# Patient Record
Sex: Male | Born: 1935
Health system: Southern US, Community
[De-identification: ages and names within clinical notes are randomized; demographics above are authoritative.]

## PROBLEM LIST (undated history)

## (undated) DIAGNOSIS — K5900 Constipation, unspecified: Secondary | ICD-10-CM

## (undated) DIAGNOSIS — I2510 Atherosclerotic heart disease of native coronary artery without angina pectoris: Secondary | ICD-10-CM

## (undated) DIAGNOSIS — I252 Old myocardial infarction: Secondary | ICD-10-CM

## (undated) DIAGNOSIS — E1121 Type 2 diabetes mellitus with diabetic nephropathy: Secondary | ICD-10-CM

## (undated) DIAGNOSIS — Z860100 Personal history of colon polyps, unspecified: Secondary | ICD-10-CM

## (undated) DIAGNOSIS — R609 Edema, unspecified: Secondary | ICD-10-CM

## (undated) DIAGNOSIS — H409 Unspecified glaucoma: Secondary | ICD-10-CM

## (undated) DIAGNOSIS — I639 Cerebral infarction, unspecified: Secondary | ICD-10-CM

## (undated) DIAGNOSIS — R55 Syncope and collapse: Secondary | ICD-10-CM

## (undated) DIAGNOSIS — R350 Frequency of micturition: Secondary | ICD-10-CM

## (undated) DIAGNOSIS — H3530 Unspecified macular degeneration: Secondary | ICD-10-CM

## (undated) DIAGNOSIS — R351 Nocturia: Secondary | ICD-10-CM

## (undated) DIAGNOSIS — N183 Chronic kidney disease, stage 3 unspecified: Secondary | ICD-10-CM

## (undated) DIAGNOSIS — J189 Pneumonia, unspecified organism: Secondary | ICD-10-CM

## (undated) DIAGNOSIS — E785 Hyperlipidemia, unspecified: Secondary | ICD-10-CM

## (undated) DIAGNOSIS — M2540 Effusion, unspecified joint: Secondary | ICD-10-CM

## (undated) DIAGNOSIS — M545 Low back pain, unspecified: Secondary | ICD-10-CM

## (undated) DIAGNOSIS — I10 Essential (primary) hypertension: Secondary | ICD-10-CM

## (undated) DIAGNOSIS — G4700 Insomnia, unspecified: Secondary | ICD-10-CM

## (undated) DIAGNOSIS — E119 Type 2 diabetes mellitus without complications: Secondary | ICD-10-CM

## (undated) DIAGNOSIS — Z9289 Personal history of other medical treatment: Secondary | ICD-10-CM

## (undated) DIAGNOSIS — G4730 Sleep apnea, unspecified: Secondary | ICD-10-CM

## (undated) DIAGNOSIS — R600 Localized edema: Secondary | ICD-10-CM

## (undated) DIAGNOSIS — G8929 Other chronic pain: Secondary | ICD-10-CM

## (undated) DIAGNOSIS — R091 Pleurisy: Secondary | ICD-10-CM

## (undated) DIAGNOSIS — I5042 Chronic combined systolic (congestive) and diastolic (congestive) heart failure: Secondary | ICD-10-CM

## (undated) DIAGNOSIS — M1990 Unspecified osteoarthritis, unspecified site: Secondary | ICD-10-CM

## (undated) DIAGNOSIS — H548 Legal blindness, as defined in USA: Secondary | ICD-10-CM

## (undated) DIAGNOSIS — M2550 Pain in unspecified joint: Secondary | ICD-10-CM

## (undated) HISTORY — PX: BACK SURGERY: SHX140

## (undated) HISTORY — PX: KNEE ARTHROSCOPY: SUR90

## (undated) HISTORY — PX: CATARACT EXTRACTION W/ INTRAOCULAR LENS  IMPLANT, BILATERAL: SHX1307

## (undated) HISTORY — PX: EYE SURGERY: SHX253

## (undated) HISTORY — PX: INSERTION / PLACEMENT / REVISION NEUROSTIMULATOR: SUR720

## (undated) HISTORY — PX: COLONOSCOPY: SHX174

## (undated) HISTORY — PX: GLAUCOMA SURGERY: SHX656

## (undated) HISTORY — PX: POSTERIOR FUSION LUMBAR SPINE: SUR632

---

## 2006-03-29 ENCOUNTER — Ambulatory Visit (HOSPITAL_COMMUNITY): Admission: RE | Admit: 2006-03-29 | Discharge: 2006-03-29 | Payer: Self-pay | Admitting: Ophthalmology

## 2006-10-29 ENCOUNTER — Encounter: Admission: RE | Admit: 2006-10-29 | Discharge: 2006-10-29 | Payer: Self-pay | Admitting: General Surgery

## 2006-12-04 ENCOUNTER — Inpatient Hospital Stay (HOSPITAL_COMMUNITY): Admission: RE | Admit: 2006-12-04 | Discharge: 2006-12-08 | Payer: Self-pay | Admitting: Orthopedic Surgery

## 2006-12-18 ENCOUNTER — Ambulatory Visit (HOSPITAL_BASED_OUTPATIENT_CLINIC_OR_DEPARTMENT_OTHER): Admission: RE | Admit: 2006-12-18 | Discharge: 2006-12-18 | Payer: Self-pay | Admitting: General Surgery

## 2006-12-18 ENCOUNTER — Encounter (INDEPENDENT_AMBULATORY_CARE_PROVIDER_SITE_OTHER): Payer: Self-pay | Admitting: General Surgery

## 2006-12-28 ENCOUNTER — Inpatient Hospital Stay (HOSPITAL_COMMUNITY): Admission: EM | Admit: 2006-12-28 | Discharge: 2006-12-31 | Payer: Self-pay | Admitting: Emergency Medicine

## 2007-05-15 ENCOUNTER — Ambulatory Visit (HOSPITAL_COMMUNITY): Admission: RE | Admit: 2007-05-15 | Discharge: 2007-05-15 | Payer: Self-pay | Admitting: Ophthalmology

## 2008-05-24 ENCOUNTER — Ambulatory Visit (HOSPITAL_COMMUNITY): Admission: RE | Admit: 2008-05-24 | Discharge: 2008-05-24 | Payer: Self-pay | Admitting: Ophthalmology

## 2008-08-20 ENCOUNTER — Encounter (HOSPITAL_BASED_OUTPATIENT_CLINIC_OR_DEPARTMENT_OTHER): Admission: RE | Admit: 2008-08-20 | Discharge: 2008-11-18 | Payer: Self-pay | Admitting: Internal Medicine

## 2008-09-07 ENCOUNTER — Ambulatory Visit (HOSPITAL_COMMUNITY): Admission: RE | Admit: 2008-09-07 | Discharge: 2008-09-07 | Payer: Self-pay | Admitting: Ophthalmology

## 2009-01-19 ENCOUNTER — Encounter (HOSPITAL_BASED_OUTPATIENT_CLINIC_OR_DEPARTMENT_OTHER): Admission: RE | Admit: 2009-01-19 | Discharge: 2009-02-09 | Payer: Self-pay | Admitting: Internal Medicine

## 2009-05-19 ENCOUNTER — Ambulatory Visit (HOSPITAL_COMMUNITY): Admission: RE | Admit: 2009-05-19 | Discharge: 2009-05-19 | Payer: Self-pay | Admitting: Ophthalmology

## 2009-08-25 ENCOUNTER — Encounter
Admission: RE | Admit: 2009-08-25 | Discharge: 2009-08-25 | Payer: Self-pay | Admitting: Physical Medicine and Rehabilitation

## 2009-09-23 ENCOUNTER — Inpatient Hospital Stay (HOSPITAL_COMMUNITY): Admission: EM | Admit: 2009-09-23 | Discharge: 2009-09-25 | Payer: Self-pay | Admitting: Emergency Medicine

## 2009-09-28 ENCOUNTER — Encounter: Admission: RE | Admit: 2009-09-28 | Discharge: 2009-09-28 | Payer: Self-pay | Admitting: Family Medicine

## 2010-07-11 ENCOUNTER — Other Ambulatory Visit: Payer: Self-pay | Admitting: Family Medicine

## 2010-08-22 LAB — COMPREHENSIVE METABOLIC PANEL
ALT: 19 U/L (ref 0–53)
ALT: 21 U/L (ref 0–53)
AST: 22 U/L (ref 0–37)
AST: 47 U/L — ABNORMAL HIGH (ref 0–37)
Albumin: 3 g/dL — ABNORMAL LOW (ref 3.5–5.2)
Albumin: 3.4 g/dL — ABNORMAL LOW (ref 3.5–5.2)
Alkaline Phosphatase: 45 U/L (ref 39–117)
Alkaline Phosphatase: 51 U/L (ref 39–117)
BUN: 32 mg/dL — ABNORMAL HIGH (ref 6–23)
BUN: 33 mg/dL — ABNORMAL HIGH (ref 6–23)
CO2: 24 mEq/L (ref 19–32)
CO2: 29 mEq/L (ref 19–32)
Calcium: 8.6 mg/dL (ref 8.4–10.5)
Calcium: 8.9 mg/dL (ref 8.4–10.5)
Chloride: 103 mEq/L (ref 96–112)
Chloride: 103 mEq/L (ref 96–112)
Creatinine, Ser: 1.73 mg/dL — ABNORMAL HIGH (ref 0.4–1.5)
Creatinine, Ser: 1.89 mg/dL — ABNORMAL HIGH (ref 0.4–1.5)
GFR calc Af Amer: 42 mL/min — ABNORMAL LOW (ref >60)
GFR calc Af Amer: 47 mL/min — ABNORMAL LOW (ref >60)
GFR calc non Af Amer: 35 mL/min — ABNORMAL LOW (ref >60)
GFR calc non Af Amer: 39 mL/min — ABNORMAL LOW (ref >60)
Glucose, Bld: 117 mg/dL — ABNORMAL HIGH (ref 70–99)
Glucose, Bld: 148 mg/dL — ABNORMAL HIGH (ref 70–99)
Potassium: 3.5 mEq/L (ref 3.5–5.1)
Potassium: 3.8 mEq/L (ref 3.5–5.1)
Sodium: 137 mEq/L (ref 135–145)
Sodium: 139 mEq/L (ref 135–145)
Total Bilirubin: 0.5 mg/dL (ref 0.3–1.2)
Total Bilirubin: 1 mg/dL (ref 0.3–1.2)
Total Protein: 6.4 g/dL (ref 6.0–8.3)
Total Protein: 6.8 g/dL (ref 6.0–8.3)

## 2010-08-22 LAB — TESTOSTERONE: Testosterone: 186.87 ng/dL — ABNORMAL LOW (ref 350–890)

## 2010-08-22 LAB — URINALYSIS, ROUTINE W REFLEX MICROSCOPIC
Bilirubin Urine: NEGATIVE
Glucose, UA: NEGATIVE mg/dL
Nitrite: POSITIVE — AB
Protein, ur: 30 mg/dL — AB
Specific Gravity, Urine: 1.02 (ref 1.005–1.030)
Urobilinogen, UA: 0.2 mg/dL (ref 0.0–1.0)
pH: 5 (ref 5.0–8.0)

## 2010-08-22 LAB — HEMOGLOBIN A1C
Hgb A1c MFr Bld: 7.4 % — ABNORMAL HIGH (ref <5.7)
Mean Plasma Glucose: 166 mg/dL — ABNORMAL HIGH (ref <117)

## 2010-08-22 LAB — CULTURE, BLOOD (ROUTINE X 2)
Culture: NO GROWTH
Culture: NO GROWTH

## 2010-08-22 LAB — GLUCOSE, CAPILLARY
Glucose-Capillary: 124 mg/dL — ABNORMAL HIGH (ref 70–99)
Glucose-Capillary: 124 mg/dL — ABNORMAL HIGH (ref 70–99)
Glucose-Capillary: 134 mg/dL — ABNORMAL HIGH (ref 70–99)
Glucose-Capillary: 156 mg/dL — ABNORMAL HIGH (ref 70–99)

## 2010-08-22 LAB — DIFFERENTIAL
Basophils Absolute: 0 10*3/uL (ref 0.0–0.1)
Basophils Relative: 0 % (ref 0–1)
Eosinophils Absolute: 0 10*3/uL (ref 0.0–0.7)
Eosinophils Relative: 0 % (ref 0–5)
Lymphocytes Relative: 3 % — ABNORMAL LOW (ref 12–46)
Lymphs Abs: 0.2 10*3/uL — ABNORMAL LOW (ref 0.7–4.0)
Monocytes Absolute: 0.3 10*3/uL (ref 0.1–1.0)
Monocytes Relative: 7 % (ref 3–12)
Neutro Abs: 4.6 10*3/uL (ref 1.7–7.7)
Neutrophils Relative %: 90 % — ABNORMAL HIGH (ref 43–77)

## 2010-08-22 LAB — CBC
HCT: 35.9 % — ABNORMAL LOW (ref 39.0–52.0)
HCT: 38.8 % — ABNORMAL LOW (ref 39.0–52.0)
Hemoglobin: 11.9 g/dL — ABNORMAL LOW (ref 13.0–17.0)
Hemoglobin: 12.9 g/dL — ABNORMAL LOW (ref 13.0–17.0)
MCHC: 33.1 g/dL (ref 30.0–36.0)
MCHC: 33.4 g/dL (ref 30.0–36.0)
MCV: 89.9 fL (ref 78.0–100.0)
MCV: 90.9 fL (ref 78.0–100.0)
Platelets: 116 10*3/uL — ABNORMAL LOW (ref 150–400)
Platelets: 141 10*3/uL — ABNORMAL LOW (ref 150–400)
RBC: 3.95 MIL/uL — ABNORMAL LOW (ref 4.22–5.81)
RBC: 4.31 MIL/uL (ref 4.22–5.81)
RDW: 13.7 % (ref 11.5–15.5)
RDW: 13.8 % (ref 11.5–15.5)
WBC: 5.2 10*3/uL (ref 4.0–10.5)
WBC: 8.4 10*3/uL (ref 4.0–10.5)

## 2010-08-22 LAB — HOMOCYSTEINE: Homocysteine: 12.1 umol/L (ref 4.0–15.4)

## 2010-08-22 LAB — URINE CULTURE: Colony Count: >=100000

## 2010-08-22 LAB — BASIC METABOLIC PANEL
BUN: 22 mg/dL (ref 6–23)
CO2: 28 mEq/L (ref 19–32)
Calcium: 8.6 mg/dL (ref 8.4–10.5)
Chloride: 104 mEq/L (ref 96–112)
Creatinine, Ser: 1.28 mg/dL (ref 0.4–1.5)
GFR calc Af Amer: >60 mL/min (ref >60)
GFR calc non Af Amer: 55 mL/min — ABNORMAL LOW (ref >60)
Glucose, Bld: 130 mg/dL — ABNORMAL HIGH (ref 70–99)
Potassium: 3.6 mEq/L (ref 3.5–5.1)
Sodium: 140 mEq/L (ref 135–145)

## 2010-08-22 LAB — RETICULOCYTES
RBC.: 4.22 MIL/uL (ref 4.22–5.81)
Retic Count, Absolute: 29.5 10*3/uL (ref 19.0–186.0)
Retic Ct Pct: 0.7 % (ref 0.4–3.1)

## 2010-08-22 LAB — URINE MICROSCOPIC-ADD ON

## 2010-08-22 LAB — INTRINSIC FACTOR ANTIBODIES: Intrinsic Factor: NEGATIVE

## 2010-08-22 LAB — VITAMIN B12: Vitamin B-12: 174 pg/mL — ABNORMAL LOW (ref 211–911)

## 2010-08-22 LAB — METHYLMALONIC ACID, SERUM: Methylmalonic Acid, Quantitative: 308 nmol/L (ref 87–318)

## 2010-08-22 LAB — LACTIC ACID, PLASMA: Lactic Acid, Venous: 1 mmol/L (ref 0.5–2.2)

## 2010-08-22 LAB — IRON AND TIBC
Iron: 23 ug/dL — ABNORMAL LOW (ref 42–135)
Saturation Ratios: 10 % — ABNORMAL LOW (ref 20–55)
TIBC: 231 ug/dL (ref 215–435)
UIBC: 208 ug/dL

## 2010-08-22 LAB — FOLATE: Folate: 15.1 ng/mL

## 2010-08-22 LAB — FERRITIN: Ferritin: 1099 ng/mL — ABNORMAL HIGH (ref 22–322)

## 2010-08-22 LAB — HEMOCCULT GUIAC POC 1CARD (OFFICE): Fecal Occult Bld: NEGATIVE

## 2010-08-22 LAB — CREATININE, URINE, RANDOM: Creatinine, Urine: 139.7 mg/dL

## 2010-08-22 LAB — ANTI-PARIETAL ANTIBODY: Parietal Cell Antibody-IgG: <=20 Unit (ref <=20.0)

## 2010-08-22 LAB — SODIUM, URINE, RANDOM: Sodium, Ur: 62 mEq/L

## 2010-09-04 LAB — BASIC METABOLIC PANEL
BUN: 19 mg/dL (ref 6–23)
CO2: 30 mEq/L (ref 19–32)
Calcium: 9.4 mg/dL (ref 8.4–10.5)
Chloride: 98 mEq/L (ref 96–112)
Creatinine, Ser: 1.12 mg/dL (ref 0.4–1.5)
GFR calc Af Amer: >60 mL/min (ref >60)
GFR calc non Af Amer: >60 mL/min (ref >60)
Glucose, Bld: 156 mg/dL — ABNORMAL HIGH (ref 70–99)
Potassium: 4.1 mEq/L (ref 3.5–5.1)
Sodium: 136 mEq/L (ref 135–145)

## 2010-09-04 LAB — CBC
HCT: 43.8 % (ref 39.0–52.0)
Hemoglobin: 14.8 g/dL (ref 13.0–17.0)
MCHC: 33.8 g/dL (ref 30.0–36.0)
MCV: 90.9 fL (ref 78.0–100.0)
Platelets: 181 10*3/uL (ref 150–400)
RBC: 4.81 MIL/uL (ref 4.22–5.81)
RDW: 13.3 % (ref 11.5–15.5)
WBC: 4.4 10*3/uL (ref 4.0–10.5)

## 2010-10-17 NOTE — Assessment & Plan Note (Signed)
[facility]   NAME:  ALLAN, MINOTTI             ACCOUNT NO.:  [id number]   MEDICAL RECORD NO.:  [id number]      DATE OF BIRTH:  [birth year]   PHYSICIAN:  Maxwell Caul, M.D. VISIT DATE:  08/23/2008                                   OFFICE VISIT   Mr. Richens is a pleasant 77 year old man, who [facility] for followup of a wound on his right lateral  heel/foot.  The patient tells me that his problem began 2 months ago.  He developed a small area on his right lateral foot.  This was not  particularly painful.  However, it progressed over the course of the  month to a fairly large open wound.  There was swelling and drainage at  the time.  He is not aware of what could have precipitated this.  He  specifically denies any trauma to the foot.  No particular trauma with  new foot wear, etc.  He was not persistently recumbent and there is no  reason to believe this was a decubitus ulcer.  He is not a diabetic.  He  applied various topical agents to this most recently, Bactroban and he  gradually got this to close over.  Roughly 2 weeks ago, he noted a very  pruritic rash predominately on his bilateral thighs and more recently on  his arms.  He has not been on any different medications.  Again, this is  not painful.  He has no joint problems and it does not particularly have  any new arthritis present.  He has no kidney problems that he is aware  of.   PAST MEDICAL HISTORY:  1. Chronic low back pain.  2. Hypertension.  3. Hyperlipidemia.  As mentioned, he is not a diabetic.  4. He has had a prior history of lumbar back surgery.   SOCIAL HISTORY:  The patient is not a smoker or alcohol use.   ALLERGIES:  He has no known medical allergies.   CURRENT MEDICATIONS:  1. Benicar/hydrochlorothiazide.  2. Voltaren.  3. Lovaza.  4. Neurontin (for chronic back pain).  5. Pravastatin 40 mg per day.  None of these are new additions.   PHYSICAL EXAMINATION:  VITAL SIGNS:  Temperature is 98.3, pulse 84,  respirations 16, and blood pressure 141/93.  RESPIRATORY:  Clear air entry bilaterally.  CARDIAC:  Heart sounds are normal.  There are no murmurs.  MUSCULOSKELETAL:  No evidence of active arthritis.  SKIN:  He indeed has a fine raised rash over the anterior parts of both  his thighs anterior and medially.  This is also present in his arms to a  lesser extent.  I did not appreciate this on his abdomen, chest, or  back.   Also, of interest, he has what looks to be a healed wound on the right  lateral heel.  This is fully epithelialized.  There is some darkish  discoloration in the middle aspect of this wound.  I am really not  certain whether this is hematoma at one point or it could represent  something more ominous than this, although for now, I am going to call  this healed ulcer of uncertain etiology.   Circulation, I could not feel his  dorsalis pedis, but his posterior  tibial pulses were brisk, and he has good capillary refill time.   IMPRESSION:  1. Right heel ulcer of completely uncertain etiology.  Reviewing this      with him historically, I just could not determine what this could      be or could have been.  The area is completely epithelialized,      although I am hesitant to call this totally resolved because of the      discoloration under the epithelial layer.  2. Extremely pruritic rash in his legs and to a lesser extent his      arms.  This does not look like a vasculitic rash and historically      only occurred 2 weeks ago.  This looks more like some form of      allergic reaction to me and/or some form of eczema rather than      anything that could have been connected in terms of pathophysiology      with the heel wound.  Nevertheless, I thought it prudent to have      this reviewed by a dermatologist, then he has been referred to see      Dr. Doristine Section of Dermatology.   I have made this  gentleman on appointment in 2 weeks' time to follow up  with the area on his right lateral heel.  Hopefully by that time, we  will have Dr. Joseph Art' input about the nature of his pruritic rash on his  legs and arms to make sure that he does not feel that there is any  connection between that and what was a fairly large wound on his right  lateral foot.  I did not recommend any specific dressing other than to  protect this area on his foot with a dry dressing at this point.           ______________________________  Maxwell Caul, M.D.     MGR/MEDQ  D:  08/23/2008  T:  08/23/2008  Job:  [id number]   cc:   Knox Royalty, MD

## 2010-10-17 NOTE — Op Note (Signed)
[name], [name]             ACCOUNT NO.:  [id number]   MEDICAL RECORD NO.:  [id number]          PATIENT TYPE:  INP   LOCATION:  5034                         FACILITY:  [facility]   PHYSICIAN:  Nelda Severe, MD      DATE OF BIRTH:  1930/09/02   DATE OF PROCEDURE:  12/04/2006  DATE OF DISCHARGE:                               OPERATIVE REPORT   SURGEON:  Dr. Nelda Severe.   ASSISTANT:  Lianne Cure, PA-C   PREOPERATIVE DIAGNOSIS:  L3-4 disk herniation right side, L4-5 spinal  stenosis.   POSTOPERATIVE DIAGNOSIS:  L3-4 lateral stenosis, foraminal stenosis; L4-  5 spinal stenosis.   OPERATIVE PROCEDURE:  Decompression right L3 and L4 nerve roots at L3-4  and L4-5 neural foramina.  Incidental right L5-S1 laminar foraminotomy  (see below).   The patient was placed under general endotracheal anesthesia.  A Foley  catheter was placed in the bladder.  Intravenous antibiotics were  administered prophylactically.  Sequential compression devices were  placed on both lower extremities.   The patient was positioned prone on Wagon Wheel table.  Care was taken to  position the upper extremities so as to avoid hyperflexion and abduction  of the shoulders and so as to avoid hyperflexion of the elbows.  The  upper extremities were padded with foam from axilla to hands.  Thighs,  knees, shins and ankles were supported on pillows.  The lumbar area was  prepped with DuraPrep and draped in square fashion.  The drapes were  secured with Ioban.   The lower lumbar incision was made a little right to the midline.  The  subcutaneous tissue paraspinal muscles were injected with a mixture of  quarter percent Marcaine with 1% lidocaine with epinephrine.  Dissection  was carried down through the thoracolumbar fascia and a very large  supraspinatus muscle bulged through the fascia.  I had intended to try  to approach the L3-4 level extraforaminally, the bulk of this muscle was  so much that I could not  really retract it medially.  Therefore we  separated the muscle medially from the spinous processes of the lower  lumbar spine.  Cross-table lateral radiograph was taken with a Kocher on  the spinous process of L4 for localization.   Subsequently a lamina foraminotomy and all of the facette joint  resection was done at what I perceived to be L4-5, but in fact was L3-4.  I could not identify the disk herniation at that level.  The L3 nerve  was completely decompressed.  I then moved one level proximally, still  in the belief that I had performed a laminar foraminotomy at L4-5, when  in fact it was L3-4.  I approached the level proximally extra  foraminally and cleared away soft tissue from between the transverse  processes of the adjacent vertebra and identified a nerve root at a disk  which did not appear to be herniated.  A needle was placed in the disk  and a cross-table lateral radiograph taken and this turned out to be L2-  3 disk, and at this point, I realized that my laminar  foraminotomy,  described above, was in fact at L3-4.  To verify this a needle was  placed in what turned out to be the L3-4 disk and a x-ray taken.   I then moved to do a laminar foraminotomy at L4-5 distally.  This was  accomplished without difficulty using a high-speed bur, Kerrison  rongeurs etc. but as the lateral foraminotomy was enlarged it became  apparent to me that we appeared to be at the last mobile segment.  X-ray  was taken which confirmed this.   In fact the situation was that the facette joint at L4-5 was overgrown  that I could not actually perceive the joint.  Ultimately the  laminectomy was extended from the L3-4 level through the 4 lamina and  down into L5.  A laminar foraminotomy was then performed L4-5 and  another radiograph taken to confirm the level.   Therefore in summary, at L2-3 an extraforaminal exposure of the disk was  performed, but nothing was really done.  L3-4 a right-sided  laminectomy  and facetectomy were performed and no disk pathology, other than a  bulging disk identified.  L4-5 laminar foraminotomy was performed and at  L5-S1 lateral foraminotomy was also performed.   There was a fair amount of oozing throughout the procedure, no real  epidural bleeding.  All identifiable bleeders were coagulated with  bipolar coagulation.  We placed a #15 gauge Blake drain in the sub  fascial layer.  The thoracolumbar fascia was reapposed using interrupted  figure-of-eight #1 Vicryl sutures and a one-eighth inch Hemovac drain  placed in the subcutaneous layer.  The subcutaneous layer was closed  using 2-0 interrupted Vicryl sutures.  Skin was closed using  subcuticular 3-0 undyed Vicryl in running fashion.  Both drains were  secured with 2-0 nylon in basket weave fashion.  The skin edges were  reinforced with Steri-Strips.  A nonadherent antibiotic ointment  dressing was applied and secured with OpSite.   Blood loss estimated at 200 to 300 mL.   Sponge and needle counts were correct.   Complications outlined was essentially inadvertent - incidental laminar  foraminotomy right L5-S1 without technical complication.      Nelda Severe, MD  Electronically Signed     MT/MEDQ  D:  12/04/2006  T:  12/05/2006  Job:  [id number]

## 2010-10-17 NOTE — Op Note (Signed)
NAMEJAHEL, WAVRA             ACCOUNT NO.:  [id number]   MEDICAL RECORD NO.:  [id number]          PATIENT TYPE:  AMB   LOCATION:  DSC                          FACILITY:  [facility]   PHYSICIAN:  Sharlet Salina T. Hoxworth, M.D.DATE OF BIRTH:  [birth year]   DATE OF PROCEDURE:  12/18/2006  DATE OF DISCHARGE:                               OPERATIVE REPORT   POSTOPERATIVE DIAGNOSIS:  Soft tissue mass, probable lipoma, posterior  neck.   SURGICAL PROCEDURE:  Excision soft tissue mass, posterior neck.   SURGEON:  Lorne Skeens. Hoxworth, M.D.   ANESTHESIA:  Local.   BRIEF HISTORY:  Mr. Lemmerman is a 77 year old male who presents with a  gradually enlarging and now uncomfortable subcutaneous lump just behind  the left ear overlying the mastoid head of the sternocleidomastoid  muscle.  Exam shows a 2.5 cm for fleshy discrete mass consistent with  lipoma.  After discussion we have elected proceed with excision under  local anesthesia.  Risks of bleeding, infection were discussed  understood.   DESCRIPTION OF OPERATION:  Patient brought to minor suite and positioned  comfortably right side down, the left neck sterilely prepped and draped.  Local anesthesia was used infiltrate the skin and underlying soft  tissue.  Made an incision in a skin crease directly overlying the mass  and dissection was carried down to the deep subcutaneous tissue.  There  was a lipomatous mass in this area but it was not particularly discrete,  multilobulated.  It was sharply dissected away from subcutaneous tissue  down to the level of the muscular fascia.  It was fairly adherent to the  fascia and was sharply dissected off of this and completely excised.  Complete hemostasis was assured.  The subcu was closed with interrupted  Monocryl and the skin closed with running subcuticular 4-0 Monocryl and  Dermabond.  The patient tolerated procedure well.      Lorne Skeens. Hoxworth, M.D.  Electronically Signed     BTH/MEDQ  D:  12/18/2006  T:  12/19/2006  Job:  [id number]

## 2010-10-17 NOTE — H&P (Signed)
[name], [name]             ACCOUNT NO.:  [id number]   MEDICAL RECORD NO.:  [id number]          PATIENT TYPE:  INP   LOCATION:  1528                         [facility]ACILITY:  [facility]   PHYSICIAN:  Hillery Aldo, M.D.   DATE OF BIRTH:  Nov 18, 1930   DATE OF ADMISSION:  12/28/2006  DATE OF DISCHARGE:                              HISTORY & PHYSICAL   PRIMARY CARE PHYSICIAN:  The patient is unassigned.   CHIEF COMPLAINT:  Weakness, dysuria, fever.   HISTORY OF PRESENT ILLNESS:  The patient is a 77 year old male who was  recently in the hospital on 12/18/2006 for excision of the soft tissue  mass that turned out to be a lipoma.  He was recuperating well  postoperatively but over the past several days, has experienced  increasing weakness and lethargy with diminished appetite.  He also has  developed some dysuria and had two episodes of urinary incontinence this  evening.  The patient's family became concerned when he developed  violent shaking chills and brought him to the ER for further evaluation.  Upon initial evaluation, he is found to have a fever of 104 degrees and  significant pyuria on urinalysis.  He is being admitted for  pyelonephritis with early sepsis.   PAST MEDICAL HISTORY:  1. Hypertension.  2. Spinal stenosis status post decompression surgery to L3/L4.  3. Glaucoma.  4. Cataracts status post lens implantation on the right  5. Remote right knee surgery.   FAMILY HISTORY:  The patient's mother died in her 8s from colon cancer.  The patient does not have any information regarding his father's medical  history but he is deceased.  He has two healthy sisters.  He has eight  children, two adopted, all healthy.   SOCIAL HISTORY:  The patient is married, lives with his spouse.  He has  a history of remote tobacco use, quit smoking over 20 years ago.  He was  a heavy user up until that time, smoking two to three packs daily.  He  denies any current alcohol use.  He  currently works full time with a IT sales professional.   ALLERGIES:  NO KNOWN DRUG ALLERGIES.   CURRENT MEDICATIONS:  1. Benicar 40/25 1 tablet daily.  2. Vicodin, 1-2 tablets q.4 h p.r.n.  3. Lyrica 75 mg daily  4. Cosopt 1 drop OU b.i.d.  5. Methocarbamol 500 mg 1-2 tablets q.6 h p.r.n.  6. Nabumetone  750 mg b.i.d.  7. Lumigan 1 drop in the right eye b.i.d.   REVIEW OF SYSTEMS:  The patient endorses fever and violently shaking  chills.  Diminished appetite.  No nausea or vomiting.  No chest pain,  shortness of breath or cough.  He has had some mild constipation.  No  melena or hematochezia.  He has had some dysuria and decreased energy  levels.   PHYSICAL EXAM:  T-max 104, T-current 100.9.  Pulse 118.  Respirations  20.  Blood pressure 104/62.  O2 saturation 94% on room air.  GENERAL:  A well-developed, well-nourished obese male in no acute  distress.  HEENT: Normocephalic, atraumatic.  PERRL.  EOMI.  Oropharynx is clear.  NECK:  Supple, no thyromegaly, no lymphadenopathy, no jugular venous  distension.  Surgical site well healed with wound edges well  approximated.  CHEST:  Lungs clear to auscultation bilaterally with good  air movement.  HEART:  Tachycardiac rate, regular rhythm.  No murmurs, rubs or gallops.  ABDOMEN:  Soft, nontender, nondistended.  Normoactive bowel sounds.  No  appreciable flank tenderness.  GENITOURINARY:  Unremarkable.  EXTREMITIES:  No clubbing, edema, cyanosis.  SKIN:  Diaphoretic and hot to touch.  NEUROLOGIC:  The patient is mildly disoriented.  Exam otherwise  nonfocal.   DATA REVIEW:  Chest x-ray shows borderline cardiac enlargement.  There  is mild chronic bronchitic changes and chronic accentuation of basilar  markings.  Stable.   LABORATORY DATA:  White blood cell count of 6.0, hemoglobin of 11.9,  hematocrit 34.7, platelets 218 with absolute neutrophil count of 5.3 and  MCV of 85.5.  Sodium is 135, potassium 3.7, chloride 102, bicarb 26,  BUN  36, creatinine 1.55, glucose 133.  Urinalysis is positive for nitrites  with a large amount of leukocyte esterase.  There are too numerous to  count white blood cells, three to six red blood cells and many bacteria.   ASSESSMENT/PLAN:  1. Pyelonephritis with early sepsis:  We will admit the patient for IV      antibiotics.  He has been started on Rocephin in the emergency      department which we will continue pending culture and sensitivity      data.  Given his relative hypotension, will hold his      antihypertensive treatment.  Will administer Tylenol for fevers      p.r.n..  Will follow up with his cultures and follow his clinical      course closely.  2. Hypertension:  Again, the patient is currently relatively      hypotensive.  Will start him on IV fluid rehydration and hold his      antihypertensive medications.  3. Mild normocytic anemia:  The patient has a mild normocytic anemia.      Will defer workup for this to his regular physician.  4. Glaucoma:  Continue the patient's eyedrops.  5. Prophylaxis:  Initiate GI prophylaxis with Protonix and DVT      prophylaxis with Lovenox.      Hillery Aldo, M.D.  Electronically Signed     CR/MEDQ  D:  12/28/2006  T:  12/29/2006  Job:  [id number]

## 2010-10-17 NOTE — Discharge Summary (Signed)
NAMEJAZON, [name]             ACCOUNT NO.:  [id number]   MEDICAL RECORD NO.:  [id number]          PATIENT TYPE:  INP   LOCATION:  1528                         [facility]AC[facility]:  [facility]   PHYSICIAN:  Beckey Rutter, MD  DATE OF BIRTH:  [birth year]   DATE OF ADMISSION:  12/28/2006  DATE OF DISCHARGE:  12/31/2006                               DISCHARGE SUMMARY   CHIEF COMPLAINT:  Weakness, dysuria, and fevers.   HISTORY OF PRESENT ILLNESS:  Briefly this is a 77 year old admitted for  sepsis and UTI.   HOSPITAL COURSE:  1,  SEPSIS, RESOLVED.  The patient was continued on  antibiotic ciprofloxacin.  His urine culture, the one first sent on the  day of admission, was not obtainable, and the repeat culture was showing  negative results after three days of antibiotic.  The patient is still  for discharge today to continue on antibiotic.  Will switch his  antibiotic to Augmentin for five more days.  1. HYPERTENSION, STABLE.  Mild normocytic anemia is stable during this      hospitalization.  2. GLAUCOMA.  The patient will be discharged to continue on his eye      drops as before presentation.   DISCHARGE DIAGNOSES:  1. Sepsis, likely secondary to pyelonephritis.  2. Hypertension.  3. Mild normocytic anemia.  4. Glaucoma.  5. Overweight.   DISCHARGE MEDICATIONS:  1. Augmentin 875 mg p.o. b.i.d. for five days.  2. Benicar 40/25 mg p.o. daily.  3. Vicodin one capsule p.r.n.  4. Lyrica 75 mg daily.  5. Cosopt eye drops b.i.d.  6. Methocarbamol 500 mg every six hours p.r.n.  7. Lumigan one drop each eye.  8. Nabumetone 750 mg twice a day.   PLAN:  The patient was discharged today to follow up with his primary as  discussed.      Beckey Rutter, MD  Electronically Signed     EME/MEDQ  D:  12/31/2006  T:  01/01/2007  Job:  [id number]

## 2010-10-17 NOTE — Assessment & Plan Note (Signed)
[facility]   NAME:  MATTIAS, WALMSLEY             ACCOUNT NO.:  [id number]   MEDICAL RECORD NO.:  [id number]      DATE OF BIRTH:  Feb 09, 1931   PHYSICIAN:  Maxwell Caul, M.D. VISIT DATE:  01/20/2009                                   OFFICE VISIT   Mr. Wainer is a gentleman we saw last week.  He presented with a wound  on his right lateral foot.  He also had an intensely pruritic rash over  the anterior parts of both his thighs and to a lesser extend his arms.  The wound when I saw him last week on the right lateral foot had healed.  It was fully epithelialized.   I sent the patient to see Dermatology.  He was kindly seen by Dr. Joseph Art.  The patient was felt to have nummular eczema as well as lichen simplex  chronicus.  He was prescribed a tapering course of prednisone from 20  mg, also triamcinolone.   On examination, his rash on his legs is considerably better.  We did  give him some Ultra Mide to use as well which might be helpful.  The  wound on his right lateral foot has continued to epithelialize and I  would call this resolved at this point.   IMPRESSION:  1. Wound right lateral foot.  This was probably a primary skin problem      with perhaps trauma from scratching.  However, the area has      completely resolved at this point.  2. Primary skin diagnosis as noted above.  This seems much improved.           ______________________________  Maxwell Caul, M.D.     MGR/MEDQ  D:  01/20/2009  T:  01/21/2009  Job:  [id number]

## 2010-10-20 NOTE — Discharge Summary (Signed)
[name], [name]             ACCOUNT NO.:  [id number]   MEDICAL RECORD NO.:  [id number]          PATIENT TYPE:  INP   LOCATION:  1528                         [facility]ACILITY:  [facility]   PHYSICIAN:  Beckey Rutter, MD  DATE OF BIRTH:  30-May-1931   DATE OF ADMISSION:  12/28/2006  DATE OF DISCHARGE:  12/31/2006                               DISCHARGE SUMMARY   [name] was admitted to [facility] for surgery by Dr.  Alveda Reasons on December 04, 2006.   ADMITTING DIAGNOSIS:  L3-4 disc herniation.  L4-5 stenosis.   SURGICAL PROCEDURE:  Right L4-5 and L3-4 laminectomy with foraminotomy  and right foraminotomy at L5-S1.   BRIEF HISTORY OF STAY:  Postoperatively the patient was stable and  afebrile with active range of motion of bilateral lower extremities in  the postoperative care unit.  Hemoglobin stable at 12.4, white count  6.1, basic metabolic panel within normal limits.  The patient stated  that the right leg is feeling tingly but not painful.  Potassium 3.4.  Postoperative day 2 the patient was doing well with no new complaints.  Hemoglobin stable at 12.6, white count 6.9, potassium 3.4, afebrile.  Active range of motion and strength intact grossly.  Bilateral lower  extremities equal.  Incision site clean and dry.  Drain was  discontinued.  Calves were soft and nontender.  PCA was discontinued.  IV fluids were hep-locked.  We discontinued the Foley.  Postoperative  day 3 the patient has tingling in his legs, nonspecific to nerve pattern  and it is an on and off sensation.  He is afebrile.  Hemoglobin stable  at 12.2.  White count 6.3.  Potassium 3.3, basic metabolic within normal  limits.  Incision is clean, dry and intact.  No active drainage.  No  abnormal erythema.  Distally, bilateral lower extremities grossly  neurovascularly motor intact.  We did have a consult with physical  therapy.  The patient is independent and safe with ambulation and  mobility.  On December 08, 2006 the  patient was stable, afebrile, independent  with bed mobility and ambulation.  Incision is clean, dry and intact.  Distally neurovascularly motor intact bilateral lower extremities.  [facility] was set up for Durable Medical Equipment to be  delivered to the patient's home.  The patient was discharged home in a  stable condition.   DISCHARGE DIAGNOSES:  Status post lumbar laminectomy at L3-4, L4-5 and  L5-S1 on the right side.   PREOPERATIVE LABS:  Include a white cell count of 4.4 on November 05, 2006,  hemoglobin 14.9, hematocrit 44, platelet count 205.  Sodium on December 02, 2006 was 141, potassium 3.9, chloride 103, CO2 32, glucose 104, BUN 16  and creatinine 0.9   DISCHARGE PLAN:  He is being sent home with pain medication Norco 10/325  mg 1 to 2 every 4 hours as needed for pain control.  He is going to  followup with Dr. Nelda Severe in the office in approximately 4 weeks.  We also  gave him prescription for Robaxin 500 mg 1 to 2 every  6 hours p.r.n. for  muscle spasms.  He is going to continue with his preoperative home  medications that he was on prior to his surgical procedure.  He is going  to walk for exercise.  No bending, stooping or squatting.  Diet is  regular.  No dressings needed.  He may shower.      Lianne Cure, P.A.      Beckey Rutter, MD  Electronically Signed    MC/MEDQ  D:  01/31/2007  T:  01/31/2007  Job:  [id number]

## 2010-10-20 NOTE — Op Note (Signed)
[name], [name]             ACCOUNT NO.:  [id number]   MEDICAL RECORD NO.:  [id number]          PATIENT TYPE:  AMB   LOCATION:  SDS                          FACILITY:  [facility]   PHYSICIAN:  Salley Scarlet., M.D.DATE OF BIRTH:  [birth year]   DATE OF PROCEDURE:  05/17/2007  DATE OF DISCHARGE:  05/15/2007                               OPERATIVE REPORT   PREOPERATIVE DIAGNOSIS:  Immature cataract, left eye.   POSTOPERATIVE DIAGNOSIS:  Immature cataract, left eye.   OPERATION:  Kelman phacoemulsification of cataract, left eye.   ANESTHESIA:  Local using Xylocaine 2% with Marcaine 0.75% and Wydase.   JUSTIFICATION FOR PROCEDURE:  This is a 77 year old gentleman who  underwent a cataract extraction of the right eye approximately a year or  2 ago with good visual result.  However, he is still complaining of  difficulty seeing to read and drive at night.  He was evaluated and  found to have a visual acuity best corrected to 20/50 on the left with a  posterior subcapsular cataract present.  Cataract extraction with  intraocular lens implantation was recommended.  He is admitted at this  time for that purpose.   PROCEDURE:  Under the influence of IV sedation, the Van Lint akinesia  and retrobulbar anesthesia was given.  The patient was prepped and  draped in the usual manner.  The lid speculum was inserted under the  upper and lower lid of the left eye and a 4-0 silk traction suture was  passed through the belly of the superior rectus muscle for traction.  A  fornix-based conjunctival flap was turned and hemostasis was achieved by  using cautery.  An incision was made in the sclera at the limbus.  This  incision was dissected down to clear cornea using a crescent blade.  A  sideport incision was made at the 1:30 o'clock position.  OcuCoat was  injected into the eye through the sideport incision.  The anterior  chamber was then entered through the corneoscleral tunnel incision at  the 11:30 o'clock position.  An anterior capsulotomy was done using a  bent 25-gauge needle.  The nucleus was hydrodissected using Xylocaine.  The KEP handpiece was passed into the eye and the nucleus was emulsified  without difficulty.  The residual cortical material was aspirated.  The  posterior capsule was polished using the olive tip polisher.  The wound  was widened slightly to accommodate a foldable silicone lens.  The lens  was seated into the eye behind the iris without difficulty.  The  anterior chamber was reformed and the pupil was constricted using  Miochol.  The lips of the wound were hydrated and tested to make sure  there was no leak.  After ascertaining that there was no leak, the  conjunctiva was closed over the wound using thermal cautery.  One  milliliters of Celestone and 0.5 mL of gentamicin were injected  subconjunctivally.  Maxitrol ophthalmic ointment and Pilopine were  applied along with a patch and Fox shield.  The patient tolerated the  procedure well and was discharged to the postanesthesia recovery  room  in satisfactory condition.  He was instructed to rest today, to  take Vicodin every 4 days as needed for pain and to see me in office  tomorrow for further evaluation.   DISCHARGE DIAGNOSIS:  Immature cataract, left eye.      Salley Scarlet., M.D.  Electronically Signed     TB/MEDQ  D:  05/17/2007  T:  05/19/2007  Job:  [id number]

## 2010-10-20 NOTE — Op Note (Signed)
NAMEKRISTOPHER, [name]             ACCOUNT NO.:  [id number]   MEDICAL RECORD NO.:  [id number]          PATIENT TYPE:  AMB   LOCATION:  SDS                          FACILITY:  [facility]   PHYSICIAN:  Salley Scarlet., M.D.DATE OF BIRTH:  1930/11/13   DATE OF PROCEDURE:  DATE OF DISCHARGE:  03/29/2006                               OPERATIVE REPORT   PREOPERATIVE DIAGNOSIS:  Immature cataract right eye.   POSTOPERATIVE DIAGNOSIS:  Immature cataract right eye.   OPERATION:  Kelman phacoemulsification cataract right eye.   ANESTHESIA:  Local using Xylocaine 2% with Marcaine 0.75% and Wydase.   JUSTIFICATION FOR PROCEDURE:  This is a 77 year old gentleman with a  history of glaucoma who complains of blurring vision with difficulty  seeing to read and drive.  He particularly has trouble seeing to drive  at night and in the bright sunlight.  He was evaluated and found to have  visual acuity best corrected to 20/80 on the right and 20/60 on the  left.  There were bilateral posterior subcapsular cataracts, slightly  worse on the right than the left.  Cataract extraction with intraocular  lens implantation was recommended.  He is admitted at this time for that  purpose.   PROCEDURE:  Under the influence of IV sedation a Val Lint akinesia and  retrobulbar anesthesia were given.  The patient was prepped and draped  in the usual manner.  The lid speculum was inserted under the upper and  lower lid of the right eye, and a 4-0 silk traction suture was passed  through the belly of the superior rectus muscle for traction.  A fornix-  based conjunctival flap was turned, and hemostasis was achieved by using  cautery.  A groove incision made in the cornea at the limbus.  This  incision was dissected down into the cornea using a crescent blade.  A  sideport incision was made at the 1:30 o'clock position.  Ocucoat was  injected into the eye through the sideport incision.  The anterior  chamber was  then entered through the corneoscleral tunnel incision at  the 11:30 o'clock position.  Anterior capsulotomy was done using a bent  25-gauge needle.  The nucleus was hydrodissected using Xylocaine.  The  KPE handpiece was passed into the eye, and the nucleus was emulsified  without difficulty.  The residual cortical material was aspirated.  The  posterior capsule was polished using an olive-tipped polisher.  The  wound was widened slightly to accommodate a foldable silicone lens.  This lens was seated into the eye behind the iris without difficulty.  The anterior chamber was re-formed and the pupils constricted using  Miochol.  The residual ocular was aspirated from the eye.  The lips of  the wound were hydrated and tested to make sure that there was no leak.  After ascertaining that there was no leak, the conjunctiva was closed  using thermal cautery.  One cc of Celestone and 0.5 cc of gentamicin  were injected subconjunctivally.  Maxitrol ophthalmic ointment and  povidone ointment were applied along with a patch and Fox shield.  The  patient tolerated the procedure well and was  discharged to the postanesthesia recovery room in satisfactory  condition.  He is instructed to rest today, to take Vicodin q.4 h. as  needed for pain, and to see me in the office tomorrow for further  evaluation.   DISCHARGE DIAGNOSIS:  Immature cataract right eye.      Salley Scarlet., M.D.  Electronically Signed     TB/MEDQ  D:  04/01/2006  T:  04/01/2006  Job:  [id number]

## 2010-12-13 ENCOUNTER — Ambulatory Visit (HOSPITAL_COMMUNITY)
Admission: RE | Admit: 2010-12-13 | Discharge: 2010-12-13 | Disposition: A | Discharge status: Home / Self Care | Payer: Medicare Other | Source: Ambulatory Visit | Attending: Ophthalmology | Admitting: Ophthalmology

## 2010-12-13 ENCOUNTER — Ambulatory Visit (HOSPITAL_COMMUNITY): Payer: Medicare Other

## 2010-12-13 DIAGNOSIS — I10 Essential (primary) hypertension: Secondary | ICD-10-CM | POA: Insufficient documentation

## 2010-12-13 DIAGNOSIS — Z01818 Encounter for other preprocedural examination: Secondary | ICD-10-CM | POA: Insufficient documentation

## 2010-12-13 DIAGNOSIS — Z961 Presence of intraocular lens: Secondary | ICD-10-CM | POA: Insufficient documentation

## 2010-12-13 DIAGNOSIS — H4011X0 Primary open-angle glaucoma, stage unspecified: Secondary | ICD-10-CM | POA: Insufficient documentation

## 2010-12-13 DIAGNOSIS — E669 Obesity, unspecified: Secondary | ICD-10-CM | POA: Insufficient documentation

## 2010-12-13 DIAGNOSIS — H43819 Vitreous degeneration, unspecified eye: Secondary | ICD-10-CM | POA: Insufficient documentation

## 2010-12-13 DIAGNOSIS — E119 Type 2 diabetes mellitus without complications: Secondary | ICD-10-CM | POA: Insufficient documentation

## 2010-12-13 DIAGNOSIS — Z01812 Encounter for preprocedural laboratory examination: Secondary | ICD-10-CM | POA: Insufficient documentation

## 2010-12-13 DIAGNOSIS — H47329 Drusen of optic disc, unspecified eye: Secondary | ICD-10-CM | POA: Insufficient documentation

## 2010-12-13 DIAGNOSIS — Z01811 Encounter for preprocedural respiratory examination: Secondary | ICD-10-CM

## 2010-12-13 DIAGNOSIS — H04129 Dry eye syndrome of unspecified lacrimal gland: Secondary | ICD-10-CM | POA: Insufficient documentation

## 2010-12-13 DIAGNOSIS — Z01810 Encounter for preprocedural cardiovascular examination: Secondary | ICD-10-CM | POA: Insufficient documentation

## 2010-12-13 LAB — SURGICAL PCR SCREEN
MRSA, PCR: NEGATIVE
Staphylococcus aureus: NEGATIVE

## 2010-12-13 LAB — GLUCOSE, CAPILLARY
Glucose-Capillary: 97 mg/dL (ref 70–99)
Glucose-Capillary: 88 mg/dL (ref 70–99)
Glucose-Capillary: 105 mg/dL — ABNORMAL HIGH (ref 70–99)

## 2010-12-13 LAB — CBC
HCT: 37.9 % — ABNORMAL LOW (ref 39.0–52.0)
Hemoglobin: 12.6 g/dL — ABNORMAL LOW (ref 13.0–17.0)
MCH: 28.6 pg (ref 26.0–34.0)
MCHC: 33.2 g/dL (ref 30.0–36.0)
MCV: 85.9 fL (ref 78.0–100.0)
Platelets: 172 10*3/uL (ref 150–400)
RBC: 4.41 MIL/uL (ref 4.22–5.81)
RDW: 13.6 % (ref 11.5–15.5)
WBC: 6 10*3/uL (ref 4.0–10.5)

## 2010-12-13 LAB — BASIC METABOLIC PANEL
BUN: 13 mg/dL (ref 6–23)
CO2: 28 mEq/L (ref 19–32)
Calcium: 9.2 mg/dL (ref 8.4–10.5)
Chloride: 103 mEq/L (ref 96–112)
Creatinine, Ser: 0.98 mg/dL (ref 0.50–1.35)
GFR calc Af Amer: >60 mL/min (ref >60)
GFR calc non Af Amer: >60 mL/min (ref >60)
Glucose, Bld: 94 mg/dL (ref 70–99)
Potassium: 3.9 mEq/L (ref 3.5–5.1)
Sodium: 139 mEq/L (ref 135–145)

## 2010-12-14 NOTE — Op Note (Signed)
[name], HOEFFNER             ACCOUNT NO.:  [id number]  MEDICAL RECORD NO.:  [id number]  LOCATION:  SDSC                         FACILITY:  [facility]  PHYSICIAN:  Chalmers Guest, M.D.     DATE OF BIRTH:  February 01, 1931  DATE OF PROCEDURE:  12/13/2010 DATE OF DISCHARGE:                              OPERATIVE REPORT   PREOPERATIVE DIAGNOSIS:  Uncontrolled primary open-angle glaucoma, right eye despite maximum tolerated medical therapy and laser treatment.  SURGICAL PROCEDURES:  Trabeculectomy with mitomycin C.  POSTOPERATIVE DIAGNOSIS:  Uncontrolled primary open-angle glaucoma, right eye despite maximum tolerated medical therapy and laser treatment.  COMPLICATIONS:  None.  ANESTHESIA:  2% Xylocaine with epinephrine in a 50/50 mixture of 0.75% Marcaine with ampule of Wydase.  PROCEDURE:  The patient was taken to the operating room under monitored anesthesia.  He was given a peribulbar block with the aforementioned local anesthetic agent.  Following this, the patient's face was prepped and draped in the usual sterile fashion with the surgeon sitting superiorly.  The operating microscope was put into position and while focusing it was noted that the operating microscope would not change magnification on the foot pedal.  I was able to focus the scope and move the X, Y, Z.  I asked the nursing staff to check the scope.  They checked the scope, we changed the pedal, but it would not change magnification, therefore I proceeded to operate using a 6-0 nylon suture passed through clear cornea to protect the eye.  The patient by the way had preexisting scarring from previous cataract surgery.  The scar was noted at 12 o'clock position.  A Hoskin forceps was used to grasp conjunctiva at the limbus.  Westcott scissors were used to make an incision.  Blunt Westcott scissor used to dissect posteriorly spreading under the conjunctiva.  Bleeding was controlled by recessing Tenon fibers and with  cautery.  At this point, they informed me that there was no mitomycin C had not been ordered the day before as normal protocol at hospital; however, they have requested that the pharmacy send the mitomycin C.  Therefore we proceeded with the procedure.  Using a 45- degree blade, a partial-thickness scleral flap was created using a forceps.  Using a 5700 blade, dissection was made up to identify the corneoscleral limbus.  At this point, we stopped again waiting for the mitomycin C.  The mitomycin C had still not arrived from the pharmacy. Therefore we retested the microscope, we changed the microscope pedal, and the microscope would still not to change magnification, therefore the entire case had to be done at high magnification.  We called the pharmacy and we were informed that the pharmacist was on the way to the operating room with the mitomycin C.  After waiting approximately 8-10 minutes, the mitomycin C arrived.  Mitomycin C 0.4 mg/mL was placed on a Gelfoam sponge.  The Gelfoam sponge was then placed under the conjunctiva and allowed to stay on the eye for 2 minutes and then removed and irrigated with 60 mL of balanced salt solution.  A Wheeler blade was then used through clear cornea at the 7:30 position to enter the eye.  A cannula was  placed to ensure patency.  Following this, scleral flap was elevated.  45-degree blade was used to enter the anterior chamber through the scleral flap and 1 x 3 block of tissue was removed with the test maze sponge.  Four interrupted 10-0 nylon sutures were placed to secure the scleral flap.  The conjunctiva was then sutured with a 9-0 nylon on a BV 100 needle.  A suture was placed on a each vein and a suture was placed in the middle.  BSS was injected in the anterior chamber.  The chamber deepened.  The bleb elevated. The incision was Seidel, staining with fluorescein, therefore subconjunctival injection of Kenalog 4 mg was given in the  inferior conjunctiva.  Topical atropine and then TobraDex ointment were placed on the eye.  A patch and Fox shield were placed and the patient returned to recovery area in stable condition.  Complications were none.     Chalmers Guest, M.D.     RW/MEDQ  D:  12/13/2010  T:  12/14/2010  Job:  [id number]  Electronically Signed by Chalmers Guest M.D. on 12/14/2010 06:32:35 PM

## 2011-02-20 ENCOUNTER — Encounter (HOSPITAL_COMMUNITY)
Admission: RE | Admit: 2011-02-20 | Discharge: 2011-02-20 | Disposition: A | Discharge status: Home / Self Care | Payer: Medicare Other | Source: Ambulatory Visit | Attending: Ophthalmology | Admitting: Ophthalmology

## 2011-02-20 LAB — CBC
HCT: 40.6 % (ref 39.0–52.0)
Hemoglobin: 13.5 g/dL (ref 13.0–17.0)
MCH: 28.3 pg (ref 26.0–34.0)
MCHC: 33.3 g/dL (ref 30.0–36.0)
MCV: 85.1 fL (ref 78.0–100.0)
Platelets: 182 10*3/uL (ref 150–400)
RBC: 4.77 MIL/uL (ref 4.22–5.81)
RDW: 13.7 % (ref 11.5–15.5)
WBC: 5.3 10*3/uL (ref 4.0–10.5)

## 2011-02-20 LAB — BASIC METABOLIC PANEL
BUN: 22 mg/dL (ref 6–23)
CO2: 29 mEq/L (ref 19–32)
Calcium: 9.8 mg/dL (ref 8.4–10.5)
Chloride: 101 mEq/L (ref 96–112)
Creatinine, Ser: 1.3 mg/dL (ref 0.50–1.35)
GFR calc Af Amer: >60 mL/min (ref >60)
GFR calc non Af Amer: 54 mL/min — ABNORMAL LOW (ref >60)
Glucose, Bld: 130 mg/dL — ABNORMAL HIGH (ref 70–99)
Potassium: 4.6 mEq/L (ref 3.5–5.1)
Sodium: 138 mEq/L (ref 135–145)

## 2011-02-20 LAB — SURGICAL PCR SCREEN
MRSA, PCR: NEGATIVE
Staphylococcus aureus: NEGATIVE

## 2011-02-28 ENCOUNTER — Ambulatory Visit (HOSPITAL_COMMUNITY)
Admission: RE | Admit: 2011-02-28 | Discharge: 2011-02-28 | Disposition: A | Discharge status: Home / Self Care | Payer: Medicare Other | Source: Ambulatory Visit | Attending: Ophthalmology | Admitting: Ophthalmology

## 2011-02-28 DIAGNOSIS — Y834 Other reconstructive surgery as the cause of abnormal reaction of the patient, or of later complication, without mention of misadventure at the time of the procedure: Secondary | ICD-10-CM | POA: Insufficient documentation

## 2011-02-28 DIAGNOSIS — H409 Unspecified glaucoma: Secondary | ICD-10-CM | POA: Insufficient documentation

## 2011-02-28 DIAGNOSIS — E119 Type 2 diabetes mellitus without complications: Secondary | ICD-10-CM | POA: Insufficient documentation

## 2011-02-28 DIAGNOSIS — E669 Obesity, unspecified: Secondary | ICD-10-CM | POA: Insufficient documentation

## 2011-02-28 DIAGNOSIS — Z01812 Encounter for preprocedural laboratory examination: Secondary | ICD-10-CM | POA: Insufficient documentation

## 2011-02-28 DIAGNOSIS — IMO0002 Reserved for concepts with insufficient information to code with codable children: Secondary | ICD-10-CM | POA: Insufficient documentation

## 2011-02-28 DIAGNOSIS — I10 Essential (primary) hypertension: Secondary | ICD-10-CM | POA: Insufficient documentation

## 2011-02-28 DIAGNOSIS — Z9889 Other specified postprocedural states: Secondary | ICD-10-CM | POA: Insufficient documentation

## 2011-03-01 LAB — GLUCOSE, CAPILLARY: Glucose-Capillary: 96 mg/dL (ref 70–99)

## 2011-03-12 LAB — CBC
HCT: 43.7
Hemoglobin: 14.9
MCHC: 34.1
MCV: 85.4
Platelets: 207
RBC: 5.11
RDW: 13.8
WBC: 5.1

## 2011-03-12 LAB — URINALYSIS, ROUTINE W REFLEX MICROSCOPIC
Bilirubin Urine: NEGATIVE
Glucose, UA: NEGATIVE
Hgb urine dipstick: NEGATIVE
Ketones, ur: NEGATIVE
Nitrite: NEGATIVE
Protein, ur: NEGATIVE
Specific Gravity, Urine: 1.025
Urobilinogen, UA: 1
pH: 6

## 2011-03-12 LAB — BASIC METABOLIC PANEL
BUN: 15
CO2: 30
Calcium: 9.6
Chloride: 100
Creatinine, Ser: 1.13
GFR calc Af Amer: >60
GFR calc non Af Amer: >60
Glucose, Bld: 100 — ABNORMAL HIGH
Potassium: 3.9
Sodium: 137

## 2011-03-19 LAB — HEPATIC FUNCTION PANEL
ALT: 26
AST: 25
Albumin: 2.6 — ABNORMAL LOW
Alkaline Phosphatase: 44
Bilirubin, Direct: 0.1
Indirect Bilirubin: 0.5
Total Bilirubin: 0.6
Total Protein: 5.9 — ABNORMAL LOW

## 2011-03-19 LAB — CULTURE, BLOOD (ROUTINE X 2)
Culture: NO GROWTH
Culture: NO GROWTH

## 2011-03-19 LAB — CBC
HCT: 31.1 — ABNORMAL LOW
HCT: 34.7 — ABNORMAL LOW
Hemoglobin: 10.8 — ABNORMAL LOW
Hemoglobin: 11.9 — ABNORMAL LOW
MCHC: 34.2
MCHC: 34.8
MCV: 85.3
MCV: 85.5
Platelets: 192
Platelets: 218
RBC: 3.64 — ABNORMAL LOW
RBC: 4.06 — ABNORMAL LOW
RDW: 13.5
RDW: 14.1 — ABNORMAL HIGH
WBC: 4.7
WBC: 6

## 2011-03-19 LAB — URINALYSIS, ROUTINE W REFLEX MICROSCOPIC
Bilirubin Urine: NEGATIVE
Glucose, UA: NEGATIVE
Nitrite: POSITIVE — AB
Protein, ur: 100 — AB
Specific Gravity, Urine: 1.02
Urobilinogen, UA: 1
pH: 5.5

## 2011-03-19 LAB — COMPREHENSIVE METABOLIC PANEL
ALT: 30
AST: 46 — ABNORMAL HIGH
Albumin: 2.9 — ABNORMAL LOW
Alkaline Phosphatase: 54
BUN: 32 — ABNORMAL HIGH
CO2: 25
Calcium: 8.6
Chloride: 101
Creatinine, Ser: 1.51 — ABNORMAL HIGH
GFR calc Af Amer: 55 — ABNORMAL LOW
GFR calc non Af Amer: 46 — ABNORMAL LOW
Glucose, Bld: 166 — ABNORMAL HIGH
Potassium: 3.7
Sodium: 136
Total Bilirubin: 0.9
Total Protein: 6.6

## 2011-03-19 LAB — DIFFERENTIAL
Basophils Absolute: 0
Basophils Relative: 0
Eosinophils Absolute: 0.1
Eosinophils Relative: 1
Lymphocytes Relative: 6 — ABNORMAL LOW
Lymphs Abs: 0.4 — ABNORMAL LOW
Monocytes Absolute: 0.3
Monocytes Relative: 5
Neutro Abs: 5.3
Neutrophils Relative %: 88 — ABNORMAL HIGH

## 2011-03-19 LAB — BASIC METABOLIC PANEL
BUN: 15
BUN: 36 — ABNORMAL HIGH
CO2: 26
CO2: 28
Calcium: 8.6
Calcium: 8.8
Chloride: 102
Chloride: 105
Creatinine, Ser: 1
Creatinine, Ser: 1.55 — ABNORMAL HIGH
GFR calc Af Amer: 53 — ABNORMAL LOW
GFR calc Af Amer: >60
GFR calc non Af Amer: 44 — ABNORMAL LOW
GFR calc non Af Amer: >60
Glucose, Bld: 106 — ABNORMAL HIGH
Glucose, Bld: 133 — ABNORMAL HIGH
Potassium: 3.7
Potassium: 3.8
Sodium: 135
Sodium: 139

## 2011-03-19 LAB — URINE CULTURE
Colony Count: 80000
Special Requests: POSITIVE

## 2011-03-19 LAB — URINE MICROSCOPIC-ADD ON

## 2011-03-20 LAB — CBC
HCT: 34.9 — ABNORMAL LOW
HCT: 36.2 — ABNORMAL LOW
HCT: 36.6 — ABNORMAL LOW
HCT: 37.3 — ABNORMAL LOW
Hemoglobin: 11.7 — ABNORMAL LOW
Hemoglobin: 12.2 — ABNORMAL LOW
Hemoglobin: 12.4 — ABNORMAL LOW
Hemoglobin: 12.6 — ABNORMAL LOW
MCHC: 33.6
MCHC: 33.6
MCHC: 33.9
MCHC: 33.9
MCV: 86.9
MCV: 88.2
MCV: 88.2
MCV: 88.3
Platelets: 156
Platelets: 167
Platelets: 174
Platelets: 201
RBC: 3.95 — ABNORMAL LOW
RBC: 4.11 — ABNORMAL LOW
RBC: 4.21 — ABNORMAL LOW
RBC: 4.23
RDW: 13.2
RDW: 13.7
RDW: 13.7
RDW: 13.7
WBC: 6.1
WBC: 6.3
WBC: 6.4
WBC: 6.9

## 2011-03-20 LAB — BASIC METABOLIC PANEL
BUN: 11
BUN: 11
BUN: 14
BUN: 9
CO2: 29
CO2: 30
CO2: 30
CO2: 31
Calcium: 8.4
Calcium: 8.5
Calcium: 8.5
Calcium: 8.7
Chloride: 102
Chloride: 103
Chloride: 103
Chloride: 99
Creatinine, Ser: 0.9
Creatinine, Ser: 0.96
Creatinine, Ser: 1.06
Creatinine, Ser: 1.22
GFR calc Af Amer: >60
GFR calc Af Amer: >60
GFR calc Af Amer: >60
GFR calc Af Amer: >60
GFR calc non Af Amer: 58 — ABNORMAL LOW
GFR calc non Af Amer: >60
GFR calc non Af Amer: >60
GFR calc non Af Amer: >60
Glucose, Bld: 117 — ABNORMAL HIGH
Glucose, Bld: 123 — ABNORMAL HIGH
Glucose, Bld: 133 — ABNORMAL HIGH
Glucose, Bld: 138 — ABNORMAL HIGH
Potassium: 3.3 — ABNORMAL LOW
Potassium: 3.4 — ABNORMAL LOW
Potassium: 3.6
Potassium: 4.1
Sodium: 135
Sodium: 138
Sodium: 138
Sodium: 139

## 2011-03-21 LAB — COMPREHENSIVE METABOLIC PANEL
ALT: 20
AST: 20
Albumin: 4
Alkaline Phosphatase: 64
BUN: 16
CO2: 32
Calcium: 9.5
Chloride: 103
Creatinine, Ser: 0.98
GFR calc Af Amer: >60
GFR calc non Af Amer: >60
Glucose, Bld: 104 — ABNORMAL HIGH
Potassium: 3.9
Sodium: 141
Total Bilirubin: 0.9
Total Protein: 7.4

## 2011-03-21 LAB — URINALYSIS, ROUTINE W REFLEX MICROSCOPIC
Bilirubin Urine: NEGATIVE
Glucose, UA: NEGATIVE
Hgb urine dipstick: NEGATIVE
Ketones, ur: NEGATIVE
Nitrite: NEGATIVE
Protein, ur: NEGATIVE
Specific Gravity, Urine: 1.025
Urobilinogen, UA: 0.2
pH: 6

## 2011-03-21 LAB — DIFFERENTIAL
Basophils Absolute: 0
Basophils Relative: 1
Eosinophils Absolute: 0.2
Eosinophils Relative: 6 — ABNORMAL HIGH
Lymphocytes Relative: 37
Lymphs Abs: 1.6
Monocytes Absolute: 0.6
Monocytes Relative: 14 — ABNORMAL HIGH
Neutro Abs: 1.9
Neutrophils Relative %: 43

## 2011-03-21 LAB — URINE CULTURE: Colony Count: 4000

## 2011-03-21 LAB — ABO/RH: ABO/RH(D): O POS

## 2011-03-21 LAB — PROTIME-INR
INR: 1
Prothrombin Time: 13

## 2011-03-21 LAB — CBC
HCT: 44
Hemoglobin: 14.9
MCHC: 33.8
MCV: 87.3
Platelets: 205
RBC: 5.04
RDW: 13.4
WBC: 4.4

## 2011-03-21 LAB — TYPE AND SCREEN
ABO/RH(D): O POS
Antibody Screen: NEGATIVE

## 2011-03-21 LAB — APTT: aPTT: 30

## 2011-03-22 NOTE — Op Note (Signed)
  NAMENIKITA, HUMBLE             ACCOUNT NO.:  [id number]  MEDICAL RECORD NO.:  [id number]  LOCATION:                                 FACILITY:  PHYSICIAN:  Chalmers Guest, M.D.          DATE OF BIRTH:  DATE OF PROCEDURE: DATE OF DISCHARGE:                              OPERATIVE REPORT   PREOPERATIVE DIAGNOSIS:  Uncontrolled glaucoma despite previous glaucoma surgery, right eye.  POSTOPERATIVE DIAGNOSIS:  Uncontrolled glaucoma despite previous glaucoma surgery, right eye.  PROCEDURE:  Revision of operative wound using mitomycin C.  ANESTHESIA:  Topical 2% Xylocaine With Epinephrine 50/50 mixture, 0.75% Marcaine, and topical tetracaine.  PROCEDURE:  The patient was taken to the operative suite where the topical medications were applied to the eye.  The patient's face was prepped and draped in usual sterile fashion.  Additional topical medication were applied to the eye.  Following this with the patient looking in the intraductal position, a 30-gauge needle was passed beneath the superior conjunctiva into the area of the previous bleb. BSS was injected.  Xylocaine was injected.  Following this, a 25-gauge needle was then placed on BSS and passed under the same area and slowly advanced under the scleral flap.  Eventually the needle tip was noted to be present in the anterior chamber.  The needle was slowly withdrawn from the eye.  The bleb was noted to elevate more diffusely filtering left and right.  Pressure was applied to the eye and the bleb was seen to be more elevated.  Intraocular pressure was estimated with the ring to be less than 15 mmHg.  A fluorescein strip was applied and it was a small Seidel positive noted at the incision site.  The chamber was deep and there was no excessive leakage.  No suture was used.  Topical TobraDex ointment was applied to the eye.  All instrumentation removed. A shield was placed and the patient returned to recovery area in  stable condition.     Chalmers Guest, M.D.     RW/MEDQ  D:  02/28/2011  T:  02/28/2011  Job:  [id number]  Electronically Signed by Chalmers Guest M.D. on 03/22/2011 05:50:02 PM

## 2011-09-10 ENCOUNTER — Other Ambulatory Visit: Payer: Self-pay | Admitting: Ophthalmology

## 2011-09-10 NOTE — H&P (Signed)
SUBJECTIVE:  CHIEF COMPLAINT:  BLUR OD - "Worst it's ever been - bad week" (-) pain  foggy OD - concerned with driving  Stopped Lumigan   Continued with Neptazane Started PredForte twice a day Continued COSOPT twice a day    HISTORY OF PRESENT ILLNESS:  Onset: years  Duration:  years  Quality:   worse  Location:  right  Intensity / Severity: severe  Context / When:   been treated for before  Mental Status: Oriented to time and place  REVIEW OF SYSTEMS  GEN: Denies weight changes, appetite changes, unusual weakness, bleeding, fever, chills, recent trauma or infections  HEENT: Denies vision changes, hearing changes, epistaxis, unusual sneezing, sore throat, swallowing difficulties, ear pain, or facial pain.  NECK: Denies neck pain swelling or stiffness.  LUNGS: Denies cough, dyspnea, orthopnea, or hemoptosis.  HEART: Denies palpitations or chest pain. Confirms HIGH BLOOD PRESSURE.  ABD: Denies abdomen pain, eructation, nausea, vomiting, hematemesis, diarrhea, constipation, hematochezia, melena, acholic stools, or flatulence.  GENT: Denies recent dysruia, urine frequency, urine hesitancy, urine urgency, urine flow-slow, urine retention, nocturia, polyuria, dark urine, or incontinence.  BJE: Denies arthralgia, joint stiffness, back pain, muscle cramps, or myalgia.  SKIN: Denies rashes, lesions, anhidrosis, bruising, or pruritus.  NEURO: Denies memory loss, disorientation, syncope, diplopia, dizziness, vertigo, clumsiness, paresthesias or cephalgia.      carotid Normal pulse no bruits     OBJECTIVE:  Vision:    OD: 20/30  (last visit 20/50 PH: ni (last visit 20/50)  (and before visit 20/40-....comes and goes - has to find the right spot OS: 20/25   VAs last visits:   Acuity:   OD: CC: 20/50 with difficulty (last visit 20/40 (and before 20/70-  OS: CC: 20/30 (last visit  20/25-2  Habitual Prescription: OD: OS: Add:  AutoRefraction: OD:   OS:  Refraction: OD: OS:                               SLIT-LAMP EXAMINATION Cornea:  +1 Staining OU  Conjunctiva OD: Lens: Elevated diffuse  Bleb (-) Seidel +2 injection OS: Lens:  elevated bleb quiet - siedel   Intra-Ocular Pressures in mm of Mercury:DP at noon  Target: Today:sitting forward    /  hand held                  OD 28 Sitting forward /26 Sitting back/              after DP/24-25Handheld after DP               OS:14                    TIME03/27/2013 10:22 08/29/2011 10:25    Central Corneal Thickness:   Gonio: OD: Gonio  Open 360 degrees Scleral Spur. No Internal  block Sclerostomy open OS: Gonio      Dilation :   Condensing Lens(es) Used:   Vitreous: OD: Vitreous:  Clear OS: Vitreous:  Clear  Optic Discs: OD: Optic Discs  thin rim temporal pallar  OS: Optic Discs  thin pale optic nerve no plaque in vessels    Macula: OD: Macula: Clear OS: Macula: Clear  Retina And Vessels: OD: Retina and Vessels Clear OS: Retina and Vessels Clear  Periphery: OD: Periphery   Clear OS: Periphery  Clear          HEALTH AND PHYSICAL  Blood Pressure:  138/82    Pulse: 60  Respiration:  GENERAL: No acute distress  HEENT: MM's WNL, TM's normal, PERLA, Conjunctiva WNL, Fundi  As Before  note above  NECK: Full ROM, no adenopathy or masses, Thyroid WNL  LUNGS: Clear to auscultation, equal BS  HEART: RR&R, no murmur, gallop, or rub, not enlarged  ABD: Normal  GENT: deferred  BJE: normal  NEURO: CN II through XII intact  SKIN: normal  STUDIES:  A-Scan     ASSESSMENT:  Primary open angle glaucoma   ICD#365.11    uncontrollled  Bleb elevated  OD IOP IOp remains too high despite bleb apperance   PLAN:  Surgery OD Suggest Ahmed Valve to ext reservoir OD with [facility]  Possible general anesthesia     

## 2011-09-19 ENCOUNTER — Inpatient Hospital Stay (HOSPITAL_COMMUNITY): Admission: RE | Admit: 2011-09-19 | Payer: Medicare Other | Source: Ambulatory Visit

## 2011-09-21 ENCOUNTER — Encounter (HOSPITAL_COMMUNITY): Payer: Self-pay | Admitting: Pharmacy Technician

## 2011-09-24 ENCOUNTER — Encounter (HOSPITAL_COMMUNITY): Payer: Self-pay | Admitting: *Deleted

## 2011-09-24 NOTE — Progress Notes (Signed)
I reached pat and he to me that the sleep lab is off on [address], close to [employer] .  .  I asked Delice Bison to call [facility]  09/25/11 during business hours for office notes.

## 2011-09-24 NOTE — Progress Notes (Signed)
Pt said he had a sleep study "across the street from [facility]", and they said he has sleep apnea.  Pt states that he did not follow up and did not get a CPAP.   Pt unsure of the name of the practice where he had procedure.  I called   [facility] , [facility], [facility] and none of these had seen pt.  I called and [facility] and did not get an answer.  I called pt's home to see if anyone could think of the name of the practice and did not get an answer.  I will call back to patients home this pm.

## 2011-09-25 ENCOUNTER — Other Ambulatory Visit: Payer: Self-pay | Admitting: Ophthalmology

## 2011-09-25 MED ORDER — TETRACAINE HCL 0.5 % OP SOLN
1.0000 [drp] | OPHTHALMIC | Status: AC
  Administered 2011-09-26 (×3): 1 [drp] via OPHTHALMIC
  Filled 2011-09-25: qty 2

## 2011-09-25 MED ORDER — GATIFLOXACIN 0.5 % OP SOLN
1.0000 [drp] | OPHTHALMIC | Status: AC
  Administered 2011-09-26 (×3): 1 [drp] via OPHTHALMIC
  Filled 2011-09-25: qty 2.5

## 2011-09-26 ENCOUNTER — Encounter (HOSPITAL_COMMUNITY)
Admission: RE | Disposition: A | Discharge status: Home / Self Care | Payer: Self-pay | Source: Ambulatory Visit | Attending: Ophthalmology

## 2011-09-26 ENCOUNTER — Encounter (HOSPITAL_COMMUNITY): Payer: Self-pay | Admitting: Anesthesiology

## 2011-09-26 ENCOUNTER — Ambulatory Visit (HOSPITAL_COMMUNITY)
Admission: RE | Admit: 2011-09-26 | Discharge: 2011-09-26 | Disposition: A | Discharge status: Home / Self Care | Payer: Medicare Other | Source: Ambulatory Visit | Attending: Ophthalmology | Admitting: Ophthalmology

## 2011-09-26 ENCOUNTER — Encounter (HOSPITAL_COMMUNITY): Payer: Self-pay | Admitting: *Deleted

## 2011-09-26 ENCOUNTER — Ambulatory Visit (HOSPITAL_COMMUNITY): Payer: Medicare Other | Admitting: Anesthesiology

## 2011-09-26 DIAGNOSIS — G4730 Sleep apnea, unspecified: Secondary | ICD-10-CM | POA: Insufficient documentation

## 2011-09-26 DIAGNOSIS — I10 Essential (primary) hypertension: Secondary | ICD-10-CM | POA: Insufficient documentation

## 2011-09-26 DIAGNOSIS — H4011X0 Primary open-angle glaucoma, stage unspecified: Secondary | ICD-10-CM | POA: Insufficient documentation

## 2011-09-26 DIAGNOSIS — Z8711 Personal history of peptic ulcer disease: Secondary | ICD-10-CM | POA: Insufficient documentation

## 2011-09-26 DIAGNOSIS — E119 Type 2 diabetes mellitus without complications: Secondary | ICD-10-CM | POA: Insufficient documentation

## 2011-09-26 DIAGNOSIS — H409 Unspecified glaucoma: Secondary | ICD-10-CM | POA: Insufficient documentation

## 2011-09-26 DIAGNOSIS — R0602 Shortness of breath: Secondary | ICD-10-CM | POA: Insufficient documentation

## 2011-09-26 DIAGNOSIS — K08409 Partial loss of teeth, unspecified cause, unspecified class: Secondary | ICD-10-CM | POA: Insufficient documentation

## 2011-09-26 HISTORY — DX: Unspecified osteoarthritis, unspecified site: M19.90

## 2011-09-26 HISTORY — DX: Insomnia, unspecified: G47.00

## 2011-09-26 HISTORY — DX: Hyperlipidemia, unspecified: E78.5

## 2011-09-26 HISTORY — PX: MINI SHUNT INSERTION: SHX5337

## 2011-09-26 HISTORY — DX: Sleep apnea, unspecified: G47.30

## 2011-09-26 HISTORY — DX: Essential (primary) hypertension: I10

## 2011-09-26 LAB — BASIC METABOLIC PANEL
BUN: 24 mg/dL — ABNORMAL HIGH (ref 6–23)
CO2: 29 mEq/L (ref 19–32)
Calcium: 9.7 mg/dL (ref 8.4–10.5)
Chloride: 103 mEq/L (ref 96–112)
Creatinine, Ser: 1.15 mg/dL (ref 0.50–1.35)
GFR calc Af Amer: 68 mL/min — ABNORMAL LOW (ref >90)
GFR calc non Af Amer: 59 mL/min — ABNORMAL LOW (ref >90)
Glucose, Bld: 113 mg/dL — ABNORMAL HIGH (ref 70–99)
Potassium: 3.7 mEq/L (ref 3.5–5.1)
Sodium: 141 mEq/L (ref 135–145)

## 2011-09-26 LAB — CBC
HCT: 40.2 % (ref 39.0–52.0)
Hemoglobin: 13.4 g/dL (ref 13.0–17.0)
MCH: 28.9 pg (ref 26.0–34.0)
MCHC: 33.3 g/dL (ref 30.0–36.0)
MCV: 86.8 fL (ref 78.0–100.0)
Platelets: 211 10*3/uL (ref 150–400)
RBC: 4.63 MIL/uL (ref 4.22–5.81)
RDW: 13 % (ref 11.5–15.5)
WBC: 4.5 10*3/uL (ref 4.0–10.5)

## 2011-09-26 LAB — GLUCOSE, CAPILLARY
Glucose-Capillary: 104 mg/dL — ABNORMAL HIGH (ref 70–99)
Glucose-Capillary: 105 mg/dL — ABNORMAL HIGH (ref 70–99)
Glucose-Capillary: 100 mg/dL — ABNORMAL HIGH (ref 70–99)

## 2011-09-26 LAB — SURGICAL PCR SCREEN
MRSA, PCR: NEGATIVE
Staphylococcus aureus: NEGATIVE

## 2011-09-26 SURGERY — INSERTION OF MINI SHUNT
Anesthesia: General | Site: Eye | Laterality: Right | Wound class: Clean

## 2011-09-26 SURGERY — INSERTION OF MINI SHUNT
Anesthesia: Monitor Anesthesia Care | Laterality: Right

## 2011-09-26 SURGERY — INSERTION OF MINI SHUNT
Anesthesia: General | Laterality: Right

## 2011-09-26 MED ORDER — PROPOFOL 10 MG/ML IV EMUL
INTRAVENOUS | Status: DC | PRN
  Administered 2011-09-26 (×2): 40 mg via INTRAVENOUS

## 2011-09-26 MED ORDER — 0.9 % SODIUM CHLORIDE (POUR BTL) OPTIME
TOPICAL | Status: DC | PRN
  Administered 2011-09-26: 1000 mL

## 2011-09-26 MED ORDER — PROVISC 10 MG/ML IO SOLN
INTRAOCULAR | Status: DC | PRN
  Administered 2011-09-26: .85 mL via INTRAOCULAR

## 2011-09-26 MED ORDER — MITOMYCIN-C INJECTION USE IN OR ONLY (0.4 MG/ML)
0.5000 mL | Freq: Once | INTRAVENOUS | Status: DC
  Filled 2011-09-26: qty 0.5

## 2011-09-26 MED ORDER — MITOMYCIN-C INJECTION USE IN OR ONLY (0.4 MG/ML)
INTRAVENOUS | Status: DC | PRN
  Administered 2011-09-26: 0.5 mL via OPHTHALMIC

## 2011-09-26 MED ORDER — SODIUM CHLORIDE 0.9 % IV SOLN
INTRAVENOUS | Status: DC
  Administered 2011-09-26: 16:00:00 via INTRAVENOUS

## 2011-09-26 MED ORDER — HYALURONIDASE HUMAN 150 UNIT/ML IJ SOLN
INTRAMUSCULAR | Status: DC | PRN
  Administered 2011-09-26: 150 [IU] via SUBCUTANEOUS

## 2011-09-26 MED ORDER — FENTANYL CITRATE 0.05 MG/ML IJ SOLN: 50.0000 ug | INTRAMUSCULAR | Status: DC | PRN

## 2011-09-26 MED ORDER — TOBRAMYCIN 0.3 % OP OINT
TOPICAL_OINTMENT | OPHTHALMIC | Status: DC | PRN
  Administered 2011-09-26: 1 via OPHTHALMIC

## 2011-09-26 MED ORDER — MIDAZOLAM HCL 2 MG/2ML IJ SOLN: 1.0000 mg | INTRAMUSCULAR | Status: DC | PRN

## 2011-09-26 MED ORDER — LIDOCAINE-EPINEPHRINE 2 %-1:100000 IJ SOLN
INTRAMUSCULAR | Status: DC | PRN
  Administered 2011-09-26: 20 mL

## 2011-09-26 MED ORDER — BUPIVACAINE HCL 0.75 % IJ SOLN
INTRAMUSCULAR | Status: DC | PRN
  Administered 2011-09-26: 10 mL

## 2011-09-26 MED ORDER — SODIUM CHLORIDE 0.9 % IV SOLN
INTRAVENOUS | Status: DC | PRN
  Administered 2011-09-26: 16:00:00 via INTRAVENOUS

## 2011-09-26 MED ORDER — TRIAMCINOLONE ACETONIDE 40 MG/ML IJ SUSP
INTRAMUSCULAR | Status: DC | PRN
  Administered 2011-09-26: .1 mL via INTRAMUSCULAR

## 2011-09-26 MED ORDER — FENTANYL CITRATE 0.05 MG/ML IJ SOLN
INTRAMUSCULAR | Status: DC | PRN
  Administered 2011-09-26 (×2): 50 ug via INTRAVENOUS

## 2011-09-26 MED ORDER — HEMOSTATIC AGENTS (NO CHARGE) OPTIME
TOPICAL | Status: DC | PRN
  Administered 2011-09-26: 1

## 2011-09-26 MED ORDER — MUPIROCIN 2 % EX OINT
TOPICAL_OINTMENT | CUTANEOUS | Status: AC
  Administered 2011-09-26: 1 via NASAL
  Filled 2011-09-26: qty 22

## 2011-09-26 MED ORDER — MUPIROCIN 2 % EX OINT
TOPICAL_OINTMENT | Freq: Two times a day (BID) | CUTANEOUS | Status: DC
  Administered 2011-09-26: 1 via NASAL
  Filled 2011-09-26: qty 22

## 2011-09-26 MED ORDER — LIDOCAINE HCL (CARDIAC) 20 MG/ML IV SOLN
INTRAVENOUS | Status: DC | PRN
  Administered 2011-09-26: 40 mg via INTRAVENOUS

## 2011-09-26 MED ORDER — BSS IO SOLN
INTRAOCULAR | Status: DC | PRN
  Administered 2011-09-26: 15 mL via INTRAOCULAR
  Administered 2011-09-26: 500 mL via INTRAOCULAR

## 2011-09-26 SURGICAL SUPPLY — 59 items
ALLOGRAFT TUTOPLAST 1.5X1.5 (Ophthalmic Related) IMPLANT
APL SRG 3 HI ABS STRL LF PLS (MISCELLANEOUS) ×1
APPLICATOR COTTON TIP 6IN STRL (MISCELLANEOUS) ×2 IMPLANT
APPLICATOR DR MATTHEWS STRL (MISCELLANEOUS) ×2 IMPLANT
BLADE EYE CATARACT 19 1.4 BEAV (BLADE) ×1 IMPLANT
BLADE STAB KNIFE 45DEG (BLADE) IMPLANT
BLADE SURG 15 STRL LF DISP TIS (BLADE) IMPLANT
BLADE SURG 15 STRL SS (BLADE)
CANISTER SUCTION 2500CC (MISCELLANEOUS) ×1 IMPLANT
CLOTH BEACON ORANGE TIMEOUT ST (SAFETY) ×2 IMPLANT
CORDS BIPOLAR (ELECTRODE) ×2 IMPLANT
DRAPE OPHTHALMIC 40X48 W POUCH (DRAPES) ×2 IMPLANT
DRAPE RETRACTOR (MISCELLANEOUS) ×2 IMPLANT
ERASER HMR WETFIELD 23G BP (MISCELLANEOUS) IMPLANT
FLUOR I STRIP AT (MISCELLANEOUS) ×1 IMPLANT
GLOVE BIO SURGEON STRL SZ7.5 (GLOVE) ×1 IMPLANT
GLOVE BIO SURGEON STRL SZ8 (GLOVE) ×2 IMPLANT
GLOVE BIO SURGEON STRL SZ8.5 (GLOVE) ×1 IMPLANT
GLOVE ECLIPSE 7.0 STRL STRAW (GLOVE) ×1 IMPLANT
GLOVE SURG SS PI 6.5 STRL IVOR (GLOVE) ×1 IMPLANT
GOWN PREVENTION PLUS XLARGE (GOWN DISPOSABLE) ×2 IMPLANT
GOWN STRL NON-REIN LRG LVL3 (GOWN DISPOSABLE) ×3 IMPLANT
KIT BASIN OR (CUSTOM PROCEDURE TRAY) ×1 IMPLANT
KIT ROOM TURNOVER OR (KITS) ×2 IMPLANT
KNIFE GRIESHABER SHARP 2.5MM (MISCELLANEOUS) ×2 IMPLANT
MARKER SKIN DUAL TIP RULER LAB (MISCELLANEOUS) ×1 IMPLANT
MASK EYE SHIELD (GAUZE/BANDAGES/DRESSINGS) ×1 IMPLANT
NDL 25GX 5/8IN NON SAFETY (NEEDLE) IMPLANT
NDL HYPO 23GX1 LL BLUE HUB (NEEDLE) IMPLANT
NDL HYPO 30X.5 LL (NEEDLE) ×1 IMPLANT
NEEDLE 22X1 1/2 (OR ONLY) (NEEDLE) ×2 IMPLANT
NEEDLE 25GX 5/8IN NON SAFETY (NEEDLE) ×2 IMPLANT
NEEDLE HYPO 23GX1 LL BLUE HUB (NEEDLE) IMPLANT
NEEDLE HYPO 30X.5 LL (NEEDLE) ×2 IMPLANT
NS IRRIG 1000ML POUR BTL (IV SOLUTION) ×2 IMPLANT
PACK CATARACT CUSTOM (CUSTOM PROCEDURE TRAY) ×2 IMPLANT
PAD ARMBOARD 7.5X6 YLW CONV (MISCELLANEOUS) ×4 IMPLANT
PAD EYE OVAL STERILE LF (GAUZE/BANDAGES/DRESSINGS) ×1 IMPLANT
SPEAR EYE SURG WECK-CEL (MISCELLANEOUS) ×1 IMPLANT
SPECIMEN JAR SMALL (MISCELLANEOUS) IMPLANT
SPONGE SURGIFOAM ABS GEL 12-7 (HEMOSTASIS) ×1 IMPLANT
SUT ETHILON 10 0 CS140 6 (SUTURE) ×1 IMPLANT
SUT ETHILON 9 0 BV100 4 (SUTURE) ×2 IMPLANT
SUT ETHILON 9 0 TG140 8 (SUTURE) ×1 IMPLANT
SUT MERSILENE 6 0 S14 DA (SUTURE) ×1 IMPLANT
SUT SILK 6 0 G 6 (SUTURE) ×2 IMPLANT
SUT VICRYL 8 0 TG140 8 (SUTURE) ×1 IMPLANT
SUT VICRYL 9-0 (SUTURE) IMPLANT
SYR 20CC LL (SYRINGE) ×2 IMPLANT
SYR 50ML SLIP (SYRINGE) ×1 IMPLANT
SYR TB 1ML LUER SLIP (SYRINGE) IMPLANT
TAPE SURG TRANSPORE 1 IN (GAUZE/BANDAGES/DRESSINGS) IMPLANT
TAPE SURGICAL TRANSPORE 1 IN (GAUZE/BANDAGES/DRESSINGS) ×1
TOWEL OR 17X24 6PK STRL BLUE (TOWEL DISPOSABLE) ×4 IMPLANT
TUBE CONNECTING 12X1/4 (SUCTIONS) ×1 IMPLANT
TUTOPLAST 1.5X1.5 (Ophthalmic Related) ×2 IMPLANT
VALVE GLAUCOMA AHMED (Prosthesis & Implant Heart) ×1 IMPLANT
WATER STERILE IRR 1000ML POUR (IV SOLUTION) ×2 IMPLANT
WIPE INSTRUMENT VISIWIPE 73X73 (MISCELLANEOUS) ×2 IMPLANT

## 2011-09-26 NOTE — Transfer of Care (Signed)
Immediate Anesthesia Transfer of Care Note  Patient: [name]  Procedure(s) Performed: Procedure(s) (LRB): INSERTION OF MINI SHUNT (Right)  Patient Location: Short Stay  Anesthesia Type: MAC  Level of Consciousness: awake, alert , oriented and patient cooperative  Airway & Oxygen Therapy: Patient Spontanous Breathing  Post-op Assessment: Report given to PACU RN, Post -op Vital signs reviewed and stable and Patient moving all extremities  Post vital signs: Reviewed and stable  Complications: No apparent anesthesia complications

## 2011-09-26 NOTE — Discharge Instructions (Addendum)
Pt to go to Dr. Linton Flemings office between 3 and 330. The patient should keep the eye patched on his eye until seen by the doctor tomorrow. He should sleep on his back or left side no pressure on the eye. Take his usual pain medications as needed if this does not relieve the pain he should call my office telephone #[phone].

## 2011-09-26 NOTE — Op Note (Signed)
Preop diagnosis and uncontrolled open angled glaucoma is male with pre-existing scarring from previous glaucoma surgery and previous cataract surgery the glaucoma is uncontrollable despite maximum tolerated medical therapy and previous procedures Postoperative diagnosis: Same Procedure: Ahmed glaucoma valve right eye with mitomycin-C and Tutoplast graft Anesthesia: Due to Xylocaine with epinephrine a 50-50 mixture 0.75% Marcaine with Wydase Procedure: The patient was given a period bulbar block with the aforementioned local anesthetic agent and a modified Van length block with the aforementioned local anesthetic agent in the operating room under monitored anesthesia. The patient's face was then prepped and draped in usual sterile fashion. With the surgeon sitting superiorly and the operating microscope and positioned a 6 hole nylon suture was passed through clear cornea at that BI the suture was placed and the 2 superior positions and clear cornea it was noted that there was a superior scar length elevated but the encapsulated superior nasal therefore the superior temporal quadrant was chosen an incision was made with a Wescott scissors at the limbus bleeding was controlled with cautery. Blunt dissection was carried posteriorly until the scleral was clear 8 mm from the limbus following this mitomycin-C 0.4 mg per cc was placed on a Gelfoam sponge and allowed to stay on half of 4 minutes and then irrigated with 60 cc of balanced salt solution. Following this the Ahmed valve was taken from the package it was time using balanced salt solution and fluid could be seen egressing from complete it was a flexible plate model P7 SN number [id number] the Tutoplast graft that was used for reference as [id number]. It was placed with the eyelids 8 mm from the limbus and sutured to the sclera with 80 [place] on a spatula needle following this a paracentesis was created at the 7:00 position and Provisc was injected and the eye a  22-gauge needle progress was then used at the superior limbus into the anterior chamber the Ahmed tube was trimmed and then placed into the anterior chamber without touching the cornea or the iris the tube was in good position was sutured to the sclera with 9-0 nylon suture following this the Tutoplast was sutured over the tube with 4 interrupted 9-0 nylon sutures in her at this point the conjunctiva was then sutured to the limbus using a 9-0 Vicryl needle on a VAS 100 needle. Watertight closure was achieved this was proven by injecting balance salt solution into the anterior chamber burping out the viscoelastic the incision was stay with worsening and there was no leakage the bladder was noted to be elevated. Therefore subconjunctival injection of Kenalog 4 mg was given in the inferior temporal quadrant. The anterior chamber remained formed throughout the case. Topical TobraDex was applied to the eye all sutures and instrumentation were removed from the eye a patch and Fox her were placed and the patient returned to recovery area in stable condition. Complications: None

## 2011-09-26 NOTE — H&P (View-Only) (Signed)
SUBJECTIVE:  CHIEF COMPLAINT:  BLUR OD - "Worst it's ever been - bad week" (-) pain  foggy OD - concerned with driving  Stopped Lumigan   Continued with Neptazane Started PredForte twice a day Continued COSOPT twice a day    HISTORY OF PRESENT ILLNESS:  Onset: years  Duration:  years  Quality:   worse  Location:  right  Intensity / Severity: severe  Context / When:   been treated for before  Mental Status: Oriented to time and place  REVIEW OF SYSTEMS  GEN: Denies weight changes, appetite changes, unusual weakness, bleeding, fever, chills, recent trauma or infections  HEENT: Denies vision changes, hearing changes, epistaxis, unusual sneezing, sore throat, swallowing difficulties, ear pain, or facial pain.  NECK: Denies neck pain swelling or stiffness.  LUNGS: Denies cough, dyspnea, orthopnea, or hemoptosis.  HEART: Denies palpitations or chest pain. Confirms HIGH BLOOD PRESSURE.  ABD: Denies abdomen pain, eructation, nausea, vomiting, hematemesis, diarrhea, constipation, hematochezia, melena, acholic stools, or flatulence.  GENT: Denies recent dysruia, urine frequency, urine hesitancy, urine urgency, urine flow-slow, urine retention, nocturia, polyuria, dark urine, or incontinence.  BJE: Denies arthralgia, joint stiffness, back pain, muscle cramps, or myalgia.  SKIN: Denies rashes, lesions, anhidrosis, bruising, or pruritus.  NEURO: Denies memory loss, disorientation, syncope, diplopia, dizziness, vertigo, clumsiness, paresthesias or cephalgia.      carotid Normal pulse no bruits     OBJECTIVE:  Vision:    OD: 20/30  (last visit 20/50 PH: ni (last visit 20/50)  (and before visit 20/40-....comes and goes - has to find the right spot OS: 20/25   VAs last visits:   Acuity:   OD: CC: 20/50 with difficulty (last visit 20/40 (and before 20/70-  OS: CC: 20/30 (last visit  20/25-2  Habitual Prescription: OD: OS: Add:  AutoRefraction: OD:   OS:  Refraction: OD: OS:                               SLIT-LAMP EXAMINATION Cornea:  +1 Staining OU  Conjunctiva OD: Lens: Elevated diffuse  Bleb (-) Seidel +2 injection OS: Lens:  elevated bleb quiet - siedel   Intra-Ocular Pressures in mm of Mercury:DP at noon  Target: Today:sitting forward    /  hand held                  OD 28 Sitting forward /26 Sitting back/              after DP/24-25Handheld after DP               OS:14                    TIME03/27/2013 10:22 08/29/2011 10:25    Central Corneal Thickness:   Gonio: OD: Gonio  Open 360 degrees Scleral Spur. No Internal  block Sclerostomy open OS: Gonio      Dilation :   Condensing Lens(es) Used:   Vitreous: OD: Vitreous:  Clear OS: Vitreous:  Clear  Optic Discs: OD: Optic Discs  thin rim temporal pallar  OS: Optic Discs  thin pale optic nerve no plaque in vessels    Macula: OD: Macula: Clear OS: Macula: Clear  Retina And Vessels: OD: Retina and Vessels Clear OS: Retina and Vessels Clear  Periphery: OD: Periphery   Clear OS: Periphery  Clear          HEALTH AND PHYSICAL  Blood Pressure:  138/82  Pulse: 60  Respiration:  GENERAL: No acute distress  HEENT: MM's WNL, TM's normal, PERLA, Conjunctiva WNL, Fundi  As Before  note above  NECK: Full ROM, no adenopathy or masses, Thyroid WNL  LUNGS: Clear to auscultation, equal BS  HEART: RR&R, no murmur, gallop, or rub, not enlarged  ABD: Normal  GENT: deferred  BJE: normal  NEURO: CN II through XII intact  SKIN: normal  STUDIES:  A-Scan     ASSESSMENT:  Primary open angle glaucoma   ICD#365.11    uncontrollled  Bleb elevated  OD IOP IOp remains too high despite bleb apperance   PLAN:  Surgery OD Suggest Ahmed Valve to ext reservoir OD with [facility]  Possible general anesthesia

## 2011-09-26 NOTE — Preoperative (Signed)
Beta Blockers   Reason not to administer Beta Blockers:Not Applicable. No home beta blockers 

## 2011-09-26 NOTE — Interval H&P Note (Signed)
History and Physical Interval Note:  09/26/2011 3:58 PM  [name]  has presented today for surgery, with the diagnosis of Uncontrolled Glaucoma  The various methods of treatment have been discussed with the patient and family. After consideration of risks, benefits and other options for treatment, the patient has consented to  Procedure(s) (LRB): [facility] with scleral reinforcement with tutoplast (Right) as a surgical intervention .  The patients' history has been reviewed, patient examined, no change in status, stable for surgery.  I have reviewed the patients' chart and labs.  Questions were answered to the patient's satisfaction.     Lejend Dalby

## 2011-09-26 NOTE — Anesthesia Preprocedure Evaluation (Addendum)
Anesthesia Evaluation  Patient identified by MRN, date of birth, ID band Patient awake    Reviewed: Allergy & Precautions, H&P , NPO status , Patient's Chart, lab work & pertinent test results, reviewed documented beta blocker date and time   Airway Mallampati: I TM Distance: >3 FB Neck ROM: Full    Dental  (+) Edentulous Upper and Dental Advisory Given   Pulmonary shortness of breath, sleep apnea ,    Pulmonary exam normal       Cardiovascular hypertension, Pt. on medications     Neuro/Psych    GI/Hepatic PUD,   Endo/Other  Diabetes mellitus-, Well Controlled, Oral Hypoglycemic Agents  Renal/GU      Musculoskeletal   Abdominal (+) + obese,   Peds  Hematology   Anesthesia Other Findings   Reproductive/Obstetrics                          Anesthesia Physical Anesthesia Plan  ASA: III  Anesthesia Plan: General   Post-op Pain Management:    Induction: Intravenous  Airway Management Planned: Oral ETT  Additional Equipment:   Intra-op Plan:   Post-operative Plan: Extubation in OR  Informed Consent: I have reviewed the patients History and Physical, chart, labs and discussed the procedure including the risks, benefits and alternatives for the proposed anesthesia with the patient or authorized representative who has indicated his/her understanding and acceptance.   Dental advisory given  Plan Discussed with: CRNA and Surgeon  Anesthesia Plan Comments:        Anesthesia Quick Evaluation

## 2011-09-26 NOTE — Anesthesia Postprocedure Evaluation (Signed)
  Anesthesia Post-op Note  Patient: [name]  Procedure(s) Performed: Procedure(s) (LRB): INSERTION OF MINI SHUNT (Right)  Patient Location: Short Stay  Anesthesia Type: MAC  Level of Consciousness: awake, alert  and oriented  Airway and Oxygen Therapy: Patient Spontanous Breathing  Post-op Pain: none  Post-op Assessment: Post-op Vital signs reviewed, Patient's Cardiovascular Status Stable, Respiratory Function Stable, Patent Airway, No signs of Nausea or vomiting and Pain level controlled  Post-op Vital Signs: Reviewed and stable  Complications: No apparent anesthesia complications

## 2011-09-28 ENCOUNTER — Encounter (HOSPITAL_COMMUNITY): Payer: Self-pay | Admitting: Ophthalmology

## 2012-03-27 ENCOUNTER — Emergency Department (HOSPITAL_COMMUNITY)
Admission: EM | Admit: 2012-03-27 | Discharge: 2012-03-27 | Disposition: A | Discharge status: Home / Self Care | Payer: Medicare Other | Attending: Emergency Medicine | Admitting: Emergency Medicine

## 2012-03-27 ENCOUNTER — Encounter (HOSPITAL_COMMUNITY): Payer: Self-pay

## 2012-03-27 ENCOUNTER — Emergency Department (HOSPITAL_COMMUNITY): Payer: Medicare Other

## 2012-03-27 DIAGNOSIS — G8929 Other chronic pain: Secondary | ICD-10-CM

## 2012-03-27 DIAGNOSIS — Z87891 Personal history of nicotine dependence: Secondary | ICD-10-CM | POA: Insufficient documentation

## 2012-03-27 DIAGNOSIS — H409 Unspecified glaucoma: Secondary | ICD-10-CM | POA: Insufficient documentation

## 2012-03-27 DIAGNOSIS — M542 Cervicalgia: Secondary | ICD-10-CM | POA: Insufficient documentation

## 2012-03-27 DIAGNOSIS — M129 Arthropathy, unspecified: Secondary | ICD-10-CM | POA: Insufficient documentation

## 2012-03-27 DIAGNOSIS — E785 Hyperlipidemia, unspecified: Secondary | ICD-10-CM | POA: Insufficient documentation

## 2012-03-27 DIAGNOSIS — G4700 Insomnia, unspecified: Secondary | ICD-10-CM | POA: Insufficient documentation

## 2012-03-27 DIAGNOSIS — M109 Gout, unspecified: Secondary | ICD-10-CM | POA: Insufficient documentation

## 2012-03-27 DIAGNOSIS — Z8669 Personal history of other diseases of the nervous system and sense organs: Secondary | ICD-10-CM | POA: Insufficient documentation

## 2012-03-27 DIAGNOSIS — I10 Essential (primary) hypertension: Secondary | ICD-10-CM | POA: Insufficient documentation

## 2012-03-27 DIAGNOSIS — M1990 Unspecified osteoarthritis, unspecified site: Secondary | ICD-10-CM

## 2012-03-27 DIAGNOSIS — Z79899 Other long term (current) drug therapy: Secondary | ICD-10-CM | POA: Insufficient documentation

## 2012-03-27 DIAGNOSIS — E119 Type 2 diabetes mellitus without complications: Secondary | ICD-10-CM | POA: Insufficient documentation

## 2012-03-27 DIAGNOSIS — Z8719 Personal history of other diseases of the digestive system: Secondary | ICD-10-CM | POA: Insufficient documentation

## 2012-03-27 MED ORDER — HYDROMORPHONE HCL PF 1 MG/ML IJ SOLN
1.0000 mg | Freq: Once | INTRAMUSCULAR | Status: AC
  Administered 2012-03-27: 1 mg via INTRAMUSCULAR
  Filled 2012-03-27: qty 1

## 2012-03-27 MED ORDER — DICLOFENAC SODIUM 75 MG PO TBEC: 75.0000 mg | DELAYED_RELEASE_TABLET | Freq: Two times a day (BID) | ORAL | Status: DC

## 2012-03-27 MED ORDER — ONDANSETRON 4 MG PO TBDP
4.0000 mg | ORAL_TABLET | Freq: Once | ORAL | Status: AC
  Administered 2012-03-27: 4 mg via ORAL
  Filled 2012-03-27: qty 1

## 2012-03-27 NOTE — ED Notes (Signed)
Patient transported to X-ray 

## 2012-03-27 NOTE — ED Provider Notes (Signed)
History     CSN: [id number]  Arrival date & time 03/27/12  1704   First MD Initiated Contact with Patient 03/27/12 1733      Chief Complaint  Patient presents with  . Back Pain  . Neck Pain    (Consider location/radiation/quality/duration/timing/severity/associated sxs/prior treatment) HPI  Pt to the ER with complaints of bilateral shoulder and neck pain. He says that it has been going on since 03/21/2012 and that he saw a doctor for this already and was given Bryson Dames, Lidoderm and salonpas medications which has not helped. He is having bilateral weakness in both arms in his mind. HE says that he takes oxycodone normally for his pains. He says that he always has it for his pain problems. When asked what hurts, he says that "something is always hurting". The pain gets worse with movements and position. It relieves with rest and certain positions. He denies numbness or tingling.  nad vss  Past Medical History  Diagnosis Date  . Gout     Right foot and leg  . Shortness of breath     with exersertion  . Sleep apnea   . Diabetes mellitus   . Hypertension   . Hyperlipidemia   . Gastric ulcer     50 Years ago  . Arthritis     All Joints  . Insomnia   . Glaucoma(365)     Past Surgical History  Procedure Date  . Cataract extraction w/ intraocular lens  implant, bilateral   . Knee arthroscopy     1990'  . Back surgery   . Glaucoma surgery     bil  . Mini shunt insertion 09/26/2011    Procedure: INSERTION OF MINI SHUNT;  Surgeon: Chalmers Guest, MD;  Location: [facility] OR;  Service: Ophthalmology;  Laterality: Right;  Insertion of Ahmed shunt    Family History  Problem Relation Age of Onset  . Anesthesia problems Neg Hx     History  Substance Use Topics  . Smoking status: Former Games developer  . Smokeless tobacco: Not on file  . Alcohol Use: No      Review of Systems   Review of Systems  Gen: no weight loss, fevers, chills, night sweats  Eyes: no discharge or drainage, no  occular pain or visual changes  Nose: no epistaxis or rhinorrhea  Mouth: no dental pain, no sore throat  Neck: + neck pain  Lungs:No wheezing, coughing or hemoptysis CV: no chest pain, palpitations, dependent edema or orthopnea  Abd: no abdominal pain, nausea, vomiting  GU: no dysuria or gross hematuria  MSK:  Bilateral shoulder pain Neuro: no headache, no focal neurologic deficits  Skin: no abnormalities Psyche: negative.    Allergies  Review of patient's allergies indicates no known allergies.  Home Medications   Current Outpatient Rx  Name Route Sig Dispense Refill  . ALLOPURINOL 300 MG PO TABS Oral Take 300 mg by mouth daily.    Marland Kitchen AMLODIPINE BESYLATE 5 MG PO TABS Oral Take 5 mg by mouth daily.    Marland Kitchen BRIMONIDINE TARTRATE 0.15 % OP SOLN Right Eye Place 1 drop into the right eye 3 (three) times daily.    . COLCHICINE 0.6 MG PO TABS Oral Take 0.6 mg by mouth 2 (two) times daily.    . DORZOLAMIDE HCL 2 % OP SOLN Right Eye Place 1 drop into the right eye 2 (two) times daily.    . DORZOLAMIDE HCL-TIMOLOL MAL 22.3-6.8 MG/ML OP SOLN Right Eye Place 1 drop into  the right eye 2 (two) times daily.    Marland Kitchen GABAPENTIN 800 MG PO TABS Oral Take 800 mg by mouth 3 (three) times daily.    Marland Kitchen GLUCOSAMINE-CHONDROITIN 500-400 MG PO TABS Oral Take 1 tablet by mouth 3 (three) times daily.    Marland Kitchen HYDROCODONE-ACETAMINOPHEN 5-500 MG PO CAPS Oral Take 1 capsule by mouth every 6 (six) hours as needed. pain    . INDOMETHACIN 50 MG PO CAPS Oral Take 50 mg by mouth 2 (two) times daily with a meal.     . LOSARTAN POTASSIUM-HCTZ 100-25 MG PO TABS Oral Take 1 tablet by mouth daily.    Marland Kitchen METFORMIN HCL ER (OSM) 1000 MG PO TB24 Oral Take 1,000 mg by mouth 2 (two) times daily with a meal.    . OVER THE COUNTER MEDICATION Oral Take 1 tablet by mouth 3 (three) times daily with meals. HGC OTC Supplement    . PRAVASTATIN SODIUM 40 MG PO TABS Oral Take 40 mg by mouth daily.    Marland Kitchen SITAGLIPTIN PHOSPHATE 100 MG PO TABS Oral Take  100 mg by mouth daily.    Marland Kitchen TAMSULOSIN HCL 0.4 MG PO CAPS Oral Take 0.4 mg by mouth daily.    . TRAVOPROST (BAK FREE) 0.004 % OP SOLN Right Eye Place 1 drop into the right eye at bedtime.    Marland Kitchen DICLOFENAC SODIUM 75 MG PO TBEC Oral Take 1 tablet (75 mg total) by mouth 2 (two) times daily. 10 tablet 0    BP 125/76  Pulse 101  Temp 98 F (36.7 C) (Oral)  Resp 20  SpO2 93%  Physical Exam  Nursing note and vitals reviewed. Constitutional: He appears well-developed and well-nourished. No distress.  HENT:  Head: Normocephalic and atraumatic.  Eyes: Pupils are equal, round, and reactive to light.  Neck: Normal range of motion. Neck supple.  Cardiovascular: Normal rate and regular rhythm.   Pulmonary/Chest: Effort normal.  Abdominal: Soft.  Musculoskeletal:       Cervical back: He exhibits tenderness, pain and spasm. He exhibits normal range of motion, no bony tenderness, no swelling, no edema, no deformity, no laceration and normal pulse.       Back:  Neurological: He is alert.  Skin: Skin is warm and dry.    ED Course  Procedures (including critical care time)  Labs Reviewed - No data to display Dg Cervical Spine Complete  03/27/2012  *RADIOLOGY REPORT*  Clinical Data: Bilateral shoulder pain and neck pain.  No known injury.  CERVICAL SPINE - COMPLETE 4+ VIEW  Comparison: 09/18/2011  Findings: AP, lateral, obliques and odontoid view of the cervical spine were obtained.  There is severe disc space narrowing at C3-C4 which is unchanged.  Marked disc space narrowing at C6-C7.  The prevertebral soft tissues are within normal limits.  Again noted are prominent osteophytes along the anterior aspect of C3-C4. Alignment at the cervicothoracic junction is within normal limits. No evidence for acute fracture.  IMPRESSION: Multilevel cervical spondylosis.  No acute bony abnormality.   Original Report Authenticated By: Richarda Overlie, M.D.    Dg Shoulder Right  03/27/2012  *RADIOLOGY REPORT*   Clinical Data: Bilateral shoulder pain.  RIGHT SHOULDER - 2+ VIEW  Comparison: Shoulder MRI 10/11/2011  Findings: Three views of the right shoulder were obtained.  There are mild-moderate degenerative changes in the glenohumeral joint. Negative for acute fracture or dislocation.  AC joint appears intact.  There is a spinal neurostimulator device.  IMPRESSION:  Degenerative changes without acute  bony abnormality.   Original Report Authenticated By: Richarda Overlie, M.D.    Dg Shoulder Left  03/27/2012  *RADIOLOGY REPORT*  Clinical Data: Bilateral shoulder pain.  Neck pain.  LEFT SHOULDER - 2+ VIEW  Comparison: Right shoulder 03/27/2012  Findings: Three views of the shoulder are negative for a fracture or dislocation.  There is subchondral sclerosis, osteophytes and joint space narrowing in the left shoulder.  The left AC joint is intact.  There is a spinal neurostimulator device.  IMPRESSION: Severe osteoarthritis in the left shoulder.  No acute bony abnormality.   Original Report Authenticated By: Richarda Overlie, M.D.      1. Chronic pain   2. Arthritis       MDM  Pt given 1mg  IM Dilaudid in ED and his pain significantly improved. He and his wife say that he is ready to go home. He requests more pain medication but he has a pain management contract and  I told him that it would violate his contract. Therefore I will add a short course of Voltaren to his regimin. His wife says she will call the pain management doctor in the morning and have the medications adjusted.  Discussed case with Dr. Rubin Payor.  Pt has been advised of the symptoms that warrant their return to the ED. Patient has voiced understanding and has agreed to follow-up with the PCP or specialist.        Dorthula Matas, PA 03/27/12 [phone]

## 2012-03-27 NOTE — ED Notes (Signed)
Patient reports that he began having posterior neck and shoulder pain on 03/21/12 and went to see PCP 03/23/12. Patient was prescribed Valtrex, Lidoderm patches, and Salonpas. Patient has not had any blisters appear. Patient states the pain is worse and now includes the entire back and both arms. MAE but has increased pain when he does.

## 2012-03-31 NOTE — ED Provider Notes (Signed)
Medical screening examination/treatment/procedure(s) were performed by non-physician practitioner and as supervising physician I was immediately available for consultation/collaboration.  Juliet Rude. Rubin Payor, MD 03/31/12 [phone]

## 2012-04-04 ENCOUNTER — Encounter (INDEPENDENT_AMBULATORY_CARE_PROVIDER_SITE_OTHER): Payer: Medicare Other | Admitting: Ophthalmology

## 2012-04-04 DIAGNOSIS — E1139 Type 2 diabetes mellitus with other diabetic ophthalmic complication: Secondary | ICD-10-CM

## 2012-04-04 DIAGNOSIS — H348192 Central retinal vein occlusion, unspecified eye, stable: Secondary | ICD-10-CM

## 2012-04-04 DIAGNOSIS — I10 Essential (primary) hypertension: Secondary | ICD-10-CM

## 2012-04-04 DIAGNOSIS — H3530 Unspecified macular degeneration: Secondary | ICD-10-CM

## 2012-04-04 DIAGNOSIS — E11319 Type 2 diabetes mellitus with unspecified diabetic retinopathy without macular edema: Secondary | ICD-10-CM

## 2012-04-04 DIAGNOSIS — H35039 Hypertensive retinopathy, unspecified eye: Secondary | ICD-10-CM

## 2012-04-07 ENCOUNTER — Ambulatory Visit (INDEPENDENT_AMBULATORY_CARE_PROVIDER_SITE_OTHER): Payer: Medicare Other | Admitting: Ophthalmology

## 2012-04-07 DIAGNOSIS — I10 Essential (primary) hypertension: Secondary | ICD-10-CM

## 2012-04-07 DIAGNOSIS — E1165 Type 2 diabetes mellitus with hyperglycemia: Secondary | ICD-10-CM

## 2012-04-07 DIAGNOSIS — E11319 Type 2 diabetes mellitus with unspecified diabetic retinopathy without macular edema: Secondary | ICD-10-CM

## 2012-04-07 DIAGNOSIS — H3530 Unspecified macular degeneration: Secondary | ICD-10-CM

## 2012-04-07 DIAGNOSIS — E1139 Type 2 diabetes mellitus with other diabetic ophthalmic complication: Secondary | ICD-10-CM

## 2012-04-07 DIAGNOSIS — H35039 Hypertensive retinopathy, unspecified eye: Secondary | ICD-10-CM

## 2012-04-07 DIAGNOSIS — H348192 Central retinal vein occlusion, unspecified eye, stable: Secondary | ICD-10-CM

## 2012-04-14 ENCOUNTER — Other Ambulatory Visit: Payer: Self-pay | Admitting: Family Medicine

## 2012-04-14 DIAGNOSIS — R0789 Other chest pain: Secondary | ICD-10-CM

## 2012-04-15 ENCOUNTER — Ambulatory Visit
Admission: RE | Admit: 2012-04-15 | Discharge: 2012-04-15 | Disposition: A | Discharge status: Home / Self Care | Payer: Medicare Other | Source: Ambulatory Visit | Attending: Family Medicine | Admitting: Family Medicine

## 2012-04-15 DIAGNOSIS — R0789 Other chest pain: Secondary | ICD-10-CM

## 2012-05-07 ENCOUNTER — Encounter (INDEPENDENT_AMBULATORY_CARE_PROVIDER_SITE_OTHER): Payer: Medicare Other | Admitting: Ophthalmology

## 2012-05-07 DIAGNOSIS — H35039 Hypertensive retinopathy, unspecified eye: Secondary | ICD-10-CM

## 2012-05-07 DIAGNOSIS — H348192 Central retinal vein occlusion, unspecified eye, stable: Secondary | ICD-10-CM

## 2012-05-07 DIAGNOSIS — I10 Essential (primary) hypertension: Secondary | ICD-10-CM

## 2012-05-07 DIAGNOSIS — E11319 Type 2 diabetes mellitus with unspecified diabetic retinopathy without macular edema: Secondary | ICD-10-CM

## 2012-05-07 DIAGNOSIS — E1165 Type 2 diabetes mellitus with hyperglycemia: Secondary | ICD-10-CM

## 2012-05-07 DIAGNOSIS — E1139 Type 2 diabetes mellitus with other diabetic ophthalmic complication: Secondary | ICD-10-CM

## 2012-05-07 DIAGNOSIS — H43819 Vitreous degeneration, unspecified eye: Secondary | ICD-10-CM

## 2012-05-07 DIAGNOSIS — H3530 Unspecified macular degeneration: Secondary | ICD-10-CM

## 2012-06-06 ENCOUNTER — Encounter (INDEPENDENT_AMBULATORY_CARE_PROVIDER_SITE_OTHER): Payer: Medicare Other | Admitting: Ophthalmology

## 2012-06-06 DIAGNOSIS — H43819 Vitreous degeneration, unspecified eye: Secondary | ICD-10-CM

## 2012-06-06 DIAGNOSIS — I10 Essential (primary) hypertension: Secondary | ICD-10-CM

## 2012-06-06 DIAGNOSIS — E11319 Type 2 diabetes mellitus with unspecified diabetic retinopathy without macular edema: Secondary | ICD-10-CM

## 2012-06-06 DIAGNOSIS — H35039 Hypertensive retinopathy, unspecified eye: Secondary | ICD-10-CM

## 2012-06-06 DIAGNOSIS — E1139 Type 2 diabetes mellitus with other diabetic ophthalmic complication: Secondary | ICD-10-CM

## 2012-06-06 DIAGNOSIS — H348192 Central retinal vein occlusion, unspecified eye, stable: Secondary | ICD-10-CM

## 2012-07-04 ENCOUNTER — Encounter (INDEPENDENT_AMBULATORY_CARE_PROVIDER_SITE_OTHER): Payer: Medicare Other | Admitting: Ophthalmology

## 2012-07-04 DIAGNOSIS — H348192 Central retinal vein occlusion, unspecified eye, stable: Secondary | ICD-10-CM

## 2012-07-04 DIAGNOSIS — H43819 Vitreous degeneration, unspecified eye: Secondary | ICD-10-CM

## 2012-07-04 DIAGNOSIS — E11319 Type 2 diabetes mellitus with unspecified diabetic retinopathy without macular edema: Secondary | ICD-10-CM

## 2012-07-04 DIAGNOSIS — H35039 Hypertensive retinopathy, unspecified eye: Secondary | ICD-10-CM

## 2012-07-04 DIAGNOSIS — I10 Essential (primary) hypertension: Secondary | ICD-10-CM

## 2012-07-31 ENCOUNTER — Encounter (INDEPENDENT_AMBULATORY_CARE_PROVIDER_SITE_OTHER): Payer: Medicare Other | Admitting: Ophthalmology

## 2012-07-31 DIAGNOSIS — H3530 Unspecified macular degeneration: Secondary | ICD-10-CM

## 2012-07-31 DIAGNOSIS — H35039 Hypertensive retinopathy, unspecified eye: Secondary | ICD-10-CM

## 2012-07-31 DIAGNOSIS — E1165 Type 2 diabetes mellitus with hyperglycemia: Secondary | ICD-10-CM

## 2012-07-31 DIAGNOSIS — E11319 Type 2 diabetes mellitus with unspecified diabetic retinopathy without macular edema: Secondary | ICD-10-CM

## 2012-07-31 DIAGNOSIS — I10 Essential (primary) hypertension: Secondary | ICD-10-CM

## 2012-07-31 DIAGNOSIS — H43819 Vitreous degeneration, unspecified eye: Secondary | ICD-10-CM

## 2012-07-31 DIAGNOSIS — H348192 Central retinal vein occlusion, unspecified eye, stable: Secondary | ICD-10-CM

## 2012-07-31 DIAGNOSIS — E1139 Type 2 diabetes mellitus with other diabetic ophthalmic complication: Secondary | ICD-10-CM

## 2012-08-01 ENCOUNTER — Other Ambulatory Visit (HOSPITAL_COMMUNITY): Payer: Self-pay | Admitting: Pain Medicine

## 2012-08-01 DIAGNOSIS — M545 Low back pain, unspecified: Secondary | ICD-10-CM

## 2012-08-05 ENCOUNTER — Ambulatory Visit (HOSPITAL_COMMUNITY)
Admission: RE | Admit: 2012-08-05 | Discharge: 2012-08-05 | Disposition: A | Discharge status: Home / Self Care | Payer: Medicare Other | Source: Ambulatory Visit | Attending: Pain Medicine | Admitting: Pain Medicine

## 2012-08-05 DIAGNOSIS — M545 Low back pain, unspecified: Secondary | ICD-10-CM | POA: Insufficient documentation

## 2012-08-05 DIAGNOSIS — R29898 Other symptoms and signs involving the musculoskeletal system: Secondary | ICD-10-CM | POA: Insufficient documentation

## 2012-08-05 DIAGNOSIS — M48061 Spinal stenosis, lumbar region without neurogenic claudication: Secondary | ICD-10-CM | POA: Insufficient documentation

## 2012-08-05 DIAGNOSIS — M5137 Other intervertebral disc degeneration, lumbosacral region: Secondary | ICD-10-CM | POA: Insufficient documentation

## 2012-08-05 DIAGNOSIS — M79609 Pain in unspecified limb: Secondary | ICD-10-CM | POA: Insufficient documentation

## 2012-08-05 DIAGNOSIS — M51379 Other intervertebral disc degeneration, lumbosacral region without mention of lumbar back pain or lower extremity pain: Secondary | ICD-10-CM | POA: Insufficient documentation

## 2012-08-05 LAB — CREATININE, SERUM
Creatinine, Ser: 1.17 mg/dL (ref 0.50–1.35)
GFR calc Af Amer: 67 mL/min — ABNORMAL LOW (ref >90)
GFR calc non Af Amer: 57 mL/min — ABNORMAL LOW (ref >90)

## 2012-08-05 LAB — BUN: BUN: 19 mg/dL (ref 6–23)

## 2012-08-05 MED ORDER — GADOBENATE DIMEGLUMINE 529 MG/ML IV SOLN
20.0000 mL | Freq: Once | INTRAVENOUS | Status: AC
  Administered 2012-08-05: 20 mL via INTRAVENOUS

## 2012-08-28 ENCOUNTER — Encounter (INDEPENDENT_AMBULATORY_CARE_PROVIDER_SITE_OTHER): Payer: Medicare Other | Admitting: Ophthalmology

## 2012-08-28 DIAGNOSIS — H35039 Hypertensive retinopathy, unspecified eye: Secondary | ICD-10-CM

## 2012-08-28 DIAGNOSIS — H348192 Central retinal vein occlusion, unspecified eye, stable: Secondary | ICD-10-CM

## 2012-08-28 DIAGNOSIS — H2700 Aphakia, unspecified eye: Secondary | ICD-10-CM

## 2012-08-28 DIAGNOSIS — E1139 Type 2 diabetes mellitus with other diabetic ophthalmic complication: Secondary | ICD-10-CM

## 2012-08-28 DIAGNOSIS — E1165 Type 2 diabetes mellitus with hyperglycemia: Secondary | ICD-10-CM

## 2012-08-28 DIAGNOSIS — I10 Essential (primary) hypertension: Secondary | ICD-10-CM

## 2012-08-28 DIAGNOSIS — E11319 Type 2 diabetes mellitus with unspecified diabetic retinopathy without macular edema: Secondary | ICD-10-CM

## 2012-08-28 DIAGNOSIS — H43819 Vitreous degeneration, unspecified eye: Secondary | ICD-10-CM

## 2012-11-21 ENCOUNTER — Encounter (INDEPENDENT_AMBULATORY_CARE_PROVIDER_SITE_OTHER): Payer: Medicare Other | Admitting: Ophthalmology

## 2012-11-21 DIAGNOSIS — H35039 Hypertensive retinopathy, unspecified eye: Secondary | ICD-10-CM

## 2012-11-21 DIAGNOSIS — H3530 Unspecified macular degeneration: Secondary | ICD-10-CM

## 2012-11-21 DIAGNOSIS — H348192 Central retinal vein occlusion, unspecified eye, stable: Secondary | ICD-10-CM

## 2012-11-21 DIAGNOSIS — H4089 Other specified glaucoma: Secondary | ICD-10-CM

## 2012-11-21 DIAGNOSIS — I10 Essential (primary) hypertension: Secondary | ICD-10-CM

## 2012-11-28 ENCOUNTER — Encounter (INDEPENDENT_AMBULATORY_CARE_PROVIDER_SITE_OTHER): Payer: Medicare Other | Admitting: Ophthalmology

## 2012-11-28 DIAGNOSIS — H348192 Central retinal vein occlusion, unspecified eye, stable: Secondary | ICD-10-CM

## 2012-11-28 DIAGNOSIS — E1165 Type 2 diabetes mellitus with hyperglycemia: Secondary | ICD-10-CM

## 2012-11-28 DIAGNOSIS — H35039 Hypertensive retinopathy, unspecified eye: Secondary | ICD-10-CM

## 2012-11-28 DIAGNOSIS — E11359 Type 2 diabetes mellitus with proliferative diabetic retinopathy without macular edema: Secondary | ICD-10-CM

## 2012-11-28 DIAGNOSIS — I10 Essential (primary) hypertension: Secondary | ICD-10-CM

## 2012-11-28 DIAGNOSIS — E1139 Type 2 diabetes mellitus with other diabetic ophthalmic complication: Secondary | ICD-10-CM

## 2012-11-28 DIAGNOSIS — H43819 Vitreous degeneration, unspecified eye: Secondary | ICD-10-CM

## 2012-11-28 DIAGNOSIS — E11319 Type 2 diabetes mellitus with unspecified diabetic retinopathy without macular edema: Secondary | ICD-10-CM

## 2012-12-20 ENCOUNTER — Inpatient Hospital Stay (HOSPITAL_COMMUNITY)
Admission: EM | Admit: 2012-12-20 | Discharge: 2012-12-30 | DRG: 872 | Disposition: A | Discharge status: Skilled Nursing Facility | Payer: Medicare Other | Attending: Internal Medicine | Admitting: Internal Medicine

## 2012-12-20 ENCOUNTER — Emergency Department (HOSPITAL_COMMUNITY): Payer: Medicare Other

## 2012-12-20 ENCOUNTER — Encounter (HOSPITAL_COMMUNITY): Payer: Self-pay | Admitting: Emergency Medicine

## 2012-12-20 DIAGNOSIS — E876 Hypokalemia: Secondary | ICD-10-CM | POA: Diagnosis present

## 2012-12-20 DIAGNOSIS — I10 Essential (primary) hypertension: Secondary | ICD-10-CM | POA: Diagnosis present

## 2012-12-20 DIAGNOSIS — R55 Syncope and collapse: Secondary | ICD-10-CM

## 2012-12-20 DIAGNOSIS — H5450 Low vision, one eye, unspecified eye: Secondary | ICD-10-CM | POA: Diagnosis present

## 2012-12-20 DIAGNOSIS — N179 Acute kidney failure, unspecified: Secondary | ICD-10-CM | POA: Diagnosis present

## 2012-12-20 DIAGNOSIS — E785 Hyperlipidemia, unspecified: Secondary | ICD-10-CM | POA: Diagnosis present

## 2012-12-20 DIAGNOSIS — IMO0002 Reserved for concepts with insufficient information to code with codable children: Secondary | ICD-10-CM | POA: Diagnosis present

## 2012-12-20 DIAGNOSIS — H543 Unqualified visual loss, both eyes: Secondary | ICD-10-CM | POA: Diagnosis present

## 2012-12-20 DIAGNOSIS — E1142 Type 2 diabetes mellitus with diabetic polyneuropathy: Secondary | ICD-10-CM | POA: Diagnosis present

## 2012-12-20 DIAGNOSIS — H409 Unspecified glaucoma: Secondary | ICD-10-CM | POA: Diagnosis present

## 2012-12-20 DIAGNOSIS — N189 Chronic kidney disease, unspecified: Secondary | ICD-10-CM | POA: Diagnosis present

## 2012-12-20 DIAGNOSIS — A419 Sepsis, unspecified organism: Principal | ICD-10-CM | POA: Diagnosis present

## 2012-12-20 DIAGNOSIS — E860 Dehydration: Secondary | ICD-10-CM | POA: Diagnosis present

## 2012-12-20 DIAGNOSIS — I959 Hypotension, unspecified: Secondary | ICD-10-CM | POA: Diagnosis present

## 2012-12-20 DIAGNOSIS — I824Z9 Acute embolism and thrombosis of unspecified deep veins of unspecified distal lower extremity: Secondary | ICD-10-CM | POA: Diagnosis not present

## 2012-12-20 DIAGNOSIS — Z6832 Body mass index (BMI) 32.0-32.9, adult: Secondary | ICD-10-CM

## 2012-12-20 DIAGNOSIS — L02619 Cutaneous abscess of unspecified foot: Secondary | ICD-10-CM | POA: Diagnosis present

## 2012-12-20 DIAGNOSIS — E1169 Type 2 diabetes mellitus with other specified complication: Secondary | ICD-10-CM | POA: Diagnosis present

## 2012-12-20 DIAGNOSIS — L089 Local infection of the skin and subcutaneous tissue, unspecified: Secondary | ICD-10-CM | POA: Diagnosis present

## 2012-12-20 DIAGNOSIS — E1149 Type 2 diabetes mellitus with other diabetic neurological complication: Secondary | ICD-10-CM | POA: Diagnosis present

## 2012-12-20 DIAGNOSIS — I129 Hypertensive chronic kidney disease with stage 1 through stage 4 chronic kidney disease, or unspecified chronic kidney disease: Secondary | ICD-10-CM | POA: Diagnosis present

## 2012-12-20 DIAGNOSIS — L0390 Cellulitis, unspecified: Secondary | ICD-10-CM | POA: Diagnosis present

## 2012-12-20 DIAGNOSIS — E118 Type 2 diabetes mellitus with unspecified complications: Secondary | ICD-10-CM | POA: Diagnosis present

## 2012-12-20 DIAGNOSIS — Z87891 Personal history of nicotine dependence: Secondary | ICD-10-CM

## 2012-12-20 DIAGNOSIS — E119 Type 2 diabetes mellitus without complications: Secondary | ICD-10-CM

## 2012-12-20 DIAGNOSIS — M109 Gout, unspecified: Secondary | ICD-10-CM | POA: Diagnosis present

## 2012-12-20 DIAGNOSIS — E669 Obesity, unspecified: Secondary | ICD-10-CM | POA: Diagnosis present

## 2012-12-20 HISTORY — DX: Morbid (severe) obesity due to excess calories: E66.01

## 2012-12-20 MED ORDER — SODIUM CHLORIDE 0.9 % IV BOLUS (SEPSIS)
1000.0000 mL | INTRAVENOUS | Status: AC | PRN
  Administered 2012-12-20 – 2012-12-24 (×2): 1000 mL via INTRAVENOUS

## 2012-12-20 MED ORDER — VANCOMYCIN HCL IN DEXTROSE 1-5 GM/200ML-% IV SOLN
1000.0000 mg | Freq: Once | INTRAVENOUS | Status: AC
  Administered 2012-12-20: 1000 mg via INTRAVENOUS
  Filled 2012-12-20: qty 200

## 2012-12-20 MED ORDER — PIPERACILLIN-TAZOBACTAM 3.375 G IVPB 30 MIN
3.3750 g | Freq: Once | INTRAVENOUS | Status: AC
  Administered 2012-12-20: 3.375 g via INTRAVENOUS
  Filled 2012-12-20: qty 50

## 2012-12-20 NOTE — ED Notes (Addendum)
Pt to ED via EMS from home for hypotension and syncopal episode. Per family, pt was doing well all day and eat dinner then suddenly passed out and unresponsive. Per EMS, pt was not responsive when arrived at scene with BP-50/30. EMS lower pt head, then pt able to respond. Pt alert and oriented x4 at this time. BP-97/60 now. CBG-123

## 2012-12-20 NOTE — ED Provider Notes (Signed)
History    CSN: [id number] Arrival date & time 12/20/12  2203  First MD Initiated Contact with Patient 12/20/12 2235     Chief Complaint  Patient presents with  . Hypotension  . Loss of Consciousness   (Consider location/radiation/quality/duration/timing/severity/associated sxs/prior Treatment) HPI Comments: [name] is a 79 y.o. Male who is here for evaluation of syncope. He was walking in his home when he suddenly fell and passed out. He did not injure himself. His wife called EMS and had him transferred here. Recently, he has had problems with draining sores  on both feet. He has not had fever, chills, nausea, vomiting, weakness, or dizziness preceding the episode. He has chronic blindness, bilaterally. He has not been able to visualize his feeds very well. His appetite has been good. There are no other known modifying factors.  Patient is a 79 y.o. male presenting with syncope. The history is provided by the patient.  Loss of Consciousness  Past Medical History  Diagnosis Date  . Gout     Right foot and leg  . Shortness of breath     with exersertion  . Sleep apnea   . Diabetes mellitus   . Hypertension   . Hyperlipidemia   . Gastric ulcer     50 Years ago  . Arthritis     All Joints  . Insomnia   . Glaucoma   . Morbid obesity    Past Surgical History  Procedure Laterality Date  . Cataract extraction w/ intraocular lens  implant, bilateral    . Knee arthroscopy      1990'  . Back surgery    . Glaucoma surgery      bil  . Mini shunt insertion  09/26/2011    Procedure: INSERTION OF MINI SHUNT;  Surgeon: Chalmers Guest, MD;  Location: [facility] OR;  Service: Ophthalmology;  Laterality: Right;  Insertion of Ahmed shunt   Family History  Problem Relation Age of Onset  . Anesthesia problems Neg Hx    History  Substance Use Topics  . Smoking status: Former Smoker -- 2.00 packs/day    Types: Cigarettes    Quit date: 12/22/1982  . Smokeless tobacco: Never Used  .  Alcohol Use: No    Review of Systems  Cardiovascular: Positive for syncope.  All other systems reviewed and are negative.    Allergies  Review of patient's allergies indicates no known allergies.  Home Medications   No current outpatient prescriptions on file. BP 119/73  Pulse 87  Temp(Src) 97.7 F (36.5 C) (Oral)  Resp 18  Ht 5\' 10"  (1.778 m)  Wt 212 lb 9.6 oz (96.435 kg)  BMI 30.51 kg/m2  SpO2 99% Physical Exam  Nursing note and vitals reviewed. Constitutional: He is oriented to person, place, and time. He appears well-developed.  Overweight, elderly  HENT:  Head: Normocephalic and atraumatic.  Right Ear: External ear normal.  Left Ear: External ear normal.  Eyes: Conjunctivae and EOM are normal.  Irregular pupils, bilaterally  Neck: Normal range of motion and phonation normal. Neck supple.  Cardiovascular: Normal rate, regular rhythm, normal heart sounds and intact distal pulses.   Pulmonary/Chest: Effort normal and breath sounds normal. He exhibits no bony tenderness.  Abdominal: Soft. Normal appearance. There is no tenderness.  Musculoskeletal: He exhibits no edema.  Feet tender, bilaterally. Clear drainage in the web spaces of both feet. Ecchymosis plantar aspect, foreeet, bilaterally, right greater than left. Moderate swelling, right foot, greater than left. No proximal  streaking. No tenderness to palpation of ankles or knees.  Neurological: He is alert and oriented to person, place, and time. He has normal strength. No sensory deficit. He exhibits normal muscle tone. Coordination normal.  Skin: Skin is warm, dry and intact.  Psychiatric: He has a normal mood and affect. His behavior is normal. Judgment and thought content normal.    ED Course  Procedures (including critical care time)  Medications  sodium chloride 0.9 % bolus 1,000 mL (1,000 mLs Intravenous New Bag/Given 12/20/12 2200)  acetaminophen (TYLENOL) tablet 650 mg (650 mg Oral Given 12/21/12 1121)     Or  acetaminophen (TYLENOL) suppository 650 mg ( Rectal See Alternative 12/21/12 1121)  oxyCODONE (Oxy IR/ROXICODONE) immediate release tablet 5 mg (not administered)  HYDROmorphone (DILAUDID) injection 0.5-1 mg (not administered)  zolpidem (AMBIEN) tablet 5 mg (not administered)  ondansetron (ZOFRAN) tablet 4 mg (not administered)    Or  ondansetron (ZOFRAN) injection 4 mg (not administered)  alum & mag hydroxide-simeth (MAALOX/MYLANTA) 200-200-20 MG/5ML suspension 30 mL (not administered)  enoxaparin (LOVENOX) injection 40 mg (40 mg Subcutaneous Given 12/21/12 1118)  ceFEPIme (MAXIPIME) 1 g in dextrose 5 % 50 mL IVPB (1 g Intravenous New Bag/Given 12/21/12 1117)  fluconazole (DIFLUCAN) tablet 150 mg (150 mg Oral Given 12/21/12 1118)  insulin aspart (novoLOG) injection 0-9 Units (1 Units Subcutaneous Given 12/21/12 1656)  insulin aspart (novoLOG) injection 0-5 Units (0 Units Subcutaneous Not Given 12/21/12 0100)  vancomycin (VANCOCIN) 1,500 mg in sodium chloride 0.9 % 500 mL IVPB (1,500 mg Intravenous Given 12/21/12 1238)  cyclopentolate (CYCLODRYL,CYCLOGYL) 1 % ophthalmic solution 1 drop (not administered)  dorzolamide (TRUSOPT) 2 % ophthalmic solution 1 drop (not administered)  polyvinyl alcohol (LIQUIFILM TEARS) 1.4 % ophthalmic solution 1 drop (not administered)  brinzolamide (AZOPT) 1 % ophthalmic suspension 1 drop (not administered)    And  brimonidine (ALPHAGAN) 0.2 % ophthalmic solution 1 drop (not administered)  hydrOXYzine (ATARAX/VISTARIL) tablet 10 mg (not administered)  piperacillin-tazobactam (ZOSYN) IVPB 3.375 g (0 g Intravenous Stopped 12/21/12 0039)  vancomycin (VANCOCIN) IVPB 1000 mg/200 mL premix (0 mg Intravenous Stopped 12/21/12 0039)  0.9 %  sodium chloride infusion ( Intravenous Rate/Dose Verify 12/21/12 0900)    Patient Vitals for the past 24 hrs:  BP Temp Temp src Pulse Resp SpO2 Height Weight  12/21/12 1300 119/73 mmHg 97.7 F (36.5 C) Oral 87 18 99 % - -  12/21/12  0558 104/60 mmHg - - - - - - -  12/21/12 0424 89/54 mmHg 97.2 F (36.2 C) Axillary 80 16 99 % - -  12/21/12 0300 101/58 mmHg 97.7 F (36.5 C) Oral 88 16 100 % - -  12/21/12 0259 - - - - - - 5\' 10"  (1.778 m) 212 lb 9.6 oz (96.435 kg)  12/21/12 0100 106/64 mmHg - - 87 13 97 % - -  12/21/12 0045 108/69 mmHg - - 89 14 98 % - -  12/21/12 0030 108/61 mmHg - - 95 10 98 % - -  12/21/12 0015 103/62 mmHg - - 94 15 99 % - -  12/21/12 0000 - - - 91 17 98 % - -  12/20/12 2345 117/71 mmHg - - 89 14 98 % - -  12/20/12 2330 119/74 mmHg - - 89 13 98 % - -  12/20/12 2300 108/67 mmHg - - 90 17 98 % - -  12/20/12 2245 105/64 mmHg - - 95 16 99 % - -  12/20/12 2230 103/62 mmHg - - 93 16  100 % - -  12/20/12 2220 97/60 mmHg 98.7 F (37.1 C) Oral - 20 98 % - -     11:42 PM Reevaluation with update and discussion. After initial assessment and treatment, an updated evaluation reveals no further complaints. Family members are apprised of the findings and plan for admission. Parminder Cupples L     Labs Reviewed  CBC - Abnormal; Notable for the following:    Hemoglobin 12.2 (*)    HCT 37.7 (*)    All other components within normal limits  COMPREHENSIVE METABOLIC PANEL - Abnormal; Notable for the following:    Glucose, Bld 145 (*)    Creatinine, Ser 1.69 (*)    Calcium 8.3 (*)    Albumin 3.1 (*)    GFR calc non Af Amer 37 (*)    GFR calc Af Amer 43 (*)    All other components within normal limits  LACTIC ACID, PLASMA - Abnormal; Notable for the following:    Lactic Acid, Venous 2.3 (*)    All other components within normal limits  URINALYSIS, ROUTINE W REFLEX MICROSCOPIC - Abnormal; Notable for the following:    Color, Urine AMBER (*)    APPearance CLOUDY (*)    Bilirubin Urine SMALL (*)    Ketones, ur 15 (*)    Leukocytes, UA TRACE (*)    All other components within normal limits  FIBRINOGEN - Abnormal; Notable for the following:    Fibrinogen 592 (*)    All other components within normal limits   BASIC METABOLIC PANEL - Abnormal; Notable for the following:    Potassium 3.0 (*)    Glucose, Bld 105 (*)    Creatinine, Ser 1.66 (*)    GFR calc non Af Amer 38 (*)    GFR calc Af Amer 44 (*)    All other components within normal limits  CBC - Abnormal; Notable for the following:    RBC 3.89 (*)    Hemoglobin 11.3 (*)    HCT 34.2 (*)    All other components within normal limits  HEMOGLOBIN A1C - Abnormal; Notable for the following:    Hemoglobin A1C 6.2 (*)    Mean Plasma Glucose 131 (*)    All other components within normal limits  URINE MICROSCOPIC-ADD ON - Abnormal; Notable for the following:    Squamous Epithelial / LPF FEW (*)    Bacteria, UA FEW (*)    Casts HYALINE CASTS (*)    Crystals CA OXALATE CRYSTALS (*)    All other components within normal limits  GLUCOSE, CAPILLARY - Abnormal; Notable for the following:    Glucose-Capillary 124 (*)    All other components within normal limits  GLUCOSE, CAPILLARY - Abnormal; Notable for the following:    Glucose-Capillary 130 (*)    All other components within normal limits  GLUCOSE, CAPILLARY - Abnormal; Notable for the following:    Glucose-Capillary 124 (*)    All other components within normal limits  CULTURE, BLOOD (ROUTINE X 2)  CULTURE, BLOOD (ROUTINE X 2)  URINE CULTURE  CORTISOL  TROPONIN I  PROTIME-INR  APTT  CORTISOL  GLUCOSE, CAPILLARY  TYPE AND SCREEN   Dg Chest Portable 1 View  12/20/2012   *RADIOLOGY REPORT*  Clinical Data: Shortness of breath with exertion; history of diabetes.  Sepsis.  PORTABLE CHEST - 1 VIEW  Comparison: Chest radiograph performed 03/24/2012, and CT of the chest performed 04/15/2012  Findings: The lungs are mildly hypoexpanded.  Mild vascular congestion is seen.  Mild bibasilar airspace opacities raise concern for minimal interstitial edema.  No pleural effusion or pneumothorax is seen.  The cardiomediastinal silhouette is within normal limits.  No acute osseous abnormalities are seen.   Thoracic spinal stimulation leads are partially imaged.  Degenerative change is noted at both glenohumeral joints.  IMPRESSION: Lungs mildly hypoexpanded.  Mild vascular congestion seen.  Mild bibasilar airspace opacities raise concern for minimal interstitial edema.   Original Report Authenticated By: Tonia Ghent, M.D.   1. Cellulitis   2. Hypotension   3. Syncope     MDM  Diabetic foot infection with secondary hypotension, and syncope. He is admitted to the hospital for stabilization and further treatment.   Nursing Notes Reviewed/ Care Coordinated, and agree without changes. Applicable Imaging Reviewed.  Interpretation of Laboratory Data incorporated into ED treatment   Plan: Admit  Flint Melter, MD 12/21/12 1949

## 2012-12-21 ENCOUNTER — Encounter (HOSPITAL_COMMUNITY): Payer: Self-pay | Admitting: Nurse Practitioner

## 2012-12-21 DIAGNOSIS — I959 Hypotension, unspecified: Secondary | ICD-10-CM | POA: Insufficient documentation

## 2012-12-21 DIAGNOSIS — E118 Type 2 diabetes mellitus with unspecified complications: Secondary | ICD-10-CM | POA: Diagnosis present

## 2012-12-21 DIAGNOSIS — E669 Obesity, unspecified: Secondary | ICD-10-CM | POA: Diagnosis present

## 2012-12-21 DIAGNOSIS — E785 Hyperlipidemia, unspecified: Secondary | ICD-10-CM | POA: Diagnosis present

## 2012-12-21 DIAGNOSIS — E119 Type 2 diabetes mellitus without complications: Secondary | ICD-10-CM

## 2012-12-21 DIAGNOSIS — L0390 Cellulitis, unspecified: Secondary | ICD-10-CM | POA: Diagnosis present

## 2012-12-21 DIAGNOSIS — A419 Sepsis, unspecified organism: Secondary | ICD-10-CM | POA: Diagnosis present

## 2012-12-21 DIAGNOSIS — R55 Syncope and collapse: Secondary | ICD-10-CM

## 2012-12-21 DIAGNOSIS — E1169 Type 2 diabetes mellitus with other specified complication: Secondary | ICD-10-CM | POA: Diagnosis present

## 2012-12-21 DIAGNOSIS — L0291 Cutaneous abscess, unspecified: Secondary | ICD-10-CM

## 2012-12-21 LAB — COMPREHENSIVE METABOLIC PANEL
ALT: <5 U/L (ref 0–53)
AST: 10 U/L (ref 0–37)
Albumin: 3.1 g/dL — ABNORMAL LOW (ref 3.5–5.2)
Alkaline Phosphatase: 69 U/L (ref 39–117)
BUN: 21 mg/dL (ref 6–23)
CO2: 25 mEq/L (ref 19–32)
Calcium: 8.3 mg/dL — ABNORMAL LOW (ref 8.4–10.5)
Chloride: 101 mEq/L (ref 96–112)
Creatinine, Ser: 1.69 mg/dL — ABNORMAL HIGH (ref 0.50–1.35)
GFR calc Af Amer: 43 mL/min — ABNORMAL LOW (ref >90)
GFR calc non Af Amer: 37 mL/min — ABNORMAL LOW (ref >90)
Glucose, Bld: 145 mg/dL — ABNORMAL HIGH (ref 70–99)
Potassium: 3.9 mEq/L (ref 3.5–5.1)
Sodium: 137 mEq/L (ref 135–145)
Total Bilirubin: 0.5 mg/dL (ref 0.3–1.2)
Total Protein: 6.3 g/dL (ref 6.0–8.3)

## 2012-12-21 LAB — HEMOGLOBIN A1C
Hgb A1c MFr Bld: 6.2 % — ABNORMAL HIGH (ref <5.7)
Mean Plasma Glucose: 131 mg/dL — ABNORMAL HIGH (ref <117)

## 2012-12-21 LAB — CBC
HCT: 37.7 % — ABNORMAL LOW (ref 39.0–52.0)
HCT: 34.2 % — ABNORMAL LOW (ref 39.0–52.0)
Hemoglobin: 12.2 g/dL — ABNORMAL LOW (ref 13.0–17.0)
Hemoglobin: 11.3 g/dL — ABNORMAL LOW (ref 13.0–17.0)
MCH: 28.7 pg (ref 26.0–34.0)
MCH: 29 pg (ref 26.0–34.0)
MCHC: 32.4 g/dL (ref 30.0–36.0)
MCHC: 33 g/dL (ref 30.0–36.0)
MCV: 88.7 fL (ref 78.0–100.0)
MCV: 87.9 fL (ref 78.0–100.0)
Platelets: 153 10*3/uL (ref 150–400)
Platelets: 173 10*3/uL (ref 150–400)
RBC: 4.25 MIL/uL (ref 4.22–5.81)
RBC: 3.89 MIL/uL — ABNORMAL LOW (ref 4.22–5.81)
RDW: 13.9 % (ref 11.5–15.5)
RDW: 14 % (ref 11.5–15.5)
WBC: 7.8 10*3/uL (ref 4.0–10.5)
WBC: 6.9 10*3/uL (ref 4.0–10.5)

## 2012-12-21 LAB — BASIC METABOLIC PANEL
BUN: 23 mg/dL (ref 6–23)
CO2: 26 mEq/L (ref 19–32)
Calcium: 8.4 mg/dL (ref 8.4–10.5)
Chloride: 103 mEq/L (ref 96–112)
Creatinine, Ser: 1.66 mg/dL — ABNORMAL HIGH (ref 0.50–1.35)
GFR calc Af Amer: 44 mL/min — ABNORMAL LOW (ref >90)
GFR calc non Af Amer: 38 mL/min — ABNORMAL LOW (ref >90)
Glucose, Bld: 105 mg/dL — ABNORMAL HIGH (ref 70–99)
Potassium: 3 mEq/L — ABNORMAL LOW (ref 3.5–5.1)
Sodium: 139 mEq/L (ref 135–145)

## 2012-12-21 LAB — GLUCOSE, CAPILLARY
Glucose-Capillary: 124 mg/dL — ABNORMAL HIGH (ref 70–99)
Glucose-Capillary: 130 mg/dL — ABNORMAL HIGH (ref 70–99)
Glucose-Capillary: 91 mg/dL (ref 70–99)
Glucose-Capillary: 124 mg/dL — ABNORMAL HIGH (ref 70–99)
Glucose-Capillary: 108 mg/dL — ABNORMAL HIGH (ref 70–99)

## 2012-12-21 LAB — URINALYSIS, ROUTINE W REFLEX MICROSCOPIC
Glucose, UA: NEGATIVE mg/dL
Hgb urine dipstick: NEGATIVE
Ketones, ur: 15 mg/dL — AB
Nitrite: NEGATIVE
Protein, ur: NEGATIVE mg/dL
Specific Gravity, Urine: 1.021 (ref 1.005–1.030)
Urobilinogen, UA: 1 mg/dL (ref 0.0–1.0)
pH: 5 (ref 5.0–8.0)

## 2012-12-21 LAB — TYPE AND SCREEN
ABO/RH(D): O POS
Antibody Screen: NEGATIVE

## 2012-12-21 LAB — FIBRINOGEN: Fibrinogen: 592 mg/dL — ABNORMAL HIGH (ref 204–475)

## 2012-12-21 LAB — TROPONIN I: Troponin I: <0.3 ng/mL (ref <0.30)

## 2012-12-21 LAB — APTT: aPTT: 24 seconds (ref 24–37)

## 2012-12-21 LAB — URINE MICROSCOPIC-ADD ON

## 2012-12-21 LAB — LACTIC ACID, PLASMA: Lactic Acid, Venous: 2.3 mmol/L — ABNORMAL HIGH (ref 0.5–2.2)

## 2012-12-21 LAB — CORTISOL
Cortisol, Plasma: 24.5 ug/dL
Cortisol, Plasma: 7.1 ug/dL

## 2012-12-21 LAB — PROTIME-INR
INR: 1.03 (ref 0.00–1.49)
Prothrombin Time: 13.3 seconds (ref 11.6–15.2)

## 2012-12-21 MED ORDER — BRIMONIDINE TARTRATE 0.2 % OP SOLN
1.0000 [drp] | Freq: Three times a day (TID) | OPHTHALMIC | Status: DC
  Administered 2012-12-21 – 2012-12-23 (×5): 1 [drp] via OPHTHALMIC
  Filled 2012-12-21: qty 5

## 2012-12-21 MED ORDER — VANCOMYCIN HCL 10 G IV SOLR
1500.0000 mg | INTRAVENOUS | Status: DC
  Administered 2012-12-21 – 2012-12-29 (×9): 1500 mg via INTRAVENOUS
  Filled 2012-12-21 (×12): qty 1500

## 2012-12-21 MED ORDER — SODIUM CHLORIDE 0.9 % IV SOLN
INTRAVENOUS | Status: DC
  Administered 2012-12-21: 02:00:00 via INTRAVENOUS

## 2012-12-21 MED ORDER — HYDROXYZINE HCL 10 MG PO TABS
10.0000 mg | ORAL_TABLET | Freq: Four times a day (QID) | ORAL | Status: DC | PRN
  Administered 2012-12-21: 10 mg via ORAL
  Filled 2012-12-21: qty 1

## 2012-12-21 MED ORDER — SODIUM CHLORIDE 0.9 % IV SOLN
INTRAVENOUS | Status: AC
  Administered 2012-12-21: 03:00:00 via INTRAVENOUS

## 2012-12-21 MED ORDER — DEXTROSE 5 % IV SOLN
1.0000 g | Freq: Two times a day (BID) | INTRAVENOUS | Status: DC
  Administered 2012-12-21 – 2012-12-28 (×16): 1 g via INTRAVENOUS
  Filled 2012-12-21 (×18): qty 1

## 2012-12-21 MED ORDER — FLUCONAZOLE 150 MG PO TABS
150.0000 mg | ORAL_TABLET | Freq: Every day | ORAL | Status: AC
  Administered 2012-12-21 – 2012-12-25 (×5): 150 mg via ORAL
  Filled 2012-12-21 (×5): qty 1

## 2012-12-21 MED ORDER — CYCLOPENTOLATE HCL 1 % OP SOLN
1.0000 [drp] | Freq: Three times a day (TID) | OPHTHALMIC | Status: DC
  Administered 2012-12-21 – 2012-12-30 (×26): 1 [drp] via OPHTHALMIC
  Filled 2012-12-21: qty 2

## 2012-12-21 MED ORDER — OXYCODONE HCL 5 MG PO TABS
5.0000 mg | ORAL_TABLET | ORAL | Status: DC | PRN
  Administered 2012-12-22 – 2012-12-29 (×12): 5 mg via ORAL
  Filled 2012-12-21 (×12): qty 1

## 2012-12-21 MED ORDER — ZOLPIDEM TARTRATE 5 MG PO TABS
5.0000 mg | ORAL_TABLET | Freq: Every evening | ORAL | Status: DC | PRN
  Administered 2012-12-22 (×2): 5 mg via ORAL
  Filled 2012-12-21 (×2): qty 1

## 2012-12-21 MED ORDER — ONDANSETRON HCL 4 MG/2ML IJ SOLN: 4.0000 mg | Freq: Four times a day (QID) | INTRAMUSCULAR | Status: DC | PRN

## 2012-12-21 MED ORDER — ACETAMINOPHEN 650 MG RE SUPP: 650.0000 mg | Freq: Four times a day (QID) | RECTAL | Status: DC | PRN

## 2012-12-21 MED ORDER — POLYVINYL ALCOHOL 1.4 % OP SOLN
1.0000 [drp] | Freq: Three times a day (TID) | OPHTHALMIC | Status: DC
  Administered 2012-12-21 – 2012-12-30 (×26): 1 [drp] via OPHTHALMIC
  Filled 2012-12-21: qty 15

## 2012-12-21 MED ORDER — BRINZOLAMIDE-BRIMONIDINE 1-0.2 % OP SUSP: 1.0000 [drp] | Freq: Three times a day (TID) | OPHTHALMIC | Status: DC

## 2012-12-21 MED ORDER — ONDANSETRON HCL 4 MG PO TABS: 4.0000 mg | ORAL_TABLET | Freq: Four times a day (QID) | ORAL | Status: DC | PRN

## 2012-12-21 MED ORDER — ENOXAPARIN SODIUM 40 MG/0.4ML ~~LOC~~ SOLN
40.0000 mg | SUBCUTANEOUS | Status: DC
  Administered 2012-12-21 – 2012-12-30 (×10): 40 mg via SUBCUTANEOUS
  Filled 2012-12-21 (×12): qty 0.4

## 2012-12-21 MED ORDER — DORZOLAMIDE HCL 2 % OP SOLN
1.0000 [drp] | Freq: Three times a day (TID) | OPHTHALMIC | Status: DC
  Administered 2012-12-21 – 2012-12-30 (×26): 1 [drp] via OPHTHALMIC
  Filled 2012-12-21: qty 10

## 2012-12-21 MED ORDER — ACETAMINOPHEN 325 MG PO TABS
650.0000 mg | ORAL_TABLET | Freq: Four times a day (QID) | ORAL | Status: DC | PRN
  Administered 2012-12-21 – 2012-12-24 (×2): 650 mg via ORAL
  Filled 2012-12-21 (×2): qty 2

## 2012-12-21 MED ORDER — BRINZOLAMIDE 1 % OP SUSP
1.0000 [drp] | Freq: Three times a day (TID) | OPHTHALMIC | Status: DC
  Administered 2012-12-21 – 2012-12-23 (×5): 1 [drp] via OPHTHALMIC
  Filled 2012-12-21: qty 10

## 2012-12-21 MED ORDER — INSULIN ASPART 100 UNIT/ML ~~LOC~~ SOLN
0.0000 [IU] | Freq: Three times a day (TID) | SUBCUTANEOUS | Status: DC
  Administered 2012-12-21 – 2012-12-26 (×10): 1 [IU] via SUBCUTANEOUS
  Administered 2012-12-27 – 2012-12-29 (×5): 2 [IU] via SUBCUTANEOUS
  Administered 2012-12-29: 3 [IU] via SUBCUTANEOUS
  Administered 2012-12-30 (×2): 2 [IU] via SUBCUTANEOUS

## 2012-12-21 MED ORDER — POLYETHYL GLYCOL-PROPYL GLYCOL 0.4-0.3 % OP SOLN: Freq: Three times a day (TID) | OPHTHALMIC | Status: DC

## 2012-12-21 MED ORDER — INSULIN ASPART 100 UNIT/ML ~~LOC~~ SOLN
0.0000 [IU] | Freq: Every day | SUBCUTANEOUS | Status: DC
  Administered 2012-12-29: 2 [IU] via SUBCUTANEOUS

## 2012-12-21 MED ORDER — HYDROMORPHONE HCL PF 1 MG/ML IJ SOLN
0.5000 mg | INTRAMUSCULAR | Status: DC | PRN
  Administered 2012-12-22 – 2012-12-26 (×5): 1 mg via INTRAVENOUS
  Filled 2012-12-21 (×5): qty 1

## 2012-12-21 MED ORDER — ALUM & MAG HYDROXIDE-SIMETH 200-200-20 MG/5ML PO SUSP: 30.0000 mL | Freq: Four times a day (QID) | ORAL | Status: DC | PRN

## 2012-12-21 NOTE — ED Notes (Signed)
[facility] paged regarding admission orders. Cardiac monitoring order needed for admission.

## 2012-12-21 NOTE — Progress Notes (Signed)
Initial dressings to bilateral feet/toes completed. Minimal serosanguinous drainage noted to bilateral toes during cleansing, right > left. Dressings currently clean, dry and intact. Patient tolerated well. No complaints of pain, currently resting comfortably.  Will continue to monitor. Troy Sine

## 2012-12-21 NOTE — Progress Notes (Signed)
Patient arrived to unit from ED via stretcher. Patient was not ambulatory d/t pain to feet when attempting to ambulate from blisters to toes and the soles of bilateral feet.  Patient alert and oriented, fall and safety plan reviewed with patient.  Admission weight and vitals completed.  Bed alarm in use and call light within reach.  Patient currently resting comfortably, will continue to monitor. Blood pressure 101/58, pulse 88, temperature 97.7 F (36.5 C), temperature source Oral, resp. rate 16, height 5\' 10"  (1.778 m), weight 96.435 kg (212 lb 9.6 oz), SpO2 100.00%. Troy Sine

## 2012-12-21 NOTE — ED Notes (Signed)
Pt resting with eyes closed.  Pt denies any pain or discomfort at this time.

## 2012-12-21 NOTE — Progress Notes (Signed)
ANTIBIOTIC CONSULT NOTE - INITIAL  Pharmacy Consult for Vancocin Indication: rule out sepsis and cellulitis  No Known Allergies  Patient Measurements: Weight: ~110kg  Vital Signs: Temp: 98.7 F (37.1 C) (07/19 2220) Temp src: Oral (07/19 2220) BP: 119/74 mmHg (07/19 2330) Pulse Rate: 89 (07/19 2330)  Labs:  Recent Labs  12/20/12 2324  WBC 7.8  HGB 12.2*  PLT 153  CREATININE 1.69*    Microbiology: No results found for this or any previous visit (from the past 720 hour(s)).  Medical History: Past Medical History  Diagnosis Date  . Gout     Right foot and leg  . Shortness of breath     with exersertion  . Sleep apnea   . Diabetes mellitus   . Hypertension   . Hyperlipidemia   . Gastric ulcer     50 Years ago  . Arthritis     All Joints  . Insomnia   . Glaucoma     Assessment: 79yo male was in normal state of health when family witnessed him "pass out" and become unresponsive, EMS found BP down to 50s, now A&Ox4 w/ BP to 97, to begin IV ABX for possible sepsis and cellulitis.  Goal of Therapy:  Vancomycin trough level 15-20 mcg/ml  Plan:  Rec'd vanc 1g in ED; will continue with vancomycin 1500mg  IV Q24H and monitor CBC, Cx, levels prn.  Vernard Gambles, PharmD, BCPS  12/21/2012,12:56 AM

## 2012-12-21 NOTE — Accreditation Note (Signed)
Patient having c/o itching to toes. Dr Joseph Art notified, received order for atarax. Will administer as ordered and continue to monitor. Troy Sine

## 2012-12-21 NOTE — H&P (Signed)
[facility] History and Physical  [name] AVW:[id number] DOB: [birth year] DOA: 12/20/2012  Referring physician:  EDP PCP: Berenda Morale, MD  Specialists:   Chief Complaint: Passed Out  HPI: [name] is a 79 y.o. male with  Multiple Medical problems including Diabetes, HTN, Diabetic Neuropathy, and Gout who was sent to the ED after suffering a syncopal episode in his home at around 8:30 pm that was witnessed by his wife.   He "states that a wave came over him" and he blacked out.  He denies having chest pain or headache or dizziness prior to the episode.  He denies having any fevers or chills or nausea vomiting or diarrhea.   He was evaluated in the ED and was found to have  hypotension and on examination he was found to have erythema and wet skin breakdown of both feet along with hemorrhagic blisters on the sole of his Left foot.  Laboratory studies revealed an elevated lactate level and he was placed on antibiotic coverage for Cellulitis and Sepsis.  Wit IV Vancomycin and Cefepime and was referred for medical admission.     Review of Systems: The patient denies anorexia, fever, chills, headaches, weight loss, vision loss, diplopia, dizziness, decreased hearing, rhinitis, hoarseness, chest pain, syncope, dyspnea on exertion, peripheral edema, balance deficits, cough, hemoptysis, abdominal pain, nausea, vomiting, diarrhea, constipation, hematemesis, melena, hematochezia, severe indigestion/heartburn, dysuria, hematuria, incontinence, muscle weakness, suspicious skin lesions, transient blindness, difficulty walking, depression, unusual weight change, abnormal bleeding, enlarged lymph nodes, angioedema, and breast masses.    Past Medical History  Diagnosis Date  . Gout     Right foot and leg  . Shortness of breath     with exersertion  . Sleep apnea   . Diabetes mellitus   . Hypertension   . Hyperlipidemia   . Gastric ulcer     50 Years ago  . Arthritis     All Joints   . Insomnia   . Glaucoma   . Morbid obesity     Past Surgical History  Procedure Laterality Date  . Cataract extraction w/ intraocular lens  implant, bilateral    . Knee arthroscopy      1990'  . Back surgery    . Glaucoma surgery      bil  . Mini shunt insertion  09/26/2011    Procedure: INSERTION OF MINI SHUNT;  Surgeon: Chalmers Guest, MD;  Location: [facility] OR;  Service: Ophthalmology;  Laterality: Right;  Insertion of Ahmed shunt    Prior to Admission medications   Medication Sig Start Date End Date Taking? Authorizing Provider  allopurinol (ZYLOPRIM) 300 MG tablet Take 300 mg by mouth daily.   Yes Historical Provider, MD  amLODipine (NORVASC) 5 MG tablet Take 5 mg by mouth daily.   Yes Historical Provider, MD  Brinzolamide-Brimonidine [facility]) 1-0.2 % SUSP Apply 1 drop to eye 3 (three) times daily.   Yes Historical Provider, MD  cyclopentolate (CYCLODRYL,CYCLOGYL) 1 % ophthalmic solution Place 1 drop into both eyes 3 (three) times daily.   Yes Historical Provider, MD  dorzolamide (TRUSOPT) 2 % ophthalmic solution Place 1 drop into the right eye 3 (three) times daily.    Yes Historical Provider, MD  gabapentin (NEURONTIN) 800 MG tablet Take 800 mg by mouth daily.    Yes Historical Provider, MD  ketoconazole (NIZORAL) 2 % cream Apply 1 application topically 2 (two) times daily.    Yes Historical Provider, MD  losartan-hydrochlorothiazide (HYZAAR) 100-25 MG per tablet Take 1  tablet by mouth daily.   Yes Historical Provider, MD  metFORMIN (GLUCOPHAGE) 1000 MG tablet Take 1,000 mg by mouth 2 (two) times daily with a meal.   Yes Historical Provider, MD  oxyCODONE-acetaminophen (PERCOCET) 10-325 MG per tablet Take 1 tablet by mouth every 8 (eight) hours as needed for pain.   Yes Historical Provider, MD  Polyethyl Glycol-Propyl Glycol (SYSTANE OP) Place 1 drop into the left eye 3 (three) times daily.   Yes Historical Provider, MD  pravastatin (PRAVACHOL) 40 MG tablet Take 40 mg by mouth  daily.   Yes Historical Provider, MD  Tamsulosin HCl (FLOMAX) 0.4 MG CAPS Take 0.4 mg by mouth daily.   Yes Historical Provider, MD    No Known Allergies  Social History:  reports that he quit smoking about 30 years ago. His smoking use included Cigarettes. He smoked 2.00 packs per day. He has never used smokeless tobacco. He reports that he does not drink alcohol or use illicit drugs.     Family History  Problem Relation Age of Onset  . Anesthesia problems Neg Hx     (be sure to complete)   Physical Exam:  GEN:  Pleasant  Elderly Morbidly Obese 79 y.o.  [place] male  examined  and in no acute distress; cooperative with exam Filed Vitals:   12/21/12 0045 12/21/12 0100 12/21/12 0259 12/21/12 0300  BP: 108/69 106/64  101/58  Pulse: 89 87  88  Temp:    97.7 F (36.5 C)  TempSrc:    Oral  Resp: 14 13  16   Height:   5\' 10"  (1.778 m)   Weight:   96.435 kg (212 lb 9.6 oz)   SpO2: 98% 97%  100%   Blood pressure 101/58, pulse 88, temperature 97.7 F (36.5 C), temperature source Oral, resp. rate 16, height 5\' 10"  (1.778 m), weight 96.435 kg (212 lb 9.6 oz), SpO2 100.00%. PSYCH: He is alert and oriented x4; does not appear anxious does not appear depressed; affect is normal HEENT: Normocephalic and Atraumatic, Mucous membranes pink; PERRLA; EOM intact; Fundi:  Unable to visualize;  No scleral icterus, Nares: Patent, Oropharynx: Clear, Fair Dentition, Neck:  FROM, no cervical lymphadenopathy nor thyromegaly or carotid bruit; no JVD; Breasts:: Not examined CHEST WALL: No tenderness CHEST: Normal respiration, clear to auscultation bilaterally HEART: Regular rate and rhythm; no murmurs rubs or gallops BACK: No kyphosis or scoliosis; no CVA tenderness ABDOMEN: Positive Bowel Sounds, , Obese, soft non-tender; no masses, no organomegaly, no pannus; no intertriginous candida. Rectal Exam: Not done EXTREMITIES: No cyanosis, clubbing, +EDEMA and Infammation of Bilateral Feet with + wet  boggy fluctuant skin breakdown between toes and + hemorrhagic large blisters on plantar surface of  The Left Foot Genitalia: not examined PULSES: 2+ and symmetric SKIN: Normal hydration no rash or ulceration CNS: Cranial nerves 2-12 grossly intact except for Blindness in the Right Eye and Decreased Vison in the LEft Eye,otherwise, no focal neurologic deficit    Labs on Admission:  Basic Metabolic Panel:  Recent Labs Lab 12/20/12 2324  NA 137  K 3.9  CL 101  CO2 25  GLUCOSE 145*  BUN 21  CREATININE 1.69*  CALCIUM 8.3*   Liver Function Tests:  Recent Labs Lab 12/20/12 2324  AST 10  ALT <5  ALKPHOS 69  BILITOT 0.5  PROT 6.3  ALBUMIN 3.1*   No results found for this basename: LIPASE, AMYLASE,  in the last 168 hours No results found for this basename: AMMONIA,  in the last 168 hours CBC:  Recent Labs Lab 12/20/12 2324  WBC 7.8  HGB 12.2*  HCT 37.7*  MCV 88.7  PLT 153   Cardiac Enzymes:  Recent Labs Lab 12/20/12 2323  TROPONINI <0.30    BNP (last 3 results) No results found for this basename: PROBNP,  in the last 8760 hours CBG:  Recent Labs Lab 12/21/12 0312  GLUCAP 124*    Radiological Exams on Admission: Dg Chest Portable 1 View  12/20/2012   *RADIOLOGY REPORT*  Clinical Data: Shortness of breath with exertion; history of diabetes.  Sepsis.  PORTABLE CHEST - 1 VIEW  Comparison: Chest radiograph performed 03/24/2012, and CT of the chest performed 04/15/2012  Findings: The lungs are mildly hypoexpanded.  Mild vascular congestion is seen.  Mild bibasilar airspace opacities raise concern for minimal interstitial edema.  No pleural effusion or pneumothorax is seen.  The cardiomediastinal silhouette is within normal limits.  No acute osseous abnormalities are seen.  Thoracic spinal stimulation leads are partially imaged.  Degenerative change is noted at both glenohumeral joints.  IMPRESSION: Lungs mildly hypoexpanded.  Mild vascular congestion seen.  Mild  bibasilar airspace opacities raise concern for minimal interstitial edema.   Original Report Authenticated By: Tonia Ghent, M.D.      Assessment/Plan Principal Problem:   Sepsis Active Problems:   Cellulitis   Hypotension   Syncope   Diabetes mellitus   Hyperlipidemia   Morbid obesity    1.  Sepsis/Cellulitis-  Source from the Cellulitis of both feet,  Blood culures were sent in the ED and placed on IV Antibiotic therapy of Vancomycin and Cefepime, and Oral Fluconazole ordered to cover fungal elements.  Wound Care Consult needed for Feet  2.  Hypotension-  Due to #1, IVFs for Fluid Resuscitation.    3.  Syncope due to #2.    4.  DM2-  Check HbA1C, and Add SSI coverage PRN, Hold metformin Rx in event of Contrast imaging studies.    5.  Hyperlipidemia-  Continue   6.  Morbid Obesity-  Chronic, needs to lose weight.          Code Status:   FULL CODE Family Communication:    No Family at Bedside Disposition Plan:    Return to Home on Discharge  Time spent:  36 Minutes  Ron Parker [facility] Pager [phone]  If 7PM-7AM, please contact night-coverage [email or web address] Password [facility] 12/21/2012, 3:38 AM

## 2012-12-21 NOTE — Consult Note (Signed)
WOC consult Note Reason for Consult: Open areas between toes on bilateral fee, hemorragic blisters on both feet, plantar aspect, R>L Wound type: Fungal (infectious) vs vascular insufficiency/infarct Pressure Ulcer POA: No Measurement: Right foot with full thickness tissue loss between the great toe and the second toes, partial thickness skin loss between all other digits secondary to moisture (intertriginous dermatitis: ITD). Hemmorhagic blisters present on the plantar aspect of the right foot measuring 12cm x 8cm (extending to the midfoot). Left foot with a less acute presentation of the same condition as the right: partial thickness tissue loss between all digits secondary to moisture and a smaller area of hemorragic blisters on the plantar aspect measuring 4cm x 6cm. Wound ZOX:WRUEA, pink, painful, with thin yellow exudate from open areas between toes (bilaterally) Drainage (amount, consistency, odor) See above. Periwound: intact, macerated. Dressing procedure/placement/frequency: We will provide orders for therapy with a silver hydrofiber to provide topical antimicrobial coverage as well as absorbency to the intertriginous areas of toes.  The dressing will be secured with 4x4s and with a Kerlix wrap and changed daily.  It is possible that there is an exacerbation of chronic vascular changes occuring with this patient's feet and it is suggested that a vascular consult be considered for further workup regarding the circulatory status of this gentleman's lower extremities.  Further intervention is outside the scope of the WOC Nurse. The WOC Nursing team will not follow, but will remain available as needed to this patient, as well as his medical and nursing treams.  Please re-consult if needed. Thanks, Ladona Mow, MSN, RN, [facility], CWOCN [phone])

## 2012-12-21 NOTE — Progress Notes (Signed)
Attempted to call ED RN to get report, RN currently busy attending to another patient and stated she would call back to give report. Troy Sine

## 2012-12-22 DIAGNOSIS — E119 Type 2 diabetes mellitus without complications: Secondary | ICD-10-CM

## 2012-12-22 DIAGNOSIS — A419 Sepsis, unspecified organism: Secondary | ICD-10-CM

## 2012-12-22 DIAGNOSIS — I959 Hypotension, unspecified: Secondary | ICD-10-CM

## 2012-12-22 LAB — GLUCOSE, CAPILLARY
Glucose-Capillary: 77 mg/dL (ref 70–99)
Glucose-Capillary: 128 mg/dL — ABNORMAL HIGH (ref 70–99)
Glucose-Capillary: 112 mg/dL — ABNORMAL HIGH (ref 70–99)
Glucose-Capillary: 117 mg/dL — ABNORMAL HIGH (ref 70–99)

## 2012-12-22 LAB — URINE CULTURE
Colony Count: NO GROWTH
Culture: NO GROWTH

## 2012-12-22 MED ORDER — POTASSIUM CHLORIDE CRYS ER 10 MEQ PO TBCR
10.0000 meq | EXTENDED_RELEASE_TABLET | Freq: Every day | ORAL | Status: DC
  Administered 2012-12-22 – 2012-12-30 (×9): 10 meq via ORAL
  Filled 2012-12-22 (×10): qty 1

## 2012-12-22 NOTE — Progress Notes (Signed)
[facility] PROGRESS NOTE  [name] ZOX:[id number] DOB: 1930-06-28 DOA: 12/20/2012 PCP: Berenda Morale, MD  Assessment/Plan: 1. Cellulitis; bilateral feet continue antibiotics 2. DM; continue to control with SSI, obtain hemoglobin A1c 3. HLD obtain lipid panel 4. Sepsis see #1 5. DVT rule out; obtain bilateral lower extremity Dopplers  Code Status: Full  Consultants:  Procedures:  Bilat Lower Extremity pain; Ordered Bilat LE Venous Doppler   Antibiotics:  Maxipime, fluconazole, vancomycin day2  HPI/Subjective: [name] is a 79 y.o. BM PMHx sepsis, hypotension, legally blind secondary to glaucoma, DM, DM neuropathy, gout HLD, bilateral feet cellulitis. Patient  sent to the ED after suffering a syncopal episode in his home at around 8:30 pm that was witnessed by his wife. He "states that a wave came over him" and he blacked out. He denies having chest pain or headache or dizziness prior to the episode. He denies having any fevers or chills or nausea vomiting or diarrhea. He was evaluated in the ED and was found to have hypotension and on examination he was found to have erythema and wet skin breakdown of both feet along with hemorrhagic blisters on the sole of his Left foot. Laboratory studies revealed an elevated lactate level and he was placed on antibiotic coverage for Cellulitis and Sepsis. With IV Vancomycin and Cefepime and was referred for medical admission. NOTE after seeing patient in a.m. nurse paged me to patient's bedside patient was complaining of bilateral calf pain Lt> Rt and on palpation pain consistent with possible DVT   Objective: Filed Vitals:   12/21/12 1300 12/21/12 2044 12/22/12 0620 12/22/12 0627  BP: 119/73 117/76  130/80  Pulse: 87 94  88  Temp: 97.7 F (36.5 C) 99.4 F (37.4 C)  98.9 F (37.2 C)  TempSrc: Oral Oral  Oral  Resp: 18 19  18   Height:      Weight:   95.6 kg (210 lb 12.2 oz)   SpO2: 99% 99%  98%    Intake/Output  Summary (Last 24 hours) at 12/22/12 0647 Last data filed at 12/22/12 8119  Gross per 24 hour  Intake    870 ml  Output   2626 ml  Net  -1756 ml   Filed Weights   12/21/12 0259 12/22/12 0620  Weight: 96.435 kg (212 lb 9.6 oz) 95.6 kg (210 lb 12.2 oz)    Exam:   General:  Alert,NAD  Cardiovascular: Regular rhythm and rate, negative murmurs rubs gallops, DP/PT pulse one plus bilateral  Respiratory: Good auscultation bilaterally  Abdomen: Obese, soft, nontender, plus bowel sounds  Musculoskeletal: Bilateral foot cellulitis Rt > Lt. Erythema, skin breakdown between all toes, on the plantar surface of right foot multiple areas of purplish colored skin NOTE later in day patient complained of bilateral lower extremity pain in the calf sensitive to palpation  Data Reviewed: Basic Metabolic Panel:  Recent Labs Lab 12/20/12 2324 12/21/12 0410  NA 137 139  K 3.9 3.0*  CL 101 103  CO2 25 26  GLUCOSE 145* 105*  BUN 21 23  CREATININE 1.69* 1.66*  CALCIUM 8.3* 8.4   Liver Function Tests:  Recent Labs Lab 12/20/12 2324  AST 10  ALT <5  ALKPHOS 69  BILITOT 0.5  PROT 6.3  ALBUMIN 3.1*   No results found for this basename: LIPASE, AMYLASE,  in the last 168 hours No results found for this basename: AMMONIA,  in the last 168 hours CBC:  Recent Labs Lab 12/20/12 2324 12/21/12 0410  WBC  7.8 6.9  HGB 12.2* 11.3*  HCT 37.7* 34.2*  MCV 88.7 87.9  PLT 153 173   Cardiac Enzymes:  Recent Labs Lab 12/20/12 2323  TROPONINI <0.30   BNP (last 3 results) No results found for this basename: PROBNP,  in the last 8760 hours CBG:  Recent Labs Lab 12/21/12 0312 12/21/12 0612 12/21/12 1101 12/21/12 1554 12/21/12 2044  GLUCAP 124* 130* 91 124* 108*    No results found for this or any previous visit (from the past 240 hour(s)).   Studies: Dg Chest Portable 1 View  12/20/2012   *RADIOLOGY REPORT*  Clinical Data: Shortness of breath with exertion; history of diabetes.   Sepsis.  PORTABLE CHEST - 1 VIEW  Comparison: Chest radiograph performed 03/24/2012, and CT of the chest performed 04/15/2012  Findings: The lungs are mildly hypoexpanded.  Mild vascular congestion is seen.  Mild bibasilar airspace opacities raise concern for minimal interstitial edema.  No pleural effusion or pneumothorax is seen.  The cardiomediastinal silhouette is within normal limits.  No acute osseous abnormalities are seen.  Thoracic spinal stimulation leads are partially imaged.  Degenerative change is noted at both glenohumeral joints.  IMPRESSION: Lungs mildly hypoexpanded.  Mild vascular congestion seen.  Mild bibasilar airspace opacities raise concern for minimal interstitial edema.   Original Report Authenticated By: Tonia Ghent, M.D.    Scheduled Meds: . brinzolamide  1 drop Left Eye TID   And  . brimonidine  1 drop Left Eye TID  . ceFEPime (MAXIPIME) IV  1 g Intravenous Q12H  . cyclopentolate  1 drop Both Eyes TID  . dorzolamide  1 drop Right Eye TID  . enoxaparin (LOVENOX) injection  40 mg Subcutaneous Q24H  . fluconazole  150 mg Oral Daily  . insulin aspart  0-5 Units Subcutaneous QHS  . insulin aspart  0-9 Units Subcutaneous TID WC  . polyvinyl alcohol  1 drop Left Eye TID  . vancomycin  1,500 mg Intravenous Q24H   Continuous Infusions:   Principal Problem:   Sepsis Active Problems:   Cellulitis   Hypotension   Syncope   Diabetes mellitus   Hyperlipidemia   Morbid obesity    Time spent: 30 minutes    Jentri Aye, J  [facility] Pager [phone]. If 7PM-7AM, please contact night-coverage at [email or web address], password [email or web address] 12/22/2012, 6:47 AM  LOS: 2 days

## 2012-12-22 NOTE — Progress Notes (Signed)
Pt given ambien and dilaudid this PM, at time pt oriented to place and situation. Pt now states that he needs to get in bed and states he is at home. Pt oriented to situation and hospital. Pt placed on a more sensitive bed alarm setting. Will continue to monitor pt. Baron Hamper, RN

## 2012-12-22 NOTE — Progress Notes (Signed)
Notified Dr. Joseph Art that patient is complaining of pain in left lower extremity.

## 2012-12-22 NOTE — Progress Notes (Signed)
Utilization Review Completed [place] Ahart J. Alline Pio, RN, BSN, NCM [phone]  

## 2012-12-22 NOTE — Progress Notes (Signed)
Reinforced bandages to bilateral feet

## 2012-12-23 DIAGNOSIS — M79609 Pain in unspecified limb: Secondary | ICD-10-CM

## 2012-12-23 DIAGNOSIS — E876 Hypokalemia: Secondary | ICD-10-CM | POA: Diagnosis present

## 2012-12-23 DIAGNOSIS — M109 Gout, unspecified: Secondary | ICD-10-CM | POA: Diagnosis present

## 2012-12-23 LAB — GLUCOSE, CAPILLARY
Glucose-Capillary: 103 mg/dL — ABNORMAL HIGH (ref 70–99)
Glucose-Capillary: 149 mg/dL — ABNORMAL HIGH (ref 70–99)
Glucose-Capillary: 148 mg/dL — ABNORMAL HIGH (ref 70–99)
Glucose-Capillary: 125 mg/dL — ABNORMAL HIGH (ref 70–99)

## 2012-12-23 LAB — LIPID PANEL
Cholesterol: 135 mg/dL (ref 0–200)
HDL: 48 mg/dL (ref >39)
LDL Cholesterol: 77 mg/dL (ref 0–99)
Total CHOL/HDL Ratio: 2.8 RATIO
Triglycerides: 51 mg/dL (ref <150)
VLDL: 10 mg/dL (ref 0–40)

## 2012-12-23 LAB — URIC ACID: Uric Acid, Serum: 4.2 mg/dL (ref 4.0–7.8)

## 2012-12-23 LAB — POTASSIUM: Potassium: 3.4 mEq/L — ABNORMAL LOW (ref 3.5–5.1)

## 2012-12-23 MED ORDER — BRINZOLAMIDE 1 % OP SUSP
1.0000 [drp] | Freq: Three times a day (TID) | OPHTHALMIC | Status: DC
  Administered 2012-12-23 – 2012-12-30 (×21): 1 [drp] via OPHTHALMIC
  Filled 2012-12-23: qty 10

## 2012-12-23 MED ORDER — BRIMONIDINE TARTRATE 0.2 % OP SOLN
1.0000 [drp] | Freq: Three times a day (TID) | OPHTHALMIC | Status: DC
  Administered 2012-12-23 – 2012-12-30 (×21): 1 [drp] via OPHTHALMIC
  Filled 2012-12-23: qty 5

## 2012-12-23 MED ORDER — POTASSIUM CHLORIDE 10 MEQ/100ML IV SOLN
10.0000 meq | INTRAVENOUS | Status: AC
  Administered 2012-12-23 (×2): 10 meq via INTRAVENOUS
  Filled 2012-12-23 (×2): qty 100

## 2012-12-23 MED ORDER — SODIUM CHLORIDE 0.9 % IJ SOLN: 10.0000 mL | INTRAMUSCULAR | Status: DC | PRN

## 2012-12-23 NOTE — Evaluation (Signed)
Physical Therapy Evaluation Patient Details Name: [name] MRN: [id number] DOB: 1930/08/17 Today's Date: 12/23/2012 Time: 4098-1191 PT Time Calculation (min): 14 min  PT Assessment / Plan / Recommendation History of Present Illness  79 y.o. BM PMHx sepsis, hypotension, legally blind secondary to glaucoma, DM, DM neuropathy, gout HLD, bilateral feet cellulitis. Patient  sent to the ED after suffering a syncopal episode in his home at around 8:30 pm that was witnessed by his wife  Clinical Impression  Pt admitted with above. Pt currently with functional limitations due to the deficits listed below (see PT Problem List). Eval limited by pain and fatigue as pt received pain meds one hour prior to session and reports being very tired. Pt will benefit from skilled PT to increase their independence and safety with mobility to allow discharge to the venue listed below.      PT Assessment       Follow Up Recommendations  Supervision/Assistance - 24 hour;SNF    Does the patient have the potential to tolerate intense rehabilitation      Barriers to Discharge        Equipment Recommendations  None recommended by PT    Recommendations for Other Services     Frequency      Precautions / Restrictions Precautions Precautions: Fall   Pertinent Vitals/Pain Max c/o L posterior knee pain and bilateral feet pain, premedicated, RN in room for dressing change upon leaving room      Mobility  Bed Mobility Bed Mobility: Rolling Left;Rolling Right;Scooting to HOB Rolling Right: 1: +2 Total assist Rolling Right: Patient Percentage: 0% Rolling Left: 1: +2 Total assist Rolling Left: Patient Percentage: 0% Scooting to HOB: 1: +2 Total assist Scooting to [facility]: Patient Percentage: 0% Details for Bed Mobility Assistance: pt given verbal and tactile cues however reports "just wants to sleep" and states bed mobility painful in LEs so required +2 assist Transfers Transfers: Not assessed (pt  adamantly declined)    Exercises     PT Diagnosis:    PT Problem List:   PT Treatment Interventions:       PT Goals(Current goals can be found in the care plan section) Acute Rehab PT Goals PT Goal Formulation: With patient Time For Goal Achievement: 01/06/13 Potential to Achieve Goals: Fair  Visit Information  Last PT Received On: 12/23/12 Assistance Needed: +2 PT/OT Co-Evaluation/Treatment: Yes History of Present Illness: 79 y.o. BM PMHx sepsis, hypotension, legally blind secondary to glaucoma, DM, DM neuropathy, gout HLD, bilateral feet cellulitis. Patient  sent to the ED after suffering a syncopal episode in his home at around 8:30 pm that was witnessed by his wife       Prior Functioning  Home Living Family/patient expects to be discharged to:: Private residence Living Arrangements: Spouse/significant other Type of Home: House Home Access: Stairs to enter [employer] of Steps: 7-8 Entrance Stairs-Rails: Right Home Layout: One level Home Equipment: Walker - 4 wheels;Wheelchair - manual (rollator) Prior Function Level of Independence: Independent with assistive device(s) Communication Communication: No difficulties    Cognition  Cognition Arousal/Alertness: Lethargic;Suspect due to medications Overall Cognitive Status: No family/caregiver present to determine baseline cognitive functioning Area of Impairment: Orientation Orientation Level: Place;Time;Situation;Disoriented to    Extremity/Trunk Assessment Lower Extremity Assessment Lower Extremity Assessment: Generalized weakness;LLE deficits/detail;RLE deficits/detail RLE Deficits / Details:  assist with LEs for bed mobility RLE: Unable to fully assess due to pain LLE Deficits / Details: pain behind knee with extension, assist with LEs for bed mobility LLE:  Unable to fully assess due to pain   Balance    End of Session PT - End of Session Activity Tolerance: Patient limited by fatigue;Patient limited  by pain Patient left: in bed;with call bell/phone within reach;with nursing/sitter in room  GP     Anajah Sterbenz,KATHrine E 12/23/2012, 3:50 PM Zenovia Jarred, PT, DPT 12/23/2012 Pager: [phone]

## 2012-12-23 NOTE — Evaluation (Signed)
Occupational Therapy Evaluation Patient Details Name: [name] MRN: [id number] DOB: 1930/08/11 Today's Date: 12/23/2012 Time: 4098-1191 OT Time Calculation (min): 17 min  OT Assessment / Plan / Recommendation History of present illness 79 y.o. BM PMHx sepsis, hypotension, legally blind secondary to glaucoma, DM, DM neuropathy, gout HLD, bilateral feet cellulitis. Patient  sent to the ED after suffering a syncopal episode in his home at around 8:30 pm that was witnessed by his wife   Clinical Impression   Pt admitted with above. Pt currently with functional limitations due to the deficits listed below (see OT Problem List). Pt also with Bil feet wounds. Pt will benefit from skilled OT to increase their safety and independence with ADL and functional mobility for ADL to facilitate discharge to venue listed below.       OT Assessment  Patient needs continued OT Services    Follow Up Recommendations  SNF       Equipment Recommendations   (TBD at next venue)       Frequency  Min 2X/week    Precautions / Restrictions Precautions Precautions: Fall Precaution Comments: legally blind--can see outlines, but not details Restrictions Weight Bearing Restrictions: No   Pertinent Vitals/Pain Bil behind knees with any movement--dopplers were -    ADL  ADL Comments: At this time pt is total A for all BADLs due to not really wanting to particpate    OT Diagnosis: Generalized weakness;Acute pain  OT Problem List: Decreased strength;Decreased activity tolerance;Pain OT Treatment Interventions: Self-care/ADL training;Balance training;Patient/family education;DME and/or AE instruction;Therapeutic activities;Therapeutic exercise   OT Goals(Current goals can be found in the care plan section) Acute Rehab OT Goals OT Goal Formulation: With patient Time For Goal Achievement: 01/06/13 Potential to Achieve Goals: Good ADL Goals Pt/caregiver will Perform Home Exercise Program: For  increased strengthening;With Supervision, verbal cues required/provided  Visit Information  Last OT Received On: 12/23/12 Assistance Needed: +2 PT/OT Co-Evaluation/Treatment: Yes History of Present Illness: 79 y.o. BM PMHx sepsis, hypotension, legally blind secondary to glaucoma, DM, DM neuropathy, gout HLD, bilateral feet cellulitis. Patient  sent to the ED after suffering a syncopal episode in his home at around 8:30 pm that was witnessed by his wife       Prior Functioning     Home Living Family/patient expects to be discharged to:: Private residence Living Arrangements: Spouse/significant other Type of Home: House Home Access: Stairs to enter [employer] of Steps: 7-8 Entrance Stairs-Rails: Right Home Layout: One level Home Equipment: Walker - 4 wheels;Wheelchair - manual (rollator) Prior Function Level of Independence: Independent with assistive device(s) Communication Communication: No difficulties Dominant Hand: Right         Vision/Perception Vision - History Baseline Vision: No visual deficits Patient Visual Report: No change from baseline   Cognition  Cognition Arousal/Alertness: Awake/alert Overall Cognitive Status: No family/caregiver present to determine baseline cognitive functioning Area of Impairment: Orientation Orientation Level: Place;Time;Situation;Disoriented to    Extremity/Trunk Assessment Upper Extremity Assessment Upper Extremity Assessment: generalized weakness Lower Extremity Assessment Lower Extremity Assessment: Generalized weakness;LLE deficits/detail;RLE deficits/detail RLE Deficits / Details:  assist with LEs for bed mobility RLE: Unable to fully assess due to pain LLE Deficits / Details: pain behind knee with extension, assist with LEs for bed mobility LLE: Unable to fully assess due to pain     Mobility Bed Mobility Bed Mobility: Rolling Left;Rolling Right;Scooting to HOB Rolling Right: 1: +2 Total assist Rolling  Right: Patient Percentage: 0% Rolling Left: 1: +2 Total assist Rolling Left: Patient  Percentage: 0% Scooting to HOB: 1: +2 Total assist Scooting to [facility]: Patient Percentage: 0% Details for Bed Mobility Assistance: pt given verbal and tactile cues however reports "just wants to sleep" and states bed mobility painful in LEs so required +2 assist           End of Session OT - End of Session Equipment Utilized During Treatment: Gait belt;Rolling walker Activity Tolerance: Patient limited by lethargy;Patient limited by fatigue Patient left: in bed;with nursing/sitter in room       [name] [phone] 12/23/2012, 4:02 PM

## 2012-12-23 NOTE — Progress Notes (Signed)
VASCULAR LAB PRELIMINARY  PRELIMINARY  PRELIMINARY  PRELIMINARY  Bilateral lower extremity venous duplex completed.    Preliminary report:  Bilateral:  No evidence of DVT, superficial thrombosis, or Baker's Cyst.   [name], RVS 12/23/2012, 1:17 PM

## 2012-12-23 NOTE — Progress Notes (Signed)
Pt potassium 3.4 this PM. Dr Joseph Art notified and ordered to still give 2 runs of potassium. Baron Hamper, RN 12/23/2012

## 2012-12-23 NOTE — Progress Notes (Signed)
[facility] PROGRESS NOTE  [name]:[id number] DOB: 1931/05/17 DOA: 12/20/2012 PCP: Berenda Morale, MD  Assessment/Plan: 1. Cellulitis; bilateral feet continue antibiotics 2. DM; continue to control with SSI,; hemoglobin A1c= 6.2 3. HLD obtain lipid panel 4. Sepsis see #1 5. DVT rule out; obtain bilateral lower extremity Dopplers 6.   Hypokalemia; will replete potassium today and obtain a.m. Labs 7   Gout; unsure if lag pain Gout but will obtain Gout labs if negative may want to drain Lt knee and send for CPPD testing  Code Status: Full  Consultants:  Procedures:  Bilateral lower extremity ultrasound 12/23/2012 Bilateral lower extremity venous duplex completed.  Preliminary report: Bilateral: No evidence of DVT, superficial thrombosis, or Baker's Cyst.    Antibiotics:  Maxipime, fluconazole, vancomycin day4  HPI/Subjective: [name] is a 79 y.o. BM PMHx sepsis, hypotension, legally blind secondary to glaucoma, DM, DM neuropathy, gout HLD, bilateral feet cellulitis. Patient  sent to the ED after suffering a syncopal episode in his home at around 8:30 pm that was witnessed by his wife. He "states that a wave came over him" and he blacked out. He denies having chest pain or headache or dizziness prior to the episode. He denies having any fevers or chills or nausea vomiting or diarrhea. He was evaluated in the ED and was found to have hypotension and on examination he was found to have erythema and wet skin breakdown of both feet along with hemorrhagic blisters on the sole of his Left foot. Laboratory studies revealed an elevated lactate level and he was placed on antibiotic coverage for Cellulitis and Sepsis. With IV Vancomycin and Cefepime and was referred for medical admission. NOTE after seeing patient in a.m. nurse paged me to patient's bedside patient was complaining of bilateral calf pain Lt> Rt and on palpation pain consistent with possible  DVT   Objective: Filed Vitals:   12/22/12 1506 12/22/12 2100 12/23/12 0543 12/23/12 1429  BP: 124/69 135/77 132/83 128/76  Pulse: 99 96 98 101  Temp: 99.1 F (37.3 C) 98.6 F (37 C) 99.1 F (37.3 C) 98.9 F (37.2 C)  TempSrc: Oral Oral Oral Oral  Resp: 20 18 19 18   Height:      Weight:   95.3 kg (210 lb 1.6 oz)   SpO2: 99% 99% 99% 96%    Intake/Output Summary (Last 24 hours) at 12/23/12 1543 Last data filed at 12/23/12 1300  Gross per 24 hour  Intake   1240 ml  Output    700 ml  Net    540 ml   Filed Weights   12/21/12 0259 12/22/12 0620 12/23/12 0543  Weight: 96.435 kg (212 lb 9.6 oz) 95.6 kg (210 lb 12.2 oz) 95.3 kg (210 lb 1.6 oz)    Exam:   General:  Alert,NAD  Cardiovascular: Regular rhythm and rate, negative murmurs rubs gallops, DP/PT pulse one plus bilateral  Respiratory: Good auscultation bilaterally  Abdomen: Obese, soft, nontender, plus bowel sounds  Musculoskeletal: Bilateral foot cellulitis Rt > Lt. Erythema, skin breakdown between all toes, on the plantar surface of right foot multiple areas of purplish colored skin NOTE later in day patient complained of bilateral lower extremity pain in the calf sensitive to palpation    Data Reviewed: Basic Metabolic Panel:  Recent Labs Lab 12/20/12 2324 12/21/12 0410  NA 137 139  K 3.9 3.0*  CL 101 103  CO2 25 26  GLUCOSE 145* 105*  BUN 21 23  CREATININE 1.69* 1.66*  CALCIUM 8.3*  8.4   Liver Function Tests:  Recent Labs Lab 12/20/12 2324  AST 10  ALT <5  ALKPHOS 69  BILITOT 0.5  PROT 6.3  ALBUMIN 3.1*   No results found for this basename: LIPASE, AMYLASE,  in the last 168 hours No results found for this basename: AMMONIA,  in the last 168 hours CBC:  Recent Labs Lab 12/20/12 2324 12/21/12 0410  WBC 7.8 6.9  HGB 12.2* 11.3*  HCT 37.7* 34.2*  MCV 88.7 87.9  PLT 153 173   Cardiac Enzymes:  Recent Labs Lab 12/20/12 2323  TROPONINI <0.30   BNP (last 3 results) No results  found for this basename: PROBNP,  in the last 8760 hours CBG:  Recent Labs Lab 12/22/12 1048 12/22/12 1558 12/22/12 2119 12/23/12 0557 12/23/12 1131  GLUCAP 128* 112* 117* 103* 149*    Recent Results (from the past 240 hour(s))  CULTURE, BLOOD (ROUTINE X 2)     Status: None   Collection Time    12/20/12 11:10 PM      Result Value Range Status   Specimen Description BLOOD RIGHT ARM   Final   Special Requests BOTTLES DRAWN AEROBIC AND ANAEROBIC 10CC EA   Final   Culture  Setup Time 12/21/2012 15:06   Final   Culture     Final   Value:        BLOOD CULTURE RECEIVED NO GROWTH TO DATE CULTURE WILL BE HELD FOR 5 DAYS BEFORE ISSUING A FINAL NEGATIVE REPORT   Report Status PENDING   Incomplete  CULTURE, BLOOD (ROUTINE X 2)     Status: None   Collection Time    12/20/12 11:28 PM      Result Value Range Status   Specimen Description BLOOD RIGHT HAND   Final   Special Requests BOTTLES DRAWN AEROBIC ONLY 10CC   Final   Culture  Setup Time 12/21/2012 15:07   Final   Culture     Final   Value:        BLOOD CULTURE RECEIVED NO GROWTH TO DATE CULTURE WILL BE HELD FOR 5 DAYS BEFORE ISSUING A FINAL NEGATIVE REPORT   Report Status PENDING   Incomplete  URINE CULTURE     Status: None   Collection Time    12/21/12  1:20 AM      Result Value Range Status   Specimen Description URINE, RANDOM   Final   Special Requests NONE   Final   Culture  Setup Time 12/21/2012 01:49   Final   Colony Count NO GROWTH   Final   Culture NO GROWTH   Final   Report Status 12/22/2012 FINAL   Final     Studies: No results found.  Scheduled Meds: . brinzolamide  1 drop Right Eye TID   And  . brimonidine  1 drop Right Eye TID  . ceFEPime (MAXIPIME) IV  1 g Intravenous Q12H  . cyclopentolate  1 drop Both Eyes TID  . dorzolamide  1 drop Right Eye TID  . enoxaparin (LOVENOX) injection  40 mg Subcutaneous Q24H  . fluconazole  150 mg Oral Daily  . insulin aspart  0-5 Units Subcutaneous QHS  . insulin aspart   0-9 Units Subcutaneous TID WC  . polyvinyl alcohol  1 drop Left Eye TID  . potassium chloride  10 mEq Oral Daily  . vancomycin  1,500 mg Intravenous Q24H   Continuous Infusions:   Principal Problem:   Sepsis Active Problems:   Cellulitis  Hypotension   Syncope   Diabetes mellitus   Hyperlipidemia   Morbid obesity    Time spent: 30 minutes    Dutch Ing, J  [facility] Pager [phone]. If 7PM-7AM, please contact night-coverage at [email or web address], password [facility] 12/23/2012, 3:43 PM  LOS: 3 days

## 2012-12-23 NOTE — Progress Notes (Signed)
Bilateral foot dressings completed.  Both feet, including b/t toes, cleansed with normal saline, then patted dry.  aquacel 1/2 strips woven b/t toes, 4x4 gauze to top and bottom of feet to secure, then both feet wrapped with kerlix.  Description of wounds:  Right foot with red discolored areas to bottom of foot - full width of foot just beneath toes and asymmetrical pattern down to middle of foot.  Wiped skin with wet cloth and confirmed that discoloration is beneath skin.  Dark area on bottom of foot = 1x1cm.  Left foot with slight darkened discoloration on upper pad of bottom of foot. No drainage in any area.  Skin between toes with crusty areas.  Cleansed, dried,  aquacel applied and dry gauze.

## 2012-12-23 NOTE — Progress Notes (Signed)
Pt's venous scan was negative for DVT.  Dr. Joseph Art ordered Uric Acid level for possibility of gout in left leg.  Dressings changed to Bilateral feet per orders

## 2012-12-24 ENCOUNTER — Inpatient Hospital Stay (HOSPITAL_COMMUNITY): Payer: Medicare Other

## 2012-12-24 DIAGNOSIS — R509 Fever, unspecified: Secondary | ICD-10-CM

## 2012-12-24 DIAGNOSIS — L0291 Cutaneous abscess, unspecified: Secondary | ICD-10-CM

## 2012-12-24 DIAGNOSIS — R0989 Other specified symptoms and signs involving the circulatory and respiratory systems: Secondary | ICD-10-CM

## 2012-12-24 DIAGNOSIS — L0390 Cellulitis, unspecified: Secondary | ICD-10-CM

## 2012-12-24 DIAGNOSIS — R55 Syncope and collapse: Secondary | ICD-10-CM

## 2012-12-24 LAB — COMPREHENSIVE METABOLIC PANEL
ALT: <5 U/L (ref 0–53)
AST: 10 U/L (ref 0–37)
Albumin: 2.8 g/dL — ABNORMAL LOW (ref 3.5–5.2)
Alkaline Phosphatase: 63 U/L (ref 39–117)
BUN: 12 mg/dL (ref 6–23)
CO2: 28 mEq/L (ref 19–32)
Calcium: 9.2 mg/dL (ref 8.4–10.5)
Chloride: 101 mEq/L (ref 96–112)
Creatinine, Ser: 0.92 mg/dL (ref 0.50–1.35)
GFR calc Af Amer: >90 mL/min (ref >90)
GFR calc non Af Amer: 78 mL/min — ABNORMAL LOW (ref >90)
Glucose, Bld: 142 mg/dL — ABNORMAL HIGH (ref 70–99)
Potassium: 3.1 mEq/L — ABNORMAL LOW (ref 3.5–5.1)
Sodium: 140 mEq/L (ref 135–145)
Total Bilirubin: 0.6 mg/dL (ref 0.3–1.2)
Total Protein: 6.9 g/dL (ref 6.0–8.3)

## 2012-12-24 LAB — VANCOMYCIN, TROUGH: Vancomycin Tr: 10 ug/mL (ref 10.0–20.0)

## 2012-12-24 LAB — GLUCOSE, CAPILLARY
Glucose-Capillary: 129 mg/dL — ABNORMAL HIGH (ref 70–99)
Glucose-Capillary: 164 mg/dL — ABNORMAL HIGH (ref 70–99)
Glucose-Capillary: 131 mg/dL — ABNORMAL HIGH (ref 70–99)
Glucose-Capillary: 181 mg/dL — ABNORMAL HIGH (ref 70–99)

## 2012-12-24 LAB — MAGNESIUM: Magnesium: 1.7 mg/dL (ref 1.5–2.5)

## 2012-12-24 MED ORDER — POTASSIUM CHLORIDE CRYS ER 20 MEQ PO TBCR
20.0000 meq | EXTENDED_RELEASE_TABLET | Freq: Once | ORAL | Status: AC
  Administered 2012-12-24: 20 meq via ORAL

## 2012-12-24 MED ORDER — POTASSIUM CHLORIDE CRYS ER 20 MEQ PO TBCR
40.0000 meq | EXTENDED_RELEASE_TABLET | Freq: Once | ORAL | Status: AC
  Administered 2012-12-24: 40 meq via ORAL
  Filled 2012-12-24: qty 2

## 2012-12-24 MED ORDER — POTASSIUM CHLORIDE 20 MEQ/15ML (10%) PO LIQD
40.0000 meq | Freq: Once | ORAL | Status: AC
  Administered 2012-12-24: 40 meq via ORAL
  Filled 2012-12-24 (×2): qty 30

## 2012-12-24 MED ORDER — POTASSIUM CHLORIDE IN NACL 20-0.9 MEQ/L-% IV SOLN
INTRAVENOUS | Status: DC
  Administered 2012-12-24 (×2): via INTRAVENOUS
  Administered 2012-12-25: 1000 mL via INTRAVENOUS
  Administered 2012-12-25: 20:00:00 via INTRAVENOUS
  Administered 2012-12-26: 1 mL via INTRAVENOUS
  Administered 2012-12-27: 05:00:00 via INTRAVENOUS
  Filled 2012-12-24 (×8): qty 1000

## 2012-12-24 NOTE — Progress Notes (Signed)
Potassium 3.1 this AM and temp 101.3 tylenol given and K. Schorr paged. Ordered PO potassium replacement and will continue to monitor temperature. Baron Hamper, RN 12/24/2012

## 2012-12-24 NOTE — Clinical Documentation Improvement (Signed)
THIS DOCUMENT IS NOT A PERMANENT PART OF THE MEDICAL RECORD  Please update your documentation with the medical record to reflect your response to this query. If you need help knowing how to do this please call [phone].  12/24/12  Dr. Susie Cassette,  In a better effort to capture your patient's severity of illness, reflect appropriate length of stay and utilization of resources, a review of the patient medical record has revealed the following indicators:  BUN/Cr/GFR    (black male)  this admission 21/1.69/43 23/1.66/44 12/0.92/>90   BUN/Cr/GFR     (black male)    09/26/11 24/1.15/68  Known history of Hypertension and Diabetes  Hypotensive on admission requiring IV fluids with systolic BP's in the 90's to 100's.  Syncope prompting presentation to ED   Based on your clinical judgment, please document in the progress notes and discharge summary if a condition below provides greater specificity regarding the patient's renal function:   - Acute Kidney Injury, resolved 2/2 ____________________   - Other Condition   - Unable to Clinically Determine   In responding to this query please exercise your independent judgment.    The fact that a query is asked, does not imply that any particular answer is desired or expected.   Reviewed: 12/30/12 - Dr. Susie Cassette documented "AKI" in dc summary.  Mathis Dad RN  Thank You,  Jerral Ralph  RN BSN CCDS Certified Clinical Documentation Specialist [phone] Health Information Management [place]

## 2012-12-24 NOTE — Progress Notes (Signed)
ANTIBIOTIC CONSULT NOTE - Follow Up  Pharmacy Consult for Vancocin Indication: rule out sepsis and cellulitis  No Known Allergies  Patient Measurements: Weight: 94.1 kg  Vital Signs: Temp: 98.1 F (36.7 C) (07/23 0653) Temp src: Oral (07/23 0527) BP: 134/83 mmHg (07/23 0527) Pulse Rate: 105 (07/23 0527)  Labs:  Recent Labs  12/24/12 0440  CREATININE 0.92    Microbiology: Recent Results (from the past 720 hour(s))  CULTURE, BLOOD (ROUTINE X 2)     Status: None   Collection Time    12/20/12 11:10 PM      Result Value Range Status   Specimen Description BLOOD RIGHT ARM   Final   Special Requests BOTTLES DRAWN AEROBIC AND ANAEROBIC 10CC EA   Final   Culture  Setup Time 12/21/2012 15:06   Final   Culture     Final   Value:        BLOOD CULTURE RECEIVED NO GROWTH TO DATE CULTURE WILL BE HELD FOR 5 DAYS BEFORE ISSUING A FINAL NEGATIVE REPORT   Report Status PENDING   Incomplete  CULTURE, BLOOD (ROUTINE X 2)     Status: None   Collection Time    12/20/12 11:28 PM      Result Value Range Status   Specimen Description BLOOD RIGHT HAND   Final   Special Requests BOTTLES DRAWN AEROBIC ONLY 10CC   Final   Culture  Setup Time 12/21/2012 15:07   Final   Culture     Final   Value:        BLOOD CULTURE RECEIVED NO GROWTH TO DATE CULTURE WILL BE HELD FOR 5 DAYS BEFORE ISSUING A FINAL NEGATIVE REPORT   Report Status PENDING   Incomplete  URINE CULTURE     Status: None   Collection Time    12/21/12  1:20 AM      Result Value Range Status   Specimen Description URINE, RANDOM   Final   Special Requests NONE   Final   Culture  Setup Time 12/21/2012 01:49   Final   Colony Count NO GROWTH   Final   Culture NO GROWTH   Final   Report Status 12/22/2012 FINAL   Final    Medical History: Past Medical History  Diagnosis Date  . Gout     Right foot and leg  . Shortness of breath     with exersertion  . Sleep apnea   . Diabetes mellitus   . Hypertension   . Hyperlipidemia   .  Gastric ulcer     50 Years ago  . Arthritis     All Joints  . Insomnia   . Glaucoma   . Morbid obesity     Assessment: 79yo male was in normal state of health when family witnessed him "pass out" and become unresponsive, EMS found BP down to 50s, now A&Ox4 w/ BP to 97, to begin IV ABX for possible sepsis and cellulitis. Vanc trough 10.0 this am  Goal of Therapy:  Vanc trough 10-15 mg/L  Plan:  Continue vancomycin 1500mg  IV Q24H and monitor renal function and clinical course.  Talbert Cage, PharmD 12/24/2012,12:48 PM

## 2012-12-24 NOTE — Progress Notes (Signed)
VASCULAR LAB PRELIMINARY  ARTERIAL  ABI completed:    RIGHT    LEFT    PRESSURE WAVEFORM  PRESSURE WAVEFORM  BRACHIAL 153 Triphasic BRACHIAL 151 Triphasic  AT 111 Biphasic AT 133 Biphasic  PT 126 Triphasic PT 142 Biphasic    RIGHT LEFT  ABI 0.82 0.93   ABI on the right indicate a mild reduction in arterial flow. The left ABI indicates normal arterial flow  [name], RVS 12/24/2012, 3:43 PM

## 2012-12-24 NOTE — Progress Notes (Signed)
  Echocardiogram 2D Echocardiogram has been performed.  Arvil Chaco 12/24/2012, 4:22 PM

## 2012-12-24 NOTE — Progress Notes (Signed)
[facility] PROGRESS NOTE  [name] ZOX:[id number] DOB: [birth year] DOA: 12/20/2012 PCP: Berenda Morale, MD  Assessment/Plan: Principal Problem:   Sepsis Active Problems:   Cellulitis   Hypotension   Syncope   Diabetes mellitus   Hyperlipidemia   Morbid obesity   Hypokalemia   Gout    Assessment/Plan:  1. Cellulitis; bilateral feet continue antibiotics, obtain ABI to rule out peripheral vascular disease, febrile last night, will repeat blood culture, 2-D echo will be done to rule out endocarditis 2. DM; continue to control with SSI,; hemoglobin A1c= 6.2 3. HLD obtain lipid panel 4. Sepsis see #1, blood culture negative to date 5. DVT rule out; Doppler negative 6. Hypokalemia; will replete potassium today and obtain a.m. Labs  7 Gout; Uric acid within normal limits, does not rule out gout, 8. Disposition may need SNF, social work consultation placed   Code Status: Full  Consultants:  Procedures:  Bilateral lower extremity ultrasound 12/23/2012  Bilateral lower extremity venous duplex completed.  Preliminary report: Bilateral: No evidence of DVT, superficial thrombosis, or Baker's Cyst.  Antibiotics:  Maxipime, fluconazole, vancomycin day4 HPI/Subjective:  [name] is a 79 y.o. BM PMHx sepsis, hypotension, legally blind secondary to glaucoma, DM, DM neuropathy, gout HLD, bilateral feet cellulitis. Patient sent to the ED after suffering a syncopal episode in his home at around 8:30 pm that was witnessed by his wife. He "states that a wave came over him" and he blacked out. He denies having chest pain or headache or dizziness prior to the episode. He denies having any fevers or chills or nausea vomiting or diarrhea. He was evaluated in the ED and was found to have hypotension and on examination he was found to have erythema and wet skin breakdown of both feet along with hemorrhagic blisters on the sole of his Left foot. Laboratory studies revealed an elevated  lactate level and he was placed on antibiotic coverage for Cellulitis and Sepsis. With IV Vancomycin and Cefepime and was referred for medical admission. NOTE after seeing patient in a.m. nurse paged me to patient's bedside patient was complaining of bilateral calf pain Lt> Rt and on palpation pain consistent with possible DVT    febrile last night    Objective: Filed Vitals:   12/23/12 2142 12/24/12 0524 12/24/12 0527 12/24/12 0653  BP: 156/94  134/83   Pulse: 112  105   Temp: 98.2 F (36.8 C)  101.3 F (38.5 C) 98.1 F (36.7 C)  TempSrc: Oral  Oral   Resp: 18  18   Height:      Weight:  94.1 kg (207 lb 7.3 oz)    SpO2: 95%  96%     Intake/Output Summary (Last 24 hours) at 12/24/12 0851 Last data filed at 12/24/12 8119  Gross per 24 hour  Intake   1210 ml  Output    550 ml  Net    660 ml    Exam:  General: Alert,NAD Cardiovascular: Regular rhythm and rate, negative murmurs rubs gallops, DP/PT pulse one plus bilateral Respiratory: Good auscultation bilaterally Abdomen: Obese, soft, nontender, plus bowel sounds Musculoskeletal: Bilateral foot cellulitis Rt > Lt. Erythema, skin breakdown between all toes, on the plantar surface of right foot multiple areas of purplish colored skin NOTE later in day patient complained of bilateral lower extremity pain in the calf sensitive to palpation    Data Reviewed: Basic Metabolic Panel:  Recent Labs Lab 12/20/12 2324 12/21/12 0410 12/23/12 1932 12/24/12 0440  NA 137 139  --  140  K 3.9 3.0* 3.4* 3.1*  CL 101 103  --  101  CO2 25 26  --  28  GLUCOSE 145* 105*  --  142*  BUN 21 23  --  12  CREATININE 1.69* 1.66*  --  0.92  CALCIUM 8.3* 8.4  --  9.2  MG  --   --   --  1.7    Liver Function Tests:  Recent Labs Lab 12/20/12 2324 12/24/12 0440  AST 10 10  ALT <5 <5  ALKPHOS 69 63  BILITOT 0.5 0.6  PROT 6.3 6.9  ALBUMIN 3.1* 2.8*   No results found for this basename: LIPASE, AMYLASE,  in the last 168 hours No results  found for this basename: AMMONIA,  in the last 168 hours  CBC:  Recent Labs Lab 12/20/12 2324 12/21/12 0410  WBC 7.8 6.9  HGB 12.2* 11.3*  HCT 37.7* 34.2*  MCV 88.7 87.9  PLT 153 173    Cardiac Enzymes:  Recent Labs Lab 12/20/12 2323  TROPONINI <0.30   BNP (last 3 results) No results found for this basename: PROBNP,  in the last 8760 hours   CBG:  Recent Labs Lab 12/23/12 0557 12/23/12 1131 12/23/12 1635 12/23/12 2138 12/24/12 0553  GLUCAP 103* 149* 148* 125* 129*    Recent Results (from the past 240 hour(s))  CULTURE, BLOOD (ROUTINE X 2)     Status: None   Collection Time    12/20/12 11:10 PM      Result Value Range Status   Specimen Description BLOOD RIGHT ARM   Final   Special Requests BOTTLES DRAWN AEROBIC AND ANAEROBIC 10CC EA   Final   Culture  Setup Time 12/21/2012 15:06   Final   Culture     Final   Value:        BLOOD CULTURE RECEIVED NO GROWTH TO DATE CULTURE WILL BE HELD FOR 5 DAYS BEFORE ISSUING A FINAL NEGATIVE REPORT   Report Status PENDING   Incomplete  CULTURE, BLOOD (ROUTINE X 2)     Status: None   Collection Time    12/20/12 11:28 PM      Result Value Range Status   Specimen Description BLOOD RIGHT HAND   Final   Special Requests BOTTLES DRAWN AEROBIC ONLY 10CC   Final   Culture  Setup Time 12/21/2012 15:07   Final   Culture     Final   Value:        BLOOD CULTURE RECEIVED NO GROWTH TO DATE CULTURE WILL BE HELD FOR 5 DAYS BEFORE ISSUING A FINAL NEGATIVE REPORT   Report Status PENDING   Incomplete  URINE CULTURE     Status: None   Collection Time    12/21/12  1:20 AM      Result Value Range Status   Specimen Description URINE, RANDOM   Final   Special Requests NONE   Final   Culture  Setup Time 12/21/2012 01:49   Final   Colony Count NO GROWTH   Final   Culture NO GROWTH   Final   Report Status 12/22/2012 FINAL   Final     Studies: Dg Chest Portable 1 View  12/20/2012   *RADIOLOGY REPORT*  Clinical Data: Shortness of breath  with exertion; history of diabetes.  Sepsis.  PORTABLE CHEST - 1 VIEW  Comparison: Chest radiograph performed 03/24/2012, and CT of the chest performed 04/15/2012  Findings: The lungs are mildly hypoexpanded.  Mild vascular congestion is seen.  Mild bibasilar airspace opacities raise concern for minimal interstitial edema.  No pleural effusion or pneumothorax is seen.  The cardiomediastinal silhouette is within normal limits.  No acute osseous abnormalities are seen.  Thoracic spinal stimulation leads are partially imaged.  Degenerative change is noted at both glenohumeral joints.  IMPRESSION: Lungs mildly hypoexpanded.  Mild vascular congestion seen.  Mild bibasilar airspace opacities raise concern for minimal interstitial edema.   Original Report Authenticated By: Tonia Ghent, M.D.    Scheduled Meds: . brinzolamide  1 drop Right Eye TID   And  . brimonidine  1 drop Right Eye TID  . ceFEPime (MAXIPIME) IV  1 g Intravenous Q12H  . cyclopentolate  1 drop Both Eyes TID  . dorzolamide  1 drop Right Eye TID  . enoxaparin (LOVENOX) injection  40 mg Subcutaneous Q24H  . fluconazole  150 mg Oral Daily  . insulin aspart  0-5 Units Subcutaneous QHS  . insulin aspart  0-9 Units Subcutaneous TID WC  . polyvinyl alcohol  1 drop Left Eye TID  . potassium chloride  10 mEq Oral Daily  . potassium chloride  20 mEq Oral Once  . vancomycin  1,500 mg Intravenous Q24H   Continuous Infusions:   Principal Problem:   Sepsis Active Problems:   Cellulitis   Hypotension   Syncope   Diabetes mellitus   Hyperlipidemia   Morbid obesity   Hypokalemia   Gout    Time spent: 40 minutes   [facility]  [facility] Pager [phone]. If 8PM-8AM, please contact night-coverage at [email or web address], password [facility] 12/24/2012, 8:51 AM  LOS: 4 days

## 2012-12-25 ENCOUNTER — Inpatient Hospital Stay (HOSPITAL_COMMUNITY): Payer: Medicare Other

## 2012-12-25 LAB — CBC
HCT: 33.9 % — ABNORMAL LOW (ref 39.0–52.0)
Hemoglobin: 11.7 g/dL — ABNORMAL LOW (ref 13.0–17.0)
MCH: 29.7 pg (ref 26.0–34.0)
MCHC: 34.5 g/dL (ref 30.0–36.0)
MCV: 86 fL (ref 78.0–100.0)
Platelets: 201 10*3/uL (ref 150–400)
RBC: 3.94 MIL/uL — ABNORMAL LOW (ref 4.22–5.81)
RDW: 14.1 % (ref 11.5–15.5)
WBC: 8.3 10*3/uL (ref 4.0–10.5)

## 2012-12-25 LAB — GLUCOSE, CAPILLARY
Glucose-Capillary: 145 mg/dL — ABNORMAL HIGH (ref 70–99)
Glucose-Capillary: 149 mg/dL — ABNORMAL HIGH (ref 70–99)
Glucose-Capillary: 117 mg/dL — ABNORMAL HIGH (ref 70–99)
Glucose-Capillary: 129 mg/dL — ABNORMAL HIGH (ref 70–99)

## 2012-12-25 LAB — COMPREHENSIVE METABOLIC PANEL
ALT: 5 U/L (ref 0–53)
AST: 10 U/L (ref 0–37)
Albumin: 2.5 g/dL — ABNORMAL LOW (ref 3.5–5.2)
Alkaline Phosphatase: 58 U/L (ref 39–117)
BUN: 17 mg/dL (ref 6–23)
CO2: 24 mEq/L (ref 19–32)
Calcium: 9 mg/dL (ref 8.4–10.5)
Chloride: 104 mEq/L (ref 96–112)
Creatinine, Ser: 0.95 mg/dL (ref 0.50–1.35)
GFR calc Af Amer: 89 mL/min — ABNORMAL LOW (ref >90)
GFR calc non Af Amer: 77 mL/min — ABNORMAL LOW (ref >90)
Glucose, Bld: 134 mg/dL — ABNORMAL HIGH (ref 70–99)
Potassium: 3.9 mEq/L (ref 3.5–5.1)
Sodium: 139 mEq/L (ref 135–145)
Total Bilirubin: 0.4 mg/dL (ref 0.3–1.2)
Total Protein: 6.3 g/dL (ref 6.0–8.3)

## 2012-12-25 LAB — MAGNESIUM: Magnesium: 2 mg/dL (ref 1.5–2.5)

## 2012-12-25 NOTE — Progress Notes (Signed)
[facility] PROGRESS NOTE  PRAVIN PEREZPEREZ ZOX:[id number] DOB: 04-Apr-1931 DOA: 12/20/2012 PCP: Berenda Morale, MD  Assessment/Plan: Principal Problem:   Sepsis Active Problems:   Cellulitis   Hypotension   Syncope   Diabetes mellitus   Hyperlipidemia   Morbid obesity   Hypokalemia   Gout    Assessment/Plan:  1. Cellulitis; bilateral feet , obtain MRI of both feet to rule out abscess, continue antibiotics, ABI on the right indicating a mild reduction in arterial flow, blood culture no growths of R., 2-D echo shows no vegetation 2. DM; continue to control with SSI,; hemoglobin A1c= 6.2 3. HLD obtain lipid panel 4. Sepsis see #1, blood culture negative to date 5. DVT rule out; Doppler negative 6. Hypokalemia; will replete potassium today and obtain a.m. Labs  7 Gout; Uric acid within normal limits, does not rule out gout,  8. Disposition may need SNF, social work consultation placed    Code Status: Full  Consultants:  Procedures:  Bilateral lower extremity ultrasound 12/23/2012  Bilateral lower extremity venous duplex completed.  Preliminary report: Bilateral: No evidence of DVT, superficial thrombosis, or Baker's Cyst.  Antibiotics:  Maxipime, fluconazole, vancomycin day4 HPI/Subjective:  [name] is a 79 y.o. BM PMHx sepsis, hypotension, legally blind secondary to glaucoma, DM, DM neuropathy, gout HLD, bilateral feet cellulitis. Patient sent to the ED after suffering a syncopal episode in his home at around 8:30 pm that was witnessed by his wife. He "states that a wave came over him" and he blacked out. He denies having chest pain or headache or dizziness prior to the episode. He denies having any fevers or chills or nausea vomiting or diarrhea. He was evaluated in the ED and was found to have hypotension and on examination he was found to have erythema and wet skin breakdown of both feet along with hemorrhagic blisters on the sole of his Left foot. Laboratory  studies revealed an elevated lactate level and he was placed on antibiotic coverage for Cellulitis and Sepsis. With IV Vancomycin and Cefepime and was referred for medical admission. NOTE after seeing patient in a.m. nurse paged me to patient's bedside patient was complaining of bilateral calf pain Lt> Rt and on palpation pain consistent with possible DVT    afebrile last night     Objective: Filed Vitals:   12/24/12 0653 12/24/12 1800 12/24/12 2112 12/25/12 0502  BP:  136/84 127/82 140/87  Pulse:  103 109 105  Temp: 98.1 F (36.7 C) 98.9 F (37.2 C) 99 F (37.2 C) 99.8 F (37.7 C)  TempSrc:  Oral Oral Oral  Resp:  18 18 18   Height:      Weight:    98.4 kg (216 lb 14.9 oz)  SpO2:  97% 96% 96%    Intake/Output Summary (Last 24 hours) at 12/25/12 1125 Last data filed at 12/25/12 1031  Gross per 24 hour  Intake    240 ml  Output    650 ml  Net   -410 ml    Exam:  HENT:  Head: Atraumatic.  Nose: Nose normal.  Mouth/Throat: Oropharynx is clear and moist.  Eyes: Conjunctivae are normal. Pupils are equal, round, and reactive to light. No scleral icterus.  Neck: Neck supple. No tracheal deviation present.  Cardiovascular: Normal rate, regular rhythm, normal heart sounds and intact distal pulses.  Pulmonary/Chest: Effort normal and breath sounds normal. No respiratory distress.  Abdominal: Soft. Normal appearance and bowel sounds are normal. She exhibits no distension. There is no tenderness.  Musculoskeletal: She exhibits no edema and no tenderness.  Neurological: She is alert. No cranial nerve deficit.    Data Reviewed: Basic Metabolic Panel:  Recent Labs Lab 12/20/12 2324 12/21/12 0410 12/23/12 1932 12/24/12 0440 12/25/12 0435  NA 137 139  --  140 139  K 3.9 3.0* 3.4* 3.1* 3.9  CL 101 103  --  101 104  CO2 25 26  --  28 24  GLUCOSE 145* 105*  --  142* 134*  BUN 21 23  --  12 17  CREATININE 1.69* 1.66*  --  0.92 0.95  CALCIUM 8.3* 8.4  --  9.2 9.0  MG  --    --   --  1.7 2.0    Liver Function Tests:  Recent Labs Lab 12/20/12 2324 12/24/12 0440 12/25/12 0435  AST 10 10 10   ALT <5 <5 5  ALKPHOS 69 63 58  BILITOT 0.5 0.6 0.4  PROT 6.3 6.9 6.3  ALBUMIN 3.1* 2.8* 2.5*   No results found for this basename: LIPASE, AMYLASE,  in the last 168 hours No results found for this basename: AMMONIA,  in the last 168 hours  CBC:  Recent Labs Lab 12/20/12 2324 12/21/12 0410 12/25/12 0435  WBC 7.8 6.9 8.3  HGB 12.2* 11.3* 11.7*  HCT 37.7* 34.2* 33.9*  MCV 88.7 87.9 86.0  PLT 153 173 201    Cardiac Enzymes:  Recent Labs Lab 12/20/12 2323  TROPONINI <0.30   BNP (last 3 results) No results found for this basename: PROBNP,  in the last 8760 hours   CBG:  Recent Labs Lab 12/24/12 0553 12/24/12 1114 12/24/12 1716 12/24/12 2110 12/25/12 0642  GLUCAP 129* 164* 131* 181* 145*    Recent Results (from the past 240 hour(s))  CULTURE, BLOOD (ROUTINE X 2)     Status: None   Collection Time    12/20/12 11:10 PM      Result Value Range Status   Specimen Description BLOOD RIGHT ARM   Final   Special Requests BOTTLES DRAWN AEROBIC AND ANAEROBIC 10CC EA   Final   Culture  Setup Time 12/21/2012 15:06   Final   Culture     Final   Value:        BLOOD CULTURE RECEIVED NO GROWTH TO DATE CULTURE WILL BE HELD FOR 5 DAYS BEFORE ISSUING A FINAL NEGATIVE REPORT   Report Status PENDING   Incomplete  CULTURE, BLOOD (ROUTINE X 2)     Status: None   Collection Time    12/20/12 11:28 PM      Result Value Range Status   Specimen Description BLOOD RIGHT HAND   Final   Special Requests BOTTLES DRAWN AEROBIC ONLY 10CC   Final   Culture  Setup Time 12/21/2012 15:07   Final   Culture     Final   Value:        BLOOD CULTURE RECEIVED NO GROWTH TO DATE CULTURE WILL BE HELD FOR 5 DAYS BEFORE ISSUING A FINAL NEGATIVE REPORT   Report Status PENDING   Incomplete  URINE CULTURE     Status: None   Collection Time    12/21/12  1:20 AM      Result Value  Range Status   Specimen Description URINE, RANDOM   Final   Special Requests NONE   Final   Culture  Setup Time 12/21/2012 01:49   Final   Colony Count NO GROWTH   Final   Culture NO GROWTH   Final  Report Status 12/22/2012 FINAL   Final  CULTURE, BLOOD (ROUTINE X 2)     Status: None   Collection Time    12/24/12  9:35 AM      Result Value Range Status   Specimen Description BLOOD LEFT HAND   Final   Special Requests     Final   Value: BOTTLES DRAWN AEROBIC AND ANAEROBIC AERO 10CC ANA 8CC   Culture  Setup Time 12/24/2012 17:23   Final   Culture     Final   Value:        BLOOD CULTURE RECEIVED NO GROWTH TO DATE CULTURE WILL BE HELD FOR 5 DAYS BEFORE ISSUING A FINAL NEGATIVE REPORT   Report Status PENDING   Incomplete  CULTURE, BLOOD (ROUTINE X 2)     Status: None   Collection Time    12/24/12  9:45 AM      Result Value Range Status   Specimen Description BLOOD RIGHT HAND   Final   Special Requests BOTTLES DRAWN AEROBIC ONLY 10CC   Final   Culture  Setup Time 12/24/2012 17:22   Final   Culture     Final   Value:        BLOOD CULTURE RECEIVED NO GROWTH TO DATE CULTURE WILL BE HELD FOR 5 DAYS BEFORE ISSUING A FINAL NEGATIVE REPORT   Report Status PENDING   Incomplete     Studies: Dg Chest 2 View  12/24/2012   *RADIOLOGY REPORT*  Clinical Data: Fever.  CHEST - 2 VIEW  Comparison: 12/20/2012.  Findings: Elevated right hemidiaphragm.  On the lateral view, increased markings right lung base possibly representing infiltrate versus crowding of lung markings.  Pulmonary vascular congestion most notable centrally.  Tortuous aorta.  Heart size within normal limits.  Spinal stimulating device in place T7-T8 level.  Shoulder joint degenerative changes.  IMPRESSION: Elevated right hemidiaphragm.  On the lateral view, increased markings right lung base possibly representing infiltrate versus crowding of lung markings.  Pulmonary vascular congestion most notable centrally.  Tortuous aorta.   Original  Report Authenticated By: Lacy Duverney, M.D.   Dg Chest Portable 1 View  12/20/2012   *RADIOLOGY REPORT*  Clinical Data: Shortness of breath with exertion; history of diabetes.  Sepsis.  PORTABLE CHEST - 1 VIEW  Comparison: Chest radiograph performed 03/24/2012, and CT of the chest performed 04/15/2012  Findings: The lungs are mildly hypoexpanded.  Mild vascular congestion is seen.  Mild bibasilar airspace opacities raise concern for minimal interstitial edema.  No pleural effusion or pneumothorax is seen.  The cardiomediastinal silhouette is within normal limits.  No acute osseous abnormalities are seen.  Thoracic spinal stimulation leads are partially imaged.  Degenerative change is noted at both glenohumeral joints.  IMPRESSION: Lungs mildly hypoexpanded.  Mild vascular congestion seen.  Mild bibasilar airspace opacities raise concern for minimal interstitial edema.   Original Report Authenticated By: Tonia Ghent, M.D.    Scheduled Meds: . brinzolamide  1 drop Right Eye TID   And  . brimonidine  1 drop Right Eye TID  . ceFEPime (MAXIPIME) IV  1 g Intravenous Q12H  . cyclopentolate  1 drop Both Eyes TID  . dorzolamide  1 drop Right Eye TID  . enoxaparin (LOVENOX) injection  40 mg Subcutaneous Q24H  . insulin aspart  0-5 Units Subcutaneous QHS  . insulin aspart  0-9 Units Subcutaneous TID WC  . polyvinyl alcohol  1 drop Left Eye TID  . potassium chloride  10 mEq Oral Daily  .  vancomycin  1,500 mg Intravenous Q24H   Continuous Infusions: . 0.9 % NaCl with KCl 20 mEq / L 1,000 mL (12/25/12 1059)    Principal Problem:   Sepsis Active Problems:   Cellulitis   Hypotension   Syncope   Diabetes mellitus   Hyperlipidemia   Morbid obesity   Hypokalemia   Gout    Time spent: 40 minutes   [facility]  [facility] Pager [phone]. If 8PM-8AM, please contact night-coverage at [email or web address], password [facility] 12/25/2012, 11:25 AM  LOS: 5 days

## 2012-12-25 NOTE — Clinical Social Work Placement (Signed)
Clinical Social Work Department CLINICAL SOCIAL WORK PLACEMENT NOTE 12/25/2012  Patient:  [name], [name]  Account Number:  [id number] Admit date:  12/20/2012  Clinical Social Worker:  Irving Burton [place], LCSWA  Date/time:  12/25/2012 02:32 PM  Clinical Social Work is seeking post-discharge placement for this patient at the following level of care:   SKILLED NURSING   (*CSW will update this form in Epic as items are completed)   12/25/2012  Patient/family provided with [facility] Department of Clinical Social Works list of facilities offering this level of care within the geographic area requested by the patient (or if unable, by the patients family).  12/25/2012  Patient/family informed of their freedom to choose among providers that offer the needed level of care, that participate in Medicare, Medicaid or managed care program needed by the patient, have an available bed and are willing to accept the patient.  12/25/2012  Patient/family informed of [facility] ownership interest in [facility], as well as of the fact that they are under no obligation to receive care at this facility.  PASARR submitted to EDS on 12/25/2012 PASARR number received from EDS on 12/25/2012  FL2 transmitted to all facilities in geographic area requested by pt/family on  12/25/2012 FL2 transmitted to all facilities within larger geographic area on   Patient informed that his/her managed care company has contracts with or will negotiate with  certain facilities, including the following:     Patient/family informed of bed offers received:   Patient chooses bed at  Physician recommends and patient chooses bed at    Patient to be transferred to  on   Patient to be transferred to facility by   The following physician request were entered in Epic:   Additional Comments:  Darlyn Chamber, MSW, LCSWA Clinical Social Work [phone]

## 2012-12-25 NOTE — Clinical Social Work Psychosocial (Signed)
Clinical Social Work Department BRIEF PSYCHOSOCIAL ASSESSMENT 12/25/2012  Patient:  Ricky, Duffy     Account Number:  [id number]     Admit date:  12/20/2012  Clinical Social Worker:  Sherre Lain  Date/Time:  12/25/2012 02:23 PM  Referred by:  Physician  Date Referred:  12/25/2012 Referred for  SNF Placement   Other Referral:   none.   Interview type:  Patient Other interview type:   none.    PSYCHOSOCIAL DATA Living Status:  WIFE Admitted from facility:   Level of care:   Primary support name:  Rhea Thrun Primary support relationship to patient:  SPOUSE Degree of support available:   Pt noted that his wife, Steward Drone, is supportive.    CURRENT CONCERNS Current Concerns  None Noted   Other Concerns:   none.    SOCIAL WORK ASSESSMENT / PLAN CSW met with pt at bedside. CSW explained CSW role in placement process and provided support to pt. Pt stated that prior to admission pt was living at home with his wife, [name] [phone]). Pt noted that he has never utilized a SNF before and he did not have a preference. CSW explained placement process to pt. Pt gave verbal consent and understanding for CSW to fax out his information to SNF in [facility]. Pt expressed that he would like for CSW to contact his wife and let her know about the placement process. CSW attempted to contact pt's wife via phone. CSW will attempt to contact wife at a later time. CSW to follow up with pt and his wife regarding placement and assist with other social work needs as they arise.   Assessment/plan status:  No Further Intervention Required Other assessment/ plan:   none.   Information/referral to community resources:   none.    PATIENTS/FAMILYS RESPONSE TO PLAN OF CARE: Pt was agreeable to SNF placement. CSW unable to contact pt's wife, but wil attempt at a later time.     Darlyn Chamber, MSW, LCSWA Clinical Social Work [phone]

## 2012-12-25 NOTE — Progress Notes (Signed)
Utilization Review Completed.   Eneida Evers, RN, BSN Nurse Case Manager  [phone]  

## 2012-12-25 NOTE — Progress Notes (Signed)
PT Cancellation Note  Patient Details Name: [name] MRN: [id number] DOB: 09/09/1930   Cancelled Treatment:    Reason Eval/Treat Not Completed: Patient declined, no reason specified.  Pt reported "Not now I just want to sleep.  Come back tomorrow.  I'm hurting really bad."  Pt reported 10/10 left sided pain.     Tyreese Thain 12/25/2012, 12:08 PM  Jake Shark, PT DPT [phone]

## 2012-12-26 DIAGNOSIS — M79609 Pain in unspecified limb: Secondary | ICD-10-CM

## 2012-12-26 DIAGNOSIS — M7989 Other specified soft tissue disorders: Secondary | ICD-10-CM

## 2012-12-26 LAB — COMPREHENSIVE METABOLIC PANEL
ALT: 13 U/L (ref 0–53)
AST: 19 U/L (ref 0–37)
Albumin: 2.2 g/dL — ABNORMAL LOW (ref 3.5–5.2)
Alkaline Phosphatase: 58 U/L (ref 39–117)
BUN: 16 mg/dL (ref 6–23)
CO2: 24 mEq/L (ref 19–32)
Calcium: 9 mg/dL (ref 8.4–10.5)
Chloride: 103 mEq/L (ref 96–112)
Creatinine, Ser: 0.93 mg/dL (ref 0.50–1.35)
GFR calc Af Amer: >90 mL/min (ref >90)
GFR calc non Af Amer: 78 mL/min — ABNORMAL LOW (ref >90)
Glucose, Bld: 119 mg/dL — ABNORMAL HIGH (ref 70–99)
Potassium: 3.9 mEq/L (ref 3.5–5.1)
Sodium: 139 mEq/L (ref 135–145)
Total Bilirubin: 0.4 mg/dL (ref 0.3–1.2)
Total Protein: 6.4 g/dL (ref 6.0–8.3)

## 2012-12-26 LAB — GLUCOSE, CAPILLARY
Glucose-Capillary: 123 mg/dL — ABNORMAL HIGH (ref 70–99)
Glucose-Capillary: 135 mg/dL — ABNORMAL HIGH (ref 70–99)
Glucose-Capillary: 112 mg/dL — ABNORMAL HIGH (ref 70–99)
Glucose-Capillary: 115 mg/dL — ABNORMAL HIGH (ref 70–99)

## 2012-12-26 LAB — MAGNESIUM: Magnesium: 2.2 mg/dL (ref 1.5–2.5)

## 2012-12-26 NOTE — Progress Notes (Signed)
PT Cancellation Note  _X_Treatment cancelled today due to medical issues with patient which prohibited therapy............ UE Dopplers to r/o DVT  ___ Treatment cancelled today due to patient receiving procedure or test   ___ Treatment cancelled today due to patient's refusal to participate   ___ Treatment cancelled today due to  Felecia Shelling  PTA [facility]  Acute  Rehab Pager      [phone]

## 2012-12-26 NOTE — Progress Notes (Addendum)
Pt c/o increased pain in right arm.  Right arm edematous and arm band tight (had patient 3 days ago and this is new finding).  Medicated with prn pain med and Dr. Susie Cassette informed.  Doppler of upper extremity ordered and arm band replaced. Correction to above documentation - edema is in left arm and not right.

## 2012-12-26 NOTE — Progress Notes (Signed)
*  PRELIMINARY RESULTS* Vascular Ultrasound Left upper extremity venous duplex has been completed.  Preliminary findings: negative for deep and superficial thrombosis in the left upper extremity.    Farrel Demark, RDMS, RVT  12/26/2012, 3:50 PM
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
## 2012-12-26 NOTE — Progress Notes (Signed)
CSW contacted patient's wife to give bed offers. Patient's wife would like some time to research the options before making a decision. CSW informed patient's wife that DC will likely be Monday. Roddie Mc, [place], [place], [phone]

## 2012-12-27 ENCOUNTER — Encounter (HOSPITAL_COMMUNITY): Payer: Self-pay | Admitting: *Deleted

## 2012-12-27 LAB — GLUCOSE, CAPILLARY
Glucose-Capillary: 116 mg/dL — ABNORMAL HIGH (ref 70–99)
Glucose-Capillary: 113 mg/dL — ABNORMAL HIGH (ref 70–99)
Glucose-Capillary: 163 mg/dL — ABNORMAL HIGH (ref 70–99)

## 2012-12-27 LAB — CULTURE, BLOOD (ROUTINE X 2)
Culture: NO GROWTH
Culture: NO GROWTH

## 2012-12-27 LAB — COMPREHENSIVE METABOLIC PANEL
ALT: 20 U/L (ref 0–53)
AST: 25 U/L (ref 0–37)
Albumin: 2.1 g/dL — ABNORMAL LOW (ref 3.5–5.2)
Alkaline Phosphatase: 57 U/L (ref 39–117)
BUN: 16 mg/dL (ref 6–23)
CO2: 24 mEq/L (ref 19–32)
Calcium: 9 mg/dL (ref 8.4–10.5)
Chloride: 103 mEq/L (ref 96–112)
Creatinine, Ser: 0.9 mg/dL (ref 0.50–1.35)
GFR calc Af Amer: >90 mL/min (ref >90)
GFR calc non Af Amer: 79 mL/min — ABNORMAL LOW (ref >90)
Glucose, Bld: 133 mg/dL — ABNORMAL HIGH (ref 70–99)
Potassium: 3.9 mEq/L (ref 3.5–5.1)
Sodium: 137 mEq/L (ref 135–145)
Total Bilirubin: 0.4 mg/dL (ref 0.3–1.2)
Total Protein: 6.4 g/dL (ref 6.0–8.3)

## 2012-12-27 LAB — MAGNESIUM: Magnesium: 2.7 mg/dL — ABNORMAL HIGH (ref 1.5–2.5)

## 2012-12-27 MED ORDER — METHYLPREDNISOLONE SODIUM SUCC 40 MG IJ SOLR
40.0000 mg | Freq: Once | INTRAMUSCULAR | Status: AC
  Administered 2012-12-27: 40 mg via INTRAVENOUS
  Filled 2012-12-27: qty 1

## 2012-12-27 MED ORDER — COLCHICINE 0.6 MG PO TABS
0.6000 mg | ORAL_TABLET | Freq: Two times a day (BID) | ORAL | Status: DC
  Administered 2012-12-27 – 2012-12-30 (×7): 0.6 mg via ORAL
  Filled 2012-12-27 (×8): qty 1

## 2012-12-27 MED ORDER — ALLOPURINOL 300 MG PO TABS
300.0000 mg | ORAL_TABLET | Freq: Every day | ORAL | Status: DC
  Administered 2012-12-27 – 2012-12-30 (×4): 300 mg via ORAL
  Filled 2012-12-27 (×4): qty 1

## 2012-12-27 NOTE — Progress Notes (Addendum)
[facility] PROGRESS NOTE  [name]:[id number] DOB: Jan 04, 1931 DOA: 12/20/2012 PCP: Berenda Morale, MD  Assessment/Plan: Principal Problem:   Sepsis Active Problems:   Cellulitis   Hypotension   Syncope   Diabetes mellitus   Hyperlipidemia   Morbid obesity   Hypokalemia   Gout     Assessment/Plan:  1. Cellulitis; bilateral feet , MRI does not show any osteomyelitis, the subcutaneous edema related to cellulitis, continue antibiotics, ABI on the right indicating a mild reduction in arterial flow, blood culture no growths of R., 2-D echo shows no vegetation.continue iv abx, switch to orals on Monday prior to DC  2. DM; continue to control with SSI,; hemoglobin A1c= 6.2 3. HLD obtain lipid panel 4. Sepsis see #1, blood culture negative to date 5. DVT rule out; Doppler negative 6. Hypokalemia; will replete potassium today and obtain a.m. Labs   7 Gout; Uric acid within normal limits, GIVEN HX Of GOUT START SOLUMEDROL,ALLOPURINOL, COLCHICINE  8. Disposition may need SNF, social work consultation placed   9.left upper extremity edema. The patient's arm is extremely painful to touch, ultrasound ruled  out DVT ,start rx for gout  Code Status: Full   Consultants:  Procedures:  Bilateral lower extremity ultrasound 12/23/2012  Bilateral lower extremity venous duplex completed.  Preliminary report: Bilateral: No evidence of DVT, superficial thrombosis, or Baker's Cyst.  Antibiotics:  Maxipime, fluconazole, vancomycin day 6 HPI/Subjective:  [name] is a 79 y.o. BM PMHx sepsis, hypotension, legally blind secondary to glaucoma, DM, DM neuropathy, gout HLD, bilateral feet cellulitis. Patient sent to the ED after suffering a syncopal episode in his home at around 8:30 pm that was witnessed by his wife. He "states that a wave came over him" and he blacked out. He denies having chest pain or headache or dizziness prior to the episode. He denies having any fevers or  chills or nausea vomiting or diarrhea. He was evaluated in the ED and was found to have hypotension and on examination he was found to have erythema and wet skin breakdown of both feet along with hemorrhagic blisters on the sole of his Left foot. Laboratory studies revealed an elevated lactate level and he was placed on antibiotic coverage for Cellulitis and Sepsis. With IV Vancomycin and Cefepime and was referred for medical admission. NOTE after seeing patient in a.m. nurse paged me to patient's bedside patient was complaining of bilateral calf pain Lt> Rt and on palpation pain consistent with possible DVT    Left arm swelling improved but still painful      Objective: Filed Vitals:   12/26/12 0956 12/26/12 1408 12/26/12 2155 12/27/12 0502  BP: 141/81 137/84 140/91 145/89  Pulse: 100 97 91 92  Temp: 98.9 F (37.2 C) 98.8 F (37.1 C) 99.1 F (37.3 C) 99.6 F (37.6 C)  TempSrc: Oral Oral Oral Oral  Resp: 20 20 20 18   Height:      Weight:    101 kg (222 lb 10.6 oz)  SpO2: 95% 97% 98% 94%    Intake/Output Summary (Last 24 hours) at 12/27/12 1032 Last data filed at 12/27/12 0700  Gross per 24 hour  Intake 2673.75 ml  Output    302 ml  Net 2371.75 ml    Exam:  HENT:  Head: Atraumatic.  Nose: Nose normal.  Mouth/Throat: Oropharynx is clear and moist.  Eyes: Conjunctivae are normal. Pupils are equal, round, and reactive to light. No scleral icterus.  Neck: Neck supple. No tracheal deviation present.  Cardiovascular: Normal rate, regular rhythm, normal heart sounds and intact distal pulses.  Pulmonary/Chest: Effort normal and breath sounds normal. No respiratory distress.  Abdominal: Soft. Normal appearance and bowel sounds are normal. She exhibits no distension. There is no tenderness.  Musculoskeletal: She exhibits no edema and no tenderness.  Neurological: She is alert. No cranial nerve deficit.    Data Reviewed: Basic Metabolic Panel:  Recent Labs Lab 12/21/12 0410  12/23/12 1932 12/24/12 0440 12/25/12 0435 12/26/12 0540 12/27/12 0430  NA 139  --  140 139 139 137  K 3.0* 3.4* 3.1* 3.9 3.9 3.9  CL 103  --  101 104 103 103  CO2 26  --  28 24 24 24   GLUCOSE 105*  --  142* 134* 119* 133*  BUN 23  --  12 17 16 16   CREATININE 1.66*  --  0.92 0.95 0.93 0.90  CALCIUM 8.4  --  9.2 9.0 9.0 9.0  MG  --   --  1.7 2.0 2.2 2.7*    Liver Function Tests:  Recent Labs Lab 12/20/12 2324 12/24/12 0440 12/25/12 0435 12/26/12 0540 12/27/12 0430  AST 10 10 10 19 25   ALT <5 5 5 13 20   ALKPHOS 69 63 58 58 57  BILITOT 0.5 0.6 0.4 0.4 0.4  PROT 6.3 6.9 6.3 6.4 6.4  ALBUMIN 3.1* 2.8* 2.5* 2.2* 2.1*   No results found for this basename: LIPASE, AMYLASE,  in the last 168 hours No results found for this basename: AMMONIA,  in the last 168 hours  CBC:  Recent Labs Lab 12/20/12 2324 12/21/12 0410 12/25/12 0435  WBC 7.8 6.9 8.3  HGB 12.2* 11.3* 11.7*  HCT 37.7* 34.2* 33.9*  MCV 88.7 87.9 86.0  PLT 153 173 201    Cardiac Enzymes:  Recent Labs Lab 12/20/12 2323  TROPONINI <0.30   BNP (last 3 results) No results found for this basename: PROBNP,  in the last 8760 hours   CBG:  Recent Labs Lab 12/26/12 0622 12/26/12 1113 12/26/12 1626 12/26/12 2113 12/27/12 0605  GLUCAP 123* 135* 112* 115* 116*    Recent Results (from the past 240 hour(s))  CULTURE, BLOOD (ROUTINE X 2)     Status: None   Collection Time    12/20/12 11:10 PM      Result Value Range Status   Specimen Description BLOOD RIGHT ARM   Final   Special Requests BOTTLES DRAWN AEROBIC AND ANAEROBIC 10CC EA   Final   Culture  Setup Time 12/21/2012 15:06   Final   Culture     Final   Value:        BLOOD CULTURE RECEIVED NO GROWTH TO DATE CULTURE WILL BE HELD FOR 5 DAYS BEFORE ISSUING A FINAL NEGATIVE REPORT   Report Status PENDING   Incomplete  CULTURE, BLOOD (ROUTINE X 2)     Status: None   Collection Time    12/20/12 11:28 PM      Result Value Range Status   Specimen  Description BLOOD RIGHT HAND   Final   Special Requests BOTTLES DRAWN AEROBIC ONLY 10CC   Final   Culture  Setup Time 12/21/2012 15:07   Final   Culture     Final   Value:        BLOOD CULTURE RECEIVED NO GROWTH TO DATE CULTURE WILL BE HELD FOR 5 DAYS BEFORE ISSUING A FINAL NEGATIVE REPORT   Report Status PENDING   Incomplete  URINE CULTURE     Status:  None   Collection Time    12/21/12  1:20 AM      Result Value Range Status   Specimen Description URINE, RANDOM   Final   Special Requests NONE   Final   Culture  Setup Time 12/21/2012 01:49   Final   Colony Count NO GROWTH   Final   Culture NO GROWTH   Final   Report Status 12/22/2012 FINAL   Final  CULTURE, BLOOD (ROUTINE X 2)     Status: None   Collection Time    12/24/12  9:35 AM      Result Value Range Status   Specimen Description BLOOD LEFT HAND   Final   Special Requests     Final   Value: BOTTLES DRAWN AEROBIC AND ANAEROBIC AERO 10CC ANA 8CC   Culture  Setup Time 12/24/2012 17:23   Final   Culture     Final   Value:        BLOOD CULTURE RECEIVED NO GROWTH TO DATE CULTURE WILL BE HELD FOR 5 DAYS BEFORE ISSUING A FINAL NEGATIVE REPORT   Report Status PENDING   Incomplete  CULTURE, BLOOD (ROUTINE X 2)     Status: None   Collection Time    12/24/12  9:45 AM      Result Value Range Status   Specimen Description BLOOD RIGHT HAND   Final   Special Requests BOTTLES DRAWN AEROBIC ONLY 10CC   Final   Culture  Setup Time 12/24/2012 17:22   Final   Culture     Final   Value:        BLOOD CULTURE RECEIVED NO GROWTH TO DATE CULTURE WILL BE HELD FOR 5 DAYS BEFORE ISSUING A FINAL NEGATIVE REPORT   Report Status PENDING   Incomplete     Studies: Dg Chest 2 View  12/24/2012   *RADIOLOGY REPORT*  Clinical Data: Fever.  CHEST - 2 VIEW  Comparison: 12/20/2012.  Findings: Elevated right hemidiaphragm.  On the lateral view, increased markings right lung base possibly representing infiltrate versus crowding of lung markings.  Pulmonary  vascular congestion most notable centrally.  Tortuous aorta.  Heart size within normal limits.  Spinal stimulating device in place T7-T8 level.  Shoulder joint degenerative changes.  IMPRESSION: Elevated right hemidiaphragm.  On the lateral view, increased markings right lung base possibly representing infiltrate versus crowding of lung markings.  Pulmonary vascular congestion most notable centrally.  Tortuous aorta.   Original Report Authenticated By: Lacy Duverney, M.D.   Mr Foot Right Wo Contrast  12/26/2012   *RADIOLOGY REPORT*  Clinical Data: Abscess.  Foot pain.  MRI OF THE RIGHT FOREFOOT WITHOUT CONTRAST  Technique:  Multiplanar, multisequence MR imaging was performed. No intravenous contrast was administered.  Comparison: None.  Findings: There is no soft tissue abscess identified.  Diffuse dorsal forefoot edema is present which may represent dependent edema or cellulitis in the appropriate clinical setting.  Mild edema in the plantar foot musculature.  The flexor tendons appear within normal limits.  Visualized plantar fascia appears normal. No erosions or evidence of osteomyelitis.  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  IMPRESSION: Negative for abscess or osteomyelitis.  Subcutaneous edema more prominent over the dorsum of the foot.  This is commonly seen with dependent edema or cellulitis in the appropriate clinical setting.   Original Report Authenticated By: Andreas Newport, M.D.   Mr Foot Left Wo Contrast  12/26/2012   *RADIOLOGY REPORT*  Clinical Data: Bilateral foot cellulitis.  Diabetic patient.  MRI OF THE LEFT FOREFOOT WITHOUT CONTRAST  Technique:  Multiplanar, multisequence MR imaging was performed. No intravenous contrast was administered.  Comparison: None.  Findings: There is some subcutaneous edema about the foot.  No abscess is identified.  No bone marrow signal abnormality to suggest osteomyelitis is present.  Intrinsic musculature of the foot demonstrates mild fatty  atrophy.  Degenerative disease is seen in the hind foot, particularly about the middle facet of the subtalar joint.  All visualized tendons appear intact.  IMPRESSION:  1.  Subcutaneous edema without abscess or evidence of osteomyelitis. 2.  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   Original Report Authenticated By: Holley Dexter, M.D.   Dg Chest Portable 1 View  12/20/2012   *RADIOLOGY REPORT*  Clinical Data: Shortness of breath with exertion; history of diabetes.  Sepsis.  PORTABLE CHEST - 1 VIEW  Comparison: Chest radiograph performed 03/24/2012, and CT of the chest performed 04/15/2012  Findings: The lungs are mildly hypoexpanded.  Mild vascular congestion is seen.  Mild bibasilar airspace opacities raise concern for minimal interstitial edema.  No pleural effusion or pneumothorax is seen.  The cardiomediastinal silhouette is within normal limits.  No acute osseous abnormalities are seen.  Thoracic spinal stimulation leads are partially imaged.  Degenerative change is noted at both glenohumeral joints.  IMPRESSION: Lungs mildly hypoexpanded.  Mild vascular congestion seen.  Mild bibasilar airspace opacities raise concern for minimal interstitial edema.   Original Report Authenticated By: Tonia Ghent, M.D.    Scheduled Meds: . allopurinol  300 mg Oral Daily  . brinzolamide  1 drop Right Eye TID   And  . brimonidine  1 drop Right Eye TID  . ceFEPime (MAXIPIME) IV  1 g Intravenous Q12H  . colchicine  0.6 mg Oral BID  . cyclopentolate  1 drop Both Eyes TID  . dorzolamide  1 drop Right Eye TID  . enoxaparin (LOVENOX) injection  40 mg Subcutaneous Q24H  . insulin aspart  0-5 Units Subcutaneous QHS  . insulin aspart  0-9 Units Subcutaneous TID WC  . methylPREDNISolone (SOLU-MEDROL) injection  40 mg Intravenous Once  . polyvinyl alcohol  1 drop Left Eye TID  . potassium chloride  10 mEq Oral Daily  . vancomycin  1,500 mg Intravenous Q24H    Continuous Infusions: . 0.9 % NaCl with KCl 20 mEq / L 75 mL/hr at 12/27/12 0457    Principal Problem:   Sepsis Active Problems:   Cellulitis   Hypotension   Syncope   Diabetes mellitus   Hyperlipidemia   Morbid obesity   Hypokalemia   Gout    Time spent: 40 minutes   [facility]  [facility] Pager [phone]. If 8PM-8AM, please contact night-coverage at [email or web address], password [facility] 12/27/2012, 10:32 AM  LOS: 7 days

## 2012-12-27 NOTE — Progress Notes (Signed)
ANTIBIOTIC CONSULT NOTE - Follow Up  Pharmacy Consult for Vancocin Indication: rule out sepsis and cellulitis  No Known Allergies  Patient Measurements: Weight: 94.1 kg  Vital Signs: Temp: 99.6 F (37.6 C) (07/26 0502) Temp src: Oral (07/26 0502) BP: 145/89 mmHg (07/26 0502) Pulse Rate: 92 (07/26 0502)  Labs:  Recent Labs  12/25/12 0435 12/26/12 0540 12/27/12 0430  WBC 8.3  --   --   HGB 11.7*  --   --   PLT 201  --   --   CREATININE 0.95 0.93 0.90    Microbiology: Recent Results (from the past 720 hour(s))  CULTURE, BLOOD (ROUTINE X 2)     Status: None   Collection Time    12/20/12 11:10 PM      Result Value Range Status   Specimen Description BLOOD RIGHT ARM   Final   Special Requests BOTTLES DRAWN AEROBIC AND ANAEROBIC 10CC EA   Final   Culture  Setup Time 12/21/2012 15:06   Final   Culture     Final   Value:        BLOOD CULTURE RECEIVED NO GROWTH TO DATE CULTURE WILL BE HELD FOR 5 DAYS BEFORE ISSUING A FINAL NEGATIVE REPORT   Report Status PENDING   Incomplete  CULTURE, BLOOD (ROUTINE X 2)     Status: None   Collection Time    12/20/12 11:28 PM      Result Value Range Status   Specimen Description BLOOD RIGHT HAND   Final   Special Requests BOTTLES DRAWN AEROBIC ONLY 10CC   Final   Culture  Setup Time 12/21/2012 15:07   Final   Culture     Final   Value:        BLOOD CULTURE RECEIVED NO GROWTH TO DATE CULTURE WILL BE HELD FOR 5 DAYS BEFORE ISSUING A FINAL NEGATIVE REPORT   Report Status PENDING   Incomplete  URINE CULTURE     Status: None   Collection Time    12/21/12  1:20 AM      Result Value Range Status   Specimen Description URINE, RANDOM   Final   Special Requests NONE   Final   Culture  Setup Time 12/21/2012 01:49   Final   Colony Count NO GROWTH   Final   Culture NO GROWTH   Final   Report Status 12/22/2012 FINAL   Final  CULTURE, BLOOD (ROUTINE X 2)     Status: None   Collection Time    12/24/12  9:35 AM      Result Value Range Status   Specimen Description BLOOD LEFT HAND   Final   Special Requests     Final   Value: BOTTLES DRAWN AEROBIC AND ANAEROBIC AERO 10CC ANA 8CC   Culture  Setup Time 12/24/2012 17:23   Final   Culture     Final   Value:        BLOOD CULTURE RECEIVED NO GROWTH TO DATE CULTURE WILL BE HELD FOR 5 DAYS BEFORE ISSUING A FINAL NEGATIVE REPORT   Report Status PENDING   Incomplete  CULTURE, BLOOD (ROUTINE X 2)     Status: None   Collection Time    12/24/12  9:45 AM      Result Value Range Status   Specimen Description BLOOD RIGHT HAND   Final   Special Requests BOTTLES DRAWN AEROBIC ONLY 10CC   Final   Culture  Setup Time 12/24/2012 17:22   Final   Culture  Final   Value:        BLOOD CULTURE RECEIVED NO GROWTH TO DATE CULTURE WILL BE HELD FOR 5 DAYS BEFORE ISSUING A FINAL NEGATIVE REPORT   Report Status PENDING   Incomplete    Medical History: Past Medical History  Diagnosis Date  . Gout     Right foot and leg  . Shortness of breath     with exerertion  . Sleep apnea   . Diabetes mellitus   . Hypertension   . Hyperlipidemia   . Gastric ulcer     50 Years ago  . Arthritis     All Joints  . Insomnia   . Glaucoma   . Morbid obesity     Assessment: 79yo male was in normal state of health when family witnessed him "pass out" and become unresponsive, EMS found BP down to 50s, now A&Ox4 w/ BP to 97, to begin IV ABX for possible sepsis and cellulitis. Vanc trough 10.0 7/23  Goal of Therapy:  Vanc trough 10-15 mg/L  Plan:  Continue vancomycin 1500mg  IV Q24H and monitor renal function and clinical course.  Talbert Cage, PharmD 12/27/2012,10:42 AM

## 2012-12-28 LAB — GLUCOSE, CAPILLARY
Glucose-Capillary: 167 mg/dL — ABNORMAL HIGH (ref 70–99)
Glucose-Capillary: 127 mg/dL — ABNORMAL HIGH (ref 70–99)
Glucose-Capillary: 171 mg/dL — ABNORMAL HIGH (ref 70–99)
Glucose-Capillary: 170 mg/dL — ABNORMAL HIGH (ref 70–99)
Glucose-Capillary: 178 mg/dL — ABNORMAL HIGH (ref 70–99)

## 2012-12-28 LAB — MAGNESIUM: Magnesium: 2.4 mg/dL (ref 1.5–2.5)

## 2012-12-28 MED ORDER — CIPROFLOXACIN HCL 500 MG PO TABS
500.0000 mg | ORAL_TABLET | Freq: Two times a day (BID) | ORAL | Status: DC
  Administered 2012-12-28 – 2012-12-30 (×5): 500 mg via ORAL
  Filled 2012-12-28 (×7): qty 1

## 2012-12-28 MED ORDER — GABAPENTIN 300 MG PO CAPS
300.0000 mg | ORAL_CAPSULE | Freq: Three times a day (TID) | ORAL | Status: DC
  Administered 2012-12-28 – 2012-12-30 (×7): 300 mg via ORAL
  Filled 2012-12-28 (×9): qty 1

## 2012-12-28 MED ORDER — METHYLPREDNISOLONE SODIUM SUCC 40 MG IJ SOLR
40.0000 mg | Freq: Two times a day (BID) | INTRAMUSCULAR | Status: DC
  Administered 2012-12-28 – 2012-12-29 (×4): 40 mg via INTRAVENOUS
  Filled 2012-12-28 (×6): qty 1
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
## 2012-12-29 ENCOUNTER — Inpatient Hospital Stay (HOSPITAL_COMMUNITY): Payer: Medicare Other

## 2012-12-29 LAB — BASIC METABOLIC PANEL
BUN: 27 mg/dL — ABNORMAL HIGH (ref 6–23)
CO2: 25 mEq/L (ref 19–32)
Calcium: 8.8 mg/dL (ref 8.4–10.5)
Chloride: 103 mEq/L (ref 96–112)
Creatinine, Ser: 0.92 mg/dL (ref 0.50–1.35)
GFR calc Af Amer: >90 mL/min (ref >90)
GFR calc non Af Amer: 78 mL/min — ABNORMAL LOW (ref >90)
Glucose, Bld: 169 mg/dL — ABNORMAL HIGH (ref 70–99)
Potassium: 4.3 mEq/L (ref 3.5–5.1)
Sodium: 137 mEq/L (ref 135–145)

## 2012-12-29 LAB — GLUCOSE, CAPILLARY
Glucose-Capillary: 158 mg/dL — ABNORMAL HIGH (ref 70–99)
Glucose-Capillary: 201 mg/dL — ABNORMAL HIGH (ref 70–99)
Glucose-Capillary: 200 mg/dL — ABNORMAL HIGH (ref 70–99)
Glucose-Capillary: 221 mg/dL — ABNORMAL HIGH (ref 70–99)

## 2012-12-29 LAB — MAGNESIUM: Magnesium: 2.3 mg/dL (ref 1.5–2.5)

## 2012-12-29 NOTE — Progress Notes (Signed)
Bed offers in place and patient/wife request bed at [facility]. Bed is available per Nathanial Rancher- Admissions Director of [facility].  Per Dr. Susie Cassette- d/c today will be delayed due to swelling in his hand and arm. Plan d/c tomorrow if medically stable.  Ok per facility, wife and patient. CSW will monitor.  Lorri Frederick. [place]  [phone]

## 2012-12-29 NOTE — Progress Notes (Signed)
Continues with left arm pain esp hand and wrist area, hand and wrist are warm to touch, also swollen, elevated on pillow to decrease swelling, oxycodone 5mg  given for pain, Berle Mull RN
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
## 2012-12-29 NOTE — Progress Notes (Signed)
Pt in  Bed resting quietly.  Family members at the bedside.  Will continue to monitor.  Amanda Pea, Charity fundraiser.

## 2012-12-30 LAB — GLUCOSE, CAPILLARY
Glucose-Capillary: 170 mg/dL — ABNORMAL HIGH (ref 70–99)
Glucose-Capillary: 194 mg/dL — ABNORMAL HIGH (ref 70–99)

## 2012-12-30 LAB — CULTURE, BLOOD (ROUTINE X 2)
Culture: NO GROWTH
Culture: NO GROWTH

## 2012-12-30 LAB — MAGNESIUM: Magnesium: 2.3 mg/dL (ref 1.5–2.5)

## 2012-12-30 MED ORDER — CIPROFLOXACIN HCL 500 MG PO TABS: 500.0000 mg | ORAL_TABLET | Freq: Two times a day (BID) | ORAL | Status: AC

## 2012-12-30 MED ORDER — AMLODIPINE BESYLATE 5 MG PO TABS: 5.0000 mg | ORAL_TABLET | Freq: Every day | ORAL | Status: DC

## 2012-12-30 MED ORDER — GABAPENTIN 300 MG PO CAPS: 300.0000 mg | ORAL_CAPSULE | Freq: Three times a day (TID) | ORAL | Status: DC

## 2012-12-30 MED ORDER — PREDNISONE 20 MG PO TABS: 40.0000 mg | ORAL_TABLET | Freq: Every day | ORAL | Status: AC

## 2012-12-30 MED ORDER — LOSARTAN POTASSIUM 25 MG PO TABS: 25.0000 mg | ORAL_TABLET | Freq: Every day | ORAL | Status: DC

## 2012-12-30 MED ORDER — COLCHICINE 0.6 MG PO TABS: 0.6000 mg | ORAL_TABLET | Freq: Two times a day (BID) | ORAL | Status: AC

## 2012-12-30 MED ORDER — OXYCODONE HCL 5 MG PO TABS: 5.0000 mg | ORAL_TABLET | ORAL | Status: DC | PRN

## 2012-12-30 MED ORDER — DOXYCYCLINE HYCLATE 50 MG PO CAPS: 50.0000 mg | ORAL_CAPSULE | Freq: Two times a day (BID) | ORAL | Status: AC

## 2012-12-30 NOTE — Progress Notes (Signed)
PT Cancellation Note  Patient Details Name: [name] MRN: [id number] DOB: 1930/10/06   Cancelled Treatment:     RN states pt is d/cing today.      Verdell Face, [place] [phone] 12/30/2012
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
## 2012-12-30 NOTE — Clinical Social Work Placement (Signed)
     Clinical Social Work Department CLINICAL SOCIAL WORK PLACEMENT NOTE 12/30/2012  Patient:  [name], [name]  Account Number:  [id number] Admit date:  12/20/2012  Clinical Social Worker:  Irving Burton [place], LCSWA  Date/time:  12/25/2012 02:32 PM  Clinical Social Work is seeking post-discharge placement for this patient at the following level of care:   SKILLED NURSING   (*CSW will update this form in Epic as items are completed)   12/25/2012  Patient/family provided with [facility] Department of Clinical Social Works list of facilities offering this level of care within the geographic area requested by the patient (or if unable, by the patients family).  12/25/2012  Patient/family informed of their freedom to choose among providers that offer the needed level of care, that participate in Medicare, Medicaid or managed care program needed by the patient, have an available bed and are willing to accept the patient.  12/25/2012  Patient/family informed of [facility] ownership interest in [facility], as well as of the fact that they are under no obligation to receive care at this facility.  PASARR submitted to EDS on 12/25/2012 PASARR number received from EDS on 12/25/2012  FL2 transmitted to all facilities in geographic area requested by pt/family on  12/25/2012 FL2 transmitted to all facilities within larger geographic area on   Patient informed that his/her managed care company has contracts with or will negotiate with  certain facilities, including the following:     Patient/family informed of bed offers received:  12/26/2012 Patient chooses bed at [facility] Physician recommends and patient chooses bed at    Patient to be transferred to [facility] on  12/30/2012 Patient to be transferred to facility by [facility]  The following physician request were entered in Epic:   Additional Comments: 12/30/12  OK per MD for  d/c today to SNF. Patient and wife are very pleased with d/c choice of [facility].  Notified SNF and pt's nurse of d/c plan; nurse will call report to SNF. No further CSW needs identified. CSW signing off. Lorri Frederick. Lysa Livengood, LCSWA  [phone]

## 2013-04-08 ENCOUNTER — Ambulatory Visit (INDEPENDENT_AMBULATORY_CARE_PROVIDER_SITE_OTHER): Payer: Medicare Other | Admitting: Ophthalmology

## 2013-04-08 DIAGNOSIS — H4050X0 Glaucoma secondary to other eye disorders, unspecified eye, stage unspecified: Secondary | ICD-10-CM

## 2013-04-08 DIAGNOSIS — I10 Essential (primary) hypertension: Secondary | ICD-10-CM

## 2013-04-08 DIAGNOSIS — H43819 Vitreous degeneration, unspecified eye: Secondary | ICD-10-CM

## 2013-04-08 DIAGNOSIS — H35039 Hypertensive retinopathy, unspecified eye: Secondary | ICD-10-CM

## 2013-04-08 DIAGNOSIS — H348192 Central retinal vein occlusion, unspecified eye, stable: Secondary | ICD-10-CM

## 2013-04-21 ENCOUNTER — Other Ambulatory Visit (HOSPITAL_COMMUNITY): Payer: Self-pay | Admitting: Pain Medicine

## 2013-04-21 DIAGNOSIS — M25511 Pain in right shoulder: Secondary | ICD-10-CM

## 2013-04-21 DIAGNOSIS — M25512 Pain in left shoulder: Secondary | ICD-10-CM

## 2013-04-27 ENCOUNTER — Ambulatory Visit (HOSPITAL_COMMUNITY)
Admission: RE | Admit: 2013-04-27 | Discharge: 2013-04-27 | Disposition: A | Discharge status: Home / Self Care | Payer: Medicare Other | Source: Ambulatory Visit | Attending: Pain Medicine | Admitting: Pain Medicine

## 2013-04-27 DIAGNOSIS — X58XXXA Exposure to other specified factors, initial encounter: Secondary | ICD-10-CM | POA: Insufficient documentation

## 2013-04-27 DIAGNOSIS — M25511 Pain in right shoulder: Secondary | ICD-10-CM

## 2013-04-27 DIAGNOSIS — M659 Unspecified synovitis and tenosynovitis, unspecified site: Secondary | ICD-10-CM | POA: Insufficient documentation

## 2013-04-27 DIAGNOSIS — M6250 Muscle wasting and atrophy, not elsewhere classified, unspecified site: Secondary | ICD-10-CM | POA: Insufficient documentation

## 2013-04-27 DIAGNOSIS — M19019 Primary osteoarthritis, unspecified shoulder: Secondary | ICD-10-CM | POA: Insufficient documentation

## 2013-04-27 DIAGNOSIS — M25419 Effusion, unspecified shoulder: Secondary | ICD-10-CM | POA: Insufficient documentation

## 2013-04-27 DIAGNOSIS — S46819A Strain of other muscles, fascia and tendons at shoulder and upper arm level, unspecified arm, initial encounter: Secondary | ICD-10-CM | POA: Insufficient documentation

## 2013-04-27 DIAGNOSIS — M67919 Unspecified disorder of synovium and tendon, unspecified shoulder: Secondary | ICD-10-CM | POA: Insufficient documentation

## 2013-04-27 DIAGNOSIS — M25512 Pain in left shoulder: Secondary | ICD-10-CM

## 2013-04-27 DIAGNOSIS — M25519 Pain in unspecified shoulder: Secondary | ICD-10-CM | POA: Insufficient documentation

## 2013-04-27 DIAGNOSIS — M719 Bursopathy, unspecified: Secondary | ICD-10-CM | POA: Insufficient documentation

## 2013-09-16 NOTE — H&P (Signed)
[name] is an 80 y.o. male.    Chief Complaint: right shoulder pain   HPI: Pt is a 80 y.o. male complaining of right shoulder pain for multiple years. Pain had continually increased since the beginning. X-rays in the clinic show end-stage arthritic changes of the right shoulder. Pt has tried various conservative treatments which have failed to alleviate their symptoms, including injections and therapy. Various options are discussed with the patient. Risks, benefits and expectations were discussed with the patient. Patient understand the risks, benefits and expectations and wishes to proceed with surgery.   PCP:  BABAOFF, Lavada MesiMARC E, MD  D/C Plans:  Home with HHPT  PMH: Past Medical History  Diagnosis Date  . Gout     Right foot and leg  . Shortness of breath     with exerertion  . Sleep apnea   . Diabetes mellitus   . Hypertension   . Hyperlipidemia   . Gastric ulcer     50 Years ago  . Arthritis     All Joints  . Insomnia   . Glaucoma   . Morbid obesity     PSH: Past Surgical History  Procedure Laterality Date  . Cataract extraction w/ intraocular lens  implant, bilateral    . Knee arthroscopy      1990'  . Back surgery    . Glaucoma surgery      bil  . Mini shunt insertion  09/26/2011    Procedure: INSERTION OF MINI SHUNT;  Surgeon: Chalmers Guestoy Whitaker, MD;  [facility];  Service: Ophthalmology;  Laterality: Right;  Insertion of Ahmed shunt    Social History:  reports that he quit smoking about 30 years ago. His smoking use included Cigarettes. He smoked 2.00 packs per day. He has never used smokeless tobacco. He reports that he does not drink alcohol or use illicit drugs.  Allergies:  No Known Allergies  Medications: No current facility-administered medications for this encounter.   Current Outpatient Prescriptions  Medication Sig Dispense Refill  . allopurinol (ZYLOPRIM) 300 MG tablet Take 300 mg by mouth daily.      Marland Kitchen. amLODipine (NORVASC) 5 MG tablet  Take 1 tablet (5 mg total) by mouth daily.  30 tablet  0  . Brinzolamide-Brimonidine (SIMBRINZA) 1-0.2 % SUSP Apply 1 drop to eye 3 (three) times daily.      . cyclopentolate (CYCLODRYL,CYCLOGYL) 1 % ophthalmic solution Place 1 drop into both eyes 3 (three) times daily.      . dorzolamide (TRUSOPT) 2 % ophthalmic solution Place 1 drop into the right eye 3 (three) times daily.       Marland Kitchen. gabapentin (NEURONTIN) 300 MG capsule Take 1 capsule (300 mg total) by mouth 3 (three) times daily.  90 capsule  2  . ketoconazole (NIZORAL) 2 % cream Apply 1 application topically 2 (two) times daily.       Marland Kitchen. losartan (COZAAR) 25 MG tablet Take 1 tablet (25 mg total) by mouth daily.  30 tablet  2  . metFORMIN (GLUCOPHAGE) 1000 MG tablet Take 1,000 mg by mouth 2 (two) times daily with a meal.      . oxyCODONE (OXY IR/ROXICODONE) 5 MG immediate release tablet Take 1 tablet (5 mg total) by mouth every 4 (four) hours as needed.  40 tablet  0  . Polyethyl Glycol-Propyl Glycol (SYSTANE OP) Place 1 drop into the left eye 3 (three) times daily.      . pravastatin (PRAVACHOL) 40 MG tablet Take 40  mg by mouth daily.      . Tamsulosin HCl (FLOMAX) 0.4 MG CAPS Take 0.4 mg by mouth daily.        No results found for this or any previous visit (from the past 48 hour(s)). No results found.  ROS: Pain with rom of the right upper extremity and weakness  Physical Exam:  Alert and oriented 80 y.o. male in no acute distress Cranial nerves 2-12 intact Cervical spine: full rom with no tenderness, nv intact distally Chest: active breath sounds bilaterally, no wheeze rhonchi or rales Heart: regular rate and rhythm, no murmur Abd: non tender non distended with active bowel sounds Hip is stable with rom  Right shoulder with moderate weakness and crepitus with rom nv intact distally No rashes or edema  Assessment/Plan Assessment: right shoulder cuff insufficiency  Plan: Patient will undergo a right reverse total shoulder by  Dr. Ranell Patrick at [facility]. Risks benefits and expectations were discussed with the patient. Patient understand risks, benefits and expectations and wishes to proceed.

## 2013-09-17 ENCOUNTER — Ambulatory Visit (HOSPITAL_COMMUNITY)
Admission: RE | Admit: 2013-09-17 | Discharge: 2013-09-17 | Disposition: A | Discharge status: Home / Self Care | Payer: Medicare HMO | Source: Ambulatory Visit | Attending: Anesthesiology | Admitting: Anesthesiology

## 2013-09-17 ENCOUNTER — Encounter (HOSPITAL_COMMUNITY): Payer: Self-pay

## 2013-09-17 ENCOUNTER — Encounter (HOSPITAL_COMMUNITY)
Admission: RE | Admit: 2013-09-17 | Discharge: 2013-09-17 | Disposition: A | Discharge status: Home / Self Care | Payer: Medicare HMO | Source: Ambulatory Visit | Attending: Orthopedic Surgery | Admitting: Orthopedic Surgery

## 2013-09-17 DIAGNOSIS — Z01818 Encounter for other preprocedural examination: Secondary | ICD-10-CM | POA: Insufficient documentation

## 2013-09-17 DIAGNOSIS — Z01810 Encounter for preprocedural cardiovascular examination: Secondary | ICD-10-CM | POA: Insufficient documentation

## 2013-09-17 DIAGNOSIS — Z01812 Encounter for preprocedural laboratory examination: Secondary | ICD-10-CM | POA: Insufficient documentation

## 2013-09-17 HISTORY — DX: Legal blindness, as defined in USA: H54.8

## 2013-09-17 HISTORY — DX: Pain in unspecified joint: M25.50

## 2013-09-17 HISTORY — DX: Personal history of colonic polyps: Z86.010

## 2013-09-17 HISTORY — DX: Constipation, unspecified: K59.00

## 2013-09-17 HISTORY — DX: Pleurisy: R09.1

## 2013-09-17 HISTORY — DX: Personal history of colon polyps, unspecified: Z86.0100

## 2013-09-17 HISTORY — DX: Effusion, unspecified joint: M25.40

## 2013-09-17 HISTORY — DX: Localized edema: R60.0

## 2013-09-17 HISTORY — DX: Nocturia: R35.1

## 2013-09-17 HISTORY — DX: Unspecified macular degeneration: H35.30

## 2013-09-17 HISTORY — DX: Frequency of micturition: R35.0

## 2013-09-17 HISTORY — DX: Edema, unspecified: R60.9

## 2013-09-17 LAB — CBC
HCT: 43.3 % (ref 39.0–52.0)
Hemoglobin: 14.4 g/dL (ref 13.0–17.0)
MCH: 29.8 pg (ref 26.0–34.0)
MCHC: 33.3 g/dL (ref 30.0–36.0)
MCV: 89.5 fL (ref 78.0–100.0)
Platelets: 190 10*3/uL (ref 150–400)
RBC: 4.84 MIL/uL (ref 4.22–5.81)
RDW: 13.5 % (ref 11.5–15.5)
WBC: 4.6 10*3/uL (ref 4.0–10.5)

## 2013-09-17 LAB — PROTIME-INR
INR: 0.96 (ref 0.00–1.49)
Prothrombin Time: 12.6 seconds (ref 11.6–15.2)

## 2013-09-17 LAB — BASIC METABOLIC PANEL
BUN: 14 mg/dL (ref 6–23)
CO2: 26 mEq/L (ref 19–32)
Calcium: 9.4 mg/dL (ref 8.4–10.5)
Chloride: 101 mEq/L (ref 96–112)
Creatinine, Ser: 1.06 mg/dL (ref 0.50–1.35)
GFR calc Af Amer: 74 mL/min — ABNORMAL LOW (ref >90)
GFR calc non Af Amer: 64 mL/min — ABNORMAL LOW (ref >90)
Glucose, Bld: 117 mg/dL — ABNORMAL HIGH (ref 70–99)
Potassium: 3.6 mEq/L — ABNORMAL LOW (ref 3.7–5.3)
Sodium: 141 mEq/L (ref 137–147)

## 2013-09-17 LAB — TYPE AND SCREEN
ABO/RH(D): O POS
Antibody Screen: NEGATIVE

## 2013-09-17 LAB — APTT: aPTT: 28 seconds (ref 24–37)

## 2013-09-17 MED ORDER — CHLORHEXIDINE GLUCONATE 4 % EX LIQD: 60.0000 mL | Freq: Once | CUTANEOUS | Status: DC

## 2013-09-17 NOTE — Progress Notes (Addendum)
Pt doesn't have a cardiologist   Echo report in epic from 2014  Denies ever having a stress test/heart cath  Medical Md is Dr.Victoria Rankins with [facility]  Denies EKG in past yr and CXR in epic from 12/2012 but pulmonary congestion noted

## 2013-09-17 NOTE — Progress Notes (Signed)
09/17/13 1422  OBSTRUCTIVE SLEEP APNEA  Have you ever been diagnosed with sleep apnea through a sleep study? No  Do you snore loudly (loud enough to be heard through closed doors)?  1  Do you often feel tired, fatigued, or sleepy during the daytime? 0  Has anyone observed you stop breathing during your sleep? 0  Do you have, or are you being treated for high blood pressure? 1  BMI more than 35 kg/m2? 0  Age over 78 years old? 1  Neck circumference greater than 40 cm/18 inches? 0  Gender: 1  Obstructive Sleep Apnea Score 4  Score 4 or greater  Results sent to PCP

## 2013-09-17 NOTE — Pre-Procedure Instructions (Signed)
Judene CompanionSidney M Mccrystal  09/17/2013   Your procedure is scheduled on:  Mon, April 20 @ 12:30 PM  [facility]30  AM.  Call this number if you have problems the morning of surgery: [phone]   Remember:   Do not eat food or drink liquids after midnight.   Take these medicines the morning of surgery with A SIP OF WATER: Allopurinol(Zyloprim),Amlodipine(Norvasc),Eye Drops,Gabapentin(Neurontin),Pain Pill(if needed),and Flomax(Tamsulosin)               No Goody's,BC's,Aleve,Aspirin,Ibuprofen,Fish Oil,or any Herbal Medications   Do not wear jewelry  Do not wear lotions, powders, or colognes.   Men may shave face and neck.  Do not bring valuables to the hospital.  [facility]                  for any belongings or valuables.               Contacts, dentures or bridgework may not be worn into surgery.  Leave suitcase in the car. After surgery it may be brought to your room.  For patients admitted to the hospital, discharge time is determined by your                treatment team.                 Special Instructions:  [place] - Preparing for Surgery  Before surgery, you can play an important role.  Because skin is not sterile, your skin needs to be as free of germs as possible.  You can reduce the number of germs on you skin by washing with CHG (chlorahexidine gluconate) soap before surgery.  CHG is an antiseptic cleaner which kills germs and bonds with the skin to continue killing germs even after washing.  Please DO NOT use if you have an allergy to CHG or antibacterial soaps.  If your skin becomes reddened/irritated stop using the CHG and inform your nurse when you arrive at Short Stay.  Do not shave (including legs and underarms) for at least 48 hours prior to the first CHG shower.  You may shave your face.  Please follow these instructions carefully:   1.  Shower with CHG Soap the night before surgery and the                                 morning of Surgery.  2.  If you choose to wash your hair, wash your hair first as usual with your       normal shampoo.  3.  After you shampoo, rinse your hair and body thoroughly to remove the                      Shampoo.  4.  Use CHG as you would any other liquid soap.  You can apply chg directly       to the skin and wash gently with scrungie or a clean washcloth.  5.  Apply the CHG Soap to your body ONLY FROM THE NECK DOWN.        Do not use on open wounds or open sores.  Avoid contact with your eyes,       ears, mouth and genitals (private parts).  Wash genitals (private parts)       with your normal soap.  6.  Wash thoroughly, paying  special attention to the area where your surgery        will be performed.  7.  Thoroughly rinse your body with warm water from the neck down.  8.  DO NOT shower/wash with your normal soap after using and rinsing off       the CHG Soap.  9.  Pat yourself dry with a clean towel.            10.  Wear clean pajamas.            11.  Place clean sheets on your bed the night of your first shower and do not        sleep with pets.  Day of Surgery  Do not apply any lotions/deoderants the morning of surgery.  Please wear clean clothes to the hospital/surgery center.     Please read over the following fact sheets that you were given: Pain Booklet, Coughing and Deep Breathing, Blood Transfusion Information and Surgical Site Infection Prevention

## 2013-09-18 NOTE — Progress Notes (Signed)
Anesthesia chart review: Patient is a 80 year old male scheduled for right reverse total shoulder arthroplasty on 09/21/13 by Dr. Devonne Doughty. History includes former smoker, hypertension, obstructive sleep apnea, diabetes mellitus type 2, arthritis, gout, hyperlipidemia, gastric ulcer, glaucoma, peripheral edema, macular degeneration, legally blind, back and eye surgeries. BMI is 32.42 consistent with obesity. OSA screening score is 4.  PCP was reported as Dr. [name].  Her partner Dr. Duane Lope medically cleared patient for this procedure.  EKG on 09/17/13 showed NSR, right BBB, LAFB, bifascicular block, septal infarct (age undetermined). She has multiple EKGs in [place] dating back to 2007.  Overall, I think these EKGs are stable (particularly in comparison with EKGs from 2008 and 2010).  Echo on 12/24/12 showed: - Left ventricle: Technically limited study. The cavity size was normal. Wall thickness was normal. The estimated ejection fraction was 60%. Images were inadequate for LV wall motion assessment. - Right ventricle: Not able to assess RV function due to poor visualization. Not able to assess RV size due to poor visualization. - Impressions: No obvious vegetations are seen.  CXR on 09/17/13 showed no active cardiopulmonary disease.  Preoperative labs noted.   She has had a bifascicular block for at least 7 years.  Echo last year showed normal EF, but was inadequate to assess wall motion.  She was medically cleared for this procedure.  Further evaluation by her assigned anesthesiologist on the day of surgery.  If no acute changes or new CV symptoms then I would anticipate that she could proceed as planned.  Velna Ochs [facility] Short Stay Center/Anesthesiology Phone [phone] 09/18/2013 11:27 AM

## 2013-09-20 MED ORDER — CEFAZOLIN SODIUM-DEXTROSE 2-3 GM-% IV SOLR: 2.0000 g | INTRAVENOUS | Status: DC

## 2013-09-21 ENCOUNTER — Encounter (HOSPITAL_COMMUNITY): Payer: Medicare HMO | Admitting: Vascular Surgery

## 2013-09-21 ENCOUNTER — Encounter (HOSPITAL_COMMUNITY): Payer: Self-pay | Admitting: *Deleted

## 2013-09-21 ENCOUNTER — Inpatient Hospital Stay (HOSPITAL_COMMUNITY): Payer: Medicare HMO

## 2013-09-21 ENCOUNTER — Inpatient Hospital Stay (HOSPITAL_COMMUNITY): Payer: Medicare HMO | Admitting: Anesthesiology

## 2013-09-21 ENCOUNTER — Encounter (HOSPITAL_COMMUNITY)
Admission: RE | Disposition: A | Discharge status: Home Health | Payer: Self-pay | Source: Ambulatory Visit | Attending: Orthopedic Surgery

## 2013-09-21 ENCOUNTER — Inpatient Hospital Stay (HOSPITAL_COMMUNITY)
Admission: RE | Admit: 2013-09-21 | Discharge: 2013-09-23 | DRG: 483 | Disposition: A | Discharge status: Home Health | Payer: Medicare HMO | Source: Ambulatory Visit | Attending: Orthopedic Surgery | Admitting: Orthopedic Surgery

## 2013-09-21 DIAGNOSIS — E785 Hyperlipidemia, unspecified: Secondary | ICD-10-CM | POA: Diagnosis present

## 2013-09-21 DIAGNOSIS — E119 Type 2 diabetes mellitus without complications: Secondary | ICD-10-CM | POA: Diagnosis present

## 2013-09-21 DIAGNOSIS — G4730 Sleep apnea, unspecified: Secondary | ICD-10-CM | POA: Diagnosis present

## 2013-09-21 DIAGNOSIS — Z87891 Personal history of nicotine dependence: Secondary | ICD-10-CM

## 2013-09-21 DIAGNOSIS — M109 Gout, unspecified: Secondary | ICD-10-CM | POA: Diagnosis present

## 2013-09-21 DIAGNOSIS — M7580 Other shoulder lesions, unspecified shoulder: Secondary | ICD-10-CM

## 2013-09-21 DIAGNOSIS — M25819 Other specified joint disorders, unspecified shoulder: Secondary | ICD-10-CM | POA: Diagnosis present

## 2013-09-21 DIAGNOSIS — M19019 Primary osteoarthritis, unspecified shoulder: Principal | ICD-10-CM | POA: Diagnosis present

## 2013-09-21 DIAGNOSIS — G4700 Insomnia, unspecified: Secondary | ICD-10-CM | POA: Diagnosis present

## 2013-09-21 DIAGNOSIS — I10 Essential (primary) hypertension: Secondary | ICD-10-CM | POA: Diagnosis present

## 2013-09-21 DIAGNOSIS — H409 Unspecified glaucoma: Secondary | ICD-10-CM | POA: Diagnosis present

## 2013-09-21 DIAGNOSIS — Z79899 Other long term (current) drug therapy: Secondary | ICD-10-CM

## 2013-09-21 HISTORY — PX: SHOULDER INJECTION: SHX5048

## 2013-09-21 HISTORY — PX: REVERSE SHOULDER ARTHROPLASTY: SHX5054

## 2013-09-21 LAB — GLUCOSE, CAPILLARY
Glucose-Capillary: 102 mg/dL — ABNORMAL HIGH (ref 70–99)
Glucose-Capillary: 119 mg/dL — ABNORMAL HIGH (ref 70–99)
Glucose-Capillary: 140 mg/dL — ABNORMAL HIGH (ref 70–99)
Glucose-Capillary: 181 mg/dL — ABNORMAL HIGH (ref 70–99)

## 2013-09-21 SURGERY — ARTHROPLASTY, SHOULDER, TOTAL, REVERSE
Anesthesia: Regional | Site: Shoulder | Laterality: Right

## 2013-09-21 MED ORDER — METFORMIN HCL 500 MG PO TABS
1000.0000 mg | ORAL_TABLET | Freq: Two times a day (BID) | ORAL | Status: DC
  Administered 2013-09-21 – 2013-09-23 (×4): 1000 mg via ORAL
  Filled 2013-09-21 (×5): qty 2

## 2013-09-21 MED ORDER — TRIAMCINOLONE ACETONIDE 40 MG/ML IJ SUSP
INTRAMUSCULAR | Status: DC | PRN
  Administered 2013-09-21: 40 mg via INTRAMUSCULAR

## 2013-09-21 MED ORDER — ROCURONIUM BROMIDE 100 MG/10ML IV SOLN
INTRAVENOUS | Status: DC | PRN
  Administered 2013-09-21: 50 mg via INTRAVENOUS

## 2013-09-21 MED ORDER — DORZOLAMIDE HCL 2 % OP SOLN
1.0000 [drp] | Freq: Three times a day (TID) | OPHTHALMIC | Status: DC
  Administered 2013-09-21 – 2013-09-22 (×4): 1 [drp] via OPHTHALMIC
  Filled 2013-09-21: qty 10

## 2013-09-21 MED ORDER — LACTATED RINGERS IV SOLN
INTRAVENOUS | Status: DC
  Administered 2013-09-21: 11:00:00 via INTRAVENOUS

## 2013-09-21 MED ORDER — FENTANYL CITRATE 0.05 MG/ML IJ SOLN
INTRAMUSCULAR | Status: AC
  Filled 2013-09-21: qty 5

## 2013-09-21 MED ORDER — ONDANSETRON HCL 4 MG/2ML IJ SOLN: 4.0000 mg | Freq: Four times a day (QID) | INTRAMUSCULAR | Status: DC | PRN

## 2013-09-21 MED ORDER — OXYCODONE HCL 5 MG PO TABS
5.0000 mg | ORAL_TABLET | ORAL | Status: DC | PRN
  Administered 2013-09-21: 5 mg via ORAL
  Filled 2013-09-21: qty 1

## 2013-09-21 MED ORDER — ARTIFICIAL TEARS OP OINT
TOPICAL_OINTMENT | OPHTHALMIC | Status: DC | PRN
  Administered 2013-09-21: 1 via OPHTHALMIC

## 2013-09-21 MED ORDER — ONDANSETRON HCL 4 MG PO TABS: 4.0000 mg | ORAL_TABLET | Freq: Four times a day (QID) | ORAL | Status: DC | PRN

## 2013-09-21 MED ORDER — AMLODIPINE BESYLATE 5 MG PO TABS
5.0000 mg | ORAL_TABLET | Freq: Every day | ORAL | Status: DC
  Administered 2013-09-22 – 2013-09-23 (×2): 5 mg via ORAL
  Filled 2013-09-21 (×2): qty 1

## 2013-09-21 MED ORDER — HYDROMORPHONE HCL PF 1 MG/ML IJ SOLN
INTRAMUSCULAR | Status: AC
  Filled 2013-09-21: qty 1

## 2013-09-21 MED ORDER — METOCLOPRAMIDE HCL 10 MG PO TABS: 5.0000 mg | ORAL_TABLET | Freq: Three times a day (TID) | ORAL | Status: DC | PRN

## 2013-09-21 MED ORDER — LACTATED RINGERS IV SOLN
INTRAVENOUS | Status: DC | PRN
  Administered 2013-09-21 (×2): via INTRAVENOUS

## 2013-09-21 MED ORDER — INSULIN ASPART 100 UNIT/ML ~~LOC~~ SOLN: 0.0000 [IU] | Freq: Every day | SUBCUTANEOUS | Status: DC

## 2013-09-21 MED ORDER — HYDROMORPHONE HCL PF 1 MG/ML IJ SOLN
0.5000 mg | INTRAMUSCULAR | Status: DC | PRN
  Administered 2013-09-21 – 2013-09-22 (×3): 1 mg via INTRAVENOUS
  Filled 2013-09-21 (×2): qty 1

## 2013-09-21 MED ORDER — SODIUM CHLORIDE 0.9 % IR SOLN
Status: DC | PRN
  Administered 2013-09-21: 1000 mL

## 2013-09-21 MED ORDER — BUPIVACAINE-EPINEPHRINE 0.25% -1:200000 IJ SOLN
INTRAMUSCULAR | Status: DC | PRN
  Administered 2013-09-21: 4 mL
  Administered 2013-09-21: 7 mL

## 2013-09-21 MED ORDER — ONDANSETRON HCL 4 MG/2ML IJ SOLN
INTRAMUSCULAR | Status: AC
  Filled 2013-09-21: qty 2

## 2013-09-21 MED ORDER — LIDOCAINE HCL (CARDIAC) 20 MG/ML IV SOLN
INTRAVENOUS | Status: AC
  Filled 2013-09-21: qty 5

## 2013-09-21 MED ORDER — FENTANYL CITRATE 0.05 MG/ML IJ SOLN
100.0000 ug | Freq: Once | INTRAMUSCULAR | Status: AC
  Administered 2013-09-21: 100 ug via INTRAVENOUS

## 2013-09-21 MED ORDER — ACETAMINOPHEN 650 MG RE SUPP: 650.0000 mg | Freq: Four times a day (QID) | RECTAL | Status: DC | PRN

## 2013-09-21 MED ORDER — LIDOCAINE HCL (CARDIAC) 20 MG/ML IV SOLN
INTRAVENOUS | Status: DC | PRN
  Administered 2013-09-21: 30 mg via INTRAVENOUS

## 2013-09-21 MED ORDER — TAMSULOSIN HCL 0.4 MG PO CAPS
0.4000 mg | ORAL_CAPSULE | Freq: Every day | ORAL | Status: DC
  Administered 2013-09-22 – 2013-09-23 (×2): 0.4 mg via ORAL
  Filled 2013-09-21 (×3): qty 1

## 2013-09-21 MED ORDER — METOCLOPRAMIDE HCL 5 MG/ML IJ SOLN: 5.0000 mg | Freq: Three times a day (TID) | INTRAMUSCULAR | Status: DC | PRN

## 2013-09-21 MED ORDER — SIMVASTATIN 20 MG PO TABS
20.0000 mg | ORAL_TABLET | Freq: Every day | ORAL | Status: DC
  Administered 2013-09-21 – 2013-09-22 (×2): 20 mg via ORAL
  Filled 2013-09-21 (×3): qty 1

## 2013-09-21 MED ORDER — NEOSTIGMINE METHYLSULFATE 1 MG/ML IJ SOLN
INTRAMUSCULAR | Status: AC
  Filled 2013-09-21: qty 10

## 2013-09-21 MED ORDER — SIMVASTATIN 20 MG PO TABS: 20.0000 mg | ORAL_TABLET | Freq: Every day | ORAL | Status: DC

## 2013-09-21 MED ORDER — INSULIN ASPART 100 UNIT/ML ~~LOC~~ SOLN
0.0000 [IU] | Freq: Three times a day (TID) | SUBCUTANEOUS | Status: DC
  Administered 2013-09-22 – 2013-09-23 (×2): 2 [IU] via SUBCUTANEOUS

## 2013-09-21 MED ORDER — HYDROMORPHONE HCL PF 1 MG/ML IJ SOLN
0.2500 mg | INTRAMUSCULAR | Status: DC | PRN
Start: 2013-09-21 — End: 2013-09-21

## 2013-09-21 MED ORDER — SODIUM CHLORIDE 0.9 % IV SOLN: INTRAVENOUS | Status: DC

## 2013-09-21 MED ORDER — CALCIUM CHLORIDE 10 % IV SOLN
INTRAVENOUS | Status: DC | PRN
  Administered 2013-09-21 (×2): 300 mg via INTRAVENOUS

## 2013-09-21 MED ORDER — GLYCOPYRROLATE 0.2 MG/ML IJ SOLN
INTRAMUSCULAR | Status: DC | PRN
  Administered 2013-09-21: 0.4 mg via INTRAVENOUS

## 2013-09-21 MED ORDER — CALCIUM CHLORIDE 10 % IV SOLN
INTRAVENOUS | Status: AC
  Filled 2013-09-21: qty 10

## 2013-09-21 MED ORDER — PROPOFOL 10 MG/ML IV BOLUS
INTRAVENOUS | Status: AC
  Filled 2013-09-21: qty 20

## 2013-09-21 MED ORDER — PHENYLEPHRINE HCL 10 MG/ML IJ SOLN
10.0000 mg | INTRAVENOUS | Status: DC | PRN
  Administered 2013-09-21: 20 ug/min via INTRAVENOUS

## 2013-09-21 MED ORDER — BUPIVACAINE-EPINEPHRINE PF 0.5-1:200000 % IJ SOLN
INTRAMUSCULAR | Status: DC | PRN
  Administered 2013-09-21: 30 mL via PERINEURAL

## 2013-09-21 MED ORDER — GLYCOPYRROLATE 0.2 MG/ML IJ SOLN
INTRAMUSCULAR | Status: AC
  Filled 2013-09-21: qty 2

## 2013-09-21 MED ORDER — KETOCONAZOLE 2 % EX CREA
1.0000 "application " | TOPICAL_CREAM | Freq: Two times a day (BID) | CUTANEOUS | Status: DC | PRN
  Filled 2013-09-21: qty 15

## 2013-09-21 MED ORDER — MIDAZOLAM HCL 2 MG/2ML IJ SOLN
INTRAMUSCULAR | Status: AC
  Filled 2013-09-21: qty 2

## 2013-09-21 MED ORDER — GABAPENTIN 300 MG PO CAPS
300.0000 mg | ORAL_CAPSULE | Freq: Three times a day (TID) | ORAL | Status: DC
  Administered 2013-09-21 – 2013-09-23 (×5): 300 mg via ORAL
  Filled 2013-09-21 (×7): qty 1

## 2013-09-21 MED ORDER — ACETAMINOPHEN 325 MG PO TABS
650.0000 mg | ORAL_TABLET | Freq: Four times a day (QID) | ORAL | Status: DC | PRN
  Administered 2013-09-21: 650 mg via ORAL
  Filled 2013-09-21: qty 2

## 2013-09-21 MED ORDER — LOSARTAN POTASSIUM 25 MG PO TABS
25.0000 mg | ORAL_TABLET | Freq: Every day | ORAL | Status: DC
  Administered 2013-09-21 – 2013-09-23 (×3): 25 mg via ORAL
  Filled 2013-09-21 (×3): qty 1

## 2013-09-21 MED ORDER — PROPOFOL 10 MG/ML IV BOLUS
INTRAVENOUS | Status: DC | PRN
  Administered 2013-09-21: 120 mg via INTRAVENOUS

## 2013-09-21 MED ORDER — MENTHOL 3 MG MT LOZG: 1.0000 | LOZENGE | OROMUCOSAL | Status: DC | PRN

## 2013-09-21 MED ORDER — ALLOPURINOL 300 MG PO TABS
300.0000 mg | ORAL_TABLET | Freq: Every day | ORAL | Status: DC
  Administered 2013-09-22 – 2013-09-23 (×2): 300 mg via ORAL
  Filled 2013-09-21 (×2): qty 1

## 2013-09-21 MED ORDER — FENTANYL CITRATE 0.05 MG/ML IJ SOLN
INTRAMUSCULAR | Status: AC
  Filled 2013-09-21: qty 2

## 2013-09-21 MED ORDER — CEFAZOLIN SODIUM-DEXTROSE 2-3 GM-% IV SOLR
INTRAVENOUS | Status: AC
  Administered 2013-09-21: 2 g via INTRAVENOUS
  Filled 2013-09-21: qty 50

## 2013-09-21 MED ORDER — PHENOL 1.4 % MT LIQD: 1.0000 | OROMUCOSAL | Status: DC | PRN

## 2013-09-21 MED ORDER — NEOSTIGMINE METHYLSULFATE 1 MG/ML IJ SOLN
INTRAMUSCULAR | Status: DC | PRN
  Administered 2013-09-21: 3 mg via INTRAVENOUS

## 2013-09-21 MED ORDER — INSULIN ASPART 100 UNIT/ML ~~LOC~~ SOLN
4.0000 [IU] | Freq: Three times a day (TID) | SUBCUTANEOUS | Status: DC
  Administered 2013-09-23: 4 [IU] via SUBCUTANEOUS

## 2013-09-21 MED ORDER — CEFAZOLIN SODIUM-DEXTROSE 2-3 GM-% IV SOLR
2.0000 g | Freq: Four times a day (QID) | INTRAVENOUS | Status: AC
Start: 2013-09-21 — End: 2013-09-22
  Administered 2013-09-21 – 2013-09-22 (×3): 2 g via INTRAVENOUS
  Filled 2013-09-21 (×3): qty 50

## 2013-09-21 MED ORDER — BUPIVACAINE-EPINEPHRINE (PF) 0.25% -1:200000 IJ SOLN
INTRAMUSCULAR | Status: AC
  Filled 2013-09-21: qty 30

## 2013-09-21 MED ORDER — ONDANSETRON HCL 4 MG/2ML IJ SOLN
INTRAMUSCULAR | Status: DC | PRN
  Administered 2013-09-21: 4 mg via INTRAVENOUS

## 2013-09-21 SURGICAL SUPPLY — 67 items
BIT DRILL 170X2.5X (BIT) IMPLANT
BIT DRL 170X2.5X (BIT)
BLADE SAG 18X100X1.27 (BLADE) ×3 IMPLANT
CAPT SHOULD DELTAXTEND CEM MOD ×1 IMPLANT
COVER SURGICAL LIGHT HANDLE (MISCELLANEOUS) ×3 IMPLANT
DRAPE INCISE IOBAN 66X45 STRL (DRAPES) ×9 IMPLANT
DRAPE U-SHAPE 47X51 STRL (DRAPES) ×3 IMPLANT
DRAPE X-RAY CASS 24X20 (DRAPES) IMPLANT
DRILL 2.5 (BIT)
DRSG ADAPTIC 3X8 NADH LF (GAUZE/BANDAGES/DRESSINGS) ×3 IMPLANT
DRSG PAD ABDOMINAL 8X10 ST (GAUZE/BANDAGES/DRESSINGS) ×4 IMPLANT
DURAPREP 26ML APPLICATOR (WOUND CARE) ×3 IMPLANT
ELECT BLADE 4.0 EZ CLEAN MEGAD (MISCELLANEOUS) ×3
ELECT NDL TIP 2.8 STRL (NEEDLE) ×2 IMPLANT
ELECT NEEDLE TIP 2.8 STRL (NEEDLE) ×3 IMPLANT
ELECT REM PT RETURN 9FT ADLT (ELECTROSURGICAL) ×3
ELECTRODE BLDE 4.0 EZ CLN MEGD (MISCELLANEOUS) ×2 IMPLANT
ELECTRODE REM PT RTRN 9FT ADLT (ELECTROSURGICAL) ×2 IMPLANT
GLOVE BIOGEL PI IND STRL 6.5 (GLOVE) IMPLANT
GLOVE BIOGEL PI INDICATOR 6.5 (GLOVE) ×1
GLOVE BIOGEL PI ORTHO PRO 7.5 (GLOVE) ×1
GLOVE BIOGEL PI ORTHO PRO SZ8 (GLOVE) ×1
GLOVE ECLIPSE 6.5 STRL STRAW (GLOVE) ×1 IMPLANT
GLOVE ORTHO TXT STRL SZ7.5 (GLOVE) ×3 IMPLANT
GLOVE PI ORTHO PRO STRL 7.5 (GLOVE) ×2 IMPLANT
GLOVE PI ORTHO PRO STRL SZ8 (GLOVE) ×2 IMPLANT
GLOVE SURG ORTHO 8.5 STRL (GLOVE) ×3 IMPLANT
GOWN STRL REUS W/ TWL LRG LVL3 (GOWN DISPOSABLE) ×2 IMPLANT
GOWN STRL REUS W/ TWL XL LVL3 (GOWN DISPOSABLE) ×4 IMPLANT
GOWN STRL REUS W/TWL LRG LVL3 (GOWN DISPOSABLE) ×3
GOWN STRL REUS W/TWL XL LVL3 (GOWN DISPOSABLE) ×6
HANDPIECE INTERPULSE COAX TIP (DISPOSABLE)
KIT BASIN OR (CUSTOM PROCEDURE TRAY) ×3 IMPLANT
KIT ROOM TURNOVER OR (KITS) ×3 IMPLANT
MANIFOLD NEPTUNE II (INSTRUMENTS) ×3 IMPLANT
NDL 1/2 CIR MAYO (NEEDLE) ×2 IMPLANT
NDL HYPO 25GX1X1/2 BEV (NEEDLE) ×2 IMPLANT
NEEDLE 1/2 CIR MAYO (NEEDLE) ×3 IMPLANT
NEEDLE HYPO 25GX1X1/2 BEV (NEEDLE) ×3 IMPLANT
NS IRRIG 1000ML POUR BTL (IV SOLUTION) ×3 IMPLANT
PACK SHOULDER (CUSTOM PROCEDURE TRAY) ×3 IMPLANT
PAD ARMBOARD 7.5X6 YLW CONV (MISCELLANEOUS) ×6 IMPLANT
PIN GUIDE 1.2 (PIN) IMPLANT
PIN GUIDE GLENOPHERE 1.5MX300M (PIN) IMPLANT
PIN METAGLENE 2.5 (PIN) IMPLANT
SET HNDPC FAN SPRY TIP SCT (DISPOSABLE) IMPLANT
SLING ARM FOAM STRAP LRG (SOFTGOODS) ×1 IMPLANT
SLING ARM LRG ADULT FOAM STRAP (SOFTGOODS) IMPLANT
SLING ARM MED ADULT FOAM STRAP (SOFTGOODS) IMPLANT
SPONGE GAUZE 4X4 12PLY (GAUZE/BANDAGES/DRESSINGS) ×3 IMPLANT
SPONGE LAP 18X18 X RAY DECT (DISPOSABLE) ×3 IMPLANT
SPONGE LAP 4X18 X RAY DECT (DISPOSABLE) ×3 IMPLANT
STRIP CLOSURE SKIN 1/2X4 (GAUZE/BANDAGES/DRESSINGS) ×3 IMPLANT
SUCTION FRAZIER TIP 10 FR DISP (SUCTIONS) ×3 IMPLANT
SUT FIBERWIRE #2 38 T-5 BLUE (SUTURE) ×9
SUT MNCRL AB 4-0 PS2 18 (SUTURE) ×3 IMPLANT
SUT VIC AB 2-0 CT1 27 (SUTURE) ×6
SUT VIC AB 2-0 CT1 TAPERPNT 27 (SUTURE) ×2 IMPLANT
SUT VICRYL 0 CT 1 36IN (SUTURE) ×4 IMPLANT
SUTURE FIBERWR #2 38 T-5 BLUE (SUTURE) ×4 IMPLANT
SYR CONTROL 10ML LL (SYRINGE) ×3 IMPLANT
TOWEL OR 17X24 6PK STRL BLUE (TOWEL DISPOSABLE) ×3 IMPLANT
TOWEL OR 17X26 10 PK STRL BLUE (TOWEL DISPOSABLE) ×3 IMPLANT
TOWER CARTRIDGE SMART MIX (DISPOSABLE) IMPLANT
TRAY FOLEY CATH 16FRSI W/METER (SET/KITS/TRAYS/PACK) ×3 IMPLANT
WATER STERILE IRR 1000ML POUR (IV SOLUTION) ×3 IMPLANT
YANKAUER SUCT BULB TIP NO VENT (SUCTIONS) IMPLANT

## 2013-09-21 NOTE — Transfer of Care (Signed)
Immediate Anesthesia Transfer of Care Note  Patient: [name]  Procedure(s) Performed: Procedure(s): RIGHT  SHOULDER ARTHROPLASTY REVERSE  (Right) SHOULDER INJECTION (Left)  Patient Location: PACU  Anesthesia Type:General  Level of Consciousness: awake, oriented and patient cooperative  Airway & Oxygen Therapy: Patient Spontanous Breathing and Patient connected to face mask oxygen  Post-op Assessment: Report given to PACU RN and Post -op Vital signs reviewed and stable  Post vital signs: Reviewed  Complications: No apparent anesthesia complications

## 2013-09-21 NOTE — Interval H&P Note (Signed)
History and Physical Interval Note:  09/21/2013 12:45 PM  [name]  has presented today for surgery, with the diagnosis of RIGHT SHOULDER ROTATOR CUFF INSUFFICENT  The various methods of treatment have been discussed with the patient and family. After consideration of risks, benefits and other options for treatment, the patient has consented to  Procedure(s): RIGHT  SHOULDER ARTHROPLASTY REVERSE  (Right) as a surgical intervention .  The patient's history has been reviewed, patient examined, no change in status, stable for surgery.  I have reviewed the patient's chart and labs.  Questions were answered to the patient's satisfaction.     [name]

## 2013-09-21 NOTE — Brief Op Note (Signed)
09/21/2013  3:48 PM  PATIENT:  [name]  80 y.o. male  PRE-OPERATIVE DIAGNOSIS:  RIGHT SHOULDER ROTATOR CUFF DEFICIENT, OA   LEFT SHOULDER PAIN  POST-OPERATIVE DIAGNOSIS:  RIGHT SHOULDER ROTATOR CUFF DEFICIENT, OA  LEFT SHOULDER PAIN  PROCEDURE:  Procedure(s): RIGHT  SHOULDER ARTHROPLASTY REVERSE  (Right) SHOULDER INJECTION (Left)  SURGEON:  Surgeon(s) and Role:    * [name], MD - Primary  PHYSICIAN ASSISTANT:   ASSISTANTS: Thea Gist PA-C   ANESTHESIA:   regional and general  EBL:  Total I/O In: 1500 [I.V.:1500] Out: -   BLOOD ADMINISTERED:none  DRAINS: none   LOCAL MEDICATIONS USED:  MARCAINE     SPECIMEN:  No Specimen  DISPOSITION OF SPECIMEN:  N/A  COUNTS:  YES  TOURNIQUET:  * No tourniquets in log *  DICTATION: .Other Dictation: Dictation Number [id number]  PLAN OF CARE: Admit to inpatient   PATIENT DISPOSITION:  PACU - hemodynamically stable.   Delay start of Pharmacological VTE agent (>24hrs) due to surgical blood loss or risk of bleeding: no

## 2013-09-21 NOTE — Anesthesia Preprocedure Evaluation (Addendum)
Anesthesia Evaluation  Patient identified by MRN, date of birth, ID band Patient awake    Reviewed: Allergy & Precautions, H&P , NPO status , Patient's Chart, lab work & pertinent test results  History of Anesthesia Complications Negative for: history of anesthetic complications  Airway Mallampati: III TM Distance: >3 FB Neck ROM: Full    Dental no notable dental hx. (+) Edentulous Upper, Dental Advisory Given   Pulmonary sleep apnea , former smoker,  breath sounds clear to auscultation  Pulmonary exam normal       Cardiovascular hypertension, On Medications Rhythm:Regular Rate:Normal     Neuro/Psych negative neurological ROS  negative psych ROS   GI/Hepatic negative GI ROS, Neg liver ROS, PUD,   Endo/Other  diabetes, Well Controlled, Type 2, Oral Hypoglycemic AgentsCBG 102 @ 1045  Renal/GU negative Renal ROS  negative genitourinary   Musculoskeletal   Abdominal   Peds  Hematology negative hematology ROS (+)   Anesthesia Other Findings   Reproductive/Obstetrics negative OB ROS                         Anesthesia Physical Anesthesia Plan  ASA: III  Anesthesia Plan: General and Regional   Post-op Pain Management:    Induction: Intravenous  Airway Management Planned: Oral ETT  Additional Equipment:   Intra-op Plan:   Post-operative Plan: Extubation in OR  Informed Consent: I have reviewed the patients History and Physical, chart, labs and discussed the procedure including the risks, benefits and alternatives for the proposed anesthesia with the patient or authorized representative who has indicated his/her understanding and acceptance.   Dental advisory given  Plan Discussed with: CRNA  Anesthesia Plan Comments:         Anesthesia Quick Evaluation

## 2013-09-21 NOTE — Anesthesia Procedure Notes (Signed)
Anesthesia Regional Block:  Interscalene brachial plexus block  Pre-Anesthetic Checklist: ,, timeout performed, Correct Patient, Correct Site, Correct Laterality, Correct Procedure, Correct Position, site marked, Risks and benefits discussed, pre-op evaluation,  At surgeon's request and post-op pain management  Laterality: Right  Prep: Maximum Sterile Barrier Precautions used and chloraprep       Needles:  Injection technique: Single-shot  Needle Type: Echogenic Stimulator Needle     Needle Length: 5cm 5 cm Needle Gauge: 22 and 22 G    Additional Needles:  Procedures: ultrasound guided (picture in chart) and nerve stimulator Interscalene brachial plexus block  Nerve Stimulator or Paresthesia:  Response: Biceps response,   Additional Responses:   Narrative:  Start time: 09/21/2013 11:05 AM End time: 09/21/2013 11:15 AM Injection made incrementally with aspirations every 5 mL. Anesthesiologist: Sampson Goon, MD  Additional Notes: 2% Lidocaine skin wheel.

## 2013-09-22 ENCOUNTER — Encounter (HOSPITAL_COMMUNITY): Payer: Self-pay | Admitting: General Practice

## 2013-09-22 LAB — GLUCOSE, CAPILLARY
Glucose-Capillary: 137 mg/dL — ABNORMAL HIGH (ref 70–99)
Glucose-Capillary: 126 mg/dL — ABNORMAL HIGH (ref 70–99)
Glucose-Capillary: 143 mg/dL — ABNORMAL HIGH (ref 70–99)
Glucose-Capillary: 175 mg/dL — ABNORMAL HIGH (ref 70–99)

## 2013-09-22 LAB — BASIC METABOLIC PANEL
BUN: 14 mg/dL (ref 6–23)
CO2: 26 mEq/L (ref 19–32)
Calcium: 9.1 mg/dL (ref 8.4–10.5)
Chloride: 104 mEq/L (ref 96–112)
Creatinine, Ser: 0.98 mg/dL (ref 0.50–1.35)
GFR calc Af Amer: 88 mL/min — ABNORMAL LOW (ref >90)
GFR calc non Af Amer: 76 mL/min — ABNORMAL LOW (ref >90)
Glucose, Bld: 132 mg/dL — ABNORMAL HIGH (ref 70–99)
Potassium: 4.4 mEq/L (ref 3.7–5.3)
Sodium: 141 mEq/L (ref 137–147)

## 2013-09-22 LAB — HEMOGLOBIN AND HEMATOCRIT, BLOOD
HCT: 38.4 % — ABNORMAL LOW (ref 39.0–52.0)
Hemoglobin: 12.5 g/dL — ABNORMAL LOW (ref 13.0–17.0)

## 2013-09-22 MED ORDER — OXYCODONE-ACETAMINOPHEN 5-325 MG PO TABS: 1.0000 | ORAL_TABLET | ORAL | Status: DC | PRN

## 2013-09-22 MED ORDER — OXYCODONE HCL 5 MG PO TABS
5.0000 mg | ORAL_TABLET | ORAL | Status: DC | PRN
  Administered 2013-09-22 – 2013-09-23 (×4): 10 mg via ORAL
  Filled 2013-09-22 (×4): qty 2

## 2013-09-22 NOTE — Progress Notes (Signed)
Utilization review completed.  

## 2013-09-22 NOTE — Discharge Instructions (Signed)
Ice shoulder constantly. Do exercises at least 4 times per day for 10 minutes. Ok to use the shoulder for light activities of daily living  No heavy lift push or pull!!!  Sling while up, ok to remove while seated or reclining.  Prop pillow behind your right elbow.  Keep the incision clean and dry for one week, then ok to get wet in the shower.  Follow up with Dr Ranell Patrick in two weeks in the office [phone]

## 2013-09-22 NOTE — Op Note (Signed)
NAMWaynard Reeds:  [place], [place]             ACCOUNT NO.:  [id number]  MEDICAL RECORD NO.:  [id number]  LOCATION:  5N02C                        FACILITY:  [facility]  PHYSICIAN:  Almedia BallsSteven R. Ranell PatrickNorris, M.D. DATE OF BIRTH:  06/30/1930  DATE OF PROCEDURE:  09/21/2013 DATE OF DISCHARGE:                              OPERATIVE REPORT   PREOPERATIVE DIAGNOSES: 1. Right shoulder rotator cuff insufficiency shoulder with away. 2. Left shoulder pain.  PROCEDURE PERFORMED:  Left shoulder subacromial corticosteroid injection and right shoulder reverse total shoulder arthroplasty using DePuy Delta Xtend prosthesis.  ATTENDING SURGEON:  Almedia BallsSteven R. Ranell PatrickNorris, MD.  ASSISTANT:  Konrad Felixhomas Brad Dixon, [place], who was scrubbed the entire procedure and necessary for satisfactory completion of surgery.  General anesthesia was used plus interscalene block.  ESTIMATED BLOOD LOSS:  Minimal.  FLUID REPLACED:  1200 mL crystalloid.  INSTRUMENT COUNTS:  Correct.  There were no complications.  Perioperative antibiotics were given.  INDICATIONS:  The patient is an 80 year old male who is suffering from worsening right shoulder pain and loss of function.  The patient has had progressive pain despite conservative management.  The patient presents, having discussed options for management including conservative management versus surgical treatment.  The patient desires surgery. Risks and benefits of surgery discussed.  Medical clearance was obtained and informed consent was obtained.  The patient also complained of left shoulder pain.  Felt likely secondary to impingement syndrome.  We discussed options he would like to have a subacromial cortisone injection, left shoulder while he is asleep for his right shoulder surgery and again consent was obtained.  General anesthesia was used plus interscalene block.  Estimated blood loss minimal.  Fluid replaced 1200 mL crystalloids.  Instrument counts were correct.  There were  no complications.  Perioperative antibiotics were given.  The patient is an 80 year old male with worsening right shoulder pain secondary to rotator cuff insufficiency.  The patient has had a thorough workup including x-rays and MRI scan indicating torn and retracted supraspinatus and subscapularis muscles.  The patient has loss of fixed fulcrum mechanics and his exam of his left shoulder shows obvious signs of impingement syndrome but an intact cuff.  Having failed all measures of conservative management, the patient desires surgery and informed consent was obtained.  DESCRIPTION OF PROCEDURE:  After adequate level of anesthesia was achieved, the patient was positioned in the modified beach-chair position.  Left shoulder was injected under sterile technique and subacromial space, 1 mL of Kenalog and 4 mL of 0.25% Marcaine with epinephrine.  The right shoulder was then sterilely prepped and draped in usual manner.  Time-out had been called before the first injection and also before the right shoulder surgery.  Sterile prep and drape of the shoulder performed.  Deltopectoral exposure was created with a #10 blade scalpel started at the coracoid process extending down to the anterior humerus, dissection down through subcutaneous tissues using Bovie electrocautery.  We identified the cephalic vein and took it laterally with the deltoid.  The pectoralis was taken medially.  We then identified the conjoined tendon and retracted that medially.  We released the subscapularis which was somewhat scarred the coracoid and I peeled that off subperiosteally.  We placed #2  FiberWire suture in a modified W stitch technique for repair of the subscap at the end.  We felt like there was enough at the inferior subscap.  It still looks reasonable on MRI that may be worth repairing.  We released the remaining inferior capsule protecting the axillary nerve.  We also released the scarred and stiff what was  left of the supraspinatus, infraspinatus, and even the teres minor.  The back was extremely stiff as well and this did not move well.  We released that.  We then entered the proximal humerus with the reamers and reamed up to a size 14 canal size.  We then prepared the metaphyseal portion with a size 2 right, reaming off the intramedullary guide.  We then placed our trial 14 body with a 2 right metaphyseal component, set on 0 degrees and this was placed in 10 degrees of retroversion.  Once that was in place, we retracted the humerus posteriorly.  We did a 360-degree capsular removal.  We protected the axillary nerve.  We were careful to free up the subscapularis to make sure that would balance.  We then found our center point with our guide pin.  We then reamed over the center point for the metaglene and drilled out the central peg hole and then impacted the metaglene in position.  We obtained excellent purchase inferiorly and superiorly with 36 inferiorly, 36 up into the coracoid base, and then an 18 nonlocked posteriorly, did not get a screw anteriorly.  We had extremely stable metaglene.  We then placed our 42 standard glenoid sphere, screwed that in position.  We then trialed first with a +3, then a +6 poly insert.  We then removed our trial humeral components and then placed our hydroxyapatite coated press-fit stem 14 body and 2 right in 10 degrees of retroversion.  We impacted that in position with impaction grafting technique.  We placed sutures through drill holes for subscap repair.  We then trialed again with +6 and were happy of that and selected a real +6 poly.  We did a little more releasing around posteriorly of the rotator cuff as things were still tight with cross arm in internal rotation.  We were happy then with arm on the body tension and negative sulcus, negative shuck, negative gap with external rotation, and then repaired the subscap anatomically to the 2 drill holes,  and with FiberWire and then went ahead and thoroughly irrigated and then repaired deltopectoral interval with 0 Vicryl suture followed by 2-0 Vicryl subcutaneous closure and 4-0 running Monocryl for skin. Steri-Strips applied followed by sterile dressing.  The patient tolerated the surgery well.     Almedia Balls. Ranell Patrick, M.D.     SRN/MEDQ  D:  09/21/2013  T:  09/22/2013  Job:  [id number]

## 2013-09-22 NOTE — Progress Notes (Signed)
Orthopedics Progress Note  Subjective: I feel pretty good  Objective:  Filed Vitals:   09/22/13 0544  BP: 128/73  Pulse: 86  Temp: 98.6 F (37 C)  Resp: 18    General: Awake and alert  Musculoskeletal: right shoulder dressing CDI Neurovascularly intact, including axillary nerve   Lab Results  Component Value Date   WBC 4.6 09/17/2013   HGB 12.5* 09/22/2013   HCT 38.4* 09/22/2013   MCV 89.5 09/17/2013   PLT 190 09/17/2013       Component Value Date/Time   NA 141 09/22/2013 0500   K 4.4 09/22/2013 0500   CL 104 09/22/2013 0500   CO2 26 09/22/2013 0500   GLUCOSE 132* 09/22/2013 0500   BUN 14 09/22/2013 0500   CREATININE 0.98 09/22/2013 0500   CALCIUM 9.1 09/22/2013 0500   GFRNONAA 76* 09/22/2013 0500   GFRAA 88* 09/22/2013 0500    Lab Results  Component Value Date   INR 0.96 09/17/2013   INR 1.03 12/20/2012   INR 1.0 12/02/2006    Assessment/Plan: POD #1 s/p Procedure(s): RIGHT  SHOULDER ARTHROPLASTY REVERSE  SHOULDER INJECTION Patient looks pretty good this morning, if therapy goes well and they feel he is appropriate for D/C then we can go home later today. With home health OT, PT as recommended by our therapists  Almedia Balls. Ranell Patrick, MD 09/22/2013 7:33 AM

## 2013-09-22 NOTE — Evaluation (Signed)
Physical Therapy Evaluation Patient Details Name: [name] MRN: [id number] DOB: 09/20/1930 Today's Date: 09/22/2013   History of Present Illness  80 y/o male s/p Reverse Total shoulder arthroplasty on 09/21/13.  Clinical Impression  Pt mildly unsteady as he normally uses cane in his R UE, but is attempting to use cane in L UE.  Pt states that his grandson will be able to A him at home as needed.  Will continue to follow.      Follow Up Recommendations Home health PT;Supervision for mobility/OOB    Equipment Recommendations  None recommended by PT    Recommendations for Other Services       Precautions / Restrictions Precautions Precautions: Shoulder Shoulder Interventions: Shoulder sling/immobilizer;Off for dressing/bathing/exercises Precaution Booklet Issued: Yes (comment) Precaution Comments: Given by OT.   Required Braces or Orthoses: Sling Restrictions Weight Bearing Restrictions: No      Mobility  Bed Mobility Overal bed mobility: Needs Assistance Bed Mobility: Supine to Sit;Sit to Supine   Sidelying to sit: Min assist Supine to sit: Supervision;HOB elevated Sit to supine: Supervision;HOB elevated Sit to sidelying: Supervision General bed mobility comments: pt needs increased time, but demos good use of L UE and trunk to bring self up to sitting.    Transfers Overall transfer level: Needs assistance Equipment used: Straight cane Transfers: Sit to/from Stand Sit to Stand: Min guard Stand pivot transfers: Min assist       General transfer comment: cues for use of L UE and to control descent to sitting.    Ambulation/Gait Ambulation/Gait assistance: Min guard Ambulation Distance (Feet): 140 Feet Assistive device: Straight cane Gait Pattern/deviations: Step-through pattern;Decreased stride length   Gait velocity interpretation: Below normal speed for age/gender General Gait Details: pt moves slowly, but demos good use of cane.  pt with visual  deficits at baseline and having difficulty seeing door and doorway on room.  Needs extra cueing 2/2 visual deificts.    Stairs Stairs: Yes Stairs assistance: Min guard Stair Management: One rail Right;One rail Left;Step to pattern;Forwards Number of Stairs: 2 General stair comments: pt able to use L rail to ascend and R rail to descend.  pt demos good use of toes to feel edge of step.    Wheelchair Mobility    Modified Rankin (Stroke Patients Only)       Balance Overall balance assessment: Needs assistance Sitting-balance support: Feet supported Sitting balance-Leahy Scale: Good Sitting balance - Comments: Sitting EOB and then at sink during ADL's    Standing balance support: Single extremity supported Standing balance-Leahy Scale: Fair Standing balance comment: Hand held assist on left to be further assessed by PT                             Pertinent Vitals/Pain R shoulder 5/10.  Premedicated.      Home Living Family/patient expects to be discharged to:: Private residence Living Arrangements: Spouse/significant other   Type of Home: House Home Access: Stairs to enter Entrance Stairs-Rails: Right Entrance Stairs-Number of Steps: 7-8 Home Layout: One level Home Equipment: Walker - 4 wheels;Wheelchair - manual;Shower seat;Grab bars - tub/shower;Grab bars - toilet;Cane - single point      Prior Function Level of Independence: Independent with assistive device(s)         Comments: Pt reports independently walking in the house, uses 4 wheeled walker for distances.     Hand Dominance   Dominant Hand: Right    Extremity/Trunk  Assessment   Upper Extremity Assessment: Defer to OT evaluation RUE Deficits / Details: S/P Reverse total shoulder arthroplasty RUE: Unable to fully assess due to immobilization       Lower Extremity Assessment: Generalized weakness         Communication   Communication: No difficulties  Cognition Arousal/Alertness:  Awake/alert Behavior During Therapy: WFL for tasks assessed/performed Overall Cognitive Status: Within Functional Limits for tasks assessed                      General Comments      Exercises Shoulder Exercises Pendulum Exercise: Right;10 reps;Standing;PROM Shoulder Flexion: AROM;Right;5 reps;Seated Elbow Flexion: AROM;Right;5 reps Elbow Extension: AROM;Right;5 reps Wrist Flexion: AROM;Right;5 reps Wrist Extension: AROM;Right;5 reps Digit Composite Flexion: AROM;Right;5 reps Composite Extension: AROM;Right;5 reps Neck Lateral Flexion - Right: AROM;5 reps Neck Lateral Flexion - Left: AROM;5 reps Donning/doffing shirt without moving shoulder: Minimal assistance Method for sponge bathing under operated UE: Minimal assistance Donning/doffing sling/immobilizer: Minimal assistance Correct positioning of sling/immobilizer: Minimal assistance Pendulum exercises (written home exercise program): Moderate assistance ROM for elbow, wrist and digits of operated UE: Modified independent Sling wearing schedule (on at all times/off for ADL's): Modified independent Positioning of UE while sleeping: Minimal assistance      Assessment/Plan    PT Assessment Patient needs continued PT services  PT Diagnosis Difficulty walking;Generalized weakness   PT Problem List Decreased strength;Decreased activity tolerance;Decreased balance;Decreased mobility;Decreased knowledge of use of DME;Decreased knowledge of precautions;Pain  PT Treatment Interventions DME instruction;Gait training;Stair training;Functional mobility training;Therapeutic activities;Therapeutic exercise;Balance training;Patient/family education   PT Goals (Current goals can be found in the Care Plan section) Acute Rehab PT Goals Patient Stated Goal: Home with wife and grandson to assist PRN PT Goal Formulation: With patient Time For Goal Achievement: 09/29/13 Potential to Achieve Goals: Good    Frequency Min 3X/week    Barriers to discharge        Co-evaluation               End of Session Equipment Utilized During Treatment: Gait belt Activity Tolerance: Patient tolerated treatment well Patient left: in bed;with call bell/phone within reach Nurse Communication: Mobility status         Time: 1740-8144 PT Time Calculation (min): 28 min   Charges:   PT Evaluation $Initial PT Evaluation Tier I: 1 Procedure PT Treatments $Gait Training: 23-37 mins   PT G CodesSunny Schlein, PT [phone] 09/22/2013, 11:01 AM

## 2013-09-22 NOTE — Discharge Summary (Signed)
Physician Discharge Summary   Patient ID: [name] MRN: [id number] DOB/AGE: 1930/10/11 80 y.o.  Admit date: 09/21/2013 Discharge date: 09/22/2013  Admission Diagnoses:  Active Problems:   Osteoarthrosis, unspecified whether generalized or localized, shoulder region   Discharge Diagnoses:  Same   Surgeries: Procedure(s): RIGHT  SHOULDER ARTHROPLASTY REVERSE  SHOULDER INJECTION on 09/21/2013   Consultants: OT, PT  Discharged Condition: Stable  Hospital Course: Ricky CompanionSidney M Duffy is an 80 y.o. male who was admitted 09/21/2013 with a chief complaint of right shoulder pain and loss of function, and found to have a diagnosis of right shoulder rotator cuff tear arthropathy.  They were brought to the operating room on 09/21/2013 and underwent the above named procedures.    The patient had an uncomplicated hospital course and was stable for discharge.  Recent vital signs:  Filed Vitals:   09/22/13 0544  BP: 128/73  Pulse: 86  Temp: 98.6 F (37 C)  Resp: 18    Recent laboratory studies:  Results for orders placed during the hospital encounter of 09/21/13  GLUCOSE, CAPILLARY      Result Value Ref Range   Glucose-Capillary 102 (*) 70 - 99 mg/dL  GLUCOSE, CAPILLARY      Result Value Ref Range   Glucose-Capillary 119 (*) 70 - 99 mg/dL  GLUCOSE, CAPILLARY      Result Value Ref Range   Glucose-Capillary 140 (*) 70 - 99 mg/dL  HEMOGLOBIN AND HEMATOCRIT, BLOOD      Result Value Ref Range   Hemoglobin 12.5 (*) 13.0 - 17.0 g/dL   HCT 19.138.4 (*) 47.839.0 - 29.552.0 %  BASIC METABOLIC PANEL      Result Value Ref Range   Sodium 141  137 - 147 mEq/L   Potassium 4.4  3.7 - 5.3 mEq/L   Chloride 104  96 - 112 mEq/L   CO2 26  19 - 32 mEq/L   Glucose, Bld 132 (*) 70 - 99 mg/dL   BUN 14  6 - 23 mg/dL   Creatinine, Ser 6.210.98  0.50 - 1.35 mg/dL   Calcium 9.1  8.4 - 30.810.5 mg/dL   GFR calc non Af Amer 76 (*) >90 mL/min   GFR calc Af Amer 88 (*) >90 mL/min  GLUCOSE, CAPILLARY      Result  Value Ref Range   Glucose-Capillary 181 (*) 70 - 99 mg/dL  GLUCOSE, CAPILLARY      Result Value Ref Range   Glucose-Capillary 137 (*) 70 - 99 mg/dL    Discharge Medications:     Medication List    ASK your doctor about these medications       allopurinol 300 MG tablet  Commonly known as:  ZYLOPRIM  Take 300 mg by mouth daily.     amLODipine 5 MG tablet  Commonly known as:  NORVASC  Take 1 tablet (5 mg total) by mouth daily.     dorzolamide 2 % ophthalmic solution  Commonly known as:  TRUSOPT  Place 1 drop into the right eye 3 (three) times daily.     gabapentin 300 MG capsule  Commonly known as:  NEURONTIN  Take 1 capsule (300 mg total) by mouth 3 (three) times daily.     ketoconazole 2 % cream  Commonly known as:  NIZORAL  Apply 1 application topically 2 (two) times daily as needed for irritation.     losartan 25 MG tablet  Commonly known as:  COZAAR  Take 1 tablet (25 mg total) by mouth daily.  metFORMIN 1000 MG tablet  Commonly known as:  GLUCOPHAGE  Take 1,000 mg by mouth 2 (two) times daily with a meal.     oxyCODONE 5 MG immediate release tablet  Commonly known as:  Oxy IR/ROXICODONE  Take 5 mg by mouth every 4 (four) hours as needed for severe pain.     pravastatin 40 MG tablet  Commonly known as:  PRAVACHOL  Take 40 mg by mouth daily.     SIMBRINZA 1-0.2 % Susp  Generic drug:  Brinzolamide-Brimonidine  Place 1 drop into the left eye 3 (three) times daily.     SYSTANE OP  Place 1 drop into the left eye 3 (three) times daily.     tamsulosin 0.4 MG Caps capsule  Commonly known as:  FLOMAX  Take 0.4 mg by mouth daily.        Diagnostic Studies: Dg Chest 2 View  09/17/2013   CLINICAL DATA:  Pre-admission for shoulder surgery  EXAM: CHEST  2 VIEW  COMPARISON:  12/24/2012  FINDINGS: Cardiomediastinal silhouette is stable. There is linear atelectasis or scarring right base. Spinal stimulation wires again noted. No acute infiltrate or pleural  effusion. No pulmonary edema. Stable elevation of the right hemidiaphragm.  IMPRESSION: No active cardiopulmonary disease.   Electronically Signed   By: Natasha Mead M.D.   On: 09/17/2013 14:53   Dg Shoulder Right Port  09/21/2013   CLINICAL DATA:  Rotator cuff deficiency.  Osteoarthritis.  EXAM: PORTABLE RIGHT SHOULDER - 2+ VIEW  COMPARISON:  DG CHEST 2 VIEW dated 09/17/2013; MR SHOULDER*R* W/O CM dated 04/27/2013  FINDINGS: Patient is status post right shoulder reverse arthroplasty.  IMPRESSION: Satisfactory position and alignment.   Electronically Signed   By: Davonna Belling M.D.   On: 09/21/2013 17:37    Disposition: home       Future Appointments Provider Department Dept Phone   05/03/2014 2:15 PM Sherrie George, MD [facility] [phone]      Follow-up Information   Follow up with Donovan Gatchel,STEVEN R, MD. Call in 2 weeks. [phone])    Specialty:  Orthopedic Surgery   Contact information:   [address] [place] [address] [phone]        Signed: [name] 09/22/2013, 7:38 AM

## 2013-09-22 NOTE — Evaluation (Signed)
Occupational Therapy Evaluation Patient Details Name: [name] CompanionSidney M Inghram MRN: [id number] DOB: 1931-05-18 Today's Date: 09/22/2013    History of Present Illness 80 y/o male s/p Reverse Total shoulder arthroplasty on 09/21/13.   Clinical Impression   Pt currently requires min-mod A for ADL's, functional mobility & therapeutic exercies s/p R reverse TSA. He will benefit from ADL retraining and review of shoulder ex's next treatment session if he is still here tomorrow. He plans to d/c home and will need HHOT as well as 24 hour supervision/PRN assist. Recommend PT assessment.    Follow Up Recommendations  Home health OT;Supervision/Assistance - 24 hour    Equipment Recommendations  None recommended by OT    Recommendations for Other Services PT consult     Precautions / Restrictions Precautions Precautions: Shoulder Shoulder Interventions: Shoulder sling/immobilizer;Off for dressing/bathing/exercises Precaution Booklet Issued: Yes (comment) Precaution Comments: Handout issued and reviewed w/ pt Required Braces or Orthoses: Sling Restrictions Weight Bearing Restrictions: No      Mobility Bed Mobility Overal bed mobility: Needs Assistance Bed Mobility: Sidelying to Sit;Sit to Supine;Sit to Sidelying   Sidelying to sit: Min assist   Sit to supine: Min assist Sit to sidelying: Supervision General bed mobility comments: Pt has h/o back surgeries and has a stimulator implanted, he understands back techniques, however required assist verbally and phusically for follow through.  Transfers Overall transfer level: Needs assistance Equipment used: 1 person hand held assist Transfers: Sit to/from UGI CorporationStand;Stand Pivot Transfers Sit to Stand: Min assist Stand pivot transfers: Min assist            Balance Overall balance assessment: Needs assistance Sitting-balance support: Feet supported Sitting balance-Leahy Scale: Good Sitting balance - Comments: Sitting EOB and then at sink  during ADL's    Standing balance support: Single extremity supported Standing balance-Leahy Scale: Fair Standing balance comment: Hand held assist on left to be further assessed by PT                            ADL Overall ADL's : Needs assistance/impaired Eating/Feeding: Set up   Grooming: Wash/dry hands;Wash/dry face;Oral care;Set up   Upper Body Bathing: Supervision/ safety;Set up;Min guard;Adhering to UE precautions;Sitting   Lower Body Bathing: Min guard;Supervison/ safety;Sit to/from stand   Upper Body Dressing : Minimal assistance;Cueing for UE precautions;Sitting   Lower Body Dressing: Minimal assistance;Sit to/from stand   Toilet Transfer: BSC;Ambulation;Minimal assistance Toilet Transfer Details (indicate cue type and reason): Hand held assist w/ L UE. Pt reports that he amb w/o AD at home, but uses RW, SPC or manual w/c when out in community. Toileting- Clothing Manipulation and Hygiene: Sit to/from stand;Min guard       Functional mobility during ADLs: Min guard;Minimal assistance;Cueing for sequencing;Caregiver able to provide necessary level of assistance (Pt ambulated w/ HHA on L UE.) General ADL Comments: Pt currently requires Min guard to min A for functional mobility and transfers related to ADL's. Pt is blind in R eye and w/ h/o glaucoma/decreased vision on left. Pt reports that his wife is currently "sick and not well" requiring assist as well. His grandson lives with him and he reports that he will be able to provide PRN assistance. He will benefit from review of HEP, sling use and ADL's.    Vision  See navigator for details: Pt is blind in R eye, h/o glaucoma and limited vision in left eye as well. Wears glasses at all times.  Perception     Praxis      Pertinent Vitals/Pain Pt reports chronic back and acute shoulder pain, he did not rate. RN made aware and gave pain medication.     Hand Dominance Right    Extremity/Trunk Assessment Upper Extremity Assessment Upper Extremity Assessment: RUE deficits/detail RUE Deficits / Details: S/P Reverse total shoulder arthroplasty RUE: Unable to fully assess due to immobilization           Communication Communication Communication: No difficulties   Cognition Arousal/Alertness: Awake/alert Behavior During Therapy: WFL for tasks assessed/performed Overall Cognitive Status: Within Functional Limits for tasks assessed                     General Comments       Exercises   See navigator in EPIC for ther ex as performed today w/ pt. RUE Pendulums, shoulder flexion to 90* (Pt was educated in No ER, No ABD), AROM for right hand, wrist, digits and elbow as well.     Shoulder Instructions Shoulder Instructions Donning/doffing shirt without moving shoulder: Minimal assistance Method for sponge bathing under operated UE: Minimal assistance Donning/doffing sling/immobilizer: Minimal assistance Correct positioning of sling/immobilizer: Minimal assistance Pendulum exercises (written home exercise program): Moderate assistance ROM for elbow, wrist and digits of operated UE: Modified independent Sling wearing schedule (on at all times/off for ADL's): Modified independent Positioning of UE while sleeping: Minimal assistance    Home Living Family/patient expects to be discharged to:: Private residence Living Arrangements: Spouse/significant other   Type of Home: House Home Access: Stairs to enter [employer] of Steps: 7-8 Entrance Stairs-Rails: Right Home Layout: One level     Bathroom Shower/Tub: Walk-in shower;Tub/shower unit Shower/tub characteristics: Curtain Firefighter: Handicapped height     Home Equipment: Environmental consultant - 4 wheels;Wheelchair - manual;Shower seat;Grab bars - tub/shower;Grab bars - toilet;Cane - single point          Prior Functioning/Environment Level of Independence: Independent with assistive  device(s)        Comments: Pt reports independently walking in the house, uses 4 wheeled walker for distances.    OT Diagnosis: Generalized weakness;Acute pain;Blindness and low vision   OT Problem List: Impaired balance (sitting and/or standing);Impaired vision/perception;Decreased knowledge of precautions;Pain;Decreased range of motion;Impaired UE functional use   OT Treatment/Interventions: Self-care/ADL training;DME and/or AE instruction;Patient/family education;Visual/perceptual remediation/compensation;Therapeutic activities;Therapeutic exercise    OT Goals(Current goals can be found in the care plan section) Acute Rehab OT Goals Patient Stated Goal: Home with wife and grandson to assist PRN OT Goal Formulation: With patient Time For Goal Achievement: 09/25/13 Potential to Achieve Goals: Good  OT Frequency: Min 2X/week   Barriers to D/C:            Co-evaluation              End of Session Equipment Utilized During Treatment: Gait belt;Other (comment) (Hand held assist) Nurse Communication: Mobility status;Precautions;Other (comment) (Pt's wife is sick not able to assist pt at home. Grandson lives w/ pt and able to A PRN per pt report.)  Activity Tolerance: Patient tolerated treatment well Patient left: in bed;with call bell/phone within reach;with bed alarm set;with nursing/sitter in room   Time: 0810-0903 OT Time Calculation (min): 53 min Charges:  OT General Charges $OT Visit: 1 Procedure OT Evaluation $Initial OT Evaluation Tier I: 1 Procedure OT Treatments $Self Care/Home Management : 23-37 mins $Therapeutic Exercise: 8-22 mins G-Codes:    Jerod Mcquain B Titus Drone 10-20-13, 9:23 AM

## 2013-09-23 LAB — GLUCOSE, CAPILLARY
Glucose-Capillary: 132 mg/dL — ABNORMAL HIGH (ref 70–99)
Glucose-Capillary: 137 mg/dL — ABNORMAL HIGH (ref 70–99)

## 2013-09-23 NOTE — Care Management (Signed)
CARE MANAGEMENT NOTE 09/23/2013  Patient:  [name], [name]   Account Number:  [id number]  Date Initiated:  09/23/2013  Documentation initiated by:  Vance Peper  Subjective/Objective Assessment:   80 yr old male s/p right shoulder reverse arthroplasty, left shoulder injection.     Action/Plan:   Case manager spoke with patient concerning home health needs. Preoperatively setup with [facility], no changes. Patient states he only wants one treatment to learn exercises. CM will notify liason.   Anticipated DC Date:  09/23/2013   Anticipated DC Plan:  HOME W HOME HEALTH SERVICES      DC Planning Services  CM consult      [facility] Choice  HOME HEALTH   Choice offered to / List presented to:  C-1 Patient        HH arranged  HH-2 PT  HH-3 OT      [facility] agency  [facility]   Status of service:  Completed, signed off Medicare Important Message given?   (If response is "NO", the following Medicare IM given date fields will be blank) Date Medicare IM given:   Date Additional Medicare IM given:    Discharge Disposition:  HOME W HOME HEALTH SERVICES

## 2013-09-23 NOTE — Progress Notes (Signed)
   Subjective: 2 Days Post-Op Procedure(s) (LRB): RIGHT  SHOULDER ARTHROPLASTY REVERSE  (Right) SHOULDER INJECTION (Left)  Pt c/o mild pain in right shoulder today Ready for d/c home Patient reports pain as mild.  Objective:   VITALS:   Filed Vitals:   09/23/13 0627  BP: 130/80  Pulse: 102  Temp: 98.2 F (36.8 C)  Resp: 18    Right shoulder dressing intact  Sling in place nv intact distally No rashes or edema  LABS  Recent Labs  09/22/13 0500  HGB 12.5*  HCT 38.4*     Recent Labs  09/22/13 0500  NA 141  K 4.4  BUN 14  CREATININE 0.98  GLUCOSE 132*     Assessment/Plan: 2 Days Post-Op Procedure(s) (LRB): RIGHT  SHOULDER ARTHROPLASTY REVERSE  (Right) SHOULDER INJECTION (Left)  D/c home Pain control as needed F/u in 2 weeks   Alphonsa Overall, MPAS, PA-C  09/23/2013, 7:39 AM

## 2013-09-23 NOTE — Progress Notes (Signed)
PT Cancellation Note  Patient Details Name: [name] MRN: [id number] DOB: 1931-04-03   Cancelled Treatment:    Reason Eval/Treat Not Completed: Patient declined, no reason specified.  Pt states he is going home today and just wants to sit and wait for his discharge.  Will f/u if pt does not discharge.     Alison Murray Harshini Trent 09/23/2013, 10:45 AM

## 2013-09-24 NOTE — Anesthesia Postprocedure Evaluation (Signed)
  Anesthesia Post-op Note  Patient: [name]  Procedure(s) Performed: Procedure(s): RIGHT  SHOULDER ARTHROPLASTY REVERSE  (Right) SHOULDER INJECTION (Left)  Patient Location: PACU  Anesthesia Type:GA combined with regional for post-op pain  Level of Consciousness: awake and alert   Airway and Oxygen Therapy: Patient Spontanous Breathing  Post-op Pain: none  Post-op Assessment: Post-op Vital signs reviewed  Post-op Vital Signs: stable  Last Vitals:  Filed Vitals:   09/23/13 0627  BP: 130/80  Pulse: 102  Temp: 36.8 C  Resp: 18    Complications: No apparent anesthesia complications

## 2013-09-27 NOTE — Addendum Note (Signed)
Addendum created 09/27/13 0729 by Sharee Holster, MD   Modules edited: Anesthesia Attestations

## 2014-01-28 ENCOUNTER — Encounter: Payer: Self-pay | Admitting: Cardiology

## 2014-05-03 ENCOUNTER — Ambulatory Visit (INDEPENDENT_AMBULATORY_CARE_PROVIDER_SITE_OTHER): Payer: Medicare HMO | Admitting: Ophthalmology

## 2014-05-03 DIAGNOSIS — I10 Essential (primary) hypertension: Secondary | ICD-10-CM

## 2014-05-03 DIAGNOSIS — H34811 Central retinal vein occlusion, right eye: Secondary | ICD-10-CM

## 2014-05-03 DIAGNOSIS — H35033 Hypertensive retinopathy, bilateral: Secondary | ICD-10-CM

## 2014-05-03 DIAGNOSIS — E11311 Type 2 diabetes mellitus with unspecified diabetic retinopathy with macular edema: Secondary | ICD-10-CM

## 2014-05-03 DIAGNOSIS — H4311 Vitreous hemorrhage, right eye: Secondary | ICD-10-CM

## 2014-05-03 DIAGNOSIS — E11329 Type 2 diabetes mellitus with mild nonproliferative diabetic retinopathy without macular edema: Secondary | ICD-10-CM

## 2014-05-03 DIAGNOSIS — E11351 Type 2 diabetes mellitus with proliferative diabetic retinopathy with macular edema: Secondary | ICD-10-CM

## 2014-07-08 DIAGNOSIS — L603 Nail dystrophy: Secondary | ICD-10-CM | POA: Diagnosis not present

## 2014-07-08 DIAGNOSIS — E1151 Type 2 diabetes mellitus with diabetic peripheral angiopathy without gangrene: Secondary | ICD-10-CM | POA: Diagnosis not present

## 2014-07-08 DIAGNOSIS — I739 Peripheral vascular disease, unspecified: Secondary | ICD-10-CM | POA: Diagnosis not present

## 2014-07-12 DIAGNOSIS — H6090 Unspecified otitis externa, unspecified ear: Secondary | ICD-10-CM | POA: Diagnosis not present

## 2014-07-12 DIAGNOSIS — G4733 Obstructive sleep apnea (adult) (pediatric): Secondary | ICD-10-CM | POA: Diagnosis not present

## 2014-07-12 DIAGNOSIS — L409 Psoriasis, unspecified: Secondary | ICD-10-CM | POA: Diagnosis not present

## 2014-07-29 DIAGNOSIS — M47817 Spondylosis without myelopathy or radiculopathy, lumbosacral region: Secondary | ICD-10-CM | POA: Diagnosis not present

## 2014-07-29 DIAGNOSIS — M549 Dorsalgia, unspecified: Secondary | ICD-10-CM | POA: Diagnosis not present

## 2014-07-29 DIAGNOSIS — G894 Chronic pain syndrome: Secondary | ICD-10-CM | POA: Diagnosis not present

## 2014-07-29 DIAGNOSIS — M5137 Other intervertebral disc degeneration, lumbosacral region: Secondary | ICD-10-CM | POA: Diagnosis not present

## 2014-07-29 DIAGNOSIS — Z79899 Other long term (current) drug therapy: Secondary | ICD-10-CM | POA: Diagnosis not present

## 2014-07-29 DIAGNOSIS — M488X6 Other specified spondylopathies, lumbar region: Secondary | ICD-10-CM | POA: Diagnosis not present

## 2014-08-19 DIAGNOSIS — H47233 Glaucomatous optic atrophy, bilateral: Secondary | ICD-10-CM | POA: Diagnosis not present

## 2014-08-19 DIAGNOSIS — H20041 Secondary noninfectious iridocyclitis, right eye: Secondary | ICD-10-CM | POA: Diagnosis not present

## 2014-08-19 DIAGNOSIS — H34811 Central retinal vein occlusion, right eye: Secondary | ICD-10-CM | POA: Diagnosis not present

## 2014-08-23 DIAGNOSIS — H9202 Otalgia, left ear: Secondary | ICD-10-CM | POA: Diagnosis not present

## 2014-09-06 ENCOUNTER — Emergency Department (HOSPITAL_COMMUNITY): Payer: Commercial Managed Care - HMO

## 2014-09-06 ENCOUNTER — Encounter (HOSPITAL_COMMUNITY): Payer: Self-pay | Admitting: Emergency Medicine

## 2014-09-06 ENCOUNTER — Observation Stay (HOSPITAL_COMMUNITY)
Admission: EM | Admit: 2014-09-06 | Discharge: 2014-09-08 | Disposition: A | Discharge status: Home / Self Care | Payer: Commercial Managed Care - HMO | Attending: Internal Medicine | Admitting: Internal Medicine

## 2014-09-06 DIAGNOSIS — H409 Unspecified glaucoma: Secondary | ICD-10-CM | POA: Insufficient documentation

## 2014-09-06 DIAGNOSIS — E785 Hyperlipidemia, unspecified: Secondary | ICD-10-CM | POA: Diagnosis not present

## 2014-09-06 DIAGNOSIS — R404 Transient alteration of awareness: Secondary | ICD-10-CM | POA: Diagnosis not present

## 2014-09-06 DIAGNOSIS — E119 Type 2 diabetes mellitus without complications: Secondary | ICD-10-CM

## 2014-09-06 DIAGNOSIS — Z87891 Personal history of nicotine dependence: Secondary | ICD-10-CM | POA: Diagnosis not present

## 2014-09-06 DIAGNOSIS — M1990 Unspecified osteoarthritis, unspecified site: Secondary | ICD-10-CM | POA: Diagnosis not present

## 2014-09-06 DIAGNOSIS — I959 Hypotension, unspecified: Secondary | ICD-10-CM | POA: Diagnosis not present

## 2014-09-06 DIAGNOSIS — Z79899 Other long term (current) drug therapy: Secondary | ICD-10-CM | POA: Diagnosis not present

## 2014-09-06 DIAGNOSIS — E669 Obesity, unspecified: Secondary | ICD-10-CM | POA: Diagnosis present

## 2014-09-06 DIAGNOSIS — G8929 Other chronic pain: Secondary | ICD-10-CM | POA: Diagnosis not present

## 2014-09-06 DIAGNOSIS — M109 Gout, unspecified: Secondary | ICD-10-CM | POA: Diagnosis not present

## 2014-09-06 DIAGNOSIS — G4700 Insomnia, unspecified: Secondary | ICD-10-CM | POA: Diagnosis not present

## 2014-09-06 DIAGNOSIS — G4730 Sleep apnea, unspecified: Secondary | ICD-10-CM | POA: Diagnosis not present

## 2014-09-06 DIAGNOSIS — R55 Syncope and collapse: Principal | ICD-10-CM

## 2014-09-06 DIAGNOSIS — H548 Legal blindness, as defined in USA: Secondary | ICD-10-CM | POA: Insufficient documentation

## 2014-09-06 DIAGNOSIS — E860 Dehydration: Secondary | ICD-10-CM

## 2014-09-06 DIAGNOSIS — H6692 Otitis media, unspecified, left ear: Secondary | ICD-10-CM | POA: Diagnosis not present

## 2014-09-06 DIAGNOSIS — I10 Essential (primary) hypertension: Secondary | ICD-10-CM | POA: Diagnosis present

## 2014-09-06 DIAGNOSIS — K5900 Constipation, unspecified: Secondary | ICD-10-CM | POA: Diagnosis not present

## 2014-09-06 DIAGNOSIS — G9340 Encephalopathy, unspecified: Secondary | ICD-10-CM

## 2014-09-06 DIAGNOSIS — R401 Stupor: Secondary | ICD-10-CM | POA: Diagnosis not present

## 2014-09-06 DIAGNOSIS — Z86010 Personal history of colonic polyps: Secondary | ICD-10-CM | POA: Diagnosis not present

## 2014-09-06 HISTORY — DX: Syncope and collapse: R55

## 2014-09-06 LAB — URINALYSIS, ROUTINE W REFLEX MICROSCOPIC
Bilirubin Urine: NEGATIVE
Glucose, UA: NEGATIVE mg/dL
Hgb urine dipstick: NEGATIVE
Ketones, ur: NEGATIVE mg/dL
Leukocytes, UA: NEGATIVE
Nitrite: NEGATIVE
Protein, ur: NEGATIVE mg/dL
Specific Gravity, Urine: 1.016 (ref 1.005–1.030)
Urobilinogen, UA: 0.2 mg/dL (ref 0.0–1.0)
pH: 5 (ref 5.0–8.0)

## 2014-09-06 LAB — COMPREHENSIVE METABOLIC PANEL
ALT: 12 U/L (ref 0–53)
AST: 16 U/L (ref 0–37)
Albumin: 3.5 g/dL (ref 3.5–5.2)
Alkaline Phosphatase: 69 U/L (ref 39–117)
Anion gap: 7 (ref 5–15)
BUN: 14 mg/dL (ref 6–23)
CO2: 25 mmol/L (ref 19–32)
Calcium: 8.7 mg/dL (ref 8.4–10.5)
Chloride: 106 mmol/L (ref 96–112)
Creatinine, Ser: 1.34 mg/dL (ref 0.50–1.35)
GFR calc Af Amer: 56 mL/min — ABNORMAL LOW (ref >90)
GFR calc non Af Amer: 48 mL/min — ABNORMAL LOW (ref >90)
Glucose, Bld: 120 mg/dL — ABNORMAL HIGH (ref 70–99)
Potassium: 3.3 mmol/L — ABNORMAL LOW (ref 3.5–5.1)
Sodium: 138 mmol/L (ref 135–145)
Total Bilirubin: 0.7 mg/dL (ref 0.3–1.2)
Total Protein: 6.2 g/dL (ref 6.0–8.3)

## 2014-09-06 LAB — CBC WITH DIFFERENTIAL/PLATELET
Basophils Absolute: 0 10*3/uL (ref 0.0–0.1)
Basophils Relative: 0 % (ref 0–1)
Eosinophils Absolute: 0.4 10*3/uL (ref 0.0–0.7)
Eosinophils Relative: 5 % (ref 0–5)
HCT: 43 % (ref 39.0–52.0)
Hemoglobin: 14.3 g/dL (ref 13.0–17.0)
Lymphocytes Relative: 13 % (ref 12–46)
Lymphs Abs: 1 10*3/uL (ref 0.7–4.0)
MCH: 29.6 pg (ref 26.0–34.0)
MCHC: 33.3 g/dL (ref 30.0–36.0)
MCV: 89 fL (ref 78.0–100.0)
Monocytes Absolute: 0.7 10*3/uL (ref 0.1–1.0)
Monocytes Relative: 9 % (ref 3–12)
Neutro Abs: 5.7 10*3/uL (ref 1.7–7.7)
Neutrophils Relative %: 73 % (ref 43–77)
Platelets: 143 10*3/uL — ABNORMAL LOW (ref 150–400)
RBC: 4.83 MIL/uL (ref 4.22–5.81)
RDW: 13.4 % (ref 11.5–15.5)
WBC: 7.7 10*3/uL (ref 4.0–10.5)

## 2014-09-06 LAB — I-STAT CG4 LACTIC ACID, ED
Lactic Acid, Venous: 1.49 mmol/L (ref 0.5–2.0)
Lactic Acid, Venous: 1.52 mmol/L (ref 0.5–2.0)

## 2014-09-06 LAB — RAPID URINE DRUG SCREEN, HOSP PERFORMED
Amphetamines: NOT DETECTED
Barbiturates: NOT DETECTED
Benzodiazepines: NOT DETECTED
Cocaine: NOT DETECTED
Opiates: NOT DETECTED
Tetrahydrocannabinol: NOT DETECTED

## 2014-09-06 LAB — AMMONIA: Ammonia: 29 umol/L (ref 11–32)

## 2014-09-06 MED ORDER — NALOXONE HCL 0.4 MG/ML IJ SOLN
0.4000 mg | Freq: Once | INTRAMUSCULAR | Status: AC
  Administered 2014-09-06: 0.4 mg via INTRAVENOUS
  Filled 2014-09-06: qty 1

## 2014-09-06 MED ORDER — SODIUM CHLORIDE 0.9 % IV BOLUS (SEPSIS)
1000.0000 mL | Freq: Once | INTRAVENOUS | Status: AC
  Administered 2014-09-06: 1000 mL via INTRAVENOUS

## 2014-09-06 NOTE — ED Notes (Signed)
MD at bedside. Dr. Durward Fortes.

## 2014-09-06 NOTE — ED Notes (Signed)
Patient presents today via EMS with c/o going to an MD today for right sided possible Ear infection. Upon returning to home with family he passed out. When EMS arrived he was A&O x4. They were about to leave and he had a second syncopal episode with BP 66/44 and a baseline of 110/60. During transport a third syncopal episode was observed by EMS. BP was unstable going up and down, highest 135/75 with NS infusing PIV and lowest being the 66/44. Patient is legally blind and has DM with CBG 127. Lungs were clear to auscultation and no temperature was noted. Denies eating the past several days and family states he has taken in very small amounts of fluid PO. However, the patient has been urinating more than normal. 700 ml of NS provided via EMS.

## 2014-09-06 NOTE — ED Provider Notes (Signed)
CSN: [id number]     Arrival date & time 09/06/14  1929 History   First MD Initiated Contact with Patient 09/06/14 1936     Chief Complaint  Patient presents with  . Loss of Consciousness  . Hypotension     (Consider location/radiation/quality/duration/timing/severity/associated sxs/prior Treatment) Patient is a 81 y.o. male presenting with syncope. The history is provided by the patient.  Loss of Consciousness Episode history:  Multiple Most recent episode:  Today Timing:  Intermittent Progression:  Waxing and waning Chronicity:  New Witnessed: yes   Relieved by:  None tried Worsened by:  Nothing tried Ineffective treatments:  None tried Associated symptoms: no anxiety, no confusion, no nausea, no shortness of breath and no vomiting     Past Medical History  Diagnosis Date  . Gout     takes Allopurinol daily  . Sleep apnea   . Hyperlipidemia     takes Pravastatin daily  . Gastric ulcer     50 Years ago  . Arthritis     All Joints  . Insomnia     uses OTC sleep aide  . Glaucoma     uses eye drops daily  . Morbid obesity   . Hypertension     takes Amlodipine and Losartan daily  . Pleurisy 20+yrs ago  . Joint pain   . Joint swelling     right knee   . Peripheral edema   . Chronic back pain   . Constipation     takes an OTC med as needed  . History of colon polyps   . Urinary frequency     takes Flomax daily  . Nocturia   . Macular degeneration   . Legally blind   . Shortness of breath   . Diabetes mellitus     takes Metformin daily   Past Surgical History  Procedure Laterality Date  . Cataract extraction w/ intraocular lens  implant, bilateral Bilateral   . Knee arthroscopy Right     1990'  . Back surgery    . Glaucoma surgery Bilateral   . Mini shunt insertion  09/26/2011    Procedure: INSERTION OF MINI SHUNT;  Surgeon: Chalmers Guest, MD;  Location: [facility] OR;  Service: Ophthalmology;  Laterality: Right;  Insertion of Ahmed shunt  . Neurostimulator placed   about 1yrs ago  . Colonoscopy    . Shoulder surgery Right 09/22/2013    REVERSAL    DR NORRIS  . Reverse shoulder arthroplasty Right 09/21/2013    Procedure: RIGHT  SHOULDER ARTHROPLASTY REVERSE ;  Surgeon: [name], MD;  Location: [facility] OR;  Service: Orthopedics;  Laterality: Right;  . Shoulder injection Left 09/21/2013    Procedure: SHOULDER INJECTION;  Surgeon: [name], MD;  Location: [facility] OR;  Service: Orthopedics;  Laterality: Left;   Family History  Problem Relation Age of Onset  . Anesthesia problems Neg Hx    History  Substance Use Topics  . Smoking status: Former Smoker -- 2.00 packs/day    Types: Cigarettes  . Smokeless tobacco: Never Used     Comment: quit smoking 19yrs ago  . Alcohol Use: Yes     Comment: rarely    Review of Systems  Respiratory: Negative for shortness of breath.   Cardiovascular: Positive for syncope.  Gastrointestinal: Negative for nausea and vomiting.  Psychiatric/Behavioral: Negative for confusion.  All other systems reviewed and are negative.     Allergies  Review of patient's allergies indicates no known allergies.  Home Medications  Prior to Admission medications   Medication Sig Start Date End Date Taking? Authorizing Provider  allopurinol (ZYLOPRIM) 300 MG tablet Take 300 mg by mouth daily.   Yes Historical Provider, MD  metFORMIN (GLUCOPHAGE) 1000 MG tablet Take 1,000 mg by mouth 2 (two) times daily with a meal.   Yes Historical Provider, MD  oxyCODONE (OXY IR/ROXICODONE) 5 MG immediate release tablet Take 5 mg by mouth every 4 (four) hours as needed for severe pain.   Yes Historical Provider, MD  pravastatin (PRAVACHOL) 40 MG tablet Take 40 mg by mouth daily.   Yes Historical Provider, MD  amLODipine (NORVASC) 5 MG tablet Take 1 tablet (5 mg total) by mouth daily. 12/30/12   Richarda Overlie, MD  Brinzolamide-Brimonidine (SIMBRINZA) 1-0.2 % SUSP Place 1 drop into the left eye 3 (three) times daily.     Historical Provider, MD   dorzolamide (TRUSOPT) 2 % ophthalmic solution Place 1 drop into the right eye 3 (three) times daily.     Historical Provider, MD  gabapentin (NEURONTIN) 300 MG capsule Take 1 capsule (300 mg total) by mouth 3 (three) times daily. 12/30/12   Richarda Overlie, MD  ketoconazole (NIZORAL) 2 % cream Apply 1 application topically 2 (two) times daily as needed for irritation.     Historical Provider, MD  losartan (COZAAR) 25 MG tablet Take 1 tablet (25 mg total) by mouth daily. 12/30/12   Richarda Overlie, MD  oxyCODONE-acetaminophen (ROXICET) 5-325 MG per tablet Take 1-2 tablets by mouth every 4 (four) hours as needed for severe pain. Patient not taking: Reported on 09/07/2014 09/22/13   Beverely Low, MD  Polyethyl Glycol-Propyl Glycol (SYSTANE OP) Place 1 drop into the left eye 3 (three) times daily.    Historical Provider, MD  Tamsulosin HCl (FLOMAX) 0.4 MG CAPS Take 0.4 mg by mouth daily.    Historical Provider, MD   BP 126/74 mmHg  Pulse 96  Temp(Src) 98.3 F (36.8 C) (Oral)  Resp 13  Ht  (1.778 m)  Wt 232 lb (105.235 kg)  BMI 33.29 kg/m2  SpO2 95% Physical Exam  Constitutional: He is oriented to person, place, and time. He appears well-developed and well-nourished. No distress.   Sleepy but arousable. Answers questions appropriately.  HENT:  Head: Normocephalic and atraumatic.  Mouth/Throat: Oropharynx is clear and moist. No oropharyngeal exudate.   Mucous membranes dry   Eyes: Pupils are equal, round, and reactive to light.   blind  Neck: Normal range of motion. Neck supple.  Cardiovascular: Normal rate, regular rhythm, normal heart sounds and intact distal pulses.  Exam reveals no gallop and no friction rub.   No murmur heard. Pulmonary/Chest: Effort normal and breath sounds normal. No respiratory distress. He has no wheezes. He has no rales.  Abdominal: Soft. He exhibits no distension and no mass. There is no tenderness. There is no rebound and no guarding.  Musculoskeletal: Normal range  of motion. He exhibits no edema or tenderness.  Lymphadenopathy:    He has no cervical adenopathy.  Neurological: He is alert and oriented to person, place, and time. He has normal strength. No cranial nerve deficit or sensory deficit.  Skin: Skin is warm and dry. No rash noted. He is not diaphoretic.  Psychiatric: He has a normal mood and affect. His behavior is normal. Judgment and thought content normal.  Nursing note and vitals reviewed.   ED Course  Procedures (including critical care time) Labs Review Labs Reviewed  URINALYSIS, ROUTINE W REFLEX MICROSCOPIC - Abnormal;  Notable for the following:    APPearance CLOUDY (*)    All other components within normal limits  CBC WITH DIFFERENTIAL/PLATELET - Abnormal; Notable for the following:    Platelets 143 (*)    All other components within normal limits  COMPREHENSIVE METABOLIC PANEL - Abnormal; Notable for the following:    Potassium 3.3 (*)    Glucose, Bld 120 (*)    GFR calc non Af Amer 48 (*)    GFR calc Af Amer 56 (*)    All other components within normal limits  AMMONIA  URINE RAPID DRUG SCREEN (HOSP PERFORMED)  I-STAT CG4 LACTIC ACID, ED  I-STAT CG4 LACTIC ACID, ED    Imaging Review Ct Head Wo Contrast  09/06/2014   CLINICAL DATA:  Hypotension and loss of consciousness. 3 syncopal episodes.  EXAM: CT HEAD WITHOUT CONTRAST  TECHNIQUE: Contiguous axial images were obtained from the base of the skull through the vertex without intravenous contrast.  COMPARISON:  None.  FINDINGS: Diffuse cerebral atrophy. Mild ventricular dilatation consistent with central atrophy. Patchy low-attenuation changes in the deep white matter consistent small vessel ischemic changes. Multiple old lacunar infarcts in the deep white matter particularly on the left. No mass effect or midline shift. No abnormal extra-axial fluid collections. Gray-white matter junctions are distinct. Basal cisterns are not effaced. No evidence of acute intracranial  hemorrhage. No depressed skull fractures. Visualized paranasal sinuses and mastoid air cells are not opacified. Vascular calcifications.  IMPRESSION: No acute intracranial abnormalities. Chronic atrophy and small vessel ischemic changes.   Electronically Signed   By: Burman Nieves M.D.   On: 09/06/2014 23:32     EKG Interpretation   Date/Time:  Monday September 06 2014 20:06:35 EDT Ventricular Rate:  93 PR Interval:  194 QRS Duration: 143 QT Interval:  419 QTC Calculation: 521 R Axis:   -75 Text Interpretation:  Age not entered, assumed to be  81 years old for  purpose of ECG interpretation Sinus rhythm Atrial premature complexes RBBB  and LAFB Confirmed by Lincoln Brigham [id number]) on 09/06/2014 9:02:10 PM      MDM   Final diagnoses:  Syncope and collapse  Dehydration     81 year old male with history of insomnia, blindness,  Chronic pain, diabetes presents with syncopal episodes and hypertension. States that he was seen by his PCP for possible ear infection and had a syncopal episode upon arriving back home. The patient notes no preceding symptoms He had another episode with EMS. This occurred with a drop in blood pressure that was measured.  On arrival the patient is normotensive and sleepy. He does wake up and respond to questions appropriately. He has a normal neuro exam. Pupils are not pinpoint. No respiratory depression. Does not appear that this is an overdose. His mucous membranes do appear dry and he does endorse decreased intake recently as well as more frequent urination. Question urinary tract infection or diabetes competition. He is not  Hyperglycemic and does not appear to be in DKA. Laboratory workup including urinalysis will be obtained. Fluid hydration given. Question orthostatic syncope given history.  Laboratory workup reassuring. Urinalysis negative. Lactate normal. The patient remains sleepy but easily arousable and without neurologic deficits. CT head with no acute  intracranial abnormalities. Discussed with the hospitalist and will admit for episodes of hypotension with syncope. Continue fluid hydration. Narcan given times one with no difference in sleepiness. This coincides with my suspicion that sleepiness is likely related to the patient's stated insomnia as he again  has no other evidence of narcotic overdose and by report from the patient and the family has not taken any of his pain medications today.  Dorna Leitz, MD 09/07/14 0383  Tilden Fossa, MD 09/07/14 [phone]

## 2014-09-06 NOTE — ED Notes (Signed)
EKG given to Dr. Pecola Leisure.

## 2014-09-06 NOTE — ED Notes (Addendum)
Patient is sleeping. Requires multiple questions to get a response from patient about where he is at however he is able to answer that he is at the Hospital, correct DOB and his name. He is unable to state the current month or the last holiday. Family states this is something he would normally know at baseline.

## 2014-09-07 ENCOUNTER — Observation Stay (HOSPITAL_COMMUNITY): Payer: Commercial Managed Care - HMO

## 2014-09-07 ENCOUNTER — Encounter (HOSPITAL_COMMUNITY): Payer: Self-pay | Admitting: General Practice

## 2014-09-07 DIAGNOSIS — G9340 Encephalopathy, unspecified: Secondary | ICD-10-CM | POA: Diagnosis present

## 2014-09-07 DIAGNOSIS — R55 Syncope and collapse: Secondary | ICD-10-CM | POA: Diagnosis not present

## 2014-09-07 DIAGNOSIS — E785 Hyperlipidemia, unspecified: Secondary | ICD-10-CM | POA: Diagnosis not present

## 2014-09-07 DIAGNOSIS — S299XXA Unspecified injury of thorax, initial encounter: Secondary | ICD-10-CM | POA: Diagnosis not present

## 2014-09-07 DIAGNOSIS — E860 Dehydration: Secondary | ICD-10-CM | POA: Diagnosis not present

## 2014-09-07 DIAGNOSIS — M109 Gout, unspecified: Secondary | ICD-10-CM | POA: Diagnosis not present

## 2014-09-07 DIAGNOSIS — H409 Unspecified glaucoma: Secondary | ICD-10-CM | POA: Diagnosis not present

## 2014-09-07 DIAGNOSIS — G9389 Other specified disorders of brain: Secondary | ICD-10-CM | POA: Diagnosis not present

## 2014-09-07 DIAGNOSIS — M1990 Unspecified osteoarthritis, unspecified site: Secondary | ICD-10-CM | POA: Diagnosis not present

## 2014-09-07 DIAGNOSIS — G4730 Sleep apnea, unspecified: Secondary | ICD-10-CM | POA: Diagnosis not present

## 2014-09-07 DIAGNOSIS — G4700 Insomnia, unspecified: Secondary | ICD-10-CM | POA: Diagnosis not present

## 2014-09-07 HISTORY — DX: Syncope and collapse: R55

## 2014-09-07 LAB — COMPREHENSIVE METABOLIC PANEL
ALT: 11 U/L (ref 0–53)
AST: 20 U/L (ref 0–37)
Albumin: 3.2 g/dL — ABNORMAL LOW (ref 3.5–5.2)
Alkaline Phosphatase: 72 U/L (ref 39–117)
Anion gap: 4 — ABNORMAL LOW (ref 5–15)
BUN: 14 mg/dL (ref 6–23)
CO2: 28 mmol/L (ref 19–32)
Calcium: 8.7 mg/dL (ref 8.4–10.5)
Chloride: 105 mmol/L (ref 96–112)
Creatinine, Ser: 1.26 mg/dL (ref 0.50–1.35)
GFR calc Af Amer: 60 mL/min — ABNORMAL LOW (ref >90)
GFR calc non Af Amer: 52 mL/min — ABNORMAL LOW (ref >90)
Glucose, Bld: 122 mg/dL — ABNORMAL HIGH (ref 70–99)
Potassium: 4.3 mmol/L (ref 3.5–5.1)
Sodium: 137 mmol/L (ref 135–145)
Total Bilirubin: 1.1 mg/dL (ref 0.3–1.2)
Total Protein: 5.7 g/dL — ABNORMAL LOW (ref 6.0–8.3)

## 2014-09-07 LAB — CBC WITH DIFFERENTIAL/PLATELET
Basophils Absolute: 0 10*3/uL (ref 0.0–0.1)
Basophils Relative: 0 % (ref 0–1)
Eosinophils Absolute: 0.3 10*3/uL (ref 0.0–0.7)
Eosinophils Relative: 6 % — ABNORMAL HIGH (ref 0–5)
HCT: 41.3 % (ref 39.0–52.0)
Hemoglobin: 13.7 g/dL (ref 13.0–17.0)
Lymphocytes Relative: 21 % (ref 12–46)
Lymphs Abs: 1.1 10*3/uL (ref 0.7–4.0)
MCH: 29.3 pg (ref 26.0–34.0)
MCHC: 33.2 g/dL (ref 30.0–36.0)
MCV: 88.4 fL (ref 78.0–100.0)
Monocytes Absolute: 0.4 10*3/uL (ref 0.1–1.0)
Monocytes Relative: 8 % (ref 3–12)
Neutro Abs: 3.4 10*3/uL (ref 1.7–7.7)
Neutrophils Relative %: 64 % (ref 43–77)
Platelets: 158 10*3/uL (ref 150–400)
RBC: 4.67 MIL/uL (ref 4.22–5.81)
RDW: 13.4 % (ref 11.5–15.5)
WBC: 5.2 10*3/uL (ref 4.0–10.5)

## 2014-09-07 LAB — I-STAT ARTERIAL BLOOD GAS, ED
Acid-base deficit: 1 mmol/L (ref 0.0–2.0)
Bicarbonate: 24.3 mEq/L — ABNORMAL HIGH (ref 20.0–24.0)
O2 Saturation: 92 %
Patient temperature: 98.6
TCO2: 26 mmol/L (ref 0–100)
pCO2 arterial: 43.4 mmHg (ref 35.0–45.0)
pH, Arterial: 7.355 (ref 7.350–7.450)
pO2, Arterial: 66 mmHg — ABNORMAL LOW (ref 80.0–100.0)

## 2014-09-07 LAB — CREATININE, SERUM
Creatinine, Ser: 1.33 mg/dL (ref 0.50–1.35)
GFR calc Af Amer: 56 mL/min — ABNORMAL LOW (ref >90)
GFR calc non Af Amer: 48 mL/min — ABNORMAL LOW (ref >90)

## 2014-09-07 LAB — CBG MONITORING, ED
Glucose-Capillary: 112 mg/dL — ABNORMAL HIGH (ref 70–99)
Glucose-Capillary: 95 mg/dL (ref 70–99)
Glucose-Capillary: 96 mg/dL (ref 70–99)
Glucose-Capillary: 118 mg/dL — ABNORMAL HIGH (ref 70–99)

## 2014-09-07 LAB — CBC
HCT: 40.1 % (ref 39.0–52.0)
Hemoglobin: 13.4 g/dL (ref 13.0–17.0)
MCH: 29.5 pg (ref 26.0–34.0)
MCHC: 33.4 g/dL (ref 30.0–36.0)
MCV: 88.1 fL (ref 78.0–100.0)
Platelets: 138 10*3/uL — ABNORMAL LOW (ref 150–400)
RBC: 4.55 MIL/uL (ref 4.22–5.81)
RDW: 13.3 % (ref 11.5–15.5)
WBC: 5.8 10*3/uL (ref 4.0–10.5)

## 2014-09-07 LAB — TROPONIN I
Troponin I: <0.03 ng/mL (ref <0.031)
Troponin I: <0.03 ng/mL (ref <0.031)
Troponin I: <0.03 ng/mL (ref <0.031)

## 2014-09-07 LAB — GLUCOSE, CAPILLARY
Glucose-Capillary: 106 mg/dL — ABNORMAL HIGH (ref 70–99)
Glucose-Capillary: 126 mg/dL — ABNORMAL HIGH (ref 70–99)
Glucose-Capillary: 141 mg/dL — ABNORMAL HIGH (ref 70–99)

## 2014-09-07 LAB — TSH: TSH: 0.883 u[IU]/mL (ref 0.350–4.500)

## 2014-09-07 LAB — MAGNESIUM: Magnesium: 1.9 mg/dL (ref 1.5–2.5)

## 2014-09-07 LAB — BRAIN NATRIURETIC PEPTIDE: B Natriuretic Peptide: 17.1 pg/mL (ref 0.0–100.0)

## 2014-09-07 MED ORDER — SODIUM CHLORIDE 0.9 % IJ SOLN
3.0000 mL | Freq: Two times a day (BID) | INTRAMUSCULAR | Status: DC
  Administered 2014-09-07: 3 mL via INTRAVENOUS
  Filled 2014-09-07: qty 3

## 2014-09-07 MED ORDER — HEPARIN SODIUM (PORCINE) 5000 UNIT/ML IJ SOLN
5000.0000 [IU] | Freq: Three times a day (TID) | INTRAMUSCULAR | Status: DC
Start: 2014-09-07 — End: 2014-09-08
  Administered 2014-09-07 – 2014-09-08 (×3): 5000 [IU] via SUBCUTANEOUS
  Filled 2014-09-07 (×3): qty 1

## 2014-09-07 MED ORDER — POTASSIUM CHLORIDE IN NACL 20-0.9 MEQ/L-% IV SOLN: INTRAVENOUS | Status: DC

## 2014-09-07 MED ORDER — POTASSIUM CHLORIDE 10 MEQ/100ML IV SOLN
10.0000 meq | INTRAVENOUS | Status: AC
Start: 2014-09-07 — End: 2014-09-07
  Administered 2014-09-07 (×2): 10 meq via INTRAVENOUS
  Filled 2014-09-07 (×2): qty 100

## 2014-09-07 MED ORDER — ASPIRIN EC 81 MG PO TBEC
81.0000 mg | DELAYED_RELEASE_TABLET | Freq: Every day | ORAL | Status: DC
  Administered 2014-09-07 – 2014-09-08 (×2): 81 mg via ORAL
  Filled 2014-09-07 (×2): qty 1

## 2014-09-07 MED ORDER — SODIUM CHLORIDE 0.9 % IJ SOLN
3.0000 mL | Freq: Two times a day (BID) | INTRAMUSCULAR | Status: DC
  Administered 2014-09-07 (×3): 3 mL via INTRAVENOUS
  Filled 2014-09-07 (×2): qty 3

## 2014-09-07 MED ORDER — SODIUM CHLORIDE 0.9 % IV SOLN
INTRAVENOUS | Status: AC
  Administered 2014-09-07: 08:00:00 via INTRAVENOUS

## 2014-09-07 MED ORDER — POTASSIUM CHLORIDE 10 MEQ/100ML IV SOLN
10.0000 meq | INTRAVENOUS | Status: AC
  Administered 2014-09-07 (×2): 10 meq via INTRAVENOUS
  Filled 2014-09-07 (×2): qty 100

## 2014-09-07 MED ORDER — AMLODIPINE BESYLATE 5 MG PO TABS
5.0000 mg | ORAL_TABLET | Freq: Every day | ORAL | Status: DC
  Administered 2014-09-07 – 2014-09-08 (×2): 5 mg via ORAL
  Filled 2014-09-07 (×2): qty 1

## 2014-09-07 MED ORDER — HEPARIN SODIUM (PORCINE) 5000 UNIT/ML IJ SOLN
5000.0000 [IU] | Freq: Three times a day (TID) | INTRAMUSCULAR | Status: DC
  Administered 2014-09-07: 5000 [IU] via SUBCUTANEOUS
  Filled 2014-09-07: qty 1

## 2014-09-07 MED ORDER — INSULIN ASPART 100 UNIT/ML ~~LOC~~ SOLN
0.0000 [IU] | SUBCUTANEOUS | Status: DC
  Administered 2014-09-07: 1 [IU] via SUBCUTANEOUS

## 2014-09-07 NOTE — ED Notes (Signed)
CBG = 96  RN Anna informed of result.

## 2014-09-07 NOTE — Progress Notes (Addendum)
Patient seen and examined  No neurologic deficits noted, recently being treated for possible ear infection, had a syncopal episode upon driving back home Found to be hypotensive by EMS, on arrival to the ED normotensive but somnolent Patient received Narcan with no significant change in his neurologic status  Plan Syncope TIA vs cardiogenic etiology EKG shows PACs with bundle branch block/right anterior vascular block We'll continue to cycle cardiac enzymes ABG shows mild hypoxia No acute infarct but remote left basal ganglia infarct We'll start the patient on baby aspirin 2-D echo pending No signs of infection SIMILAR EPISODE 3 YRS AGO,when his BP in the 50's Suspect arrhythmia, may need a holter  Will get cardiology to eval   Diabetes type 2 Hold metformin Continue SSI Hemoglobin A1c pending  Hypertension Hold Cozaar, restart Norvasc

## 2014-09-07 NOTE — ED Notes (Signed)
PT finished eating breakfast tray.

## 2014-09-07 NOTE — ED Notes (Signed)
cbg 118 

## 2014-09-07 NOTE — Evaluation (Signed)
Physical Therapy Evaluation Patient Details Name: [name] MRN: [id number] DOB: [birth year] Today's Date: 09/07/2014   History of Present Illness  Patient is a 81 y/o male with PMH of type 2 DM, morbid obesity, OSA, macular degeneration and legal blindness presents with encephalopathy and witnessed syncope x2. EKG shows PACs with bundle branch block/right anterior vascular block. Pulmonary congestion on CXR.  Clinical Impression  Patient presents with generalized weakness, blindness, balance deficits and impaired endurance impacting mobility. Tolerated short distance ambulation with Min guard assist for safety with some difficulty standing from low surface. Pt would benefit from skilled PT to improve transfers, gait, balance and mobility so pt can maximize independence and return to PLOF.    Follow Up Recommendations Home health PT;Supervision/Assistance - 24 hour    Equipment Recommendations  None recommended by PT    Recommendations for Other Services       Precautions / Restrictions Precautions Precautions: Fall Restrictions Weight Bearing Restrictions: No      Mobility  Bed Mobility Overal bed mobility: Modified Independent                Transfers Overall transfer level: Needs assistance Equipment used: Rolling walker (2 wheeled) Transfers: Sit to/from Stand Sit to Stand: Min guard         General transfer comment: Min guard for safety as pt with increased difficulty standing requiring multiple attempts and use of momentum.  Ambulation/Gait Ambulation/Gait assistance: Min guard Ambulation Distance (Feet): 100 Feet Assistive device: Rolling walker (2 wheeled) Gait Pattern/deviations: Step-through pattern;Decreased stride length;Trunk flexed   Gait velocity interpretation: Below normal speed for age/gender General Gait Details: Pt with slow, mildly unsteady gait. Fatigues easily.  Stairs            Wheelchair Mobility    Modified Rankin  (Stroke Patients Only)       Balance Overall balance assessment: Needs assistance Sitting-balance support: Feet supported;No upper extremity supported Sitting balance-Leahy Scale: Fair Sitting balance - Comments: Assist to donn socks.    Standing balance support: During functional activity Standing balance-Leahy Scale: Poor Standing balance comment: Relient on RW for support.                              Pertinent Vitals/Pain Pain Assessment: No/denies pain    Home Living Family/patient expects to be discharged to:: Private residence Living Arrangements: Spouse/significant other;Other relatives Available Help at Discharge: Family;Available 24 hours/day Type of Home: House Home Access: Stairs to enter Entrance Stairs-Rails: Right Entrance Stairs-Number of Steps: 3 vs 7 Home Layout: One level Home Equipment: Wheelchair - [employer] - 2 wheels;Cane - single point;Shower seat;Grab bars - tub/shower;Grab bars - toilet      Prior Function Level of Independence: Independent with assistive device(s);Needs assistance   Gait / Transfers Assistance Needed: Uses SPC for household distances.   ADL's / Homemaking Assistance Needed: Reports performing ADLs independently for the most part. Does not perform IADLs - cooking/cleaning.         Hand Dominance   Dominant Hand: Right    Extremity/Trunk Assessment   Upper Extremity Assessment: Defer to OT evaluation           Lower Extremity Assessment: Generalized weakness         Communication   Communication: No difficulties  Cognition Arousal/Alertness: Awake/alert Behavior During Therapy: WFL for tasks assessed/performed Overall Cognitive Status: Within Functional Limits for tasks assessed  General Comments General comments (skin integrity, edema, etc.): Pt with visual deficits. Able to read clock on wall from a distance but difficulty reading white board. Can see shapes.     Exercises        Assessment/Plan    PT Assessment Patient needs continued PT services  PT Diagnosis Difficulty walking;Generalized weakness   PT Problem List Decreased strength;Decreased activity tolerance;Decreased balance;Decreased mobility  PT Treatment Interventions Gait training;Balance training;Stair training;Patient/family education;Functional mobility training;Therapeutic activities;Therapeutic exercise   PT Goals (Current goals can be found in the Care Plan section) Acute Rehab PT Goals Patient Stated Goal: to return home ASAP PT Goal Formulation: With patient Time For Goal Achievement: 09/21/14 Potential to Achieve Goals: Good    Frequency Min 3X/week   Barriers to discharge        Co-evaluation               End of Session Equipment Utilized During Treatment: Gait belt Activity Tolerance: Patient tolerated treatment well;Patient limited by fatigue Patient left: in bed;with call bell/phone within reach;with bed alarm set Nurse Communication: Mobility status    Functional Assessment Tool Used: Clinical judgment Functional Limitation: Mobility: Walking and moving around Mobility: Walking and Moving Around Current Status (Z6109): At least 20 percent but less than 40 percent impaired, limited or restricted Mobility: Walking and Moving Around Goal Status [phone]): At least 1 percent but less than 20 percent impaired, limited or restricted    Time: 1532-1550 PT Time Calculation (min) (ACUTE ONLY): 18 min   Charges:   PT Evaluation $Initial PT Evaluation Tier I: 1 Procedure     PT G Codes:   PT G-Codes **NOT FOR INPATIENT CLASS** Functional Assessment Tool Used: Clinical judgment Functional Limitation: Mobility: Walking and moving around Mobility: Walking and Moving Around Current Status (U9811): At least 20 percent but less than 40 percent impaired, limited or restricted Mobility: Walking and Moving Around Goal Status [phone]): At least 1 percent but less  than 20 percent impaired, limited or restricted    Alvie Heidelberg A 09/07/2014, 4:01 PM Alvie Heidelberg, PT, DPT [id number]

## 2014-09-07 NOTE — Progress Notes (Signed)
  Echocardiogram 2D Echocardiogram has been performed.  [name] 09/07/2014, 2:51 PM

## 2014-09-07 NOTE — Consult Note (Signed)
   Reason for Consult:  Syncope Referring Physician:   [name] is an 81 y.o. male.  HPI:   The patient is an 81yo male with  History of OSA, HLD, obesity, HTN, DM, macular degeneration.  No prior cardiac history.  He reports being at the drug store and feeling weak in the legs.  He went home and his wife was fixing dinner while he sat on the couch.  He became unresponsive for about ten minutes but did not loose a pulse.  He denies any dizziness, SOB, CP prior to the event.  No BM prior to passing out.  He did not just try to stand up.  BP was apparently in the 60's when EMS checked.  He had an episode 2-3 years ago and at that time he said the room started to spin.  Nothing acute on Head MRI or CT.  Pulmonary congestion on CXR.   EKG with RBBB which was there last year.     Past Medical History  Diagnosis Date  . Gout     takes Allopurinol daily  . Sleep apnea   . Hyperlipidemia     takes Pravastatin daily  . Gastric ulcer     50 Years ago  . Arthritis     All Joints  . Insomnia     uses OTC sleep aide  . Glaucoma     uses eye drops daily  . Morbid obesity   . Hypertension     takes Amlodipine and Losartan daily  . Pleurisy 20+yrs ago  . Joint pain   . Joint swelling     right knee   . Peripheral edema   . Chronic back pain   . Constipation     takes an OTC med as needed  . History of colon polyps   . Urinary frequency     takes Flomax daily  . Nocturia   . Macular degeneration   . Legally blind   . Shortness of breath   . Diabetes mellitus     takes Metformin daily    Past Surgical History  Procedure Laterality Date  . Cataract extraction w/ intraocular lens  implant, bilateral Bilateral   . Knee arthroscopy Right     1990'  . Back surgery    . Glaucoma surgery Bilateral   . Mini shunt insertion  09/26/2011    Procedure: INSERTION OF MINI SHUNT;  Surgeon: Roy Whitaker, MD;  Location: [facility] OR;  Service: Ophthalmology;  Laterality: Right;  Insertion of  Ahmed shunt  . Neurostimulator placed  about 3yrs ago  . Colonoscopy    . Shoulder surgery Right 09/22/2013    REVERSAL    DR NORRIS  . Reverse shoulder arthroplasty Right 09/21/2013    Procedure: RIGHT  SHOULDER ARTHROPLASTY REVERSE ;  Surgeon: Steven R Norris, MD;  Location: [facility] OR;  Service: Orthopedics;  Laterality: Right;  . Shoulder injection Left 09/21/2013    Procedure: SHOULDER INJECTION;  Surgeon: Steven R Norris, MD;  Location: [facility] OR;  Service: Orthopedics;  Laterality: Left;    Family History  Problem Relation Age of Onset  . Anesthesia problems Neg Hx     Social History:  reports that he has quit smoking. His smoking use included Cigarettes. He smoked 2.00 packs per day. He has never used smokeless tobacco. He reports that he drinks alcohol. He reports that he does not use illicit drugs.  Allergies: No Known Allergies  Medications:  Scheduled Meds: . sodium   chloride   Intravenous STAT  . amLODipine  5 mg Oral Daily  . aspirin EC  81 mg Oral Daily  . heparin  5,000 Units Subcutaneous 3 times per day  . insulin aspart  0-9 Units Subcutaneous 6 times per day  . potassium chloride  10 mEq Intravenous Q1 Hr x 2  . sodium chloride  3 mL Intravenous Q12H  . sodium chloride  3 mL Intravenous Q12H   Continuous Infusions:  PRN Meds:.   Results for orders placed or performed during the hospital encounter of 09/06/14 (from the past 48 hour(s))  CBC with Differential     Status: Abnormal   Collection Time: 09/06/14  8:41 PM  Result Value Ref Range   WBC 7.7 4.0 - 10.5 K/uL   RBC 4.83 4.22 - 5.81 MIL/uL   Hemoglobin 14.3 13.0 - 17.0 g/dL   HCT 43.0 39.0 - 52.0 %   MCV 89.0 78.0 - 100.0 fL   MCH 29.6 26.0 - 34.0 pg   MCHC 33.3 30.0 - 36.0 g/dL   RDW 13.4 11.5 - 15.5 %   Platelets 143 (L) 150 - 400 K/uL   Neutrophils Relative % 73 43 - 77 %   Neutro Abs 5.7 1.7 - 7.7 K/uL   Lymphocytes Relative 13 12 - 46 %   Lymphs Abs 1.0 0.7 - 4.0 K/uL   Monocytes Relative 9 3 - 12 %     Monocytes Absolute 0.7 0.1 - 1.0 K/uL   Eosinophils Relative 5 0 - 5 %   Eosinophils Absolute 0.4 0.0 - 0.7 K/uL   Basophils Relative 0 0 - 1 %   Basophils Absolute 0.0 0.0 - 0.1 K/uL  Comprehensive metabolic panel     Status: Abnormal   Collection Time: 09/06/14  8:41 PM  Result Value Ref Range   Sodium 138 135 - 145 mmol/L   Potassium 3.3 (L) 3.5 - 5.1 mmol/L   Chloride 106 96 - 112 mmol/L   CO2 25 19 - 32 mmol/L   Glucose, Bld 120 (H) 70 - 99 mg/dL   BUN 14 6 - 23 mg/dL   Creatinine, Ser 1.34 0.50 - 1.35 mg/dL   Calcium 8.7 8.4 - 10.5 mg/dL   Total Protein 6.2 6.0 - 8.3 g/dL   Albumin 3.5 3.5 - 5.2 g/dL   AST 16 0 - 37 U/L   ALT 12 0 - 53 U/L   Alkaline Phosphatase 69 39 - 117 U/L   Total Bilirubin 0.7 0.3 - 1.2 mg/dL   GFR calc non Af Amer 48 (L) >90 mL/min   GFR calc Af Amer 56 (L) >90 mL/min    Comment: (NOTE) The eGFR has been calculated using the CKD EPI equation. This calculation has not been validated in all clinical situations. eGFR's persistently <90 mL/min signify possible Chronic Kidney Disease.    Anion gap 7 5 - 15  Ammonia     Status: None   Collection Time: 09/06/14  8:41 PM  Result Value Ref Range   Ammonia 29 11 - 32 umol/L  I-Stat CG4 Lactic Acid, ED     Status: None   Collection Time: 09/06/14  8:48 PM  Result Value Ref Range   Lactic Acid, Venous 1.52 0.5 - 2.0 mmol/L  Urinalysis, Routine w reflex microscopic     Status: Abnormal   Collection Time: 09/06/14  8:59 PM  Result Value Ref Range   Color, Urine YELLOW YELLOW   APPearance CLOUDY (A) CLEAR   Specific   Gravity, Urine 1.016 1.005 - 1.030   pH 5.0 5.0 - 8.0   Glucose, UA NEGATIVE NEGATIVE mg/dL   Hgb urine dipstick NEGATIVE NEGATIVE   Bilirubin Urine NEGATIVE NEGATIVE   Ketones, ur NEGATIVE NEGATIVE mg/dL   Protein, ur NEGATIVE NEGATIVE mg/dL   Urobilinogen, UA 0.2 0.0 - 1.0 mg/dL   Nitrite NEGATIVE NEGATIVE   Leukocytes, UA NEGATIVE NEGATIVE    Comment: MICROSCOPIC NOT DONE ON  URINES WITH NEGATIVE PROTEIN, BLOOD, LEUKOCYTES, NITRITE, OR GLUCOSE <1000 mg/dL.  Drug screen panel, emergency     Status: None   Collection Time: 09/06/14  8:59 PM  Result Value Ref Range   Opiates NONE DETECTED NONE DETECTED   Cocaine NONE DETECTED NONE DETECTED   Benzodiazepines NONE DETECTED NONE DETECTED   Amphetamines NONE DETECTED NONE DETECTED   Tetrahydrocannabinol NONE DETECTED NONE DETECTED   Barbiturates NONE DETECTED NONE DETECTED    Comment:        DRUG SCREEN FOR MEDICAL PURPOSES ONLY.  IF CONFIRMATION IS NEEDED FOR ANY PURPOSE, NOTIFY LAB WITHIN 5 DAYS.        LOWEST DETECTABLE LIMITS FOR URINE DRUG SCREEN Drug Class       Cutoff (ng/mL) Amphetamine      1000 Barbiturate      200 Benzodiazepine   200 Tricyclics       300 Opiates          300 Cocaine          300 THC              50   I-Stat CG4 Lactic Acid, ED     Status: None   Collection Time: 09/06/14 11:48 PM  Result Value Ref Range   Lactic Acid, Venous 1.49 0.5 - 2.0 mmol/L  CBC     Status: Abnormal   Collection Time: 09/07/14  1:29 AM  Result Value Ref Range   WBC 5.8 4.0 - 10.5 K/uL   RBC 4.55 4.22 - 5.81 MIL/uL   Hemoglobin 13.4 13.0 - 17.0 g/dL   HCT 40.1 39.0 - 52.0 %   MCV 88.1 78.0 - 100.0 fL   MCH 29.5 26.0 - 34.0 pg   MCHC 33.4 30.0 - 36.0 g/dL   RDW 13.3 11.5 - 15.5 %   Platelets 138 (L) 150 - 400 K/uL  Creatinine, serum     Status: Abnormal   Collection Time: 09/07/14  1:29 AM  Result Value Ref Range   Creatinine, Ser 1.33 0.50 - 1.35 mg/dL   GFR calc non Af Amer 48 (L) >90 mL/min   GFR calc Af Amer 56 (L) >90 mL/min    Comment: (NOTE) The eGFR has been calculated using the CKD EPI equation. This calculation has not been validated in all clinical situations. eGFR's persistently <90 mL/min signify possible Chronic Kidney Disease.   TSH     Status: None   Collection Time: 09/07/14  1:29 AM  Result Value Ref Range   TSH 0.883 0.350 - 4.500 uIU/mL  Brain natriuretic peptide      Status: None   Collection Time: 09/07/14  1:29 AM  Result Value Ref Range   B Natriuretic Peptide 17.1 0.0 - 100.0 pg/mL  I-Stat arterial blood gas, ED     Status: Abnormal   Collection Time: 09/07/14  1:36 AM  Result Value Ref Range   pH, Arterial 7.355 7.350 - 7.450   pCO2 arterial 43.4 35.0 - 45.0 mmHg   pO2, Arterial 66.0 (L) 80.0 - 100.0   mmHg   Bicarbonate 24.3 (H) 20.0 - 24.0 mEq/L   TCO2 26 0 - 100 mmol/L   O2 Saturation 92.0 %   Acid-base deficit 1.0 0.0 - 2.0 mmol/L   Patient temperature 98.6 F    Collection site RADIAL, ALLEN'S TEST ACCEPTABLE    Drawn by RT    Sample type ARTERIAL   CBG monitoring, ED     Status: Abnormal   Collection Time: 09/07/14  1:53 AM  Result Value Ref Range   Glucose-Capillary 112 (H) 70 - 99 mg/dL  CBG monitoring, ED     Status: None   Collection Time: 09/07/14  4:37 AM  Result Value Ref Range   Glucose-Capillary 95 70 - 99 mg/dL  Comprehensive metabolic panel     Status: Abnormal   Collection Time: 09/07/14  5:13 AM  Result Value Ref Range   Sodium 137 135 - 145 mmol/L   Potassium 4.3 3.5 - 5.1 mmol/L    Comment: DELTA CHECK NOTED   Chloride 105 96 - 112 mmol/L   CO2 28 19 - 32 mmol/L   Glucose, Bld 122 (H) 70 - 99 mg/dL   BUN 14 6 - 23 mg/dL   Creatinine, Ser 1.26 0.50 - 1.35 mg/dL   Calcium 8.7 8.4 - 10.5 mg/dL   Total Protein 5.7 (L) 6.0 - 8.3 g/dL   Albumin 3.2 (L) 3.5 - 5.2 g/dL   AST 20 0 - 37 U/L   ALT 11 0 - 53 U/L   Alkaline Phosphatase 72 39 - 117 U/L   Total Bilirubin 1.1 0.3 - 1.2 mg/dL   GFR calc non Af Amer 52 (L) >90 mL/min   GFR calc Af Amer 60 (L) >90 mL/min    Comment: (NOTE) The eGFR has been calculated using the CKD EPI equation. This calculation has not been validated in all clinical situations. eGFR's persistently <90 mL/min signify possible Chronic Kidney Disease.    Anion gap 4 (L) 5 - 15  CBC WITH DIFFERENTIAL     Status: Abnormal   Collection Time: 09/07/14  5:13 AM  Result Value Ref Range   WBC  5.2 4.0 - 10.5 K/uL   RBC 4.67 4.22 - 5.81 MIL/uL   Hemoglobin 13.7 13.0 - 17.0 g/dL   HCT 41.3 39.0 - 52.0 %   MCV 88.4 78.0 - 100.0 fL   MCH 29.3 26.0 - 34.0 pg   MCHC 33.2 30.0 - 36.0 g/dL   RDW 13.4 11.5 - 15.5 %   Platelets 158 150 - 400 K/uL   Neutrophils Relative % 64 43 - 77 %   Neutro Abs 3.4 1.7 - 7.7 K/uL   Lymphocytes Relative 21 12 - 46 %   Lymphs Abs 1.1 0.7 - 4.0 K/uL   Monocytes Relative 8 3 - 12 %   Monocytes Absolute 0.4 0.1 - 1.0 K/uL   Eosinophils Relative 6 (H) 0 - 5 %   Eosinophils Absolute 0.3 0.0 - 0.7 K/uL   Basophils Relative 0 0 - 1 %   Basophils Absolute 0.0 0.0 - 0.1 K/uL  Magnesium     Status: None   Collection Time: 09/07/14  5:15 AM  Result Value Ref Range   Magnesium 1.9 1.5 - 2.5 mg/dL  CBG monitoring, ED     Status: None   Collection Time: 09/07/14  7:25 AM  Result Value Ref Range   Glucose-Capillary 96 70 - 99 mg/dL  Troponin I     Status: None   Collection Time:  09/07/14  9:22 AM  Result Value Ref Range   Troponin I <0.03 <0.031 ng/mL    Comment:        NO INDICATION OF MYOCARDIAL INJURY.   CBG monitoring, ED     Status: Abnormal   Collection Time: 09/07/14 12:28 PM  Result Value Ref Range   Glucose-Capillary 118 (H) 70 - 99 mg/dL    Dg Chest 2 View  09/07/2014   CLINICAL DATA:  Encephalopathy.  Patient fell out today at home.  EXAM: CHEST  2 VIEW  COMPARISON:  09/17/2013  FINDINGS: Cardiac enlargement with increasing pulmonary vascularity suggesting congestion. Atelectasis or infiltration in the lung bases. Hazy perihilar opacity may represent early edema. No blunting of costophrenic angles. No pneumothorax. Calcification of the aorta. Postoperative changes in the right shoulder. Degenerative changes in the spine and left shoulder.  IMPRESSION: Cardiac enlargement with developing pulmonary vascular congestion and mild edema. Shallow inspiration with atelectasis in the lung bases.   Electronically Signed   By: Lucienne Capers M.D.   On:  09/07/2014 02:19   Ct Head Wo Contrast  09/06/2014   CLINICAL DATA:  Hypotension and loss of consciousness. 3 syncopal episodes.  EXAM: CT HEAD WITHOUT CONTRAST  TECHNIQUE: Contiguous axial images were obtained from the base of the skull through the vertex without intravenous contrast.  COMPARISON:  None.  FINDINGS: Diffuse cerebral atrophy. Mild ventricular dilatation consistent with central atrophy. Patchy low-attenuation changes in the deep white matter consistent small vessel ischemic changes. Multiple old lacunar infarcts in the deep white matter particularly on the left. No mass effect or midline shift. No abnormal extra-axial fluid collections. Gray-white matter junctions are distinct. Basal cisterns are not effaced. No evidence of acute intracranial hemorrhage. No depressed skull fractures. Visualized paranasal sinuses and mastoid air cells are not opacified. Vascular calcifications.  IMPRESSION: No acute intracranial abnormalities. Chronic atrophy and small vessel ischemic changes.   Electronically Signed   By: Lucienne Capers M.D.   On: 09/06/2014 23:32   Mr Brain Wo Contrast  09/07/2014   CLINICAL DATA:  Initial evaluation for acute encephalopathy, syncope.  EXAM: MRI HEAD WITHOUT CONTRAST  TECHNIQUE: Multiplanar, multiecho pulse sequences of the brain and surrounding structures were obtained without intravenous contrast.  COMPARISON:  Prior CT from 09/06/2014  FINDINGS: Diffuse prominence of the CSF containing spaces is compatible with generalized cerebral atrophy. Patchy and confluent T2/FLAIR hyperintensity within the periventricular and deep white matter both cerebral hemispheres most consistent with chronic small vessel ischemic disease, fairly mild for patient age. Small vessel type changes present within the pons as well. There are remote lacunar infarcts within the left basal ganglia.  No abnormal foci of restricted diffusion to suggest acute intracranial infarct. Gray-white matter  differentiation maintained. Normal intravascular flow voids preserved. No acute or chronic intracranial hemorrhage.  No mass lesion or midline shift. No mass effect. No hydrocephalus. No extra-axial fluid collection.  Craniocervical junction within normal limits. Degenerative changes noted within the visualized upper cervical spine. Pituitary gland normal. No acute abnormality about the orbits.  Scattered mucoperiosteal thickening present within the maxillary sinuses and ethmoidal air cells. Minimal scattered fluid signal intensity noted within the left mastoid air cells. Inner ear structures normal.  Bone marrow signal intensity within normal limits. Scalp soft tissues unremarkable.  IMPRESSION: 1. No acute intracranial infarct or other abnormality identified. 2. Generalized age-related cerebral atrophy with mild chronic small vessel ischemic disease. 3. Small remote lacunar infarcts involving the left basal ganglia.   Electronically Signed  By: Jeannine Boga M.D.   On: 09/07/2014 04:17    Review of Systems  Constitutional: Negative for fever and diaphoresis.  HENT: Negative for congestion and sore throat.   Respiratory: Negative for cough and shortness of breath.   Cardiovascular: Positive for orthopnea (2 pillow). Negative for chest pain, palpitations, leg swelling and PND.  Gastrointestinal: Negative for nausea, vomiting and abdominal pain.  Musculoskeletal: Negative for myalgias.  Neurological: Negative for dizziness.  All other systems reviewed and are negative.  Blood pressure 172/82, pulse 99, temperature 98.3 F (36.8 C), temperature source Oral, resp. rate 22, height 5' 10" (1.778 m), weight 232 lb (105.235 kg), SpO2 95 %. Physical Exam  Nursing note and vitals reviewed. Constitutional: He is oriented to person, place, and time. He appears well-developed and well-nourished. No distress.  HENT:  Head: Normocephalic and atraumatic.  Mouth/Throat: No oropharyngeal exudate.  Eyes:  EOM are normal. No scleral icterus.  Neck: Normal range of motion. Neck supple. No JVD present.  Cardiovascular: Normal rate, regular rhythm, S1 normal and S2 normal.   Pulses:      Radial pulses are 2+ on the right side, and 2+ on the left side.       Posterior tibial pulses are 2+ on the right side, and 2+ on the left side.  Respiratory: Effort normal. He has no wheezes. He has no rales.  BS decreased bilaterally.     GI: Soft. Bowel sounds are normal. He exhibits no distension. There is no tenderness.  Musculoskeletal: He exhibits edema.  Trace left LEE  Lymphadenopathy:    He has no cervical adenopathy.  Neurological: He is alert and oriented to person, place, and time. He exhibits normal muscle tone.  Skin: Skin is warm and dry.  Psychiatric: He has a normal mood and affect.    Assessment/Plan: Active Problems:   Syncope   Encephalopathy   RBBB  DM   HTN    Plan:   We will review echo when completed.  Lexiscan cardiolite tomorrow.  If nothing presents on telemetry, then we'll order a 30 day HR monitor.  Diastolic pressure did drop 19 mmHg when standing.   He appears euvolemic despite appearance of CXR.    Tarri Fuller, PA-C 09/07/2014, 1:52 PM   I have seen and examined the patient along with HAGER, BRYAN, PA-C, agree with assessment and plan. Sanda Klein, MD, [place] [phone] 09/08/2014, 2:23 PM
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
## 2014-09-07 NOTE — ED Notes (Signed)
Please call Karder Swasey, wife when room assigned [phone]

## 2014-09-07 NOTE — Progress Notes (Signed)
UR completed 

## 2014-09-07 NOTE — ED Notes (Signed)
CBG 112.  

## 2014-09-07 NOTE — ED Notes (Signed)
Patient transported to MRI 

## 2014-09-08 ENCOUNTER — Observation Stay (HOSPITAL_COMMUNITY): Payer: Commercial Managed Care - HMO

## 2014-09-08 DIAGNOSIS — H409 Unspecified glaucoma: Secondary | ICD-10-CM | POA: Diagnosis not present

## 2014-09-08 DIAGNOSIS — I10 Essential (primary) hypertension: Secondary | ICD-10-CM | POA: Diagnosis present

## 2014-09-08 DIAGNOSIS — R55 Syncope and collapse: Secondary | ICD-10-CM | POA: Diagnosis not present

## 2014-09-08 DIAGNOSIS — M1990 Unspecified osteoarthritis, unspecified site: Secondary | ICD-10-CM | POA: Diagnosis not present

## 2014-09-08 DIAGNOSIS — E785 Hyperlipidemia, unspecified: Secondary | ICD-10-CM | POA: Diagnosis not present

## 2014-09-08 DIAGNOSIS — E119 Type 2 diabetes mellitus without complications: Secondary | ICD-10-CM

## 2014-09-08 DIAGNOSIS — G4700 Insomnia, unspecified: Secondary | ICD-10-CM | POA: Diagnosis not present

## 2014-09-08 DIAGNOSIS — E860 Dehydration: Secondary | ICD-10-CM | POA: Diagnosis not present

## 2014-09-08 DIAGNOSIS — M109 Gout, unspecified: Secondary | ICD-10-CM | POA: Diagnosis not present

## 2014-09-08 DIAGNOSIS — G9340 Encephalopathy, unspecified: Secondary | ICD-10-CM | POA: Diagnosis not present

## 2014-09-08 DIAGNOSIS — G4730 Sleep apnea, unspecified: Secondary | ICD-10-CM | POA: Diagnosis not present

## 2014-09-08 LAB — CBC WITH DIFFERENTIAL/PLATELET
Basophils Absolute: 0 10*3/uL (ref 0.0–0.1)
Basophils Relative: 0 % (ref 0–1)
Eosinophils Absolute: 0.4 10*3/uL (ref 0.0–0.7)
Eosinophils Relative: 7 % — ABNORMAL HIGH (ref 0–5)
HCT: 43.3 % (ref 39.0–52.0)
Hemoglobin: 14.7 g/dL (ref 13.0–17.0)
Lymphocytes Relative: 18 % (ref 12–46)
Lymphs Abs: 1 10*3/uL (ref 0.7–4.0)
MCH: 29.9 pg (ref 26.0–34.0)
MCHC: 33.9 g/dL (ref 30.0–36.0)
MCV: 88 fL (ref 78.0–100.0)
Monocytes Absolute: 0.6 10*3/uL (ref 0.1–1.0)
Monocytes Relative: 10 % (ref 3–12)
Neutro Abs: 3.8 10*3/uL (ref 1.7–7.7)
Neutrophils Relative %: 65 % (ref 43–77)
Platelets: 156 10*3/uL (ref 150–400)
RBC: 4.92 MIL/uL (ref 4.22–5.81)
RDW: 13.2 % (ref 11.5–15.5)
WBC: 5.7 10*3/uL (ref 4.0–10.5)

## 2014-09-08 LAB — COMPREHENSIVE METABOLIC PANEL
ALT: 12 U/L (ref 0–53)
AST: 16 U/L (ref 0–37)
Albumin: 3.5 g/dL (ref 3.5–5.2)
Alkaline Phosphatase: 76 U/L (ref 39–117)
Anion gap: 9 (ref 5–15)
BUN: 10 mg/dL (ref 6–23)
CO2: 26 mmol/L (ref 19–32)
Calcium: 9 mg/dL (ref 8.4–10.5)
Chloride: 105 mmol/L (ref 96–112)
Creatinine, Ser: 1.04 mg/dL (ref 0.50–1.35)
GFR calc Af Amer: 76 mL/min — ABNORMAL LOW (ref >90)
GFR calc non Af Amer: 65 mL/min — ABNORMAL LOW (ref >90)
Glucose, Bld: 112 mg/dL — ABNORMAL HIGH (ref 70–99)
Potassium: 3.8 mmol/L (ref 3.5–5.1)
Sodium: 140 mmol/L (ref 135–145)
Total Bilirubin: 0.9 mg/dL (ref 0.3–1.2)
Total Protein: 6.6 g/dL (ref 6.0–8.3)

## 2014-09-08 LAB — HEMOGLOBIN A1C
Hgb A1c MFr Bld: 6.3 % — ABNORMAL HIGH (ref 4.8–5.6)
Mean Plasma Glucose: 134 mg/dL

## 2014-09-08 LAB — GLUCOSE, CAPILLARY: Glucose-Capillary: 117 mg/dL — ABNORMAL HIGH (ref 70–99)

## 2014-09-08 LAB — URINE CULTURE: Colony Count: >=100000

## 2014-09-08 MED ORDER — REGADENOSON 0.4 MG/5ML IV SOLN
INTRAVENOUS | Status: AC
  Administered 2014-09-08: 0.4 mg via INTRAVENOUS
  Filled 2014-09-08: qty 5

## 2014-09-08 MED ORDER — TECHNETIUM TC 99M SESTAMIBI GENERIC - CARDIOLITE
10.0000 | Freq: Once | INTRAVENOUS | Status: AC | PRN
  Administered 2014-09-08: 10 via INTRAVENOUS

## 2014-09-08 MED ORDER — TECHNETIUM TC 99M SESTAMIBI GENERIC - CARDIOLITE
30.0000 | Freq: Once | INTRAVENOUS | Status: AC | PRN
  Administered 2014-09-08: 30 via INTRAVENOUS

## 2014-09-08 MED ORDER — REGADENOSON 0.4 MG/5ML IV SOLN
0.4000 mg | Freq: Once | INTRAVENOUS | Status: AC
  Administered 2014-09-08: 0.4 mg via INTRAVENOUS
  Filled 2014-09-08: qty 5

## 2014-09-08 NOTE — Care Management Note (Signed)
    Page 1 of 1   09/08/2014     4:19:17 PM CARE MANAGEMENT NOTE 09/08/2014  Patient:  [name], [name]   Account Number:  [id number]  Date Initiated:  09/08/2014  Documentation initiated by:  GRAVES-BIGELOW,Kris Burd  Subjective/Objective Assessment:   Pt admitted for syncope- legally blind. has support of wife.     Action/Plan:   CM did speak with wife in regards to home care. Per wife she just got out of hospital and has therapy via [facility]. CM did make referral with [facility]. SOC to begin within 24-48 hrs.   Anticipated DC Date:  09/08/2014   Anticipated DC Plan:  HOME W HOME HEALTH SERVICES      DC Planning Services  CM consult      [facility] Choice  HOME HEALTH   Choice offered to / List presented to:  C-3 Spouse        HH arranged  HH-1 RN  HH-10 DISEASE MANAGEMENT  HH-2 PT  HH-3 OT  HH-4 NURSE'S AIDE  HH-6 SOCIAL WORKER      HH agency  [facility]   Status of service:  Completed, signed off Medicare Important Message given?  NO (If response is "NO", the following Medicare IM given date fields will be blank) Date Medicare IM given:   Medicare IM given by:   Date Additional Medicare IM given:   Additional Medicare IM given by:    Discharge Disposition:  HOME W HOME HEALTH SERVICES  Per UR Regulation:  Reviewed for med. necessity/level of care/duration of stay  If discussed at Long Length of Stay Meetings, dates discussed:    Comments:

## 2014-09-08 NOTE — Evaluation (Signed)
Occupational Therapy Evaluation Patient Details Name: [name] MRN: [id number] DOB: 1931/03/30 Today's Date: 09/08/2014    History of Present Illness Patient is a 81 y/o male with PMH of type 2 DM, morbid obesity, OSA, macular degeneration and legal blindness presents with encephalopathy and witnessed syncope x2. EKG shows PACs with bundle branch block/right anterior vascular block. Pulmonary congestion on CXR.   Clinical Impression   PTA pt lived at home with his spouse who assisted with ADLs as needed, however pt reports independence with use of SPC. Pt not agreeable to OOB however requested assistance with meal setup and repositioning. Pt is limited by generalized weakness and low vision and would recommend HHOT at d/c to facilitate functional environment at home. Pt will benefit from acute OT to promote independence with ADLs and functional mobility.     Follow Up Recommendations  Home health OT;Supervision/Assistance - 24 hour    Equipment Recommendations  None recommended by OT    Recommendations for Other Services       Precautions / Restrictions Precautions Precautions: Fall Restrictions Weight Bearing Restrictions: No      Mobility Bed Mobility Overal bed mobility: Modified Independent                Transfers                 General transfer comment: Pt declined at this time. Per PT note, pt at [facility] guard level for functional mobility on 4/5         ADL Overall ADL's : Needs assistance/impaired Eating/Feeding: Set up;Sitting Eating/Feeding Details (indicate cue type and reason): pt required assist to setup his food by cutting up his salad and opening containers. He reports his wife does this for him at home.  Grooming: Set up;Wash/dry hands;Wash/dry face;Sitting   Upper Body Bathing: Set up;Sitting       Upper Body Dressing : Set up;Sitting                     General ADL Comments: Pt was agreeable to sit EOB with OT, but  declined OOB activities today stating "I've had a miserable day and I'm just about done." Pt requested assistance with his meal reporting that he had difficulty seeing it, however was able to read clock on the wall. Suspect pt has difficulty with low contrast vision. Educated pt on use of high contrast at home to assist with low vision, which pt reports he is "still adjusting to."     Vision Additional Comments: Pt is legally blind at baseline and has difficulty with low contrast, however able to read wall clock with high contrast.           Pertinent Vitals/Pain Pain Assessment: No/denies pain     Hand Dominance Right   Extremity/Trunk Assessment Upper Extremity Assessment Upper Extremity Assessment: Generalized weakness   Lower Extremity Assessment Lower Extremity Assessment: Generalized weakness   Cervical / Trunk Assessment Cervical / Trunk Assessment: Normal   Communication Communication Communication: No difficulties   Cognition Arousal/Alertness: Awake/alert Behavior During Therapy: WFL for tasks assessed/performed Overall Cognitive Status: Within Functional Limits for tasks assessed                                Home Living Family/patient expects to be discharged to:: Private residence Living Arrangements: Spouse/significant other Available Help at Discharge: Family;Available 24 hours/day Type of Home: House Home Access: Stairs to  enter Entrance Stairs-Number of Steps: 3 vs 7 Entrance Stairs-Rails: Right Home Layout: One level     Bathroom Shower/Tub: Producer, television/film/video: Handicapped height     Home Equipment: Wheelchair - [employer] - 2 wheels;Cane - single point;Shower seat;Grab bars - tub/shower;Grab bars - toilet          Prior Functioning/Environment Level of Independence: Needs assistance  Gait / Transfers Assistance Needed: Uses SPC for household distances.  ADL's / Homemaking Assistance Needed: Reports performing  ADLs independently for the most part. Does not perform IADLs - cooking/cleaning.    Comments: Pt's daughters assist with driving to appointments and community errands. Grandson assists with cleaning the house. Pt's wife heart problems and cannot physically help pt much    OT Diagnosis: Generalized weakness;Disturbance of vision   OT Problem List: Decreased strength;Decreased activity tolerance;Impaired vision/perception   OT Treatment/Interventions: Self-care/ADL training;Therapeutic exercise;Neuromuscular education;Energy conservation;DME and/or AE instruction;Therapeutic activities;Visual/perceptual remediation/compensation;Patient/family education;Balance training    OT Goals(Current goals can be found in the care plan section) Acute Rehab OT Goals Patient Stated Goal: "home today I hope" OT Goal Formulation: With patient Time For Goal Achievement: 09/22/14 Potential to Achieve Goals: Good ADL Goals Pt Will Perform Lower Body Bathing: with supervision;sit to/from stand Pt Will Perform Lower Body Dressing: with supervision;sit to/from stand Pt Will Transfer to Toilet: with supervision;ambulating Pt Will Perform Toileting - Clothing Manipulation and hygiene: with supervision;sit to/from stand  OT Frequency: Min 2X/week              End of Session  Activity Tolerance: Patient limited by fatigue Patient left: in bed;with call bell/phone within reach;with bed alarm set   Time: 1423-1440 OT Time Calculation (min): 17 min Charges:  OT General Charges $OT Visit: 1 Procedure OT Evaluation $Initial OT Evaluation Tier I: 1 Procedure G-Codes: OT G-codes **NOT FOR INPATIENT CLASS** Functional Assessment Tool Used: clinical judgement Functional Limitation: Self care Self Care Current Status (Z6109): At least 20 percent but less than 40 percent impaired, limited or restricted Self Care Goal Status (U0454): At least 1 percent but less than 20 percent impaired, limited or restricted   Rae Lips 09/08/2014, 2:50 PM  Carney Living, OTR/L Occupational Therapist [phone] (pager)

## 2014-09-08 NOTE — Progress Notes (Signed)
Patient Name: [name] Date of Encounter: 09/08/2014  Active Problems:   Syncope   Encephalopathy   Length of Stay:   SUBJECTIVE  No more events today. Yesterday's symptoms sound most consistent with hypotension. Echo is normal and no  CURRENT MEDS . amLODipine  5 mg Oral Daily  . aspirin EC  81 mg Oral Daily  . heparin  5,000 Units Subcutaneous 3 times per day  . insulin aspart  0-9 Units Subcutaneous 6 times per day  . sodium chloride  3 mL Intravenous Q12H  . sodium chloride  3 mL Intravenous Q12H    OBJECTIVE   Intake/Output Summary (Last 24 hours) at 09/08/14 1424 Last data filed at 09/08/14 1122  Gross per 24 hour  Intake      0 ml  Output   2550 ml  Net  -2550 ml   Filed Weights   09/06/14 1953 09/08/14 0400  Weight: 232 lb (105.235 kg) 230 lb (104.327 kg)    PHYSICAL EXAM Filed Vitals:   09/08/14 1156 09/08/14 1158 09/08/14 1200 09/08/14 1309  BP: 152/92 167/85 159/83 163/93  Pulse: 85 95 90 83  Temp:    99.3 F (37.4 C)  TempSrc:    Oral  Resp:    17  Height:      Weight:      SpO2:    98%   General: Alert, oriented x3, no distress Head: no evidence of trauma, PERRL, EOMI, no exophtalmos or lid lag, no myxedema, no xanthelasma; normal ears, nose and oropharynx Neck: normal jugular venous pulsations and no hepatojugular reflux; brisk carotid pulses without delay and no carotid bruits Chest: clear to auscultation, no signs of consolidation by percussion or palpation, normal fremitus, symmetrical and full respiratory excursions Cardiovascular: normal position and quality of the apical impulse, regular rhythm, normal first and second heart sounds, no rubs or gallops, no murmur Abdomen: no tenderness or distention, no masses by palpation, no abnormal pulsatility or arterial bruits, normal bowel sounds, no hepatosplenomegaly Extremities: no clubbing, cyanosis or edema; 2+ radial, ulnar and brachial pulses bilaterally; 2+ right femoral, posterior  tibial and dorsalis pedis pulses; 2+ left femoral, posterior tibial and dorsalis pedis pulses; no subclavian or femoral bruits Neurological: grossly nonfocal  LABS  CBC  Recent Labs  09/07/14 0513 09/08/14 0341  WBC 5.2 5.7  NEUTROABS 3.4 3.8  HGB 13.7 14.7  HCT 41.3 43.3  MCV 88.4 88.0  PLT 158 156   Basic Metabolic Panel  Recent Labs  09/07/14 0513 09/07/14 0515 09/08/14 0341  NA 137  --  140  K 4.3  --  3.8  CL 105  --  105  CO2 28  --  26  GLUCOSE 122*  --  112*  BUN 14  --  10  CREATININE 1.26  --  1.04  CALCIUM 8.7  --  9.0  MG  --  1.9  --    Liver Function Tests  Recent Labs  09/07/14 0513 09/08/14 0341  AST 20 16  ALT 11 12  ALKPHOS 72 76  BILITOT 1.1 0.9  PROT 5.7* 6.6  ALBUMIN 3.2* 3.5   No results for input(s): LIPASE, AMYLASE in the last 72 hours. Cardiac Enzymes  Recent Labs  09/07/14 0922 09/07/14 1645 09/07/14 2033  TROPONINI <0.03 <0.03 <0.03   BNP Invalid input(s): POCBNP D-Dimer No results for input(s): DDIMER in the last 72 hours. Hemoglobin A1C  Recent Labs  09/07/14 0129  HGBA1C 6.3*   Fasting Lipid  Panel No results for input(s): CHOL, HDL, LDLCALC, TRIG, CHOLHDL, LDLDIRECT in the last 72 hours. Thyroid Function Tests  Recent Labs  09/07/14 0129  TSH 0.883    Radiology Studies Imaging results have been reviewed and Dg Chest 2 View  09/07/2014   CLINICAL DATA:  Encephalopathy.  Patient fell out today at home.  EXAM: CHEST  2 VIEW  COMPARISON:  09/17/2013  FINDINGS: Cardiac enlargement with increasing pulmonary vascularity suggesting congestion. Atelectasis or infiltration in the lung bases. Hazy perihilar opacity may represent early edema. No blunting of costophrenic angles. No pneumothorax. Calcification of the aorta. Postoperative changes in the right shoulder. Degenerative changes in the spine and left shoulder.  IMPRESSION: Cardiac enlargement with developing pulmonary vascular congestion and mild edema. Shallow  inspiration with atelectasis in the lung bases.   Electronically Signed   By: Burman Nieves M.D.   On: 09/07/2014 02:19   Ct Head Wo Contrast  09/06/2014   CLINICAL DATA:  Hypotension and loss of consciousness. 3 syncopal episodes.  EXAM: CT HEAD WITHOUT CONTRAST  TECHNIQUE: Contiguous axial images were obtained from the base of the skull through the vertex without intravenous contrast.  COMPARISON:  None.  FINDINGS: Diffuse cerebral atrophy. Mild ventricular dilatation consistent with central atrophy. Patchy low-attenuation changes in the deep white matter consistent small vessel ischemic changes. Multiple old lacunar infarcts in the deep white matter particularly on the left. No mass effect or midline shift. No abnormal extra-axial fluid collections. Gray-white matter junctions are distinct. Basal cisterns are not effaced. No evidence of acute intracranial hemorrhage. No depressed skull fractures. Visualized paranasal sinuses and mastoid air cells are not opacified. Vascular calcifications.  IMPRESSION: No acute intracranial abnormalities. Chronic atrophy and small vessel ischemic changes.   Electronically Signed   By: Burman Nieves M.D.   On: 09/06/2014 23:32   Mr Brain Wo Contrast  09/07/2014   CLINICAL DATA:  Initial evaluation for acute encephalopathy, syncope.  EXAM: MRI HEAD WITHOUT CONTRAST  TECHNIQUE: Multiplanar, multiecho pulse sequences of the brain and surrounding structures were obtained without intravenous contrast.  COMPARISON:  Prior CT from 09/06/2014  FINDINGS: Diffuse prominence of the CSF containing spaces is compatible with generalized cerebral atrophy. Patchy and confluent T2/FLAIR hyperintensity within the periventricular and deep white matter both cerebral hemispheres most consistent with chronic small vessel ischemic disease, fairly mild for patient age. Small vessel type changes present within the pons as well. There are remote lacunar infarcts within the left basal ganglia.  No  abnormal foci of restricted diffusion to suggest acute intracranial infarct. Gray-white matter differentiation maintained. Normal intravascular flow voids preserved. No acute or chronic intracranial hemorrhage.  No mass lesion or midline shift. No mass effect. No hydrocephalus. No extra-axial fluid collection.  Craniocervical junction within normal limits. Degenerative changes noted within the visualized upper cervical spine. Pituitary gland normal. No acute abnormality about the orbits.  Scattered mucoperiosteal thickening present within the maxillary sinuses and ethmoidal air cells. Minimal scattered fluid signal intensity noted within the left mastoid air cells. Inner ear structures normal.  Bone marrow signal intensity within normal limits. Scalp soft tissues unremarkable.  IMPRESSION: 1. No acute intracranial infarct or other abnormality identified. 2. Generalized age-related cerebral atrophy with mild chronic small vessel ischemic disease. 3. Small remote lacunar infarcts involving the left basal ganglia.   Electronically Signed   By: Rise Mu M.D.   On: 09/07/2014 04:17    TELE Lots of artifact on monitor, no true arrhythmia is seen  ECG  NSR, old RBBB  ECHO Left ventricle: The cavity size was normal. There was mild focal basal hypertrophy of the septum. Systolic function was normal. The estimated ejection fraction was in the range of 55% to 60%. Wall motion was normal; there were no regional wall motion abnormalities. There was an increased relative contribution of atrial contraction to ventricular filling. Doppler parameters are consistent with abnormal left ventricular relaxation (grade 1 diastolic dysfunction). - Aortic valve: Mildly calcified annulus. Trileaflet; normal thickness leaflets. - Pulmonary arteries: PA peak pressure: 35 mm Hg (S)  ASSESSMENT AND PLAN  Awaiting results of nuclear scan. If normal (no structural heart disease by noninvasive  studies), can continue outpatient work-up with a 30 day arrhythmia monitor. Suspect orthostatic hypotension was the culprit. Ideally, stop his alpha blocker (Flomax); if not possible - DC losartan.   Thurmon Fair, MD, [facility] [phone] office [phone] pager 09/08/2014 2:24 PM

## 2014-09-08 NOTE — Progress Notes (Signed)
PT Cancellation Note  Patient Details Name: [name] MRN: [id number] DOB: Jun 20, 1930   Cancelled Treatment:    Reason Eval/Treat Not Completed: Patient declined, no reason specified Patient declines to work with therapy. States he has "been through enough today." Explained this may be the last opportunity to work with therapy depending on his d/c date and time. Continues to decline. Will follow up as time allows.  [name] 09/08/2014, 2:19 PM [name] [place], [place] [phone]

## 2014-09-08 NOTE — Discharge Summary (Signed)
Physician Discharge Summary  [name] WJX:[id number] DOB: July 18, 1930 DOA: 09/06/2014  PCP: [name], MD  Admit date: 09/06/2014 Discharge date: 09/08/2014  Time spent: 65 minutes  Recommendations for Outpatient Follow-up:  1. Patient will be called to be scheduled for 30 day event monitor by cardiology as outpatient. 2. Follow-up with [name], MD in 1 week. On follow-up patient in need a basic metabolic profile done to follow-up on a lecture lites and renal function. Patient blood pressure needs to be reassessed as patient's Cozaar has been discontinued on discharge. 3. Follow-up with Dr. [name], of cardiology in 2-3 weeks.  Discharge Diagnoses:  Principal Problem:   Syncope and collapse Active Problems:   Syncope   Hyperlipidemia   Morbid obesity   Encephalopathy   Type 2 diabetes mellitus without complication   HTN (hypertension), benign   Discharge Condition: Stable and improved  Diet recommendation: Carb modified  Filed Weights   09/06/14 1953 09/08/14 0400  Weight: 105.235 kg (232 lb) 104.327 kg (230 lb)    History of present illness:  This is a 81 y.o. year old male with significant past medical history of type 2 DM, morbid obesity, OSA, macular degeneration, legal blindness presented with encephalopathy/syncope. Per family, pt was in otherwise normal state of health. Pt was on couch, pt's son prepared a meal and pt was noted be unconscious on recheck. Pt minimally arousable apart from vigorous shaking and a slight by his wife. Patient then was awake and was alert per family. EMS arrived with patient had another episode of unconsciousness where blood pressure was noted to be in the 60s. Family denied any recent infections. Patient is on narcotics chronically. However, family denied any recent excess use. Patient has had decreased by mouth intake recently. Presented to the ER temperature 98.3, heart rate in the 90s, respirations and intense 20s, blood  pressure in the 110s, satting 94% on room air. Received 1 L normal saline bolus. CBC and BMP within normal limits apart from a potassium of 3.3. Head CT within normal limits. Patient was somnolent at the bedside. Family stated this was not patient's baseline.  Hospital Course:  #1 syncope: Cardiogenic etiology/arrhythmia versus TIA Patient was admitted with a syncopal episode. Patient was noted to be hypotensive. EMS report and noted to be initially orthostatic. CT of the head which was obtained was negative for any acute abnormalities MRI of the head which was also obtained was negative for any acute abnormalities EKG which was done showed PACs and right anterior fascicular bundle branch block. Cardiology was consulted and followed the patient throughout the hospitalization. ABG which was obtained showed a mild hypoxia. Cardiac enzymes which were done were negative 3. 2-D echo which was obtained showed a EF of 55-60% with no wall motion abnormalities. Patient's Cozaar was discontinued and patient was given some IV fluids repeat orthostasis which was obtained was negative. Patient underwent a Myoview stress test which showed no reversible ischemia or infraction. Normal left ventricular wall motion. EF of 53%. Low risk stress test findings. Patient did not have any further episodes during the hospitalization. Patient be discharged home obesity setup with a 30 day event monitor. Patient will follow-up with PCP and cardiology as outpatient. Patient's Cozaar has been discontinued.  #2 well-controlled type 2 diabetes mellitus Patient's oral hypoglycemic agents were held. Patient was placed on a sliding scale insulin. Hemoglobin A1c came back at 6.3.  #3 hypertension Patient's cozaar was held during the hospitalization initially secondary to orthostasis. Norvasc was subsequently resumed.  Patient is also noted to be on Flomax. Patient's Cozaar has been discontinued secondary to concerns for orthostasis causing  his syncopal episode. Patient will follow-up with PCP as outpatient.  Procedures:  Myoview stress test 09/08/2014  2-D echo 09/07/2014  Chest x-ray 09/07/2014  CT at 09/06/2014  MRI of the head 09/07/2014  Consultations:  Cardiology: Dr. 09/07/2014  Discharge Exam: Filed Vitals:   09/08/14 1309  BP: 163/93  Pulse: 83  Temp: 99.3 F (37.4 C)  Resp: 17    General: NAD Cardiovascular: RRR Respiratory: CTAB  Discharge Instructions   Discharge Instructions    Diet Carb Modified    Complete by:  As directed      Discharge instructions    Complete by:  As directed   Follow up with [name], MD in 1 week.     Increase activity slowly    Complete by:  As directed           Current Discharge Medication List    CONTINUE these medications which have NOT CHANGED   Details  allopurinol (ZYLOPRIM) 300 MG tablet Take 300 mg by mouth daily.    gabapentin (NEURONTIN) 300 MG capsule Take 1 capsule (300 mg total) by mouth 3 (three) times daily. Qty: 90 capsule, Refills: 2    metFORMIN (GLUCOPHAGE) 1000 MG tablet Take 1,000 mg by mouth 2 (two) times daily with a meal.    oxyCODONE (OXY IR/ROXICODONE) 5 MG immediate release tablet Take 5 mg by mouth every 4 (four) hours as needed for severe pain.    pravastatin (PRAVACHOL) 40 MG tablet Take 40 mg by mouth daily.    VOLTAREN 1 % GEL Apply 2 g topically daily as needed.     amLODipine (NORVASC) 5 MG tablet Take 1 tablet (5 mg total) by mouth daily. Qty: 30 tablet, Refills: 0    Brinzolamide-Brimonidine (SIMBRINZA) 1-0.2 % SUSP Place 1 drop into the left eye 3 (three) times daily.     dorzolamide (TRUSOPT) 2 % ophthalmic solution Place 1 drop into the right eye 2 (two) times daily.     fluorometholone (FML) 0.1 % ophthalmic suspension Place 1 drop into both eyes 2 (two) times daily.  Refills: 6    ketoconazole (NIZORAL) 2 % cream Apply 1 application topically 2 (two) times daily as needed for irritation.      Polyethyl Glycol-Propyl Glycol (SYSTANE OP) Place 1 drop into the left eye 3 (three) times daily.    Tamsulosin HCl (FLOMAX) 0.4 MG CAPS Take 0.4 mg by mouth daily.    TRAVATAN Z 0.004 % SOLN ophthalmic solution Place 1 drop into both eyes at bedtime.  Refills: 6      STOP taking these medications     losartan (COZAAR) 25 MG tablet      oxyCODONE-acetaminophen (ROXICET) 5-325 MG per tablet        No Known Allergies Follow-up Information    Follow up with [facility].   Why:  Registered Nurse, Aide, Social Worker, Physical and Occupational Therapy   Contact information:   [address] [place] [address] [phone]       Follow up with [name], MD. Schedule an appointment as soon as possible for a visit in 1 week.   Specialty:  Family Medicine   Contact information:   [address] [place] [address] [phone]       Follow up with Thurmon Fair, MD. Schedule an appointment as soon as possible for a visit  in 2 weeks.   Specialty:  Cardiology   Why:  f/u in 2-3 weeks   Contact information:   [address] [place] [address] [phone]       Please follow up.   Why:  Caridology will call to set you up with 30 day event monitor       The results of significant diagnostics from this hospitalization (including imaging, microbiology, ancillary and laboratory) are listed below for reference.    Significant Diagnostic Studies: Dg Chest 2 View  09/07/2014   CLINICAL DATA:  Encephalopathy.  Patient fell out today at home.  EXAM: CHEST  2 VIEW  COMPARISON:  09/17/2013  FINDINGS: Cardiac enlargement with increasing pulmonary vascularity suggesting congestion. Atelectasis or infiltration in the lung bases. Hazy perihilar opacity may represent early edema. No blunting of costophrenic angles. No pneumothorax. Calcification of the aorta. Postoperative changes in the right shoulder. Degenerative changes  in the spine and left shoulder.  IMPRESSION: Cardiac enlargement with developing pulmonary vascular congestion and mild edema. Shallow inspiration with atelectasis in the lung bases.   Electronically Signed   By: Burman Nieves M.D.   On: 09/07/2014 02:19   Ct Head Wo Contrast  09/06/2014   CLINICAL DATA:  Hypotension and loss of consciousness. 3 syncopal episodes.  EXAM: CT HEAD WITHOUT CONTRAST  TECHNIQUE: Contiguous axial images were obtained from the base of the skull through the vertex without intravenous contrast.  COMPARISON:  None.  FINDINGS: Diffuse cerebral atrophy. Mild ventricular dilatation consistent with central atrophy. Patchy low-attenuation changes in the deep white matter consistent small vessel ischemic changes. Multiple old lacunar infarcts in the deep white matter particularly on the left. No mass effect or midline shift. No abnormal extra-axial fluid collections. Gray-white matter junctions are distinct. Basal cisterns are not effaced. No evidence of acute intracranial hemorrhage. No depressed skull fractures. Visualized paranasal sinuses and mastoid air cells are not opacified. Vascular calcifications.  IMPRESSION: No acute intracranial abnormalities. Chronic atrophy and small vessel ischemic changes.   Electronically Signed   By: Burman Nieves M.D.   On: 09/06/2014 23:32   Mr Brain Wo Contrast  09/07/2014   CLINICAL DATA:  Initial evaluation for acute encephalopathy, syncope.  EXAM: MRI HEAD WITHOUT CONTRAST  TECHNIQUE: Multiplanar, multiecho pulse sequences of the brain and surrounding structures were obtained without intravenous contrast.  COMPARISON:  Prior CT from 09/06/2014  FINDINGS: Diffuse prominence of the CSF containing spaces is compatible with generalized cerebral atrophy. Patchy and confluent T2/FLAIR hyperintensity within the periventricular and deep white matter both cerebral hemispheres most consistent with chronic small vessel ischemic disease, fairly mild for  patient age. Small vessel type changes present within the pons as well. There are remote lacunar infarcts within the left basal ganglia.  No abnormal foci of restricted diffusion to suggest acute intracranial infarct. Gray-white matter differentiation maintained. Normal intravascular flow voids preserved. No acute or chronic intracranial hemorrhage.  No mass lesion or midline shift. No mass effect. No hydrocephalus. No extra-axial fluid collection.  Craniocervical junction within normal limits. Degenerative changes noted within the visualized upper cervical spine. Pituitary gland normal. No acute abnormality about the orbits.  Scattered mucoperiosteal thickening present within the maxillary sinuses and ethmoidal air cells. Minimal scattered fluid signal intensity noted within the left mastoid air cells. Inner ear structures normal.  Bone marrow signal intensity within normal limits. Scalp soft tissues unremarkable.  IMPRESSION: 1. No acute intracranial infarct or other abnormality identified. 2. Generalized age-related cerebral atrophy with  mild chronic small vessel ischemic disease. 3. Small remote lacunar infarcts involving the left basal ganglia.   Electronically Signed   By: Rise Mu M.D.   On: 09/07/2014 04:17   Nm Myocar Multi W/spect W/wall Motion / Ef  09/08/2014   CLINICAL DATA:  81 year old, evaluation of syncope  EXAM: MYOCARDIAL IMAGING WITH SPECT (REST AND PHARMACOLOGIC-STRESS)  GATED LEFT VENTRICULAR WALL MOTION STUDY  LEFT VENTRICULAR EJECTION FRACTION  TECHNIQUE: Standard myocardial SPECT imaging was performed after resting intravenous injection of 10 mCi Tc-39m sestamibi. Subsequently, intravenous infusion of Lexiscan was performed under the supervision of the Cardiology staff. At peak effect of the drug, 30 mCi Tc-74m sestamibi was injected intravenously and standard myocardial SPECT imaging was performed. Quantitative gated imaging was also performed to evaluate left ventricular wall  motion, and estimate left ventricular ejection fraction.  COMPARISON:  None.  FINDINGS: Perfusion: There is mild diaphragmatic attenuation noted. There is mild decreased uptake along the inferior wall distribution seen at both rest and stress consistent with diaphragmatic attenuation. No ischemia noted.  Wall Motion: Normal left ventricular wall motion. No left ventricular dilation.  Left Ventricular Ejection Fraction: 53 %  End diastolic volume 83 ml  End systolic volume 39 ml  IMPRESSION: 1. No reversible ischemia or infarction.  2. Normal left ventricular wall motion.  3. Left ventricular ejection fraction 53%  4. Low-risk stress test findings*.  *2012 Appropriate Use Criteria for Coronary Revascularization Focused Update: J Am Coll Cardiol. 2012;59(9):857-881. http://content.dementiazones.com.aspx?articleid=1201161   Electronically Signed   By: Donato Schultz   On: 09/08/2014 15:56    Microbiology: Recent Results (from the past 240 hour(s))  Culture, Urine     Status: None   Collection Time: 09/06/14  9:14 PM  Result Value Ref Range Status   Specimen Description URINE, RANDOM  Final   Special Requests NONE  Final   Colony Count   Final    >=100,000 COLONIES/ML Performed at [facility]    Culture   Final    Multiple bacterial morphotypes present, none predominant. Suggest appropriate recollection if clinically indicated. Performed at [facility]    Report Status 09/08/2014 FINAL  Final     Labs: Basic Metabolic Panel:  Recent Labs Lab 09/06/14 2041 09/07/14 0129 09/07/14 0513 09/07/14 0515 09/08/14 0341  NA 138  --  137  --  140  K 3.3*  --  4.3  --  3.8  CL 106  --  105  --  105  CO2 25  --  28  --  26  GLUCOSE 120*  --  122*  --  112*  BUN 14  --  14  --  10  CREATININE 1.34 1.33 1.26  --  1.04  CALCIUM 8.7  --  8.7  --  9.0  MG  --   --   --  1.9  --    Liver Function Tests:  Recent Labs Lab 09/06/14 2041 09/07/14 0513 09/08/14 0341  AST ALT ALKPHOS 69 72 76  BILITOT 0.7 1.1 0.9  PROT 6.2 5.7* 6.6  ALBUMIN 3.5 3.2* 3.5   No results for input(s): LIPASE, AMYLASE in the last 168 hours.  Recent Labs Lab 09/06/14 2041  AMMONIA 29   CBC:  Recent Labs Lab 09/06/14 2041 09/07/14 0129 09/07/14 0513 09/08/14 0341  WBC 7.7 5.8 5.2 5.7  NEUTROABS 5.7  --  3.4 3.8  HGB 14.3 13.4 13.7 14.7  HCT  43.0 40.1 41.3 43.3  MCV 89.0 88.1 88.4 88.0  PLT 143* 138* 158 156   Cardiac Enzymes:  Recent Labs Lab 09/07/14 0922 09/07/14 1645 09/07/14 2033  TROPONINI <0.03 <0.03 <0.03   BNP: BNP (last 3 results)  Recent Labs  09/07/14 0129  BNP 17.1    ProBNP (last 3 results) No results for input(s): PROBNP in the last 8760 hours.  CBG:  Recent Labs Lab 09/07/14 1228 09/07/14 1732 09/07/14 2027 09/07/14 2330 09/08/14 0354  GLUCAP 118* 126* 141* 106* 117*       Signed:  Aarthi Uyeno  MD [facility] 09/08/2014, 4:42 PM

## 2014-09-10 DIAGNOSIS — H3530 Unspecified macular degeneration: Secondary | ICD-10-CM | POA: Diagnosis not present

## 2014-09-10 DIAGNOSIS — E785 Hyperlipidemia, unspecified: Secondary | ICD-10-CM | POA: Diagnosis not present

## 2014-09-10 DIAGNOSIS — E119 Type 2 diabetes mellitus without complications: Secondary | ICD-10-CM | POA: Diagnosis not present

## 2014-09-10 DIAGNOSIS — M109 Gout, unspecified: Secondary | ICD-10-CM | POA: Diagnosis not present

## 2014-09-10 DIAGNOSIS — I959 Hypotension, unspecified: Secondary | ICD-10-CM | POA: Diagnosis not present

## 2014-09-10 DIAGNOSIS — I10 Essential (primary) hypertension: Secondary | ICD-10-CM | POA: Diagnosis not present

## 2014-09-10 DIAGNOSIS — H548 Legal blindness, as defined in USA: Secondary | ICD-10-CM | POA: Diagnosis not present

## 2014-09-10 DIAGNOSIS — R55 Syncope and collapse: Secondary | ICD-10-CM | POA: Diagnosis not present

## 2014-09-10 DIAGNOSIS — G9340 Encephalopathy, unspecified: Secondary | ICD-10-CM | POA: Diagnosis not present

## 2014-09-14 DIAGNOSIS — I10 Essential (primary) hypertension: Secondary | ICD-10-CM | POA: Diagnosis not present

## 2014-09-14 DIAGNOSIS — G9340 Encephalopathy, unspecified: Secondary | ICD-10-CM | POA: Diagnosis not present

## 2014-09-14 DIAGNOSIS — R55 Syncope and collapse: Secondary | ICD-10-CM | POA: Diagnosis not present

## 2014-09-14 DIAGNOSIS — I959 Hypotension, unspecified: Secondary | ICD-10-CM | POA: Diagnosis not present

## 2014-09-14 DIAGNOSIS — H548 Legal blindness, as defined in USA: Secondary | ICD-10-CM | POA: Diagnosis not present

## 2014-09-14 DIAGNOSIS — H3530 Unspecified macular degeneration: Secondary | ICD-10-CM | POA: Diagnosis not present

## 2014-09-14 DIAGNOSIS — M109 Gout, unspecified: Secondary | ICD-10-CM | POA: Diagnosis not present

## 2014-09-14 DIAGNOSIS — E119 Type 2 diabetes mellitus without complications: Secondary | ICD-10-CM | POA: Diagnosis not present

## 2014-09-14 DIAGNOSIS — E785 Hyperlipidemia, unspecified: Secondary | ICD-10-CM | POA: Diagnosis not present

## 2014-09-15 DIAGNOSIS — I959 Hypotension, unspecified: Secondary | ICD-10-CM | POA: Diagnosis not present

## 2014-09-15 DIAGNOSIS — E785 Hyperlipidemia, unspecified: Secondary | ICD-10-CM | POA: Diagnosis not present

## 2014-09-15 DIAGNOSIS — G9340 Encephalopathy, unspecified: Secondary | ICD-10-CM | POA: Diagnosis not present

## 2014-09-15 DIAGNOSIS — H548 Legal blindness, as defined in USA: Secondary | ICD-10-CM | POA: Diagnosis not present

## 2014-09-15 DIAGNOSIS — H3530 Unspecified macular degeneration: Secondary | ICD-10-CM | POA: Diagnosis not present

## 2014-09-15 DIAGNOSIS — R55 Syncope and collapse: Secondary | ICD-10-CM | POA: Diagnosis not present

## 2014-09-15 DIAGNOSIS — E119 Type 2 diabetes mellitus without complications: Secondary | ICD-10-CM | POA: Diagnosis not present

## 2014-09-15 DIAGNOSIS — M109 Gout, unspecified: Secondary | ICD-10-CM | POA: Diagnosis not present

## 2014-09-15 DIAGNOSIS — I10 Essential (primary) hypertension: Secondary | ICD-10-CM | POA: Diagnosis not present

## 2014-09-16 DIAGNOSIS — E1151 Type 2 diabetes mellitus with diabetic peripheral angiopathy without gangrene: Secondary | ICD-10-CM | POA: Diagnosis not present

## 2014-09-16 DIAGNOSIS — L603 Nail dystrophy: Secondary | ICD-10-CM | POA: Diagnosis not present

## 2014-09-16 DIAGNOSIS — I739 Peripheral vascular disease, unspecified: Secondary | ICD-10-CM | POA: Diagnosis not present

## 2014-09-17 DIAGNOSIS — I10 Essential (primary) hypertension: Secondary | ICD-10-CM | POA: Diagnosis not present

## 2014-09-17 DIAGNOSIS — G9340 Encephalopathy, unspecified: Secondary | ICD-10-CM | POA: Diagnosis not present

## 2014-09-17 DIAGNOSIS — E785 Hyperlipidemia, unspecified: Secondary | ICD-10-CM | POA: Diagnosis not present

## 2014-09-17 DIAGNOSIS — H548 Legal blindness, as defined in USA: Secondary | ICD-10-CM | POA: Diagnosis not present

## 2014-09-17 DIAGNOSIS — E119 Type 2 diabetes mellitus without complications: Secondary | ICD-10-CM | POA: Diagnosis not present

## 2014-09-17 DIAGNOSIS — I959 Hypotension, unspecified: Secondary | ICD-10-CM | POA: Diagnosis not present

## 2014-09-17 DIAGNOSIS — R55 Syncope and collapse: Secondary | ICD-10-CM | POA: Diagnosis not present

## 2014-09-17 DIAGNOSIS — H3530 Unspecified macular degeneration: Secondary | ICD-10-CM | POA: Diagnosis not present

## 2014-09-17 DIAGNOSIS — M109 Gout, unspecified: Secondary | ICD-10-CM | POA: Diagnosis not present

## 2014-09-20 DIAGNOSIS — G9340 Encephalopathy, unspecified: Secondary | ICD-10-CM | POA: Diagnosis not present

## 2014-09-20 DIAGNOSIS — H548 Legal blindness, as defined in USA: Secondary | ICD-10-CM | POA: Diagnosis not present

## 2014-09-20 DIAGNOSIS — R55 Syncope and collapse: Secondary | ICD-10-CM | POA: Diagnosis not present

## 2014-09-20 DIAGNOSIS — I10 Essential (primary) hypertension: Secondary | ICD-10-CM | POA: Diagnosis not present

## 2014-09-20 DIAGNOSIS — M109 Gout, unspecified: Secondary | ICD-10-CM | POA: Diagnosis not present

## 2014-09-20 DIAGNOSIS — I959 Hypotension, unspecified: Secondary | ICD-10-CM | POA: Diagnosis not present

## 2014-09-20 DIAGNOSIS — H3530 Unspecified macular degeneration: Secondary | ICD-10-CM | POA: Diagnosis not present

## 2014-09-20 DIAGNOSIS — E119 Type 2 diabetes mellitus without complications: Secondary | ICD-10-CM | POA: Diagnosis not present

## 2014-09-20 DIAGNOSIS — E785 Hyperlipidemia, unspecified: Secondary | ICD-10-CM | POA: Diagnosis not present

## 2014-09-21 DIAGNOSIS — R55 Syncope and collapse: Secondary | ICD-10-CM | POA: Diagnosis not present

## 2014-09-21 DIAGNOSIS — G9340 Encephalopathy, unspecified: Secondary | ICD-10-CM | POA: Diagnosis not present

## 2014-09-21 DIAGNOSIS — M109 Gout, unspecified: Secondary | ICD-10-CM | POA: Diagnosis not present

## 2014-09-21 DIAGNOSIS — H548 Legal blindness, as defined in USA: Secondary | ICD-10-CM | POA: Diagnosis not present

## 2014-09-21 DIAGNOSIS — I10 Essential (primary) hypertension: Secondary | ICD-10-CM | POA: Diagnosis not present

## 2014-09-21 DIAGNOSIS — I959 Hypotension, unspecified: Secondary | ICD-10-CM | POA: Diagnosis not present

## 2014-09-21 DIAGNOSIS — E119 Type 2 diabetes mellitus without complications: Secondary | ICD-10-CM | POA: Diagnosis not present

## 2014-09-21 DIAGNOSIS — E785 Hyperlipidemia, unspecified: Secondary | ICD-10-CM | POA: Diagnosis not present

## 2014-09-21 DIAGNOSIS — H3530 Unspecified macular degeneration: Secondary | ICD-10-CM | POA: Diagnosis not present

## 2014-09-22 ENCOUNTER — Telehealth: Payer: Self-pay | Admitting: *Deleted

## 2014-09-22 DIAGNOSIS — I959 Hypotension, unspecified: Secondary | ICD-10-CM | POA: Diagnosis not present

## 2014-09-22 DIAGNOSIS — H548 Legal blindness, as defined in USA: Secondary | ICD-10-CM | POA: Diagnosis not present

## 2014-09-22 DIAGNOSIS — R55 Syncope and collapse: Secondary | ICD-10-CM | POA: Diagnosis not present

## 2014-09-22 DIAGNOSIS — I10 Essential (primary) hypertension: Secondary | ICD-10-CM | POA: Diagnosis not present

## 2014-09-22 DIAGNOSIS — M109 Gout, unspecified: Secondary | ICD-10-CM | POA: Diagnosis not present

## 2014-09-22 DIAGNOSIS — E119 Type 2 diabetes mellitus without complications: Secondary | ICD-10-CM | POA: Diagnosis not present

## 2014-09-22 DIAGNOSIS — E785 Hyperlipidemia, unspecified: Secondary | ICD-10-CM | POA: Diagnosis not present

## 2014-09-22 DIAGNOSIS — H3530 Unspecified macular degeneration: Secondary | ICD-10-CM | POA: Diagnosis not present

## 2014-09-22 DIAGNOSIS — G9340 Encephalopathy, unspecified: Secondary | ICD-10-CM | POA: Diagnosis not present

## 2014-09-22 DIAGNOSIS — G894 Chronic pain syndrome: Secondary | ICD-10-CM | POA: Diagnosis not present

## 2014-09-22 NOTE — Telephone Encounter (Signed)
Per Dr. Salena Saner order a 30 day Event Monitor - scheduled for Thursday 09/23/14.  LM on patients' cell #.  No answer on home #.

## 2014-09-23 DIAGNOSIS — G9340 Encephalopathy, unspecified: Secondary | ICD-10-CM | POA: Diagnosis not present

## 2014-09-23 DIAGNOSIS — E785 Hyperlipidemia, unspecified: Secondary | ICD-10-CM | POA: Diagnosis not present

## 2014-09-23 DIAGNOSIS — I959 Hypotension, unspecified: Secondary | ICD-10-CM | POA: Diagnosis not present

## 2014-09-23 DIAGNOSIS — H3530 Unspecified macular degeneration: Secondary | ICD-10-CM | POA: Diagnosis not present

## 2014-09-23 DIAGNOSIS — H548 Legal blindness, as defined in USA: Secondary | ICD-10-CM | POA: Diagnosis not present

## 2014-09-23 DIAGNOSIS — M109 Gout, unspecified: Secondary | ICD-10-CM | POA: Diagnosis not present

## 2014-09-23 DIAGNOSIS — E119 Type 2 diabetes mellitus without complications: Secondary | ICD-10-CM | POA: Diagnosis not present

## 2014-09-23 DIAGNOSIS — R55 Syncope and collapse: Secondary | ICD-10-CM | POA: Diagnosis not present

## 2014-09-23 DIAGNOSIS — I10 Essential (primary) hypertension: Secondary | ICD-10-CM | POA: Diagnosis not present

## 2014-09-24 ENCOUNTER — Telehealth: Payer: Self-pay | Admitting: Cardiovascular Disease

## 2014-09-24 DIAGNOSIS — G9340 Encephalopathy, unspecified: Secondary | ICD-10-CM | POA: Diagnosis not present

## 2014-09-24 DIAGNOSIS — M109 Gout, unspecified: Secondary | ICD-10-CM | POA: Diagnosis not present

## 2014-09-24 DIAGNOSIS — E119 Type 2 diabetes mellitus without complications: Secondary | ICD-10-CM | POA: Diagnosis not present

## 2014-09-24 DIAGNOSIS — H3530 Unspecified macular degeneration: Secondary | ICD-10-CM | POA: Diagnosis not present

## 2014-09-24 DIAGNOSIS — E785 Hyperlipidemia, unspecified: Secondary | ICD-10-CM | POA: Diagnosis not present

## 2014-09-24 DIAGNOSIS — I10 Essential (primary) hypertension: Secondary | ICD-10-CM | POA: Diagnosis not present

## 2014-09-24 DIAGNOSIS — H548 Legal blindness, as defined in USA: Secondary | ICD-10-CM | POA: Diagnosis not present

## 2014-09-24 DIAGNOSIS — R55 Syncope and collapse: Secondary | ICD-10-CM | POA: Diagnosis not present

## 2014-09-24 DIAGNOSIS — I959 Hypotension, unspecified: Secondary | ICD-10-CM | POA: Diagnosis not present

## 2014-09-24 NOTE — Telephone Encounter (Signed)
Please call.

## 2014-09-24 NOTE — Telephone Encounter (Signed)
Spoke to [employer] from Dr. Ephriam Knuckles office - following up on patient's monitor. She gave number to call patient.  I called patient, verified he could make appt to be seen by nurse Monday AM for event monitor wearing. Wife confirmed she will bring at scheduled time.

## 2014-09-27 ENCOUNTER — Ambulatory Visit: Payer: Commercial Managed Care - HMO | Admitting: *Deleted

## 2014-09-27 DIAGNOSIS — R55 Syncope and collapse: Secondary | ICD-10-CM | POA: Diagnosis not present

## 2014-09-27 NOTE — Patient Instructions (Signed)
Cardiac Event Monitoring A cardiac event monitor is a small recording device used to help detect abnormal heart rhythms (arrhythmias). The monitor is used to record heart rhythm when noticeable symptoms such as the following occur:  Fast heartbeats (palpitations), such as heart racing or fluttering.  Dizziness.  Fainting or light-headedness.  Unexplained weakness. The monitor is wired to two electrodes placed on your chest. Electrodes are flat, sticky disks that attach to your skin. The monitor can be worn for up to 30 days. You will wear the monitor at all times, except when bathing.  HOW TO USE YOUR CARDIAC EVENT MONITOR A technician will prepare your chest for the electrode placement. The technician will show you how to place the electrodes, how to work the monitor, and how to replace the batteries. Take time to practice using the monitor before you leave the office. Make sure you understand how to send the information from the monitor to your health care provider. This requires a telephone with a landline, not a cell phone. You need to:  Wear your monitor at all times, except when you are in water:  Do not get the monitor wet.  Take the monitor off when bathing. Do not swim or use a hot tub with it on.  Keep your skin clean. Do not put body lotion or moisturizer on your chest.  Change the electrodes daily or any time they stop sticking to your skin. You might need to use tape to keep them on.  It is possible that your skin under the electrodes could become irritated. To keep this from happening, try to put the electrodes in slightly different places on your chest. However, they must remain in the area under your left breast and in the upper right section of your chest.  Make sure the monitor is safely clipped to your clothing or in a location close to your body that your health care provider recommends.  Press the button to record when you feel symptoms of heart trouble, such as  dizziness, weakness, light-headedness, palpitations, thumping, shortness of breath, unexplained weakness, or a fluttering or racing heart. The monitor is always on and records what happened slightly before you pressed the button, so do not worry about being too late to get good information.  Keep a diary of your activities, such as walking, doing chores, and taking medicine. It is especially important to note what you were doing when you pushed the button to record your symptoms. This will help your health care provider determine what might be contributing to your symptoms. The information stored in your monitor will be reviewed by your health care provider alongside your diary entries.  Send the recorded information as recommended by your health care provider. It is important to understand that it will take some time for your health care provider to process the results.  Change the batteries as recommended by your health care provider. SEEK IMMEDIATE MEDICAL CARE IF:   You have chest pain.  You have extreme difficulty breathing or shortness of breath.  You develop a very fast heartbeat that persists.  You develop dizziness that does not go away.  You faint or constantly feel you are about to faint. Document Released: 02/28/2008 Document Revised: 10/05/2013 Document Reviewed: 11/17/2012 Exit[employer] Patient Information 2015 [employer]. This information is not intended to replace advice given to you by your health care provider. Make sure you discuss any questions you have with your health care provider.  

## 2014-09-27 NOTE — Progress Notes (Signed)
30 day monitor placement per Dr. Barbaraann Barthel.

## 2014-09-28 DIAGNOSIS — H548 Legal blindness, as defined in USA: Secondary | ICD-10-CM | POA: Diagnosis not present

## 2014-09-28 DIAGNOSIS — H3530 Unspecified macular degeneration: Secondary | ICD-10-CM | POA: Diagnosis not present

## 2014-09-28 DIAGNOSIS — E785 Hyperlipidemia, unspecified: Secondary | ICD-10-CM | POA: Diagnosis not present

## 2014-09-28 DIAGNOSIS — E119 Type 2 diabetes mellitus without complications: Secondary | ICD-10-CM | POA: Diagnosis not present

## 2014-09-28 DIAGNOSIS — M109 Gout, unspecified: Secondary | ICD-10-CM | POA: Diagnosis not present

## 2014-09-28 DIAGNOSIS — I959 Hypotension, unspecified: Secondary | ICD-10-CM | POA: Diagnosis not present

## 2014-09-28 DIAGNOSIS — R55 Syncope and collapse: Secondary | ICD-10-CM | POA: Diagnosis not present

## 2014-09-28 DIAGNOSIS — I10 Essential (primary) hypertension: Secondary | ICD-10-CM | POA: Diagnosis not present

## 2014-09-28 DIAGNOSIS — G9340 Encephalopathy, unspecified: Secondary | ICD-10-CM | POA: Diagnosis not present

## 2014-10-05 DIAGNOSIS — G541 Lumbosacral plexus disorders: Secondary | ICD-10-CM | POA: Diagnosis not present

## 2014-10-05 DIAGNOSIS — G8929 Other chronic pain: Secondary | ICD-10-CM | POA: Diagnosis not present

## 2014-10-05 DIAGNOSIS — M545 Low back pain: Secondary | ICD-10-CM | POA: Diagnosis not present

## 2014-10-05 DIAGNOSIS — M25562 Pain in left knee: Secondary | ICD-10-CM | POA: Diagnosis not present

## 2014-10-05 DIAGNOSIS — G603 Idiopathic progressive neuropathy: Secondary | ICD-10-CM | POA: Diagnosis not present

## 2014-10-05 DIAGNOSIS — M25561 Pain in right knee: Secondary | ICD-10-CM | POA: Diagnosis not present

## 2014-10-05 DIAGNOSIS — M25511 Pain in right shoulder: Secondary | ICD-10-CM | POA: Diagnosis not present

## 2014-10-06 DIAGNOSIS — R55 Syncope and collapse: Secondary | ICD-10-CM | POA: Diagnosis not present

## 2014-10-06 DIAGNOSIS — H3530 Unspecified macular degeneration: Secondary | ICD-10-CM | POA: Diagnosis not present

## 2014-10-06 DIAGNOSIS — G9340 Encephalopathy, unspecified: Secondary | ICD-10-CM | POA: Diagnosis not present

## 2014-10-06 DIAGNOSIS — E785 Hyperlipidemia, unspecified: Secondary | ICD-10-CM | POA: Diagnosis not present

## 2014-10-06 DIAGNOSIS — I10 Essential (primary) hypertension: Secondary | ICD-10-CM | POA: Diagnosis not present

## 2014-10-06 DIAGNOSIS — H548 Legal blindness, as defined in USA: Secondary | ICD-10-CM | POA: Diagnosis not present

## 2014-10-06 DIAGNOSIS — E119 Type 2 diabetes mellitus without complications: Secondary | ICD-10-CM | POA: Diagnosis not present

## 2014-10-06 DIAGNOSIS — I959 Hypotension, unspecified: Secondary | ICD-10-CM | POA: Diagnosis not present

## 2014-10-06 DIAGNOSIS — M109 Gout, unspecified: Secondary | ICD-10-CM | POA: Diagnosis not present

## 2014-10-07 DIAGNOSIS — G9340 Encephalopathy, unspecified: Secondary | ICD-10-CM | POA: Diagnosis not present

## 2014-10-07 DIAGNOSIS — R55 Syncope and collapse: Secondary | ICD-10-CM | POA: Diagnosis not present

## 2014-10-07 DIAGNOSIS — I10 Essential (primary) hypertension: Secondary | ICD-10-CM | POA: Diagnosis not present

## 2014-10-07 DIAGNOSIS — I959 Hypotension, unspecified: Secondary | ICD-10-CM | POA: Diagnosis not present

## 2014-10-07 DIAGNOSIS — H548 Legal blindness, as defined in USA: Secondary | ICD-10-CM | POA: Diagnosis not present

## 2014-10-07 DIAGNOSIS — M109 Gout, unspecified: Secondary | ICD-10-CM | POA: Diagnosis not present

## 2014-10-07 DIAGNOSIS — E119 Type 2 diabetes mellitus without complications: Secondary | ICD-10-CM | POA: Diagnosis not present

## 2014-10-07 DIAGNOSIS — H3530 Unspecified macular degeneration: Secondary | ICD-10-CM | POA: Diagnosis not present

## 2014-10-07 DIAGNOSIS — E785 Hyperlipidemia, unspecified: Secondary | ICD-10-CM | POA: Diagnosis not present

## 2014-10-12 DIAGNOSIS — R55 Syncope and collapse: Secondary | ICD-10-CM | POA: Diagnosis not present

## 2014-10-12 DIAGNOSIS — E785 Hyperlipidemia, unspecified: Secondary | ICD-10-CM | POA: Diagnosis not present

## 2014-10-12 DIAGNOSIS — M109 Gout, unspecified: Secondary | ICD-10-CM | POA: Diagnosis not present

## 2014-10-12 DIAGNOSIS — H3530 Unspecified macular degeneration: Secondary | ICD-10-CM | POA: Diagnosis not present

## 2014-10-12 DIAGNOSIS — I959 Hypotension, unspecified: Secondary | ICD-10-CM | POA: Diagnosis not present

## 2014-10-12 DIAGNOSIS — H548 Legal blindness, as defined in USA: Secondary | ICD-10-CM | POA: Diagnosis not present

## 2014-10-12 DIAGNOSIS — G9340 Encephalopathy, unspecified: Secondary | ICD-10-CM | POA: Diagnosis not present

## 2014-10-12 DIAGNOSIS — I10 Essential (primary) hypertension: Secondary | ICD-10-CM | POA: Diagnosis not present

## 2014-10-12 DIAGNOSIS — E119 Type 2 diabetes mellitus without complications: Secondary | ICD-10-CM | POA: Diagnosis not present

## 2014-10-13 ENCOUNTER — Encounter: Payer: Self-pay | Admitting: Cardiology

## 2014-10-13 ENCOUNTER — Ambulatory Visit (INDEPENDENT_AMBULATORY_CARE_PROVIDER_SITE_OTHER): Payer: Commercial Managed Care - HMO | Admitting: Cardiology

## 2014-10-13 VITALS — Ht 70.0 in | Wt 238.5 lb

## 2014-10-13 DIAGNOSIS — Z9189 Other specified personal risk factors, not elsewhere classified: Secondary | ICD-10-CM | POA: Diagnosis not present

## 2014-10-13 DIAGNOSIS — Z87898 Personal history of other specified conditions: Secondary | ICD-10-CM

## 2014-10-13 NOTE — Progress Notes (Signed)
10/13/2014 [name]   [birth year]  [id number]  Primary Physician: [name], MD Primary Cardiologist: Dr . Royann Shivers  Reason for Visit/CC: Post hospital follow-up for syncope.  HPI:  The patient is a 81 year old male who presents to clinic today for post hospital follow-up. His past history significant for type 2 diabetes, morbid obesity, and OSA. He was admitted to [facility] for syncope. On arrival to the ED patient was noted to be hypotensive/orthostatic. CT and MRI of the head  were both negative for any acute abnormalities. EKG showed PACs and right anterior fascicular bundle branch block. Cardiac enzymes were negative 3. 2-D echo showed an EF of 55-60% with no wall motion abnormalities. Patient's Cozaar was discontinued and patient was given some IV fluids. Orthostaic hypotension resolved. Patient underwent a Myoview stress test which showed no reversible ischemia or infraction. Normal left ventricular wall motion. EF of 53%. Low risk stress test findings. Patient did not have any further episodes during the hospitalization. He was discharged home with a 30 day event monitor to r/o arrhthymias.   Today in clinic he reports that he has done well. He denies any further syncope/ near syncope. No dizziness, palpitations, dyspnea or chest pain. He has been compliant with heart monitor. He has been wearing it for 2 weeks. On 10/01/13 he had a 19 beat run of NSVT. This was an automatic trigger. No other arrhthymias.   BP is stable today and well controlled. Orthostatic vitals were checked and he is not orthostatic.    Current Outpatient Prescriptions  Medication Sig Dispense Refill  . allopurinol (ZYLOPRIM) 300 MG tablet Take 300 mg by mouth daily.    Marland Kitchen amLODipine (NORVASC) 5 MG tablet Take 1 tablet (5 mg total) by mouth daily. 30 tablet 0  . Brinzolamide-Brimonidine (SIMBRINZA) 1-0.2 % SUSP Place 1 drop into the left eye 3 (three) times daily.     . dorzolamide  (TRUSOPT) 2 % ophthalmic solution Place 1 drop into the right eye 2 (two) times daily.     . fluorometholone (FML) 0.1 % ophthalmic suspension Place 1 drop into both eyes 2 (two) times daily.   6  . gabapentin (NEURONTIN) 300 MG capsule Take 1 capsule (300 mg total) by mouth 3 (three) times daily. (Patient taking differently: Take 300 mg by mouth 2 (two) times daily. ) 90 capsule 2  . ketoconazole (NIZORAL) 2 % cream Apply 1 application topically 2 (two) times daily as needed for irritation.     . metFORMIN (GLUCOPHAGE) 1000 MG tablet Take 1,000 mg by mouth 2 (two) times daily with a meal.    . oxyCODONE (OXY IR/ROXICODONE) 5 MG immediate release tablet Take 5 mg by mouth every 4 (four) hours as needed for severe pain.    Bertram Gala Glycol-Propyl Glycol (SYSTANE OP) Place 1 drop into the left eye 3 (three) times daily.    . pravastatin (PRAVACHOL) 40 MG tablet Take 40 mg by mouth daily.    . Tamsulosin HCl (FLOMAX) 0.4 MG CAPS Take 0.4 mg by mouth daily.    . TRAVATAN Z 0.004 % SOLN ophthalmic solution Place 1 drop into both eyes at bedtime.   6  . VOLTAREN 1 % GEL Apply 2 g topically daily as needed.     . colchicine 0.6 MG tablet Take 1 tablet by mouth as needed.  0   No current facility-administered medications for this visit.    No Known Allergies  History   Social History  .  Marital Status: Married    Spouse Name: N/A  . Number of Children: N/A  . Years of Education: N/A   Occupational History  . Not on file.   Social History Main Topics  . Smoking status: Former Smoker -- 2.00 packs/day    Types: Cigarettes  . Smokeless tobacco: Never Used     Comment: quit smoking 49yrs ago  . Alcohol Use: Yes     Comment: rarely  . Drug Use: No  . Sexual Activity: Not Currently   Other Topics Concern  . Not on file   Social History Narrative     Review of Systems: General: negative for chills, fever, night sweats or weight changes.  Cardiovascular: negative for chest pain,  dyspnea on exertion, edema, orthopnea, palpitations, paroxysmal nocturnal dyspnea or shortness of breath Dermatological: negative for rash Respiratory: negative for cough or wheezing Urologic: negative for hematuria Abdominal: negative for nausea, vomiting, diarrhea, bright red blood per rectum, melena, or hematemesis Neurologic: negative for visual changes, syncope, or dizziness All other systems reviewed and are otherwise negative except as noted above.    Height 5\' 10"  (1.778 m), weight 238 lb 8 oz (108.183 kg).  General appearance: alert, cooperative and no distress Neck: no carotid bruit and no JVD Lungs: clear to auscultation bilaterally Heart: regular rate and rhythm, S1, S2 normal, no murmur, click, rub or gallop Extremities: no LEE Pulses: 2+ and symmetric Skin: warm and dry Neurologic: Grossly normal  EKG not performed (has heart monitor)  ASSESSMENT AND PLAN:   1. Syncope: No further syncope/ near syncope. Felt to be related to orthostatic hypotension which has resolved after discontinuation of Cozaar. He had a run of NSVT on heart monitor (only 1 episode). Continue to monitor for another 2 weeks.  2. Orthostatic Hypotension: resolved after discontinuation of Cozaar. Orthostatic vitals checked today and are normal. BP is stable. No hypertension.  3. NSVT: heart monitor captured 1 episode. 19 beat run. Max HR 125 bpm. Automatic trigger. Asymptomatic. No further syncope/ near syncope. Continue to wear for another 2 weeks to assess for further arrhthymias. Recent 2D echo showed Nl LVF and NST showed no ischemia.  PLAN  F/U with Dr. Royann Shivers in 2 months.  [name], BRITTAINYPA-C 10/13/2014 3:18 PM

## 2014-10-13 NOTE — Patient Instructions (Signed)
Your physician recommends that you schedule a follow-up appointment in: 8 Weeks with Dr Royann Shivers

## 2014-10-14 DIAGNOSIS — E119 Type 2 diabetes mellitus without complications: Secondary | ICD-10-CM | POA: Diagnosis not present

## 2014-10-14 DIAGNOSIS — R55 Syncope and collapse: Secondary | ICD-10-CM | POA: Diagnosis not present

## 2014-10-14 DIAGNOSIS — I959 Hypotension, unspecified: Secondary | ICD-10-CM | POA: Diagnosis not present

## 2014-10-14 DIAGNOSIS — E785 Hyperlipidemia, unspecified: Secondary | ICD-10-CM | POA: Diagnosis not present

## 2014-10-14 DIAGNOSIS — G9340 Encephalopathy, unspecified: Secondary | ICD-10-CM | POA: Diagnosis not present

## 2014-10-14 DIAGNOSIS — H3530 Unspecified macular degeneration: Secondary | ICD-10-CM | POA: Diagnosis not present

## 2014-10-14 DIAGNOSIS — H548 Legal blindness, as defined in USA: Secondary | ICD-10-CM | POA: Diagnosis not present

## 2014-10-14 DIAGNOSIS — M109 Gout, unspecified: Secondary | ICD-10-CM | POA: Diagnosis not present

## 2014-10-14 DIAGNOSIS — I10 Essential (primary) hypertension: Secondary | ICD-10-CM | POA: Diagnosis not present

## 2014-10-15 DIAGNOSIS — E119 Type 2 diabetes mellitus without complications: Secondary | ICD-10-CM | POA: Diagnosis not present

## 2014-10-15 DIAGNOSIS — G9340 Encephalopathy, unspecified: Secondary | ICD-10-CM | POA: Diagnosis not present

## 2014-10-15 DIAGNOSIS — E785 Hyperlipidemia, unspecified: Secondary | ICD-10-CM | POA: Diagnosis not present

## 2014-10-15 DIAGNOSIS — H3530 Unspecified macular degeneration: Secondary | ICD-10-CM | POA: Diagnosis not present

## 2014-10-15 DIAGNOSIS — I959 Hypotension, unspecified: Secondary | ICD-10-CM | POA: Diagnosis not present

## 2014-10-15 DIAGNOSIS — I10 Essential (primary) hypertension: Secondary | ICD-10-CM | POA: Diagnosis not present

## 2014-10-15 DIAGNOSIS — H548 Legal blindness, as defined in USA: Secondary | ICD-10-CM | POA: Diagnosis not present

## 2014-10-15 DIAGNOSIS — M109 Gout, unspecified: Secondary | ICD-10-CM | POA: Diagnosis not present

## 2014-10-15 DIAGNOSIS — R55 Syncope and collapse: Secondary | ICD-10-CM | POA: Diagnosis not present

## 2014-10-18 DIAGNOSIS — M25511 Pain in right shoulder: Secondary | ICD-10-CM | POA: Diagnosis not present

## 2014-10-18 DIAGNOSIS — M25561 Pain in right knee: Secondary | ICD-10-CM | POA: Diagnosis not present

## 2014-10-18 DIAGNOSIS — G8929 Other chronic pain: Secondary | ICD-10-CM | POA: Diagnosis not present

## 2014-10-18 DIAGNOSIS — M545 Low back pain: Secondary | ICD-10-CM | POA: Diagnosis not present

## 2014-10-18 DIAGNOSIS — M25562 Pain in left knee: Secondary | ICD-10-CM | POA: Diagnosis not present

## 2014-10-27 ENCOUNTER — Encounter (INDEPENDENT_AMBULATORY_CARE_PROVIDER_SITE_OTHER): Payer: Commercial Managed Care - HMO

## 2014-10-27 ENCOUNTER — Other Ambulatory Visit: Payer: Self-pay

## 2014-10-27 DIAGNOSIS — R55 Syncope and collapse: Secondary | ICD-10-CM

## 2014-11-03 DIAGNOSIS — I10 Essential (primary) hypertension: Secondary | ICD-10-CM | POA: Diagnosis not present

## 2014-11-03 DIAGNOSIS — H3530 Unspecified macular degeneration: Secondary | ICD-10-CM | POA: Diagnosis not present

## 2014-11-03 DIAGNOSIS — R55 Syncope and collapse: Secondary | ICD-10-CM | POA: Diagnosis not present

## 2014-11-03 DIAGNOSIS — M109 Gout, unspecified: Secondary | ICD-10-CM | POA: Diagnosis not present

## 2014-11-03 DIAGNOSIS — H548 Legal blindness, as defined in USA: Secondary | ICD-10-CM | POA: Diagnosis not present

## 2014-11-03 DIAGNOSIS — E119 Type 2 diabetes mellitus without complications: Secondary | ICD-10-CM | POA: Diagnosis not present

## 2014-11-03 DIAGNOSIS — G9340 Encephalopathy, unspecified: Secondary | ICD-10-CM | POA: Diagnosis not present

## 2014-11-03 DIAGNOSIS — I959 Hypotension, unspecified: Secondary | ICD-10-CM | POA: Diagnosis not present

## 2014-11-03 DIAGNOSIS — E785 Hyperlipidemia, unspecified: Secondary | ICD-10-CM | POA: Diagnosis not present

## 2014-11-16 DIAGNOSIS — M545 Low back pain: Secondary | ICD-10-CM | POA: Diagnosis not present

## 2014-11-16 DIAGNOSIS — M5412 Radiculopathy, cervical region: Secondary | ICD-10-CM | POA: Diagnosis not present

## 2014-11-16 DIAGNOSIS — R202 Paresthesia of skin: Secondary | ICD-10-CM | POA: Diagnosis not present

## 2014-11-16 DIAGNOSIS — G890 Central pain syndrome: Secondary | ICD-10-CM | POA: Diagnosis not present

## 2014-11-25 DIAGNOSIS — H47233 Glaucomatous optic atrophy, bilateral: Secondary | ICD-10-CM | POA: Diagnosis not present

## 2014-11-25 DIAGNOSIS — H16223 Keratoconjunctivitis sicca, not specified as Sjogren's, bilateral: Secondary | ICD-10-CM | POA: Diagnosis not present

## 2014-11-25 DIAGNOSIS — H34819 Central retinal vein occlusion, unspecified eye: Secondary | ICD-10-CM | POA: Diagnosis not present

## 2014-12-02 DIAGNOSIS — I739 Peripheral vascular disease, unspecified: Secondary | ICD-10-CM | POA: Diagnosis not present

## 2014-12-02 DIAGNOSIS — L989 Disorder of the skin and subcutaneous tissue, unspecified: Secondary | ICD-10-CM | POA: Diagnosis not present

## 2014-12-02 DIAGNOSIS — E1151 Type 2 diabetes mellitus with diabetic peripheral angiopathy without gangrene: Secondary | ICD-10-CM | POA: Diagnosis not present

## 2014-12-02 DIAGNOSIS — L603 Nail dystrophy: Secondary | ICD-10-CM | POA: Diagnosis not present

## 2014-12-02 DIAGNOSIS — G479 Sleep disorder, unspecified: Secondary | ICD-10-CM | POA: Diagnosis not present

## 2014-12-08 ENCOUNTER — Ambulatory Visit: Payer: Commercial Managed Care - HMO | Admitting: Cardiovascular Disease

## 2015-01-03 DIAGNOSIS — L439 Lichen planus, unspecified: Secondary | ICD-10-CM | POA: Diagnosis not present

## 2015-01-04 DIAGNOSIS — L439 Lichen planus, unspecified: Secondary | ICD-10-CM | POA: Diagnosis not present

## 2015-01-04 DIAGNOSIS — L438 Other lichen planus: Secondary | ICD-10-CM | POA: Diagnosis not present

## 2015-01-22 ENCOUNTER — Observation Stay (HOSPITAL_COMMUNITY)
Admission: EM | Admit: 2015-01-22 | Discharge: 2015-01-24 | Disposition: A | Discharge status: Home / Self Care | Payer: Commercial Managed Care - HMO | Attending: Internal Medicine | Admitting: Internal Medicine

## 2015-01-22 ENCOUNTER — Encounter (HOSPITAL_COMMUNITY): Payer: Self-pay

## 2015-01-22 ENCOUNTER — Observation Stay (HOSPITAL_BASED_OUTPATIENT_CLINIC_OR_DEPARTMENT_OTHER): Payer: Commercial Managed Care - HMO

## 2015-01-22 ENCOUNTER — Emergency Department (HOSPITAL_COMMUNITY): Payer: Commercial Managed Care - HMO

## 2015-01-22 DIAGNOSIS — I9589 Other hypotension: Secondary | ICD-10-CM

## 2015-01-22 DIAGNOSIS — M549 Dorsalgia, unspecified: Secondary | ICD-10-CM | POA: Insufficient documentation

## 2015-01-22 DIAGNOSIS — I10 Essential (primary) hypertension: Secondary | ICD-10-CM | POA: Diagnosis not present

## 2015-01-22 DIAGNOSIS — E872 Acidosis, unspecified: Secondary | ICD-10-CM | POA: Diagnosis present

## 2015-01-22 DIAGNOSIS — E785 Hyperlipidemia, unspecified: Secondary | ICD-10-CM | POA: Diagnosis not present

## 2015-01-22 DIAGNOSIS — E860 Dehydration: Secondary | ICD-10-CM | POA: Diagnosis not present

## 2015-01-22 DIAGNOSIS — G8929 Other chronic pain: Secondary | ICD-10-CM | POA: Diagnosis not present

## 2015-01-22 DIAGNOSIS — Z6832 Body mass index (BMI) 32.0-32.9, adult: Secondary | ICD-10-CM | POA: Insufficient documentation

## 2015-01-22 DIAGNOSIS — R55 Syncope and collapse: Principal | ICD-10-CM | POA: Diagnosis present

## 2015-01-22 DIAGNOSIS — E669 Obesity, unspecified: Secondary | ICD-10-CM | POA: Diagnosis not present

## 2015-01-22 DIAGNOSIS — N179 Acute kidney failure, unspecified: Secondary | ICD-10-CM | POA: Diagnosis not present

## 2015-01-22 DIAGNOSIS — H548 Legal blindness, as defined in USA: Secondary | ICD-10-CM | POA: Insufficient documentation

## 2015-01-22 DIAGNOSIS — Z87891 Personal history of nicotine dependence: Secondary | ICD-10-CM | POA: Insufficient documentation

## 2015-01-22 DIAGNOSIS — H3530 Unspecified macular degeneration: Secondary | ICD-10-CM | POA: Diagnosis not present

## 2015-01-22 DIAGNOSIS — I959 Hypotension, unspecified: Secondary | ICD-10-CM | POA: Diagnosis present

## 2015-01-22 DIAGNOSIS — Z79899 Other long term (current) drug therapy: Secondary | ICD-10-CM | POA: Insufficient documentation

## 2015-01-22 DIAGNOSIS — E119 Type 2 diabetes mellitus without complications: Secondary | ICD-10-CM | POA: Insufficient documentation

## 2015-01-22 DIAGNOSIS — R4020 Unspecified coma: Secondary | ICD-10-CM | POA: Diagnosis not present

## 2015-01-22 DIAGNOSIS — M109 Gout, unspecified: Secondary | ICD-10-CM | POA: Insufficient documentation

## 2015-01-22 DIAGNOSIS — H409 Unspecified glaucoma: Secondary | ICD-10-CM | POA: Diagnosis not present

## 2015-01-22 DIAGNOSIS — I951 Orthostatic hypotension: Secondary | ICD-10-CM

## 2015-01-22 DIAGNOSIS — R40243 Glasgow coma scale score 3-8: Secondary | ICD-10-CM | POA: Diagnosis not present

## 2015-01-22 LAB — BASIC METABOLIC PANEL
Anion gap: 11 (ref 5–15)
Anion gap: 8 (ref 5–15)
BUN: 18 mg/dL (ref 6–20)
BUN: 20 mg/dL (ref 6–20)
CO2: 23 mmol/L (ref 22–32)
CO2: 26 mmol/L (ref 22–32)
Calcium: 9 mg/dL (ref 8.9–10.3)
Calcium: 8.5 mg/dL — ABNORMAL LOW (ref 8.9–10.3)
Chloride: 102 mmol/L (ref 101–111)
Chloride: 105 mmol/L (ref 101–111)
Creatinine, Ser: 1.79 mg/dL — ABNORMAL HIGH (ref 0.61–1.24)
Creatinine, Ser: 1.58 mg/dL — ABNORMAL HIGH (ref 0.61–1.24)
GFR calc Af Amer: 39 mL/min — ABNORMAL LOW (ref >60)
GFR calc Af Amer: 46 mL/min — ABNORMAL LOW (ref >60)
GFR calc non Af Amer: 34 mL/min — ABNORMAL LOW (ref >60)
GFR calc non Af Amer: 39 mL/min — ABNORMAL LOW (ref >60)
Glucose, Bld: 252 mg/dL — ABNORMAL HIGH (ref 65–99)
Glucose, Bld: 101 mg/dL — ABNORMAL HIGH (ref 65–99)
Potassium: 3.5 mmol/L (ref 3.5–5.1)
Potassium: 4 mmol/L (ref 3.5–5.1)
Sodium: 136 mmol/L (ref 135–145)
Sodium: 139 mmol/L (ref 135–145)

## 2015-01-22 LAB — URINALYSIS, ROUTINE W REFLEX MICROSCOPIC
Glucose, UA: NEGATIVE mg/dL
Ketones, ur: 15 mg/dL — AB
Leukocytes, UA: NEGATIVE
Nitrite: NEGATIVE
Protein, ur: 30 mg/dL — AB
Specific Gravity, Urine: 1.021 (ref 1.005–1.030)
Urobilinogen, UA: 0.2 mg/dL (ref 0.0–1.0)
pH: 5.5 (ref 5.0–8.0)

## 2015-01-22 LAB — URINE MICROSCOPIC-ADD ON

## 2015-01-22 LAB — CBC WITH DIFFERENTIAL/PLATELET
Basophils Absolute: 0 10*3/uL (ref 0.0–0.1)
Basophils Relative: 0 % (ref 0–1)
Eosinophils Absolute: 0.2 10*3/uL (ref 0.0–0.7)
Eosinophils Relative: 2 % (ref 0–5)
HCT: 45.4 % (ref 39.0–52.0)
Hemoglobin: 15.6 g/dL (ref 13.0–17.0)
Lymphocytes Relative: 11 % — ABNORMAL LOW (ref 12–46)
Lymphs Abs: 1 10*3/uL (ref 0.7–4.0)
MCH: 30.1 pg (ref 26.0–34.0)
MCHC: 34.4 g/dL (ref 30.0–36.0)
MCV: 87.6 fL (ref 78.0–100.0)
Monocytes Absolute: 0.7 10*3/uL (ref 0.1–1.0)
Monocytes Relative: 8 % (ref 3–12)
Neutro Abs: 7.1 10*3/uL (ref 1.7–7.7)
Neutrophils Relative %: 79 % — ABNORMAL HIGH (ref 43–77)
Platelets: 146 10*3/uL — ABNORMAL LOW (ref 150–400)
RBC: 5.18 MIL/uL (ref 4.22–5.81)
RDW: 13 % (ref 11.5–15.5)
WBC: 8.9 10*3/uL (ref 4.0–10.5)

## 2015-01-22 LAB — LACTIC ACID, PLASMA: Lactic Acid, Venous: 1.9 mmol/L (ref 0.5–2.0)

## 2015-01-22 LAB — TSH: TSH: 0.948 u[IU]/mL (ref 0.350–4.500)

## 2015-01-22 LAB — I-STAT CG4 LACTIC ACID, ED: Lactic Acid, Venous: 2.29 mmol/L (ref 0.5–2.0)

## 2015-01-22 LAB — TROPONIN I: Troponin I: <0.03 ng/mL (ref <0.031)

## 2015-01-22 MED ORDER — ONDANSETRON HCL 4 MG PO TABS: 4.0000 mg | ORAL_TABLET | Freq: Four times a day (QID) | ORAL | Status: DC | PRN

## 2015-01-22 MED ORDER — TAMSULOSIN HCL 0.4 MG PO CAPS
0.4000 mg | ORAL_CAPSULE | Freq: Every day | ORAL | Status: DC
  Administered 2015-01-22 – 2015-01-24 (×3): 0.4 mg via ORAL
  Filled 2015-01-22 (×3): qty 1

## 2015-01-22 MED ORDER — DORZOLAMIDE HCL 2 % OP SOLN
1.0000 [drp] | Freq: Two times a day (BID) | OPHTHALMIC | Status: DC
  Administered 2015-01-22 – 2015-01-24 (×5): 1 [drp] via OPHTHALMIC
  Filled 2015-01-22: qty 10

## 2015-01-22 MED ORDER — ALUM & MAG HYDROXIDE-SIMETH 200-200-20 MG/5ML PO SUSP: 30.0000 mL | Freq: Four times a day (QID) | ORAL | Status: DC | PRN

## 2015-01-22 MED ORDER — LATANOPROST 0.005 % OP SOLN
1.0000 [drp] | Freq: Every day | OPHTHALMIC | Status: DC
  Administered 2015-01-22 – 2015-01-23 (×2): 1 [drp] via OPHTHALMIC
  Filled 2015-01-22: qty 2.5

## 2015-01-22 MED ORDER — FLUOROMETHOLONE 0.1 % OP SUSP: 1.0000 [drp] | Freq: Two times a day (BID) | OPHTHALMIC | Status: DC

## 2015-01-22 MED ORDER — SODIUM CHLORIDE 0.9 % IV BOLUS (SEPSIS)
1000.0000 mL | Freq: Once | INTRAVENOUS | Status: AC
  Administered 2015-01-22: 1000 mL via INTRAVENOUS

## 2015-01-22 MED ORDER — SODIUM CHLORIDE 0.9 % IJ SOLN
3.0000 mL | Freq: Two times a day (BID) | INTRAMUSCULAR | Status: DC
  Administered 2015-01-22: 3 mL via INTRAVENOUS

## 2015-01-22 MED ORDER — ENOXAPARIN SODIUM 40 MG/0.4ML ~~LOC~~ SOLN
40.0000 mg | SUBCUTANEOUS | Status: DC
  Administered 2015-01-22 – 2015-01-23 (×2): 40 mg via SUBCUTANEOUS
  Filled 2015-01-22 (×2): qty 0.4

## 2015-01-22 MED ORDER — ONDANSETRON HCL 4 MG/2ML IJ SOLN: 4.0000 mg | Freq: Four times a day (QID) | INTRAMUSCULAR | Status: DC | PRN

## 2015-01-22 MED ORDER — PRAVASTATIN SODIUM 40 MG PO TABS
40.0000 mg | ORAL_TABLET | Freq: Every day | ORAL | Status: DC
  Administered 2015-01-22 – 2015-01-24 (×3): 40 mg via ORAL
  Filled 2015-01-22 (×3): qty 1

## 2015-01-22 MED ORDER — GABAPENTIN 300 MG PO CAPS
300.0000 mg | ORAL_CAPSULE | Freq: Two times a day (BID) | ORAL | Status: DC
  Administered 2015-01-22 – 2015-01-24 (×5): 300 mg via ORAL
  Filled 2015-01-22 (×5): qty 1

## 2015-01-22 MED ORDER — CETYLPYRIDINIUM CHLORIDE 0.05 % MT LIQD
7.0000 mL | Freq: Two times a day (BID) | OROMUCOSAL | Status: DC
  Administered 2015-01-23 (×2): 7 mL via OROMUCOSAL

## 2015-01-22 MED ORDER — ACETAMINOPHEN 650 MG RE SUPP
650.0000 mg | Freq: Four times a day (QID) | RECTAL | Status: DC | PRN
  Filled 2015-01-22: qty 1

## 2015-01-22 MED ORDER — ALLOPURINOL 300 MG PO TABS
300.0000 mg | ORAL_TABLET | Freq: Every day | ORAL | Status: DC
  Administered 2015-01-22 – 2015-01-24 (×3): 300 mg via ORAL
  Filled 2015-01-22 (×3): qty 1

## 2015-01-22 MED ORDER — OXYCODONE HCL 5 MG PO TABS: 5.0000 mg | ORAL_TABLET | ORAL | Status: DC | PRN

## 2015-01-22 MED ORDER — SODIUM CHLORIDE 0.9 % IV SOLN
INTRAVENOUS | Status: DC
  Administered 2015-01-22 – 2015-01-23 (×3): via INTRAVENOUS

## 2015-01-22 MED ORDER — METFORMIN HCL 500 MG PO TABS: 1000.0000 mg | ORAL_TABLET | Freq: Two times a day (BID) | ORAL | Status: DC

## 2015-01-22 MED ORDER — ACETAMINOPHEN 325 MG PO TABS: 650.0000 mg | ORAL_TABLET | Freq: Four times a day (QID) | ORAL | Status: DC | PRN

## 2015-01-22 NOTE — ED Provider Notes (Signed)
CSN: [id number]     Arrival date & time 01/22/15  0117 History  This chart was scribed for Ricky Severin, MD by Tanda Rockers, ED Scribe. This patient was seen in room A07C/A07C and the patient's care was started at 1:18 AM.    Chief Complaint  Patient presents with  . Loss of Consciousness   The history is provided by the patient and the EMS personnel. No language interpreter was used.     HPI Comments: [name] is a 81 y.o. male brought in by ambulance, who presents to the Emergency Department complaining of sudden onset, unresponsiveness that began tonight while at home with family. EMS states that pt's BP was 60/40 and he had 159 glucose. Pt was given glucagon and 500 fluid bolus en route. EMS reports that pt was unresponsive for approximately 30 minutes altogether and then came to. Pt denied taking any pain or sleep medication tonight. Wife notes pt previously taking unprescribed pain medication but denies taking anything tonight. Pt denies chest pain, abdominal pain, or any other associated symptoms.   Past Medical History  Diagnosis Date  . Gout     takes Allopurinol daily  . Sleep apnea   . Hyperlipidemia     takes Pravastatin daily  . Gastric ulcer     50 Years ago  . Arthritis     All Joints  . Insomnia     uses OTC sleep aide  . Glaucoma     uses eye drops daily  . Morbid obesity   . Hypertension     takes Amlodipine and Losartan daily  . Pleurisy 20+yrs ago  . Joint pain   . Joint swelling     right knee   . Peripheral edema   . Chronic back pain   . Constipation     takes an OTC med as needed  . History of colon polyps   . Urinary frequency     takes Flomax daily  . Nocturia   . Macular degeneration   . Legally blind   . Shortness of breath   . Diabetes mellitus     takes Metformin daily  . Syncope 09/07/2014   Past Surgical History  Procedure Laterality Date  . Cataract extraction w/ intraocular lens  implant, bilateral Bilateral   . Knee  arthroscopy Right     1990'  . Back surgery    . Glaucoma surgery Bilateral   . Mini shunt insertion  09/26/2011    Procedure: INSERTION OF MINI SHUNT;  Surgeon: Chalmers Guest, MD;  Location: [facility];  Service: Ophthalmology;  Laterality: Right;  Insertion of Ahmed shunt  . Neurostimulator placed  about 5yrs ago  . Colonoscopy    . Shoulder surgery Right 09/22/2013    REVERSAL    DR NORRIS  . Reverse shoulder arthroplasty Right 09/21/2013    Procedure: RIGHT  SHOULDER ARTHROPLASTY REVERSE ;  Surgeon: [name], MD;  Location: [facility] OR;  Service: Orthopedics;  Laterality: Right;  . Shoulder injection Left 09/21/2013    Procedure: SHOULDER INJECTION;  Surgeon: [name], MD;  Location: [facility] OR;  Service: Orthopedics;  Laterality: Left;   Family History  Problem Relation Age of Onset  . Anesthesia problems Neg Hx    Social History  Substance Use Topics  . Smoking status: Former Smoker -- 2.00 packs/day    Types: Cigarettes  . Smokeless tobacco: Never Used     Comment: quit smoking 22yrs ago  . Alcohol Use: Yes  Comment: rarely    Review of Systems  Cardiovascular: Negative for chest pain.  Gastrointestinal: Negative for abdominal pain.  Neurological:       Unresponsive  All other systems reviewed and are negative.  Allergies  Review of patient's allergies indicates no known allergies.  Home Medications   Prior to Admission medications   Medication Sig Start Date End Date Taking? Authorizing Provider  allopurinol (ZYLOPRIM) 300 MG tablet Take 300 mg by mouth daily.    Historical Provider, MD  amLODipine (NORVASC) 5 MG tablet Take 1 tablet (5 mg total) by mouth daily. 12/30/12   Richarda Overlie, MD  Brinzolamide-Brimonidine (SIMBRINZA) 1-0.2 % SUSP Place 1 drop into the left eye 3 (three) times daily.     Historical Provider, MD  colchicine 0.6 MG tablet Take 1 tablet by mouth as needed. 07/28/14   Historical Provider, MD  dorzolamide (TRUSOPT) 2 % ophthalmic solution Place 1  drop into the right eye 2 (two) times daily.     Historical Provider, MD  fluorometholone (FML) 0.1 % ophthalmic suspension Place 1 drop into both eyes 2 (two) times daily.  08/25/14   Historical Provider, MD  gabapentin (NEURONTIN) 300 MG capsule Take 1 capsule (300 mg total) by mouth 3 (three) times daily. Patient taking differently: Take 300 mg by mouth 2 (two) times daily.  12/30/12   Richarda Overlie, MD  ketoconazole (NIZORAL) 2 % cream Apply 1 application topically 2 (two) times daily as needed for irritation.     Historical Provider, MD  metFORMIN (GLUCOPHAGE) 1000 MG tablet Take 1,000 mg by mouth 2 (two) times daily with a meal.    Historical Provider, MD  oxyCODONE (OXY IR/ROXICODONE) 5 MG immediate release tablet Take 5 mg by mouth every 4 (four) hours as needed for severe pain.    Historical Provider, MD  Polyethyl Glycol-Propyl Glycol (SYSTANE OP) Place 1 drop into the left eye 3 (three) times daily.    Historical Provider, MD  pravastatin (PRAVACHOL) 40 MG tablet Take 40 mg by mouth daily.    Historical Provider, MD  Tamsulosin HCl (FLOMAX) 0.4 MG CAPS Take 0.4 mg by mouth daily.    Historical Provider, MD  TRAVATAN Z 0.004 % SOLN ophthalmic solution Place 1 drop into both eyes at bedtime.  08/19/14   Historical Provider, MD  VOLTAREN 1 % GEL Apply 2 g topically daily as needed.  09/06/14   Historical Provider, MD   SpO2 97%   Physical Exam  Constitutional: He is oriented to person, place, and time. He appears well-developed and well-nourished. No distress.  HENT:  Head: Normocephalic and atraumatic.  Nose: Nose normal.  Mouth/Throat: Oropharynx is clear and moist.  Eyes: Conjunctivae and EOM are normal. Left eye exhibits discharge (patient has discharge from left eye).  Patient is blind  Neck: Normal range of motion. Neck supple. No JVD present. No tracheal deviation present. No thyromegaly present.  Cardiovascular: Normal rate, regular rhythm, normal heart sounds and intact distal  pulses.  Exam reveals no gallop and no friction rub.   No murmur heard. Pulmonary/Chest: Effort normal and breath sounds normal. No stridor. No respiratory distress. He has no wheezes. He has no rales. He exhibits no tenderness.  Abdominal: Soft. Bowel sounds are normal. He exhibits no distension and no mass. There is no tenderness. There is no rebound and no guarding.  Musculoskeletal: Normal range of motion. He exhibits no edema or tenderness.  Lymphadenopathy:    He has no cervical adenopathy.  Neurological: He  is alert and oriented to person, place, and time. He displays normal reflexes. He exhibits normal muscle tone. Coordination normal.  Skin: Skin is warm and dry. No rash noted. No erythema. No pallor.  Psychiatric: He has a normal mood and affect. His behavior is normal. Judgment and thought content normal.  Nursing note and vitals reviewed.   ED Course  Procedures (including critical care time)  DIAGNOSTIC STUDIES: Oxygen Saturation is 97% on RA, normal by my interpretation.    COORDINATION OF CARE: 1:28 AM-Discussed treatment plan with pt at bedside and pt agreed to plan.   Labs Review Labs Reviewed  CBC WITH DIFFERENTIAL/PLATELET - Abnormal; Notable for the following:    Platelets 146 (*)    Neutrophils Relative % 79 (*)    Lymphocytes Relative 11 (*)    All other components within normal limits  BASIC METABOLIC PANEL - Abnormal; Notable for the following:    Glucose, Bld 252 (*)    Creatinine, Ser 1.79 (*)    GFR calc non Af Amer 34 (*)    GFR calc Af Amer 39 (*)    All other components within normal limits  URINALYSIS, ROUTINE W REFLEX MICROSCOPIC (NOT AT [facility]) - Abnormal; Notable for the following:    Hgb urine dipstick SMALL (*)    Bilirubin Urine SMALL (*)    Ketones, ur 15 (*)    Protein, ur 30 (*)    All other components within normal limits  I-STAT CG4 LACTIC ACID, ED - Abnormal; Notable for the following:    Lactic Acid, Venous 2.29 (*)    All other  components within normal limits  TROPONIN I  URINE MICROSCOPIC-ADD ON    Imaging Review Ct Head Wo Contrast  01/22/2015   CLINICAL DATA:  Syncope.  Unresponsive.  EXAM: CT HEAD WITHOUT CONTRAST  TECHNIQUE: Contiguous axial images were obtained from the base of the skull through the vertex without intravenous contrast.  COMPARISON:  09/06/2014  FINDINGS: Skull and Sinuses:Negative for fracture or destructive process. Mild mucosal edema in the paranasal sinuses.  Orbits: Bilateral cataract resection.  No acute finding.  Brain: No evidence of acute infarction, hemorrhage, hydrocephalus, or mass lesion/mass effect. Indistinct appearance of the bilateral putamen and caudate is stable and likely from dilated perivascular spaces by brain MRI. Remote lacunar infarcts noted in the genu of the left internal capsule and in the left putamen. Generalized cerebral volume loss which is typical for age. Low-density in the pons, rounded inferiorly and patchy superiorly is stable from previous head CT.  IMPRESSION: 1. Stable exam.  No evidence of acute intracranial disease. 2. Chronic small vessel disease and age related cerebral volume loss.   Electronically Signed   By: Marnee Spring M.D.   On: 01/22/2015 02:48   I have personally reviewed and evaluated these images and lab results as part of my medical decision-making.   EKG Interpretation   Date/Time:  Saturday January 22 2015 01:25:25 EDT Ventricular Rate:  84 PR Interval:  197 QRS Duration: 153 QT Interval:  427 QTC Calculation: 505 R Axis:   -68 Text Interpretation:  Sinus rhythm RBBB and LAFB No significant change  since last tracing Confirmed by Kendalyn Cranfield  MD, Jaquil Todt ([id number]) on 01/22/2015  2:20:22 AM      MDM   Final diagnoses:  Syncope and collapse    I personally performed the services described in this documentation, which was scribed in my presence. The recorded information has been reviewed and is accurate.  81 year old  male with prolonged  unresponsive episode similar to an episode back in April.  There is concern for possible self-medication with unknown pills.  Patient has access to home medications, but normally medications are given to him by his wife as patient is blind.  Patient given glucagon and Narcan with little response, but did have return of consciousness just prior to arrival after these medications were given.  Patient was significantly hypotensive on scene.  He remains orthostatic on exam.  His neuro exam is normal.  His creatinine is up, along with ketones noted in urine.  Patient received IV fluids for dehydration.  Case discussed with hospitalist who accepts patient for admission for observation for syncope.     Ricky Severin, MD 01/22/15 [phone]

## 2015-01-22 NOTE — H&P (Signed)
History and Physical  [name] NPY:[id number] DOB: Jan 18, 1931 DOA: 01/22/2015  Referring physician: Dr Norlene Campbell, ED physician PCP: [name], MD   Chief Complaint: Syncope  HPI: [name] is a 81 y.o. 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 Patient was brought to emergency department by EMS due to episode of unresponsiveness. The patient was with his family last night when he suddenly became unresponsive. The EMS was called and found that the patient's blood pressure was 60/40 with a glucose of 159. There is some concern that the patient had taken extra beta blocker and the patient was given glucagon and a 500 mL bolus of fluid. The patient remained unresponsive until just prior to arriving to the emergency department. In all, he was approximately 20-30 minutes unresponsive. The patient denies taking any extra medication for pain or sleep. The patient's wife notes that he has taken on prescribed pain medicine in the past but she did not observe him taking anything. Currently the patient does not have any symptoms.  He did have another episode similar to this in April. Workup was relatively negative the patient was discharged home off from one of his blood pressure medications which was subsequently restarted.   Review of Systems:   Pt denies any fevers, chills, nausea, vomiting, diarrhea, constipation, abdominal pain, chest pain, shortness of breath, dyspnea on exertion, headache, dizziness, lightheadedness.  Review of systems are otherwise negative  Past Medical History  Diagnosis Date  . Gout     takes Allopurinol daily  . Sleep apnea   . Hyperlipidemia     takes Pravastatin daily  . Gastric ulcer     50 Years ago  . Arthritis     All Joints  . Insomnia     uses OTC sleep aide  . Glaucoma     uses eye drops daily  . Morbid obesity   . Hypertension    takes Amlodipine and Losartan daily  . Pleurisy 20+yrs ago  . Joint pain   . Joint swelling     right knee   . Peripheral edema   . Chronic back pain   . Constipation     takes an OTC med as needed  . History of colon polyps   . Urinary frequency     takes Flomax daily  . Nocturia   . Macular degeneration   . Legally blind   . Shortness of breath   . Diabetes mellitus     takes Metformin daily  . Syncope 09/07/2014   Past Surgical History  Procedure Laterality Date  . Cataract extraction w/ intraocular lens  implant, bilateral Bilateral   . Knee arthroscopy Right     1990'  . Back surgery    . Glaucoma surgery Bilateral   . Mini shunt insertion  09/26/2011    Procedure: INSERTION OF MINI SHUNT;  Surgeon: Chalmers Guest, MD;  Location: [facility] OR;  Service: Ophthalmology;  Laterality: Right;  Insertion of Ahmed shunt  . Neurostimulator placed  about 8yrs ago  . Colonoscopy    . Shoulder surgery Right 09/22/2013    REVERSAL    DR NORRIS  . Reverse shoulder arthroplasty Right 09/21/2013    Procedure: RIGHT  SHOULDER ARTHROPLASTY REVERSE ;  Surgeon: [name], MD;  Location: [facility] OR;  Service: Orthopedics;  Laterality: Right;  . Shoulder injection Left 09/21/2013    Procedure: SHOULDER INJECTION;  Surgeon: [name],  MD;  Location: [facility] OR;  Service: Orthopedics;  Laterality: Left;   Social History:  reports that he has quit smoking. His smoking use included Cigarettes. He smoked 2.00 packs per day. He has never used smokeless tobacco. He reports that he drinks alcohol. He reports that he does not use illicit drugs. Patient lives at home & is able to participate in activities of daily living  No Known Allergies  Family History  Problem Relation Age of Onset  . Anesthesia problems Neg Hx       Prior to Admission medications   Medication Sig Start Date End Date Taking? Authorizing Provider  allopurinol (ZYLOPRIM) 300 MG tablet Take 300 mg by mouth daily.    Historical  Provider, MD  amLODipine (NORVASC) 5 MG tablet Take 1 tablet (5 mg total) by mouth daily. 12/30/12   Richarda Overlie, MD  Brinzolamide-Brimonidine (SIMBRINZA) 1-0.2 % SUSP Place 1 drop into the left eye 3 (three) times daily.     Historical Provider, MD  colchicine 0.6 MG tablet Take 1 tablet by mouth as needed. 07/28/14   Historical Provider, MD  dorzolamide (TRUSOPT) 2 % ophthalmic solution Place 1 drop into the right eye 2 (two) times daily.     Historical Provider, MD  fluorometholone (FML) 0.1 % ophthalmic suspension Place 1 drop into both eyes 2 (two) times daily.  08/25/14   Historical Provider, MD  gabapentin (NEURONTIN) 300 MG capsule Take 1 capsule (300 mg total) by mouth 3 (three) times daily. Patient taking differently: Take 300 mg by mouth 2 (two) times daily.  12/30/12   Richarda Overlie, MD  ketoconazole (NIZORAL) 2 % cream Apply 1 application topically 2 (two) times daily as needed for irritation.     Historical Provider, MD  metFORMIN (GLUCOPHAGE) 1000 MG tablet Take 1,000 mg by mouth 2 (two) times daily with a meal.    Historical Provider, MD  oxyCODONE (OXY IR/ROXICODONE) 5 MG immediate release tablet Take 5 mg by mouth every 4 (four) hours as needed for severe pain.    Historical Provider, MD  Polyethyl Glycol-Propyl Glycol (SYSTANE OP) Place 1 drop into the left eye 3 (three) times daily.    Historical Provider, MD  pravastatin (PRAVACHOL) 40 MG tablet Take 40 mg by mouth daily.    Historical Provider, MD  Tamsulosin HCl (FLOMAX) 0.4 MG CAPS Take 0.4 mg by mouth daily.    Historical Provider, MD  TRAVATAN Z 0.004 % SOLN ophthalmic solution Place 1 drop into both eyes at bedtime.  08/19/14   Historical Provider, MD  VOLTAREN 1 % GEL Apply 2 g topically daily as needed.  09/06/14   Historical Provider, MD    Physical Exam: BP 120/79 mmHg  Pulse 82  Temp(Src) 98.2 F (36.8 C) (Oral)  Resp 21  Ht 5\' 11"  (1.803 m)  Wt 105.1 kg (231 lb 11.3 oz)  BMI 32.33 kg/m2  SpO2 99%  General:  Elderly black male. Awake and alert and oriented x3. No acute cardiopulmonary distress.  Eyes: Extraocular muscles are intact. Sclerae anicteric and noninjected.  ENT:  Moist mucosal membranes. No mucosal lesions.   Neck: Neck supple without lymphadenopathy. No carotid bruits. No masses palpated.  Cardiovascular: Regular rate with normal S1-S2 sounds. No murmurs, rubs, gallops auscultated. No JVD.  Respiratory: Good respiratory effort with no wheezes, rales, rhonchi. Lungs clear to auscultation bilaterally.  Abdomen: Soft, nontender, nondistended. Active bowel sounds. No masses or hepatosplenomegaly  Skin: Dry, warm to touch. 2+ dorsalis pedis and radial pulses.  Musculoskeletal: No calf or leg pain. All major joints not erythematous nontender.  Psychiatric: Intact judgment and insight.  Neurologic: No focal neurological deficits. Cranial nerves II through XII are grossly intact.           Labs on Admission:  Basic Metabolic Panel:  Recent Labs Lab 01/22/15 0146  NA 136  K 3.5  CL 102  CO2 23  GLUCOSE 252*  BUN 18  CREATININE 1.79*  CALCIUM 9.0   Liver Function Tests: No results for input(s): AST, ALT, ALKPHOS, BILITOT, PROT, ALBUMIN in the last 168 hours. No results for input(s): LIPASE, AMYLASE in the last 168 hours. No results for input(s): AMMONIA in the last 168 hours. CBC:  Recent Labs Lab 01/22/15 0146  WBC 8.9  NEUTROABS 7.1  HGB 15.6  HCT 45.4  MCV 87.6  PLT 146*   Cardiac Enzymes:  Recent Labs Lab 01/22/15 0146  TROPONINI <0.03    BNP (last 3 results)  Recent Labs  09/07/14 0129  BNP 17.1    ProBNP (last 3 results) No results for input(s): PROBNP in the last 8760 hours.  CBG: No results for input(s): GLUCAP in the last 168 hours.  Radiological Exams on Admission: Ct Head Wo Contrast  01/22/2015   CLINICAL DATA:  Syncope.  Unresponsive.  EXAM: CT HEAD WITHOUT CONTRAST  TECHNIQUE: Contiguous axial images were obtained from the base of the  skull through the vertex without intravenous contrast.  COMPARISON:  09/06/2014  FINDINGS: Skull and Sinuses:Negative for fracture or destructive process. Mild mucosal edema in the paranasal sinuses.  Orbits: Bilateral cataract resection.  No acute finding.  Brain: No evidence of acute infarction, hemorrhage, hydrocephalus, or mass lesion/mass effect. Indistinct appearance of the bilateral putamen and caudate is stable and likely from dilated perivascular spaces by brain MRI. Remote lacunar infarcts noted in the genu of the left internal capsule and in the left putamen. Generalized cerebral volume loss which is typical for age. Low-density in the pons, rounded inferiorly and patchy superiorly is stable from previous head CT.  IMPRESSION: 1. Stable exam.  No evidence of acute intracranial disease. 2. Chronic small vessel disease and age related cerebral volume loss.   Electronically Signed   By: Marnee Spring M.D.   On: 01/22/2015 02:48    EKG: Independently reviewed. Sinus rhythm. Right bundle branch block, left fascicular block. No ST changes. Negative for STEMI  Assessment/Plan Present on Admission:  . Syncope and collapse . Hypotension . AKI (acute kidney injury) . Lactic acidosis  This patient was discussed with the ED physician, including pertinent vitals, physical exam findings, labs, and imaging.  We also discussed care given by the ED provider.  #1 significant collapse #2 hypertension #3 AKI #4 lactic acidosis #5 diabetes  Admit to telemetry for observation  IV fluids the next hours  Recheck creatinine  Hold antihypertensives - there is some concern that the medication list provided is not complete. The patient is unaware of his medications. His wife is supposed to return with this medication last  Echocardiogram  DVT prophylaxis: Lovenox  Consultants: None  Code Status: Full code  Family Communication: None   Disposition Plan: Observation   Levie Heritage,  DO [facility] Pager [phone]

## 2015-01-22 NOTE — Progress Notes (Signed)
Echocardiogram 2D Echocardiogram has been performed.  Dorothey Baseman 01/22/2015, 1:02 PM

## 2015-01-22 NOTE — Progress Notes (Signed)
[facility] PROGRESS NOTE Assessment/Plan: AKI (acute kidney injury): - Baseline creatinine less than 1, likely prerenal azotemia. - Antihypertensive medications were held. Echocardiogram is pending.  Lactic acidosis: - Has remained afebrile no leukocytosis. - Likely due to hypovolemia.  Syncope and collapse: - Question due to orthostatic hypotension check vitals.  Hypotension - Antihypertensive medications were held, this has resolved.    Code Status: full Family Communication: none  Disposition Plan: inpatient   Consultants:  none  Procedures:  CT head  Antibiotics:  None  HPI/Subjective: Patient is sleepy wishes not to talk and go back to sleep.  Objective: Filed Vitals:   01/22/15 0345 01/22/15 0415 01/22/15 0445 01/22/15 0541  BP: 122/67 119/68 128/72 120/79  Pulse: 87 86 85 82  Temp:    98.2 F (36.8 C)  TempSrc:    Oral  Resp: Height:      Weight:    105.1 kg (231 lb 11.3 oz)  SpO2: 95% 94% 93% 99%   No intake or output data in the 24 hours ending 01/22/15 0754 Filed Weights   01/22/15 0134 01/22/15 0541  Weight: 104.327 kg (230 lb) 105.1 kg (231 lb 11.3 oz)    Exam:  General: Alert, awake, oriented x3, in no acute distress.  HEENT: Refused physical exam   Data Reviewed: Basic Metabolic Panel:  Recent Labs Lab 01/22/15 0146  NA 136  K 3.5  CL 102  CO2 23  GLUCOSE 252*  BUN 18  CREATININE 1.79*  CALCIUM 9.0   Liver Function Tests: No results for input(s): AST, ALT, ALKPHOS, BILITOT, PROT, ALBUMIN in the last 168 hours. No results for input(s): LIPASE, AMYLASE in the last 168 hours. No results for input(s): AMMONIA in the last 168 hours. CBC:  Recent Labs Lab 01/22/15 0146  WBC 8.9  NEUTROABS 7.1  HGB 15.6  HCT 45.4  MCV 87.6  PLT 146*   Cardiac Enzymes:  Recent Labs Lab 01/22/15 0146  TROPONINI <0.03   BNP (last 3 results)  Recent Labs  09/07/14 0129  BNP 17.1    ProBNP (last 3  results) No results for input(s): PROBNP in the last 8760 hours.  CBG: No results for input(s): GLUCAP in the last 168 hours.  No results found for this or any previous visit (from the past 240 hour(s)).   Studies: Ct Head Wo Contrast  01/22/2015   CLINICAL DATA:  Syncope.  Unresponsive.  EXAM: CT HEAD WITHOUT CONTRAST  TECHNIQUE: Contiguous axial images were obtained from the base of the skull through the vertex without intravenous contrast.  COMPARISON:  09/06/2014  FINDINGS: Skull and Sinuses:Negative for fracture or destructive process. Mild mucosal edema in the paranasal sinuses.  Orbits: Bilateral cataract resection.  No acute finding.  Brain: No evidence of acute infarction, hemorrhage, hydrocephalus, or mass lesion/mass effect. Indistinct appearance of the bilateral putamen and caudate is stable and likely from dilated perivascular spaces by brain MRI. Remote lacunar infarcts noted in the genu of the left internal capsule and in the left putamen. Generalized cerebral volume loss which is typical for age. Low-density in the pons, rounded inferiorly and patchy superiorly is stable from previous head CT.  IMPRESSION: 1. Stable exam.  No evidence of acute intracranial disease. 2. Chronic small vessel disease and age related cerebral volume loss.   Electronically Signed   By: Marnee Spring M.D.   On: 01/22/2015 02:48    Scheduled Meds: . allopurinol  300 mg Oral Daily  .  dorzolamide  1 drop Right Eye BID  . enoxaparin (LOVENOX) injection  40 mg Subcutaneous Q24H  . fluorometholone  1 drop Both Eyes BID  . gabapentin  300 mg Oral BID  . latanoprost  1 drop Both Eyes QHS  . pravastatin  40 mg Oral Daily  . sodium chloride  3 mL Intravenous Q12H  . tamsulosin  0.4 mg Oral Daily   Continuous Infusions: . sodium chloride 125 mL/hr at 01/22/15 1829    Time Spent: 25 min   Marinda Elk  [facility] Pager [phone]. If 7PM-7AM, please contact night-coverage at  [email or web address], password [facility] 01/22/2015, 7:54 AM

## 2015-01-22 NOTE — Progress Notes (Signed)
PHARMACIST - PHYSICIAN COMMUNICATION DR:  Stinson CONCERNING:  METFORMIN SAFE ADMINISTRATION POLICY  RECOMMENDATION: Metformin has been placed on DISCONTINUE (rejected order) STATUS and should be reordered only after any of the conditions below are ruled out.  Current safety recommendations include avoiding metformin for a minimum of 48 hours after the patient's exposure to intravenous contrast media.  DESCRIPTION:  The Pharmacy Committee has adopted a policy that restricts the use of metformin in hospitalized patients until all the contraindications to administration have been ruled out. Specific contraindications are: [] Serum creatinine ? 1.5 for males [x] Serum creatinine ? 1.4 for females [] Shock, acute MI, sepsis, hypoxemia, dehydration [] Planned administration of intravenous iodinated contrast media [] Heart Failure patients with low EF [] Acute or chronic metabolic acidosis (including DKA)      

## 2015-01-22 NOTE — ED Notes (Signed)
Per GCMS, pt. Was found unresponsive in home and would not awake to pain. Pt. Hypotensive at scene and given fluid bolus , glucagon, and narcan. After being unconscious for 20 minutes, pt awoke aaox4 and normotensive.

## 2015-01-22 NOTE — ED Notes (Signed)
Iphone, two debit cards, set of keys, shorts, and shoes at bedside

## 2015-01-23 DIAGNOSIS — H548 Legal blindness, as defined in USA: Secondary | ICD-10-CM | POA: Diagnosis not present

## 2015-01-23 DIAGNOSIS — Z6832 Body mass index (BMI) 32.0-32.9, adult: Secondary | ICD-10-CM | POA: Diagnosis not present

## 2015-01-23 DIAGNOSIS — I10 Essential (primary) hypertension: Secondary | ICD-10-CM | POA: Diagnosis not present

## 2015-01-23 DIAGNOSIS — N179 Acute kidney failure, unspecified: Secondary | ICD-10-CM | POA: Diagnosis not present

## 2015-01-23 DIAGNOSIS — I951 Orthostatic hypotension: Secondary | ICD-10-CM | POA: Diagnosis not present

## 2015-01-23 DIAGNOSIS — E669 Obesity, unspecified: Secondary | ICD-10-CM | POA: Diagnosis not present

## 2015-01-23 DIAGNOSIS — E872 Acidosis: Secondary | ICD-10-CM | POA: Diagnosis not present

## 2015-01-23 DIAGNOSIS — R55 Syncope and collapse: Secondary | ICD-10-CM | POA: Diagnosis not present

## 2015-01-23 DIAGNOSIS — I959 Hypotension, unspecified: Secondary | ICD-10-CM | POA: Diagnosis not present

## 2015-01-23 DIAGNOSIS — E119 Type 2 diabetes mellitus without complications: Secondary | ICD-10-CM | POA: Diagnosis not present

## 2015-01-23 LAB — BASIC METABOLIC PANEL
Anion gap: 7 (ref 5–15)
BUN: 13 mg/dL (ref 6–20)
CO2: 26 mmol/L (ref 22–32)
Calcium: 8.7 mg/dL — ABNORMAL LOW (ref 8.9–10.3)
Chloride: 106 mmol/L (ref 101–111)
Creatinine, Ser: 1.2 mg/dL (ref 0.61–1.24)
GFR calc Af Amer: >60 mL/min (ref >60)
GFR calc non Af Amer: 55 mL/min — ABNORMAL LOW (ref >60)
Glucose, Bld: 108 mg/dL — ABNORMAL HIGH (ref 65–99)
Potassium: 3.8 mmol/L (ref 3.5–5.1)
Sodium: 139 mmol/L (ref 135–145)

## 2015-01-23 LAB — GLUCOSE, CAPILLARY
Glucose-Capillary: 100 mg/dL — ABNORMAL HIGH (ref 65–99)
Glucose-Capillary: 141 mg/dL — ABNORMAL HIGH (ref 65–99)

## 2015-01-23 MED ORDER — AMLODIPINE BESYLATE 5 MG PO TABS
5.0000 mg | ORAL_TABLET | Freq: Every day | ORAL | Status: DC
  Administered 2015-01-23 – 2015-01-24 (×2): 5 mg via ORAL
  Filled 2015-01-23 (×2): qty 1

## 2015-01-23 NOTE — Progress Notes (Signed)
Disregard the note

## 2015-01-23 NOTE — Progress Notes (Signed)
[facility] PROGRESS NOTE Assessment/Plan: AKI (acute kidney injury): - Baseline creatinine less than 1, likely prerenal azotemia. - Echocardiogram showed an EF of 60% with grade 1 diastolic heart failure. - Patient basic metabolic panels pending.  Lactic acidosis: - Now resolved, likely due to hypovolemic episode.  Syncope and collapse: - Question due to hypovolemic episode.  Essential hypertension - Antihypertensive medications were held on admission. Now his blood pressure is elevated I will go ahead and start Norvasc.    Code Status: full Family Communication: none  Disposition Plan: home in am.   Consultants:  none  Procedures:  CT head  Antibiotics:  None  HPI/Subjective: Patient has no new complaints, he relates he feels back to baseline. Possible home today.  Objective: Filed Vitals:   01/22/15 0541 01/22/15 1407 01/22/15 2156 01/23/15 0530  BP: 120/79 157/87 135/81 154/93  Pulse: 82 87 92 93  Temp: 98.2 F (36.8 C) 98.1 F (36.7 C) 99.4 F (37.4 C) 98.4 F (36.9 C)  TempSrc: Oral Oral Oral Oral  Resp: Height:      Weight: 105.1 kg (231 lb 11.3 oz)     SpO2: 99% 99% 96% 98%    Intake/Output Summary (Last 24 hours) at 01/23/15 0951 Last data filed at 01/23/15 0654  Gross per 24 hour  Intake 1684.58 ml  Output   1375 ml  Net 309.58 ml   Filed Weights   01/22/15 0134 01/22/15 0541  Weight: 104.327 kg (230 lb) 105.1 kg (231 lb 11.3 oz)    Exam:  General: Alert, awake, oriented x3, in no acute distress.  HEENT: Moist membranes Lungs: Good air movement clear to auscultation. Cardiovascular has regular rate and rhythm. Abdomen: Soft nontender nondistended positive bowel sounds.   Data Reviewed: Basic Metabolic Panel:  Recent Labs Lab 01/22/15 0146 01/22/15 0735  NA 136 139  K 3.5 4.0  CL 102 105  CO2 23 26  GLUCOSE 252* 101*  BUN 18 20  CREATININE 1.79* 1.58*  CALCIUM 9.0 8.5*   Liver Function  Tests: No results for input(s): AST, ALT, ALKPHOS, BILITOT, PROT, ALBUMIN in the last 168 hours. No results for input(s): LIPASE, AMYLASE in the last 168 hours. No results for input(s): AMMONIA in the last 168 hours. CBC:  Recent Labs Lab 01/22/15 0146  WBC 8.9  NEUTROABS 7.1  HGB 15.6  HCT 45.4  MCV 87.6  PLT 146*   Cardiac Enzymes:  Recent Labs Lab 01/22/15 0146  TROPONINI <0.03   BNP (last 3 results)  Recent Labs  09/07/14 0129  BNP 17.1    ProBNP (last 3 results) No results for input(s): PROBNP in the last 8760 hours.  CBG:  Recent Labs Lab 01/23/15 0641 01/23/15 0848  GLUCAP 100* 141*    No results found for this or any previous visit (from the past 240 hour(s)).   Studies: Ct Head Wo Contrast  01/22/2015   CLINICAL DATA:  Syncope.  Unresponsive.  EXAM: CT HEAD WITHOUT CONTRAST  TECHNIQUE: Contiguous axial images were obtained from the base of the skull through the vertex without intravenous contrast.  COMPARISON:  09/06/2014  FINDINGS: Skull and Sinuses:Negative for fracture or destructive process. Mild mucosal edema in the paranasal sinuses.  Orbits: Bilateral cataract resection.  No acute finding.  Brain: No evidence of acute infarction, hemorrhage, hydrocephalus, or mass lesion/mass effect. Indistinct appearance of the bilateral putamen and caudate is stable and likely from dilated perivascular spaces by brain MRI. Remote lacunar infarcts  noted in the genu of the left internal capsule and in the left putamen. Generalized cerebral volume loss which is typical for age. Low-density in the pons, rounded inferiorly and patchy superiorly is stable from previous head CT.  IMPRESSION: 1. Stable exam.  No evidence of acute intracranial disease. 2. Chronic small vessel disease and age related cerebral volume loss.   Electronically Signed   By: Marnee Spring M.D.   On: 01/22/2015 02:48    Scheduled Meds: . allopurinol  300 mg Oral Daily  . antiseptic oral rinse  7  mL Mouth Rinse BID  . dorzolamide  1 drop Right Eye BID  . enoxaparin (LOVENOX) injection  40 mg Subcutaneous Q24H  . gabapentin  300 mg Oral BID  . latanoprost  1 drop Both Eyes QHS  . pravastatin  40 mg Oral Daily  . sodium chloride  3 mL Intravenous Q12H  . tamsulosin  0.4 mg Oral Daily   Continuous Infusions: . sodium chloride 125 mL/hr at 01/23/15 0438    Time Spent: 25 min   Marinda Elk  [facility] Pager [phone]. If 7PM-7AM, please contact night-coverage at [email or web address], password [facility] 01/23/2015, 9:51 AM

## 2015-01-24 DIAGNOSIS — E669 Obesity, unspecified: Secondary | ICD-10-CM | POA: Diagnosis not present

## 2015-01-24 DIAGNOSIS — E119 Type 2 diabetes mellitus without complications: Secondary | ICD-10-CM | POA: Diagnosis not present

## 2015-01-24 DIAGNOSIS — I951 Orthostatic hypotension: Secondary | ICD-10-CM | POA: Diagnosis not present

## 2015-01-24 DIAGNOSIS — Z6832 Body mass index (BMI) 32.0-32.9, adult: Secondary | ICD-10-CM | POA: Diagnosis not present

## 2015-01-24 DIAGNOSIS — I10 Essential (primary) hypertension: Secondary | ICD-10-CM | POA: Diagnosis not present

## 2015-01-24 DIAGNOSIS — R55 Syncope and collapse: Secondary | ICD-10-CM | POA: Diagnosis not present

## 2015-01-24 DIAGNOSIS — E872 Acidosis: Secondary | ICD-10-CM | POA: Diagnosis not present

## 2015-01-24 DIAGNOSIS — H548 Legal blindness, as defined in USA: Secondary | ICD-10-CM | POA: Diagnosis not present

## 2015-01-24 DIAGNOSIS — I959 Hypotension, unspecified: Secondary | ICD-10-CM | POA: Diagnosis not present

## 2015-01-24 DIAGNOSIS — N179 Acute kidney failure, unspecified: Secondary | ICD-10-CM | POA: Diagnosis not present

## 2015-01-24 LAB — BASIC METABOLIC PANEL
Anion gap: 7 (ref 5–15)
BUN: 10 mg/dL (ref 6–20)
CO2: 28 mmol/L (ref 22–32)
Calcium: 8.7 mg/dL — ABNORMAL LOW (ref 8.9–10.3)
Chloride: 105 mmol/L (ref 101–111)
Creatinine, Ser: 1.11 mg/dL (ref 0.61–1.24)
GFR calc Af Amer: >60 mL/min (ref >60)
GFR calc non Af Amer: >60 mL/min (ref >60)
Glucose, Bld: 109 mg/dL — ABNORMAL HIGH (ref 65–99)
Potassium: 3.9 mmol/L (ref 3.5–5.1)
Sodium: 140 mmol/L (ref 135–145)

## 2015-01-24 LAB — GLUCOSE, CAPILLARY: Glucose-Capillary: 102 mg/dL — ABNORMAL HIGH (ref 65–99)

## 2015-01-24 MED ORDER — VOLTAREN 1 % TD GEL: 2.0000 g | Freq: Every day | TRANSDERMAL | Status: DC | PRN

## 2015-01-24 NOTE — Progress Notes (Signed)
01/24/2015  1400 Pt. States he is currently taking Neurontin 800mg  at home not 300mg . Dr. Robb Matar paged to update AVS. Orders received for pt. To continue 800mg  dose. AVS updated to reflect this information. Pt. Educated on new instructions. Pt. Verbalized understanding.  [name], [name]

## 2015-01-24 NOTE — Progress Notes (Signed)
01/24/2015 2:46 PM D/c avs form, medications already taken today and those due this evening given and explained to patient. Follow up appointments and when to call MD reviewed. RX reviewed. D/c iv. D/c tele. D/c home per orders.  Saladin Petrelli, [name]

## 2015-01-24 NOTE — Discharge Summary (Addendum)
Physician Discharge Summary  [name] RNH:[id number] DOB: Feb 13, 1931 DOA: 01/22/2015  PCP: [name], MD  Admit date: 01/22/2015 Discharge date: 01/24/2015  Time spent: 35  minutes  Recommendations for Outpatient Follow-up:  1. Follow-up with primary care doctor in 1-2 weeks check a basic metabolic panel titrate antihypertensive medications as tolerated.  Discharge Diagnoses:  Active Problems:   Hypotension   Syncope and collapse   AKI (acute kidney injury)   Lactic acidosis   Discharge Condition: stable  Diet recommendation: regular  Filed Weights   01/22/15 0134 01/22/15 0541  Weight: 104.327 kg (230 lb) 105.1 kg (231 lb 11.3 oz)    History of present illness:  81 year old male legally blind with past medical history of essential hypertension and glaucoma that comes in as patient was found unresponsive by his family and EMS was called.  Hospital Course:  Syncope and collapse: Likely due to vasovagal versus hypovolemia. Orthostatics vitals on admission were positive. He was monitored on telemetry with no events, cardiac markers were negative 3. He denies any prodromal symptoms EKG showed no sig significant abnormalities. He has not been started on any new medications, he is nonfocal on physical exam.  Acute kidney injury: Likely due to prerenal azotemia, he was started on IV fluid hydration antihypertensive medications were held and his creatinine returned to baseline.  Lactic acidosis: Resolved with IV fluids likely due to hypovolemia.  Essential hypertension: Antihypertensive medications were held on admission these could be resumed as an outpatient. No changes were made.     Procedures: CXR  Consultations:  none  Discharge Exam: Filed Vitals:   01/24/15 0954  BP: 166/101  Pulse:   Temp:   Resp:     General: A&O x3 Cardiovascular: RRR Respiratory: good air movement CTA B/l  Discharge Instructions   Discharge Instructions    Diet  - low sodium heart healthy    Complete by:  As directed      Increase activity slowly    Complete by:  As directed           Current Discharge Medication List    CONTINUE these medications which have NOT CHANGED   Details  allopurinol (ZYLOPRIM) 300 MG tablet Take 300 mg by mouth daily.    amLODipine (NORVASC) 5 MG tablet Take 1 tablet (5 mg total) by mouth daily. Qty: 30 tablet, Refills: 0    augmented betamethasone dipropionate (DIPROLENE-AF) 0.05 % cream Apply 1 application topically daily.     Brinzolamide-Brimonidine (SIMBRINZA) 1-0.2 % SUSP Place 1 drop into the left eye 3 (three) times daily.     clobetasol cream (TEMOVATE) 0.05 % Apply 1 application topically 2 (two) times daily as needed (for sores on arm). NOT to face, groin, or underarms Refills: 3    dorzolamide (TRUSOPT) 2 % ophthalmic solution Place 1 drop into the right eye 2 (two) times daily.     ketoconazole (NIZORAL) 2 % cream Apply 1 application topically 2 (two) times daily as needed for irritation.     metFORMIN (GLUCOPHAGE) 1000 MG tablet Take 1,000 mg by mouth 2 (two) times daily with a meal.    Polyethyl Glycol-Propyl Glycol (SYSTANE OP) Place 1 drop into the left eye 3 (three) times daily.    pravastatin (PRAVACHOL) 40 MG tablet Take 40 mg by mouth daily.    Tamsulosin HCl (FLOMAX) 0.4 MG CAPS Take 0.4 mg by mouth daily.    TRAVATAN Z 0.004 % SOLN ophthalmic solution Place 1 drop into both eyes at bedtime.  Refills: 6    VOLTAREN 1 % GEL Apply 2 g topically daily as needed (for pain).     colchicine 0.6 MG tablet Take 1 tablet by mouth daily as needed (for acute gout attack).  Refills: 0    fluorometholone (FML) 0.1 % ophthalmic suspension Place 1 drop into both eyes 2 (two) times daily.  Refills: 6    gabapentin (NEURONTIN) 300 MG capsule Take 1 capsule (300 mg total) by mouth 3 (three) times daily. Qty: 90 capsule, Refills: 2    oxyCODONE (OXY IR/ROXICODONE) 5 MG immediate release tablet  Take 5 mg by mouth every 4 (four) hours as needed for severe pain.      STOP taking these medications     gabapentin (NEURONTIN) 800 MG tablet        No Known Allergies Follow-up Information    Follow up with [name], MD In 2 weeks.   Specialty:  Family Medicine   Why:  Hospital follow-up check a basic metabolic panel and titrate antihypertensive medications as tolerated.   Contact information:   [address] [place] [address] [phone]        The results of significant diagnostics from this hospitalization (including imaging, microbiology, ancillary and laboratory) are listed below for reference.    Significant Diagnostic Studies: Ct Head Wo Contrast  01/22/2015   CLINICAL DATA:  Syncope.  Unresponsive.  EXAM: CT HEAD WITHOUT CONTRAST  TECHNIQUE: Contiguous axial images were obtained from the base of the skull through the vertex without intravenous contrast.  COMPARISON:  09/06/2014  FINDINGS: Skull and Sinuses:Negative for fracture or destructive process. Mild mucosal edema in the paranasal sinuses.  Orbits: Bilateral cataract resection.  No acute finding.  Brain: No evidence of acute infarction, hemorrhage, hydrocephalus, or mass lesion/mass effect. Indistinct appearance of the bilateral putamen and caudate is stable and likely from dilated perivascular spaces by brain MRI. Remote lacunar infarcts noted in the genu of the left internal capsule and in the left putamen. Generalized cerebral volume loss which is typical for age. Low-density in the pons, rounded inferiorly and patchy superiorly is stable from previous head CT.  IMPRESSION: 1. Stable exam.  No evidence of acute intracranial disease. 2. Chronic small vessel disease and age related cerebral volume loss.   Electronically Signed   By: Marnee Spring M.D.   On: 01/22/2015 02:48    Microbiology: No results found for this or any previous visit (from the past 240 hour(s)).   Labs: Basic  Metabolic Panel:  Recent Labs Lab 01/22/15 0146 01/22/15 0735 01/23/15 1124 01/24/15 0327  NA 136 139 139 140  K 3.5 4.0 3.8 3.9  CL 102 105 106 105  CO2 GLUCOSE 252* 101* 108* 109*  BUN CREATININE 1.79* 1.58* 1.20 1.11  CALCIUM 9.0 8.5* 8.7* 8.7*   Liver Function Tests: No results for input(s): AST, ALT, ALKPHOS, BILITOT, PROT, ALBUMIN in the last 168 hours. No results for input(s): LIPASE, AMYLASE in the last 168 hours. No results for input(s): AMMONIA in the last 168 hours. CBC:  Recent Labs Lab 01/22/15 0146  WBC 8.9  NEUTROABS 7.1  HGB 15.6  HCT 45.4  MCV 87.6  PLT 146*   Cardiac Enzymes:  Recent Labs Lab 01/22/15 0146  TROPONINI <0.03   BNP: BNP (last 3 results)  Recent Labs  09/07/14 0129  BNP 17.1    ProBNP (last 3 results) No results for input(s): PROBNP in  the last 8760 hours.  CBG:  Recent Labs Lab 01/23/15 0641 01/23/15 0848 01/24/15 0613  GLUCAP 100* 141* 102*     Signed:  Marinda Elk  [facility] 01/24/2015, 10:33 AM

## 2015-01-31 DIAGNOSIS — R195 Other fecal abnormalities: Secondary | ICD-10-CM | POA: Diagnosis not present

## 2015-01-31 DIAGNOSIS — R350 Frequency of micturition: Secondary | ICD-10-CM | POA: Diagnosis not present

## 2015-03-01 DIAGNOSIS — H18231 Secondary corneal edema, right eye: Secondary | ICD-10-CM | POA: Diagnosis not present

## 2015-03-01 DIAGNOSIS — H34819 Central retinal vein occlusion, unspecified eye: Secondary | ICD-10-CM | POA: Diagnosis not present

## 2015-03-01 DIAGNOSIS — H47233 Glaucomatous optic atrophy, bilateral: Secondary | ICD-10-CM | POA: Diagnosis not present

## 2015-03-03 DIAGNOSIS — E1151 Type 2 diabetes mellitus with diabetic peripheral angiopathy without gangrene: Secondary | ICD-10-CM | POA: Diagnosis not present

## 2015-03-03 DIAGNOSIS — I739 Peripheral vascular disease, unspecified: Secondary | ICD-10-CM | POA: Diagnosis not present

## 2015-03-03 DIAGNOSIS — L603 Nail dystrophy: Secondary | ICD-10-CM | POA: Diagnosis not present

## 2015-04-06 DIAGNOSIS — Z23 Encounter for immunization: Secondary | ICD-10-CM | POA: Diagnosis not present

## 2015-04-06 DIAGNOSIS — G894 Chronic pain syndrome: Secondary | ICD-10-CM | POA: Diagnosis not present

## 2015-05-12 ENCOUNTER — Ambulatory Visit (INDEPENDENT_AMBULATORY_CARE_PROVIDER_SITE_OTHER): Payer: Commercial Managed Care - HMO | Admitting: Ophthalmology

## 2015-05-18 ENCOUNTER — Ambulatory Visit (INDEPENDENT_AMBULATORY_CARE_PROVIDER_SITE_OTHER): Payer: Medicare HMO | Admitting: Ophthalmology

## 2015-05-19 DIAGNOSIS — L603 Nail dystrophy: Secondary | ICD-10-CM | POA: Diagnosis not present

## 2015-05-19 DIAGNOSIS — E1151 Type 2 diabetes mellitus with diabetic peripheral angiopathy without gangrene: Secondary | ICD-10-CM | POA: Diagnosis not present

## 2015-05-19 DIAGNOSIS — I739 Peripheral vascular disease, unspecified: Secondary | ICD-10-CM | POA: Diagnosis not present

## 2015-05-31 DIAGNOSIS — H47233 Glaucomatous optic atrophy, bilateral: Secondary | ICD-10-CM | POA: Diagnosis not present

## 2015-05-31 DIAGNOSIS — H348111 Central retinal vein occlusion, right eye, with retinal neovascularization: Secondary | ICD-10-CM | POA: Diagnosis not present

## 2015-05-31 DIAGNOSIS — H18231 Secondary corneal edema, right eye: Secondary | ICD-10-CM | POA: Diagnosis not present

## 2015-06-08 ENCOUNTER — Ambulatory Visit (INDEPENDENT_AMBULATORY_CARE_PROVIDER_SITE_OTHER): Payer: Commercial Managed Care - HMO | Admitting: Ophthalmology

## 2015-06-15 DIAGNOSIS — E11621 Type 2 diabetes mellitus with foot ulcer: Secondary | ICD-10-CM | POA: Diagnosis not present

## 2015-06-17 DIAGNOSIS — E11621 Type 2 diabetes mellitus with foot ulcer: Secondary | ICD-10-CM | POA: Diagnosis not present

## 2015-06-20 DIAGNOSIS — L97509 Non-pressure chronic ulcer of other part of unspecified foot with unspecified severity: Secondary | ICD-10-CM | POA: Diagnosis not present

## 2015-07-14 ENCOUNTER — Ambulatory Visit (INDEPENDENT_AMBULATORY_CARE_PROVIDER_SITE_OTHER): Payer: Commercial Managed Care - HMO | Admitting: Ophthalmology

## 2015-07-14 DIAGNOSIS — H353122 Nonexudative age-related macular degeneration, left eye, intermediate dry stage: Secondary | ICD-10-CM

## 2015-07-14 DIAGNOSIS — H43812 Vitreous degeneration, left eye: Secondary | ICD-10-CM

## 2015-07-14 DIAGNOSIS — H35033 Hypertensive retinopathy, bilateral: Secondary | ICD-10-CM | POA: Diagnosis not present

## 2015-07-14 DIAGNOSIS — I10 Essential (primary) hypertension: Secondary | ICD-10-CM | POA: Diagnosis not present

## 2015-08-30 DIAGNOSIS — H348111 Central retinal vein occlusion, right eye, with retinal neovascularization: Secondary | ICD-10-CM | POA: Diagnosis not present

## 2015-08-30 DIAGNOSIS — H18231 Secondary corneal edema, right eye: Secondary | ICD-10-CM | POA: Diagnosis not present

## 2015-08-30 DIAGNOSIS — H401133 Primary open-angle glaucoma, bilateral, severe stage: Secondary | ICD-10-CM | POA: Diagnosis not present

## 2015-09-09 ENCOUNTER — Inpatient Hospital Stay: Payer: Commercial Managed Care - HMO | Admitting: Physical Medicine & Rehabilitation

## 2015-09-09 ENCOUNTER — Ambulatory Visit: Payer: Commercial Managed Care - HMO

## 2015-09-26 ENCOUNTER — Inpatient Hospital Stay: Payer: Commercial Managed Care - HMO | Admitting: Physical Medicine & Rehabilitation

## 2015-09-26 ENCOUNTER — Ambulatory Visit: Payer: Commercial Managed Care - HMO

## 2015-10-06 ENCOUNTER — Inpatient Hospital Stay: Payer: Commercial Managed Care - HMO | Admitting: Physical Medicine & Rehabilitation

## 2015-10-06 ENCOUNTER — Encounter: Payer: Commercial Managed Care - HMO | Attending: Physical Medicine & Rehabilitation

## 2015-10-07 DIAGNOSIS — K0889 Other specified disorders of teeth and supporting structures: Secondary | ICD-10-CM | POA: Diagnosis not present

## 2015-10-10 DIAGNOSIS — R600 Localized edema: Secondary | ICD-10-CM | POA: Diagnosis not present

## 2015-10-10 DIAGNOSIS — M109 Gout, unspecified: Secondary | ICD-10-CM | POA: Diagnosis not present

## 2015-10-10 DIAGNOSIS — Z7984 Long term (current) use of oral hypoglycemic drugs: Secondary | ICD-10-CM | POA: Diagnosis not present

## 2015-10-10 DIAGNOSIS — H409 Unspecified glaucoma: Secondary | ICD-10-CM | POA: Diagnosis not present

## 2015-10-10 DIAGNOSIS — N400 Enlarged prostate without lower urinary tract symptoms: Secondary | ICD-10-CM | POA: Diagnosis not present

## 2015-10-10 DIAGNOSIS — I10 Essential (primary) hypertension: Secondary | ICD-10-CM | POA: Diagnosis not present

## 2015-10-10 DIAGNOSIS — G4733 Obstructive sleep apnea (adult) (pediatric): Secondary | ICD-10-CM | POA: Diagnosis not present

## 2015-10-10 DIAGNOSIS — E1122 Type 2 diabetes mellitus with diabetic chronic kidney disease: Secondary | ICD-10-CM | POA: Diagnosis not present

## 2015-10-10 DIAGNOSIS — N183 Chronic kidney disease, stage 3 (moderate): Secondary | ICD-10-CM | POA: Diagnosis not present

## 2015-10-18 DIAGNOSIS — N400 Enlarged prostate without lower urinary tract symptoms: Secondary | ICD-10-CM | POA: Diagnosis not present

## 2015-10-18 DIAGNOSIS — E1122 Type 2 diabetes mellitus with diabetic chronic kidney disease: Secondary | ICD-10-CM | POA: Diagnosis not present

## 2015-10-18 DIAGNOSIS — N183 Chronic kidney disease, stage 3 (moderate): Secondary | ICD-10-CM | POA: Diagnosis not present

## 2015-10-18 DIAGNOSIS — Z7984 Long term (current) use of oral hypoglycemic drugs: Secondary | ICD-10-CM | POA: Diagnosis not present

## 2015-10-18 DIAGNOSIS — I10 Essential (primary) hypertension: Secondary | ICD-10-CM | POA: Diagnosis not present

## 2015-10-18 DIAGNOSIS — E1142 Type 2 diabetes mellitus with diabetic polyneuropathy: Secondary | ICD-10-CM | POA: Diagnosis not present

## 2015-10-18 DIAGNOSIS — M109 Gout, unspecified: Secondary | ICD-10-CM | POA: Diagnosis not present

## 2015-10-18 DIAGNOSIS — R600 Localized edema: Secondary | ICD-10-CM | POA: Diagnosis not present

## 2015-10-19 DIAGNOSIS — E1122 Type 2 diabetes mellitus with diabetic chronic kidney disease: Secondary | ICD-10-CM | POA: Diagnosis not present

## 2015-10-19 DIAGNOSIS — N400 Enlarged prostate without lower urinary tract symptoms: Secondary | ICD-10-CM | POA: Diagnosis not present

## 2015-10-19 DIAGNOSIS — L409 Psoriasis, unspecified: Secondary | ICD-10-CM | POA: Diagnosis not present

## 2015-10-19 DIAGNOSIS — H548 Legal blindness, as defined in USA: Secondary | ICD-10-CM | POA: Diagnosis not present

## 2015-10-19 DIAGNOSIS — M109 Gout, unspecified: Secondary | ICD-10-CM | POA: Diagnosis not present

## 2015-10-19 DIAGNOSIS — N183 Chronic kidney disease, stage 3 (moderate): Secondary | ICD-10-CM | POA: Diagnosis not present

## 2015-10-19 DIAGNOSIS — G894 Chronic pain syndrome: Secondary | ICD-10-CM | POA: Diagnosis not present

## 2015-10-19 DIAGNOSIS — E785 Hyperlipidemia, unspecified: Secondary | ICD-10-CM | POA: Diagnosis not present

## 2015-10-19 DIAGNOSIS — I129 Hypertensive chronic kidney disease with stage 1 through stage 4 chronic kidney disease, or unspecified chronic kidney disease: Secondary | ICD-10-CM | POA: Diagnosis not present

## 2015-10-20 DIAGNOSIS — E785 Hyperlipidemia, unspecified: Secondary | ICD-10-CM | POA: Diagnosis not present

## 2015-10-20 DIAGNOSIS — M109 Gout, unspecified: Secondary | ICD-10-CM | POA: Diagnosis not present

## 2015-10-20 DIAGNOSIS — N183 Chronic kidney disease, stage 3 (moderate): Secondary | ICD-10-CM | POA: Diagnosis not present

## 2015-10-20 DIAGNOSIS — N400 Enlarged prostate without lower urinary tract symptoms: Secondary | ICD-10-CM | POA: Diagnosis not present

## 2015-10-20 DIAGNOSIS — H548 Legal blindness, as defined in USA: Secondary | ICD-10-CM | POA: Diagnosis not present

## 2015-10-20 DIAGNOSIS — L409 Psoriasis, unspecified: Secondary | ICD-10-CM | POA: Diagnosis not present

## 2015-10-20 DIAGNOSIS — I129 Hypertensive chronic kidney disease with stage 1 through stage 4 chronic kidney disease, or unspecified chronic kidney disease: Secondary | ICD-10-CM | POA: Diagnosis not present

## 2015-10-20 DIAGNOSIS — E1122 Type 2 diabetes mellitus with diabetic chronic kidney disease: Secondary | ICD-10-CM | POA: Diagnosis not present

## 2015-10-20 DIAGNOSIS — G894 Chronic pain syndrome: Secondary | ICD-10-CM | POA: Diagnosis not present

## 2015-10-21 DIAGNOSIS — I129 Hypertensive chronic kidney disease with stage 1 through stage 4 chronic kidney disease, or unspecified chronic kidney disease: Secondary | ICD-10-CM | POA: Diagnosis not present

## 2015-10-21 DIAGNOSIS — E785 Hyperlipidemia, unspecified: Secondary | ICD-10-CM | POA: Diagnosis not present

## 2015-10-21 DIAGNOSIS — H548 Legal blindness, as defined in USA: Secondary | ICD-10-CM | POA: Diagnosis not present

## 2015-10-21 DIAGNOSIS — E1122 Type 2 diabetes mellitus with diabetic chronic kidney disease: Secondary | ICD-10-CM | POA: Diagnosis not present

## 2015-10-21 DIAGNOSIS — M109 Gout, unspecified: Secondary | ICD-10-CM | POA: Diagnosis not present

## 2015-10-21 DIAGNOSIS — G894 Chronic pain syndrome: Secondary | ICD-10-CM | POA: Diagnosis not present

## 2015-10-21 DIAGNOSIS — N183 Chronic kidney disease, stage 3 (moderate): Secondary | ICD-10-CM | POA: Diagnosis not present

## 2015-10-21 DIAGNOSIS — N400 Enlarged prostate without lower urinary tract symptoms: Secondary | ICD-10-CM | POA: Diagnosis not present

## 2015-10-21 DIAGNOSIS — L409 Psoriasis, unspecified: Secondary | ICD-10-CM | POA: Diagnosis not present

## 2015-10-25 DIAGNOSIS — L409 Psoriasis, unspecified: Secondary | ICD-10-CM | POA: Diagnosis not present

## 2015-10-25 DIAGNOSIS — N400 Enlarged prostate without lower urinary tract symptoms: Secondary | ICD-10-CM | POA: Diagnosis not present

## 2015-10-25 DIAGNOSIS — G894 Chronic pain syndrome: Secondary | ICD-10-CM | POA: Diagnosis not present

## 2015-10-25 DIAGNOSIS — N183 Chronic kidney disease, stage 3 (moderate): Secondary | ICD-10-CM | POA: Diagnosis not present

## 2015-10-25 DIAGNOSIS — M109 Gout, unspecified: Secondary | ICD-10-CM | POA: Diagnosis not present

## 2015-10-25 DIAGNOSIS — I129 Hypertensive chronic kidney disease with stage 1 through stage 4 chronic kidney disease, or unspecified chronic kidney disease: Secondary | ICD-10-CM | POA: Diagnosis not present

## 2015-10-25 DIAGNOSIS — H548 Legal blindness, as defined in USA: Secondary | ICD-10-CM | POA: Diagnosis not present

## 2015-10-25 DIAGNOSIS — E1122 Type 2 diabetes mellitus with diabetic chronic kidney disease: Secondary | ICD-10-CM | POA: Diagnosis not present

## 2015-10-25 DIAGNOSIS — E785 Hyperlipidemia, unspecified: Secondary | ICD-10-CM | POA: Diagnosis not present

## 2015-10-27 DIAGNOSIS — E1122 Type 2 diabetes mellitus with diabetic chronic kidney disease: Secondary | ICD-10-CM | POA: Diagnosis not present

## 2015-10-27 DIAGNOSIS — H548 Legal blindness, as defined in USA: Secondary | ICD-10-CM | POA: Diagnosis not present

## 2015-10-27 DIAGNOSIS — E785 Hyperlipidemia, unspecified: Secondary | ICD-10-CM | POA: Diagnosis not present

## 2015-10-27 DIAGNOSIS — N281 Cyst of kidney, acquired: Secondary | ICD-10-CM | POA: Diagnosis not present

## 2015-10-27 DIAGNOSIS — M109 Gout, unspecified: Secondary | ICD-10-CM | POA: Diagnosis not present

## 2015-10-27 DIAGNOSIS — N400 Enlarged prostate without lower urinary tract symptoms: Secondary | ICD-10-CM | POA: Diagnosis not present

## 2015-10-27 DIAGNOSIS — L409 Psoriasis, unspecified: Secondary | ICD-10-CM | POA: Diagnosis not present

## 2015-10-27 DIAGNOSIS — N183 Chronic kidney disease, stage 3 (moderate): Secondary | ICD-10-CM | POA: Diagnosis not present

## 2015-10-27 DIAGNOSIS — I129 Hypertensive chronic kidney disease with stage 1 through stage 4 chronic kidney disease, or unspecified chronic kidney disease: Secondary | ICD-10-CM | POA: Diagnosis not present

## 2015-10-27 DIAGNOSIS — N189 Chronic kidney disease, unspecified: Secondary | ICD-10-CM | POA: Diagnosis not present

## 2015-10-27 DIAGNOSIS — G894 Chronic pain syndrome: Secondary | ICD-10-CM | POA: Diagnosis not present

## 2015-10-28 DIAGNOSIS — G894 Chronic pain syndrome: Secondary | ICD-10-CM | POA: Diagnosis not present

## 2015-10-28 DIAGNOSIS — N400 Enlarged prostate without lower urinary tract symptoms: Secondary | ICD-10-CM | POA: Diagnosis not present

## 2015-10-28 DIAGNOSIS — H548 Legal blindness, as defined in USA: Secondary | ICD-10-CM | POA: Diagnosis not present

## 2015-10-28 DIAGNOSIS — L409 Psoriasis, unspecified: Secondary | ICD-10-CM | POA: Diagnosis not present

## 2015-10-28 DIAGNOSIS — N183 Chronic kidney disease, stage 3 (moderate): Secondary | ICD-10-CM | POA: Diagnosis not present

## 2015-10-28 DIAGNOSIS — E785 Hyperlipidemia, unspecified: Secondary | ICD-10-CM | POA: Diagnosis not present

## 2015-10-28 DIAGNOSIS — I129 Hypertensive chronic kidney disease with stage 1 through stage 4 chronic kidney disease, or unspecified chronic kidney disease: Secondary | ICD-10-CM | POA: Diagnosis not present

## 2015-10-28 DIAGNOSIS — M109 Gout, unspecified: Secondary | ICD-10-CM | POA: Diagnosis not present

## 2015-10-28 DIAGNOSIS — E1122 Type 2 diabetes mellitus with diabetic chronic kidney disease: Secondary | ICD-10-CM | POA: Diagnosis not present

## 2015-11-01 DIAGNOSIS — G894 Chronic pain syndrome: Secondary | ICD-10-CM | POA: Diagnosis not present

## 2015-11-01 DIAGNOSIS — N400 Enlarged prostate without lower urinary tract symptoms: Secondary | ICD-10-CM | POA: Diagnosis not present

## 2015-11-01 DIAGNOSIS — M109 Gout, unspecified: Secondary | ICD-10-CM | POA: Diagnosis not present

## 2015-11-01 DIAGNOSIS — L409 Psoriasis, unspecified: Secondary | ICD-10-CM | POA: Diagnosis not present

## 2015-11-01 DIAGNOSIS — E1122 Type 2 diabetes mellitus with diabetic chronic kidney disease: Secondary | ICD-10-CM | POA: Diagnosis not present

## 2015-11-01 DIAGNOSIS — N183 Chronic kidney disease, stage 3 (moderate): Secondary | ICD-10-CM | POA: Diagnosis not present

## 2015-11-01 DIAGNOSIS — I129 Hypertensive chronic kidney disease with stage 1 through stage 4 chronic kidney disease, or unspecified chronic kidney disease: Secondary | ICD-10-CM | POA: Diagnosis not present

## 2015-11-01 DIAGNOSIS — E785 Hyperlipidemia, unspecified: Secondary | ICD-10-CM | POA: Diagnosis not present

## 2015-11-01 DIAGNOSIS — H548 Legal blindness, as defined in USA: Secondary | ICD-10-CM | POA: Diagnosis not present

## 2015-11-02 DIAGNOSIS — N183 Chronic kidney disease, stage 3 (moderate): Secondary | ICD-10-CM | POA: Diagnosis not present

## 2015-11-02 DIAGNOSIS — E785 Hyperlipidemia, unspecified: Secondary | ICD-10-CM | POA: Diagnosis not present

## 2015-11-02 DIAGNOSIS — I129 Hypertensive chronic kidney disease with stage 1 through stage 4 chronic kidney disease, or unspecified chronic kidney disease: Secondary | ICD-10-CM | POA: Diagnosis not present

## 2015-11-02 DIAGNOSIS — N400 Enlarged prostate without lower urinary tract symptoms: Secondary | ICD-10-CM | POA: Diagnosis not present

## 2015-11-02 DIAGNOSIS — L409 Psoriasis, unspecified: Secondary | ICD-10-CM | POA: Diagnosis not present

## 2015-11-02 DIAGNOSIS — G894 Chronic pain syndrome: Secondary | ICD-10-CM | POA: Diagnosis not present

## 2015-11-02 DIAGNOSIS — M109 Gout, unspecified: Secondary | ICD-10-CM | POA: Diagnosis not present

## 2015-11-02 DIAGNOSIS — E1122 Type 2 diabetes mellitus with diabetic chronic kidney disease: Secondary | ICD-10-CM | POA: Diagnosis not present

## 2015-11-02 DIAGNOSIS — H548 Legal blindness, as defined in USA: Secondary | ICD-10-CM | POA: Diagnosis not present

## 2015-11-03 DIAGNOSIS — H548 Legal blindness, as defined in USA: Secondary | ICD-10-CM | POA: Diagnosis not present

## 2015-11-03 DIAGNOSIS — N183 Chronic kidney disease, stage 3 (moderate): Secondary | ICD-10-CM | POA: Diagnosis not present

## 2015-11-03 DIAGNOSIS — N400 Enlarged prostate without lower urinary tract symptoms: Secondary | ICD-10-CM | POA: Diagnosis not present

## 2015-11-03 DIAGNOSIS — I129 Hypertensive chronic kidney disease with stage 1 through stage 4 chronic kidney disease, or unspecified chronic kidney disease: Secondary | ICD-10-CM | POA: Diagnosis not present

## 2015-11-03 DIAGNOSIS — M109 Gout, unspecified: Secondary | ICD-10-CM | POA: Diagnosis not present

## 2015-11-03 DIAGNOSIS — L409 Psoriasis, unspecified: Secondary | ICD-10-CM | POA: Diagnosis not present

## 2015-11-03 DIAGNOSIS — E1122 Type 2 diabetes mellitus with diabetic chronic kidney disease: Secondary | ICD-10-CM | POA: Diagnosis not present

## 2015-11-03 DIAGNOSIS — E785 Hyperlipidemia, unspecified: Secondary | ICD-10-CM | POA: Diagnosis not present

## 2015-11-03 DIAGNOSIS — G894 Chronic pain syndrome: Secondary | ICD-10-CM | POA: Diagnosis not present

## 2015-11-04 DIAGNOSIS — H548 Legal blindness, as defined in USA: Secondary | ICD-10-CM | POA: Diagnosis not present

## 2015-11-04 DIAGNOSIS — G479 Sleep disorder, unspecified: Secondary | ICD-10-CM | POA: Diagnosis not present

## 2015-11-04 DIAGNOSIS — I129 Hypertensive chronic kidney disease with stage 1 through stage 4 chronic kidney disease, or unspecified chronic kidney disease: Secondary | ICD-10-CM | POA: Diagnosis not present

## 2015-11-04 DIAGNOSIS — R0789 Other chest pain: Secondary | ICD-10-CM | POA: Diagnosis not present

## 2015-11-04 DIAGNOSIS — M109 Gout, unspecified: Secondary | ICD-10-CM | POA: Diagnosis not present

## 2015-11-04 DIAGNOSIS — N400 Enlarged prostate without lower urinary tract symptoms: Secondary | ICD-10-CM | POA: Diagnosis not present

## 2015-11-04 DIAGNOSIS — E1122 Type 2 diabetes mellitus with diabetic chronic kidney disease: Secondary | ICD-10-CM | POA: Diagnosis not present

## 2015-11-04 DIAGNOSIS — N183 Chronic kidney disease, stage 3 (moderate): Secondary | ICD-10-CM | POA: Diagnosis not present

## 2015-11-04 DIAGNOSIS — G894 Chronic pain syndrome: Secondary | ICD-10-CM | POA: Diagnosis not present

## 2015-11-04 DIAGNOSIS — E785 Hyperlipidemia, unspecified: Secondary | ICD-10-CM | POA: Diagnosis not present

## 2015-11-04 DIAGNOSIS — L409 Psoriasis, unspecified: Secondary | ICD-10-CM | POA: Diagnosis not present

## 2015-11-07 DIAGNOSIS — N400 Enlarged prostate without lower urinary tract symptoms: Secondary | ICD-10-CM | POA: Diagnosis not present

## 2015-11-07 DIAGNOSIS — L409 Psoriasis, unspecified: Secondary | ICD-10-CM | POA: Diagnosis not present

## 2015-11-07 DIAGNOSIS — H548 Legal blindness, as defined in USA: Secondary | ICD-10-CM | POA: Diagnosis not present

## 2015-11-07 DIAGNOSIS — E1122 Type 2 diabetes mellitus with diabetic chronic kidney disease: Secondary | ICD-10-CM | POA: Diagnosis not present

## 2015-11-07 DIAGNOSIS — I129 Hypertensive chronic kidney disease with stage 1 through stage 4 chronic kidney disease, or unspecified chronic kidney disease: Secondary | ICD-10-CM | POA: Diagnosis not present

## 2015-11-07 DIAGNOSIS — G894 Chronic pain syndrome: Secondary | ICD-10-CM | POA: Diagnosis not present

## 2015-11-07 DIAGNOSIS — M109 Gout, unspecified: Secondary | ICD-10-CM | POA: Diagnosis not present

## 2015-11-07 DIAGNOSIS — N183 Chronic kidney disease, stage 3 (moderate): Secondary | ICD-10-CM | POA: Diagnosis not present

## 2015-11-07 DIAGNOSIS — E785 Hyperlipidemia, unspecified: Secondary | ICD-10-CM | POA: Diagnosis not present

## 2015-11-09 DIAGNOSIS — N183 Chronic kidney disease, stage 3 (moderate): Secondary | ICD-10-CM | POA: Diagnosis not present

## 2015-11-09 DIAGNOSIS — I129 Hypertensive chronic kidney disease with stage 1 through stage 4 chronic kidney disease, or unspecified chronic kidney disease: Secondary | ICD-10-CM | POA: Diagnosis not present

## 2015-11-09 DIAGNOSIS — E785 Hyperlipidemia, unspecified: Secondary | ICD-10-CM | POA: Diagnosis not present

## 2015-11-09 DIAGNOSIS — H548 Legal blindness, as defined in USA: Secondary | ICD-10-CM | POA: Diagnosis not present

## 2015-11-09 DIAGNOSIS — G894 Chronic pain syndrome: Secondary | ICD-10-CM | POA: Diagnosis not present

## 2015-11-09 DIAGNOSIS — E1122 Type 2 diabetes mellitus with diabetic chronic kidney disease: Secondary | ICD-10-CM | POA: Diagnosis not present

## 2015-11-09 DIAGNOSIS — M109 Gout, unspecified: Secondary | ICD-10-CM | POA: Diagnosis not present

## 2015-11-09 DIAGNOSIS — N400 Enlarged prostate without lower urinary tract symptoms: Secondary | ICD-10-CM | POA: Diagnosis not present

## 2015-11-09 DIAGNOSIS — L409 Psoriasis, unspecified: Secondary | ICD-10-CM | POA: Diagnosis not present

## 2015-11-14 DIAGNOSIS — N400 Enlarged prostate without lower urinary tract symptoms: Secondary | ICD-10-CM | POA: Diagnosis not present

## 2015-11-14 DIAGNOSIS — E1122 Type 2 diabetes mellitus with diabetic chronic kidney disease: Secondary | ICD-10-CM | POA: Diagnosis not present

## 2015-11-14 DIAGNOSIS — E785 Hyperlipidemia, unspecified: Secondary | ICD-10-CM | POA: Diagnosis not present

## 2015-11-14 DIAGNOSIS — L409 Psoriasis, unspecified: Secondary | ICD-10-CM | POA: Diagnosis not present

## 2015-11-14 DIAGNOSIS — I129 Hypertensive chronic kidney disease with stage 1 through stage 4 chronic kidney disease, or unspecified chronic kidney disease: Secondary | ICD-10-CM | POA: Diagnosis not present

## 2015-11-14 DIAGNOSIS — M109 Gout, unspecified: Secondary | ICD-10-CM | POA: Diagnosis not present

## 2015-11-14 DIAGNOSIS — H548 Legal blindness, as defined in USA: Secondary | ICD-10-CM | POA: Diagnosis not present

## 2015-11-14 DIAGNOSIS — N183 Chronic kidney disease, stage 3 (moderate): Secondary | ICD-10-CM | POA: Diagnosis not present

## 2015-11-14 DIAGNOSIS — G894 Chronic pain syndrome: Secondary | ICD-10-CM | POA: Diagnosis not present

## 2015-11-15 DIAGNOSIS — E1122 Type 2 diabetes mellitus with diabetic chronic kidney disease: Secondary | ICD-10-CM | POA: Diagnosis not present

## 2015-11-15 DIAGNOSIS — G894 Chronic pain syndrome: Secondary | ICD-10-CM | POA: Diagnosis not present

## 2015-11-15 DIAGNOSIS — N400 Enlarged prostate without lower urinary tract symptoms: Secondary | ICD-10-CM | POA: Diagnosis not present

## 2015-11-15 DIAGNOSIS — L409 Psoriasis, unspecified: Secondary | ICD-10-CM | POA: Diagnosis not present

## 2015-11-15 DIAGNOSIS — N183 Chronic kidney disease, stage 3 (moderate): Secondary | ICD-10-CM | POA: Diagnosis not present

## 2015-11-15 DIAGNOSIS — Z7984 Long term (current) use of oral hypoglycemic drugs: Secondary | ICD-10-CM | POA: Diagnosis not present

## 2015-11-15 DIAGNOSIS — H548 Legal blindness, as defined in USA: Secondary | ICD-10-CM | POA: Diagnosis not present

## 2015-11-15 DIAGNOSIS — I129 Hypertensive chronic kidney disease with stage 1 through stage 4 chronic kidney disease, or unspecified chronic kidney disease: Secondary | ICD-10-CM | POA: Diagnosis not present

## 2015-11-15 DIAGNOSIS — E785 Hyperlipidemia, unspecified: Secondary | ICD-10-CM | POA: Diagnosis not present

## 2015-11-15 DIAGNOSIS — M109 Gout, unspecified: Secondary | ICD-10-CM | POA: Diagnosis not present

## 2015-11-15 DIAGNOSIS — R600 Localized edema: Secondary | ICD-10-CM | POA: Diagnosis not present

## 2015-11-16 DIAGNOSIS — L409 Psoriasis, unspecified: Secondary | ICD-10-CM | POA: Diagnosis not present

## 2015-11-16 DIAGNOSIS — M109 Gout, unspecified: Secondary | ICD-10-CM | POA: Diagnosis not present

## 2015-11-16 DIAGNOSIS — G894 Chronic pain syndrome: Secondary | ICD-10-CM | POA: Diagnosis not present

## 2015-11-16 DIAGNOSIS — N183 Chronic kidney disease, stage 3 (moderate): Secondary | ICD-10-CM | POA: Diagnosis not present

## 2015-11-16 DIAGNOSIS — E785 Hyperlipidemia, unspecified: Secondary | ICD-10-CM | POA: Diagnosis not present

## 2015-11-16 DIAGNOSIS — I129 Hypertensive chronic kidney disease with stage 1 through stage 4 chronic kidney disease, or unspecified chronic kidney disease: Secondary | ICD-10-CM | POA: Diagnosis not present

## 2015-11-16 DIAGNOSIS — N400 Enlarged prostate without lower urinary tract symptoms: Secondary | ICD-10-CM | POA: Diagnosis not present

## 2015-11-16 DIAGNOSIS — E1122 Type 2 diabetes mellitus with diabetic chronic kidney disease: Secondary | ICD-10-CM | POA: Diagnosis not present

## 2015-11-16 DIAGNOSIS — H548 Legal blindness, as defined in USA: Secondary | ICD-10-CM | POA: Diagnosis not present

## 2015-11-17 DIAGNOSIS — G894 Chronic pain syndrome: Secondary | ICD-10-CM | POA: Diagnosis not present

## 2015-11-17 DIAGNOSIS — H548 Legal blindness, as defined in USA: Secondary | ICD-10-CM | POA: Diagnosis not present

## 2015-11-17 DIAGNOSIS — I129 Hypertensive chronic kidney disease with stage 1 through stage 4 chronic kidney disease, or unspecified chronic kidney disease: Secondary | ICD-10-CM | POA: Diagnosis not present

## 2015-11-17 DIAGNOSIS — M109 Gout, unspecified: Secondary | ICD-10-CM | POA: Diagnosis not present

## 2015-11-17 DIAGNOSIS — L409 Psoriasis, unspecified: Secondary | ICD-10-CM | POA: Diagnosis not present

## 2015-11-17 DIAGNOSIS — N183 Chronic kidney disease, stage 3 (moderate): Secondary | ICD-10-CM | POA: Diagnosis not present

## 2015-11-17 DIAGNOSIS — N400 Enlarged prostate without lower urinary tract symptoms: Secondary | ICD-10-CM | POA: Diagnosis not present

## 2015-11-17 DIAGNOSIS — E785 Hyperlipidemia, unspecified: Secondary | ICD-10-CM | POA: Diagnosis not present

## 2015-11-17 DIAGNOSIS — E1122 Type 2 diabetes mellitus with diabetic chronic kidney disease: Secondary | ICD-10-CM | POA: Diagnosis not present

## 2015-11-22 DIAGNOSIS — M109 Gout, unspecified: Secondary | ICD-10-CM | POA: Diagnosis not present

## 2015-11-22 DIAGNOSIS — N183 Chronic kidney disease, stage 3 (moderate): Secondary | ICD-10-CM | POA: Diagnosis not present

## 2015-11-22 DIAGNOSIS — L409 Psoriasis, unspecified: Secondary | ICD-10-CM | POA: Diagnosis not present

## 2015-11-22 DIAGNOSIS — G894 Chronic pain syndrome: Secondary | ICD-10-CM | POA: Diagnosis not present

## 2015-11-22 DIAGNOSIS — E1122 Type 2 diabetes mellitus with diabetic chronic kidney disease: Secondary | ICD-10-CM | POA: Diagnosis not present

## 2015-11-22 DIAGNOSIS — I129 Hypertensive chronic kidney disease with stage 1 through stage 4 chronic kidney disease, or unspecified chronic kidney disease: Secondary | ICD-10-CM | POA: Diagnosis not present

## 2015-11-22 DIAGNOSIS — E785 Hyperlipidemia, unspecified: Secondary | ICD-10-CM | POA: Diagnosis not present

## 2015-11-22 DIAGNOSIS — N400 Enlarged prostate without lower urinary tract symptoms: Secondary | ICD-10-CM | POA: Diagnosis not present

## 2015-11-22 DIAGNOSIS — H548 Legal blindness, as defined in USA: Secondary | ICD-10-CM | POA: Diagnosis not present

## 2015-11-24 DIAGNOSIS — N183 Chronic kidney disease, stage 3 (moderate): Secondary | ICD-10-CM | POA: Diagnosis not present

## 2015-11-24 DIAGNOSIS — E1122 Type 2 diabetes mellitus with diabetic chronic kidney disease: Secondary | ICD-10-CM | POA: Diagnosis not present

## 2015-11-24 DIAGNOSIS — N400 Enlarged prostate without lower urinary tract symptoms: Secondary | ICD-10-CM | POA: Diagnosis not present

## 2015-11-24 DIAGNOSIS — I129 Hypertensive chronic kidney disease with stage 1 through stage 4 chronic kidney disease, or unspecified chronic kidney disease: Secondary | ICD-10-CM | POA: Diagnosis not present

## 2015-11-24 DIAGNOSIS — M109 Gout, unspecified: Secondary | ICD-10-CM | POA: Diagnosis not present

## 2015-11-24 DIAGNOSIS — G894 Chronic pain syndrome: Secondary | ICD-10-CM | POA: Diagnosis not present

## 2015-11-24 DIAGNOSIS — L409 Psoriasis, unspecified: Secondary | ICD-10-CM | POA: Diagnosis not present

## 2015-11-24 DIAGNOSIS — E785 Hyperlipidemia, unspecified: Secondary | ICD-10-CM | POA: Diagnosis not present

## 2015-11-24 DIAGNOSIS — H548 Legal blindness, as defined in USA: Secondary | ICD-10-CM | POA: Diagnosis not present

## 2015-11-30 DIAGNOSIS — M7022 Olecranon bursitis, left elbow: Secondary | ICD-10-CM | POA: Diagnosis not present

## 2015-12-06 DIAGNOSIS — H548 Legal blindness, as defined in USA: Secondary | ICD-10-CM | POA: Diagnosis not present

## 2015-12-06 DIAGNOSIS — M109 Gout, unspecified: Secondary | ICD-10-CM | POA: Diagnosis not present

## 2015-12-06 DIAGNOSIS — E785 Hyperlipidemia, unspecified: Secondary | ICD-10-CM | POA: Diagnosis not present

## 2015-12-06 DIAGNOSIS — G894 Chronic pain syndrome: Secondary | ICD-10-CM | POA: Diagnosis not present

## 2015-12-06 DIAGNOSIS — L409 Psoriasis, unspecified: Secondary | ICD-10-CM | POA: Diagnosis not present

## 2015-12-06 DIAGNOSIS — I129 Hypertensive chronic kidney disease with stage 1 through stage 4 chronic kidney disease, or unspecified chronic kidney disease: Secondary | ICD-10-CM | POA: Diagnosis not present

## 2015-12-06 DIAGNOSIS — N400 Enlarged prostate without lower urinary tract symptoms: Secondary | ICD-10-CM | POA: Diagnosis not present

## 2015-12-06 DIAGNOSIS — E1122 Type 2 diabetes mellitus with diabetic chronic kidney disease: Secondary | ICD-10-CM | POA: Diagnosis not present

## 2015-12-06 DIAGNOSIS — N183 Chronic kidney disease, stage 3 (moderate): Secondary | ICD-10-CM | POA: Diagnosis not present

## 2015-12-27 DIAGNOSIS — G4733 Obstructive sleep apnea (adult) (pediatric): Secondary | ICD-10-CM | POA: Diagnosis not present

## 2015-12-27 DIAGNOSIS — G479 Sleep disorder, unspecified: Secondary | ICD-10-CM | POA: Diagnosis not present

## 2016-01-30 DIAGNOSIS — N183 Chronic kidney disease, stage 3 (moderate): Secondary | ICD-10-CM | POA: Diagnosis not present

## 2016-01-30 DIAGNOSIS — I129 Hypertensive chronic kidney disease with stage 1 through stage 4 chronic kidney disease, or unspecified chronic kidney disease: Secondary | ICD-10-CM | POA: Diagnosis not present

## 2016-01-30 DIAGNOSIS — E1122 Type 2 diabetes mellitus with diabetic chronic kidney disease: Secondary | ICD-10-CM | POA: Diagnosis not present

## 2016-01-30 DIAGNOSIS — R600 Localized edema: Secondary | ICD-10-CM | POA: Diagnosis not present

## 2016-02-03 DIAGNOSIS — M5136 Other intervertebral disc degeneration, lumbar region: Secondary | ICD-10-CM | POA: Diagnosis not present

## 2016-02-03 DIAGNOSIS — M62830 Muscle spasm of back: Secondary | ICD-10-CM | POA: Diagnosis not present

## 2016-02-03 DIAGNOSIS — M545 Low back pain: Secondary | ICD-10-CM | POA: Diagnosis not present

## 2016-02-03 DIAGNOSIS — M6281 Muscle weakness (generalized): Secondary | ICD-10-CM | POA: Diagnosis not present

## 2016-02-06 DIAGNOSIS — G4733 Obstructive sleep apnea (adult) (pediatric): Secondary | ICD-10-CM | POA: Diagnosis not present

## 2016-02-07 DIAGNOSIS — G4733 Obstructive sleep apnea (adult) (pediatric): Secondary | ICD-10-CM | POA: Diagnosis not present

## 2016-02-15 DIAGNOSIS — Z7984 Long term (current) use of oral hypoglycemic drugs: Secondary | ICD-10-CM | POA: Diagnosis not present

## 2016-02-15 DIAGNOSIS — R300 Dysuria: Secondary | ICD-10-CM | POA: Diagnosis not present

## 2016-02-15 DIAGNOSIS — E1122 Type 2 diabetes mellitus with diabetic chronic kidney disease: Secondary | ICD-10-CM | POA: Diagnosis not present

## 2016-02-15 DIAGNOSIS — N183 Chronic kidney disease, stage 3 (moderate): Secondary | ICD-10-CM | POA: Diagnosis not present

## 2016-04-05 DIAGNOSIS — F322 Major depressive disorder, single episode, severe without psychotic features: Secondary | ICD-10-CM | POA: Diagnosis not present

## 2016-04-05 DIAGNOSIS — R350 Frequency of micturition: Secondary | ICD-10-CM | POA: Diagnosis not present

## 2016-04-05 DIAGNOSIS — Z23 Encounter for immunization: Secondary | ICD-10-CM | POA: Diagnosis not present

## 2016-05-01 DIAGNOSIS — E1142 Type 2 diabetes mellitus with diabetic polyneuropathy: Secondary | ICD-10-CM | POA: Diagnosis not present

## 2016-05-03 DIAGNOSIS — H348111 Central retinal vein occlusion, right eye, with retinal neovascularization: Secondary | ICD-10-CM | POA: Diagnosis not present

## 2016-05-03 DIAGNOSIS — H401133 Primary open-angle glaucoma, bilateral, severe stage: Secondary | ICD-10-CM | POA: Diagnosis not present

## 2016-05-03 DIAGNOSIS — H47233 Glaucomatous optic atrophy, bilateral: Secondary | ICD-10-CM | POA: Diagnosis not present

## 2016-05-22 DIAGNOSIS — I129 Hypertensive chronic kidney disease with stage 1 through stage 4 chronic kidney disease, or unspecified chronic kidney disease: Secondary | ICD-10-CM | POA: Diagnosis not present

## 2016-05-22 DIAGNOSIS — N390 Urinary tract infection, site not specified: Secondary | ICD-10-CM | POA: Diagnosis not present

## 2016-05-22 DIAGNOSIS — N183 Chronic kidney disease, stage 3 (moderate): Secondary | ICD-10-CM | POA: Diagnosis not present

## 2016-05-22 DIAGNOSIS — E1122 Type 2 diabetes mellitus with diabetic chronic kidney disease: Secondary | ICD-10-CM | POA: Diagnosis not present

## 2016-07-16 ENCOUNTER — Ambulatory Visit (INDEPENDENT_AMBULATORY_CARE_PROVIDER_SITE_OTHER): Payer: Medicare HMO | Admitting: Ophthalmology

## 2016-07-16 DIAGNOSIS — E113591 Type 2 diabetes mellitus with proliferative diabetic retinopathy without macular edema, right eye: Secondary | ICD-10-CM | POA: Diagnosis not present

## 2016-07-16 DIAGNOSIS — E11319 Type 2 diabetes mellitus with unspecified diabetic retinopathy without macular edema: Secondary | ICD-10-CM | POA: Diagnosis not present

## 2016-07-16 DIAGNOSIS — H43813 Vitreous degeneration, bilateral: Secondary | ICD-10-CM

## 2016-07-16 DIAGNOSIS — H348112 Central retinal vein occlusion, right eye, stable: Secondary | ICD-10-CM

## 2016-07-16 DIAGNOSIS — H353122 Nonexudative age-related macular degeneration, left eye, intermediate dry stage: Secondary | ICD-10-CM

## 2016-07-16 DIAGNOSIS — H35033 Hypertensive retinopathy, bilateral: Secondary | ICD-10-CM | POA: Diagnosis not present

## 2016-07-16 DIAGNOSIS — I10 Essential (primary) hypertension: Secondary | ICD-10-CM | POA: Diagnosis not present

## 2016-08-11 IMAGING — CT CT HEAD W/O CM
2 series · 15 of 30 positions shown, 19 images · non-contrast
Comparison: None.

CLINICAL DATA: Hypotension and loss of consciousness. 3 syncopal
episodes.

EXAM:
CT HEAD WITHOUT CONTRAST
TECHNIQUE: Contiguous axial images were obtained from the base of the skull
through the vertex without intravenous contrast.

[Series 201: head w/o, idose (1) · axial · non-contrast · 0.44mm/px · z∈[+85,+210]mm · 13 of 31 slices shown, 17 images]
[im 3/31  brain]
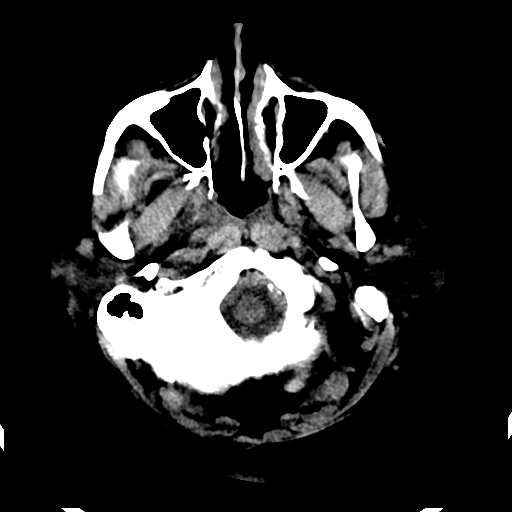
[im 3/31  bone]
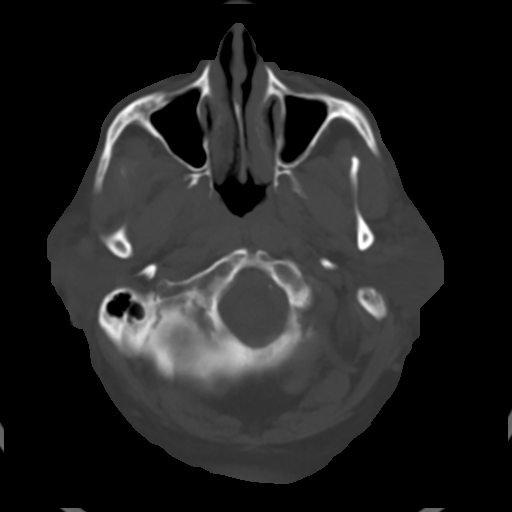
[im 5/31  brain]
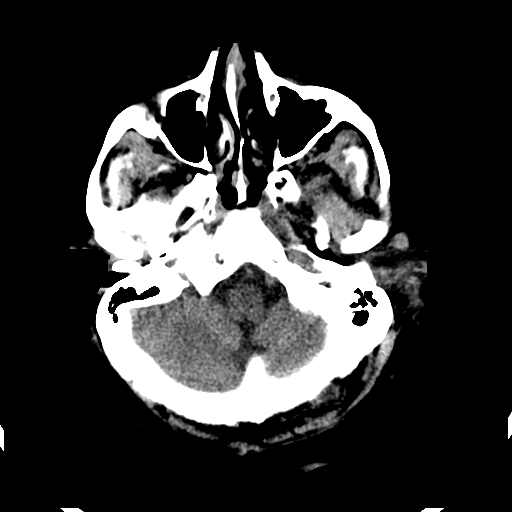
[im 7/31  brain]
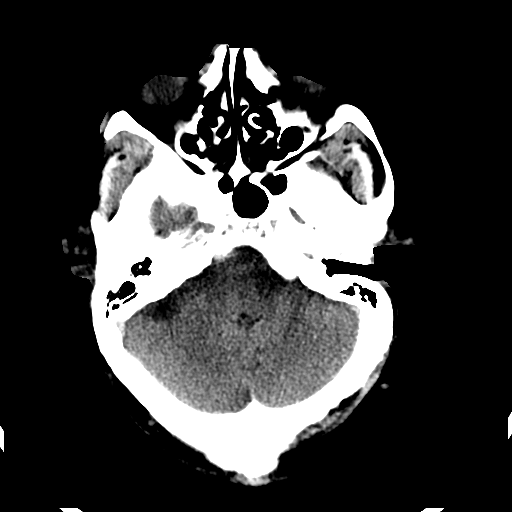
[im 9/31  brain]
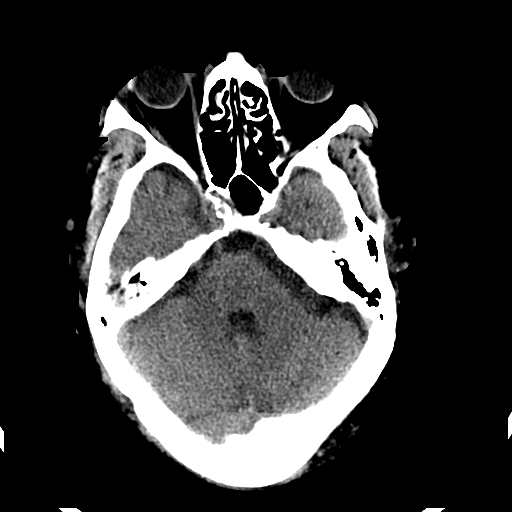
[im 11/31  brain]
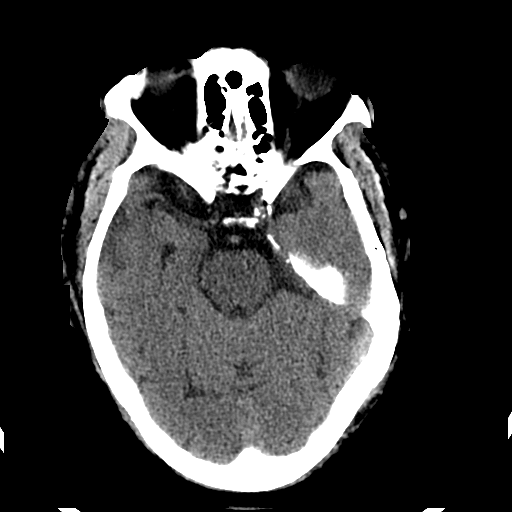
[im 11/31  bone]
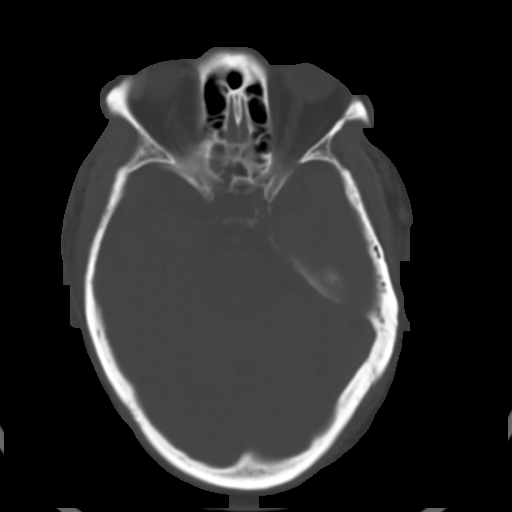
[im 13/31  brain]
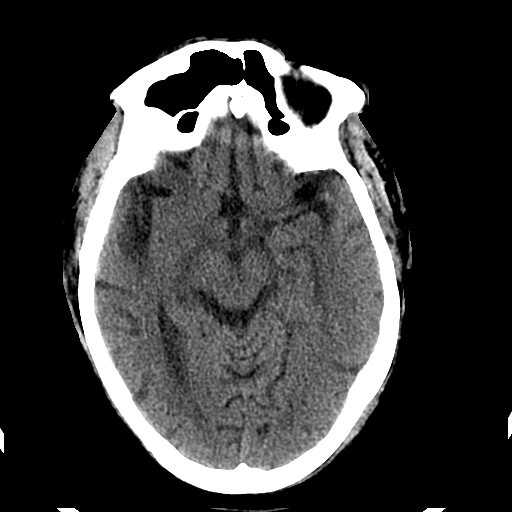
[im 16/31  brain]
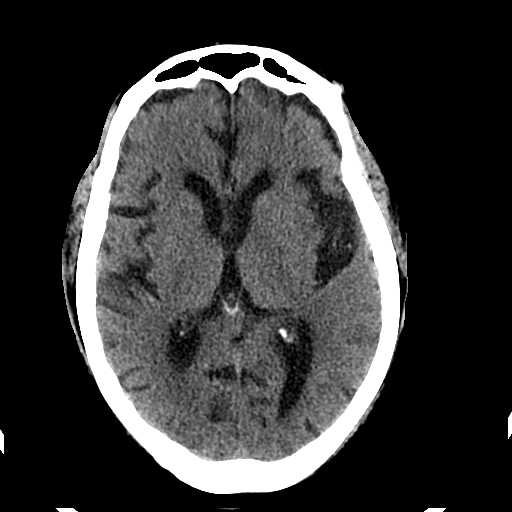
[im 18/31  brain]
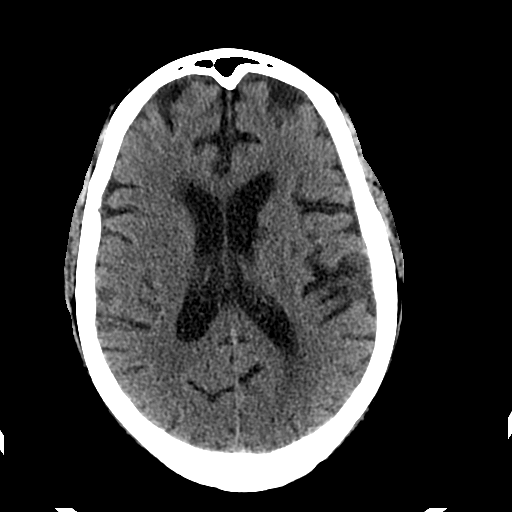
[im 20/31  brain]
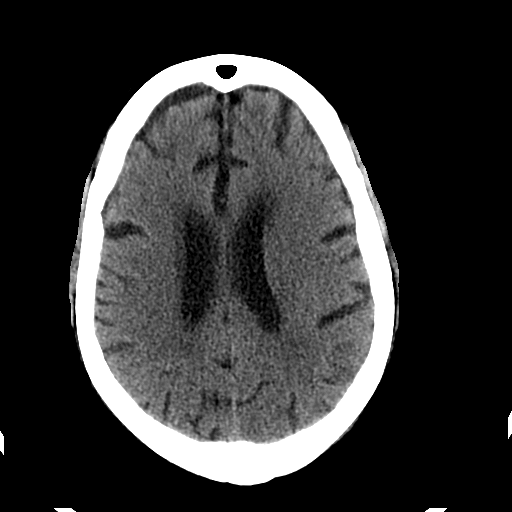
[im 20/31  bone]
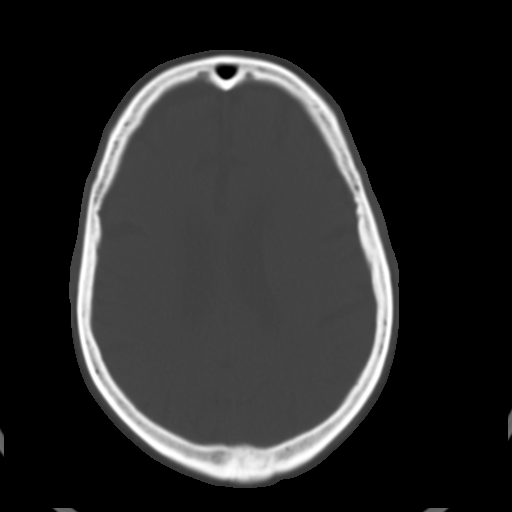
[im 22/31  brain]
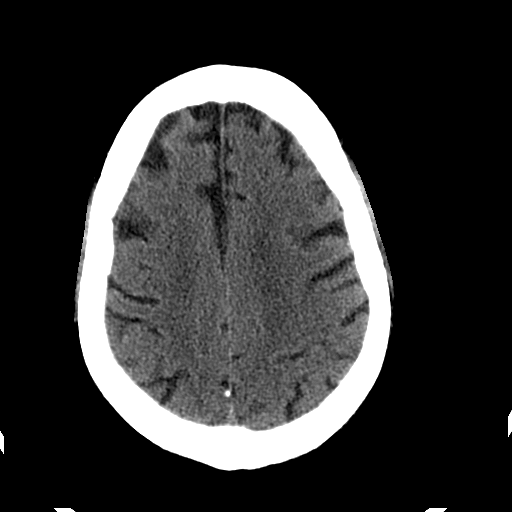
[im 24/31  brain]
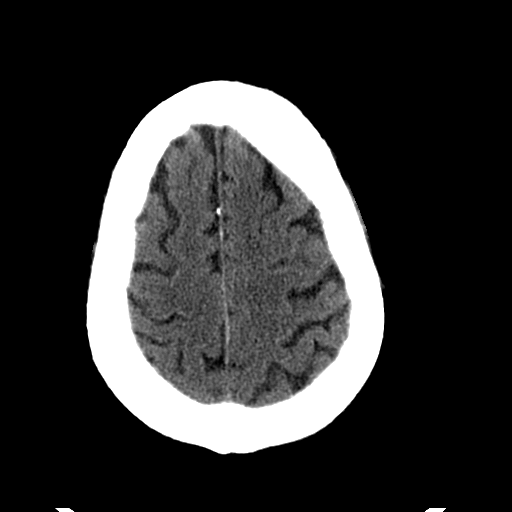
[im 26/31  brain]
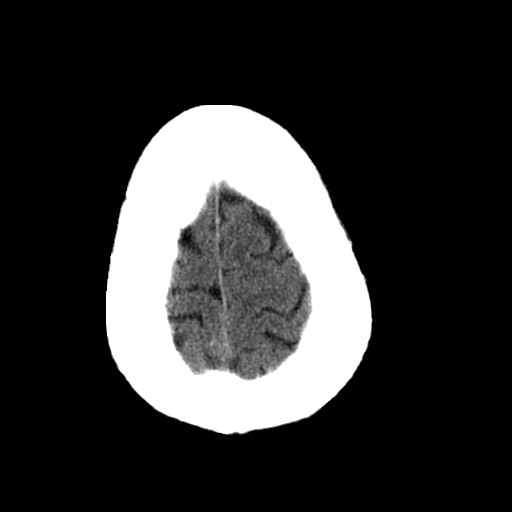
[im 28/31  brain]
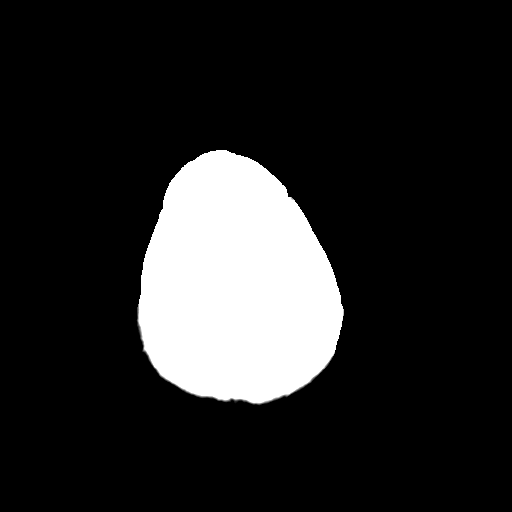
[im 28/31  bone]
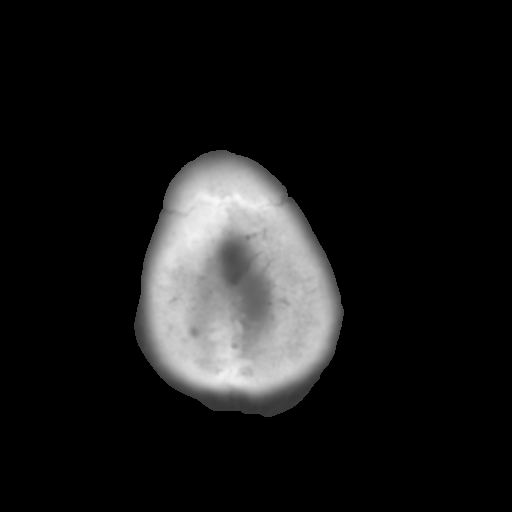

[Series 202: head w/o bone, idose (1) · axial · non-contrast · 0.44mm/px · z∈[+85,+105]mm · 2 of 31 slices shown]
[im 3/31  bone]
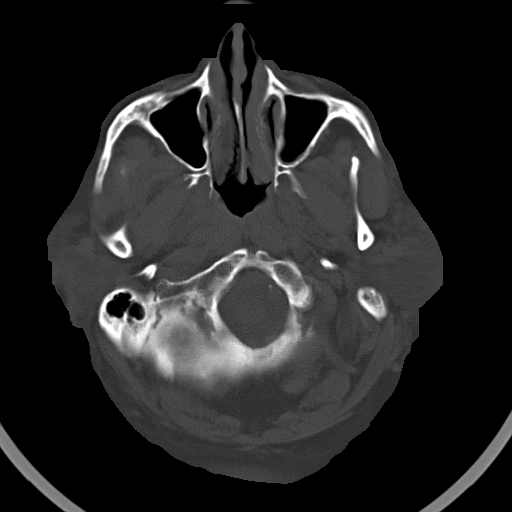
[im 7/31  bone]
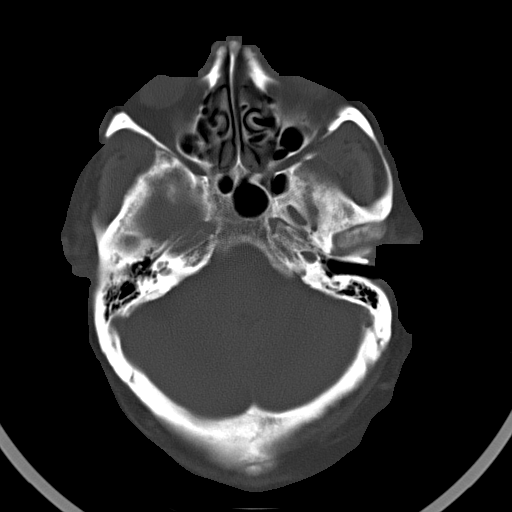

[15 of 30 positions shown; findings below may reference images not displayed]

FINDINGS: Diffuse cerebral atrophy. Mild ventricular dilatation consistent
with central atrophy. Patchy low-attenuation changes in the deep
white matter consistent small vessel ischemic changes. Multiple old
lacunar infarcts in the deep white matter particularly on the left.
No mass effect or midline shift. No abnormal extra-axial fluid
collections. Gray-white matter junctions are distinct. Basal
cisterns are not effaced. No evidence of acute intracranial
hemorrhage. No depressed skull fractures. Visualized paranasal
sinuses and mastoid air cells are not opacified. Vascular
calcifications.
IMPRESSION: No acute intracranial abnormalities. Chronic atrophy and small
vessel ischemic changes.

## 2016-09-24 DIAGNOSIS — N183 Chronic kidney disease, stage 3 (moderate): Secondary | ICD-10-CM | POA: Diagnosis not present

## 2016-09-24 DIAGNOSIS — I129 Hypertensive chronic kidney disease with stage 1 through stage 4 chronic kidney disease, or unspecified chronic kidney disease: Secondary | ICD-10-CM | POA: Diagnosis not present

## 2016-09-24 DIAGNOSIS — E1122 Type 2 diabetes mellitus with diabetic chronic kidney disease: Secondary | ICD-10-CM | POA: Diagnosis not present

## 2016-10-08 DIAGNOSIS — M25511 Pain in right shoulder: Secondary | ICD-10-CM | POA: Diagnosis not present

## 2016-10-08 DIAGNOSIS — L299 Pruritus, unspecified: Secondary | ICD-10-CM | POA: Diagnosis not present

## 2016-12-04 DIAGNOSIS — E1142 Type 2 diabetes mellitus with diabetic polyneuropathy: Secondary | ICD-10-CM | POA: Diagnosis not present

## 2017-02-13 DIAGNOSIS — H18231 Secondary corneal edema, right eye: Secondary | ICD-10-CM | POA: Diagnosis not present

## 2017-02-13 DIAGNOSIS — H47233 Glaucomatous optic atrophy, bilateral: Secondary | ICD-10-CM | POA: Diagnosis not present

## 2017-02-13 DIAGNOSIS — H401133 Primary open-angle glaucoma, bilateral, severe stage: Secondary | ICD-10-CM | POA: Diagnosis not present

## 2017-03-05 DIAGNOSIS — L603 Nail dystrophy: Secondary | ICD-10-CM | POA: Diagnosis not present

## 2017-03-05 DIAGNOSIS — E1151 Type 2 diabetes mellitus with diabetic peripheral angiopathy without gangrene: Secondary | ICD-10-CM | POA: Diagnosis not present

## 2017-03-05 DIAGNOSIS — I739 Peripheral vascular disease, unspecified: Secondary | ICD-10-CM | POA: Diagnosis not present

## 2017-03-11 DIAGNOSIS — H4010X0 Unspecified open-angle glaucoma, stage unspecified: Secondary | ICD-10-CM | POA: Diagnosis not present

## 2017-03-11 DIAGNOSIS — H401133 Primary open-angle glaucoma, bilateral, severe stage: Secondary | ICD-10-CM | POA: Diagnosis not present

## 2017-03-12 DIAGNOSIS — H401133 Primary open-angle glaucoma, bilateral, severe stage: Secondary | ICD-10-CM | POA: Diagnosis not present

## 2017-03-12 DIAGNOSIS — H16223 Keratoconjunctivitis sicca, not specified as Sjogren's, bilateral: Secondary | ICD-10-CM | POA: Diagnosis not present

## 2017-03-12 DIAGNOSIS — H348112 Central retinal vein occlusion, right eye, stable: Secondary | ICD-10-CM | POA: Diagnosis not present

## 2017-03-12 DIAGNOSIS — H18231 Secondary corneal edema, right eye: Secondary | ICD-10-CM | POA: Diagnosis not present

## 2017-03-12 DIAGNOSIS — H47233 Glaucomatous optic atrophy, bilateral: Secondary | ICD-10-CM | POA: Diagnosis not present

## 2017-03-21 DIAGNOSIS — H348112 Central retinal vein occlusion, right eye, stable: Secondary | ICD-10-CM | POA: Diagnosis not present

## 2017-03-21 DIAGNOSIS — H401133 Primary open-angle glaucoma, bilateral, severe stage: Secondary | ICD-10-CM | POA: Diagnosis not present

## 2017-03-21 DIAGNOSIS — H16223 Keratoconjunctivitis sicca, not specified as Sjogren's, bilateral: Secondary | ICD-10-CM | POA: Diagnosis not present

## 2017-03-21 DIAGNOSIS — H18231 Secondary corneal edema, right eye: Secondary | ICD-10-CM | POA: Diagnosis not present

## 2017-03-21 DIAGNOSIS — H47233 Glaucomatous optic atrophy, bilateral: Secondary | ICD-10-CM | POA: Diagnosis not present

## 2017-06-06 DIAGNOSIS — R5381 Other malaise: Secondary | ICD-10-CM | POA: Diagnosis not present

## 2017-06-06 DIAGNOSIS — L853 Xerosis cutis: Secondary | ICD-10-CM | POA: Diagnosis not present

## 2017-06-06 DIAGNOSIS — L989 Disorder of the skin and subcutaneous tissue, unspecified: Secondary | ICD-10-CM | POA: Diagnosis not present

## 2017-06-06 DIAGNOSIS — N183 Chronic kidney disease, stage 3 (moderate): Secondary | ICD-10-CM | POA: Diagnosis not present

## 2017-06-06 DIAGNOSIS — I10 Essential (primary) hypertension: Secondary | ICD-10-CM | POA: Diagnosis not present

## 2017-06-06 DIAGNOSIS — E1122 Type 2 diabetes mellitus with diabetic chronic kidney disease: Secondary | ICD-10-CM | POA: Diagnosis not present

## 2017-06-10 DIAGNOSIS — M25511 Pain in right shoulder: Secondary | ICD-10-CM | POA: Diagnosis not present

## 2017-06-10 DIAGNOSIS — H548 Legal blindness, as defined in USA: Secondary | ICD-10-CM | POA: Diagnosis not present

## 2017-06-10 DIAGNOSIS — N183 Chronic kidney disease, stage 3 (moderate): Secondary | ICD-10-CM | POA: Diagnosis not present

## 2017-06-10 DIAGNOSIS — M109 Gout, unspecified: Secondary | ICD-10-CM | POA: Diagnosis not present

## 2017-06-10 DIAGNOSIS — G894 Chronic pain syndrome: Secondary | ICD-10-CM | POA: Diagnosis not present

## 2017-06-10 DIAGNOSIS — E1122 Type 2 diabetes mellitus with diabetic chronic kidney disease: Secondary | ICD-10-CM | POA: Diagnosis not present

## 2017-06-10 DIAGNOSIS — L853 Xerosis cutis: Secondary | ICD-10-CM | POA: Diagnosis not present

## 2017-06-10 DIAGNOSIS — E669 Obesity, unspecified: Secondary | ICD-10-CM | POA: Diagnosis not present

## 2017-06-10 DIAGNOSIS — Z7984 Long term (current) use of oral hypoglycemic drugs: Secondary | ICD-10-CM | POA: Diagnosis not present

## 2017-06-10 DIAGNOSIS — L989 Disorder of the skin and subcutaneous tissue, unspecified: Secondary | ICD-10-CM | POA: Diagnosis not present

## 2017-06-10 DIAGNOSIS — M170 Bilateral primary osteoarthritis of knee: Secondary | ICD-10-CM | POA: Diagnosis not present

## 2017-06-10 DIAGNOSIS — Z87891 Personal history of nicotine dependence: Secondary | ICD-10-CM | POA: Diagnosis not present

## 2017-06-10 DIAGNOSIS — E785 Hyperlipidemia, unspecified: Secondary | ICD-10-CM | POA: Diagnosis not present

## 2017-06-10 DIAGNOSIS — I129 Hypertensive chronic kidney disease with stage 1 through stage 4 chronic kidney disease, or unspecified chronic kidney disease: Secondary | ICD-10-CM | POA: Diagnosis not present

## 2017-06-20 DIAGNOSIS — G894 Chronic pain syndrome: Secondary | ICD-10-CM | POA: Diagnosis not present

## 2017-06-20 DIAGNOSIS — N183 Chronic kidney disease, stage 3 (moderate): Secondary | ICD-10-CM | POA: Diagnosis not present

## 2017-06-20 DIAGNOSIS — M170 Bilateral primary osteoarthritis of knee: Secondary | ICD-10-CM | POA: Diagnosis not present

## 2017-06-20 DIAGNOSIS — E1122 Type 2 diabetes mellitus with diabetic chronic kidney disease: Secondary | ICD-10-CM | POA: Diagnosis not present

## 2017-06-20 DIAGNOSIS — M25511 Pain in right shoulder: Secondary | ICD-10-CM | POA: Diagnosis not present

## 2017-06-20 DIAGNOSIS — I129 Hypertensive chronic kidney disease with stage 1 through stage 4 chronic kidney disease, or unspecified chronic kidney disease: Secondary | ICD-10-CM | POA: Diagnosis not present

## 2017-06-21 DIAGNOSIS — M170 Bilateral primary osteoarthritis of knee: Secondary | ICD-10-CM | POA: Diagnosis not present

## 2017-06-21 DIAGNOSIS — E1122 Type 2 diabetes mellitus with diabetic chronic kidney disease: Secondary | ICD-10-CM | POA: Diagnosis not present

## 2017-06-21 DIAGNOSIS — N183 Chronic kidney disease, stage 3 (moderate): Secondary | ICD-10-CM | POA: Diagnosis not present

## 2017-06-21 DIAGNOSIS — I129 Hypertensive chronic kidney disease with stage 1 through stage 4 chronic kidney disease, or unspecified chronic kidney disease: Secondary | ICD-10-CM | POA: Diagnosis not present

## 2017-06-21 DIAGNOSIS — M25511 Pain in right shoulder: Secondary | ICD-10-CM | POA: Diagnosis not present

## 2017-06-21 DIAGNOSIS — G894 Chronic pain syndrome: Secondary | ICD-10-CM | POA: Diagnosis not present

## 2017-06-22 DIAGNOSIS — M170 Bilateral primary osteoarthritis of knee: Secondary | ICD-10-CM | POA: Diagnosis not present

## 2017-06-22 DIAGNOSIS — N183 Chronic kidney disease, stage 3 (moderate): Secondary | ICD-10-CM | POA: Diagnosis not present

## 2017-06-22 DIAGNOSIS — E1122 Type 2 diabetes mellitus with diabetic chronic kidney disease: Secondary | ICD-10-CM | POA: Diagnosis not present

## 2017-06-22 DIAGNOSIS — I129 Hypertensive chronic kidney disease with stage 1 through stage 4 chronic kidney disease, or unspecified chronic kidney disease: Secondary | ICD-10-CM | POA: Diagnosis not present

## 2017-06-22 DIAGNOSIS — M25511 Pain in right shoulder: Secondary | ICD-10-CM | POA: Diagnosis not present

## 2017-06-22 DIAGNOSIS — G894 Chronic pain syndrome: Secondary | ICD-10-CM | POA: Diagnosis not present

## 2017-06-24 DIAGNOSIS — N183 Chronic kidney disease, stage 3 (moderate): Secondary | ICD-10-CM | POA: Diagnosis not present

## 2017-06-24 DIAGNOSIS — G894 Chronic pain syndrome: Secondary | ICD-10-CM | POA: Diagnosis not present

## 2017-06-24 DIAGNOSIS — I129 Hypertensive chronic kidney disease with stage 1 through stage 4 chronic kidney disease, or unspecified chronic kidney disease: Secondary | ICD-10-CM | POA: Diagnosis not present

## 2017-06-24 DIAGNOSIS — M170 Bilateral primary osteoarthritis of knee: Secondary | ICD-10-CM | POA: Diagnosis not present

## 2017-06-24 DIAGNOSIS — E1122 Type 2 diabetes mellitus with diabetic chronic kidney disease: Secondary | ICD-10-CM | POA: Diagnosis not present

## 2017-06-24 DIAGNOSIS — M25511 Pain in right shoulder: Secondary | ICD-10-CM | POA: Diagnosis not present

## 2017-06-25 DIAGNOSIS — G894 Chronic pain syndrome: Secondary | ICD-10-CM | POA: Diagnosis not present

## 2017-06-25 DIAGNOSIS — E1122 Type 2 diabetes mellitus with diabetic chronic kidney disease: Secondary | ICD-10-CM | POA: Diagnosis not present

## 2017-06-25 DIAGNOSIS — M25511 Pain in right shoulder: Secondary | ICD-10-CM | POA: Diagnosis not present

## 2017-06-25 DIAGNOSIS — E1142 Type 2 diabetes mellitus with diabetic polyneuropathy: Secondary | ICD-10-CM | POA: Diagnosis not present

## 2017-06-25 DIAGNOSIS — N183 Chronic kidney disease, stage 3 (moderate): Secondary | ICD-10-CM | POA: Diagnosis not present

## 2017-06-25 DIAGNOSIS — M170 Bilateral primary osteoarthritis of knee: Secondary | ICD-10-CM | POA: Diagnosis not present

## 2017-06-25 DIAGNOSIS — I129 Hypertensive chronic kidney disease with stage 1 through stage 4 chronic kidney disease, or unspecified chronic kidney disease: Secondary | ICD-10-CM | POA: Diagnosis not present

## 2017-06-26 DIAGNOSIS — M170 Bilateral primary osteoarthritis of knee: Secondary | ICD-10-CM | POA: Diagnosis not present

## 2017-06-26 DIAGNOSIS — I129 Hypertensive chronic kidney disease with stage 1 through stage 4 chronic kidney disease, or unspecified chronic kidney disease: Secondary | ICD-10-CM | POA: Diagnosis not present

## 2017-06-26 DIAGNOSIS — N183 Chronic kidney disease, stage 3 (moderate): Secondary | ICD-10-CM | POA: Diagnosis not present

## 2017-06-26 DIAGNOSIS — M25511 Pain in right shoulder: Secondary | ICD-10-CM | POA: Diagnosis not present

## 2017-06-26 DIAGNOSIS — E1122 Type 2 diabetes mellitus with diabetic chronic kidney disease: Secondary | ICD-10-CM | POA: Diagnosis not present

## 2017-06-26 DIAGNOSIS — G894 Chronic pain syndrome: Secondary | ICD-10-CM | POA: Diagnosis not present

## 2017-06-27 DIAGNOSIS — M170 Bilateral primary osteoarthritis of knee: Secondary | ICD-10-CM | POA: Diagnosis not present

## 2017-06-27 DIAGNOSIS — E1122 Type 2 diabetes mellitus with diabetic chronic kidney disease: Secondary | ICD-10-CM | POA: Diagnosis not present

## 2017-06-27 DIAGNOSIS — N183 Chronic kidney disease, stage 3 (moderate): Secondary | ICD-10-CM | POA: Diagnosis not present

## 2017-06-27 DIAGNOSIS — G894 Chronic pain syndrome: Secondary | ICD-10-CM | POA: Diagnosis not present

## 2017-06-27 DIAGNOSIS — M25511 Pain in right shoulder: Secondary | ICD-10-CM | POA: Diagnosis not present

## 2017-06-27 DIAGNOSIS — I129 Hypertensive chronic kidney disease with stage 1 through stage 4 chronic kidney disease, or unspecified chronic kidney disease: Secondary | ICD-10-CM | POA: Diagnosis not present

## 2017-06-28 DIAGNOSIS — E1122 Type 2 diabetes mellitus with diabetic chronic kidney disease: Secondary | ICD-10-CM | POA: Diagnosis not present

## 2017-06-28 DIAGNOSIS — N183 Chronic kidney disease, stage 3 (moderate): Secondary | ICD-10-CM | POA: Diagnosis not present

## 2017-06-28 DIAGNOSIS — M25511 Pain in right shoulder: Secondary | ICD-10-CM | POA: Diagnosis not present

## 2017-06-28 DIAGNOSIS — G894 Chronic pain syndrome: Secondary | ICD-10-CM | POA: Diagnosis not present

## 2017-06-28 DIAGNOSIS — I129 Hypertensive chronic kidney disease with stage 1 through stage 4 chronic kidney disease, or unspecified chronic kidney disease: Secondary | ICD-10-CM | POA: Diagnosis not present

## 2017-06-28 DIAGNOSIS — M170 Bilateral primary osteoarthritis of knee: Secondary | ICD-10-CM | POA: Diagnosis not present

## 2017-06-29 DIAGNOSIS — M170 Bilateral primary osteoarthritis of knee: Secondary | ICD-10-CM | POA: Diagnosis not present

## 2017-06-29 DIAGNOSIS — E1122 Type 2 diabetes mellitus with diabetic chronic kidney disease: Secondary | ICD-10-CM | POA: Diagnosis not present

## 2017-06-29 DIAGNOSIS — N183 Chronic kidney disease, stage 3 (moderate): Secondary | ICD-10-CM | POA: Diagnosis not present

## 2017-06-29 DIAGNOSIS — I129 Hypertensive chronic kidney disease with stage 1 through stage 4 chronic kidney disease, or unspecified chronic kidney disease: Secondary | ICD-10-CM | POA: Diagnosis not present

## 2017-06-29 DIAGNOSIS — G894 Chronic pain syndrome: Secondary | ICD-10-CM | POA: Diagnosis not present

## 2017-06-29 DIAGNOSIS — M25511 Pain in right shoulder: Secondary | ICD-10-CM | POA: Diagnosis not present

## 2017-07-01 DIAGNOSIS — N183 Chronic kidney disease, stage 3 (moderate): Secondary | ICD-10-CM | POA: Diagnosis not present

## 2017-07-01 DIAGNOSIS — E1122 Type 2 diabetes mellitus with diabetic chronic kidney disease: Secondary | ICD-10-CM | POA: Diagnosis not present

## 2017-07-01 DIAGNOSIS — M170 Bilateral primary osteoarthritis of knee: Secondary | ICD-10-CM | POA: Diagnosis not present

## 2017-07-01 DIAGNOSIS — G894 Chronic pain syndrome: Secondary | ICD-10-CM | POA: Diagnosis not present

## 2017-07-01 DIAGNOSIS — I129 Hypertensive chronic kidney disease with stage 1 through stage 4 chronic kidney disease, or unspecified chronic kidney disease: Secondary | ICD-10-CM | POA: Diagnosis not present

## 2017-07-01 DIAGNOSIS — M25511 Pain in right shoulder: Secondary | ICD-10-CM | POA: Diagnosis not present

## 2017-07-02 DIAGNOSIS — N183 Chronic kidney disease, stage 3 (moderate): Secondary | ICD-10-CM | POA: Diagnosis not present

## 2017-07-02 DIAGNOSIS — E1122 Type 2 diabetes mellitus with diabetic chronic kidney disease: Secondary | ICD-10-CM | POA: Diagnosis not present

## 2017-07-02 DIAGNOSIS — G894 Chronic pain syndrome: Secondary | ICD-10-CM | POA: Diagnosis not present

## 2017-07-02 DIAGNOSIS — M25511 Pain in right shoulder: Secondary | ICD-10-CM | POA: Diagnosis not present

## 2017-07-02 DIAGNOSIS — I129 Hypertensive chronic kidney disease with stage 1 through stage 4 chronic kidney disease, or unspecified chronic kidney disease: Secondary | ICD-10-CM | POA: Diagnosis not present

## 2017-07-02 DIAGNOSIS — M170 Bilateral primary osteoarthritis of knee: Secondary | ICD-10-CM | POA: Diagnosis not present

## 2017-07-03 DIAGNOSIS — M170 Bilateral primary osteoarthritis of knee: Secondary | ICD-10-CM | POA: Diagnosis not present

## 2017-07-03 DIAGNOSIS — I129 Hypertensive chronic kidney disease with stage 1 through stage 4 chronic kidney disease, or unspecified chronic kidney disease: Secondary | ICD-10-CM | POA: Diagnosis not present

## 2017-07-03 DIAGNOSIS — G894 Chronic pain syndrome: Secondary | ICD-10-CM | POA: Diagnosis not present

## 2017-07-03 DIAGNOSIS — E1122 Type 2 diabetes mellitus with diabetic chronic kidney disease: Secondary | ICD-10-CM | POA: Diagnosis not present

## 2017-07-03 DIAGNOSIS — M25511 Pain in right shoulder: Secondary | ICD-10-CM | POA: Diagnosis not present

## 2017-07-03 DIAGNOSIS — N183 Chronic kidney disease, stage 3 (moderate): Secondary | ICD-10-CM | POA: Diagnosis not present

## 2017-07-04 DIAGNOSIS — Z96611 Presence of right artificial shoulder joint: Secondary | ICD-10-CM | POA: Diagnosis not present

## 2017-07-04 DIAGNOSIS — G894 Chronic pain syndrome: Secondary | ICD-10-CM | POA: Diagnosis not present

## 2017-07-04 DIAGNOSIS — E1122 Type 2 diabetes mellitus with diabetic chronic kidney disease: Secondary | ICD-10-CM | POA: Diagnosis not present

## 2017-07-04 DIAGNOSIS — M170 Bilateral primary osteoarthritis of knee: Secondary | ICD-10-CM | POA: Diagnosis not present

## 2017-07-04 DIAGNOSIS — M25511 Pain in right shoulder: Secondary | ICD-10-CM | POA: Diagnosis not present

## 2017-07-04 DIAGNOSIS — I129 Hypertensive chronic kidney disease with stage 1 through stage 4 chronic kidney disease, or unspecified chronic kidney disease: Secondary | ICD-10-CM | POA: Diagnosis not present

## 2017-07-04 DIAGNOSIS — N183 Chronic kidney disease, stage 3 (moderate): Secondary | ICD-10-CM | POA: Diagnosis not present

## 2017-07-05 DIAGNOSIS — M170 Bilateral primary osteoarthritis of knee: Secondary | ICD-10-CM | POA: Diagnosis not present

## 2017-07-05 DIAGNOSIS — N183 Chronic kidney disease, stage 3 (moderate): Secondary | ICD-10-CM | POA: Diagnosis not present

## 2017-07-05 DIAGNOSIS — I129 Hypertensive chronic kidney disease with stage 1 through stage 4 chronic kidney disease, or unspecified chronic kidney disease: Secondary | ICD-10-CM | POA: Diagnosis not present

## 2017-07-05 DIAGNOSIS — E1122 Type 2 diabetes mellitus with diabetic chronic kidney disease: Secondary | ICD-10-CM | POA: Diagnosis not present

## 2017-07-05 DIAGNOSIS — M25511 Pain in right shoulder: Secondary | ICD-10-CM | POA: Diagnosis not present

## 2017-07-05 DIAGNOSIS — G894 Chronic pain syndrome: Secondary | ICD-10-CM | POA: Diagnosis not present

## 2017-07-08 DIAGNOSIS — M170 Bilateral primary osteoarthritis of knee: Secondary | ICD-10-CM | POA: Diagnosis not present

## 2017-07-08 DIAGNOSIS — M25511 Pain in right shoulder: Secondary | ICD-10-CM | POA: Diagnosis not present

## 2017-07-08 DIAGNOSIS — E1122 Type 2 diabetes mellitus with diabetic chronic kidney disease: Secondary | ICD-10-CM | POA: Diagnosis not present

## 2017-07-08 DIAGNOSIS — G894 Chronic pain syndrome: Secondary | ICD-10-CM | POA: Diagnosis not present

## 2017-07-08 DIAGNOSIS — I129 Hypertensive chronic kidney disease with stage 1 through stage 4 chronic kidney disease, or unspecified chronic kidney disease: Secondary | ICD-10-CM | POA: Diagnosis not present

## 2017-07-08 DIAGNOSIS — N183 Chronic kidney disease, stage 3 (moderate): Secondary | ICD-10-CM | POA: Diagnosis not present

## 2017-07-09 DIAGNOSIS — I129 Hypertensive chronic kidney disease with stage 1 through stage 4 chronic kidney disease, or unspecified chronic kidney disease: Secondary | ICD-10-CM | POA: Diagnosis not present

## 2017-07-09 DIAGNOSIS — N183 Chronic kidney disease, stage 3 (moderate): Secondary | ICD-10-CM | POA: Diagnosis not present

## 2017-07-09 DIAGNOSIS — G894 Chronic pain syndrome: Secondary | ICD-10-CM | POA: Diagnosis not present

## 2017-07-09 DIAGNOSIS — E1122 Type 2 diabetes mellitus with diabetic chronic kidney disease: Secondary | ICD-10-CM | POA: Diagnosis not present

## 2017-07-09 DIAGNOSIS — M170 Bilateral primary osteoarthritis of knee: Secondary | ICD-10-CM | POA: Diagnosis not present

## 2017-07-09 DIAGNOSIS — M25511 Pain in right shoulder: Secondary | ICD-10-CM | POA: Diagnosis not present

## 2017-07-10 DIAGNOSIS — M25511 Pain in right shoulder: Secondary | ICD-10-CM | POA: Diagnosis not present

## 2017-07-10 DIAGNOSIS — I129 Hypertensive chronic kidney disease with stage 1 through stage 4 chronic kidney disease, or unspecified chronic kidney disease: Secondary | ICD-10-CM | POA: Diagnosis not present

## 2017-07-10 DIAGNOSIS — G894 Chronic pain syndrome: Secondary | ICD-10-CM | POA: Diagnosis not present

## 2017-07-10 DIAGNOSIS — M170 Bilateral primary osteoarthritis of knee: Secondary | ICD-10-CM | POA: Diagnosis not present

## 2017-07-10 DIAGNOSIS — E1122 Type 2 diabetes mellitus with diabetic chronic kidney disease: Secondary | ICD-10-CM | POA: Diagnosis not present

## 2017-07-10 DIAGNOSIS — N183 Chronic kidney disease, stage 3 (moderate): Secondary | ICD-10-CM | POA: Diagnosis not present

## 2017-07-11 DIAGNOSIS — G894 Chronic pain syndrome: Secondary | ICD-10-CM | POA: Diagnosis not present

## 2017-07-11 DIAGNOSIS — E1122 Type 2 diabetes mellitus with diabetic chronic kidney disease: Secondary | ICD-10-CM | POA: Diagnosis not present

## 2017-07-11 DIAGNOSIS — M170 Bilateral primary osteoarthritis of knee: Secondary | ICD-10-CM | POA: Diagnosis not present

## 2017-07-11 DIAGNOSIS — M25511 Pain in right shoulder: Secondary | ICD-10-CM | POA: Diagnosis not present

## 2017-07-11 DIAGNOSIS — I129 Hypertensive chronic kidney disease with stage 1 through stage 4 chronic kidney disease, or unspecified chronic kidney disease: Secondary | ICD-10-CM | POA: Diagnosis not present

## 2017-07-11 DIAGNOSIS — N183 Chronic kidney disease, stage 3 (moderate): Secondary | ICD-10-CM | POA: Diagnosis not present

## 2017-07-15 DIAGNOSIS — E1122 Type 2 diabetes mellitus with diabetic chronic kidney disease: Secondary | ICD-10-CM | POA: Diagnosis not present

## 2017-07-15 DIAGNOSIS — G894 Chronic pain syndrome: Secondary | ICD-10-CM | POA: Diagnosis not present

## 2017-07-15 DIAGNOSIS — M170 Bilateral primary osteoarthritis of knee: Secondary | ICD-10-CM | POA: Diagnosis not present

## 2017-07-15 DIAGNOSIS — M25511 Pain in right shoulder: Secondary | ICD-10-CM | POA: Diagnosis not present

## 2017-07-15 DIAGNOSIS — I129 Hypertensive chronic kidney disease with stage 1 through stage 4 chronic kidney disease, or unspecified chronic kidney disease: Secondary | ICD-10-CM | POA: Diagnosis not present

## 2017-07-15 DIAGNOSIS — N183 Chronic kidney disease, stage 3 (moderate): Secondary | ICD-10-CM | POA: Diagnosis not present

## 2017-07-17 DIAGNOSIS — N183 Chronic kidney disease, stage 3 (moderate): Secondary | ICD-10-CM | POA: Diagnosis not present

## 2017-07-17 DIAGNOSIS — E1122 Type 2 diabetes mellitus with diabetic chronic kidney disease: Secondary | ICD-10-CM | POA: Diagnosis not present

## 2017-07-17 DIAGNOSIS — M25511 Pain in right shoulder: Secondary | ICD-10-CM | POA: Diagnosis not present

## 2017-07-17 DIAGNOSIS — M170 Bilateral primary osteoarthritis of knee: Secondary | ICD-10-CM | POA: Diagnosis not present

## 2017-07-17 DIAGNOSIS — I129 Hypertensive chronic kidney disease with stage 1 through stage 4 chronic kidney disease, or unspecified chronic kidney disease: Secondary | ICD-10-CM | POA: Diagnosis not present

## 2017-07-17 DIAGNOSIS — G894 Chronic pain syndrome: Secondary | ICD-10-CM | POA: Diagnosis not present

## 2017-07-18 DIAGNOSIS — N183 Chronic kidney disease, stage 3 (moderate): Secondary | ICD-10-CM | POA: Diagnosis not present

## 2017-07-18 DIAGNOSIS — E1122 Type 2 diabetes mellitus with diabetic chronic kidney disease: Secondary | ICD-10-CM | POA: Diagnosis not present

## 2017-07-18 DIAGNOSIS — G894 Chronic pain syndrome: Secondary | ICD-10-CM | POA: Diagnosis not present

## 2017-07-18 DIAGNOSIS — I129 Hypertensive chronic kidney disease with stage 1 through stage 4 chronic kidney disease, or unspecified chronic kidney disease: Secondary | ICD-10-CM | POA: Diagnosis not present

## 2017-07-18 DIAGNOSIS — M25511 Pain in right shoulder: Secondary | ICD-10-CM | POA: Diagnosis not present

## 2017-07-18 DIAGNOSIS — M170 Bilateral primary osteoarthritis of knee: Secondary | ICD-10-CM | POA: Diagnosis not present

## 2017-07-19 DIAGNOSIS — M25511 Pain in right shoulder: Secondary | ICD-10-CM | POA: Diagnosis not present

## 2017-07-19 DIAGNOSIS — I129 Hypertensive chronic kidney disease with stage 1 through stage 4 chronic kidney disease, or unspecified chronic kidney disease: Secondary | ICD-10-CM | POA: Diagnosis not present

## 2017-07-19 DIAGNOSIS — N183 Chronic kidney disease, stage 3 (moderate): Secondary | ICD-10-CM | POA: Diagnosis not present

## 2017-07-19 DIAGNOSIS — M170 Bilateral primary osteoarthritis of knee: Secondary | ICD-10-CM | POA: Diagnosis not present

## 2017-07-19 DIAGNOSIS — E1122 Type 2 diabetes mellitus with diabetic chronic kidney disease: Secondary | ICD-10-CM | POA: Diagnosis not present

## 2017-07-19 DIAGNOSIS — G894 Chronic pain syndrome: Secondary | ICD-10-CM | POA: Diagnosis not present

## 2017-07-22 ENCOUNTER — Ambulatory Visit (INDEPENDENT_AMBULATORY_CARE_PROVIDER_SITE_OTHER): Payer: Medicare HMO | Admitting: Ophthalmology

## 2017-07-22 DIAGNOSIS — M25511 Pain in right shoulder: Secondary | ICD-10-CM | POA: Diagnosis not present

## 2017-07-22 DIAGNOSIS — M170 Bilateral primary osteoarthritis of knee: Secondary | ICD-10-CM | POA: Diagnosis not present

## 2017-07-22 DIAGNOSIS — E1122 Type 2 diabetes mellitus with diabetic chronic kidney disease: Secondary | ICD-10-CM | POA: Diagnosis not present

## 2017-07-22 DIAGNOSIS — I129 Hypertensive chronic kidney disease with stage 1 through stage 4 chronic kidney disease, or unspecified chronic kidney disease: Secondary | ICD-10-CM | POA: Diagnosis not present

## 2017-07-22 DIAGNOSIS — N183 Chronic kidney disease, stage 3 (moderate): Secondary | ICD-10-CM | POA: Diagnosis not present

## 2017-07-22 DIAGNOSIS — G894 Chronic pain syndrome: Secondary | ICD-10-CM | POA: Diagnosis not present

## 2017-07-23 DIAGNOSIS — M25511 Pain in right shoulder: Secondary | ICD-10-CM | POA: Diagnosis not present

## 2017-07-23 DIAGNOSIS — E1122 Type 2 diabetes mellitus with diabetic chronic kidney disease: Secondary | ICD-10-CM | POA: Diagnosis not present

## 2017-07-23 DIAGNOSIS — I129 Hypertensive chronic kidney disease with stage 1 through stage 4 chronic kidney disease, or unspecified chronic kidney disease: Secondary | ICD-10-CM | POA: Diagnosis not present

## 2017-07-23 DIAGNOSIS — M170 Bilateral primary osteoarthritis of knee: Secondary | ICD-10-CM | POA: Diagnosis not present

## 2017-07-23 DIAGNOSIS — N183 Chronic kidney disease, stage 3 (moderate): Secondary | ICD-10-CM | POA: Diagnosis not present

## 2017-07-23 DIAGNOSIS — G894 Chronic pain syndrome: Secondary | ICD-10-CM | POA: Diagnosis not present

## 2017-07-24 DIAGNOSIS — G894 Chronic pain syndrome: Secondary | ICD-10-CM | POA: Diagnosis not present

## 2017-07-24 DIAGNOSIS — M25511 Pain in right shoulder: Secondary | ICD-10-CM | POA: Diagnosis not present

## 2017-07-24 DIAGNOSIS — E1122 Type 2 diabetes mellitus with diabetic chronic kidney disease: Secondary | ICD-10-CM | POA: Diagnosis not present

## 2017-07-24 DIAGNOSIS — N183 Chronic kidney disease, stage 3 (moderate): Secondary | ICD-10-CM | POA: Diagnosis not present

## 2017-07-24 DIAGNOSIS — M170 Bilateral primary osteoarthritis of knee: Secondary | ICD-10-CM | POA: Diagnosis not present

## 2017-07-24 DIAGNOSIS — I129 Hypertensive chronic kidney disease with stage 1 through stage 4 chronic kidney disease, or unspecified chronic kidney disease: Secondary | ICD-10-CM | POA: Diagnosis not present

## 2017-07-25 DIAGNOSIS — I129 Hypertensive chronic kidney disease with stage 1 through stage 4 chronic kidney disease, or unspecified chronic kidney disease: Secondary | ICD-10-CM | POA: Diagnosis not present

## 2017-07-25 DIAGNOSIS — M25511 Pain in right shoulder: Secondary | ICD-10-CM | POA: Diagnosis not present

## 2017-07-25 DIAGNOSIS — N183 Chronic kidney disease, stage 3 (moderate): Secondary | ICD-10-CM | POA: Diagnosis not present

## 2017-07-25 DIAGNOSIS — G894 Chronic pain syndrome: Secondary | ICD-10-CM | POA: Diagnosis not present

## 2017-07-25 DIAGNOSIS — E1122 Type 2 diabetes mellitus with diabetic chronic kidney disease: Secondary | ICD-10-CM | POA: Diagnosis not present

## 2017-07-25 DIAGNOSIS — M170 Bilateral primary osteoarthritis of knee: Secondary | ICD-10-CM | POA: Diagnosis not present

## 2017-07-26 DIAGNOSIS — N183 Chronic kidney disease, stage 3 (moderate): Secondary | ICD-10-CM | POA: Diagnosis not present

## 2017-07-26 DIAGNOSIS — M25511 Pain in right shoulder: Secondary | ICD-10-CM | POA: Diagnosis not present

## 2017-07-26 DIAGNOSIS — M170 Bilateral primary osteoarthritis of knee: Secondary | ICD-10-CM | POA: Diagnosis not present

## 2017-07-26 DIAGNOSIS — G894 Chronic pain syndrome: Secondary | ICD-10-CM | POA: Diagnosis not present

## 2017-07-26 DIAGNOSIS — E1122 Type 2 diabetes mellitus with diabetic chronic kidney disease: Secondary | ICD-10-CM | POA: Diagnosis not present

## 2017-07-26 DIAGNOSIS — I129 Hypertensive chronic kidney disease with stage 1 through stage 4 chronic kidney disease, or unspecified chronic kidney disease: Secondary | ICD-10-CM | POA: Diagnosis not present

## 2017-07-29 DIAGNOSIS — M25511 Pain in right shoulder: Secondary | ICD-10-CM | POA: Diagnosis not present

## 2017-07-29 DIAGNOSIS — M170 Bilateral primary osteoarthritis of knee: Secondary | ICD-10-CM | POA: Diagnosis not present

## 2017-07-29 DIAGNOSIS — G894 Chronic pain syndrome: Secondary | ICD-10-CM | POA: Diagnosis not present

## 2017-07-29 DIAGNOSIS — I129 Hypertensive chronic kidney disease with stage 1 through stage 4 chronic kidney disease, or unspecified chronic kidney disease: Secondary | ICD-10-CM | POA: Diagnosis not present

## 2017-07-29 DIAGNOSIS — N183 Chronic kidney disease, stage 3 (moderate): Secondary | ICD-10-CM | POA: Diagnosis not present

## 2017-07-29 DIAGNOSIS — E1122 Type 2 diabetes mellitus with diabetic chronic kidney disease: Secondary | ICD-10-CM | POA: Diagnosis not present

## 2017-07-30 DIAGNOSIS — M25511 Pain in right shoulder: Secondary | ICD-10-CM | POA: Diagnosis not present

## 2017-07-30 DIAGNOSIS — E1122 Type 2 diabetes mellitus with diabetic chronic kidney disease: Secondary | ICD-10-CM | POA: Diagnosis not present

## 2017-07-30 DIAGNOSIS — M170 Bilateral primary osteoarthritis of knee: Secondary | ICD-10-CM | POA: Diagnosis not present

## 2017-07-30 DIAGNOSIS — N183 Chronic kidney disease, stage 3 (moderate): Secondary | ICD-10-CM | POA: Diagnosis not present

## 2017-07-30 DIAGNOSIS — I129 Hypertensive chronic kidney disease with stage 1 through stage 4 chronic kidney disease, or unspecified chronic kidney disease: Secondary | ICD-10-CM | POA: Diagnosis not present

## 2017-07-30 DIAGNOSIS — G894 Chronic pain syndrome: Secondary | ICD-10-CM | POA: Diagnosis not present

## 2017-08-01 DIAGNOSIS — I129 Hypertensive chronic kidney disease with stage 1 through stage 4 chronic kidney disease, or unspecified chronic kidney disease: Secondary | ICD-10-CM | POA: Diagnosis not present

## 2017-08-01 DIAGNOSIS — G894 Chronic pain syndrome: Secondary | ICD-10-CM | POA: Diagnosis not present

## 2017-08-01 DIAGNOSIS — N183 Chronic kidney disease, stage 3 (moderate): Secondary | ICD-10-CM | POA: Diagnosis not present

## 2017-08-01 DIAGNOSIS — M170 Bilateral primary osteoarthritis of knee: Secondary | ICD-10-CM | POA: Diagnosis not present

## 2017-08-01 DIAGNOSIS — E1122 Type 2 diabetes mellitus with diabetic chronic kidney disease: Secondary | ICD-10-CM | POA: Diagnosis not present

## 2017-08-01 DIAGNOSIS — M25511 Pain in right shoulder: Secondary | ICD-10-CM | POA: Diagnosis not present

## 2017-08-05 DIAGNOSIS — E1122 Type 2 diabetes mellitus with diabetic chronic kidney disease: Secondary | ICD-10-CM | POA: Diagnosis not present

## 2017-08-05 DIAGNOSIS — G894 Chronic pain syndrome: Secondary | ICD-10-CM | POA: Diagnosis not present

## 2017-08-05 DIAGNOSIS — M25511 Pain in right shoulder: Secondary | ICD-10-CM | POA: Diagnosis not present

## 2017-08-05 DIAGNOSIS — N183 Chronic kidney disease, stage 3 (moderate): Secondary | ICD-10-CM | POA: Diagnosis not present

## 2017-08-05 DIAGNOSIS — M170 Bilateral primary osteoarthritis of knee: Secondary | ICD-10-CM | POA: Diagnosis not present

## 2017-08-05 DIAGNOSIS — I129 Hypertensive chronic kidney disease with stage 1 through stage 4 chronic kidney disease, or unspecified chronic kidney disease: Secondary | ICD-10-CM | POA: Diagnosis not present

## 2017-08-06 DIAGNOSIS — I129 Hypertensive chronic kidney disease with stage 1 through stage 4 chronic kidney disease, or unspecified chronic kidney disease: Secondary | ICD-10-CM | POA: Diagnosis not present

## 2017-08-06 DIAGNOSIS — G894 Chronic pain syndrome: Secondary | ICD-10-CM | POA: Diagnosis not present

## 2017-08-06 DIAGNOSIS — N183 Chronic kidney disease, stage 3 (moderate): Secondary | ICD-10-CM | POA: Diagnosis not present

## 2017-08-06 DIAGNOSIS — M170 Bilateral primary osteoarthritis of knee: Secondary | ICD-10-CM | POA: Diagnosis not present

## 2017-08-06 DIAGNOSIS — E1122 Type 2 diabetes mellitus with diabetic chronic kidney disease: Secondary | ICD-10-CM | POA: Diagnosis not present

## 2017-08-06 DIAGNOSIS — M25511 Pain in right shoulder: Secondary | ICD-10-CM | POA: Diagnosis not present

## 2017-08-08 DIAGNOSIS — E1122 Type 2 diabetes mellitus with diabetic chronic kidney disease: Secondary | ICD-10-CM | POA: Diagnosis not present

## 2017-08-08 DIAGNOSIS — M25511 Pain in right shoulder: Secondary | ICD-10-CM | POA: Diagnosis not present

## 2017-08-08 DIAGNOSIS — N183 Chronic kidney disease, stage 3 (moderate): Secondary | ICD-10-CM | POA: Diagnosis not present

## 2017-08-08 DIAGNOSIS — M170 Bilateral primary osteoarthritis of knee: Secondary | ICD-10-CM | POA: Diagnosis not present

## 2017-08-08 DIAGNOSIS — I129 Hypertensive chronic kidney disease with stage 1 through stage 4 chronic kidney disease, or unspecified chronic kidney disease: Secondary | ICD-10-CM | POA: Diagnosis not present

## 2017-08-08 DIAGNOSIS — G894 Chronic pain syndrome: Secondary | ICD-10-CM | POA: Diagnosis not present

## 2017-08-09 DIAGNOSIS — M25511 Pain in right shoulder: Secondary | ICD-10-CM | POA: Diagnosis not present

## 2017-08-09 DIAGNOSIS — L989 Disorder of the skin and subcutaneous tissue, unspecified: Secondary | ICD-10-CM | POA: Diagnosis not present

## 2017-08-09 DIAGNOSIS — M170 Bilateral primary osteoarthritis of knee: Secondary | ICD-10-CM | POA: Diagnosis not present

## 2017-08-09 DIAGNOSIS — E669 Obesity, unspecified: Secondary | ICD-10-CM | POA: Diagnosis not present

## 2017-08-09 DIAGNOSIS — L853 Xerosis cutis: Secondary | ICD-10-CM | POA: Diagnosis not present

## 2017-08-09 DIAGNOSIS — E785 Hyperlipidemia, unspecified: Secondary | ICD-10-CM | POA: Diagnosis not present

## 2017-08-09 DIAGNOSIS — Z7984 Long term (current) use of oral hypoglycemic drugs: Secondary | ICD-10-CM | POA: Diagnosis not present

## 2017-08-09 DIAGNOSIS — I129 Hypertensive chronic kidney disease with stage 1 through stage 4 chronic kidney disease, or unspecified chronic kidney disease: Secondary | ICD-10-CM | POA: Diagnosis not present

## 2017-08-09 DIAGNOSIS — E1122 Type 2 diabetes mellitus with diabetic chronic kidney disease: Secondary | ICD-10-CM | POA: Diagnosis not present

## 2017-08-09 DIAGNOSIS — G894 Chronic pain syndrome: Secondary | ICD-10-CM | POA: Diagnosis not present

## 2017-08-09 DIAGNOSIS — M109 Gout, unspecified: Secondary | ICD-10-CM | POA: Diagnosis not present

## 2017-08-09 DIAGNOSIS — H548 Legal blindness, as defined in USA: Secondary | ICD-10-CM | POA: Diagnosis not present

## 2017-08-09 DIAGNOSIS — N183 Chronic kidney disease, stage 3 (moderate): Secondary | ICD-10-CM | POA: Diagnosis not present

## 2017-08-09 DIAGNOSIS — Z87891 Personal history of nicotine dependence: Secondary | ICD-10-CM | POA: Diagnosis not present

## 2017-08-14 DIAGNOSIS — N183 Chronic kidney disease, stage 3 (moderate): Secondary | ICD-10-CM | POA: Diagnosis not present

## 2017-08-14 DIAGNOSIS — E1122 Type 2 diabetes mellitus with diabetic chronic kidney disease: Secondary | ICD-10-CM | POA: Diagnosis not present

## 2017-08-14 DIAGNOSIS — G894 Chronic pain syndrome: Secondary | ICD-10-CM | POA: Diagnosis not present

## 2017-08-14 DIAGNOSIS — I129 Hypertensive chronic kidney disease with stage 1 through stage 4 chronic kidney disease, or unspecified chronic kidney disease: Secondary | ICD-10-CM | POA: Diagnosis not present

## 2017-08-14 DIAGNOSIS — M25511 Pain in right shoulder: Secondary | ICD-10-CM | POA: Diagnosis not present

## 2017-08-14 DIAGNOSIS — M170 Bilateral primary osteoarthritis of knee: Secondary | ICD-10-CM | POA: Diagnosis not present

## 2017-11-10 ENCOUNTER — Inpatient Hospital Stay (HOSPITAL_COMMUNITY)
Admission: EM | Admit: 2017-11-10 | Discharge: 2017-11-18 | DRG: 246 | Disposition: A | Discharge status: Home / Self Care | Payer: Medicare HMO | Attending: Cardiovascular Disease | Admitting: Cardiovascular Disease

## 2017-11-10 ENCOUNTER — Emergency Department (HOSPITAL_COMMUNITY): Payer: Medicare HMO

## 2017-11-10 ENCOUNTER — Encounter (HOSPITAL_COMMUNITY)
Admission: EM | Disposition: A | Discharge status: Home / Self Care | Payer: Self-pay | Source: Home / Self Care | Attending: Cardiovascular Disease

## 2017-11-10 ENCOUNTER — Other Ambulatory Visit: Payer: Self-pay

## 2017-11-10 ENCOUNTER — Encounter (HOSPITAL_COMMUNITY): Payer: Self-pay | Admitting: Nurse Practitioner

## 2017-11-10 DIAGNOSIS — I10 Essential (primary) hypertension: Secondary | ICD-10-CM | POA: Diagnosis present

## 2017-11-10 DIAGNOSIS — Z955 Presence of coronary angioplasty implant and graft: Secondary | ICD-10-CM

## 2017-11-10 DIAGNOSIS — I2119 ST elevation (STEMI) myocardial infarction involving other coronary artery of inferior wall: Secondary | ICD-10-CM | POA: Diagnosis present

## 2017-11-10 DIAGNOSIS — H409 Unspecified glaucoma: Secondary | ICD-10-CM | POA: Diagnosis present

## 2017-11-10 DIAGNOSIS — E1122 Type 2 diabetes mellitus with diabetic chronic kidney disease: Secondary | ICD-10-CM | POA: Diagnosis present

## 2017-11-10 DIAGNOSIS — N183 Chronic kidney disease, stage 3 unspecified: Secondary | ICD-10-CM | POA: Diagnosis present

## 2017-11-10 DIAGNOSIS — R1114 Bilious vomiting: Secondary | ICD-10-CM

## 2017-11-10 DIAGNOSIS — M109 Gout, unspecified: Secondary | ICD-10-CM | POA: Diagnosis present

## 2017-11-10 DIAGNOSIS — G4700 Insomnia, unspecified: Secondary | ICD-10-CM | POA: Diagnosis present

## 2017-11-10 DIAGNOSIS — Z0189 Encounter for other specified special examinations: Secondary | ICD-10-CM

## 2017-11-10 DIAGNOSIS — E785 Hyperlipidemia, unspecified: Secondary | ICD-10-CM | POA: Diagnosis present

## 2017-11-10 DIAGNOSIS — Z8673 Personal history of transient ischemic attack (TIA), and cerebral infarction without residual deficits: Secondary | ICD-10-CM | POA: Diagnosis not present

## 2017-11-10 DIAGNOSIS — G4733 Obstructive sleep apnea (adult) (pediatric): Secondary | ICD-10-CM | POA: Diagnosis present

## 2017-11-10 DIAGNOSIS — E669 Obesity, unspecified: Secondary | ICD-10-CM | POA: Diagnosis present

## 2017-11-10 DIAGNOSIS — H548 Legal blindness, as defined in USA: Secondary | ICD-10-CM | POA: Diagnosis present

## 2017-11-10 DIAGNOSIS — Z4659 Encounter for fitting and adjustment of other gastrointestinal appliance and device: Secondary | ICD-10-CM

## 2017-11-10 DIAGNOSIS — R1110 Vomiting, unspecified: Secondary | ICD-10-CM

## 2017-11-10 DIAGNOSIS — I5042 Chronic combined systolic (congestive) and diastolic (congestive) heart failure: Secondary | ICD-10-CM | POA: Diagnosis present

## 2017-11-10 DIAGNOSIS — Z6832 Body mass index (BMI) 32.0-32.9, adult: Secondary | ICD-10-CM | POA: Diagnosis not present

## 2017-11-10 DIAGNOSIS — I252 Old myocardial infarction: Secondary | ICD-10-CM

## 2017-11-10 DIAGNOSIS — K56609 Unspecified intestinal obstruction, unspecified as to partial versus complete obstruction: Secondary | ICD-10-CM

## 2017-11-10 DIAGNOSIS — I130 Hypertensive heart and chronic kidney disease with heart failure and stage 1 through stage 4 chronic kidney disease, or unspecified chronic kidney disease: Secondary | ICD-10-CM | POA: Diagnosis present

## 2017-11-10 DIAGNOSIS — I2121 ST elevation (STEMI) myocardial infarction involving left circumflex coronary artery: Secondary | ICD-10-CM | POA: Diagnosis not present

## 2017-11-10 DIAGNOSIS — I5043 Acute on chronic combined systolic (congestive) and diastolic (congestive) heart failure: Secondary | ICD-10-CM | POA: Diagnosis present

## 2017-11-10 DIAGNOSIS — E1169 Type 2 diabetes mellitus with other specified complication: Secondary | ICD-10-CM | POA: Diagnosis present

## 2017-11-10 DIAGNOSIS — M7981 Nontraumatic hematoma of soft tissue: Secondary | ICD-10-CM | POA: Diagnosis not present

## 2017-11-10 DIAGNOSIS — Z87891 Personal history of nicotine dependence: Secondary | ICD-10-CM

## 2017-11-10 DIAGNOSIS — I213 ST elevation (STEMI) myocardial infarction of unspecified site: Secondary | ICD-10-CM

## 2017-11-10 DIAGNOSIS — I2510 Atherosclerotic heart disease of native coronary artery without angina pectoris: Secondary | ICD-10-CM | POA: Diagnosis not present

## 2017-11-10 DIAGNOSIS — K567 Ileus, unspecified: Secondary | ICD-10-CM | POA: Diagnosis not present

## 2017-11-10 DIAGNOSIS — G4730 Sleep apnea, unspecified: Secondary | ICD-10-CM | POA: Diagnosis present

## 2017-11-10 DIAGNOSIS — N179 Acute kidney failure, unspecified: Secondary | ICD-10-CM | POA: Diagnosis present

## 2017-11-10 DIAGNOSIS — Z96611 Presence of right artificial shoulder joint: Secondary | ICD-10-CM | POA: Diagnosis present

## 2017-11-10 DIAGNOSIS — I25110 Atherosclerotic heart disease of native coronary artery with unstable angina pectoris: Secondary | ICD-10-CM | POA: Diagnosis not present

## 2017-11-10 DIAGNOSIS — N1831 Chronic kidney disease, stage 3a: Secondary | ICD-10-CM | POA: Diagnosis present

## 2017-11-10 DIAGNOSIS — H3530 Unspecified macular degeneration: Secondary | ICD-10-CM | POA: Diagnosis present

## 2017-11-10 DIAGNOSIS — E118 Type 2 diabetes mellitus with unspecified complications: Secondary | ICD-10-CM | POA: Diagnosis not present

## 2017-11-10 DIAGNOSIS — E876 Hypokalemia: Secondary | ICD-10-CM | POA: Diagnosis present

## 2017-11-10 HISTORY — DX: Low back pain, unspecified: M54.50

## 2017-11-10 HISTORY — DX: Old myocardial infarction: I25.2

## 2017-11-10 HISTORY — DX: Unspecified glaucoma: H40.9

## 2017-11-10 HISTORY — DX: Low back pain: M54.5

## 2017-11-10 HISTORY — DX: Other chronic pain: G89.29

## 2017-11-10 HISTORY — PX: CORONARY/GRAFT ACUTE MI REVASCULARIZATION: CATH118305

## 2017-11-10 HISTORY — DX: Personal history of other medical treatment: Z92.89

## 2017-11-10 HISTORY — DX: Cerebral infarction, unspecified: I63.9

## 2017-11-10 HISTORY — DX: Pneumonia, unspecified organism: J18.9

## 2017-11-10 HISTORY — DX: Type 2 diabetes mellitus without complications: E11.9

## 2017-11-10 HISTORY — DX: Chronic kidney disease, stage 3 (moderate): N18.3

## 2017-11-10 HISTORY — DX: Chronic kidney disease, stage 3 unspecified: N18.30

## 2017-11-10 HISTORY — DX: Type 2 diabetes mellitus with diabetic nephropathy: E11.21

## 2017-11-10 HISTORY — PX: LEFT HEART CATH AND CORONARY ANGIOGRAPHY: CATH118249

## 2017-11-10 LAB — CBC WITH DIFFERENTIAL/PLATELET
Abs Immature Granulocytes: 0 10*3/uL (ref 0.0–0.1)
Basophils Absolute: 0 10*3/uL (ref 0.0–0.1)
Basophils Relative: 1 %
Eosinophils Absolute: 0.2 10*3/uL (ref 0.0–0.7)
Eosinophils Relative: 3 %
HCT: 42.9 % (ref 39.0–52.0)
Hemoglobin: 13.8 g/dL (ref 13.0–17.0)
Immature Granulocytes: 0 %
Lymphocytes Relative: 35 %
Lymphs Abs: 1.7 10*3/uL (ref 0.7–4.0)
MCH: 28.4 pg (ref 26.0–34.0)
MCHC: 32.2 g/dL (ref 30.0–36.0)
MCV: 88.3 fL (ref 78.0–100.0)
Monocytes Absolute: 0.5 10*3/uL (ref 0.1–1.0)
Monocytes Relative: 10 %
Neutro Abs: 2.5 10*3/uL (ref 1.7–7.7)
Neutrophils Relative %: 51 %
Platelets: 197 10*3/uL (ref 150–400)
RBC: 4.86 MIL/uL (ref 4.22–5.81)
RDW: 13.2 % (ref 11.5–15.5)
WBC: 4.9 10*3/uL (ref 4.0–10.5)

## 2017-11-10 LAB — COMPREHENSIVE METABOLIC PANEL
ALT: 12 U/L — ABNORMAL LOW (ref 17–63)
AST: 16 U/L (ref 15–41)
Albumin: 3.5 g/dL (ref 3.5–5.0)
Alkaline Phosphatase: 61 U/L (ref 38–126)
Anion gap: 9 (ref 5–15)
BUN: 21 mg/dL — ABNORMAL HIGH (ref 6–20)
CO2: 24 mmol/L (ref 22–32)
Calcium: 8.9 mg/dL (ref 8.9–10.3)
Chloride: 106 mmol/L (ref 101–111)
Creatinine, Ser: 1.55 mg/dL — ABNORMAL HIGH (ref 0.61–1.24)
GFR calc Af Amer: 46 mL/min — ABNORMAL LOW (ref >60)
GFR calc non Af Amer: 39 mL/min — ABNORMAL LOW (ref >60)
Glucose, Bld: 144 mg/dL — ABNORMAL HIGH (ref 65–99)
Potassium: 3.4 mmol/L — ABNORMAL LOW (ref 3.5–5.1)
Sodium: 139 mmol/L (ref 135–145)
Total Bilirubin: 0.8 mg/dL (ref 0.3–1.2)
Total Protein: 6.9 g/dL (ref 6.5–8.1)

## 2017-11-10 LAB — I-STAT CHEM 8, ED
BUN: 22 mg/dL — ABNORMAL HIGH (ref 6–20)
Calcium, Ion: 1.12 mmol/L — ABNORMAL LOW (ref 1.15–1.40)
Chloride: 104 mmol/L (ref 101–111)
Creatinine, Ser: 1.4 mg/dL — ABNORMAL HIGH (ref 0.61–1.24)
Glucose, Bld: 147 mg/dL — ABNORMAL HIGH (ref 65–99)
HCT: 42 % (ref 39.0–52.0)
Hemoglobin: 14.3 g/dL (ref 13.0–17.0)
Potassium: 3.4 mmol/L — ABNORMAL LOW (ref 3.5–5.1)
Sodium: 142 mmol/L (ref 135–145)
TCO2: 22 mmol/L (ref 22–32)

## 2017-11-10 LAB — POCT ACTIVATED CLOTTING TIME
Activated Clotting Time: 560 seconds
Activated Clotting Time: 164 seconds

## 2017-11-10 LAB — GLUCOSE, CAPILLARY
Glucose-Capillary: 156 mg/dL — ABNORMAL HIGH (ref 65–99)
Glucose-Capillary: 155 mg/dL — ABNORMAL HIGH (ref 65–99)

## 2017-11-10 LAB — MRSA PCR SCREENING: MRSA by PCR: NEGATIVE

## 2017-11-10 LAB — TROPONIN I: Troponin I: 51.42 ng/mL (ref <0.03)

## 2017-11-10 LAB — POCT I-STAT TROPONIN I: Troponin i, poc: 0.03 ng/mL (ref 0.00–0.08)

## 2017-11-10 SURGERY — CORONARY/GRAFT ACUTE MI REVASCULARIZATION
Anesthesia: LOCAL

## 2017-11-10 MED ORDER — HEPARIN (PORCINE) IN NACL 1000-0.9 UT/500ML-% IV SOLN
INTRAVENOUS | Status: AC
  Filled 2017-11-10: qty 500

## 2017-11-10 MED ORDER — BIVALIRUDIN BOLUS VIA INFUSION - CUPID
INTRAVENOUS | Status: DC | PRN
  Administered 2017-11-10: 78.75 mg via INTRAVENOUS

## 2017-11-10 MED ORDER — HEPARIN (PORCINE) IN NACL 2-0.9 UNITS/ML
INTRAMUSCULAR | Status: AC | PRN
  Administered 2017-11-10 (×3): 500 mL

## 2017-11-10 MED ORDER — SODIUM CHLORIDE 0.9 % IV BOLUS
500.0000 mL | Freq: Once | INTRAVENOUS | Status: AC
  Administered 2017-11-10: 500 mL via INTRAVENOUS

## 2017-11-10 MED ORDER — HEPARIN SODIUM (PORCINE) 5000 UNIT/ML IJ SOLN
5000.0000 [IU] | Freq: Three times a day (TID) | INTRAMUSCULAR | Status: DC
  Administered 2017-11-10 – 2017-11-18 (×19): 5000 [IU] via SUBCUTANEOUS
  Filled 2017-11-10 (×19): qty 1

## 2017-11-10 MED ORDER — DORZOLAMIDE HCL 2 % OP SOLN: 1.0000 [drp] | Freq: Two times a day (BID) | OPHTHALMIC | Status: DC

## 2017-11-10 MED ORDER — SODIUM CHLORIDE 0.9 % IV SOLN: 250.0000 mL | INTRAVENOUS | Status: DC | PRN

## 2017-11-10 MED ORDER — ORAL CARE MOUTH RINSE
15.0000 mL | Freq: Two times a day (BID) | OROMUCOSAL | Status: DC
  Administered 2017-11-12 – 2017-11-18 (×10): 15 mL via OROMUCOSAL

## 2017-11-10 MED ORDER — LIDOCAINE HCL (PF) 1 % IJ SOLN
INTRAMUSCULAR | Status: DC | PRN
  Administered 2017-11-10: 2 mL

## 2017-11-10 MED ORDER — IOHEXOL 350 MG/ML SOLN
INTRAVENOUS | Status: DC | PRN
  Administered 2017-11-10: 310 mL via INTRACARDIAC

## 2017-11-10 MED ORDER — TICAGRELOR 90 MG PO TABS
ORAL_TABLET | ORAL | Status: AC
  Filled 2017-11-10: qty 2

## 2017-11-10 MED ORDER — HEPARIN SODIUM (PORCINE) 1000 UNIT/ML IJ SOLN: 4000.0000 [IU] | Freq: Once | INTRAMUSCULAR | Status: DC

## 2017-11-10 MED ORDER — ATORVASTATIN CALCIUM 80 MG PO TABS
80.0000 mg | ORAL_TABLET | Freq: Every day | ORAL | Status: DC
  Administered 2017-11-10 – 2017-11-11 (×2): 80 mg via ORAL
  Filled 2017-11-10 (×2): qty 1

## 2017-11-10 MED ORDER — ATORVASTATIN CALCIUM 80 MG PO TABS: 80.0000 mg | ORAL_TABLET | Freq: Every day | ORAL | Status: DC

## 2017-11-10 MED ORDER — DIAZEPAM 5 MG PO TABS
5.0000 mg | ORAL_TABLET | Freq: Four times a day (QID) | ORAL | Status: DC | PRN
  Administered 2017-11-10: 5 mg via ORAL
  Filled 2017-11-10: qty 1

## 2017-11-10 MED ORDER — MIDAZOLAM HCL 2 MG/2ML IJ SOLN
INTRAMUSCULAR | Status: DC | PRN
  Administered 2017-11-10 (×3): 1 mg via INTRAVENOUS

## 2017-11-10 MED ORDER — ONDANSETRON HCL 4 MG/2ML IJ SOLN: 4.0000 mg | Freq: Four times a day (QID) | INTRAMUSCULAR | Status: DC | PRN

## 2017-11-10 MED ORDER — GABAPENTIN 800 MG PO TABS: 800.0000 mg | ORAL_TABLET | Freq: Three times a day (TID) | ORAL | Status: DC

## 2017-11-10 MED ORDER — POLYETHYL GLYCOL-PROPYL GLYCOL 0.4-0.3 % OP GEL: Freq: Three times a day (TID) | OPHTHALMIC | Status: DC

## 2017-11-10 MED ORDER — SODIUM CHLORIDE 0.9 % IV SOLN
INTRAVENOUS | Status: AC | PRN
  Administered 2017-11-10: 50 mL/h via INTRAVENOUS

## 2017-11-10 MED ORDER — SODIUM CHLORIDE 0.9 % IV SOLN
INTRAVENOUS | Status: DC
  Administered 2017-11-10 – 2017-11-11 (×2): via INTRAVENOUS

## 2017-11-10 MED ORDER — TICAGRELOR 90 MG PO TABS
90.0000 mg | ORAL_TABLET | Freq: Two times a day (BID) | ORAL | Status: DC
  Administered 2017-11-10 – 2017-11-12 (×4): 90 mg via ORAL
  Filled 2017-11-10 (×4): qty 1

## 2017-11-10 MED ORDER — LABETALOL HCL 5 MG/ML IV SOLN: 10.0000 mg | INTRAVENOUS | Status: AC | PRN

## 2017-11-10 MED ORDER — BIVALIRUDIN TRIFLUOROACETATE 250 MG IV SOLR
INTRAVENOUS | Status: AC
  Filled 2017-11-10: qty 250

## 2017-11-10 MED ORDER — LIDOCAINE HCL (PF) 1 % IJ SOLN
INTRAMUSCULAR | Status: AC
  Filled 2017-11-10: qty 30

## 2017-11-10 MED ORDER — FENTANYL CITRATE (PF) 100 MCG/2ML IJ SOLN
INTRAMUSCULAR | Status: AC
  Filled 2017-11-10: qty 2

## 2017-11-10 MED ORDER — VERAPAMIL HCL 2.5 MG/ML IV SOLN
INTRAVENOUS | Status: AC
  Filled 2017-11-10: qty 2

## 2017-11-10 MED ORDER — ACETAMINOPHEN 325 MG PO TABS: 650.0000 mg | ORAL_TABLET | ORAL | Status: DC | PRN

## 2017-11-10 MED ORDER — FLUOROMETHOLONE 0.1 % OP SUSP: 1.0000 [drp] | Freq: Two times a day (BID) | OPHTHALMIC | Status: DC

## 2017-11-10 MED ORDER — MIDAZOLAM HCL 2 MG/2ML IJ SOLN
INTRAMUSCULAR | Status: AC
  Filled 2017-11-10: qty 2

## 2017-11-10 MED ORDER — NITROGLYCERIN 1 MG/10 ML FOR IR/CATH LAB
INTRA_ARTERIAL | Status: AC
  Filled 2017-11-10: qty 10

## 2017-11-10 MED ORDER — SODIUM CHLORIDE 0.9% FLUSH: 3.0000 mL | INTRAVENOUS | Status: DC | PRN

## 2017-11-10 MED ORDER — HEPARIN SODIUM (PORCINE) 5000 UNIT/ML IJ SOLN
INTRAMUSCULAR | Status: AC
  Administered 2017-11-10: 4000 [IU]
  Filled 2017-11-10: qty 1

## 2017-11-10 MED ORDER — FENTANYL CITRATE (PF) 100 MCG/2ML IJ SOLN
INTRAMUSCULAR | Status: DC | PRN
  Administered 2017-11-10 (×4): 25 ug via INTRAVENOUS

## 2017-11-10 MED ORDER — LATANOPROST 0.005 % OP SOLN
1.0000 [drp] | Freq: Every day | OPHTHALMIC | Status: DC
Start: 2017-11-10 — End: 2017-11-18
  Administered 2017-11-10 – 2017-11-17 (×6): 1 [drp] via OPHTHALMIC
  Filled 2017-11-10 (×4): qty 2.5

## 2017-11-10 MED ORDER — TAMSULOSIN HCL 0.4 MG PO CAPS
0.4000 mg | ORAL_CAPSULE | Freq: Every day | ORAL | Status: DC
  Administered 2017-11-11 – 2017-11-13 (×3): 0.4 mg via ORAL
  Filled 2017-11-10 (×4): qty 1

## 2017-11-10 MED ORDER — INSULIN ASPART 100 UNIT/ML ~~LOC~~ SOLN
0.0000 [IU] | Freq: Three times a day (TID) | SUBCUTANEOUS | Status: DC
  Administered 2017-11-10 – 2017-11-12 (×3): 3 [IU] via SUBCUTANEOUS
  Administered 2017-11-13: 2 [IU] via SUBCUTANEOUS

## 2017-11-10 MED ORDER — FENTANYL CITRATE (PF) 100 MCG/2ML IJ SOLN
50.0000 ug | Freq: Once | INTRAMUSCULAR | Status: AC
  Administered 2017-11-10: 50 ug via INTRAVENOUS

## 2017-11-10 MED ORDER — ATROPINE SULFATE 1 MG/10ML IJ SOSY
PREFILLED_SYRINGE | INTRAMUSCULAR | Status: AC
  Filled 2017-11-10: qty 10

## 2017-11-10 MED ORDER — TICAGRELOR 90 MG PO TABS
ORAL_TABLET | ORAL | Status: DC | PRN
  Administered 2017-11-10: 180 mg via ORAL

## 2017-11-10 MED ORDER — NITROGLYCERIN 0.4 MG SL SUBL: 0.4000 mg | SUBLINGUAL_TABLET | SUBLINGUAL | Status: DC | PRN

## 2017-11-10 MED ORDER — VERAPAMIL HCL 2.5 MG/ML IV SOLN
INTRAVENOUS | Status: DC | PRN
  Administered 2017-11-10: 12:00:00 via INTRA_ARTERIAL

## 2017-11-10 MED ORDER — SODIUM CHLORIDE 0.9 % IV SOLN
INTRAVENOUS | Status: AC | PRN
  Administered 2017-11-10 (×2): 1.75 mg/kg/h via INTRAVENOUS

## 2017-11-10 MED ORDER — ALLOPURINOL 300 MG PO TABS
300.0000 mg | ORAL_TABLET | Freq: Every day | ORAL | Status: DC
  Administered 2017-11-11 – 2017-11-13 (×3): 300 mg via ORAL
  Filled 2017-11-10 (×4): qty 1

## 2017-11-10 MED ORDER — ACETAMINOPHEN 325 MG PO TABS
650.0000 mg | ORAL_TABLET | ORAL | Status: DC | PRN
  Administered 2017-11-10: 650 mg via ORAL
  Filled 2017-11-10: qty 2

## 2017-11-10 MED ORDER — NITROGLYCERIN 1 MG/10 ML FOR IR/CATH LAB
INTRA_ARTERIAL | Status: DC | PRN
  Administered 2017-11-10: 100 ug via INTRACORONARY
  Administered 2017-11-10: 200 ug via INTRACORONARY
  Administered 2017-11-10 (×2): 100 ug via INTRACORONARY

## 2017-11-10 MED ORDER — SODIUM CHLORIDE 0.9% FLUSH
3.0000 mL | Freq: Two times a day (BID) | INTRAVENOUS | Status: DC
  Administered 2017-11-10 (×2): 3 mL via INTRAVENOUS

## 2017-11-10 MED ORDER — ASPIRIN 81 MG PO CHEW: 81.0000 mg | CHEWABLE_TABLET | Freq: Every day | ORAL | Status: DC

## 2017-11-10 MED ORDER — SODIUM CHLORIDE 0.9% FLUSH: 3.0000 mL | Freq: Two times a day (BID) | INTRAVENOUS | Status: DC

## 2017-11-10 MED ORDER — ASPIRIN EC 81 MG PO TBEC
81.0000 mg | DELAYED_RELEASE_TABLET | Freq: Every day | ORAL | Status: DC
  Administered 2017-11-11 – 2017-11-12 (×2): 81 mg via ORAL
  Filled 2017-11-10 (×2): qty 1

## 2017-11-10 MED ORDER — HYDRALAZINE HCL 20 MG/ML IJ SOLN: 5.0000 mg | INTRAMUSCULAR | Status: AC | PRN

## 2017-11-10 SURGICAL SUPPLY — 28 items
BALLN SAPPHIRE 2.0X12 (BALLOONS) ×2
BALLN SAPPHIRE 2.5X15 (BALLOONS) ×2
BALLN SAPPHIRE ~~LOC~~ 3.0X15 (BALLOONS) ×1 IMPLANT
BALLN SAPPHIRE ~~LOC~~ 3.5X15 (BALLOONS) IMPLANT
BALLOON SAPPHIRE 2.0X12 (BALLOONS) IMPLANT
BALLOON SAPPHIRE 2.5X15 (BALLOONS) IMPLANT
CATH INFINITI 5FR JL5 (CATHETERS) ×1 IMPLANT
CATH INFINITI 5FR MULTPACK ANG (CATHETERS) ×1 IMPLANT
CATH LAUNCHER 6FR JL5 (CATHETERS) ×1 IMPLANT
CATH OPTITORQUE TIG 4.0 5F (CATHETERS) ×1 IMPLANT
DEVICE RAD COMP TR BAND LRG (VASCULAR PRODUCTS) ×1 IMPLANT
GUIDEWIRE INQWIRE 1.5J.035X260 (WIRE) IMPLANT
INQWIRE 1.5J .035X260CM (WIRE) ×2
KIT ENCORE 26 ADVANTAGE (KITS) ×2 IMPLANT
KIT HEART LEFT (KITS) ×2 IMPLANT
NDL PERC 21GX4CM (NEEDLE) IMPLANT
NEEDLE PERC 21GX4CM (NEEDLE) ×2 IMPLANT
PACK CARDIAC CATHETERIZATION (CUSTOM PROCEDURE TRAY) ×2 IMPLANT
SHEATH PINNACLE 6F 10CM (SHEATH) ×1 IMPLANT
SHEATH RAIN RADIAL 21G 6FR (SHEATH) ×1 IMPLANT
STENT RESOLUTE ONYX 2.0X12 (Permanent Stent) ×1 IMPLANT
STENT RESOLUTE ONYX 2.5X18 (Permanent Stent) ×1 IMPLANT
STENT RESOLUTE ONYX 3.0X18 (Permanent Stent) ×1 IMPLANT
TRANSDUCER W/STOPCOCK (MISCELLANEOUS) ×2 IMPLANT
TUBING CIL FLEX 10 FLL-RA (TUBING) ×2 IMPLANT
WIRE EMERALD 3MM-J .035X150CM (WIRE) ×1 IMPLANT
WIRE HI TORQ VERSACORE-J 145CM (WIRE) ×1 IMPLANT
WIRE PT2 MS 185 (WIRE) ×1 IMPLANT

## 2017-11-10 NOTE — ED Notes (Signed)
Pt to ER by [facility] from home where he lives with son with chief complaint of chest pain onset this morning at 8 am, elevation in II, III, and aVF. Pt appears pale and clammy on arrival. BP 88/42 manual. Received 324 mg aspirin and 1 nitro in route.

## 2017-11-10 NOTE — Progress Notes (Signed)
CRITICAL VALUE ALERT  Critical Value:  Troponin 51.42 Date & Time Notied:  11/10/2017 1640  Provider Notified: Ward Givens NP text paged Orders Received/Actions taken: pt post cath/stemi value expected

## 2017-11-10 NOTE — Progress Notes (Signed)
Right sheath pulled at 1651 by Josh RN. pressure held for 20 mins. Pt tolerated well, vital signs stable. Dressing applied to site. Pt instructed not to bend leg and to call for assistance. Will continue to monitor

## 2017-11-10 NOTE — ED Provider Notes (Signed)
[facility] EMERGENCY DEPARTMENT Provider Note   CSN: [id number] Arrival date & time: 11/10/17  1054     History   Chief Complaint Chief Complaint  Patient presents with  . Code STEMI    HPI [name] is a 84 y.o. male.  The history is provided by the patient and the EMS personnel. No language interpreter was used.    [name] is a 84 y.o. male who presents to the Emergency Department complaining of chest pain. At 8 AM he developed severe, left sided chest pain. Pain is constant in nature and nonradiating. He has associated diaphoresis. EMS activated STEMI prior to ED arrival. He received aspirin 324 as well as one sublingual nitroglycerin. He reports no significant improvement in his pain. He does endorse several days of generalized weakness and feeling unwell. No fevers, vomiting, abdominal pain, diarrhea. Symptoms are severe and constant nature.  Past Medical History:  Diagnosis Date  . Arthritis    All Joints  . Chronic back pain   . CKD (chronic kidney disease), stage III (HCC)   . Constipation    takes an OTC med as needed  . Diabetes mellitus    takes Metformin daily  . Diabetic nephropathy (HCC)   . Diastolic dysfunction    a. 01/2015 Echo: EF 60-65%, no rwma, Gr1 DD, mildly dil LA, PaSP .  . Gastric ulcer    50 Years ago  . Glaucoma    uses eye drops daily  . Gout    takes Allopurinol daily  . History of colon polyps   . History of stress test    a. 09/2014 MV: EF 53%, no ischemia/infarct.  . Hyperlipidemia    takes Pravastatin daily  . Hypertension    takes Amlodipine and Losartan daily  . Insomnia    uses OTC sleep aide  . Joint pain   . Joint swelling    right knee   . Legally blind   . Macular degeneration   . Morbid obesity (HCC)   . Nocturia   . Peripheral edema   . Pleurisy 20+yrs ago  . Sleep apnea   . Syncope    a. 09/2014 in setting of orthostasis; b. 10/2014 Event monitor: 19 beat run of asymp NSVT;  c. 01/2015 Echo: EF 60-65%, no rwma.  . Urinary frequency    takes Flomax daily    Patient Active Problem List   Diagnosis Date Noted  . STEMI involving left circumflex coronary artery (HCC) 11/10/2017  . Acute ST elevation myocardial infarction (STEMI) of inferolateral wall (HCC) 11/10/2017  . ST elevation myocardial infarction involving left circumflex coronary artery (HCC)   . AKI (acute kidney injury) (HCC) 01/22/2015  . Lactic acidosis 01/22/2015  . Syncope and collapse   . Type 2 diabetes mellitus without complication (HCC)   . HTN (hypertension), benign   . Encephalopathy 09/07/2014  . Osteoarthrosis, unspecified whether generalized or localized, shoulder region 09/21/2013  . Gout 12/23/2012  . Hypotension 12/21/2012  . Diabetes mellitus (HCC) 12/21/2012  . Hyperlipidemia   . Morbid obesity (HCC)     Past Surgical History:  Procedure Laterality Date  . BACK SURGERY    . CATARACT EXTRACTION W/ INTRAOCULAR LENS  IMPLANT, BILATERAL Bilateral   . COLONOSCOPY    . GLAUCOMA SURGERY Bilateral   . KNEE ARTHROSCOPY Right    1990'  . MINI SHUNT INSERTION  09/26/2011   Procedure: INSERTION OF MINI SHUNT;  Surgeon: Chalmers Guest, MD;  Location:  [facility] OR;  Service: Ophthalmology;  Laterality: Right;  Insertion of Ahmed shunt  . neurostimulator placed  about 54yrs ago  . REVERSE SHOULDER ARTHROPLASTY Right 09/21/2013   Procedure: RIGHT  SHOULDER ARTHROPLASTY REVERSE ;  Surgeon: [name], MD;  Location: [facility] OR;  Service: Orthopedics;  Laterality: Right;  . SHOULDER INJECTION Left 09/21/2013   Procedure: SHOULDER INJECTION;  Surgeon: [name], MD;  Location[facility] OR;  Service: Orthopedics;  Laterality: Left;  . SHOULDER SURGERY Right 09/22/2013   REVERSAL    DR NORRIS        Home Medications    Prior to Admission medications   Medication Sig Start Date End Date Taking? Authorizing Provider  allopurinol (ZYLOPRIM) 300 MG tablet Take 300 mg by mouth daily.   Yes [provider]  furosemide (LASIX) 20 MG tablet Take 20 mg by mouth daily. 11/04/17  Yes [provider]  loperamide (IMODIUM) 2 MG capsule Take 2 mg by mouth as needed for diarrhea or loose stools.   Yes [provider]  losartan (COZAAR) 50 MG tablet Take 50 mg by mouth daily.   Yes [provider]  metFORMIN (GLUCOPHAGE) 1000 MG tablet Take 2,000 mg by mouth at bedtime.    Yes [provider]  pravastatin (PRAVACHOL) 40 MG tablet Take 40 mg by mouth daily.   Yes [provider]  prednisoLONE acetate (PRED FORTE) 1 % ophthalmic suspension Place 1 drop into the right eye at bedtime.   Yes [provider]  sertraline (ZOLOFT) 50 MG tablet Take 50 mg by mouth at bedtime.   Yes [provider]  Tamsulosin HCl (FLOMAX) 0.4 MG CAPS Take 0.4 mg by mouth daily.   Yes [provider]  timolol (TIMOPTIC) 0.5 % ophthalmic solution Place 1 drop into the right eye daily.   Yes [provider]  TRAVATAN Z 0.004 % SOLN ophthalmic solution Place 1 drop into the left eye at bedtime.  08/19/14  Yes [provider]    Family History Family History  Problem Relation Age of Onset  . Anesthesia problems Neg Hx     Social History Social History   Tobacco Use  . Smoking status: Former Smoker    Packs/day: 2.00    Types: Cigarettes  . Smokeless tobacco: Never Used  . Tobacco comment: quit smoking 7yrs ago  Substance Use Topics  . Alcohol use: Yes    Comment: rarely  . Drug use: No     Allergies   Patient has no known allergies.   Review of Systems Review of Systems  All other systems reviewed and are negative.    Physical Exam Updated Vital Signs BP (!) 141/93   Pulse 65   Temp (!) 97.5 F (36.4 C) (Oral)   Resp 17   Ht 5\' 10"  (1.778 m)   Wt 107.7 kg (237 lb 7 oz)   SpO2 99%   BMI 34.07 kg/m   Physical Exam  Constitutional: He is oriented to person, place, and time. He appears well-developed  and well-nourished.  HENT:  Head: Normocephalic and atraumatic.  Eyes:  Conjunctival exudates bilaterally. Eyes closed.  Cardiovascular: Normal rate and regular rhythm.  No murmur heard. Pulmonary/Chest: Effort normal and breath sounds normal. No respiratory distress.  Abdominal: Soft. There is no tenderness. There is no rebound and no guarding.  Musculoskeletal: He exhibits no tenderness.  Trace edema to bilateral lower extremities. Thready radial and femoral pulses bilaterally.  Neurological: He is alert  and oriented to person, place, and time.  Five out of five strength in all four extremities  Skin: Skin is dry.  Cool skin  Psychiatric: He has a normal mood and affect. His behavior is normal.  Nursing note and vitals reviewed.    ED Treatments / Results  Labs (all labs ordered are listed, but only abnormal results are displayed) Labs Reviewed  COMPREHENSIVE METABOLIC PANEL - Abnormal; Notable for the following components:      Result Value   Potassium 3.4 (*)    Glucose, Bld 144 (*)    BUN 21 (*)    Creatinine, Ser 1.55 (*)    ALT 12 (*)    GFR calc non Af Amer 39 (*)    GFR calc Af Amer 46 (*)    All other components within normal limits  TROPONIN I - Abnormal; Notable for the following components:   Troponin I 51.42 (*)    All other components within normal limits  GLUCOSE, CAPILLARY - Abnormal; Notable for the following components:   Glucose-Capillary 156 (*)    All other components within normal limits  I-STAT CHEM 8, ED - Abnormal; Notable for the following components:   Potassium 3.4 (*)    BUN 22 (*)    Creatinine, Ser 1.40 (*)    Glucose, Bld 147 (*)    Calcium, Ion 1.12 (*)    All other components within normal limits  MRSA PCR SCREENING  CBC WITH DIFFERENTIAL/PLATELET  TROPONIN I  TROPONIN I  LIPID PANEL  BASIC METABOLIC PANEL  CBC  HEPATIC FUNCTION PANEL  I-STAT TROPONIN, ED  POCT I-STAT TROPONIN I  POCT ACTIVATED CLOTTING TIME  POCT ACTIVATED  CLOTTING TIME    EKG EKG Interpretation  Date/Time:  Sunday November 10 2017 10:58:05 EDT Ventricular Rate:  67 PR Interval:    QRS Duration: 145 QT Interval:  465 QTC Calculation: 491 R Axis:   -73 Text Interpretation:  Sinus rhythm Prolonged PR interval RBBB and LAFB Inferior infarct, acute (LCx) Lateral leads are also involved ** ** ACUTE MI / STEMI ** ** Confirmed by Tilden Fossa [phone]) on 11/10/2017 11:36:31 AM   Radiology Dg Chest Port 1 View  Result Date: 11/10/2017 CLINICAL DATA:  Chest pain EXAM: PORTABLE CHEST 1 VIEW COMPARISON:  09/07/2014 chest radiograph. FINDINGS: Stimulator leads overlie the lower thoracic spine. Stable cardiomediastinal silhouette with top-normal heart size. No pneumothorax. No pleural effusion. Hypo inspiratory radiograph. No pulmonary edema. No acute consolidative airspace disease. IMPRESSION: Hypo inspiratory radiograph with no active cardiopulmonary disease. Electronically Signed   By: Delbert Phenix M.D.   On: 11/10/2017 11:17    Procedures Procedures (including critical care time) CRITICAL CARE Performed by: Tilden Fossa   Total critical care time: 30 minutes  Critical care time was exclusive of separately billable procedures and treating other patients.  Critical care was necessary to Duffy or prevent imminent or life-threatening deterioration.  Critical care was time spent personally by me on the following activities: development of treatment plan with patient and/or surrogate as well as nursing, discussions with consultants, evaluation of patient's response to treatment, examination of patient, obtaining history from patient or surrogate, ordering and performing treatments and interventions, ordering and review of laboratory studies, ordering and review of radiographic studies, pulse oximetry and re-evaluation of patient's condition.  Medications Ordered in ED Medications  fentaNYL (SUBLIMAZE) 100 MCG/2ML injection (has no administration in  time range)  allopurinol (ZYLOPRIM) tablet 300 mg (has no administration in time range)  dorzolamide (  TRUSOPT) 2 % ophthalmic solution 1 drop (has no administration in time range)  fluorometholone (FML) 0.1 % ophthalmic suspension 1 drop (has no administration in time range)  gabapentin (NEURONTIN) tablet 800 mg (has no administration in time range)  polyethylene glycol 0.4% and propylene glycol 0.3% (SYSTANE) ophthalmic gel (has no administration in time range)  tamsulosin (FLOMAX) capsule 0.4 mg (has no administration in time range)  latanoprost (XALATAN) 0.005 % ophthalmic solution 1 drop (has no administration in time range)  aspirin EC tablet 81 mg (81 mg Oral Not Given 11/10/17 1634)  nitroGLYCERIN (NITROSTAT) SL tablet 0.4 mg (has no administration in time range)  acetaminophen (TYLENOL) tablet 650 mg (has no administration in time range)  ondansetron (ZOFRAN) injection 4 mg (has no administration in time range)  heparin injection 5,000 Units (has no administration in time range)  sodium chloride flush (NS) 0.9 % injection 3 mL (3 mLs Intravenous Given 11/10/17 1636)  sodium chloride flush (NS) 0.9 % injection 3 mL (has no administration in time range)  0.9 %  sodium chloride infusion (has no administration in time range)  atorvastatin (LIPITOR) tablet 80 mg (80 mg Oral Given 11/10/17 1806)  insulin aspart (novoLOG) injection 0-15 Units (3 Units Subcutaneous Given 11/10/17 1636)  labetalol (NORMODYNE,TRANDATE) injection 10 mg (has no administration in time range)  hydrALAZINE (APRESOLINE) injection 5 mg (has no administration in time range)  0.9 %  sodium chloride infusion ( Intravenous New Bag/Given 11/10/17 1416)  sodium chloride flush (NS) 0.9 % injection 3 mL (0 mLs Intravenous Duplicate 11/10/17 1732)  sodium chloride flush (NS) 0.9 % injection 3 mL (has no administration in time range)  0.9 %  sodium chloride infusion (has no administration in time range)  diazepam (VALIUM) tablet 5 mg (has  no administration in time range)  ticagrelor (BRILINTA) tablet 90 mg (has no administration in time range)  MEDLINE mouth rinse (has no administration in time range)  sodium chloride 0.9 % bolus 500 mL (500 mLs Intravenous New Bag/Given 11/10/17 1108)  heparin 5000 UNIT/ML injection (4,000 Units  Given 11/10/17 1108)  fentaNYL (SUBLIMAZE) injection 50 mcg (50 mcg Intravenous Given 11/10/17 1110)  heparin infusion 2 units/mL in 0.9 % sodium chloride (500 mLs Other New Bag/Given 11/10/17 1204)  0.9 %  sodium chloride infusion (200 mL/hr Intravenous Rate/Dose Change 11/10/17 1213)  bivalirudin (ANGIOMAX) 250 mg in sodium chloride 0.9 % 50 mL (5 mg/mL) infusion (0 mg/kg/hr  105 kg (Order-Specific)  Stopped 11/10/17 1400)     Initial Impression / Assessment and Plan / ED Course  I have reviewed the triage vital signs and the nursing notes.  Pertinent labs & imaging results that were available during my care of the patient were reviewed by me and considered in my medical decision making (see chart for details).     Presents the emergency department as a semi-alert with severe chest pain. Patient in distress on ED arrival with hypotension. He was treated with heparin, fluid bolus. He did receive aspirin and nitro prior to ED arrival. He was treated with additional dental for his pain. He was transferred emergently to the Cath Lab for further treatment for STEMI.  Final Clinical Impressions(s) / ED Diagnoses   Final diagnoses:  ST elevation myocardial infarction (STEMI), unspecified artery [facility])    ED Discharge Orders    None       Tilden Fossa, MD 11/10/17 1820

## 2017-11-10 NOTE — H&P (Addendum)
History & Physical    Patient ID: [name] MRN: [id number], DOB/AGE: [birth year]   Admit date: 11/10/2017   Primary Physician: Clayborn Heron, MD Primary Cardiologist: Thurmon Fair, MD  Patient Profile    84 y/o ? with a h/o HTN, DM, HL, diast dysfxn, syncope in the setting of orthostasis (09/2014), CKD III, and diabetic nephropathy, who presented today with acute inferolateral STEMI.  Past Medical History    Past Medical History:  Diagnosis Date  . Arthritis    All Joints  . Chronic back pain   . CKD (chronic kidney disease), stage III (HCC)   . Constipation    takes an OTC med as needed  . Diabetes mellitus    takes Metformin daily  . Diabetic nephropathy (HCC)   . Diastolic dysfunction    a. 01/2015 Echo: EF 60-65%, no rwma, Gr1 DD, mildly dil LA, PaSP .  . Gastric ulcer    50 Years ago  . Glaucoma    uses eye drops daily  . Gout    takes Allopurinol daily  . History of colon polyps   . History of stress test    a. 09/2014 MV: EF 53%, no ischemia/infarct.  . Hyperlipidemia    takes Pravastatin daily  . Hypertension    takes Amlodipine and Losartan daily  . Insomnia    uses OTC sleep aide  . Joint pain   . Joint swelling    right knee   . Legally blind   . Macular degeneration   . Morbid obesity (HCC)   . Nocturia   . Peripheral edema   . Pleurisy 20+yrs ago  . Sleep apnea   . Syncope    a. 09/2014 in setting of orthostasis; b. 10/2014 Event monitor: 19 beat run of asymp NSVT; c. 01/2015 Echo: EF 60-65%, no rwma.  . Urinary frequency    takes Flomax daily    Past Surgical History:  Procedure Laterality Date  . BACK SURGERY    . CATARACT EXTRACTION W/ INTRAOCULAR LENS  IMPLANT, BILATERAL Bilateral   . COLONOSCOPY    . GLAUCOMA SURGERY Bilateral   . KNEE ARTHROSCOPY Right    1990'  . MINI SHUNT INSERTION  09/26/2011   Procedure: INSERTION OF MINI SHUNT;  Surgeon: Chalmers Guest, MD;  Location: [facility] OR;  Service: Ophthalmology;   Laterality: Right;  Insertion of Ahmed shunt  . neurostimulator placed  about 3yrs ago  . REVERSE SHOULDER ARTHROPLASTY Right 09/21/2013   Procedure: RIGHT  SHOULDER ARTHROPLASTY REVERSE ;  Surgeon: [name], MD;  Location: [facility] OR;  Service: Orthopedics;  Laterality: Right;  . SHOULDER INJECTION Left 09/21/2013   Procedure: SHOULDER INJECTION;  Surgeon: [name], MD;  Location: [facility] OR;  Service: Orthopedics;  Laterality: Left;  . SHOULDER SURGERY Right 09/22/2013   REVERSAL    DR NORRIS     Allergies  No Known Allergies  History of Present Illness    84 y/o ? with a h/o HTN, DM, HL, diast dysfxn, CKD III, and diabetic nephropathy.  He has no prior h/o CAD but was evaluated for syncope in 09/2014.  Echo @ that time showed nl EF.  Stress testing was negative for ischemia.  Subsequent monitoring showed one episode of asymptomatic NSVT.  In light of nl EF and isolated episode, he was medically managed.  He has not been seen in cardiology clinic since 2016.  In the setting of CKD II-III he has been followed closely by nephrology @  [facility].  He was in his usoh until this AM, when he developed chest pain and activated 911.  Upon EMS arrival, he was noted to have inferolateral ST elevation with anterior ST depression.  Code STEMI was activated and pt was taken to the [facility] cath lab for further eval.  Home Medications    Prior to Admission medications   Medication Sig Start Date End Date Taking? Authorizing Provider  allopurinol (ZYLOPRIM) 300 MG tablet Take 300 mg by mouth daily.    [provider]  amLODipine (NORVASC) 5 MG tablet Take 1 tablet (5 mg total) by mouth daily. 12/30/12   Richarda Overlie, MD  augmented betamethasone dipropionate (DIPROLENE-AF) 0.05 % cream Apply 1 application topically daily.  01/03/15   [provider]  Brinzolamide-Brimonidine (SIMBRINZA) 1-0.2 % SUSP Place 1 drop into the left eye 3 (three) times daily.     [provider]  clobetasol  cream (TEMOVATE) 0.05 % Apply 1 application topically 2 (two) times daily as needed (for sores on arm). NOT to face, groin, or underarms 01/04/15   [provider]  colchicine 0.6 MG tablet Take 1 tablet by mouth daily as needed (for acute gout attack).  07/28/14   [provider]  dorzolamide (TRUSOPT) 2 % ophthalmic solution Place 1 drop into the right eye 2 (two) times daily.     [provider]  fluorometholone (FML) 0.1 % ophthalmic suspension Place 1 drop into both eyes 2 (two) times daily.  08/25/14   [provider]  gabapentin (NEURONTIN) 800 MG tablet Take 800 mg by mouth 3 (three) times daily. 11/09/14   [provider]  ketoconazole (NIZORAL) 2 % cream Apply 1 application topically 2 (two) times daily as needed for irritation.     [provider]  metFORMIN (GLUCOPHAGE) 1000 MG tablet Take 1,000 mg by mouth 2 (two) times daily with a meal.    [provider]  oxyCODONE (OXY IR/ROXICODONE) 5 MG immediate release tablet Take 5 mg by mouth every 4 (four) hours as needed for severe pain.    [provider]  Polyethyl Glycol-Propyl Glycol (SYSTANE OP) Place 1 drop into the left eye 3 (three) times daily.    [provider]  pravastatin (PRAVACHOL) 40 MG tablet Take 40 mg by mouth daily.    [provider]  Tamsulosin HCl (FLOMAX) 0.4 MG CAPS Take 0.4 mg by mouth daily.    [provider]  TRAVATAN Z 0.004 % SOLN ophthalmic solution Place 1 drop into both eyes at bedtime.  08/19/14   [provider]  VOLTAREN 1 % GEL Apply 2 g topically daily as needed (for pain). 01/24/15   Marinda Elk, MD    Family History    Family History  Problem Relation Age of Onset  . Anesthesia problems Neg Hx    indicated that his mother is deceased. He indicated that his father is deceased. He indicated that the status of his neg hx is unknown.   Social History    Social History   Socioeconomic  History  . Marital status: Married    Spouse name: Not on file  . Number of children: Not on file  . Years of education: Not on file  . Highest education level: Not on file  Occupational History  . Not on file  Social Needs  . Financial resource strain: Not on file  . Food insecurity:    Worry: Not on file    Inability: Not on  file  . Transportation needs:    Medical: Not on file    Non-medical: Not on file  Tobacco Use  . Smoking status: Former Smoker    Packs/day: 2.00    Types: Cigarettes  . Smokeless tobacco: Never Used  . Tobacco comment: quit smoking 52yrs ago  Substance and Sexual Activity  . Alcohol use: Yes    Comment: rarely  . Drug use: No  . Sexual activity: Not Currently  Lifestyle  . Physical activity:    Days per week: Not on file    Minutes per session: Not on file  . Stress: Not on file  Relationships  . Social connections:    Talks on phone: Not on file    Gets together: Not on file    Attends religious service: Not on file    Active member of club or organization: Not on file    Attends meetings of clubs or organizations: Not on file    Relationship status: Not on file  . Intimate partner violence:    Fear of current or ex partner: Not on file    Emotionally abused: Not on file    Physically abused: Not on file    Forced sexual activity: Not on file  Other Topics Concern  . Not on file  Social History Narrative  . Not on file     Review of Systems    General:  No chills, fever, night sweats or weight changes.  Cardiovascular:  +++ chest pain, +++ dyspnea, chronic mild LE edema, orthopnea, palpitations, paroxysmal nocturnal dyspnea. Dermatological: No rash, lesions/masses Respiratory: No cough, +++ dyspnea Urologic: No hematuria, dysuria Abdominal:   No nausea, vomiting, diarrhea, bright red blood per rectum, melena, or hematemesis Neurologic:  No visual changes, wkns, changes in mental status. All other systems reviewed and are otherwise  negative except as noted above.  Physical Exam    Blood pressure (!) 88/42, pulse 65, temperature (!) 96.4 F (35.8 C), temperature source Temporal, resp. rate 18, SpO2 93 %.  General: Pleasant, C/o chest pain. Psych: Normal affect. Neuro: Alert and oriented X 3. Moves all extremities spontaneously. HEENT: Normal  Neck: Supple without bruits or JVD. Lungs:  Resp regular and unlabored, CTA. Heart: RRR no s3, s4, or murmurs. Abdomen: Soft, non-tender, non-distended, BS + x 4.  Extremities: No clubbing, cyanosis or edema. DP/PT/Radials 2+ and equal bilaterally.  Labs    Troponin [facility] of Care Test) Recent Labs    11/10/17 1109  TROPIPOC 0.03   Lab Results  Component Value Date   WBC 4.9 11/10/2017   HGB 14.3 11/10/2017   HCT 42.0 11/10/2017   MCV 88.3 11/10/2017   PLT 197 11/10/2017    Recent Labs  Lab 11/10/17 1111  NA 142  K 3.4*  CL 104  BUN 22*  CREATININE 1.40*  GLUCOSE 147*   Lab Results  Component Value Date   CHOL 135 12/23/2012   HDL 48 12/23/2012   LDLCALC 77 12/23/2012   TRIG 51 12/23/2012    Radiology Studies    Dg Chest Port 1 View  Result Date: 11/10/2017 CLINICAL DATA:  Chest pain EXAM: PORTABLE CHEST 1 VIEW COMPARISON:  09/07/2014 chest radiograph. FINDINGS: Stimulator leads overlie the lower thoracic spine. Stable cardiomediastinal silhouette with top-normal heart size. No pneumothorax. No pleural effusion. Hypo inspiratory radiograph. No pulmonary edema. No acute consolidative airspace disease. IMPRESSION: Hypo inspiratory radiograph with no active cardiopulmonary disease. Electronically Signed   By: Jannifer Rodney.D.  On: 11/10/2017 11:17    ECG & Cardiac Imaging    RSR, 69, RBBB, inflat ST elevation with ST dep V2, V3.  Assessment & Plan    1.  Acute inferolateral STEMI:  Pt w/o prior h/o CAD presented via EMS this AM with chest pain and inferolateral ST elevation and ant ST dep.  Emergent cath ongoing.  Plan to add asa, high potency  statin,  blocker as tolerated, +- P2Y12i pending cath results/need for PCI.  Eventual cardiac rehab.  Will order echo.  2.  Essential HTN:  Currently hypotensive.  In short term will limit  blocker usage. Hold home dose of amlodipine for now.  3.  HL:  On pravachol @ home.  In setting of ACS, will escalate dosing/change to higher potency statin.  LFTs ok.  F/u lipids in AM.  4.  DMII:  Hold metformin.  Add SSI.  5.  CKD II-III:  Care everywhere reviewed.  Baseline creat 1.16  1.26 since April (1.26 on 6/3). Creat 1.4 today.  Hydrate post-cath.  Follow.  Signed, Nicolasa Ducking, NP 11/10/2017, 11:51 AM   Patient seen and examined. Agree with assessment and plan.  [name] is an 84 year old African-American gentleman who has a history of hypertension, diabetes mellitus, hyperlipidemia, stage III chronic kidney disease with presumed diabetic nephropathy.  He developed new onset chest pressure this morning at home at approximately 8 AM.  His chest pain persisted.  911 was called.  ECG revealed acute inferolateral ST elevation and a code STEMI was activated and transported to [facility].  Upon arrival to the emergency room he was hypotensive treated with initial IV fluids.  He was heparinized.  He was still having 8 out of 10 chest pain.  He now presents for emergent cardiac catheterization with probable need for intervention.  ECG changes are suggestive of left circumflex or large dominant RCA infarct.  The cardiac catheterization procedure was discussed with the patient who consents for this emergent procedure.   Lennette Bihari, MD, [facility] 11/10/2017 2:29 PM

## 2017-11-11 ENCOUNTER — Inpatient Hospital Stay (HOSPITAL_COMMUNITY): Payer: Medicare HMO

## 2017-11-11 ENCOUNTER — Encounter (HOSPITAL_COMMUNITY): Payer: Self-pay | Admitting: Cardiovascular Disease

## 2017-11-11 DIAGNOSIS — I5043 Acute on chronic combined systolic (congestive) and diastolic (congestive) heart failure: Secondary | ICD-10-CM

## 2017-11-11 DIAGNOSIS — I2510 Atherosclerotic heart disease of native coronary artery without angina pectoris: Secondary | ICD-10-CM

## 2017-11-11 DIAGNOSIS — N183 Chronic kidney disease, stage 3 unspecified: Secondary | ICD-10-CM | POA: Diagnosis present

## 2017-11-11 DIAGNOSIS — I2121 ST elevation (STEMI) myocardial infarction involving left circumflex coronary artery: Secondary | ICD-10-CM

## 2017-11-11 DIAGNOSIS — N1831 Chronic kidney disease, stage 3a: Secondary | ICD-10-CM | POA: Diagnosis present

## 2017-11-11 DIAGNOSIS — I2119 ST elevation (STEMI) myocardial infarction involving other coronary artery of inferior wall: Principal | ICD-10-CM

## 2017-11-11 DIAGNOSIS — I5042 Chronic combined systolic (congestive) and diastolic (congestive) heart failure: Secondary | ICD-10-CM | POA: Diagnosis present

## 2017-11-11 HISTORY — DX: Atherosclerotic heart disease of native coronary artery without angina pectoris: I25.10

## 2017-11-11 LAB — ECHOCARDIOGRAM COMPLETE
Height: 70 in
Weight: 3827.19 oz

## 2017-11-11 LAB — LIPID PANEL
Cholesterol: 138 mg/dL (ref 0–200)
HDL: 36 mg/dL — ABNORMAL LOW
LDL Cholesterol: 88 mg/dL (ref 0–99)
Total CHOL/HDL Ratio: 3.8 ratio
Triglycerides: 72 mg/dL
VLDL: 14 mg/dL (ref 0–40)

## 2017-11-11 LAB — HEPATIC FUNCTION PANEL
ALT: 45 U/L (ref 17–63)
AST: 204 U/L — ABNORMAL HIGH (ref 15–41)
Albumin: 3.4 g/dL — ABNORMAL LOW (ref 3.5–5.0)
Alkaline Phosphatase: 58 U/L (ref 38–126)
Bilirubin, Direct: <0.1 mg/dL — ABNORMAL LOW (ref 0.1–0.5)
Total Bilirubin: 0.8 mg/dL (ref 0.3–1.2)
Total Protein: 6.8 g/dL (ref 6.5–8.1)

## 2017-11-11 LAB — CBC
HCT: 40.5 % (ref 39.0–52.0)
Hemoglobin: 12.9 g/dL — ABNORMAL LOW (ref 13.0–17.0)
MCH: 28.8 pg (ref 26.0–34.0)
MCHC: 31.9 g/dL (ref 30.0–36.0)
MCV: 90.4 fL (ref 78.0–100.0)
Platelets: 168 10*3/uL (ref 150–400)
RBC: 4.48 MIL/uL (ref 4.22–5.81)
RDW: 13.4 % (ref 11.5–15.5)
WBC: 6.8 10*3/uL (ref 4.0–10.5)

## 2017-11-11 LAB — BASIC METABOLIC PANEL
Anion gap: 11 (ref 5–15)
BUN: 17 mg/dL (ref 6–20)
CO2: 23 mmol/L (ref 22–32)
Calcium: 8.7 mg/dL — ABNORMAL LOW (ref 8.9–10.3)
Chloride: 104 mmol/L (ref 101–111)
Creatinine, Ser: 1.31 mg/dL — ABNORMAL HIGH (ref 0.61–1.24)
GFR calc Af Amer: 56 mL/min — ABNORMAL LOW (ref >60)
GFR calc non Af Amer: 48 mL/min — ABNORMAL LOW (ref >60)
Glucose, Bld: 121 mg/dL — ABNORMAL HIGH (ref 65–99)
Potassium: 3.9 mmol/L (ref 3.5–5.1)
Sodium: 138 mmol/L (ref 135–145)

## 2017-11-11 LAB — GLUCOSE, CAPILLARY
Glucose-Capillary: 121 mg/dL — ABNORMAL HIGH (ref 65–99)
Glucose-Capillary: 155 mg/dL — ABNORMAL HIGH (ref 65–99)
Glucose-Capillary: 119 mg/dL — ABNORMAL HIGH (ref 65–99)

## 2017-11-11 LAB — TROPONIN I: Troponin I: >65 ng/mL

## 2017-11-11 MED ORDER — SODIUM CHLORIDE 0.9 % IV SOLN: 250.0000 mL | INTRAVENOUS | Status: DC | PRN

## 2017-11-11 MED ORDER — SODIUM CHLORIDE 0.9 % WEIGHT BASED INFUSION
1.0000 mL/kg/h | INTRAVENOUS | Status: DC
  Administered 2017-11-11: 1 mL/kg/h via INTRAVENOUS

## 2017-11-11 MED ORDER — SODIUM CHLORIDE 0.9% FLUSH: 3.0000 mL | INTRAVENOUS | Status: DC | PRN

## 2017-11-11 MED ORDER — SODIUM CHLORIDE 0.9% FLUSH
3.0000 mL | Freq: Two times a day (BID) | INTRAVENOUS | Status: DC
  Administered 2017-11-11 – 2017-11-12 (×2): 3 mL via INTRAVENOUS

## 2017-11-11 MED ORDER — ASPIRIN 81 MG PO CHEW
81.0000 mg | CHEWABLE_TABLET | ORAL | Status: AC
  Administered 2017-11-12: 81 mg via ORAL
  Filled 2017-11-11: qty 1

## 2017-11-11 NOTE — H&P (View-Only) (Signed)
Progress Note  Patient Name: [name] Date of Encounter: 11/11/2017  Primary Cardiologist: Thurmon Fair, MD   Subjective   He is doing relatively well.  He denies chest pain.  No groin or radial access site complaints.  Has not been out of bed yet.  Uses a walker at home because of bad knees and difficulty with falls.  Inpatient Medications    Scheduled Meds: . allopurinol  300 mg Oral Daily  . aspirin EC  81 mg Oral Daily  . atorvastatin  80 mg Oral q1800  . heparin  5,000 Units Subcutaneous Q8H  . insulin aspart  0-15 Units Subcutaneous TID WC  . latanoprost  1 drop Both Eyes QHS  . mouth rinse  15 mL Mouth Rinse BID  . sodium chloride flush  3 mL Intravenous Q12H  . sodium chloride flush  3 mL Intravenous Q12H  . tamsulosin  0.4 mg Oral Daily  . ticagrelor  90 mg Oral BID   Continuous Infusions: . sodium chloride    . sodium chloride 100 mL/hr at 11/11/17 0008  . sodium chloride     PRN Meds: sodium chloride, sodium chloride, acetaminophen, diazepam, nitroGLYCERIN, ondansetron (ZOFRAN) IV, sodium chloride flush, sodium chloride flush   Vital Signs    Vitals:   11/11/17 0400 11/11/17 0500 11/11/17 0600 11/11/17 0700  BP: (!) 160/96 (!) 157/97 (!) 160/94 (!) 154/93  Pulse: 76 76 74 73  Resp: 12 19 13 13   Temp: 98.3 F (36.8 C)     TempSrc: Oral     SpO2: 100% 98% 97% 97%  Weight:      Height:        Intake/Output Summary (Last 24 hours) at 11/11/2017 0810 Last data filed at 11/11/2017 0700 Gross per 24 hour  Intake 2832.37 ml  Output 1675 ml  Net 1157.37 ml   Filed Weights   11/10/17 1400 11/11/17 0357  Weight: 237 lb 7 oz (107.7 kg) 239 lb 3.2 oz (108.5 kg)    Telemetry    Normal sinus rhythm.- Personally Reviewed  ECG    Right bundle, left anterior hemiblock, sinus rhythm, and when compared to the prior tracing from admission, ST elevation has completely resolved.  No Q waves are noted.- Personally Reviewed  Physical Exam  Elderly,  obese African-American male in no respiratory distress. GEN: No acute distress.   Neck: Unable to assess JVD due to obesity and full beard. Cardiac: RRR, no murmurs, rub.  S4 gallop is heard.  Right radial and right femoral access sites unremarkable. Respiratory: Clear to auscultation bilaterally. GI: Soft, nontender, non-distended  MS: No edema; No deformity. Neuro:  Nonfocal  Psych: Normal affect   Labs    Chemistry Recent Labs  Lab 11/10/17 1100 11/10/17 1111 11/11/17 0217  NA 139 142 138  K 3.4* 3.4* 3.9  CL 106 104 104  CO2 24  --  23  GLUCOSE 144* 147* 121*  BUN 21* 22* 17  CREATININE 1.55* 1.40* 1.31*  CALCIUM 8.9  --  8.7*  PROT 6.9  --  6.8  ALBUMIN 3.5  --  3.4*  AST 16  --  204*  ALT 12*  --  45  ALKPHOS 61  --  58  BILITOT 0.8  --  0.8  GFRNONAA 39*  --  48*  GFRAA 46*  --  56*  ANIONGAP 9  --  11     Hematology Recent Labs  Lab 11/10/17 1100 11/10/17 1111 11/11/17 0217  WBC 4.9  --  6.8  RBC 4.86  --  4.48  HGB 13.8 14.3 12.9*  HCT 42.9 42.0 40.5  MCV 88.3  --  90.4  MCH 28.4  --  28.8  MCHC 32.2  --  31.9  RDW 13.2  --  13.4  PLT 197  --  168    Cardiac Enzymes Recent Labs  Lab 11/10/17 1500 11/11/17 0217  TROPONINI 51.42* >65.00*    Recent Labs  Lab 11/10/17 1109  TROPIPOC 0.03     BNPNo results for input(s): BNP, PROBNP in the last 168 hours.   DDimer No results for input(s): DDIMER in the last 168 hours.   Radiology    Dg Chest Port 1 View  Result Date: 11/10/2017 CLINICAL DATA:  Chest pain EXAM: PORTABLE CHEST 1 VIEW COMPARISON:  09/07/2014 chest radiograph. FINDINGS: Stimulator leads overlie the lower thoracic spine. Stable cardiomediastinal silhouette with top-normal heart size. No pneumothorax. No pleural effusion. Hypo inspiratory radiograph. No pulmonary edema. No acute consolidative airspace disease. IMPRESSION: Hypo inspiratory radiograph with no active cardiopulmonary disease. Electronically Signed   By: Delbert Phenix  M.D.   On: 11/10/2017 11:17    Cardiac Studies   Coronary angiography 11/11/2017: Coronary Diagrams   Diagnostic Diagram       Post-Intervention Diagram          Patient Profile     84 y.o. male history of hypertension, type 2 diabetes mellitus, hyperlipidemia, diastolic dysfunction, prior history of syncope, orthostatic hypotension, CKD stage III secondary to diabetes and hypertension who presented with acute inferolateral MI on 11/10/2017 due to total occlusion of the circumflex in the setting of multivessel CAD.  Complex interventional circumflex was completed with reperfusion.  Residual significant RCA disease (needs PCI) and distal LAD disease (medical therapy recommended).  Assessment & Plan    1.  Inferolateral ST elevation myocardial infarction, with multisite stenting and very nice angiographic result during acute infarction 24 hours ago. 2.  Multivessel CAD: Status post circumflex stenting acutely.  Residual high-grade mid and distal RCA disease.  With improved kidney function will plan PCI on RCA tomorrow.  Scheduled with Dr. Tresa Endo at 12 noon.  Precath orders are written.  High intensity statin therapy and dual antiplatelet therapy (aspirin and Brilinta for 12 months). 3.  Multifactorial CKD stage III (DM II and hypertension): Kidney function is stable/improved.  Will gently hydrate starting this evening.  We need to be careful not to put the patient in worsened heart failure. 4.  Acute on chronic combined systolic and diastolic heart failure: Patient has gallop on exam.  Overall LV function post infarct is unknown.  EF was greater than 50% in 2016.  Plan beta-blocker therapy, institution of ACE/ARB therapy after kidney function stable, for the time being will use long-acting nitrates and hydralazine for blood pressure control.   Plan staged RCA PCI tomorrow.  Procedure and risks including stroke, death, bleeding, myocardial infarction, limb ischemia, acute kidney  failure/dialysis, allergy, among others were discussed in detail with the patient and accepted.  For questions or updates, please contact [facility] Please consult [email or web address] for contact info under Cardiology/STEMI.      Signed, Lesleigh Noe, MD  11/11/2017, 8:10 AM

## 2017-11-11 NOTE — Progress Notes (Signed)
Progress Note  Patient Name: [name] Date of Encounter: 11/11/2017  Primary Cardiologist: Thurmon Fair, MD   Subjective   He is doing relatively well.  He denies chest pain.  No groin or radial access site complaints.  Has not been out of bed yet.  Uses a walker at home because of bad knees and difficulty with falls.  Inpatient Medications    Scheduled Meds: . allopurinol  300 mg Oral Daily  . aspirin EC  81 mg Oral Daily  . atorvastatin  80 mg Oral q1800  . heparin  5,000 Units Subcutaneous Q8H  . insulin aspart  0-15 Units Subcutaneous TID WC  . latanoprost  1 drop Both Eyes QHS  . mouth rinse  15 mL Mouth Rinse BID  . sodium chloride flush  3 mL Intravenous Q12H  . sodium chloride flush  3 mL Intravenous Q12H  . tamsulosin  0.4 mg Oral Daily  . ticagrelor  90 mg Oral BID   Continuous Infusions: . sodium chloride    . sodium chloride 100 mL/hr at 11/11/17 0008  . sodium chloride     PRN Meds: sodium chloride, sodium chloride, acetaminophen, diazepam, nitroGLYCERIN, ondansetron (ZOFRAN) IV, sodium chloride flush, sodium chloride flush   Vital Signs    Vitals:   11/11/17 0400 11/11/17 0500 11/11/17 0600 11/11/17 0700  BP: (!) 160/96 (!) 157/97 (!) 160/94 (!) 154/93  Pulse: 76 76 74 73  Resp: 12 19 13 13   Temp: 98.3 F (36.8 C)     TempSrc: Oral     SpO2: 100% 98% 97% 97%  Weight:      Height:        Intake/Output Summary (Last 24 hours) at 11/11/2017 0810 Last data filed at 11/11/2017 0700 Gross per 24 hour  Intake 2832.37 ml  Output 1675 ml  Net 1157.37 ml   Filed Weights   11/10/17 1400 11/11/17 0357  Weight: 237 lb 7 oz (107.7 kg) 239 lb 3.2 oz (108.5 kg)    Telemetry    Normal sinus rhythm.- Personally Reviewed  ECG    Right bundle, left anterior hemiblock, sinus rhythm, and when compared to the prior tracing from admission, ST elevation has completely resolved.  No Q waves are noted.- Personally Reviewed  Physical Exam  Elderly,  obese African-American male in no respiratory distress. GEN: No acute distress.   Neck: Unable to assess JVD due to obesity and full beard. Cardiac: RRR, no murmurs, rub.  S4 gallop is heard.  Right radial and right femoral access sites unremarkable. Respiratory: Clear to auscultation bilaterally. GI: Soft, nontender, non-distended  MS: No edema; No deformity. Neuro:  Nonfocal  Psych: Normal affect   Labs    Chemistry Recent Labs  Lab 11/10/17 1100 11/10/17 1111 11/11/17 0217  NA 139 142 138  K 3.4* 3.4* 3.9  CL 106 104 104  CO2 24  --  23  GLUCOSE 144* 147* 121*  BUN 21* 22* 17  CREATININE 1.55* 1.40* 1.31*  CALCIUM 8.9  --  8.7*  PROT 6.9  --  6.8  ALBUMIN 3.5  --  3.4*  AST 16  --  204*  ALT 12*  --  45  ALKPHOS 61  --  58  BILITOT 0.8  --  0.8  GFRNONAA 39*  --  48*  GFRAA 46*  --  56*  ANIONGAP 9  --  11     Hematology Recent Labs  Lab 11/10/17 1100 11/10/17 1111 11/11/17 0217  WBC 4.9  --  6.8  RBC 4.86  --  4.48  HGB 13.8 14.3 12.9*  HCT 42.9 42.0 40.5  MCV 88.3  --  90.4  MCH 28.4  --  28.8  MCHC 32.2  --  31.9  RDW 13.2  --  13.4  PLT 197  --  168    Cardiac Enzymes Recent Labs  Lab 11/10/17 1500 11/11/17 0217  TROPONINI 51.42* >65.00*    Recent Labs  Lab 11/10/17 1109  TROPIPOC 0.03     BNPNo results for input(s): BNP, PROBNP in the last 168 hours.   DDimer No results for input(s): DDIMER in the last 168 hours.   Radiology    Dg Chest Port 1 View  Result Date: 11/10/2017 CLINICAL DATA:  Chest pain EXAM: PORTABLE CHEST 1 VIEW COMPARISON:  09/07/2014 chest radiograph. FINDINGS: Stimulator leads overlie the lower thoracic spine. Stable cardiomediastinal silhouette with top-normal heart size. No pneumothorax. No pleural effusion. Hypo inspiratory radiograph. No pulmonary edema. No acute consolidative airspace disease. IMPRESSION: Hypo inspiratory radiograph with no active cardiopulmonary disease. Electronically Signed   By: Delbert Phenix  M.D.   On: 11/10/2017 11:17    Cardiac Studies   Coronary angiography 11/11/2017: Coronary Diagrams   Diagnostic Diagram       Post-Intervention Diagram          Patient Profile     84 y.o. male history of hypertension, type 2 diabetes mellitus, hyperlipidemia, diastolic dysfunction, prior history of syncope, orthostatic hypotension, CKD stage III secondary to diabetes and hypertension who presented with acute inferolateral MI on 11/10/2017 due to total occlusion of the circumflex in the setting of multivessel CAD.  Complex interventional circumflex was completed with reperfusion.  Residual significant RCA disease (needs PCI) and distal LAD disease (medical therapy recommended).  Assessment & Plan    1.  Inferolateral ST elevation myocardial infarction, with multisite stenting and very nice angiographic result during acute infarction 24 hours ago. 2.  Multivessel CAD: Status post circumflex stenting acutely.  Residual high-grade mid and distal RCA disease.  With improved kidney function will plan PCI on RCA tomorrow.  Scheduled with Dr. Tresa Endo at 12 noon.  Precath orders are written.  High intensity statin therapy and dual antiplatelet therapy (aspirin and Brilinta for 12 months). 3.  Multifactorial CKD stage III (DM II and hypertension): Kidney function is stable/improved.  Will gently hydrate starting this evening.  We need to be careful not to put the patient in worsened heart failure. 4.  Acute on chronic combined systolic and diastolic heart failure: Patient has gallop on exam.  Overall LV function post infarct is unknown.  EF was greater than 50% in 2016.  Plan beta-blocker therapy, institution of ACE/ARB therapy after kidney function stable, for the time being will use long-acting nitrates and hydralazine for blood pressure control.   Plan staged RCA PCI tomorrow.  Procedure and risks including stroke, death, bleeding, myocardial infarction, limb ischemia, acute kidney  failure/dialysis, allergy, among others were discussed in detail with the patient and accepted.  For questions or updates, please contact [facility] Please consult [email or web address] for contact info under Cardiology/STEMI.      Signed, Lesleigh Noe, MD  11/11/2017, 8:10 AM

## 2017-11-11 NOTE — Plan of Care (Signed)
  Problem: Education: Goal: Understanding of CV disease, CV risk reduction, and recovery process will improve Outcome: Progressing   

## 2017-11-11 NOTE — Progress Notes (Signed)
  Echocardiogram 2D Echocardiogram has been performed.  Belva Chimes 11/11/2017, 3:56 PM

## 2017-11-11 NOTE — Plan of Care (Signed)
  Problem: Activity: Goal: Ability to return to baseline activity level will improve Outcome: Progressing   Problem: Activity: Goal: Risk for activity intolerance will decrease Outcome: Progressing   

## 2017-11-11 NOTE — Progress Notes (Signed)
CARDIAC REHAB PHASE I   PRE:  Rate/Rhythm: 88 SR  BP:  Supine: 149/82  Sitting:   Standing:    SaO2: 97%RA  MODE:  Ambulation: around bed to recliner ft   POST:  Rate/Rhythm: 96 SR  BP:  Supine:   Sitting: 163/96  Standing:    SaO2: 96%RA 1000-1025 Pt had not been OOB yet. Had pt stand and c/o knees weak. Since pt had not been up yet, we just walked around bed and to recliner with asst x 2, gait belt use and rolling walker. Needed verbal cues to walk since legally blind. To recliner with call bell. Pt stated he can see on call bell where to call for RN. Notified RN needs PT/OT consult.   Luetta Nutting, RN BSN  11/11/2017 10:21 AM

## 2017-11-12 ENCOUNTER — Encounter (HOSPITAL_COMMUNITY): Payer: Self-pay | Admitting: General Practice

## 2017-11-12 ENCOUNTER — Inpatient Hospital Stay (HOSPITAL_COMMUNITY)
Admission: EM | Disposition: A | Discharge status: Home / Self Care | Payer: Self-pay | Source: Home / Self Care | Attending: Cardiovascular Disease

## 2017-11-12 DIAGNOSIS — I25110 Atherosclerotic heart disease of native coronary artery with unstable angina pectoris: Secondary | ICD-10-CM

## 2017-11-12 HISTORY — PX: CORONARY ANGIOGRAPHY: CATH118303

## 2017-11-12 HISTORY — PX: CORONARY STENT INTERVENTION: CATH118234

## 2017-11-12 LAB — TROPONIN I: Troponin I: 23.16 ng/mL (ref <0.03)

## 2017-11-12 LAB — GLUCOSE, CAPILLARY
Glucose-Capillary: 127 mg/dL — ABNORMAL HIGH (ref 65–99)
Glucose-Capillary: 140 mg/dL — ABNORMAL HIGH (ref 65–99)
Glucose-Capillary: 156 mg/dL — ABNORMAL HIGH (ref 65–99)
Glucose-Capillary: 112 mg/dL — ABNORMAL HIGH (ref 65–99)

## 2017-11-12 LAB — POCT ACTIVATED CLOTTING TIME: Activated Clotting Time: 549 seconds

## 2017-11-12 SURGERY — CORONARY STENT INTERVENTION
Anesthesia: LOCAL

## 2017-11-12 MED ORDER — IOPAMIDOL (ISOVUE-370) INJECTION 76%
INTRAVENOUS | Status: DC | PRN
  Administered 2017-11-12: 165 mL via INTRA_ARTERIAL

## 2017-11-12 MED ORDER — SODIUM CHLORIDE 0.9 % IV SOLN
INTRAVENOUS | Status: AC
  Administered 2017-11-12: 21:00:00 via INTRAVENOUS

## 2017-11-12 MED ORDER — HYDRALAZINE HCL 20 MG/ML IJ SOLN: 5.0000 mg | INTRAMUSCULAR | Status: AC | PRN

## 2017-11-12 MED ORDER — BIVALIRUDIN TRIFLUOROACETATE 250 MG IV SOLR
INTRAVENOUS | Status: AC
  Filled 2017-11-12: qty 250

## 2017-11-12 MED ORDER — IOPAMIDOL (ISOVUE-370) INJECTION 76%
INTRAVENOUS | Status: AC
  Filled 2017-11-12: qty 125

## 2017-11-12 MED ORDER — MIDAZOLAM HCL 2 MG/2ML IJ SOLN
INTRAMUSCULAR | Status: AC
  Filled 2017-11-12: qty 2

## 2017-11-12 MED ORDER — FENTANYL CITRATE (PF) 100 MCG/2ML IJ SOLN
INTRAMUSCULAR | Status: DC | PRN
  Administered 2017-11-12 (×2): 25 ug via INTRAVENOUS

## 2017-11-12 MED ORDER — CARVEDILOL 3.125 MG PO TABS
3.1250 mg | ORAL_TABLET | Freq: Two times a day (BID) | ORAL | Status: DC
  Administered 2017-11-12 – 2017-11-13 (×2): 3.125 mg via ORAL
  Filled 2017-11-12 (×3): qty 1

## 2017-11-12 MED ORDER — SODIUM CHLORIDE 0.9 % IV SOLN
INTRAVENOUS | Status: DC
  Administered 2017-11-12: 13:00:00 via INTRAVENOUS

## 2017-11-12 MED ORDER — SODIUM CHLORIDE 0.9 % IV SOLN
INTRAVENOUS | Status: DC | PRN
  Administered 2017-11-12 (×2): 1.75 mg/kg/h via INTRAVENOUS

## 2017-11-12 MED ORDER — NITROGLYCERIN 1 MG/10 ML FOR IR/CATH LAB
INTRA_ARTERIAL | Status: AC
  Filled 2017-11-12: qty 10

## 2017-11-12 MED ORDER — BIVALIRUDIN BOLUS VIA INFUSION - CUPID
INTRAVENOUS | Status: DC | PRN
  Administered 2017-11-12: 81.15 mg via INTRAVENOUS

## 2017-11-12 MED ORDER — MIDAZOLAM HCL 2 MG/2ML IJ SOLN
INTRAMUSCULAR | Status: DC | PRN
  Administered 2017-11-12: 1 mg via INTRAVENOUS

## 2017-11-12 MED ORDER — DIAZEPAM 5 MG PO TABS: 5.0000 mg | ORAL_TABLET | Freq: Four times a day (QID) | ORAL | Status: DC | PRN

## 2017-11-12 MED ORDER — LIDOCAINE HCL (PF) 1 % IJ SOLN
INTRAMUSCULAR | Status: DC | PRN
  Administered 2017-11-12: 20 mL

## 2017-11-12 MED ORDER — LABETALOL HCL 5 MG/ML IV SOLN: 10.0000 mg | INTRAVENOUS | Status: AC | PRN

## 2017-11-12 MED ORDER — SODIUM CHLORIDE 0.9% FLUSH: 3.0000 mL | INTRAVENOUS | Status: DC | PRN

## 2017-11-12 MED ORDER — AMLODIPINE BESYLATE 5 MG PO TABS
5.0000 mg | ORAL_TABLET | Freq: Every day | ORAL | Status: DC
Start: 2017-11-12 — End: 2017-11-13
  Administered 2017-11-12 – 2017-11-13 (×2): 5 mg via ORAL
  Filled 2017-11-12 (×3): qty 1

## 2017-11-12 MED ORDER — TICAGRELOR 90 MG PO TABS
90.0000 mg | ORAL_TABLET | Freq: Two times a day (BID) | ORAL | Status: DC
  Administered 2017-11-12 – 2017-11-13 (×2): 90 mg via ORAL
  Filled 2017-11-12 (×2): qty 1

## 2017-11-12 MED ORDER — IOPAMIDOL (ISOVUE-370) INJECTION 76%
INTRAVENOUS | Status: AC
  Filled 2017-11-12: qty 100

## 2017-11-12 MED ORDER — LIDOCAINE HCL (PF) 1 % IJ SOLN
INTRAMUSCULAR | Status: AC
  Filled 2017-11-12: qty 30

## 2017-11-12 MED ORDER — NITROGLYCERIN 1 MG/10 ML FOR IR/CATH LAB
INTRA_ARTERIAL | Status: DC | PRN
  Administered 2017-11-12 (×4): 200 ug via INTRACORONARY

## 2017-11-12 MED ORDER — ATORVASTATIN CALCIUM 80 MG PO TABS
80.0000 mg | ORAL_TABLET | Freq: Every day | ORAL | Status: DC
  Administered 2017-11-12: 18:00:00 80 mg via ORAL
  Filled 2017-11-12: qty 1

## 2017-11-12 MED ORDER — HEPARIN (PORCINE) IN NACL 2-0.9 UNITS/ML
INTRAMUSCULAR | Status: AC | PRN
  Administered 2017-11-12 (×2): 500 mL via INTRA_ARTERIAL

## 2017-11-12 MED ORDER — ACETAMINOPHEN 325 MG PO TABS: 650.0000 mg | ORAL_TABLET | ORAL | Status: DC | PRN

## 2017-11-12 MED ORDER — FENTANYL CITRATE (PF) 100 MCG/2ML IJ SOLN
INTRAMUSCULAR | Status: AC
  Filled 2017-11-12: qty 2

## 2017-11-12 MED ORDER — HEPARIN (PORCINE) IN NACL 1000-0.9 UT/500ML-% IV SOLN
INTRAVENOUS | Status: AC
  Filled 2017-11-12: qty 1000

## 2017-11-12 MED ORDER — SODIUM CHLORIDE 0.9% FLUSH
3.0000 mL | Freq: Two times a day (BID) | INTRAVENOUS | Status: DC
  Administered 2017-11-12 – 2017-11-18 (×12): 3 mL via INTRAVENOUS

## 2017-11-12 MED ORDER — ONDANSETRON HCL 4 MG/2ML IJ SOLN
4.0000 mg | Freq: Four times a day (QID) | INTRAMUSCULAR | Status: DC | PRN
  Administered 2017-11-13 (×2): 4 mg via INTRAVENOUS
  Filled 2017-11-12 (×2): qty 2

## 2017-11-12 MED ORDER — SODIUM CHLORIDE 0.9 % IV SOLN: 250.0000 mL | INTRAVENOUS | Status: DC | PRN

## 2017-11-12 MED ORDER — ASPIRIN 81 MG PO CHEW
81.0000 mg | CHEWABLE_TABLET | Freq: Every day | ORAL | Status: DC
  Administered 2017-11-13: 81 mg via ORAL
  Filled 2017-11-12 (×2): qty 1

## 2017-11-12 SURGICAL SUPPLY — 20 items
BALLN EMERGE MR 2.0X12 (BALLOONS) ×2
BALLN EMERGE MR 2.75X12 (BALLOONS) ×2
BALLN SAPPHIRE ~~LOC~~ 2.5X10 (BALLOONS) ×1 IMPLANT
BALLN ~~LOC~~ EMERGE MR 3.25X15 (BALLOONS) ×2
BALLOON EMERGE MR 2.0X12 (BALLOONS) IMPLANT
BALLOON EMERGE MR 2.75X12 (BALLOONS) IMPLANT
BALLOON ~~LOC~~ EMERGE MR 3.25X15 (BALLOONS) IMPLANT
CATH INFINITI 5FR JL5 (CATHETERS) ×1 IMPLANT
CATH LAUNCHER 6FR JR4 (CATHETERS) ×1 IMPLANT
HOVERMATT SINGLE USE (MISCELLANEOUS) ×1 IMPLANT
KIT ENCORE 26 ADVANTAGE (KITS) ×1 IMPLANT
KIT HEART LEFT (KITS) ×2 IMPLANT
PACK CARDIAC CATHETERIZATION (CUSTOM PROCEDURE TRAY) ×2 IMPLANT
SHEATH PINNACLE 6F 10CM (SHEATH) ×1 IMPLANT
STENT RESOLUTE ONYX 2.25X12 (Permanent Stent) ×1 IMPLANT
STENT RESOLUTE ONYX 3.0X26 (Permanent Stent) ×1 IMPLANT
TRANSDUCER W/STOPCOCK (MISCELLANEOUS) ×2 IMPLANT
TUBING CIL FLEX 10 FLL-RA (TUBING) ×2 IMPLANT
WIRE ASAHI PROWATER 180CM (WIRE) ×1 IMPLANT
WIRE EMERALD 3MM-J .035X150CM (WIRE) ×1 IMPLANT

## 2017-11-12 NOTE — Interval H&P Note (Signed)
Cath Lab Visit (complete for each Cath Lab visit)  Clinical Evaluation Leading to the Procedure:   ACS: Yes.    Non-ACS:    Anginal Classification: CCS IV  Anti-ischemic medical therapy: Minimal Therapy (1 class of medications)  Non-Invasive Test Results: No non-invasive testing performed  Prior CABG: No previous CABG      History and Physical Interval Note:  11/12/2017 9:05 AM  [name]  has presented today for surgery, with the diagnosis of cad  The various methods of treatment have been discussed with the patient and family. After consideration of risks, benefits and other options for treatment, the patient has consented to  Procedure(s): CORONARY STENT INTERVENTION (N/A) as a surgical intervention .  The patient's history has been reviewed, patient examined, no change in status, stable for surgery.  I have reviewed the patient's chart and labs.  Questions were answered to the patient's satisfaction.     Nicki Gu[place]ajara

## 2017-11-12 NOTE — Progress Notes (Signed)
Patient complains of intermittent mild SOB.  Lung sounds clear, diminished, Adequate Oxygen saturation.  Possibly Brilinta related.  Encouraged caffeine intake.

## 2017-11-12 NOTE — Progress Notes (Signed)
Sheath removal. 6 Fr sheath removed from the right femoral artery.  20 min manual pressure held.  Transparent dressing applied.  Site Level 0.  Doppler pulse, DP & PT. See flowsheet for vitals.  Bruising from previous procedure on Saturday.

## 2017-11-12 NOTE — Progress Notes (Addendum)
The patient has been seen in conjunction with Fabian Sharp, PA-C. All aspects of care have been considered and discussed. The patient has been personally interviewed, examined, and all clinical data has been reviewed.   Stable overnight.  A.m. be met still pending  Ready for staged PCI of the right coronary.  If all goes well, kidney function remains stable, and patient is ambulatory, he may be discharged tomorrow.  Progress Note  Patient Name: [name] Date of Encounter: 11/12/2017  Primary Cardiologist: Sanda Klein, MD   Subjective   Pt resting comfortably mostly supine and sleeping, states he has been SOB since Sunday 6/9. No chest pain.  Inpatient Medications    Scheduled Meds: . allopurinol  300 mg Oral Daily  . aspirin EC  81 mg Oral Daily  . atorvastatin  80 mg Oral q1800  . heparin  5,000 Units Subcutaneous Q8H  . insulin aspart  0-15 Units Subcutaneous TID WC  . latanoprost  1 drop Both Eyes QHS  . mouth rinse  15 mL Mouth Rinse BID  . sodium chloride flush  3 mL Intravenous Q12H  . tamsulosin  0.4 mg Oral Daily  . ticagrelor  90 mg Oral BID   Continuous Infusions: . sodium chloride    . sodium chloride 1 mL/kg/hr (11/12/17 6979)   PRN Meds: sodium chloride, acetaminophen, diazepam, nitroGLYCERIN, ondansetron (ZOFRAN) IV, sodium chloride flush   Vital Signs    Vitals:   11/11/17 2020 11/11/17 2346 11/12/17 0500 11/12/17 0558  BP: (!) 158/85 (!) 144/94  (!) 168/106  Pulse: (!) 103 98  96  Resp: 19 (!) 22    Temp: 99.1 F (37.3 C) 98.2 F (36.8 C)  98.8 F (37.1 C)  TempSrc: Oral Oral  Oral  SpO2: 97% 100%    Weight:   238 lb 8 oz (108.2 kg)   Height:        Intake/Output Summary (Last 24 hours) at 11/12/2017 0742 Last data filed at 11/12/2017 4801 Gross per 24 hour  Intake 1385.33 ml  Output 2151 ml  Net -765.67 ml   Filed Weights   11/10/17 1400 11/11/17 0357 11/12/17 0500  Weight: 237 lb 7 oz (107.7 kg) 239 lb 3.2 oz (108.5  kg) 238 lb 8 oz (108.2 kg)    Telemetry    sinus - Personally Reviewed  ECG    Sinus with RBBB, LAFB - Personally Reviewed  Physical Exam   GEN: No acute distress.   Neck: No JVD Cardiac: RRR, no murmurs, rubs, or gallops. Right radial site C/D/I, groin site covered without active bleeding Respiratory: Clear to auscultation bilaterally, no crackles, respirations unlabored GI: Soft, nontender, non-distended  MS: No edema; No deformity. Neuro:  Nonfocal  Psych: Normal affect   Labs    Chemistry Recent Labs  Lab 11/10/17 1100 11/10/17 1111 11/11/17 0217  NA 139 142 138  K 3.4* 3.4* 3.9  CL 106 104 104  CO2 24  --  23  GLUCOSE 144* 147* 121*  BUN 21* 22* 17  CREATININE 1.55* 1.40* 1.31*  CALCIUM 8.9  --  8.7*  PROT 6.9  --  6.8  ALBUMIN 3.5  --  3.4*  AST 16  --  204*  ALT 12*  --  45  ALKPHOS 61  --  58  BILITOT 0.8  --  0.8  GFRNONAA 39*  --  48*  GFRAA 46*  --  56*  ANIONGAP 9  --  11  Hematology Recent Labs  Lab 11/10/17 1100 11/10/17 1111 11/11/17 0217  WBC 4.9  --  6.8  RBC 4.86  --  4.48  HGB 13.8 14.3 12.9*  HCT 42.9 42.0 40.5  MCV 88.3  --  90.4  MCH 28.4  --  28.8  MCHC 32.2  --  31.9  RDW 13.2  --  13.4  PLT 197  --  168    Cardiac Enzymes Recent Labs  Lab 11/10/17 1500 11/11/17 0217  TROPONINI 51.42* >65.00*    Recent Labs  Lab 11/10/17 1109  TROPIPOC 0.03     BNPNo results for input(s): BNP, PROBNP in the last 168 hours.   DDimer No results for input(s): DDIMER in the last 168 hours.   Radiology    Dg Chest Port 1 View  Result Date: 11/10/2017 CLINICAL DATA:  Chest pain EXAM: PORTABLE CHEST 1 VIEW COMPARISON:  09/07/2014 chest radiograph. FINDINGS: Stimulator leads overlie the lower thoracic spine. Stable cardiomediastinal silhouette with top-normal heart size. No pneumothorax. No pleural effusion. Hypo inspiratory radiograph. No pulmonary edema. No acute consolidative airspace disease. IMPRESSION: Hypo inspiratory  radiograph with no active cardiopulmonary disease. Electronically Signed   By: Ilona Sorrel M.D.   On: 11/10/2017 11:17    Cardiac Studies   Echo 11/11/17: Study Conclusions  - Left ventricle: The cavity size was normal. There was moderate   concentric hypertrophy. Systolic function was mildly to   moderately reduced. The estimated ejection fraction was in the   range of 40% to 45%. Doppler parameters are consistent with   abnormal left ventricular relaxation (grade 1 diastolic   dysfunction). There was no evidence of elevated ventricular   filling pressure by Doppler parameters. - Aortic valve: Valve area (VTI): 2.08 cm^2. Valve area (Vmax):   2.13 cm^2. Valve area (Vmean): 1.98 cm^2. - Aortic root: The aortic root was normal in size. - Mitral valve: Valve area by pressure half-time: 1.28 cm^2. - Left atrium: The atrium was normal in size. - Right ventricle: The cavity size was normal. Wall thickness was   normal. Systolic function was normal. - Right atrium: The atrium was normal in size. - Tricuspid valve: There was no regurgitation. - Pulmonary arteries: Systolic pressure could not be accurately   estimated. - Inferior vena cava: The vessel was normal in size. - Pericardium, extracardiac: There was no pericardial effusion.  Impressions:  - There is akinesis of the basal and mid inferior, inefrolateral   and apical inferior leads.   Left heart cath 11/10/17:  Mid RCA lesion is 95% stenosed.  Dist RCA lesion is 80% stenosed.  Prox Cx to Mid Cx lesion is 90% stenosed.  Ost 2nd Mrg to 2nd Mrg lesion is 100% stenosed.  2nd Mrg lesion is 95% stenosed.  1st Diag lesion is 95% stenosed.  Mid LAD lesion is 80% stenosed.  Dist LAD lesion is 90% stenosed.  Post intervention, there is a 0% residual stenosis.  Post intervention, there is a 0% residual stenosis.  Post intervention, there is a 0% residual stenosis.  A stent was successfully placed.  A stent was  successfully placed.  A stent was successfully placed.   Acute inferolateral ST segment elevation myocardial infarction secondary to total occlusion of the circumflex vessel.  Multivessel CAD with 30% proximal and 95% distal diagonal 1 stenoses of the LAD, 80% mid distal and 90% distal LAD stenoses, and large dominant RCA with 95% mid stenosis and 80% distal stenosis prior to the PDA takeoff.  Successful complex percutaneous coronary intervention involving the proximal circumflex, proximal large OM vessel and distal inferior branch of this vessel after a large additional branch which cannot be visualized on the diagram above.  A 2.0 x 12 mm Resolute Onyx DES stent was placed in the distal inferior secondary branch of the marginal vessel and reduced to 0%; the site of 100% occlusion was treated with PTCA/DES stenting with a 2.5 x 18 mm Resolute DES stent positioned just proximal to the more distal bifurcation into a large branch and distal inferior branch with the 100% occlusion being reduced to 0% with post dilatation up to 3.0 mm; and the proximal 90% stenosis on a sharp bend of the vessel being reduced to 0% with ultimate insertion of a 3.0 x 18 mm Resolute stent postdilated to 3.34 mm.  LVEDP  24 mm Hg.  RECOMMENDATION: DAPT therapy for minimum of 1 year, and probably indefinitely.  Once the patient blood pressure is stable we will initiate beta-blocker and ACE/arm therapy.  A 2D echo Doppler study will be done to assess LV function.  The patient will be hydrated post procedure.  On Tuesday, November 12, 2017 if renal function is stable, plan stageD PCI to the mid and distal RCA with relook at the left system.   Diagnostic Diagram       Post-Intervention Diagram           Patient Profile     84 y.o. male with a history of hypertension, type 2 diabetes mellitus, hyperlipidemia, diastolic dysfunction, prior history of syncope, orthostatic hypotension, CKD stage III secondary to  diabetes and hypertension who presented with acute inferolateral MI on 11/10/2017 due to total occlusion of the circumflex in the setting of multivessel CAD.  Complex interventional circumflex was completed with reperfusion.  Residual significant RCA disease (needs PCI) and distal LAD disease (medical therapy recommended).  Assessment & Plan    1. Inferolateral STEMI - total occlusion of Cx with DES to proximal Cx and DES x 2 to proximal large OM and distal inferior branch of this OM - radial sites C/D/I, groin site covered with old blood under dressing, no active bleeding - pt denies chest pain   2.multi-vessel disease - mid RCA with 95% stenosis - planned staged PCI on RCA today   3. Acute on chronic kidney disease stage III -  sCr 1.31 (1.40), making good urine  - baseline appears to be 1.1-1.2   4. Acute on chronic combined systolic and diastolic heart failure - echo yesterday with LVEF 40-45% and grade 1 DD - pt tolerated gentle hydration overnight, but states he has been SOB since Sunday. I do not hear crackles on exam and no LE edema. I will hold off on lasix for now since staged PCI is planned for today. - patient is resting comfortably nearly supine; I suspect he will be able to tolerate heart cath today    For questions or updates, please contact [facility] Please consult [email or web address] for contact info under Cardiology/STEMI.      Signed, Tami Lin Duke, PA  11/12/2017, 7:42 AM

## 2017-11-13 ENCOUNTER — Inpatient Hospital Stay (HOSPITAL_COMMUNITY): Payer: Medicare HMO

## 2017-11-13 ENCOUNTER — Encounter (HOSPITAL_COMMUNITY): Payer: Self-pay | Admitting: Cardiovascular Disease

## 2017-11-13 DIAGNOSIS — I10 Essential (primary) hypertension: Secondary | ICD-10-CM

## 2017-11-13 DIAGNOSIS — G4730 Sleep apnea, unspecified: Secondary | ICD-10-CM | POA: Diagnosis present

## 2017-11-13 DIAGNOSIS — R1114 Bilious vomiting: Secondary | ICD-10-CM

## 2017-11-13 DIAGNOSIS — E669 Obesity, unspecified: Secondary | ICD-10-CM | POA: Diagnosis present

## 2017-11-13 DIAGNOSIS — K567 Ileus, unspecified: Secondary | ICD-10-CM | POA: Diagnosis present

## 2017-11-13 LAB — BASIC METABOLIC PANEL
Anion gap: 9 (ref 5–15)
BUN: 9 mg/dL (ref 6–20)
CO2: 25 mmol/L (ref 22–32)
Calcium: 8.9 mg/dL (ref 8.9–10.3)
Chloride: 107 mmol/L (ref 101–111)
Creatinine, Ser: 1.26 mg/dL — ABNORMAL HIGH (ref 0.61–1.24)
GFR calc Af Amer: 59 mL/min — ABNORMAL LOW (ref >60)
GFR calc non Af Amer: 51 mL/min — ABNORMAL LOW (ref >60)
Glucose, Bld: 163 mg/dL — ABNORMAL HIGH (ref 65–99)
Potassium: 3.6 mmol/L (ref 3.5–5.1)
Sodium: 141 mmol/L (ref 135–145)

## 2017-11-13 LAB — GLUCOSE, CAPILLARY
Glucose-Capillary: 142 mg/dL — ABNORMAL HIGH (ref 65–99)
Glucose-Capillary: 138 mg/dL — ABNORMAL HIGH (ref 65–99)
Glucose-Capillary: 140 mg/dL — ABNORMAL HIGH (ref 65–99)
Glucose-Capillary: 109 mg/dL — ABNORMAL HIGH (ref 65–99)

## 2017-11-13 LAB — CBC WITH DIFFERENTIAL/PLATELET
Abs Immature Granulocytes: 0 10*3/uL (ref 0.0–0.1)
Basophils Absolute: 0 10*3/uL (ref 0.0–0.1)
Basophils Relative: 0 %
Eosinophils Absolute: 0.1 10*3/uL (ref 0.0–0.7)
Eosinophils Relative: 2 %
HCT: 37.6 % — ABNORMAL LOW (ref 39.0–52.0)
Hemoglobin: 12 g/dL — ABNORMAL LOW (ref 13.0–17.0)
Immature Granulocytes: 1 %
Lymphocytes Relative: 12 %
Lymphs Abs: 0.7 10*3/uL (ref 0.7–4.0)
MCH: 28.4 pg (ref 26.0–34.0)
MCHC: 31.9 g/dL (ref 30.0–36.0)
MCV: 89.1 fL (ref 78.0–100.0)
Monocytes Absolute: 0.6 10*3/uL (ref 0.1–1.0)
Monocytes Relative: 10 %
Neutro Abs: 4.4 10*3/uL (ref 1.7–7.7)
Neutrophils Relative %: 75 %
Platelets: 197 10*3/uL (ref 150–400)
RBC: 4.22 MIL/uL (ref 4.22–5.81)
RDW: 13.6 % (ref 11.5–15.5)
WBC: 5.9 10*3/uL (ref 4.0–10.5)

## 2017-11-13 LAB — CBC
HCT: 38.2 % — ABNORMAL LOW (ref 39.0–52.0)
Hemoglobin: 12 g/dL — ABNORMAL LOW (ref 13.0–17.0)
MCH: 28.6 pg (ref 26.0–34.0)
MCHC: 31.4 g/dL (ref 30.0–36.0)
MCV: 91 fL (ref 78.0–100.0)
Platelets: 166 10*3/uL (ref 150–400)
RBC: 4.2 MIL/uL — ABNORMAL LOW (ref 4.22–5.81)
RDW: 13.6 % (ref 11.5–15.5)
WBC: 7 10*3/uL (ref 4.0–10.5)

## 2017-11-13 LAB — LIPASE, BLOOD: Lipase: 42 U/L (ref 11–51)

## 2017-11-13 LAB — LACTIC ACID, PLASMA
Lactic Acid, Venous: 0.7 mmol/L (ref 0.5–1.9)
Lactic Acid, Venous: 0.8 mmol/L (ref 0.5–1.9)

## 2017-11-13 LAB — AMYLASE: Amylase: 110 U/L — ABNORMAL HIGH (ref 28–100)

## 2017-11-13 MED ORDER — POTASSIUM CHLORIDE IN NACL 20-0.9 MEQ/L-% IV SOLN
INTRAVENOUS | Status: DC
  Administered 2017-11-13 – 2017-11-14 (×2): via INTRAVENOUS
  Filled 2017-11-13 (×3): qty 1000

## 2017-11-13 MED ORDER — MORPHINE SULFATE (PF) 2 MG/ML IV SOLN: 2.0000 mg | INTRAVENOUS | Status: DC | PRN

## 2017-11-13 MED ORDER — DIATRIZOATE MEGLUMINE & SODIUM 66-10 % PO SOLN
90.0000 mL | Freq: Once | ORAL | Status: AC
  Administered 2017-11-13: 90 mL via NASOGASTRIC
  Filled 2017-11-13: qty 90

## 2017-11-13 MED ORDER — ASPIRIN 300 MG RE SUPP
150.0000 mg | Freq: Every day | RECTAL | Status: DC
  Administered 2017-11-14: 150 mg via RECTAL
  Filled 2017-11-13: qty 1

## 2017-11-13 MED ORDER — INSULIN ASPART 100 UNIT/ML ~~LOC~~ SOLN
0.0000 [IU] | SUBCUTANEOUS | Status: DC
  Administered 2017-11-13 – 2017-11-14 (×2): 2 [IU] via SUBCUTANEOUS
  Administered 2017-11-14: 1 [IU] via SUBCUTANEOUS
  Administered 2017-11-16: 2 [IU] via SUBCUTANEOUS
  Administered 2017-11-16 – 2017-11-17 (×2): 1 [IU] via SUBCUTANEOUS
  Administered 2017-11-17: 2 [IU] via SUBCUTANEOUS

## 2017-11-13 MED ORDER — FUROSEMIDE 10 MG/ML IJ SOLN
40.0000 mg | Freq: Once | INTRAMUSCULAR | Status: AC
  Administered 2017-11-13: 40 mg via INTRAVENOUS
  Filled 2017-11-13: qty 4

## 2017-11-13 MED ORDER — SODIUM CHLORIDE 0.9 % IV SOLN
0.7500 ug/kg/min | INTRAVENOUS | Status: DC
  Administered 2017-11-13 – 2017-11-15 (×4): 0.75 ug/kg/min via INTRAVENOUS
  Filled 2017-11-13 (×6): qty 50

## 2017-11-13 MED FILL — Heparin Sod (Porcine)-NaCl IV Soln 1000 Unit/500ML-0.9%: INTRAVENOUS | Qty: 1000 | Status: AC

## 2017-11-13 NOTE — Care Management Important Message (Signed)
Important Message  Patient Details  Name: [name] MRN: [id number] Date of Birth: 1930-08-28   Medicare Important Message Given:  Yes    [name] [name] 11/13/2017, 12:55 PM

## 2017-11-13 NOTE — Progress Notes (Signed)
   Gastric distention on KUB.  Not able to keep medications down.  With recent stents, dual antiplatelet therapy is essential.  If NG tube required, he will be started on IV Cangrelor (essentially an intravenous substitute for ticagrelor/Brilinta).  Aspirin can be given per rectal suppository.  Need surgical consultation to help with management and discovery of etiology.  Etiology could be gastric atony related to diabetic neuropathy vs other as yet identified possibilities.

## 2017-11-13 NOTE — Progress Notes (Signed)
PT Cancellation Note  Patient Details Name: [name] MALE MRN: [id number] DOB: [birth year]   Cancelled Treatment:    Reason Eval/Treat Not Completed: Medical issues which prohibited therapy Pt initially agreeable to ambulation, however, after obtaining history, pt reports he did not want to work with PT because he was feeling nauseous. Will follow up as schedule allows.   Gladys Damme, PT, DPT  Acute Rehabilitation Services  Pager: [phone]  [facility] 11/13/2017, 1:11 PM

## 2017-11-13 NOTE — Consult Note (Signed)
Consultation note   [name] FEO:[id number] DOB: 02-Jun-1931 DOA: 11/10/2017   PCP: Clayborn Heron, MD   Consulting physician: Ophelia Charter  Requesting physician: Verdis Prime, III/cardiology  Reason for consultation: Acute nausea and vomiting with distended, nonpainful abdomen  HPI: [name] is a 84 y.o. male with medical history significant for 3 chronic kidney disease, obesity and sleep apnea, hypertension, chronic systolic and diastolic heart failure, diabetes on insulin cardiology service for NSTEMI and underwent 2 different cardiac catheterizations for staged coronary intervention/PCI/stent placement x5.  Most recent cardiac catheterization was on 6/11.  This morning patient awakened with marked abdominal distention and episode of emesis.  She has no prior abdominal surgery history.  He is not having any abdominal pain.  He is hemodynamically stable and afebrile.  He has no leukocytosis.  He is not having any bowel movements or diarrhea but he states he has had flatus earlier today.  On exam he has a markedly distended abdomen and bowel sounds are absent.  View abdominal x-ray reveals gaseous Oestreich distention with a single prominent loop of left abdominal bowel consistent with either ileus or evolving bowel obstruction.  Have been asked to see the patient in consultation regarding nausea vomiting and abdominal distention.   Review of Systems:  In addition to the HPI above,  No Fever-chills, myalgias or other constitutional symptoms No Headache, changes with Vision or hearing, new weakness, tingling, numbness in any extremity, dizziness, dysarthria or word finding difficulty, gait disturbance or imbalance, tremors or seizure activity No problems swallowing food or Liquids, indigestion/reflux, choking or coughing while eating, abdominal pain with or after eating No Chest pain, Cough or Shortness of Breath, palpitations, orthopnea or DOE No melena,hematochezia, dark tarry  stools, constipation No dysuria, malodorous urine, hematuria or flank pain No new skin rashes, lesions, masses or bruises, No new joint pains, aches, swelling or redness No recent unintentional weight gain or loss No polyuria, polydypsia or polyphagia   Past Medical History:  Diagnosis Date  . Arthritis    "all over" (11/12/2017)  . Chronic lower back pain   . CKD (chronic kidney disease), stage III (HCC)   . Constipation    takes an OTC med as needed  . Diabetic nephropathy (HCC)   . Diastolic dysfunction    a. 01/2015 Echo: EF 60-65%, no rwma, Gr1 DD, mildly dil LA, PaSP .  . Gastric ulcer 1970s  . Glaucoma, both eyes    uses eye drops daily  . Gout    takes Allopurinol daily  . History of colon polyps   . History of stress test    a. 09/2014 MV: EF 53%, no ischemia/infarct.  . Hyperlipidemia    takes Pravastatin daily  . Hypertension    takes Amlodipine and Losartan daily  . Insomnia    uses OTC sleep aide  . Joint pain   . Joint swelling    right knee   . Legally blind   . Macular degeneration   . Morbid obesity (HCC)   . Nocturia   . Peripheral edema   . Pleurisy 20+yrs ago  . Pneumonia    "I think once when I was a kid" (11/12/2017)  . Sleep apnea   . STEMI (ST elevation myocardial infarction) (HCC) 11/10/2017  . Stroke [facility])    "I've had them; don't know anything about them"; denies residual on 11/12/2017)  . Syncope    a. 09/2014 in setting of orthostasis; b. 10/2014 Event monitor: 19 beat run  of asymp NSVT; c. 01/2015 Echo: EF 60-65%, no rwma.  . Type II diabetes mellitus (HCC)    takes Metformin daily  . Urinary frequency    takes Flomax daily    Past Surgical History:  Procedure Laterality Date  . BACK SURGERY    . CATARACT EXTRACTION W/ INTRAOCULAR LENS  IMPLANT, BILATERAL Bilateral   . COLONOSCOPY    . CORONARY ANGIOGRAPHY N/A 11/12/2017   Procedure: CORONARY ANGIOGRAPHY;  Surgeon: Lennette Bihari, MD;  Location: [facility] INVASIVE CV LAB;  Service:  Cardiovascular;  Laterality: N/A;  . CORONARY STENT INTERVENTION N/A 11/12/2017   Procedure: CORONARY STENT INTERVENTION;  Surgeon: Lennette Bihari, MD;  Location: [facility] INVASIVE CV LAB;  Service: Cardiovascular;  Laterality: N/A;  . CORONARY/GRAFT ACUTE MI REVASCULARIZATION N/A 11/10/2017   Procedure: Coronary/Graft Acute MI Revascularization;  Surgeon: Lennette Bihari, MD;  Location: [facility] INVASIVE CV LAB;  Service: Cardiovascular;  Laterality: N/A;  . EYE SURGERY    . GLAUCOMA SURGERY Bilateral   . INSERTION / PLACEMENT / REVISION NEUROSTIMULATOR  2000s  . KNEE ARTHROSCOPY Right 1990s  . LEFT HEART CATH AND CORONARY ANGIOGRAPHY N/A 11/10/2017   Procedure: LEFT HEART CATH AND CORONARY ANGIOGRAPHY;  Surgeon: Lennette Bihari, MD;  Location: [facility] INVASIVE CV LAB;  Service: Cardiovascular;  Laterality: N/A;  . MINI SHUNT INSERTION  09/26/2011   Procedure: INSERTION OF MINI SHUNT;  Surgeon: Chalmers Guest, MD;  Location: [facility] OR;  Service: Ophthalmology;  Laterality: Right;  Insertion of Ahmed shunt  . POSTERIOR FUSION LUMBAR SPINE  1990s  . REVERSE SHOULDER ARTHROPLASTY Right 09/21/2013   Procedure: RIGHT  SHOULDER ARTHROPLASTY REVERSE ;  Surgeon: [name], MD;  Location: [facility] OR;  Service: Orthopedics;  Laterality: Right;  . SHOULDER INJECTION Left 09/21/2013   Procedure: SHOULDER INJECTION;  Surgeon: [name], MD;  Location: [facility] OR;  Service: Orthopedics;  Laterality: Left;    Social History   Socioeconomic History  . Marital status: Widowed    Spouse name: Not on file  . Number of children: Not on file  . Years of education: Not on file  . Highest education level: Not on file  Occupational History  . Not on file  Social Needs  . Financial resource strain: Not on file  . Food insecurity:    Worry: Not on file    Inability: Not on file  . Transportation needs:    Medical: Not on file    Non-medical: Not on file  Tobacco Use  . Smoking status: Former Smoker    Packs/day: 2.00    Types:  Cigarettes  . Smokeless tobacco: Never Used  . Tobacco comment: 11/12/2017 "nothing since the 1980s"  Substance and Sexual Activity  . Alcohol use: Yes    Comment: 11/12/2017 "nothing since the 1980s"  . Drug use: Never  . Sexual activity: Not Currently  Lifestyle  . Physical activity:    Days per week: Not on file    Minutes per session: Not on file  . Stress: Not on file  Relationships  . Social connections:    Talks on phone: Not on file    Gets together: Not on file    Attends religious service: Not on file    Active member of club or organization: Not on file    Attends meetings of clubs or organizations: Not on file    Relationship status: Not on file  . Intimate partner violence:    Fear of current or ex partner:  Not on file    Emotionally abused: Not on file    Physically abused: Not on file    Forced sexual activity: Not on file  Other Topics Concern  . Not on file  Social History Narrative  . Not on file     No Known Allergies  Family History  Problem Relation Age of Onset  . Anesthesia problems Neg Hx      Prior to Admission medications   Medication Sig Start Date End Date Taking? Authorizing Provider  allopurinol (ZYLOPRIM) 300 MG tablet Take 300 mg by mouth daily.   Yes [provider]  furosemide (LASIX) 20 MG tablet Take 20 mg by mouth daily. 11/04/17  Yes [provider]  loperamide (IMODIUM) 2 MG capsule Take 2 mg by mouth as needed for diarrhea or loose stools.   Yes [provider]  losartan (COZAAR) 50 MG tablet Take 50 mg by mouth daily.   Yes [provider]  metFORMIN (GLUCOPHAGE) 1000 MG tablet Take 2,000 mg by mouth at bedtime.    Yes [provider]  pravastatin (PRAVACHOL) 40 MG tablet Take 40 mg by mouth daily.   Yes [provider]  prednisoLONE acetate (PRED FORTE) 1 % ophthalmic suspension Place 1 drop into the right eye at bedtime.   Yes [provider]  sertraline (ZOLOFT) 50  MG tablet Take 50 mg by mouth at bedtime.   Yes [provider]  Tamsulosin HCl (FLOMAX) 0.4 MG CAPS Take 0.4 mg by mouth daily.   Yes [provider]  timolol (TIMOPTIC) 0.5 % ophthalmic solution Place 1 drop into the right eye daily.   Yes [provider]  TRAVATAN Z 0.004 % SOLN ophthalmic solution Place 1 drop into the left eye at bedtime.  08/19/14  Yes [provider]    Physical Exam: Vitals:   11/13/17 0717 11/13/17 1204 11/13/17 1456 11/13/17 1516  BP: (!) 160/99 (!) 150/94 (!) 159/101 (!) 181/107  Pulse: 94 99 97   Resp: (!) 25 (!) 23 (!) 21 (!) 21  Temp: 98.4 F (36.9 C) 98.4 F (36.9 C) 98.5 F (36.9 C)   TempSrc: Oral  Oral   SpO2: 96% 96% 97%   Weight:      Height:          Constitutional: NAD, calm, uncomfortable in [place] to ongoing abdominal distention-denies pain Eyes: PERRL, lids and conjunctivae normal ENMT: Mucous membranes are moist. Posterior pharynx clear of any exudate or lesions. Neck: normal, supple, no masses, no thyromegaly Respiratory: clear to auscultation bilaterally, no wheezing, no crackles. Normal respiratory effort. No accessory muscle use.  Cardiovascular: Regular rate and rhythm, no murmurs / rubs / gallops. No extremity edema. 2+ pedal pulses. No carotid bruits.  Abdomen: Markedly distended and taut abdomen nontender on exam/palpation, no masses palpated. No hepatosplenomegaly. Bowel sounds are absent.  Musculoskeletal: no clubbing / cyanosis. No joint deformity upper and lower extremities. Good ROM, no contractures. Normal muscle tone.  Skin: no rashes, lesions, ulcers. No induration Neurologic: CN 2-12 grossly intact. Sensation intact, DTR normal. Strength 5/5 x all 4 extremities.  Psychiatric: Normal judgment and insight. Alert and oriented x 3. Normal mood.    Labs on Admission: I have personally reviewed following labs and imaging studies  CBC: Recent Labs  Lab 11/10/17 1100 11/10/17 1111  11/11/17 0217 11/13/17 0245 11/13/17 1604  WBC 4.9  --  6.8 7.0 5.9  NEUTROABS 2.5  --   --   --  4.4  HGB 13.8 14.3 12.9* 12.0* 12.0*  HCT 42.9 42.0 40.5 38.2* 37.6*  MCV 88.3  --  90.4 91.0 89.1  PLT 197  --  168 166 197   Basic Metabolic Panel: Recent Labs  Lab 11/10/17 1100 11/10/17 1111 11/11/17 0217 11/13/17 0245  NA 139 142 138 141  K 3.4* 3.4* 3.9 3.6  CL 106 104 104 107  CO2 24  --  23 25  GLUCOSE 144* 147* 121* 163*  BUN 21* 22* 17 9  CREATININE 1.55* 1.40* 1.31* 1.26*  CALCIUM 8.9  --  8.7* 8.9   GFR: Estimated Creatinine Clearance: 54 mL/min (A) (by C-G formula based on SCr of 1.26 mg/dL (H)). Liver Function Tests: Recent Labs  Lab 11/10/17 1100 11/11/17 0217  AST 16 204*  ALT 12* 45  ALKPHOS 61 58  BILITOT 0.8 0.8  PROT 6.9 6.8  ALBUMIN 3.5 3.4*   Recent Labs  Lab 11/13/17 1555  LIPASE 42  AMYLASE 110*   No results for input(s): AMMONIA in the last 168 hours. Coagulation Profile: No results for input(s): INR, PROTIME in the last 168 hours. Cardiac Enzymes: Recent Labs  Lab 11/10/17 1500 11/11/17 0217 11/12/17 1436  TROPONINI 51.42* >65.00* 23.16*   BNP (last 3 results) No results for input(s): PROBNP in the last 8760 hours. HbA1C: No results for input(s): HGBA1C in the last 72 hours. CBG: Recent Labs  Lab 11/12/17 1130 11/12/17 1729 11/12/17 2134 11/13/17 0607 11/13/17 1207  GLUCAP 140* 156* 112* 142* 138*   Lipid Profile: Recent Labs    11/11/17 0217  CHOL 138  HDL 36*  LDLCALC 88  TRIG 72  CHOLHDL 3.8   Thyroid Function Tests: No results for input(s): TSH, T4TOTAL, FREET4, T3FREE, THYROIDAB in the last 72 hours. Anemia Panel: No results for input(s): VITAMINB12, FOLATE, FERRITIN, TIBC, IRON, RETICCTPCT in the last 72 hours. Urine analysis:    Component Value Date/Time   COLORURINE YELLOW 01/22/2015 0340   APPEARANCEUR CLEAR 01/22/2015 0340   LABSPEC 1.021 01/22/2015 0340   PHURINE 5.5 01/22/2015 0340    GLUCOSEU NEGATIVE 01/22/2015 0340   HGBUR SMALL (A) 01/22/2015 0340   BILIRUBINUR SMALL (A) 01/22/2015 0340   KETONESUR 15 (A) 01/22/2015 0340   PROTEINUR 30 (A) 01/22/2015 0340   UROBILINOGEN 0.2 01/22/2015 0340   NITRITE NEGATIVE 01/22/2015 0340   LEUKOCYTESUR NEGATIVE 01/22/2015 0340   Sepsis Labs: @LABRCNTIP (procalcitonin:4,lacticidven:4) ) Recent Results (from the past 240 hour(s))  MRSA PCR Screening     Status: None   Collection Time: 11/10/17  2:31 PM  Result Value Ref Range Status   MRSA by PCR NEGATIVE NEGATIVE Final    Comment:        The GeneXpert MRSA Assay (FDA approved for NASAL specimens only), is one component of a comprehensive MRSA colonization surveillance program. It is not intended to diagnose MRSA infection nor to guide or monitor treatment for MRSA infections. Performed at [facility], [address], [place], [place] [address]      Radiological Exams on Admission: Dg Abd 1 View  Result Date: 11/13/2017 CLINICAL DATA:  Abdomen pains. EXAM: ABDOMEN - 1 VIEW COMPARISON:  None. FINDINGS: The entire abdomen could not be visualized on a single AP radiograph, due to body habitus, therefore high and low exposures were performed. Despite this, portions of the RIGHT abdomen are not visualized in their entirety. Gaseous distended stomach. Prominent LEFT mid abdominal small bowel loop could represent ileus or partial obstruction. Colonic gas is  present all the way to the rectosigmoid region. A spinal cord stimulator overlies the lower thoracic region with the battery pack posteriorly on the RIGHT. Lumbar spondylosis. IMPRESSION: AP supine radiographs. Gaseous gastric distention. Single prominent LEFT mid abdominal small bowel loop, uncertain significance. Electronically Signed   By: Elsie Stain M.D.   On: 11/13/2017 13:40    Assessment/Plan Principal Problem:   Ileus, unspecified  -We have been asked to evaluate patient with acute onset of nausea vomiting  and abdominal distention with x-ray concerning for ileus that potentially is evolving into small bowel obstruction -No prior abdominal surgeries -I have ordered NG tube placement to low wall suction; since my initial orders and evaluation of the patient surgery has also been consulted for the same issue and have initiated the small bowel obstruction order set/protocol -No abdominal pain on exam but will continue to monitor-no diarrhea or bleeding so doubt ischemic colitis -Significant abdominal distention and likely significant amount of third spacing of volume so will initiate IV fluids with potassium at 75/hr and follow intake and output closely -Do not recommend clamping NG tube for medications at this time  Active Problems:   Type 2 diabetes mellitus with complication, without long-term current use of insulin  -Follow CBGs every 4 hours while n.p.o. and provide SSI -Hold metformin    Stage III chronic kidney disease  -Renal function stable and at baseline    Sleep apnea/Obesity -Given placement of NG tube would not be able to utilize CPAP and utilize nasal cannula oxygen    HTN (hypertension), benign -Currently uncontrolled in context of abdominal distention and discomfort -I will order IV hydralazine prn based on parameters    ST elevation myocardial infarction involving left circumflex coronary artery  -Post 2 cardiac catheterizations with staged PCI's and stents x5 -I have changed low-dose aspirin to 150 mg per rectum -NPO with NG tube so unable to give Brilinta-discussed with cardiology extender who will discuss alternative medication with attending cardiologist    Acute on chronic combined systolic and diastolic CHF (congestive heart failure)  -Patient has marked abdominal distention and associated third spacing of volume and with NG tube placement and NG tube returns anticipate he may quickly become volume depleted and hypokalemic -I have started normal saline with KCl at  75/hr -Patient does have a EF of 40 to 45% so will need to be cautious with administration of IV fluid    **Additional lab, imaging and/or diagnostic evaluation at discretion of supervising physician     Russella Dar ANP-BC [facility] Pager [phone]   If 7PM-7AM, please contact night-coverage [email or web address] Password [facility]  11/13/2017, 4:57 PM

## 2017-11-13 NOTE — Progress Notes (Signed)
Found NGT is out & pt.said he pulled it out bec.he forgot about it.Reinserted a new NGT & ordered portable abd.xray per protocol to check for NGT placement

## 2017-11-13 NOTE — Progress Notes (Signed)
Attempted to see pt x2. Informed by RN pt still nauseated and vomiting and pt is down for abdominal xray. Will continue to follow.  Reynold Bowen, RN BSN 11/13/2017 1:37 PM

## 2017-11-13 NOTE — Consult Note (Signed)
[facility] Surgery Consult/Admission Note  [name] 1931-03-16  [id number].    Requesting MD: Fabian Sharp, PA- C Chief Complaint/Reason for Consult: ileus vs SBO  HPI:   Pt is a 84 yo male admitted for inferolateral STEMI S/P PCI yesterday with 95% stenosis in midRCA and 80% stenosis in the distal RCA, both treated with DES who is complaining of nausea and vomiting. Pt states he started to not feel well right after his PCI. He states vomiting this am. Last episode around 1230. Nurse states clear yellow/brown vomit. Pt states abdominal pain this am but none currently. He states scant flatus. No other complaints. KUB shows gaseous gastric distention and single prominent LEFT mid abdominal small bowel loop. Lactic acid and CBC pending.   ROS:  Review of Systems  Constitutional: Negative for chills, diaphoresis and fever.  HENT: Negative for sore throat.   Respiratory: Negative for cough and shortness of breath.   Cardiovascular: Negative for chest pain.  Gastrointestinal: Positive for abdominal pain, nausea and vomiting. Negative for blood in stool, constipation and diarrhea.  Skin: Negative for rash.  Neurological: Negative for dizziness and loss of consciousness.  All other systems reviewed and are negative.    Family History  Problem Relation Age of Onset  . Anesthesia problems Neg Hx     Past Medical History:  Diagnosis Date  . Arthritis    "all over" (11/12/2017)  . Chronic lower back pain   . CKD (chronic kidney disease), stage III ([place])   . Constipation    takes an OTC med as needed  . Diabetic nephropathy ([place])   . Diastolic dysfunction    a. 01/2015 Echo: EF 60-65%, no rwma, Gr1 DD, mildly dil LA, PaSP 28mHg.  . Gastric ulcer 1970s  . Glaucoma, both eyes    uses eye drops daily  . Gout    takes Allopurinol daily  . History of colon polyps   . History of stress test    a. 09/2014 MV: EF 53%, no ischemia/infarct.  . Hyperlipidemia    takes  Pravastatin daily  . Hypertension    takes Amlodipine and Losartan daily  . Insomnia    uses OTC sleep aide  . Joint pain   . Joint swelling    right knee   . Legally blind   . Macular degeneration   . Morbid obesity ([place]   . Nocturia   . Peripheral edema   . Pleurisy 20+yrs ago  . Pneumonia    "I think once when I was a kid" (11/12/2017)  . Sleep apnea   . STEMI (ST elevation myocardial infarction) ([place] 11/10/2017  . Stroke ([facility]    "I've had them; don't know anything about them"; denies residual on 11/12/2017)  . Syncope    a. 09/2014 in setting of orthostasis; b. 10/2014 Event monitor: 19 beat run of asymp NSVT; c. 01/2015 Echo: EF 60-65%, no rwma.  . Type II diabetes mellitus ([place]    takes Metformin daily  . Urinary frequency    takes Flomax daily    Past Surgical History:  Procedure Laterality Date  . BACK SURGERY    . CATARACT EXTRACTION W/ INTRAOCULAR LENS  IMPLANT, BILATERAL Bilateral   . COLONOSCOPY    . CORONARY ANGIOGRAPHY N/A 11/12/2017   Procedure: CORONARY ANGIOGRAPHY;  Surgeon: KTroy Sine MD;  [facility];  Service: Cardiovascular;  Laterality: N/A;  . CORONARY STENT INTERVENTION N/A 11/12/2017   Procedure: CORONARY STENT INTERVENTION;  Surgeon:  Troy Sine, MD;  Location: [place] [facility];  Service: Cardiovascular;  Laterality: N/A;  . CORONARY/GRAFT ACUTE MI REVASCULARIZATION N/A 11/10/2017   Procedure: Coronary/Graft Acute MI Revascularization;  Surgeon: Troy Sine, MD;  Location: [place] [facility];  Service: Cardiovascular;  Laterality: N/A;  . EYE SURGERY    . GLAUCOMA SURGERY Bilateral   . INSERTION / PLACEMENT / REVISION NEUROSTIMULATOR  2000s  . KNEE ARTHROSCOPY Right 1990s  . LEFT HEART CATH AND CORONARY ANGIOGRAPHY N/A 11/10/2017   Procedure: LEFT HEART CATH AND CORONARY ANGIOGRAPHY;  Surgeon: Troy Sine, MD;  Location: [place] [facility];  Service: Cardiovascular;  Laterality: N/A;  . MINI SHUNT INSERTION   09/26/2011   Procedure: INSERTION OF MINI SHUNT;  Surgeon: Marylynn Pearson, MD;  Location: [place];  Service: Ophthalmology;  Laterality: Right;  Insertion of Ahmed shunt  . POSTERIOR FUSION LUMBAR SPINE  1990s  . REVERSE SHOULDER ARTHROPLASTY Right 09/21/2013   Procedure: RIGHT  SHOULDER ARTHROPLASTY REVERSE ;  Surgeon: Augustin Schooling, MD;  Location: [place];  Service: Orthopedics;  Laterality: Right;  . SHOULDER INJECTION Left 09/21/2013   Procedure: SHOULDER INJECTION;  Surgeon: Augustin Schooling, MD;  Location: [place];  Service: Orthopedics;  Laterality: Left;    Social History:  reports that he has quit smoking. His smoking use included cigarettes. He smoked 2.00 packs per day. He has never used smokeless tobacco. He reports that he drinks alcohol. He reports that he does not use drugs.  Allergies: No Known Allergies  Medications Prior to Admission  Medication Sig Dispense Refill  . allopurinol (ZYLOPRIM) 300 MG tablet Take 300 mg by mouth daily.    . furosemide (LASIX) 20 MG tablet Take 20 mg by mouth daily.    Marland Kitchen loperamide (IMODIUM) 2 MG capsule Take 2 mg by mouth as needed for diarrhea or loose stools.    Marland Kitchen losartan (COZAAR) 50 MG tablet Take 50 mg by mouth daily.    . metFORMIN (GLUCOPHAGE) 1000 MG tablet Take 2,000 mg by mouth at bedtime.     . pravastatin (PRAVACHOL) 40 MG tablet Take 40 mg by mouth daily.    . prednisoLONE acetate (PRED FORTE) 1 % ophthalmic suspension Place 1 drop into the right eye at bedtime.    . sertraline (ZOLOFT) 50 MG tablet Take 50 mg by mouth at bedtime.    . Tamsulosin HCl (FLOMAX) 0.4 MG CAPS Take 0.4 mg by mouth daily.    . timolol (TIMOPTIC) 0.5 % ophthalmic solution Place 1 drop into the right eye daily.    . TRAVATAN Z 0.004 % SOLN ophthalmic solution Place 1 drop into the left eye at bedtime.   6    Blood pressure (!) 181/107, pulse 97, temperature 98.5 F (36.9 C), temperature source Oral, resp. rate (!) 21, height _0  (1.778 m), weight 108.9 kg (240  lb 1.3 oz), SpO2 97 %.  Physical Exam  Constitutional: He is oriented to person, place, and time. He appears well-developed and well-nourished. No distress.  HENT:  Head: Normocephalic and atraumatic.  Nose: Nose normal.  Mouth/Throat: Oropharynx is clear and moist and mucous membranes are normal. No oropharyngeal exudate.  Eyes: Pupils are equal, round, and reactive to light. Conjunctivae and lids are normal. Right eye exhibits no discharge. Left eye exhibits no discharge. No scleral icterus.  Neck: Normal range of motion. Neck supple. No thyromegaly present.  Cardiovascular: Normal rate, regular rhythm, normal heart sounds and intact distal pulses.  No murmur heard. Pulses:      Radial pulses are 2+ on the right side, and 2+ on the left side.       Dorsalis pedis pulses are 1+ on the right side, and 1+ on the left side.  Pulmonary/Chest: Effort normal. No respiratory distress.  Abdominal: Soft. Normal appearance and bowel sounds are normal. He exhibits distension (mild). There is no hepatosplenomegaly. There is no tenderness. There is no rigidity and no guarding.  No guarding, no signs of peritonitis, no acute abdomen  Musculoskeletal: Normal range of motion. He exhibits no edema, tenderness or deformity.  Lymphadenopathy:    He has no cervical adenopathy.  Neurological: He is alert and oriented to person, place, and time.  Skin: Skin is warm and dry. No rash noted. He is not diaphoretic.  Psychiatric: He has a normal mood and affect.  Nursing note and vitals reviewed.   Results for orders placed or performed during the hospital encounter of 11/10/17 (from the past 48 hour(s))  Glucose, capillary     Status: Abnormal   Collection Time: 11/11/17  4:28 PM  Result Value Ref Range   Glucose-Capillary 119 (H) 65 - 99 mg/dL  Glucose, capillary     Status: Abnormal   Collection Time: 11/12/17  8:06 AM  Result Value Ref Range   Glucose-Capillary 127 (H) 65 - 99 mg/dL  POCT Activated  clotting time     Status: None   Collection Time: 11/12/17  9:45 AM  Result Value Ref Range   Activated Clotting Time 549 seconds  Glucose, capillary     Status: Abnormal   Collection Time: 11/12/17 11:30 AM  Result Value Ref Range   Glucose-Capillary 140 (H) 65 - 99 mg/dL  Troponin I (serum)     Status: Abnormal   Collection Time: 11/12/17  2:36 PM  Result Value Ref Range   Troponin I 23.16 (HH) <0.03 ng/mL    Comment: CRITICAL VALUE NOTED.  VALUE IS CONSISTENT WITH PREVIOUSLY REPORTED AND CALLED VALUE. Performed at [facility], [place] [address], [place], [place] [address]   Glucose, capillary     Status: Abnormal   Collection Time: 11/12/17  5:29 PM  Result Value Ref Range   Glucose-Capillary 156 (H) 65 - 99 mg/dL   Comment 1 Notify RN    Comment 2 Document in Chart   Glucose, capillary     Status: Abnormal   Collection Time: 11/12/17  9:34 PM  Result Value Ref Range   Glucose-Capillary 112 (H) 65 - 99 mg/dL  Basic metabolic panel     Status: Abnormal   Collection Time: 11/13/17  2:45 AM  Result Value Ref Range   Sodium 141 135 - 145 mmol/L   Potassium 3.6 3.5 - 5.1 mmol/L   Chloride 107 101 - 111 mmol/L   CO2 25 22 - 32 mmol/L   Glucose, Bld 163 (H) 65 - 99 mg/dL   BUN 9 6 - 20 mg/dL   Creatinine, Ser 1.26 (H) 0.61 - 1.24 mg/dL   Calcium 8.9 8.9 - 10.3 mg/dL   GFR calc non Af Amer 51 (L) >60 mL/min   GFR calc Af Amer 59 (L) >60 mL/min    Comment: (NOTE) The eGFR has been calculated using the CKD EPI equation. This calculation has not been validated in all clinical situations. eGFR's persistently <60 mL/min signify possible Chronic Kidney Disease.    Anion gap 9 5 - 15    Comment: Performed at [facility], [address] [place], [place] [address]  CBC     Status: Abnormal   Collection Time: 11/13/17  2:45 AM  Result Value Ref Range   WBC 7.0 4.0 - 10.5 K/uL   RBC 4.20 (L) 4.22 - 5.81 MIL/uL   Hemoglobin 12.0 (L) 13.0 - 17.0 g/dL   HCT 38.2 (L) 39.0  - 52.0 %   MCV 91.0 78.0 - 100.0 fL   MCH 28.6 26.0 - 34.0 pg   MCHC 31.4 30.0 - 36.0 g/dL   RDW 13.6 11.5 - 15.5 %   Platelets 166 150 - 400 K/uL    Comment: Performed at [facility], [place] [address], [place], [place] [address]  Glucose, capillary     Status: Abnormal   Collection Time: 11/13/17  6:07 AM  Result Value Ref Range   Glucose-Capillary 142 (H) 65 - 99 mg/dL   Comment 1 Notify RN    Comment 2 Document in Chart   Glucose, capillary     Status: Abnormal   Collection Time: 11/13/17 12:07 PM  Result Value Ref Range   Glucose-Capillary 138 (H) 65 - 99 mg/dL   Comment 1 Notify RN    Comment 2 Document in Chart    Dg Abd 1 View  Result Date: 11/13/2017 CLINICAL DATA:  Abdomen pains. EXAM: ABDOMEN - 1 VIEW COMPARISON:  None. FINDINGS: The entire abdomen could not be visualized on a single AP radiograph, due to body habitus, therefore high and low exposures were performed. Despite this, portions of the RIGHT abdomen are not visualized in their entirety. Gaseous distended stomach. Prominent LEFT mid abdominal small bowel loop could represent ileus or partial obstruction. Colonic gas is present all the way to the rectosigmoid region. A spinal cord stimulator overlies the lower thoracic region with the battery pack posteriorly on the RIGHT. Lumbar spondylosis. IMPRESSION: AP supine radiographs. Gaseous gastric distention. Single prominent LEFT mid abdominal small bowel loop, uncertain significance. Electronically Signed   By: Staci Righter M.D.   On: 11/13/2017 13:40      Assessment/Plan Principal Problem:   Ileus, unspecified ([place]) Active Problems:   Type 2 diabetes mellitus with complication, without long-term current use of insulin (HCC)   HTN (hypertension), benign   ST elevation myocardial infarction involving left circumflex coronary artery (HCC)   Acute ST elevation myocardial infarction (STEMI) of inferolateral wall (HCC)   Stage III chronic kidney disease (HCC)    Acute on chronic combined systolic and diastolic CHF (congestive heart failure) (HCC)   Sleep apnea   Obesity  Ileus vs SBO - likely ileus in setting of PCI yesterday and no hx of abdominal surgeries - SBO protocol with NGT placement and gastrografin, 8hr delay film - lactic acid pending, pt does not have an acute abdomen on exam so ischemic bowel less likely.   Recommend NPO, sips with meds okay  Thank you for the consult, we will follow  Kalman Drape, [facility] 11/13/2017, 4:15 PM Pager: [phone] Consults: [phone] Mon-Fri 7:00 am-4:30 pm Sat-Sun 7:00 am-11:30 am

## 2017-11-13 NOTE — Care Management Note (Signed)
Case Management Note  Patient Details  Name: [name] MRN: [id number] Date of Birth: 29-Jul-1930  Subjective/Objective:  STEMI, Multivessel CAD, CHF                Action/Plan: Spoke to pt and not feeling well. Contacted dtr, [name] # [phone]. States he had [facility] in the past. He has RW and bedside commode at home. He has a friend that lives with him but he able to assist is limited due to working full-time. Explained to dtr waiting for PT eval. She is agreeable to SNF for pt if recommended. Contacted [facility] with new referral. NCM will continue to follow for dc. Provided pt with Brilinta 30 day free trial card.   #  4.  S/W AMBER @ [employer] [facility] # [phone]    BRILINTA 90 MG BID  COVER- YES  CO-PAY- $ 47.00  TIER- 3 DRUG  PRIOR APPROVAL- NO   PREFERRED PHARMACY : YES  [facility] AND [facility]   Expected Discharge Date:                 Expected Discharge Plan:  Home w Home Health Services  In-House Referral:  NA  Discharge planning Services  CM Consult  Post Acute Care Choice:  Home Health Choice offered to:  Adult Children  DME Arranged:  N/A DME Agency:  NA  HH Arranged:    HH Agency:  [facility]  Status of Service:  In process, will continue to follow  If discussed at Long Length of Stay Meetings, dates discussed:    Additional Comments:  Elliot Cousin, RN 11/13/2017, 2:13 PM

## 2017-11-13 NOTE — Progress Notes (Signed)
Called to bedside for yellow/bile vomit. Zofran did not relieve nausea this morning. Patient has essentially been NPO today. Abdomen is distended. Will order a KUB to evaluate possible ileus. Keep NPO for now.

## 2017-11-13 NOTE — Progress Notes (Addendum)
The patient has been seen in conjunction with Angie Duke, PA-C. All aspects of care have been considered and discussed. The patient has been personally interviewed, examined, and all clinical data has been reviewed.   Hemodynamically stable but with nausea and vomiting.  KUB is pending.  BUN and creatinine are stable.  ECG at 237 this morning reveal right bundle with sinus tachycardia.  May need to give IV Lasix if jugular vein distention as nausea and vomiting may be related to volume overload and hepatic engorgement.   Progress Note  Patient Name: [name] Date of Encounter: 11/13/2017  Primary Cardiologist: Thurmon Fair, MD   Subjective   Pt denies chest pain overnight. Groin site with bruising, no hematoma. Pt is quite nauseous this morning and reports SOB.   Inpatient Medications    Scheduled Meds: . allopurinol  300 mg Oral Daily  . amLODipine  5 mg Oral Daily  . aspirin  81 mg Oral Daily  . atorvastatin  80 mg Oral q1800  . carvedilol  3.125 mg Oral BID WC  . heparin  5,000 Units Subcutaneous Q8H  . insulin aspart  0-15 Units Subcutaneous TID WC  . latanoprost  1 drop Both Eyes QHS  . mouth rinse  15 mL Mouth Rinse BID  . sodium chloride flush  3 mL Intravenous Q12H  . tamsulosin  0.4 mg Oral Daily  . ticagrelor  90 mg Oral BID   Continuous Infusions: . sodium chloride 125 mL/hr at 11/12/17 1230  . sodium chloride     PRN Meds: sodium chloride, acetaminophen, diazepam, nitroGLYCERIN, ondansetron (ZOFRAN) IV, sodium chloride flush   Vital Signs    Vitals:   11/12/17 1800 11/12/17 1947 11/13/17 0242 11/13/17 0717  BP: (!) 125/93 136/76 (!) 171/98 (!) 160/99  Pulse: 87 96 96 94  Resp: (!) 22 14 (!) 21 (!) 25  Temp:   99.1 F (37.3 C) 98.4 F (36.9 C)  TempSrc:  Oral Oral   SpO2: 96% 94% 99% 96%  Weight:   240 lb 1.3 oz (108.9 kg)   Height:        Intake/Output Summary (Last 24 hours) at 11/13/2017 0756 Last data filed at 11/13/2017  0247 Gross per 24 hour  Intake 1052.5 ml  Output 1375 ml  Net -322.5 ml   Filed Weights   11/11/17 0357 11/12/17 0500 11/13/17 0242  Weight: 239 lb 3.2 oz (108.5 kg) 238 lb 8 oz (108.2 kg) 240 lb 1.3 oz (108.9 kg)    Telemetry    Sinus  - Personally Reviewed  ECG    First degree heart block, RBBB, LAFB, no new changes - Personally Reviewed  Physical Exam   GEN: No acute distress.   Neck: No JVD, exam difficult Cardiac: RRR, no murmurs, rubs, or gallops.  Respiratory: respirations unlabored GI: Soft, nontender, non-distended  MS: No edema; No deformity. Neuro:  Nonfocal  Psych: Normal affect   Labs    Chemistry Recent Labs  Lab 11/10/17 1100 11/10/17 1111 11/11/17 0217 11/13/17 0245  NA 139 142 138 141  K 3.4* 3.4* 3.9 3.6  CL 106 104 104 107  CO2 24  --  23 25  GLUCOSE 144* 147* 121* 163*  BUN 21* 22* 17 9  CREATININE 1.55* 1.40* 1.31* 1.26*  CALCIUM 8.9  --  8.7* 8.9  PROT 6.9  --  6.8  --   ALBUMIN 3.5  --  3.4*  --   AST 16  --  204*  --   ALT 12*  --  45  --   ALKPHOS 61  --  58  --   BILITOT 0.8  --  0.8  --   GFRNONAA 39*  --  48* 51*  GFRAA 46*  --  56* 59*  ANIONGAP 9  --  11 9     Hematology Recent Labs  Lab 11/10/17 1100 11/10/17 1111 11/11/17 0217 11/13/17 0245  WBC 4.9  --  6.8 7.0  RBC 4.86  --  4.48 4.20*  HGB 13.8 14.3 12.9* 12.0*  HCT 42.9 42.0 40.5 38.2*  MCV 88.3  --  90.4 91.0  MCH 28.4  --  28.8 28.6  MCHC 32.2  --  31.9 31.4  RDW 13.2  --  13.4 13.6  PLT 197  --  168 166    Cardiac Enzymes Recent Labs  Lab 11/10/17 1500 11/11/17 0217 11/12/17 1436  TROPONINI 51.42* >65.00* 23.16*    Recent Labs  Lab 11/10/17 1109  TROPIPOC 0.03     BNPNo results for input(s): BNP, PROBNP in the last 168 hours.   DDimer No results for input(s): DDIMER in the last 168 hours.   Radiology    No results found.  Cardiac Studies   Echo 11/11/17: Study Conclusions - Left ventricle: The cavity size was normal. There was  moderate concentric hypertrophy. Systolic function was mildly to moderately reduced. The estimated ejection fraction was in the range of 40% to 45%. Doppler parameters are consistent with abnormal left ventricular relaxation (grade 1 diastolic dysfunction). There was no evidence of elevated ventricular filling pressure by Doppler parameters. - Aortic valve: Valve area (VTI): 2.08 cm^2. Valve area (Vmax): 2.13 cm^2. Valve area (Vmean): 1.98 cm^2. - Aortic root: The aortic root was normal in size. - Mitral valve: Valve area by pressure half-time: 1.28 cm^2. - Left atrium: The atrium was normal in size. - Right ventricle: The cavity size was normal. Wall thickness was normal. Systolic function was normal. - Right atrium: The atrium was normal in size. - Tricuspid valve: There was no regurgitation. - Pulmonary arteries: Systolic pressure could not be accurately estimated. - Inferior vena cava: The vessel was normal in size. - Pericardium, extracardiac: There was no pericardial effusion.  Impressions:  - There is akinesis of the basal and mid inferior, inefrolateral and apical inferior leads.   Left heart cath 11/10/17:  Mid RCA lesion is 95% stenosed.  Dist RCA lesion is 80% stenosed.  Prox Cx to Mid Cx lesion is 90% stenosed.  Ost 2nd Mrg to 2nd Mrg lesion is 100% stenosed.  2nd Mrg lesion is 95% stenosed.  1st Diag lesion is 95% stenosed.  Mid LAD lesion is 80% stenosed.  Dist LAD lesion is 90% stenosed.  Post intervention, there is a 0% residual stenosis.  Post intervention, there is a 0% residual stenosis.  Post intervention, there is a 0% residual stenosis.  A stent was successfully placed.  A stent was successfully placed.  A stent was successfully placed.  Acute inferolateral ST segment elevation myocardial infarction secondary to total occlusion of the circumflex vessel.  Multivessel CAD with 30% proximal and 95% distal diagonal 1  stenoses of the LAD, 80% mid distal and 90% distal LAD stenoses, and large dominant RCA with 95% mid stenosis and 80% distal stenosis prior to the PDA takeoff.   Successful complex percutaneous coronary intervention involving the proximal circumflex, proximal large OM vessel and distal inferior branch of this vessel after a large  additional branch which cannot be visualized on the diagram above. A 2.0 x 12 mm Resolute Onyx DES stent was placed in the distal inferior secondary branch of the marginal vessel and reduced to 0%; the site of 100% occlusion was treated with PTCA/DES stenting with a 2.5 x 18 mm Resolute DES stent positioned just proximal to the more distal bifurcation into a large branch and distal inferior branch with the 100% occlusion being reduced to 0% with post dilatation up to 3.0 mm; and the proximal 90% stenosis on a sharp bend of the vessel being reduced to 0% with ultimate insertion of a 3.0 x 18 mm Resolute stent postdilated to 3.34 mm.  LVEDP 24 mm Hg.  RECOMMENDATION: DAPT therapy for minimum of 1 year, and probably indefinitely. Once the patient blood pressure is stable we will initiate beta-blocker and ACE/arm therapy. A 2D echo Doppler study will be done to assess LV function. The patient will be hydrated post procedure. On Tuesday, November 12, 2017 if renal function is stable, plan stageD PCI to the mid and distal RCA with relook at the left system.   Diagnostic Diagram       Post-Intervention Diagram         Staged PCI 11/12/17:  1st Diag lesion is 95% stenosed.  Mid LAD lesion is 70% stenosed.  Dist LAD lesion is 85% stenosed.  Previously placed 2nd Mrg stent (unknown type) is widely patent.  Previously placed Ost 2nd Mrg to 2nd Mrg stent (unknown type) is widely patent.  Previously placed Prox Cx to Mid Cx stent (unknown type) is widely patent.  Dist RCA lesion is 80% stenosed.  Post intervention, there is a 0% residual stenosis.  Prox  RCA to Mid RCA lesion is 30% stenosed.  Mid RCA lesion is 95% stenosed.  A stent was successfully placed.  Post intervention, there is a 0% residual stenosis.  Post intervention, there is a 0% residual stenosis.  A stent was successfully placed.   Re-look of the left coronary circulation revealed widely patent stents extending from the proximal circumflex into the marginal vessel and also into the distal branch without restenosis.  The LAD had previously demonstrated 95% distal stenosis in a small caliber diagonal vessel, and the mid distal to distal LAD lesions appeared slightly improved now 70 and 85%.  Successful PCI to the RCA with DES stenting of the distal 80% stenosis reduced to 0% with a 2.25 x 12 mm Resolute Onyx stent postdilated to 2.4 mm, and the mid RCA stenosis of 95% with proximal eccentric narrowing stented with a 3.0 x 26 mm Resolute stent postdilated to 3.36 tapering to 3.25 with the entire stenosis being reduced to 0%.  RECOMMENDATION: DAPT for minimum of 1 year.  The patient will be started on carvedilol in addition to amlodipine in light of his hypertension as well as concomitant CAD.  He is on high potency statin therapy.   Diagnostic Diagram       Post-Intervention Diagram          Patient Profile     84 y.o. male with a history of hypertension, type 2 diabetes mellitus, hyperlipidemia, diastolic dysfunction, prior history of syncope, orthostatic hypotension, CKD stage III secondary to diabetes and hypertension who presented with acute inferolateral MI on 11/10/2017 due to total occlusion of the circumflex in the setting of multivessel CAD. Complex interventional circumflex was completed with reperfusion. Residual significant RCA disease (needs PCI) and distal LAD disease (medical therapy recommended).  Assessment &  Plan    1.  Inferolateral STEMI, multivessel disease - total occlusion of the Cx with DES x 2 to proximal large OM and distal inferior  branch of this OM and distal inferior branch of this OM (11/10/17) - stage PCI yesterday with 95% stenosis in midRCA and 80% stenosis in the distal RCA, both treated with DES - pt tolerated the procedure well, groin site with bruising but no hematoma - continue on ASA and brilinta   2. Acute on chronic kidney disease stage III - hydration pre and post cath - sCr this morning 1.26 - baseline appears to be 1.1-1.2 - making good urine    3. Acute on chronic combined systolic and diastolic heart failure - echo this admission with LVEF 40-45% and grade 1 DD - pt tolerated hydration overnight - kidney function has normalized - pt is short of breath - will discuss with attending utility of IV lasix this morning - will start home lasix at discharge   4. Disposition - will need to make sure nausea resolves prior to discharge - 2-person assist, blind and uses a walker - PT has been consulted   For questions or updates, please contact [facility] Please consult [email or web address] for contact info under Cardiology/STEMI.      Signed, Roe Rutherford Duke, PA  11/13/2017, 7:56 AM

## 2017-11-14 ENCOUNTER — Inpatient Hospital Stay (HOSPITAL_COMMUNITY): Payer: Medicare HMO

## 2017-11-14 DIAGNOSIS — I213 ST elevation (STEMI) myocardial infarction of unspecified site: Secondary | ICD-10-CM

## 2017-11-14 DIAGNOSIS — K567 Ileus, unspecified: Secondary | ICD-10-CM

## 2017-11-14 DIAGNOSIS — R1114 Bilious vomiting: Secondary | ICD-10-CM

## 2017-11-14 LAB — COMPREHENSIVE METABOLIC PANEL
ALT: 22 U/L (ref 17–63)
AST: 35 U/L (ref 15–41)
Albumin: 3.3 g/dL — ABNORMAL LOW (ref 3.5–5.0)
Alkaline Phosphatase: 50 U/L (ref 38–126)
Anion gap: 11 (ref 5–15)
BUN: 11 mg/dL (ref 6–20)
CO2: 27 mmol/L (ref 22–32)
Calcium: 8.8 mg/dL — ABNORMAL LOW (ref 8.9–10.3)
Chloride: 105 mmol/L (ref 101–111)
Creatinine, Ser: 1.32 mg/dL — ABNORMAL HIGH (ref 0.61–1.24)
GFR calc Af Amer: 55 mL/min — ABNORMAL LOW (ref >60)
GFR calc non Af Amer: 48 mL/min — ABNORMAL LOW (ref >60)
Glucose, Bld: 114 mg/dL — ABNORMAL HIGH (ref 65–99)
Potassium: 3.4 mmol/L — ABNORMAL LOW (ref 3.5–5.1)
Sodium: 143 mmol/L (ref 135–145)
Total Bilirubin: 0.9 mg/dL (ref 0.3–1.2)
Total Protein: 6.6 g/dL (ref 6.5–8.1)

## 2017-11-14 LAB — GLUCOSE, CAPILLARY
Glucose-Capillary: 118 mg/dL — ABNORMAL HIGH (ref 65–99)
Glucose-Capillary: 195 mg/dL — ABNORMAL HIGH (ref 65–99)
Glucose-Capillary: 91 mg/dL (ref 65–99)
Glucose-Capillary: 125 mg/dL — ABNORMAL HIGH (ref 65–99)

## 2017-11-14 MED ORDER — SODIUM CHLORIDE 0.45 % IV SOLN
INTRAVENOUS | Status: AC
  Administered 2017-11-14: 10:00:00 via INTRAVENOUS
  Filled 2017-11-14: qty 1000

## 2017-11-14 NOTE — Evaluation (Signed)
Physical Therapy Evaluation Patient Details Name: [name] MRN: [id number] DOB: [birth year] Today's Date: 11/14/2017   History of Present Illness  Pt adm 6/9 for STEMI and underwent staged PCI. Pt's recovery complicated by ileus with NG tube placed. NG tube removed 6/13. 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 Pt needs skilled PT to maximize independence and safety to allow discharge to ST-SNF for further rehab before return home with intermittent support.      Follow Up Recommendations SNF;Supervision/Assistance - 24 hour    Equipment Recommendations  None recommended by PT    Recommendations for Other Services       Precautions / Restrictions Precautions Precautions: Fall;Other (comment) Precaution Comments: blind Restrictions Weight Bearing Restrictions: No      Mobility  Bed Mobility Overal bed mobility: Needs Assistance Bed Mobility: Supine to Sit     Supine to sit: Mod assist     General bed mobility comments: Assist to bring legs off of bed, elevate trunk into sitting and bring hips to EOB.  Transfers Overall transfer level: Needs assistance Equipment used: Rolling walker (2 wheeled) Transfers: Sit to/from Stand Sit to Stand: Min assist         General transfer comment: Assist to bring hips up and for balance  Ambulation/Gait Ambulation/Gait assistance: Min assist Gait Distance (Feet): 70 Feet Assistive device: Rolling walker (2 wheeled) Gait Pattern/deviations: Step-through pattern;Decreased stride length;Trunk flexed Gait velocity: decr Gait velocity interpretation: <1.31 ft/sec, indicative of household ambulator General Gait Details: Assist for balance and support. Assist with guidance of walker due to blindness  Stairs             Wheelchair Mobility    Modified Rankin (Stroke Patients Only)       Balance Overall balance assessment: Needs assistance Sitting-balance support: No upper extremity supported;Feet supported Sitting balance-Leahy Scale: Fair     Standing balance support: Bilateral upper extremity supported Standing balance-Leahy Scale: Poor Standing balance comment: walker and supervision for static standing                             Pertinent Vitals/Pain Pain Assessment: No/denies pain    Home Living Family/patient expects to be discharged to:: Skilled nursing facility Living Arrangements: Children("son") Available Help at Discharge: Family;Available PRN/intermittently Type of Home: House Home Access: Stairs to enter Entrance Stairs-Rails: Right Entrance Stairs-Number of Steps: 3 Home Layout: One level Home Equipment: Walker - 4 wheels;Cane - single point;Shower seat;Grab bars - toilet;Grab bars - tub/shower;Wheelchair - manual      Prior Function Level of Independence: Needs assistance   Gait / Transfers Assistance Needed: amb with cane or rollator           Hand Dominance   Dominant Hand: Right    Extremity/Trunk Assessment   Upper Extremity Assessment Upper Extremity Assessment: Generalized weakness    Lower Extremity Assessment Lower Extremity Assessment: Generalized weakness       Communication   Communication: No difficulties  Cognition Arousal/Alertness: Awake/alert Behavior During Therapy: WFL for tasks assessed/performed Overall Cognitive Status: Within Functional Limits for tasks assessed  General Comments      Exercises     Assessment/Plan    PT Assessment Patient needs continued PT services  PT Problem List Decreased strength;Decreased activity tolerance;Decreased balance;Decreased mobility;Obesity       PT Treatment Interventions DME instruction;Gait training;Functional  mobility training;Therapeutic activities;Therapeutic exercise;Balance training;Patient/family education    PT Goals (Current goals can be found in the Care Plan section)  Acute Rehab PT Goals Patient Stated Goal: return home PT Goal Formulation: With patient Time For Goal Achievement: 11/28/17 Potential to Achieve Goals: Good    Frequency Min 3X/week   Barriers to discharge Decreased caregiver support Does not have 24 hour assist     Co-evaluation               AM-PAC PT "6 Clicks" Daily Activity  Outcome Measure Difficulty turning over in bed (including adjusting bedclothes, sheets and blankets)?: Unable Difficulty moving from lying on back to sitting on the side of the bed? : Unable Difficulty sitting down on and standing up from a chair with arms (e.g., wheelchair, bedside commode, etc,.)?: Unable Help needed moving to and from a bed to chair (including a wheelchair)?: A Lot Help needed walking in hospital room?: A Little Help needed climbing 3-5 steps with a railing? : Total 6 Click Score: 9    End of Session Equipment Utilized During Treatment: Gait belt Activity Tolerance: Patient tolerated treatment well Patient left: in chair;with call bell/phone within reach Nurse Communication: Mobility status PT Visit Diagnosis: Unsteadiness on feet (R26.81);Other abnormalities of gait and mobility (R26.89)    Time: 5208-0223 PT Time Calculation (min) (ACUTE ONLY): 28 min   Charges:   PT Evaluation $PT Eval Moderate Complexity: 1 Mod PT Treatments $Gait Training: 8-22 mins   PT G Codes:        [facility] PT [phone]   Angelina Ok [facility] 11/14/2017, 3:03 PM

## 2017-11-14 NOTE — Progress Notes (Signed)
Patient ID: [name], male   DOB: March 16, 1931, 84 y.o.   MRN: [id number]    2 Days Post-Op  Subjective: Pt states he feels better this morning.  Passing flatus, but no BM yet.  650 out of NGT since placement.  No further output from mark on cannister this morning.  Objective: Vital signs in last 24 hours: Temp:  [98.3 F (36.8 C)-99 F (37.2 C)] 99 F (37.2 C) (06/13 0505) Pulse Rate:  [88-99] 91 (06/13 0828) Resp:  [14-24] 14 (06/13 0828) BP: (136-181)/(90-107) 140/90 (06/13 0828) SpO2:  [95 %-97 %] 96 % (06/13 0828) Weight:  [109.1 kg (240 lb 8.4 oz)] 109.1 kg (240 lb 8.4 oz) (06/13 0505) Last BM Date: 11/12/17  Intake/Output from previous day: 06/12 0701 - 06/13 0700 In: 543.4 [I.V.:543.4] Out: 1200 [Urine:550; Emesis/NG output:650] Intake/Output this shift: No intake/output data recorded.  PE: Abd: soft, obese, +BS, NGT with some bilious output in his cannister but not past the marked line from this am.    Lab Results:  Recent Labs    11/13/17 0245 11/13/17 1604  WBC 7.0 5.9  HGB 12.0* 12.0*  HCT 38.2* 37.6*  PLT 166 197   BMET Recent Labs    11/13/17 0245 11/14/17 0455  NA 141 143  K 3.6 3.4*  CL 107 105  CO2 25 27  GLUCOSE 163* 114*  BUN 9 11  CREATININE 1.26* 1.32*  CALCIUM 8.9 8.8*   PT/INR No results for input(s): LABPROT, INR in the last 72 hours. CMP     Component Value Date/Time   NA 143 11/14/2017 0455   K 3.4 (L) 11/14/2017 0455   CL 105 11/14/2017 0455   CO2 27 11/14/2017 0455   GLUCOSE 114 (H) 11/14/2017 0455   BUN 11 11/14/2017 0455   CREATININE 1.32 (H) 11/14/2017 0455   CALCIUM 8.8 (L) 11/14/2017 0455   PROT 6.6 11/14/2017 0455   ALBUMIN 3.3 (L) 11/14/2017 0455   AST 35 11/14/2017 0455   ALT 22 11/14/2017 0455   ALKPHOS 50 11/14/2017 0455   BILITOT 0.9 11/14/2017 0455   GFRNONAA 48 (L) 11/14/2017 0455   GFRAA 55 (L) 11/14/2017 0455   Lipase     Component Value Date/Time   LIPASE 42 11/13/2017 1555        Studies/Results: Dg Abd 1 View  Result Date: 11/13/2017 CLINICAL DATA:  Abdomen pains. EXAM: ABDOMEN - 1 VIEW COMPARISON:  None. FINDINGS: The entire abdomen could not be visualized on a single AP radiograph, due to body habitus, therefore high and low exposures were performed. Despite this, portions of the RIGHT abdomen are not visualized in their entirety. Gaseous distended stomach. Prominent LEFT mid abdominal small bowel loop could represent ileus or partial obstruction. Colonic gas is present all the way to the rectosigmoid region. A spinal cord stimulator overlies the lower thoracic region with the battery pack posteriorly on the RIGHT. Lumbar spondylosis. IMPRESSION: AP supine radiographs. Gaseous gastric distention. Single prominent LEFT mid abdominal small bowel loop, uncertain significance. Electronically Signed   By: Elsie Stain M.D.   On: 11/13/2017 13:40   Dg Abd Portable 1v-small Bowel Obstruction Protocol-initial, 8 Hr Delay  Result Date: 11/14/2017 CLINICAL DATA:  Small bowel obstruction.  Delayed film. EXAM: PORTABLE ABDOMEN - 1 VIEW COMPARISON:  Radiograph yesterday 2219 hour FINDINGS: Administered enteric contrast is visualized throughout the ascending, transverse, and descending and rectosigmoid colon. No small bowel dilatation. Enteric tube tip and side-port below the diaphragm in the stomach. Some  residual high-density contrast remains in the gastric lumen. IMPRESSION: Administered enteric contrast seen throughout the entire colon. Electronically Signed   By: Rubye Oaks M.D.   On: 11/14/2017 04:32   Dg Abd Portable 1v  Result Date: 11/13/2017 CLINICAL DATA:  Nasogastric tube placement EXAM: PORTABLE ABDOMEN - 1 VIEW COMPARISON:  11/13/2017 FINDINGS: Nasogastric tube side port is within the stomach. The tip is near the gastroduodenal junction. The examination is otherwise unchanged. IMPRESSION: NG tube tip at the gastroduodenal junction and side port within the  stomach. Electronically Signed   By: Deatra Robinson M.D.   On: 11/13/2017 22:33   Dg Abd Portable 1v-small Bowel Protocol-position Verification  Result Date: 11/13/2017 CLINICAL DATA:  Evaluate placement of NG tube. EXAM: PORTABLE ABDOMEN - 1 VIEW COMPARISON:  11/13/2017 FINDINGS: The nasogastric tube tip projects over the body of the stomach. There is contrast opacification of the stomach. Assuming the contrast was administered via the NG tube at the time this radiograph was obtained this confirms intraluminal placement. IMPRESSION: 1. Tip of NG tube projects over the stomach. 2. Contrast opacification of the gastric lumen which, assuming was administered via the NG tube at the time this radiograph was obtained, confirms intraluminal placement Electronically Signed   By: Signa Kell M.D.   On: 11/13/2017 20:34    Anti-infectives: Anti-infectives (From admission, onward)   None       Assessment/Plan Ileus likely secondary to recent MI  -films today show contrast in his colon.  His stomach is more decompressed.  Will DC NGT and trial clear liquids and see how he does.  Type 2 diabetes mellitus with complication, without long-term current use of insulin (HCC)   HTN (hypertension), benign   ST elevation myocardial infarction involving left circumflex coronary artery (HCC)   Acute ST elevation myocardial infarction (STEMI) of inferolateral wall (HCC)   Stage III chronic kidney disease (HCC)   Acute on chronic combined systolic and diastolic CHF (congestive heart failure) (HCC)   Sleep apnea   Obesity  FEN - clear liquids VTE - ASA, heparin ID - none   LOS: 4 days    Letha Cape , [facility] Surgery 11/14/2017, 9:52 AM Pager: [phone]

## 2017-11-14 NOTE — [place] FL2 (Signed)
[place] MEDICAID FL2 LEVEL OF CARE SCREENING TOOL     IDENTIFICATION  Patient Name: [name] Birthdate: 03-21-1931 Sex: male Admission Date (Current Location): 11/10/2017  [facility] and [place] Number:  Producer, television/film/video and Address:  [facility], [address], [place], [place] [address]      Provider Number: [id number]  Attending Physician Name and Address:  Lennette Bihari, MD  Relative Name and Phone Number:  [name], daughter, [phone]    Current Level of Care: Hospital Recommended Level of Care: Skilled Nursing Facility Prior Approval Number:    Date Approved/Denied:   PASRR Number: [id number]  Discharge Plan: SNF    Current Diagnoses: Patient Active Problem List   Diagnosis Date Noted  . Bilious vomiting   . Ileus, unspecified (HCC) 11/13/2017  . Sleep apnea 11/13/2017  . Obesity 11/13/2017  . Stage III chronic kidney disease (HCC) 11/11/2017  . Acute on chronic combined systolic and diastolic CHF (congestive heart failure) (HCC) 11/11/2017  . CAD (coronary artery disease), native coronary artery 11/11/2017  . Acute ST elevation myocardial infarction (STEMI) of inferolateral wall (HCC) 11/10/2017  . ST elevation myocardial infarction involving left circumflex coronary artery (HCC)   . AKI (acute kidney injury) (HCC) 01/22/2015  . Lactic acidosis 01/22/2015  . Syncope and collapse   . HTN (hypertension), benign   . Encephalopathy 09/07/2014  . Osteoarthrosis, unspecified whether generalized or localized, shoulder region 09/21/2013  . Gout 12/23/2012  . Hypotension 12/21/2012  . Type 2 diabetes mellitus with complication, without long-term current use of insulin (HCC) 12/21/2012  . Hyperlipidemia   . Morbid obesity (HCC)     Orientation RESPIRATION BLADDER Height & Weight     Self, Time, Situation, Place  Normal Incontinent, External catheter Weight: 240 lb 8.4 oz (109.1 kg) Height:  5\' 10"  (177.8 cm)   BEHAVIORAL SYMPTOMS/MOOD NEUROLOGICAL BOWEL NUTRITION STATUS      Incontinent Diet(please see DC summary)  AMBULATORY STATUS COMMUNICATION OF NEEDS Skin   Extensive Assist Verbally Other (Comment)(open wound R foot)                       Personal Care Assistance Level of Assistance  Bathing, Feeding, Dressing Bathing Assistance: Limited assistance Feeding assistance: Limited assistance Dressing Assistance: Limited assistance     Functional Limitations Info  Sight, Hearing, Speech Sight Info: Impaired Hearing Info: Adequate Speech Info: Adequate    SPECIAL CARE FACTORS FREQUENCY  PT (By licensed PT)     PT Frequency: 5x/week              Contractures Contractures Info: Not present    Additional Factors Info  Code Status, Allergies, Insulin Sliding Scale Code Status Info: Full Allergies Info: No Known Allergies   Insulin Sliding Scale Info: novolog Q4 hours       Current Medications (11/14/2017):  This is the current hospital active medication list Current Facility-Administered Medications  Medication Dose Route Frequency Provider Last Rate Last Dose  . aspirin suppository 150 mg  150 mg Rectal Daily Russella Dar, NP   150 mg at 11/14/17 1032  . cangrelor [facility]) 50 mg in sodium chloride 0.9 % 250 mL (0.2 mg/mL) infusion  0.75 mcg/kg/min Intravenous Continuous Marcelino Duster, PA 24.5 mL/hr at 11/14/17 0330 0.75 mcg/kg/min at 11/14/17 0330  . heparin injection 5,000 Units  5,000 Units Subcutaneous Q8H Creig Hines, NP   5,000 Units at 11/14/17 [phone]  . insulin  aspart (novoLOG) injection 0-9 Units  0-9 Units Subcutaneous Q4H Russella Dar, NP   2 Units at 11/14/17 1248  . latanoprost (XALATAN) 0.005 % ophthalmic solution 1 drop  1 drop Both Eyes QHS Creig Hines, NP   1 drop at 11/13/17 2227  . MEDLINE mouth rinse  15 mL Mouth Rinse BID Lennette Bihari, MD   15 mL at 11/14/17 1032  . morphine 2 MG/ML injection 2 mg  2 mg  Intravenous Q2H PRN Jonah Blue, MD      . nitroGLYCERIN (NITROSTAT) SL tablet 0.4 mg  0.4 mg Sublingual Q5 Min x 3 PRN Creig Hines, NP      . ondansetron [facility]) injection 4 mg  4 mg Intravenous Q6H PRN Lennette Bihari, MD   4 mg at 11/13/17 4627  . sodium chloride 0.45 % 1,000 mL with potassium chloride 40 mEq infusion   Intravenous Continuous [name], MD 75 mL/hr at 11/14/17 1020    . sodium chloride flush (NS) 0.9 % injection 3 mL  3 mL Intravenous Q12H Lennette Bihari, MD   3 mL at 11/14/17 1034  . sodium chloride flush (NS) 0.9 % injection 3 mL  3 mL Intravenous PRN Lennette Bihari, MD         Discharge Medications: Please see discharge summary for a list of discharge medications.  Relevant Imaging Results:  Relevant Lab Results:   Additional Information SSN: [id number]  Abigail Butts, LCSW

## 2017-11-14 NOTE — Clinical Social Work Note (Signed)
Clinical Social Work Assessment  Patient Details  Name: [name] MRN: [id number] Date of Birth: Jul 31, 1930  Date of referral:  11/14/17               Reason for consult:  Facility Placement, Discharge Planning                Permission sought to share information with:  Facility Sport and exercise psychologist, Family Supports Permission granted to share information::  Yes, Verbal Permission Granted  Name::     [name]  Agency::  SNFs  Relationship::  daughter  Contact Information:  [phone]  Housing/Transportation Living arrangements for the past 2 months:  Single Family Home Source of Information:  Patient Patient Interpreter Needed:  None Criminal Activity/Legal Involvement Pertinent to Current Situation/Hospitalization:  No - Comment as needed Significant Relationships:  Adult Children Lives with:  Adult Children Do you feel safe going back to the place where you live?  Yes Need for family participation in patient care:  Yes (Comment)  Care giving concerns: Patient from home with son. PT recommending SNF.   Social Worker assessment / plan: CSW met with patient at bedside. Patient alert and oriented, sitting up in bedside chair. CSW introduced self and role and discussed disposition planning, PT's recommendation for SNF. Patient agreeable to SNF. Patient reported he lives with his son, but his son works during the day. Patient requested CSW speak to his daughter, [name], about placement.  CSW spoke to [name] on the phone. Daughter also agreeable to SNF and reported he has been at [facility] in the past. However, [facility] not in network with patient's current [employer]. CSW sent out initial SNF referrals, awaiting bed offers. CSW will provide bed offers when available. Patient will require [facility] authorization before admitting to a facility. CSW to follow and support.  Employment status:  Retired Forensic scientist:  Managed Medicare([employer]) PT  Recommendations:  [place] / Referral to community resources:  [place]  Patient/Family's Response to care: Patient and daughter appreciative of care.  Patient/Family's Understanding of and Emotional Response to Diagnosis, Current Treatment, and Prognosis: Patient and daughter with understanding of conditions and recommendation for SNF.   Emotional Assessment Appearance:  Appears stated age Attitude/Demeanor/Rapport:  Engaged Affect (typically observed):  Calm, Accepting Orientation:  Oriented to Self, Oriented to Place, Oriented to  Time, Oriented to Situation Alcohol / Substance use:  Not Applicable Psych involvement (Current and /or in the community):     Discharge Needs  Concerns to be addressed:  Discharge Planning Concerns, Care Coordination Readmission within the last 30 days:  No Current discharge risk:  Physical Impairment Barriers to Discharge:  Continued Medical Work up, [place], [place] 11/14/2017, 1:58 PM

## 2017-11-14 NOTE — Progress Notes (Signed)
Report given to [facility] RN;Condom cath.came out ;clean the pt .& placed a new condom cath.& then transferred pt.to 6E room 06 via bed with his valuables .

## 2017-11-14 NOTE — Progress Notes (Addendum)
The patient has been seen in conjunction with Micah Flesher, PA-C. All aspects of care have been considered and discussed. The patient has been personally interviewed, examined, and all clinical data has been reviewed.   Feels better this morning with decompression of stomach per NG tube.  He is on intravenous Cangrelor and aspirin suppository to cover recently placed stents.  Trial of p.o. intake today after NG tube is out.  Probably convert to oral medication therapy tomorrow if he does not redevelop gastric dilatation.  Will need to be re-loaded with Brilinta.  Progress Note  Patient Name: [name] Date of Encounter: 11/14/2017  Primary Cardiologist: Thurmon Fair, MD   Subjective   Pt with NG tube in place. IM and surgery consulted.  Inpatient Medications    Scheduled Meds: . aspirin  150 mg Rectal Daily  . heparin  5,000 Units Subcutaneous Q8H  . insulin aspart  0-9 Units Subcutaneous Q4H  . latanoprost  1 drop Both Eyes QHS  . mouth rinse  15 mL Mouth Rinse BID  . sodium chloride flush  3 mL Intravenous Q12H   Continuous Infusions: . 0.9 % NaCl with KCl 20 mEq / L 75 mL/hr at 11/14/17 0503  . cangrelor 50 mg in NS 250 mL 0.75 mcg/kg/min (11/14/17 0330)   PRN Meds: morphine injection, nitroGLYCERIN, ondansetron (ZOFRAN) IV, sodium chloride flush   Vital Signs    Vitals:   11/13/17 2000 11/13/17 2030 11/14/17 0026 11/14/17 0505  BP:  (!) 142/96 (!) 144/96 (!) 144/95  Pulse: 92 88 91 88  Resp: 19 (!) 21 (!) 24 16  Temp:  98.3 F (36.8 C) 98.8 F (37.1 C) 99 F (37.2 C)  TempSrc:  Oral Oral Oral  SpO2: 96% 95% 96% 96%  Weight:    240 lb 8.4 oz (109.1 kg)  Height:        Intake/Output Summary (Last 24 hours) at 11/14/2017 0750 Last data filed at 11/13/2017 2200 Gross per 24 hour  Intake 543.37 ml  Output 1200 ml  Net -656.63 ml   Filed Weights   11/12/17 0500 11/13/17 0242 11/14/17 0505  Weight: 238 lb 8 oz (108.2 kg) 240 lb 1.3 oz (108.9 kg)  240 lb 8.4 oz (109.1 kg)    Telemetry    Sinus - Personally Reviewed  ECG    No new tracings - Personally Reviewed  Physical Exam   GEN: No acute distress.   Neck: No JVD Cardiac: RRR, no murmurs, rubs, or gallops.  Respiratory: Clear to auscultation bilaterally, diminished in bases GI: Soft, nontender, non-distended, abdomen much improved from yesterday MS: No edema; No deformity. Neuro:  Nonfocal  Psych: Normal affect   Labs    Chemistry Recent Labs  Lab 11/10/17 1100  11/11/17 0217 11/13/17 0245 11/14/17 0455  NA 139   < > 138 141 143  K 3.4*   < > 3.9 3.6 3.4*  CL 106   < > 104 107 105  CO2 24  --  23 25 27   GLUCOSE 144*   < > 121* 163* 114*  BUN 21*   < > 17 9 11   CREATININE 1.55*   < > 1.31* 1.26* 1.32*  CALCIUM 8.9  --  8.7* 8.9 8.8*  PROT 6.9  --  6.8  --  6.6  ALBUMIN 3.5  --  3.4*  --  3.3*  AST 16  --  204*  --  35  ALT 12*  --  45  --  22  ALKPHOS 61  --  58  --  50  BILITOT 0.8  --  0.8  --  0.9  GFRNONAA 39*  --  48* 51* 48*  GFRAA 46*  --  56* 59* 55*  ANIONGAP 9  --  11 9 11    < > = values in this interval not displayed.     Hematology Recent Labs  Lab 11/11/17 0217 11/13/17 0245 11/13/17 1604  WBC 6.8 7.0 5.9  RBC 4.48 4.20* 4.22  HGB 12.9* 12.0* 12.0*  HCT 40.5 38.2* 37.6*  MCV 90.4 91.0 89.1  MCH 28.8 28.6 28.4  MCHC 31.9 31.4 31.9  RDW 13.4 13.6 13.6  PLT 168 166 197    Cardiac Enzymes Recent Labs  Lab 11/10/17 1500 11/11/17 0217 11/12/17 1436  TROPONINI 51.42* >65.00* 23.16*    Recent Labs  Lab 11/10/17 1109  TROPIPOC 0.03     BNPNo results for input(s): BNP, PROBNP in the last 168 hours.   DDimer No results for input(s): DDIMER in the last 168 hours.   Radiology    Dg Abd 1 View  Result Date: 11/13/2017 CLINICAL DATA:  Abdomen pains. EXAM: ABDOMEN - 1 VIEW COMPARISON:  None. FINDINGS: The entire abdomen could not be visualized on a single AP radiograph, due to body habitus, therefore high and low exposures  were performed. Despite this, portions of the RIGHT abdomen are not visualized in their entirety. Gaseous distended stomach. Prominent LEFT mid abdominal small bowel loop could represent ileus or partial obstruction. Colonic gas is present all the way to the rectosigmoid region. A spinal cord stimulator overlies the lower thoracic region with the battery pack posteriorly on the RIGHT. Lumbar spondylosis. IMPRESSION: AP supine radiographs. Gaseous gastric distention. Single prominent LEFT mid abdominal small bowel loop, uncertain significance. Electronically Signed   By: Elsie Stain M.D.   On: 11/13/2017 13:40   Dg Abd Portable 1v-small Bowel Obstruction Protocol-initial, 8 Hr Delay  Result Date: 11/14/2017 CLINICAL DATA:  Small bowel obstruction.  Delayed film. EXAM: PORTABLE ABDOMEN - 1 VIEW COMPARISON:  Radiograph yesterday 2219 hour FINDINGS: Administered enteric contrast is visualized throughout the ascending, transverse, and descending and rectosigmoid colon. No small bowel dilatation. Enteric tube tip and side-port below the diaphragm in the stomach. Some residual high-density contrast remains in the gastric lumen. IMPRESSION: Administered enteric contrast seen throughout the entire colon. Electronically Signed   By: Rubye Oaks M.D.   On: 11/14/2017 04:32   Dg Abd Portable 1v  Result Date: 11/13/2017 CLINICAL DATA:  Nasogastric tube placement EXAM: PORTABLE ABDOMEN - 1 VIEW COMPARISON:  11/13/2017 FINDINGS: Nasogastric tube side port is within the stomach. The tip is near the gastroduodenal junction. The examination is otherwise unchanged. IMPRESSION: NG tube tip at the gastroduodenal junction and side port within the stomach. Electronically Signed   By: Deatra Robinson M.D.   On: 11/13/2017 22:33   Dg Abd Portable 1v-small Bowel Protocol-position Verification  Result Date: 11/13/2017 CLINICAL DATA:  Evaluate placement of NG tube. EXAM: PORTABLE ABDOMEN - 1 VIEW COMPARISON:  11/13/2017  FINDINGS: The nasogastric tube tip projects over the body of the stomach. There is contrast opacification of the stomach. Assuming the contrast was administered via the NG tube at the time this radiograph was obtained this confirms intraluminal placement. IMPRESSION: 1. Tip of NG tube projects over the stomach. 2. Contrast opacification of the gastric lumen which, assuming was administered via the NG tube at the time this radiograph was obtained, confirms intraluminal  placement Electronically Signed   By: Signa Kell M.D.   On: 11/13/2017 20:34    Cardiac Studies   Echo 11/11/17: Study Conclusions - Left ventricle: The cavity size was normal. There was moderate concentric hypertrophy. Systolic function was mildly to moderately reduced. The estimated ejection fraction was in the range of 40% to 45%. Doppler parameters are consistent with abnormal left ventricular relaxation (grade 1 diastolic dysfunction). There was no evidence of elevated ventricular filling pressure by Doppler parameters. - Aortic valve: Valve area (VTI): 2.08 cm^2. Valve area (Vmax): 2.13 cm^2. Valve area (Vmean): 1.98 cm^2. - Aortic root: The aortic root was normal in size. - Mitral valve: Valve area by pressure half-time: 1.28 cm^2. - Left atrium: The atrium was normal in size. - Right ventricle: The cavity size was normal. Wall thickness was normal. Systolic function was normal. - Right atrium: The atrium was normal in size. - Tricuspid valve: There was no regurgitation. - Pulmonary arteries: Systolic pressure could not be accurately estimated. - Inferior vena cava: The vessel was normal in size. - Pericardium, extracardiac: There was no pericardial effusion.  Impressions:  - There is akinesis of the basal and mid inferior, inefrolateral and apical inferior leads.   Left heart cath 11/10/17:  Mid RCA lesion is 95% stenosed.  Dist RCA lesion is 80% stenosed.  Prox Cx to Mid Cx  lesion is 90% stenosed.  Ost 2nd Mrg to 2nd Mrg lesion is 100% stenosed.  2nd Mrg lesion is 95% stenosed.  1st Diag lesion is 95% stenosed.  Mid LAD lesion is 80% stenosed.  Dist LAD lesion is 90% stenosed.  Post intervention, there is a 0% residual stenosis.  Post intervention, there is a 0% residual stenosis.  Post intervention, there is a 0% residual stenosis.  A stent was successfully placed.  A stent was successfully placed.  A stent was successfully placed.  Acute inferolateral ST segment elevation myocardial infarction secondary to total occlusion of the circumflex vessel.  Multivessel CAD with 30% proximal and 95% distal diagonal 1 stenoses of the LAD, 80% mid distal and 90% distal LAD stenoses, and large dominant RCA with 95% mid stenosis and 80% distal stenosis prior to the PDA takeoff.   Successful complex percutaneous coronary intervention involving the proximal circumflex, proximal large OM vessel and distal inferior branch of this vessel after a large additional branch which cannot be visualized on the diagram above. A 2.0 x 12 mm Resolute Onyx DES stent was placed in the distal inferior secondary branch of the marginal vessel and reduced to 0%; the site of 100% occlusion was treated with PTCA/DES stenting with a 2.5 x 18 mm Resolute DES stent positioned just proximal to the more distal bifurcation into a large branch and distal inferior branch with the 100% occlusion being reduced to 0% with post dilatation up to 3.0 mm; and the proximal 90% stenosis on a sharp bend of the vessel being reduced to 0% with ultimate insertion of a 3.0 x 18 mm Resolute stent postdilated to 3.34 mm.  LVEDP 24 mm Hg.  RECOMMENDATION: DAPT therapy for minimum of 1 year, and probably indefinitely. Once the patient blood pressure is stable we will initiate beta-blocker and ACE/arm therapy. A 2D echo Doppler study will be done to assess LV function. The patient will be hydrated post  procedure. On Tuesday, November 12, 2017 if renal function is stable, plan stageD PCI to the mid and distal RCA with relook at the left system.   Diagnostic Diagram  Post-Intervention Diagram         Staged PCI 11/12/17:  1st Diag lesion is 95% stenosed.  Mid LAD lesion is 70% stenosed.  Dist LAD lesion is 85% stenosed.  Previously placed 2nd Mrg stent (unknown type) is widely patent.  Previously placed Ost 2nd Mrg to 2nd Mrg stent (unknown type) is widely patent.  Previously placed Prox Cx to Mid Cx stent (unknown type) is widely patent.  Dist RCA lesion is 80% stenosed.  Post intervention, there is a 0% residual stenosis.  Prox RCA to Mid RCA lesion is 30% stenosed.  Mid RCA lesion is 95% stenosed.  A stent was successfully placed.  Post intervention, there is a 0% residual stenosis.  Post intervention, there is a 0% residual stenosis.  A stent was successfully placed.  Re-look of the left coronary circulation revealed widely patent stents extending from the proximal circumflex into the marginal vessel and also into the distal branch without restenosis. The LAD had previously demonstrated 95% distal stenosis in a small caliber diagonal vessel, and the mid distal to distal LAD lesions appeared slightly improved now 70 and 85%.  Successful PCI to the RCA with DES stenting of the distal 80% stenosis reduced to 0% with a 2.25 x 12 mm Resolute Onyx stent postdilated to 2.4 mm, and the mid RCA stenosis of 95% with proximal eccentric narrowing stented with a 3.0 x 26 mm Resolute stent postdilated to 3.36 tapering to 3.25 with the entire stenosis being reduced to 0%.  RECOMMENDATION: DAPT for minimum of 1 year. The patient will be started on carvedilol in addition to amlodipine in light of his hypertension as well as concomitant CAD. He is on high potency statin therapy.   Diagnostic Diagram       Post-Intervention Diagram          Patient  Profile     84 y.o. male with ahistory of hypertension, type 2 diabetes mellitus, hyperlipidemia, diastolic dysfunction, prior history of syncope, orthostatic hypotension, CKD stage III secondary to diabetes and hypertension who presented with acute inferolateral MI on 11/10/2017 due to total occlusion of the circumflex in the setting of multivessel CAD. Complex interventional circumflex was completed with reperfusion. Residual significant RCA disease treated with staged PCI (DES to mid and distal RCA) and distal LAD disease (medical therapy recommended). Yesterday, he developed nausea, vomiting, and abdominal distension concerning for ileus. IM and surgery consulted, NG tube in place.  Assessment & Plan    1. Inferolateral STEMI, multivessel disease - total occlusion of the Cx with DES x 2 to proximal large OM and distal inferior branch of this OM and distal inferior branch of this OM (11/10/17); staged PCI 11/12/17 with 95% stenosis in midRCA and 80% stenosis in the distal RCA, both treated with DES - pt recovered well - ASA and brilinta were D/C'ed for NPO status - cangrelor started yesterday and ASA suppository added  2. Acute on chronic kidney disease III - tolerated hydration pre and post caths - sCr was normal prior to N/V, increased likely secondary to dehydration - sCr today 1.32  3. Acute on chronic combined systolic and diastolic heart failure - echo this admission with LVEF of 40-45% and grade 1 DD - gentle hydration with NPO status  4. Ileus - N/V all day yesterday with abdominal distension - KUB with significant gastric distension - IM and surgery consulted, NG tube in place with 650 cc out - abdominal distension much improved, pt feels better, no nausea or  vomiting - lactic acid was normal, no immediate concern for ischemic bowel  5. Disposition - PT unable to work with patient yesterday   For questions or updates, please contact [facility] Please consult  [email or web address] for contact info under Cardiology/STEMI.      Signed, Roe Rutherford Duke, PA  11/14/2017, 7:50 AM

## 2017-11-14 NOTE — Progress Notes (Signed)
[facility] PROGRESS NOTE  [name]  JXB:[id number] DOB: Jan 26, 1931 DOA: 11/10/2017 PCP: Clayborn Heron, MD  Brief Narrative: [name] is an 84 y.o. male with a history of T2DM, hx CVA, OSA, HTN, HLD, stage III CKD and macular degeneration/glaucoma causing essential blindness who was admitted 6/9 by cardiology for STEMI, underwent staged PCI with recovery complicated by abdominal pain, distention and XR findings suggestive of ileus with prominent small bowel loop in the mid abdomen. Hospitalist and surgery teams were consulted.   Subjective: He's hungry, abdominal pain is nearly gone. +flatus and BM, "that contrast has moved through." Denies dyspnea, chest pain.   Objective: BP (!) 144/95 (BP Location: Left Arm)   Pulse 88   Temp 99 F (37.2 C) (Oral)   Resp 16   Ht 5\' 10"  (1.778 m)   Wt 109.1 kg (240 lb 8.4 oz)   SpO2 96%   BMI 34.51 kg/m   Gen: Obese, pleasant male in no distress HEENT: NG tube in right nare draining clear yellow. EOMI though visual acuity minimal. Pulm: Clear and nonlabored on room air  CV: RRR, no murmur, no definite JVD, no significant edema GI: Soft, slightly distended but not tender, no rebound or guarding. Hypoactive bowel sounds. Neuro: Alert and oriented. No focal deficits. Ext: Warm, no deformities Skin: Groin access site w/echymosis w/o hematoma.   Assessment & Plan: Principal Problem:   Ileus, unspecified (HCC) Active Problems:   Type 2 diabetes mellitus with complication, without long-term current use of insulin (HCC)   HTN (hypertension), benign   ST elevation myocardial infarction involving left circumflex coronary artery (HCC)   Acute ST elevation myocardial infarction (STEMI) of inferolateral wall (HCC)   Stage III chronic kidney disease (HCC)   Acute on chronic combined systolic and diastolic CHF (congestive heart failure) (HCC)   Sleep apnea   Obesity  Ileus: Likely related to immobility/coronary procedures.  Fortunately contrast is throughout colon on gastrografin protocol. No h/o SBO. Lactate wnl. No WBC or fever. - Up as tolerated, minimize narcotic pain medications.  - Laxative regimen - Could probably clamp NG and start clears, though will see what surgery thinks.  CAD: s/p DES x3 6/9 and staged PCI w/DES x2 6/11.  - Per primary. Will need DAPT at least 1 year. Currently on rectal ASA and IV cangrelor while awaiting restart of bowel function.   T2DM: CBGs at goal. - SSI  HTN: Has room to start guideline-directed therapy once taking po. - IV hydralazine ordered prn. Defer management to cardiology.   Stage III CKD: At baseline - Monitor, minimize nephrotoxins  OSA:  - CPAP once NG tube out  Hypokalemia:  - Continue IV replacement while NPO  Acute on chronic HFrEF:  - Monitor volume status closely. Decrease to 1/2NS in IVF and stop once taking po.   Ricky Nine, MD [facility] [email or web address] Password [facility] 11/14/2017, 7:56 AM

## 2017-11-15 LAB — BASIC METABOLIC PANEL
Anion gap: 5 (ref 5–15)
BUN: 10 mg/dL (ref 6–20)
CO2: 27 mmol/L (ref 22–32)
Calcium: 8.4 mg/dL — ABNORMAL LOW (ref 8.9–10.3)
Chloride: 108 mmol/L (ref 101–111)
Creatinine, Ser: 1.27 mg/dL — ABNORMAL HIGH (ref 0.61–1.24)
GFR calc Af Amer: 58 mL/min — ABNORMAL LOW (ref >60)
GFR calc non Af Amer: 50 mL/min — ABNORMAL LOW (ref >60)
Glucose, Bld: 106 mg/dL — ABNORMAL HIGH (ref 65–99)
Potassium: 3.9 mmol/L (ref 3.5–5.1)
Sodium: 140 mmol/L (ref 135–145)

## 2017-11-15 LAB — GLUCOSE, CAPILLARY
Glucose-Capillary: 105 mg/dL — ABNORMAL HIGH (ref 65–99)
Glucose-Capillary: 112 mg/dL — ABNORMAL HIGH (ref 65–99)
Glucose-Capillary: 122 mg/dL — ABNORMAL HIGH (ref 65–99)
Glucose-Capillary: 96 mg/dL (ref 65–99)
Glucose-Capillary: 96 mg/dL (ref 65–99)
Glucose-Capillary: 99 mg/dL (ref 65–99)

## 2017-11-15 MED ORDER — SODIUM CHLORIDE 0.9 % IV SOLN
0.7500 ug/kg/min | INTRAVENOUS | Status: AC
  Filled 2017-11-15: qty 50

## 2017-11-15 MED ORDER — PRAVASTATIN SODIUM 40 MG PO TABS
40.0000 mg | ORAL_TABLET | Freq: Every day | ORAL | Status: DC
  Administered 2017-11-15 – 2017-11-18 (×4): 40 mg via ORAL
  Filled 2017-11-15 (×4): qty 1

## 2017-11-15 MED ORDER — ASPIRIN EC 81 MG PO TBEC
81.0000 mg | DELAYED_RELEASE_TABLET | Freq: Every day | ORAL | Status: DC
Start: 2017-11-15 — End: 2017-11-18
  Administered 2017-11-15 – 2017-11-18 (×4): 81 mg via ORAL
  Filled 2017-11-15 (×4): qty 1

## 2017-11-15 MED ORDER — FUROSEMIDE 10 MG/ML IJ SOLN
40.0000 mg | Freq: Once | INTRAMUSCULAR | Status: AC
  Administered 2017-11-15: 40 mg via INTRAVENOUS
  Filled 2017-11-15: qty 4

## 2017-11-15 MED ORDER — SERTRALINE HCL 50 MG PO TABS
50.0000 mg | ORAL_TABLET | Freq: Every day | ORAL | Status: DC
  Administered 2017-11-16 – 2017-11-17 (×2): 50 mg via ORAL
  Filled 2017-11-15 (×3): qty 1

## 2017-11-15 MED ORDER — TICAGRELOR 90 MG PO TABS
180.0000 mg | ORAL_TABLET | Freq: Once | ORAL | Status: AC
  Administered 2017-11-15: 180 mg via ORAL
  Filled 2017-11-15: qty 2

## 2017-11-15 MED ORDER — LOSARTAN POTASSIUM 50 MG PO TABS
50.0000 mg | ORAL_TABLET | Freq: Every day | ORAL | Status: DC
  Administered 2017-11-15 – 2017-11-17 (×3): 50 mg via ORAL
  Filled 2017-11-15 (×3): qty 1

## 2017-11-15 MED ORDER — TICAGRELOR 90 MG PO TABS
90.0000 mg | ORAL_TABLET | Freq: Two times a day (BID) | ORAL | Status: DC
  Administered 2017-11-15 – 2017-11-18 (×6): 90 mg via ORAL
  Filled 2017-11-15 (×6): qty 1

## 2017-11-15 NOTE — Progress Notes (Signed)
Pt having loose stools this am and not wanting to walk. Pt educated on importance of Brilinta, ASA, and NTG. Demonstrates understanding of use of NTG. Discussed with pt what an MI is and what causes it along with symptoms to watch out for. MI booklet along with heart healthy and diabetic diets left at bedside for pts caregiver "Kathlene November". Reviewed restrictions with pt, along with importance of mobility. Did not give pt exercise guidelines, but did encourage pt to do what he can and stay mobile.   [id number] Reynold Bowen, RN BSN 11/15/2017 11:15 AM

## 2017-11-15 NOTE — Progress Notes (Signed)
[facility] PROGRESS NOTE  [name]  LSL:[id number] DOB: 06-05-1930 DOA: 11/10/2017 PCP: Clayborn Heron, MD  Brief Narrative: [name] is an 84 y.o. male with a history of T2DM, hx CVA, OSA, HTN, HLD, stage III CKD and macular degeneration/glaucoma causing essential blindness who was admitted 6/9 by cardiology for STEMI, underwent staged PCI with recovery complicated by abdominal pain, distention and XR findings suggestive of ileus with prominent small bowel loop in the mid abdomen. Hospitalist and surgery teams were consulted. NG tube removed 6/13, advanced to clears and then heart healthy on 6/14.   Subjective: Having constant 5/10 right sided abdominal pain with mild nausea, no vomiting starting this morning (before he started solid food). Has eaten about 10% lunch but keeping it down. Denies dyspnea, chest pain.   Objective: BP (!) 162/97 (BP Location: Right Wrist)   Pulse 81   Temp 99 F (37.2 C) (Oral)   Resp 15   Ht 5\' 10"  (1.778 m)   Wt 110.5 kg (243 lb 9.7 oz)   SpO2 100%   BMI 34.95 kg/m   Gen: Obese, pleasant male resting quietly in bed HEENT: Normal. EOMI, poor visual acuity, FC bilaterally.  Pulm: Nonlabored and clear CV: RRR no M/R/G. Developing some LE edema. GI: Soft, mildly distended with tenderness to deep palpation on right U>L abdomen without rebound or guarding. Eulah Pont.  Neuro: A+O, no focal deficits. Skin: Groin access site w/echymosis stable.  Assessment & Plan: Principal Problem:   Ileus, unspecified (HCC) Active Problems:   Type 2 diabetes mellitus with complication, without long-term current use of insulin (HCC)   HTN (hypertension), benign   ST elevation myocardial infarction (STEMI) (HCC)   Acute ST elevation myocardial infarction (STEMI) of inferolateral wall (HCC)   Stage III chronic kidney disease (HCC)   Acute on chronic combined systolic and diastolic CHF (congestive heart failure) (HCC)   Sleep apnea   Obesity  Bilious vomiting  Ileus: Likely related to immobility/coronary procedures. Fortunately contrast is throughout colon on gastrografin protocol. No h/o SBO. Lactate wnl. No WBC or fever. - Advanced to solids today but has 5/10 abd pain. Exam remains benign and is having BMs. Agree with continued monitoring to verify tolerance of diet and meds. Surgery signed off.  CAD: s/p DES x3 6/9 and staged PCI w/DES x2 6/11.  - Per primary. Will need DAPT at least 1 year. See plan to reload brilinta.  Acute on chronic HFrEF:  - Monitor volume status closely. Stop IVF, give lasix now that taking po and developing some edema (this has already been ordered).   T2DM: CBGs at goal. - SSI  HTN: Has room to start guideline-directed therapy once taking po. - IV hydralazine ordered prn. Defer management to cardiology.   Stage III CKD: At baseline - Monitor, minimize nephrotoxins  OSA:  - CPAP  Hypokalemia:  - Continue IV replacement while NPO   [name], MD [facility] [email or web address] Password [email or web address] 11/15/2017, 1:06 PM

## 2017-11-15 NOTE — Progress Notes (Signed)
CSW spoke to patient's daughter on the phone and reviewed SNF bed offers. Daughter chose [facility].   CSW confirmed bed at [facility] and started [facility]) authorization request. Patient will require insurance authorization before admitting to facility.  CSW informed daughter that if [facility] not received and patient ready over the weekend, patient will likely need to discharge to a facility that can take patient with authorization pending instead (LOG). Daughter hopeful to get [place] at [facility].   CSW to follow and support with discharge planning.  Abigail Butts, LCSWA [phone]

## 2017-11-15 NOTE — Progress Notes (Addendum)
The patient has been seen in conjunction with Micah Flesher, PA-C. All aspects of care have been considered and discussed. The patient has been personally interviewed, examined, and all clinical data has been reviewed.   NG tube has been discontinued.  Will be loaded with Brilinta 180 mg then 90 mg p.o. twice daily.  Discontinue Cangrelor 2 hours after loading dose of Brilinta.  Needs observation for at least another 24 hours to ensure ileus does not recur and that antiplatelet therapy will stay down.  Volume overload and needs a dose of IV Lasix.  Progress Note  Patient Name: [name] Date of Encounter: 11/15/2017  Primary Cardiologist: Thurmon Fair, MD   Subjective   Pt feels much better and is tolerating a liquid diet. He denies chest pain and continues to complain about SOB that has been stable over the past several days.  Inpatient Medications    Scheduled Meds: . aspirin  150 mg Rectal Daily  . heparin  5,000 Units Subcutaneous Q8H  . insulin aspart  0-9 Units Subcutaneous Q4H  . latanoprost  1 drop Both Eyes QHS  . mouth rinse  15 mL Mouth Rinse BID  . sodium chloride flush  3 mL Intravenous Q12H   Continuous Infusions: . cangrelor 50 mg in NS 250 mL 0.75 mcg/kg/min (11/14/17 1353)   PRN Meds: morphine injection, nitroGLYCERIN, ondansetron (ZOFRAN) IV, sodium chloride flush   Vital Signs    Vitals:   11/14/17 2044 11/15/17 0025 11/15/17 0356 11/15/17 0757  BP: (!) 144/104 139/80 (!) 142/101 (!) 141/91  Pulse: 79 83 84 82  Resp: 20 11 14 20   Temp: 98.2 F (36.8 C) 98.2 F (36.8 C) 98.4 F (36.9 C) 98.4 F (36.9 C)  TempSrc: Oral Oral Oral Oral  SpO2: 100% 100% 100% 98%  Weight:   243 lb 9.7 oz (110.5 kg)   Height:        Intake/Output Summary (Last 24 hours) at 11/15/2017 0958 Last data filed at 11/15/2017 0700 Gross per 24 hour  Intake 1743.5 ml  Output 550 ml  Net 1193.5 ml   Filed Weights   11/13/17 0242 11/14/17 0505 11/15/17 0356    Weight: 240 lb 1.3 oz (108.9 kg) 240 lb 8.4 oz (109.1 kg) 243 lb 9.7 oz (110.5 kg)    Telemetry    Sinus  - Personally Reviewed  ECG    No new tracings - Personally Reviewed  Physical Exam   GEN: No acute distress.   Neck: No JVD Cardiac: RRR, no murmurs, rubs, or gallops.  Respiratory: Clear to auscultation bilaterally. GI: Soft, nontender, non-distended  MS: trace  edema; No deformity. Neuro:  Nonfocal  Psych: Normal affect   Labs    Chemistry Recent Labs  Lab 11/10/17 1100  11/11/17 0217 11/13/17 0245 11/14/17 0455 11/15/17 0422  NA 139   < > 138 141 143 140  K 3.4*   < > 3.9 3.6 3.4* 3.9  CL 106   < > 104 107 105 108  CO2 24  --  23 25 27 27   GLUCOSE 144*   < > 121* 163* 114* 106*  BUN 21*   < > 17 9 11 10   CREATININE 1.55*   < > 1.31* 1.26* 1.32* 1.27*  CALCIUM 8.9  --  8.7* 8.9 8.8* 8.4*  PROT 6.9  --  6.8  --  6.6  --   ALBUMIN 3.5  --  3.4*  --  3.3*  --   AST  16  --  204*  --  35  --   ALT 12*  --  45  --  22  --   ALKPHOS 61  --  58  --  50  --   BILITOT 0.8  --  0.8  --  0.9  --   GFRNONAA 39*  --  48* 51* 48* 50*  GFRAA 46*  --  56* 59* 55* 58*  ANIONGAP 9  --  11 9 11 5    < > = values in this interval not displayed.     Hematology Recent Labs  Lab 11/11/17 0217 11/13/17 0245 11/13/17 1604  WBC 6.8 7.0 5.9  RBC 4.48 4.20* 4.22  HGB 12.9* 12.0* 12.0*  HCT 40.5 38.2* 37.6*  MCV 90.4 91.0 89.1  MCH 28.8 28.6 28.4  MCHC 31.9 31.4 31.9  RDW 13.4 13.6 13.6  PLT 168 166 197    Cardiac Enzymes Recent Labs  Lab 11/10/17 1500 11/11/17 0217 11/12/17 1436  TROPONINI 51.42* >65.00* 23.16*    Recent Labs  Lab 11/10/17 1109  TROPIPOC 0.03     BNPNo results for input(s): BNP, PROBNP in the last 168 hours.   DDimer No results for input(s): DDIMER in the last 168 hours.   Radiology    Dg Abd 1 View  Result Date: 11/13/2017 CLINICAL DATA:  Abdomen pains. EXAM: ABDOMEN - 1 VIEW COMPARISON:  None. FINDINGS: The entire abdomen could  not be visualized on a single AP radiograph, due to body habitus, therefore high and low exposures were performed. Despite this, portions of the RIGHT abdomen are not visualized in their entirety. Gaseous distended stomach. Prominent LEFT mid abdominal small bowel loop could represent ileus or partial obstruction. Colonic gas is present all the way to the rectosigmoid region. A spinal cord stimulator overlies the lower thoracic region with the battery pack posteriorly on the RIGHT. Lumbar spondylosis. IMPRESSION: AP supine radiographs. Gaseous gastric distention. Single prominent LEFT mid abdominal small bowel loop, uncertain significance. Electronically Signed   By: Elsie Stain M.D.   On: 11/13/2017 13:40   Dg Abd Portable 1v-small Bowel Obstruction Protocol-initial, 8 Hr Delay  Result Date: 11/14/2017 CLINICAL DATA:  Small bowel obstruction.  Delayed film. EXAM: PORTABLE ABDOMEN - 1 VIEW COMPARISON:  Radiograph yesterday 2219 hour FINDINGS: Administered enteric contrast is visualized throughout the ascending, transverse, and descending and rectosigmoid colon. No small bowel dilatation. Enteric tube tip and side-port below the diaphragm in the stomach. Some residual high-density contrast remains in the gastric lumen. IMPRESSION: Administered enteric contrast seen throughout the entire colon. Electronically Signed   By: Rubye Oaks M.D.   On: 11/14/2017 04:32   Dg Abd Portable 1v  Result Date: 11/13/2017 CLINICAL DATA:  Nasogastric tube placement EXAM: PORTABLE ABDOMEN - 1 VIEW COMPARISON:  11/13/2017 FINDINGS: Nasogastric tube side port is within the stomach. The tip is near the gastroduodenal junction. The examination is otherwise unchanged. IMPRESSION: NG tube tip at the gastroduodenal junction and side port within the stomach. Electronically Signed   By: Deatra Robinson M.D.   On: 11/13/2017 22:33   Dg Abd Portable 1v-small Bowel Protocol-position Verification  Result Date: 11/13/2017 CLINICAL  DATA:  Evaluate placement of NG tube. EXAM: PORTABLE ABDOMEN - 1 VIEW COMPARISON:  11/13/2017 FINDINGS: The nasogastric tube tip projects over the body of the stomach. There is contrast opacification of the stomach. Assuming the contrast was administered via the NG tube at the time this radiograph was obtained this confirms intraluminal placement.  IMPRESSION: 1. Tip of NG tube projects over the stomach. 2. Contrast opacification of the gastric lumen which, assuming was administered via the NG tube at the time this radiograph was obtained, confirms intraluminal placement Electronically Signed   By: Signa Kell M.D.   On: 11/13/2017 20:34    Cardiac Studies   Echo 11/11/17: Study Conclusions - Left ventricle: The cavity size was normal. There was moderate concentric hypertrophy. Systolic function was mildly to moderately reduced. The estimated ejection fraction was in the range of 40% to 45%. Doppler parameters are consistent with abnormal left ventricular relaxation (grade 1 diastolic dysfunction). There was no evidence of elevated ventricular filling pressure by Doppler parameters. - Aortic valve: Valve area (VTI): 2.08 cm^2. Valve area (Vmax): 2.13 cm^2. Valve area (Vmean): 1.98 cm^2. - Aortic root: The aortic root was normal in size. - Mitral valve: Valve area by pressure half-time: 1.28 cm^2. - Left atrium: The atrium was normal in size. - Right ventricle: The cavity size was normal. Wall thickness was normal. Systolic function was normal. - Right atrium: The atrium was normal in size. - Tricuspid valve: There was no regurgitation. - Pulmonary arteries: Systolic pressure could not be accurately estimated. - Inferior vena cava: The vessel was normal in size. - Pericardium, extracardiac: There was no pericardial effusion.  Impressions:  - There is akinesis of the basal and mid inferior, inefrolateral and apical inferior leads.   Left heart cath  11/10/17:  Mid RCA lesion is 95% stenosed.  Dist RCA lesion is 80% stenosed.  Prox Cx to Mid Cx lesion is 90% stenosed.  Ost 2nd Mrg to 2nd Mrg lesion is 100% stenosed.  2nd Mrg lesion is 95% stenosed.  1st Diag lesion is 95% stenosed.  Mid LAD lesion is 80% stenosed.  Dist LAD lesion is 90% stenosed.  Post intervention, there is a 0% residual stenosis.  Post intervention, there is a 0% residual stenosis.  Post intervention, there is a 0% residual stenosis.  A stent was successfully placed.  A stent was successfully placed.  A stent was successfully placed.  Acute inferolateral ST segment elevation myocardial infarction secondary to total occlusion of the circumflex vessel.  Multivessel CAD with 30% proximal and 95% distal diagonal 1 stenoses of the LAD, 80% mid distal and 90% distal LAD stenoses, and large dominant RCA with 95% mid stenosis and 80% distal stenosis prior to the PDA takeoff.   Successful complex percutaneous coronary intervention involving the proximal circumflex, proximal large OM vessel and distal inferior branch of this vessel after a large additional branch which cannot be visualized on the diagram above. A 2.0 x 12 mm Resolute Onyx DES stent was placed in the distal inferior secondary branch of the marginal vessel and reduced to 0%; the site of 100% occlusion was treated with PTCA/DES stenting with a 2.5 x 18 mm Resolute DES stent positioned just proximal to the more distal bifurcation into a large branch and distal inferior branch with the 100% occlusion being reduced to 0% with post dilatation up to 3.0 mm; and the proximal 90% stenosis on a sharp bend of the vessel being reduced to 0% with ultimate insertion of a 3.0 x 18 mm Resolute stent postdilated to 3.34 mm.  LVEDP 24 mm Hg.  RECOMMENDATION: DAPT therapy for minimum of 1 year, and probably indefinitely. Once the patient blood pressure is stable we will initiate beta-blocker and ACE/arm  therapy. A 2D echo Doppler study will be done to assess LV function. The patient  will be hydrated post procedure. On Tuesday, November 12, 2017 if renal function is stable, plan stageD PCI to the mid and distal RCA with relook at the left system.   Diagnostic Diagram       Post-Intervention Diagram         Staged PCI 11/12/17:  1st Diag lesion is 95% stenosed.  Mid LAD lesion is 70% stenosed.  Dist LAD lesion is 85% stenosed.  Previously placed 2nd Mrg stent (unknown type) is widely patent.  Previously placed Ost 2nd Mrg to 2nd Mrg stent (unknown type) is widely patent.  Previously placed Prox Cx to Mid Cx stent (unknown type) is widely patent.  Dist RCA lesion is 80% stenosed.  Post intervention, there is a 0% residual stenosis.  Prox RCA to Mid RCA lesion is 30% stenosed.  Mid RCA lesion is 95% stenosed.  A stent was successfully placed.  Post intervention, there is a 0% residual stenosis.  Post intervention, there is a 0% residual stenosis.  A stent was successfully placed.  Re-look of the left coronary circulation revealed widely patent stents extending from the proximal circumflex into the marginal vessel and also into the distal branch without restenosis. The LAD had previously demonstrated 95% distal stenosis in a small caliber diagonal vessel, and the mid distal to distal LAD lesions appeared slightly improved now 70 and 85%.  Successful PCI to the RCA with DES stenting of the distal 80% stenosis reduced to 0% with a 2.25 x 12 mm Resolute Onyx stent postdilated to 2.4 mm, and the mid RCA stenosis of 95% with proximal eccentric narrowing stented with a 3.0 x 26 mm Resolute stent postdilated to 3.36 tapering to 3.25 with the entire stenosis being reduced to 0%.  RECOMMENDATION: DAPT for minimum of 1 year. The patient will be started on carvedilol in addition to amlodipine in light of his hypertension as well as concomitant CAD. He is on high potency  statin therapy.   Diagnostic Diagram       Post-Intervention Diagram         Patient Profile     84 y.o. male  with ahistory of hypertension, type 2 diabetes mellitus, hyperlipidemia, diastolic dysfunction, prior history of syncope, orthostatic hypotension, CKD stage III secondary to diabetes and hypertension who presented with acute inferolateral MI on 11/10/2017 due to total occlusion of the circumflex in the setting of multivessel CAD. Complex interventional circumflex was completed with reperfusion. Residual significant RCA disease treated with staged PCI (DES to mid and distal RCA) and distal LAD disease (medical therapy recommended). Yesterday, he developed nausea, vomiting, and abdominal distension concerning for ileus. IM and surgery consulted, NG tube was placed for gastric decompression. NG tube was removed last night and has been advanced to a heart healthy diet.   Assessment & Plan    1. Inferolateral STEMI, multivessel disease - total occlusion of the Cx with DES x 2 to proximal large OM and distal inferior branch of this OM (11/10/17); staged PCI (11/12/17) with 95% stenosis in midRCA and 80% stenosis in the distal RCA, both treated with DES - pt recovered from procedure well and reports no further chest pain - brilinta will be reloaded today with 180 mg OTO, followed by 90 mg BID starting 12 hrs later. Will also add 81 mg ASA.  - D/C IV cangrelor 2 hrs after brilinta load administered  2. Ileus - resolved - KUB with gastric distension - hospitalist and surgery were consulted - NG tube was placed for  gastric decompression - ASA and brilinta were transitioned to ASA suppository and IV cangrelor - NG tube clamped and subsequently removed 11/14/17 - pt tolerated liquids and was advanced to heart healthy diet for lunch this afternoon - suspected etiology was immobility  3. Acute on chronic kidney disease stage III - he was hydrated pre and post cath - sCr 1.27 which is  near his suspected baseline  4. Acute on chronic systolic and diastolic heart failure - echo this admission with LVEF of 40-45% and grade 1 DD - he received IV hydration while NPO for ileus - he has complained of stable shortness of breath every day. Lungs are clear on exam with minimal edema on exam - will give one dose of lasix this afternoon - 40 mg IV OTO  5. HTN Restarted PO losartan. Pressures have been in the 140s.  6. Mobility PT ordered. Pt is legally blind and walks with a walker.   Monitor for another 24 hrs before discharging home, possibly tomorrow. He has done well from a cardiac perspective.    For questions or updates, please contact [facility] Please consult [email or web address] for contact info under Cardiology/STEMI.      Signed, Roe Rutherford Duke, PA  11/15/2017, 9:58 AM

## 2017-11-15 NOTE — Progress Notes (Signed)
Patient ID: [name], male   DOB: [birth year], 84 y.o.   MRN: [id number]    3 Days Post-Op  Subjective: Patient feels well today.  Tired of liquids.  Had a BM yesterday afternoon and this morning.  No nausea. No pain.  Objective: Vital signs in last 24 hours: Temp:  [98.2 F (36.8 C)-98.4 F (36.9 C)] 98.4 F (36.9 C) (06/14 0757) Pulse Rate:  [79-84] 82 (06/14 0757) Resp:  [11-20] 20 (06/14 0757) BP: (139-144)/(80-104) 141/91 (06/14 0757) SpO2:  [98 %-100 %] 98 % (06/14 0757) Weight:  [110.5 kg (243 lb 9.7 oz)] 110.5 kg (243 lb 9.7 oz) (06/14 0356) Last BM Date: 11/14/17  Intake/Output from previous day: 06/13 0701 - 06/14 0700 In: 1743.5 [P.O.:360; I.V.:1383.5] Out: 550 [Urine:550] Intake/Output this shift: No intake/output data recorded.  PE: Abd: soft, essentially NT, ND, obese, +BS  Lab Results:  Recent Labs    11/13/17 0245 11/13/17 1604  WBC 7.0 5.9  HGB 12.0* 12.0*  HCT 38.2* 37.6*  PLT 166 197   BMET Recent Labs    11/14/17 0455 11/15/17 0422  NA 143 140  K 3.4* 3.9  CL 105 108  CO2 27 27  GLUCOSE 114* 106*  BUN 11 10  CREATININE 1.32* 1.27*  CALCIUM 8.8* 8.4*   PT/INR No results for input(s): LABPROT, INR in the last 72 hours. CMP     Component Value Date/Time   NA 140 11/15/2017 0422   K 3.9 11/15/2017 0422   CL 108 11/15/2017 0422   CO2 27 11/15/2017 0422   GLUCOSE 106 (H) 11/15/2017 0422   BUN 10 11/15/2017 0422   CREATININE 1.27 (H) 11/15/2017 0422   CALCIUM 8.4 (L) 11/15/2017 0422   PROT 6.6 11/14/2017 0455   ALBUMIN 3.3 (L) 11/14/2017 0455   AST 35 11/14/2017 0455   ALT 22 11/14/2017 0455   ALKPHOS 50 11/14/2017 0455   BILITOT 0.9 11/14/2017 0455   GFRNONAA 50 (L) 11/15/2017 0422   GFRAA 58 (L) 11/15/2017 0422   Lipase     Component Value Date/Time   LIPASE 42 11/13/2017 1555       Studies/Results: Dg Abd 1 View  Result Date: 11/13/2017 CLINICAL DATA:  Abdomen pains. EXAM: ABDOMEN - 1 VIEW COMPARISON:   None. FINDINGS: The entire abdomen could not be visualized on a single AP radiograph, due to body habitus, therefore high and low exposures were performed. Despite this, portions of the RIGHT abdomen are not visualized in their entirety. Gaseous distended stomach. Prominent LEFT mid abdominal small bowel loop could represent ileus or partial obstruction. Colonic gas is present all the way to the rectosigmoid region. A spinal cord stimulator overlies the lower thoracic region with the battery pack posteriorly on the RIGHT. Lumbar spondylosis. IMPRESSION: AP supine radiographs. Gaseous gastric distention. Single prominent LEFT mid abdominal small bowel loop, uncertain significance. Electronically Signed   By: Elsie Stain M.D.   On: 11/13/2017 13:40   Dg Abd Portable 1v-small Bowel Obstruction Protocol-initial, 8 Hr Delay  Result Date: 11/14/2017 CLINICAL DATA:  Small bowel obstruction.  Delayed film. EXAM: PORTABLE ABDOMEN - 1 VIEW COMPARISON:  Radiograph yesterday 2219 hour FINDINGS: Administered enteric contrast is visualized throughout the ascending, transverse, and descending and rectosigmoid colon. No small bowel dilatation. Enteric tube tip and side-port below the diaphragm in the stomach. Some residual high-density contrast remains in the gastric lumen. IMPRESSION: Administered enteric contrast seen throughout the entire colon. Electronically Signed   By: Lujean Rave.D.  On: 11/14/2017 04:32   Dg Abd Portable 1v  Result Date: 11/13/2017 CLINICAL DATA:  Nasogastric tube placement EXAM: PORTABLE ABDOMEN - 1 VIEW COMPARISON:  11/13/2017 FINDINGS: Nasogastric tube side port is within the stomach. The tip is near the gastroduodenal junction. The examination is otherwise unchanged. IMPRESSION: NG tube tip at the gastroduodenal junction and side port within the stomach. Electronically Signed   By: Deatra Robinson M.D.   On: 11/13/2017 22:33   Dg Abd Portable 1v-small Bowel Protocol-position  Verification  Result Date: 11/13/2017 CLINICAL DATA:  Evaluate placement of NG tube. EXAM: PORTABLE ABDOMEN - 1 VIEW COMPARISON:  11/13/2017 FINDINGS: The nasogastric tube tip projects over the body of the stomach. There is contrast opacification of the stomach. Assuming the contrast was administered via the NG tube at the time this radiograph was obtained this confirms intraluminal placement. IMPRESSION: 1. Tip of NG tube projects over the stomach. 2. Contrast opacification of the gastric lumen which, assuming was administered via the NG tube at the time this radiograph was obtained, confirms intraluminal placement Electronically Signed   By: Signa Kell M.D.   On: 11/13/2017 20:34    Anti-infectives: Anti-infectives (From admission, onward)   None       Assessment/Plan Ileus likely secondary to recent MI  -tolerating clears well. -advance to heart healthy diet as he is having multiple BMs and no nausea. -no surgical indications. -we will sign off.  Please call back if needed.  Type 2 diabetes mellitus with complication, without long-term current use of insulin (HCC) HTN (hypertension), benign ST elevation myocardial infarction involving left circumflex coronary artery (HCC) Acute ST elevation myocardial infarction (STEMI) of inferolateral wall (HCC) Stage III chronic kidney disease (HCC) Acute on chronic combined systolic and diastolic CHF (congestive heart failure) (HCC) Sleep apnea Obesity  FEN - heart healthy diet VTE - ASA, heparin ID - none   LOS: 5 days    Letha Cape , [facility] 11/15/2017, 9:18 AM Pager: [phone]

## 2017-11-16 LAB — CBC
HCT: 38.6 % — ABNORMAL LOW (ref 39.0–52.0)
Hemoglobin: 12.2 g/dL — ABNORMAL LOW (ref 13.0–17.0)
MCH: 28.1 pg (ref 26.0–34.0)
MCHC: 31.6 g/dL (ref 30.0–36.0)
MCV: 88.9 fL (ref 78.0–100.0)
Platelets: 213 10*3/uL (ref 150–400)
RBC: 4.34 MIL/uL (ref 4.22–5.81)
RDW: 13.2 % (ref 11.5–15.5)
WBC: 8.7 10*3/uL (ref 4.0–10.5)

## 2017-11-16 LAB — BASIC METABOLIC PANEL
Anion gap: 8 (ref 5–15)
BUN: 14 mg/dL (ref 6–20)
CO2: 26 mmol/L (ref 22–32)
Calcium: 9.5 mg/dL (ref 8.9–10.3)
Chloride: 105 mmol/L (ref 101–111)
Creatinine, Ser: 1.46 mg/dL — ABNORMAL HIGH (ref 0.61–1.24)
GFR calc Af Amer: 49 mL/min — ABNORMAL LOW (ref >60)
GFR calc non Af Amer: 42 mL/min — ABNORMAL LOW (ref >60)
Glucose, Bld: 119 mg/dL — ABNORMAL HIGH (ref 65–99)
Potassium: 4.1 mmol/L (ref 3.5–5.1)
Sodium: 139 mmol/L (ref 135–145)

## 2017-11-16 LAB — MAGNESIUM: Magnesium: 1.9 mg/dL (ref 1.7–2.4)

## 2017-11-16 LAB — GLUCOSE, CAPILLARY
Glucose-Capillary: 103 mg/dL — ABNORMAL HIGH (ref 65–99)
Glucose-Capillary: 106 mg/dL — ABNORMAL HIGH (ref 65–99)
Glucose-Capillary: 111 mg/dL — ABNORMAL HIGH (ref 65–99)
Glucose-Capillary: 117 mg/dL — ABNORMAL HIGH (ref 65–99)
Glucose-Capillary: 125 mg/dL — ABNORMAL HIGH (ref 65–99)
Glucose-Capillary: 163 mg/dL — ABNORMAL HIGH (ref 65–99)

## 2017-11-16 MED ORDER — METOPROLOL SUCCINATE ER 25 MG PO TB24
12.5000 mg | ORAL_TABLET | Freq: Every day | ORAL | Status: DC
  Administered 2017-11-16 – 2017-11-18 (×3): 12.5 mg via ORAL
  Filled 2017-11-16 (×3): qty 1

## 2017-11-16 NOTE — Progress Notes (Addendum)
Progress Note  Patient Name: [name] Date of Encounter: 11/16/2017  Primary Cardiologist: Thurmon Fair, MD   Subjective   Loose stools. SOme abdominal pain. Mild stable SOB.   Inpatient Medications    Scheduled Meds: . aspirin EC  81 mg Oral Daily  . heparin  5,000 Units Subcutaneous Q8H  . insulin aspart  0-9 Units Subcutaneous Q4H  . latanoprost  1 drop Both Eyes QHS  . losartan  50 mg Oral Daily  . mouth rinse  15 mL Mouth Rinse BID  . pravastatin  40 mg Oral Daily  . sertraline  50 mg Oral QHS  . sodium chloride flush  3 mL Intravenous Q12H  . ticagrelor  90 mg Oral BID   Continuous Infusions:  PRN Meds: morphine injection, nitroGLYCERIN, ondansetron (ZOFRAN) IV, sodium chloride flush   Vital Signs    Vitals:   11/16/17 0038 11/16/17 0534 11/16/17 0731 11/16/17 1041  BP: 140/89 (!) 141/93 (!) 155/89 (!) 148/93  Pulse: 96 99 (!) 101   Resp: 13 19    Temp: 100.2 F (37.9 C) 98.3 F (36.8 C) 100.3 F (37.9 C)   TempSrc: Oral Oral Oral   SpO2: 99% 98% 100%   Weight:  232 lb 4.8 oz (105.4 kg)    Height:        Intake/Output Summary (Last 24 hours) at 11/16/2017 1053 Last data filed at 11/16/2017 0900 Gross per 24 hour  Intake 600 ml  Output 1500 ml  Net -900 ml   Filed Weights   11/14/17 0505 11/15/17 0356 11/16/17 0534  Weight: 240 lb 8.4 oz (109.1 kg) 243 lb 9.7 oz (110.5 kg) 232 lb 4.8 oz (105.4 kg)    Telemetry    Sinus tach 100-110 - Personally Reviewed  ECG    na  Physical Exam   GEN: No acute distress.   Neck: No JVD Cardiac: RRR, no murmurs, rubs, or gallops.  Respiratory: Clear to auscultation bilaterally. GI: Soft, nontender, non-distended  MS: No edema; No deformity. Neuro:  Nonfocal  Psych: Normal affect   Labs    Chemistry Recent Labs  Lab 11/10/17 1100  11/11/17 0217  11/14/17 0455 11/15/17 0422 11/16/17 0312  NA 139   < > 138   < > 143 140 139  K 3.4*   < > 3.9   < > 3.4* 3.9 4.1  CL 106   < > 104   <  > 105 108 105  CO2 24  --  23   < > 27 27 26   GLUCOSE 144*   < > 121*   < > 114* 106* 119*  BUN 21*   < > 17   < > 11 10 14   CREATININE 1.55*   < > 1.31*   < > 1.32* 1.27* 1.46*  CALCIUM 8.9  --  8.7*   < > 8.8* 8.4* 9.5  PROT 6.9  --  6.8  --  6.6  --   --   ALBUMIN 3.5  --  3.4*  --  3.3*  --   --   AST 16  --  204*  --  35  --   --   ALT 12*  --  45  --  22  --   --   ALKPHOS 61  --  58  --  50  --   --   BILITOT 0.8  --  0.8  --  0.9  --   --  GFRNONAA 39*  --  48*   < > 48* 50* 42*  GFRAA 46*  --  56*   < > 55* 58* 49*  ANIONGAP 9  --  11   < > 11 5 8    < > = values in this interval not displayed.     Hematology Recent Labs  Lab 11/13/17 0245 11/13/17 1604 11/16/17 0312  WBC 7.0 5.9 8.7  RBC 4.20* 4.22 4.34  HGB 12.0* 12.0* 12.2*  HCT 38.2* 37.6* 38.6*  MCV 91.0 89.1 88.9  MCH 28.6 28.4 28.1  MCHC 31.4 31.9 31.6  RDW 13.6 13.6 13.2  PLT 166 197 213    Cardiac Enzymes Recent Labs  Lab 11/10/17 1500 11/11/17 0217 11/12/17 1436  TROPONINI 51.42* >65.00* 23.16*    Recent Labs  Lab 11/10/17 1109  TROPIPOC 0.03     BNPNo results for input(s): BNP, PROBNP in the last 168 hours.   DDimer No results for input(s): DDIMER in the last 168 hours.   Radiology    No results found.  Cardiac Studies   11/10/17 cath  Mid RCA lesion is 95% stenosed.  Dist RCA lesion is 80% stenosed.  Prox Cx to Mid Cx lesion is 90% stenosed.  Ost 2nd Mrg to 2nd Mrg lesion is 100% stenosed.  2nd Mrg lesion is 95% stenosed.  1st Diag lesion is 95% stenosed.  Mid LAD lesion is 80% stenosed.  Dist LAD lesion is 90% stenosed.  Post intervention, there is a 0% residual stenosis.  Post intervention, there is a 0% residual stenosis.  Post intervention, there is a 0% residual stenosis.  A stent was successfully placed.  A stent was successfully placed.  A stent was successfully placed.   Acute inferolateral ST segment elevation myocardial infarction secondary to total  occlusion of the circumflex vessel.  Multivessel CAD with 30% proximal and 95% distal diagonal 1 stenoses of the LAD, 80% mid distal and 90% distal LAD stenoses, and large dominant RCA with 95% mid stenosis and 80% distal stenosis prior to the PDA takeoff.    Successful complex percutaneous coronary intervention involving the proximal circumflex, proximal large OM vessel and distal inferior Aleksandr Pellow of this vessel after a large additional Edi Gorniak which cannot be visualized on the diagram above.  A 2.0 x 12 mm Resolute Onyx DES stent was placed in the distal inferior secondary Ezma Rehm of the marginal vessel and reduced to 0%; the site of 100% occlusion was treated with PTCA/DES stenting with a 2.5 x 18 mm Resolute DES stent positioned just proximal to the more distal bifurcation into a large Kasha Howeth and distal inferior Delailah Spieth with the 100% occlusion being reduced to 0% with post dilatation up to 3.0 mm; and the proximal 90% stenosis on a sharp bend of the vessel being reduced to 0% with ultimate insertion of a 3.0 x 18 mm Resolute stent postdilated to 3.34 mm.  LVEDP  24 mm Hg.     Patient Profile     84 y.o. male with ahistory of hypertension, type 2 diabetes mellitus, hyperlipidemia, diastolic dysfunction, prior history of syncope, orthostatic hypotension, CKD stage III secondary to diabetes and hypertension who presented with acute inferolateral MI on 11/10/2017 due to total occlusion of the circumflex in the setting of multivessel CAD. Complex interventional circumflex was completed with reperfusion. Residual significant RCA disease treated with staged PCI (DES to mid and distal RCA)and distal LAD disease (medical therapy recommended). Yesterday, he developed nausea, vomiting, and abdominal distension concerning for ileus.  IM and surgery consulted, NG tube was placed for gastric decompression. NG tube was removed last night and has been advanced to a heart healthy diet.     Assessment & Plan      1. Inferolateral STEMI, multivessel disease - admitted with inferolateral STEMI. Cath as reported above, received DES x 3 to LCX. Then staged PCI with DES x 2  to RCA - 11/2017 echo LVEF 40-45%, grade I diastolic dysfunction, inferolateral akinesis - medical therapy with ASA 81, losartan 50, prava 40, brillinta. Start toprol 12.5mg  daily. Was transiently on cangrelor when he was having issues with ileus and keeping pills down, now on brillinta.   2. Ileus - KUB with gastric distension - hospitalist and surgery were consulted - NG tube was placed for gastric decompression - ASA and brilinta were transitioned to ASA suppository and IV cangrelor - NG tube clamped and subsequently removed 11/14/17 - pt tolerated liquids and was advanced to heart healthy diet  - suspected etiology was immobility  - medicine helping monitor. From note patient taking solids, has had BMs.  - multiple loose stools, some abdominal pain. Likely resolving ileus or related to gastrograffin, but do to his hospitalization will check cdiff. Mild sinus tach, I suspect from his diarrhea and also recent diuresis. Normal WBC  3. AKI on CKD III - Cr today 1.46, mild fluctuations 1.27-1.45 over the last few days  4. Acute on chronic systolic/diastolic HF - - 11/2017 echo LVEF 40-45%, grade I diastolic dysfunction, inferolateral akinesis - has had some SOB without overt signs of volume overload. REceived lasix IV 40mg  x 1 yesterday, uptrend in Cr. Negative  5. Mobility PT ordered. Pt is legally blind and walks with a walker.   Dispo: from social work note plans to discharge to [place] when ready, awaiting insurance authorizatoin.  Monitor loose stools, mild abdominal pain, and sinus tach today.          For questions or updates, please contact [facility] Please consult [email or web address] for contact info under Cardiology/STEMI.      Joanie Coddington, MD  11/16/2017, 10:53 AM

## 2017-11-16 NOTE — Progress Notes (Addendum)
RN notified that patient having frequent pvcs, bigeminy and trigeminy.  Laverda Page NP notified.  Stated would check magnesium.  Patient is asymptomatic.

## 2017-11-16 NOTE — Progress Notes (Signed)
[facility] PROGRESS NOTE  [name]  OFB:[id number] DOB: 05-13-1931 DOA: 11/10/2017 PCP: Clayborn Heron, MD  Brief Narrative: [name] is an 84 y.o. male with a history of T2DM, hx CVA, OSA, HTN, HLD, stage III CKD and macular degeneration/glaucoma causing essential blindness who was admitted 6/9 by cardiology for STEMI, underwent staged PCI with recovery complicated by abdominal pain, distention and XR findings suggestive of ileus with prominent small bowel loop in the mid abdomen. Hospitalist and surgery teams were consulted. NG tube removed 6/13, advanced to clears and then heart healthy on 6/14. Developed some loose stool on 6/15. C. diff pending.   Subjective: Having watery stools 4 in total this morning every time he urinates. No blood, but did not examine very closely. Some abdominal pain with BMs but not otherwise. No N/V, eating ok. No fevers.   Objective: BP 125/80 (BP Location: Right Arm)   Pulse 100   Temp 100.2 F (37.9 C) (Oral)   Resp 19   Ht 5\' 10"  (1.778 m)   Wt 105.4 kg (232 lb 4.8 oz)   SpO2 95%   BMI 33.33 kg/m   Gen: Pleasant, obese male in no distress Pulm: Nonlabored breathing room air. Clear. CV: Regular rate and rhythm with occasional premature beat. No murmur, rub, or gallop. No JVD, no significant dependent edema. GI: Abdomen soft, mildly tender without focality on the right side without HSM. +BS. Remains mildly distended.  Ext: Warm, no deformities Skin: No rashes, lesions or ulcers on visualized skin.  Neuro: Alert and oriented. Limited visual acuity. No other focal neurological deficits. Psych: Judgement and insight appear fair. Mood euthymic & affect congruent. Behavior is appropriate.    Assessment & Plan: Ileus: Likely related to immobility following coronary procedures. Seems to have resolved.   Diarrhea: Described as watery >3 in past 24 hours. Abdominal pain has not changed, so this is possibly related to ongoing  resolution of ileus, expelling contrast. No fever or leukocytosis or acute abdomen. Has not received abx here. - Beginning to get more tachycardic (?diuresis-related) and fever curve while not technically febrile is showing subtle upward trend. Recommend checking CBC in AM, monitoring abdominal exam closely.  - CDiff ordered but not sent. Empiric precautions.   CAD: s/p DES x3 6/9 and staged PCI w/DES x2 6/11.  - Per primary. Will need DAPT at least 1 year. See plan to reload brilinta.  Acute on chronic HFrEF:  - Monitor volume status closely. Stop IVF, give lasix now that taking po and developing some edema (this has already been ordered).   T2DM: CBGs at goal. - SSI  HTN: Has room to start guideline-directed therapy once taking po. - IV hydralazine ordered prn. Defer management to cardiology.   Stage III CKD: Near baseline - Continue monitoring.  OSA:  - CPAP  Hypokalemia: Resolved.   [name], MD [facility] [email or web address] Password [facility] 11/16/2017, 5:29 PM

## 2017-11-17 LAB — BASIC METABOLIC PANEL
Anion gap: 8 (ref 5–15)
BUN: 20 mg/dL (ref 6–20)
CO2: 25 mmol/L (ref 22–32)
Calcium: 8.9 mg/dL (ref 8.9–10.3)
Chloride: 105 mmol/L (ref 101–111)
Creatinine, Ser: 1.58 mg/dL — ABNORMAL HIGH (ref 0.61–1.24)
GFR calc Af Amer: 45 mL/min — ABNORMAL LOW (ref >60)
GFR calc non Af Amer: 38 mL/min — ABNORMAL LOW (ref >60)
Glucose, Bld: 100 mg/dL — ABNORMAL HIGH (ref 65–99)
Potassium: 3.3 mmol/L — ABNORMAL LOW (ref 3.5–5.1)
Sodium: 138 mmol/L (ref 135–145)

## 2017-11-17 LAB — GLUCOSE, CAPILLARY
Glucose-Capillary: 114 mg/dL — ABNORMAL HIGH (ref 65–99)
Glucose-Capillary: 123 mg/dL — ABNORMAL HIGH (ref 65–99)
Glucose-Capillary: 129 mg/dL — ABNORMAL HIGH (ref 65–99)
Glucose-Capillary: 138 mg/dL — ABNORMAL HIGH (ref 65–99)
Glucose-Capillary: 177 mg/dL — ABNORMAL HIGH (ref 65–99)
Glucose-Capillary: 91 mg/dL (ref 65–99)

## 2017-11-17 LAB — CBC
HCT: 37.6 % — ABNORMAL LOW (ref 39.0–52.0)
Hemoglobin: 12 g/dL — ABNORMAL LOW (ref 13.0–17.0)
MCH: 28.8 pg (ref 26.0–34.0)
MCHC: 31.9 g/dL (ref 30.0–36.0)
MCV: 90.2 fL (ref 78.0–100.0)
Platelets: 226 10*3/uL (ref 150–400)
RBC: 4.17 MIL/uL — ABNORMAL LOW (ref 4.22–5.81)
RDW: 13.7 % (ref 11.5–15.5)
WBC: 11.5 10*3/uL — ABNORMAL HIGH (ref 4.0–10.5)

## 2017-11-17 MED ORDER — POTASSIUM CHLORIDE CRYS ER 20 MEQ PO TBCR
40.0000 meq | EXTENDED_RELEASE_TABLET | Freq: Two times a day (BID) | ORAL | Status: AC
  Administered 2017-11-17 (×2): 40 meq via ORAL
  Filled 2017-11-17 (×2): qty 2

## 2017-11-17 MED ORDER — INSULIN ASPART 100 UNIT/ML ~~LOC~~ SOLN
0.0000 [IU] | Freq: Three times a day (TID) | SUBCUTANEOUS | Status: DC
  Administered 2017-11-17: 1 [IU] via SUBCUTANEOUS
  Administered 2017-11-18: 2 [IU] via SUBCUTANEOUS

## 2017-11-17 MED ORDER — INSULIN ASPART 100 UNIT/ML ~~LOC~~ SOLN: 0.0000 [IU] | Freq: Three times a day (TID) | SUBCUTANEOUS | Status: DC

## 2017-11-17 NOTE — Progress Notes (Signed)
Patient refused CPAP at this time. Patient states he does not wear CPAP at home.

## 2017-11-17 NOTE — Progress Notes (Addendum)
Progress Note  Patient Name: [name] Date of Encounter: 11/17/2017  Primary Cardiologist: Thurmon Fair, MD   Subjective   Ongoing loose stools. Mild SOB  Inpatient Medications    Scheduled Meds: . aspirin EC  81 mg Oral Daily  . heparin  5,000 Units Subcutaneous Q8H  . insulin aspart  0-9 Units Subcutaneous Q4H  . latanoprost  1 drop Both Eyes QHS  . losartan  50 mg Oral Daily  . mouth rinse  15 mL Mouth Rinse BID  . metoprolol succinate  12.5 mg Oral Daily  . pravastatin  40 mg Oral Daily  . sertraline  50 mg Oral QHS  . sodium chloride flush  3 mL Intravenous Q12H  . ticagrelor  90 mg Oral BID   Continuous Infusions:  PRN Meds: morphine injection, nitroGLYCERIN, ondansetron (ZOFRAN) IV, sodium chloride flush   Vital Signs    Vitals:   11/16/17 1620 11/16/17 1944 11/17/17 0028 11/17/17 0444  BP: 125/80 (!) 117/96 106/81 111/81  Pulse: 100 96 92 81  Resp:  18 (!) 25 (!) 21  Temp: 100.2 F (37.9 C) 98.9 F (37.2 C) 98.4 F (36.9 C) 98 F (36.7 C)  TempSrc: Oral Oral Axillary Axillary  SpO2: 95% 98% 98% 100%  Weight:    227 lb 9.6 oz (103.2 kg)  Height:        Intake/Output Summary (Last 24 hours) at 11/17/2017 0755 Last data filed at 11/17/2017 0447 Gross per 24 hour  Intake 660 ml  Output 925 ml  Net -265 ml   Filed Weights   11/15/17 0356 11/16/17 0534 11/17/17 0444  Weight: 243 lb 9.7 oz (110.5 kg) 232 lb 4.8 oz (105.4 kg) 227 lb 9.6 oz (103.2 kg)    Telemetry    SR, sinus tach, PVCs - Personally Reviewed  ECG    na  Physical Exam   GEN: No acute distress.   Neck: No JVD Cardiac: RRR, no murmurs, rubs, or gallops.  Respiratory: Clear to auscultation bilaterally. GI: Soft, nontender, non-distended  MS: No edema; No deformity. SMall palpable right groin hematoma.  Neuro:  Nonfocal  Psych: Normal affect   Labs    Chemistry Recent Labs  Lab 11/10/17 1100  11/11/17 0217  11/14/17 0455 11/15/17 0422 11/16/17 0312  11/17/17 0410  NA 139   < > 138   < > 143 140 139 138  K 3.4*   < > 3.9   < > 3.4* 3.9 4.1 3.3*  CL 106   < > 104   < > 105 108 105 105  CO2 24  --  23   < > 27 27 26 25   GLUCOSE 144*   < > 121*   < > 114* 106* 119* 100*  BUN 21*   < > 17   < > 11 10 14 20   CREATININE 1.55*   < > 1.31*   < > 1.32* 1.27* 1.46* 1.58*  CALCIUM 8.9  --  8.7*   < > 8.8* 8.4* 9.5 8.9  PROT 6.9  --  6.8  --  6.6  --   --   --   ALBUMIN 3.5  --  3.4*  --  3.3*  --   --   --   AST 16  --  204*  --  35  --   --   --   ALT 12*  --  45  --  22  --   --   --  ALKPHOS 61  --  58  --  50  --   --   --   BILITOT 0.8  --  0.8  --  0.9  --   --   --   GFRNONAA 39*  --  48*   < > 48* 50* 42* 38*  GFRAA 46*  --  56*   < > 55* 58* 49* 45*  ANIONGAP 9  --  11   < > 11 5 8 8    < > = values in this interval not displayed.     Hematology Recent Labs  Lab 11/13/17 1604 11/16/17 0312 11/17/17 0410  WBC 5.9 8.7 11.5*  RBC 4.22 4.34 4.17*  HGB 12.0* 12.2* 12.0*  HCT 37.6* 38.6* 37.6*  MCV 89.1 88.9 90.2  MCH 28.4 28.1 28.8  MCHC 31.9 31.6 31.9  RDW 13.6 13.2 13.7  PLT 197 213 226    Cardiac Enzymes Recent Labs  Lab 11/10/17 1500 11/11/17 0217 11/12/17 1436  TROPONINI 51.42* >65.00* 23.16*    Recent Labs  Lab 11/10/17 1109  TROPIPOC 0.03     BNPNo results for input(s): BNP, PROBNP in the last 168 hours.   DDimer No results for input(s): DDIMER in the last 168 hours.   Radiology    No results found.  Cardiac Studies   11/10/17 cath  Mid RCA lesion is 95% stenosed.  Dist RCA lesion is 80% stenosed.  Prox Cx to Mid Cx lesion is 90% stenosed.  Ost 2nd Mrg to 2nd Mrg lesion is 100% stenosed.  2nd Mrg lesion is 95% stenosed.  1st Diag lesion is 95% stenosed.  Mid LAD lesion is 80% stenosed.  Dist LAD lesion is 90% stenosed.  Post intervention, there is a 0% residual stenosis.  Post intervention, there is a 0% residual stenosis.  Post intervention, there is a 0% residual stenosis.  A  stent was successfully placed.  A stent was successfully placed.  A stent was successfully placed.  Acute inferolateral ST segment elevation myocardial infarction secondary to total occlusion of the circumflex vessel.  Multivessel CAD with 30% proximal and 95% distal diagonal 1 stenoses of the LAD, 80% mid distal and 90% distal LAD stenoses, and large dominant RCA with 95% mid stenosis and 80% distal stenosis prior to the PDA takeoff.   Successful complex percutaneous coronary intervention involving the proximal circumflex, proximal large OM vessel and distal inferior Jamayia Croker of this vessel after a large additional Sharanda Shinault which cannot be visualized on the diagram above. A 2.0 x 12 mm Resolute Onyx DES stent was placed in the distal inferior secondary Naomii Kreger of the marginal vessel and reduced to 0%; the site of 100% occlusion was treated with PTCA/DES stenting with a 2.5 x 18 mm Resolute DES stent positioned just proximal to the more distal bifurcation into a large Kaori Jumper and distal inferior Adolpho Meenach with the 100% occlusion being reduced to 0% with post dilatation up to 3.0 mm; and the proximal 90% stenosis on a sharp bend of the vessel being reduced to 0% with ultimate insertion of a 3.0 x 18 mm Resolute stent postdilated to 3.34 mm.  LVEDP 24 mm Hg.     Patient Profile     84 y.o.malewith ahistory of hypertension, type 2 diabetes mellitus, hyperlipidemia, diastolic dysfunction, prior history of syncope, orthostatic hypotension, CKD stage III secondary to diabetes and hypertension who presented with acute inferolateral MI on 11/10/2017 due to total occlusion of the circumflex in the setting of multivessel CAD. Complex  interventional circumflex was completed with reperfusion. Residual significant RCA disease treated with staged PCI (DES to mid and distal RCA)and distal LAD disease (medical therapy recommended). Yesterday, he developed nausea, vomiting, and abdominal distension concerning  for ileus. IM and surgery consulted, NG tubewas placed for gastric decompression. NG tube was removed last night and has been advanced to a heart healthy diet.    Assessment & Plan   1. Inferolateral STEMI, multivessel disease - admitted with inferolateral STEMI. Cath as reported above, received DES x 3 to LCX. Then staged PCI with DES x 2  to RCA - 11/2017 echo LVEF 40-45%, grade I diastolic dysfunction, inferolateral akinesis - medical therapy with ASA 81, losartan 50, prava 40, brillinta. Start toprol 12.5mg  daily. Was transiently on cangrelor when he was having issues with ileus and keeping pills down, now on brillinta.   2. Ileus - KUB with gastric distension - hospitalist and surgerywereconsulted - NG tube was placed for gastric decompression - ASA and brilinta were transitioned to ASA suppository and IV cangrelor - NG tube clamped and subsequently removed 11/14/17 - pt tolerated liquids and was advanced to heart healthy diet - suspected etiology was immobility  - medicine helping monitor. From note patient taking solids, has had BMs.  - multiple loose stools, some abdominal pain. Likely resolving ileus or related to gastrograffin, but do to his hospitalization we have ordered cdiff, though has not had abx. Does have mild uptrend in WBC.  Mild sinus tach, I suspect from his diarrhea and also recent diuresis.   3. AKI on CKD III - Cr today 1.58, mild fluctuations. Suspect due to prior diuresis and current diarrhea - hold diuretics and follow renal function  4. Hypokalemia - replace orally  5. Acute on chronic systolic/diastolic HF - - 11/2017 echo LVEF 40-45%, grade I diastolic dysfunction, inferolateral akinesis - has had some SOB without overt signs of volume overload. - with AKI holding diuretics.   5. Mobility PT ordered. Pt is legally blind and walks with a walker.  6. Small right groin hematoma - small palpable hematoma right groin at cath site, mild  discomfort to palpation. Hgb stabel since last cath. Continue to follow by exam.   Dispo: from social work note plans to discharge to [facility] when ready, awaiting insurance authorizatoin.  Monitor loose stools, WBCs, and Cr today. Pending trends and social work status tomorrow may be eligible for discharge.     For questions or updates, please contact [facility] Please consult [email or web address] for contact info under Cardiology/STEMI.      Joanie Coddington, MD  11/17/2017, 7:55 AM

## 2017-11-17 NOTE — Progress Notes (Signed)
Got update from RN. Pt has not had any further loose stools. Will cancel CDiff PCR and enteric precautions.   Hazeline Junker, MD 11/17/2017 5:55 PM

## 2017-11-17 NOTE — Progress Notes (Signed)
[facility] PROGRESS NOTE  TRACIE SUCHECKI  LKJ:[id number] DOB: June 16, 1930 DOA: 11/10/2017 PCP: Clayborn Heron, MD  Brief Narrative: [name] is an 84 y.o. male with a history of T2DM, hx CVA, OSA, HTN, HLD, stage III CKD and macular degeneration/glaucoma causing essential blindness who was admitted 6/9 by cardiology for STEMI, underwent staged PCI with recovery complicated by abdominal pain, distention and XR findings suggestive of ileus with prominent small bowel loop in the mid abdomen. Hospitalist and surgery teams were consulted. NG tube removed 6/13, advanced to clears and then heart healthy on 6/14. Developed some loose stool on 6/15 that he reports is decreasing on 6/16 with improved, decreasing abdominal pain. C. diff pending.   Subjective: Last loose stool was overnight. None yet this AM. Had full breakfast without N/V/fecal urgency. Feels right sided abdominal pain has let up. No fevers. Denies cough, dyspnea, dysuria.   Objective: BP 127/83 (BP Location: Right Arm)   Pulse 82   Temp 98.4 F (36.9 C) (Axillary)   Resp 17   Ht 5\' 10"  (1.778 m)   Wt 103.2 kg (227 lb 9.6 oz)   SpO2 100%   BMI 32.66 kg/m   Gen: Pleasant, obese male in no distress resting with eyes closed. Pulm: Nonlabored breathing room air. Clear. CV: Regular rate and rhythm with occasional premature beat. No murmur, rub, or gallop. No JVD, no significant dependent edema. GI: Abdomen soft, no appreciable tenderness on exam today. No HSM, normoactive bowel sounds. Protuberant.   Assessment & Plan: Ileus: Likely related to immobility following coronary procedures. Seems to have resolved.  - Continue OOB as much as possible.  Diarrhea: Decreasing in frequency without intervention. No fever or abdominal pain. Leukocytosis is mild, upward trend but again without fever or any history/exam findings to indicate focal infection.  - Monitor CBC. If not continuing to rise and remains afebrile, would  not warrant further work up.  - CDiff is ordered but still not sent. Pt reports improving diarrhea with no interventions. If continues to improve, would NOT send CDiff. Empiric precautions.   Hypokalemia: Mild, due to GI losses.  - Repalce orally and recheck in AM  CAD: s/p DES x3 6/9 and staged PCI w/DES x2 6/11.  - Per primary. Will need DAPT at least 1 year. See plan to reload brilinta.  Acute on chronic HFrEF:  - Monitor volume status closely. Stop IVF, give lasix now that taking po and developing some edema (this has already been ordered).   T2DM: CBGs at goal. - SSI  HTN: Has room to start guideline-directed therapy once taking po. - IV hydralazine ordered prn. Defer management to cardiology.   Stage III CKD: Near baseline. Had mild AKI on admission and improved, not trending upward thought to be due to limited po, GI losses. Holding diuretic per primary - Continue monitoring in AM  OSA:  - CPAP  [name], MD [facility] [email or web address] Password [email or web address] 11/17/2017, 9:40 AM

## 2017-11-18 ENCOUNTER — Telehealth: Payer: Self-pay | Admitting: Cardiology

## 2017-11-18 DIAGNOSIS — E118 Type 2 diabetes mellitus with unspecified complications: Secondary | ICD-10-CM

## 2017-11-18 DIAGNOSIS — G4733 Obstructive sleep apnea (adult) (pediatric): Secondary | ICD-10-CM

## 2017-11-18 LAB — BASIC METABOLIC PANEL
Anion gap: 9 (ref 5–15)
BUN: 25 mg/dL — ABNORMAL HIGH (ref 6–20)
CO2: 21 mmol/L — ABNORMAL LOW (ref 22–32)
Calcium: 8.9 mg/dL (ref 8.9–10.3)
Chloride: 107 mmol/L (ref 101–111)
Creatinine, Ser: 1.69 mg/dL — ABNORMAL HIGH (ref 0.61–1.24)
GFR calc Af Amer: 41 mL/min — ABNORMAL LOW (ref >60)
GFR calc non Af Amer: 35 mL/min — ABNORMAL LOW (ref >60)
Glucose, Bld: 147 mg/dL — ABNORMAL HIGH (ref 65–99)
Potassium: 4.3 mmol/L (ref 3.5–5.1)
Sodium: 137 mmol/L (ref 135–145)

## 2017-11-18 LAB — CBC
HCT: 38.4 % — ABNORMAL LOW (ref 39.0–52.0)
Hemoglobin: 12.1 g/dL — ABNORMAL LOW (ref 13.0–17.0)
MCH: 28 pg (ref 26.0–34.0)
MCHC: 31.5 g/dL (ref 30.0–36.0)
MCV: 88.9 fL (ref 78.0–100.0)
Platelets: 214 10*3/uL (ref 150–400)
RBC: 4.32 MIL/uL (ref 4.22–5.81)
RDW: 13.5 % (ref 11.5–15.5)
WBC: 6.4 10*3/uL (ref 4.0–10.5)

## 2017-11-18 LAB — GLUCOSE, CAPILLARY
Glucose-Capillary: 110 mg/dL — ABNORMAL HIGH (ref 65–99)
Glucose-Capillary: 194 mg/dL — ABNORMAL HIGH (ref 65–99)
Glucose-Capillary: 109 mg/dL — ABNORMAL HIGH (ref 65–99)

## 2017-11-18 MED ORDER — ATORVASTATIN CALCIUM 80 MG PO TABS: 80.0000 mg | ORAL_TABLET | Freq: Every day | ORAL | 11 refills | Status: DC

## 2017-11-18 MED ORDER — NITROGLYCERIN 0.4 MG SL SUBL: 0.4000 mg | SUBLINGUAL_TABLET | SUBLINGUAL | 12 refills | Status: DC | PRN

## 2017-11-18 MED ORDER — ASPIRIN 81 MG PO TBEC: 81.0000 mg | DELAYED_RELEASE_TABLET | Freq: Every day | ORAL | Status: DC

## 2017-11-18 MED ORDER — TICAGRELOR 90 MG PO TABS: 90.0000 mg | ORAL_TABLET | Freq: Two times a day (BID) | ORAL | 11 refills | Status: DC

## 2017-11-18 MED ORDER — TICAGRELOR 90 MG PO TABS: 90.0000 mg | ORAL_TABLET | Freq: Two times a day (BID) | ORAL | 0 refills | Status: DC

## 2017-11-18 MED ORDER — METOPROLOL SUCCINATE ER 25 MG PO TB24: 12.5000 mg | ORAL_TABLET | Freq: Every day | ORAL | 11 refills | Status: DC

## 2017-11-18 NOTE — Progress Notes (Addendum)
9:13 am CSW spoke to [employer]. They have renewed patient's authorization for SNF effective today, 11/18/17. Patient can admit to SNF today if medically stable. CSW to support with discharge.  8:36 am CSW received message from [facility] from late Friday evening that patient was approved for transfer to SNF over the weekend. However, new authorization will be needed today since patient did not discharge over the weekend. CSW to send updated clinicals, including updated PT note to [facility] when available. CSW to follow.  Abigail Butts, LCSWA [phone]

## 2017-11-18 NOTE — Progress Notes (Signed)
[name] has insurance authorization for patient to admit today. Patient's daughter going to the facility to complete admissions paperwork this afternoon.  Patient will discharge to [facility]. Anticipated discharge date: 11/18/17 Family notified: [name], daughter Transportation by: [facility]  Nurse to call report to [phone].   CSW signing off.  Abigail Butts, LCSWA  Clinical Social Worker

## 2017-11-18 NOTE — Progress Notes (Addendum)
Progress Note  Patient Name: [name] Date of Encounter: 11/18/2017  Primary Cardiologist: Thurmon Fair, MD   Subjective   Some upper R chest pain, RUQ abd is tender. Prev CP was mid-sternal. Denies SOB. Still having loose stools.   Inpatient Medications    Scheduled Meds: . aspirin EC  81 mg Oral Daily  . heparin  5,000 Units Subcutaneous Q8H  . insulin aspart  0-9 Units Subcutaneous TID WC  . latanoprost  1 drop Both Eyes QHS  . losartan  50 mg Oral Daily  . mouth rinse  15 mL Mouth Rinse BID  . metoprolol succinate  12.5 mg Oral Daily  . pravastatin  40 mg Oral Daily  . sertraline  50 mg Oral QHS  . sodium chloride flush  3 mL Intravenous Q12H  . ticagrelor  90 mg Oral BID   Continuous Infusions:  PRN Meds: morphine injection, nitroGLYCERIN, ondansetron (ZOFRAN) IV, sodium chloride flush   Vital Signs    Vitals:   11/17/17 1941 11/18/17 0021 11/18/17 0549 11/18/17 0744  BP: 134/87 137/85 (!) 121/91 107/81  Pulse: 62 94 93 96  Resp: (!) 24 20 (!) 22   Temp: 98.9 F (37.2 C) (!) 100.5 F (38.1 C) 99.8 F (37.7 C) 99.6 F (37.6 C)  TempSrc: Oral Axillary Oral Oral  SpO2: 100% 98% 100% 99%  Weight:   227 lb 1.6 oz (103 kg)   Height:        Intake/Output Summary (Last 24 hours) at 11/18/2017 0756 Last data filed at 11/18/2017 0600 Gross per 24 hour  Intake 1080 ml  Output 175 ml  Net 905 ml   Filed Weights   11/16/17 0534 11/17/17 0444 11/18/17 0549  Weight: 232 lb 4.8 oz (105.4 kg) 227 lb 9.6 oz (103.2 kg) 227 lb 1.6 oz (103 kg)    Telemetry    SR, sinus tach, frequent PVCs with trigeminy - Personally Reviewed  ECG    None today  Physical Exam   GEN: No acute distress.   Neck: No JVD Cardiac: RRR, no murmurs, rubs, or gallops.  Respiratory: few scattered rales bilaterally. GI: Soft, tender RUQ, non-distended  MS: No edema; No deformity. Small R groin hematoma.  Neuro:  Nonfocal  Psych: Normal affect   Labs     Chemistry Recent Labs  Lab 11/14/17 0455  11/16/17 0312 11/17/17 0410 11/18/17 0413  NA 143   < > 139 138 137  K 3.4*   < > 4.1 3.3* 4.3  CL 105   < > 105 105 107  CO2 27   < > 26 25 21*  GLUCOSE 114*   < > 119* 100* 147*  BUN 11   < > 14 20 25*  CREATININE 1.32*   < > 1.46* 1.58* 1.69*  CALCIUM 8.8*   < > 9.5 8.9 8.9  PROT 6.6  --   --   --   --   ALBUMIN 3.3*  --   --   --   --   AST 35  --   --   --   --   ALT 22  --   --   --   --   ALKPHOS 50  --   --   --   --   BILITOT 0.9  --   --   --   --   GFRNONAA 48*   < > 42* 38* 35*  GFRAA 55*   < > 49* 45*  41*  ANIONGAP 11   < > 8 8 9    < > = values in this interval not displayed.     Hematology Recent Labs  Lab 11/16/17 0312 11/17/17 0410 11/18/17 0413  WBC 8.7 11.5* 6.4  RBC 4.34 4.17* 4.32  HGB 12.2* 12.0* 12.1*  HCT 38.6* 37.6* 38.4*  MCV 88.9 90.2 88.9  MCH 28.1 28.8 28.0  MCHC 31.6 31.9 31.5  RDW 13.2 13.7 13.5  PLT 213 226 214    Cardiac Enzymes Recent Labs  Lab 11/12/17 1436  TROPONINI 23.16*     Radiology    No results found.  Cardiac Studies   11/10/17 cath  Mid RCA lesion is 95% stenosed.  Dist RCA lesion is 80% stenosed.  Prox Cx to Mid Cx lesion is 90% stenosed.  Ost 2nd Mrg to 2nd Mrg lesion is 100% stenosed.  2nd Mrg lesion is 95% stenosed.  1st Diag lesion is 95% stenosed.  Mid LAD lesion is 80% stenosed.  Dist LAD lesion is 90% stenosed.  Post intervention, there is a 0% residual stenosis.  Post intervention, there is a 0% residual stenosis.  Post intervention, there is a 0% residual stenosis.  A stent was successfully placed.  A stent was successfully placed.  A stent was successfully placed. Acute inferolateral ST segment elevation myocardial infarction secondary to total occlusion of the circumflex vessel. Multivessel CAD with 30% proximal and 95% distal diagonal 1 stenoses of the LAD, 80% mid distal and 90% distal LAD stenoses, and large dominant RCA with 95%  mid stenosis and 80% distal stenosis prior to the PDA takeoff.  Successful complex percutaneous coronary intervention involving the proximal circumflex, proximal large OM vessel and distal inferior branch of this vessel after a large additional branch which cannot be visualized on the diagram above. A 2.0 x 12 mm Resolute Onyx DES stent was placed in the distal inferior secondary branch of the marginal vessel and reduced to 0%; the site of 100% occlusion was treated with PTCA/DES stenting with a 2.5 x 18 mm Resolute DES stent positioned just proximal to the more distal bifurcation into a large branch and distal inferior branch with the 100% occlusion being reduced to 0% with post dilatation up to 3.0 mm; and the proximal 90% stenosis on a sharp bend of the vessel being reduced to 0% with ultimate insertion of a 3.0 x 18 mm Resolute stent postdilated to 3.34 mm. LVEDP 24 mm Hg.  11/12/2017  1st Diag lesion is 95% stenosed.  Mid LAD lesion is 70% stenosed.  Dist LAD lesion is 85% stenosed.  Previously placed 2nd Mrg stent (unknown type) is widely patent.  Previously placed Ost 2nd Mrg to 2nd Mrg stent (unknown type) is widely patent.  Previously placed Prox Cx to Mid Cx stent (unknown type) is widely patent.  Dist RCA lesion is 80% stenosed.  Post intervention, there is a 0% residual stenosis.  Prox RCA to Mid RCA lesion is 30% stenosed.  Mid RCA lesion is 95% stenosed.  A stent was successfully placed.  Post intervention, there is a 0% residual stenosis.  Post intervention, there is a 0% residual stenosis.  A stent was successfully placed.  Re-look of the left coronary circulation revealed widely patent stents extending from the proximal circumflex into the marginal vessel and also into the distal branch without restenosis.  The LAD had previously demonstrated 95% distal stenosis in a small caliber diagonal vessel, and the mid distal to distal LAD lesions appeared slightly  improved now 70 and 85%. Successful PCI to the RCA with DES stenting of the distal 80% stenosis reduced to 0% with a 2.25 x 12 mm Resolute Onyx stent postdilated to 2.4 mm, and the mid RCA stenosis of 95% with proximal eccentric narrowing stented with a 3.0 x 26 mm Resolute stent postdilated to 3.36 tapering to 3.25 with the entire stenosis being reduced to 0%. RECOMMENDATION: DAPT for minimum of 1 year.  The patient will be started on carvedilol in addition to amlodipine in light of his hypertension as well as concomitant CAD.  He is on high potency statin therapy.  ECHO: 11/11/2017 - Left ventricle: The cavity size was normal. There was moderate   concentric hypertrophy. Systolic function was mildly to   moderately reduced. The estimated ejection fraction was in the   range of 40% to 45%. Doppler parameters are consistent with   abnormal left ventricular relaxation (grade 1 diastolic   dysfunction). There was no evidence of elevated ventricular   filling pressure by Doppler parameters. - Aortic valve: Valve area (VTI): 2.08 cm^2. Valve area (Vmax):   2.13 cm^2. Valve area (Vmean): 1.98 cm^2. - Aortic root: The aortic root was normal in size. - Mitral valve: Valve area by pressure half-time: 1.28 cm^2. - Left atrium: The atrium was normal in size. - Right ventricle: The cavity size was normal. Wall thickness was   normal. Systolic function was normal. - Right atrium: The atrium was normal in size. - Tricuspid valve: There was no regurgitation. - Pulmonary arteries: Systolic pressure could not be accurately   estimated. - Inferior vena cava: The vessel was normal in size. - Pericardium, extracardiac: There was no pericardial effusion. Impressions: - There is akinesis of the basal and mid inferior, inefrolateral   and apical inferior leads.  Patient Profile     84 y.o.malewith ahistory of hypertension, type 2 diabetes mellitus, hyperlipidemia, diastolic dysfunction, prior  history of syncope, orthostatic hypotension, CKD stage III secondary to diabetes and hypertension who presented with acute inferolateral MI on 11/10/2017 due to total occlusion of the circumflex in the setting of multivessel CAD.   Complex interventional circumflex was completed with reperfusion. Residual significant RCA disease treated with staged PCI (DES to mid and distal RCA)and distal LAD disease (medical therapy recommended).   06/12, he developed nausea, vomiting, and abdominal distension concerning for ileus. IM and surgery consulted, NG tubewas placed for gastric decompression. NG tube was removed 06/13, pt has been advanced to a heart healthy diet.   Assessment & Plan   1. Inferolateral STEMI, multivessel disease - admitted with inferolateral STEMI. Cath reported above, received DES x 3 to LCX. Then staged PCI with DES x 2  to RCA - 11/2017 echo LVEF 40-45%, grade I diastolic dysfunction, inferolateral akinesis - medical therapy with ASA 81, losartan 50, prava 40, brillinta. Start toprol 12.5mg  daily. Was transiently on cangrelor when he was having issues with ileus and keeping pills down, now on Brillinta.  - no doses held  2. Ileus - KUB with gastric distension - hospitalist and surgerywereconsulted - NG tube was placed for gastric decompression>>removed 06/13 - pt tolerated liquids and was advanced to heart healthy diet - suspected etiology was immobility - BM this am was improved - medicine helping monitor. From note patient taking solids, has had BMs.  - some abdominal pain. Likely resolving ileus  - WBC trending down.    3. AKI on CKD III - Cr today 1.58>>1.69, trending up - Suspect due to  prior diuresis (no Lasix since 06/14) and diarrhea - diuretics are on hold, will hold ARB also, follow renal function, can do as outpt  4. Hypokalemia - replaced orally and ok  5. Acute on chronic systolic/diastolic HF - - 11/2017 echo LVEF 40-45%, grade I diastolic  dysfunction, inferolateral akinesis - has had some SOB without overt signs of volume overload. - follow daily weights - continue BB, SBP 100s at times, no dose change.  5. Mobility - PT ordered. They saw him 06/13, but need to see again. - Pt is legally blind and walks with a walker.  6. Small right groin hematoma - small palpable hematoma right groin at cath site, mild discomfort to palpation. Hgb stable since last cath. Continue to follow by exam.   Dispo: from social work note plans to discharge to [facility] when ready, since did not go over weekend, will contact [employer] for insurance re-authorization.  - Social work to see, is eligible for discharge today.   For questions or updates, please contact [facility] Please consult [email or web address] for contact info under Cardiology/STEMI.      Signed, Theodore Demark, PA-C  11/18/2017, 7:56 AM    I have seen and examined the patient along with Theodore Demark, PA-C .  I have reviewed the chart, notes and new data.  I agree with PA/NP's note.  Key new complaints: Denies chest pain, stomach still feels a little unsettled but improving quickly.  He had a large formed stool but is not having diarrhea Key examination changes: No clinical signs of heart failure, abdomen is not distended and bowel sounds are normal. Key new findings / data: No significant arrhythmia on telemetry  PLAN: I think he is ready for discharge to skilled nursing facility for rehab. His creatinine is still slowly trending upward so we will keep him off diuretics, but these will need to be restarted at some point in the future.  We will ask the nursing facility to check a basic metabolic panel towards the end of this week. Important that he receive daily weights at the nursing facility.  We should be called if his weight exceeds 240 pounds.  Note that at his last appointment with his nephrologist he was only receiving furosemide 20 mg once daily.  Due to reduction of left  ventricular systolic function, he may require a slightly higher dose in the future. Mandatory dual antiplatelet therapy for minimum of 6 months, preferably 12 months unless there are bleeding complications.  Thurmon Fair, MD, [facility] [phone] 11/18/2017, 10:04 AM

## 2017-11-18 NOTE — Progress Notes (Signed)
[facility] PROGRESS NOTE  [name]  FHL:[id number] DOB: 04/21/1931 DOA: 11/10/2017 PCP: Clayborn Heron, MD  Brief Narrative: [name] is an 84 y.o. male with a history of T2DM, hx CVA, OSA, HTN, HLD, stage III CKD and macular degeneration/glaucoma causing essential blindness who was admitted 6/9 by cardiology for STEMI, underwent staged PCI with recovery complicated by abdominal pain, distention and XR findings suggestive of ileus with prominent small bowel loop in the mid abdomen. Hospitalist and surgery teams were consulted. NG tube removed 6/13, advanced to clears and then heart healthy on 6/14. Developed some loose stool on 6/15 that he reports has improved, decreasing abdominal pain.    Subjective: Reports a BM with urination this morning but none reported yesterday. Eating ok, no nausea or vomiting. No subjective fevers, cough, dysuria.    Objective: BP 122/67 (BP Location: Left Arm)   Pulse 93   Temp 99.2 F (37.3 C) (Oral)   Resp (!) 22   Ht 5\' 10"  (1.778 m)   Wt 103 kg (227 lb 1.6 oz)   SpO2 98%   BMI 32.59 kg/m   Gen: No distress Pulm: Nonlabored and clear CV: RRR, no m/r/g or LE edema.  GI: soft, mildly tender in right abdomen without rebound or guarding. +BS.   Assessment & Plan: Ileus: Likely related to immobility following coronary procedures. Resolved.  Diarrhea: Decreasing in frequency without intervention. Leukocytosis resolved. Abdominal pain is improved, taking good po.  - Symptomatic management.    Hypokalemia: Mild, due to GI losses. Resolved. - Recheck BMP later this week at SNF.  CAD: s/p DES x3 6/9 and staged PCI w/DES x2 6/11.  - Per primary. Will need DAPT x 1 year.  Acute on chronic HFrEF:  - Monitor volume status closely with daily weights. With rising Cr, cardiology is holding diuretic.   Stage III CKD: Creatinine with upward trend (1.55 at admission and down to 1.2, now slow trend upward to 12.6, unclear baseline,  though Cr was widely swinging between 1.1 and 1.7 in 2016.  - Holding diuretic per primary - Continue monitoring this week  See plans for discharge today, medicine will sign off.   Ricky Nine, MD [facility] [email or web address] Password [facility] 11/18/2017, 11:31 AM

## 2017-11-18 NOTE — Progress Notes (Signed)
PT Cancellation Note  Patient Details Name: GARRIE COCCA MRN: [id number] DOB: 15-May-1931   Cancelled Treatment:    Reason Eval/Treat Not Completed: Other (comment)("I can't today.  I feel bad.")   Berline Lopes 11/18/2017, 3:25 PM  [facility] [phone] (pager)

## 2017-11-18 NOTE — Plan of Care (Signed)
  Problem: Education: Goal: Understanding of CV disease, CV risk reduction, and recovery process will improve Outcome: Completed/Met   Problem: Activity: Goal: Ability to return to baseline activity level will improve Outcome: Completed/Met   Problem: Cardiovascular: Goal: Ability to achieve and maintain adequate cardiovascular perfusion will improve Outcome: Completed/Met Goal: Vascular access site(s) Level 0-1 will be maintained Outcome: Completed/Met   Problem: Health Behavior/Discharge Planning: Goal: Ability to safely manage health-related needs after discharge will improve Outcome: Completed/Met   Problem: Education: Goal: Knowledge of General Education information will improve Outcome: Completed/Met   Problem: Health Behavior/Discharge Planning: Goal: Ability to manage health-related needs will improve Outcome: Completed/Met   Problem: Clinical Measurements: Goal: Ability to maintain clinical measurements within normal limits will improve Outcome: Completed/Met Goal: Will remain free from infection Outcome: Completed/Met Goal: Diagnostic test results will improve Outcome: Completed/Met Goal: Respiratory complications will improve Outcome: Completed/Met Goal: Cardiovascular complication will be avoided Outcome: Completed/Met   Problem: Activity: Goal: Risk for activity intolerance will decrease Outcome: Completed/Met   Problem: Nutrition: Goal: Adequate nutrition will be maintained Outcome: Completed/Met   Problem: Coping: Goal: Level of anxiety will decrease Outcome: Completed/Met   Problem: Elimination: Goal: Will not experience complications related to bowel motility Outcome: Completed/Met Goal: Will not experience complications related to urinary retention Outcome: Completed/Met   Problem: Safety: Goal: Ability to remain free from injury will improve Outcome: Completed/Met   Problem: Skin Integrity: Goal: Risk for impaired skin integrity will  decrease Outcome: Completed/Met

## 2017-11-18 NOTE — Discharge Summary (Addendum)
Discharge Summary    Patient ID: [name],  MRN: [id number], DOB/AGE: 12-03-1930 84 y.o.  Admit date: 11/10/2017 Discharge date: 11/18/2017  Primary Care Provider: [name] R Primary Cardiologist: Thurmon Fair, MD   Discharge Diagnoses    Principal Problem:   ST elevation myocardial infarction (STEMI) [facility]) Active Problems:   Type 2 diabetes mellitus with complication, without long-term current use of insulin (HCC)   HTN (hypertension), benign   Acute ST elevation myocardial infarction (STEMI) of inferolateral wall (HCC)   Stage III chronic kidney disease (HCC)   Acute on chronic combined systolic and diastolic CHF (congestive heart failure) (HCC)   Ileus, unspecified (HCC)   Sleep apnea   Obesity   Bilious vomiting   Allergies No Known Allergies  Diagnostic Studies/Procedures    11/10/17 cath  Mid RCA lesion is 95% stenosed.  Dist RCA lesion is 80% stenosed.  Prox Cx to Mid Cx lesion is 90% stenosed.  Ost 2nd Mrg to 2nd Mrg lesion is 100% stenosed.  2nd Mrg lesion is 95% stenosed.  1st Diag lesion is 95% stenosed.  Mid LAD lesion is 80% stenosed.  Dist LAD lesion is 90% stenosed.  Post intervention, there is a 0% residual stenosis.  Post intervention, there is a 0% residual stenosis.  Post intervention, there is a 0% residual stenosis.  A stent was successfully placed.  A stent was successfully placed.  A stent was successfully placed. Acute inferolateral ST segment elevation myocardial infarction secondary to total occlusion of the circumflex vessel. Multivessel CAD with 30% proximal and 95% distal diagonal 1 stenoses of the LAD, 80% mid distal and 90% distal LAD stenoses, and large dominant RCA with 95% mid stenosis and 80% distal stenosis prior to the PDA takeoff.  Successful complex percutaneous coronary intervention involving the proximal circumflex, proximal large OM vessel and distal inferior branch of this vessel after a  large additional branch which cannot be visualized on the diagram above. A 2.0 x 12 mm Resolute Onyx DES stent was placed in the distal inferior secondary branch of the marginal vessel and reduced to 0%; the site of 100% occlusion was treated with PTCA/DES stenting with a 2.5 x 18 mm Resolute DES stent positioned just proximal to the more distal bifurcation into a large branch and distal inferior branch with the 100% occlusion being reduced to 0% with post dilatation up to 3.0 mm; and the proximal 90% stenosis on a sharp bend of the vessel being reduced to 0% with ultimate insertion of a 3.0 x 18 mm Resolute stent postdilated to 3.34 mm. LVEDP 24 mm Hg.  11/12/2017  1st Diag lesion is 95% stenosed.  Mid LAD lesion is 70% stenosed.  Dist LAD lesion is 85% stenosed.  Previously placed 2nd Mrg stent (unknown type) is widely patent.  Previously placed Ost 2nd Mrg to 2nd Mrg stent (unknown type) is widely patent.  Previously placed Prox Cx to Mid Cx stent (unknown type) is widely patent.  Dist RCA lesion is 80% stenosed.  Post intervention, there is a 0% residual stenosis.  Prox RCA to Mid RCA lesion is 30% stenosed.  Mid RCA lesion is 95% stenosed.  A stent was successfully placed.  Post intervention, there is a 0% residual stenosis.  Post intervention, there is a 0% residual stenosis.  A stent was successfully placed. Re-look of the left coronary circulation revealed widely patent stents extending from the proximal circumflex into the marginal vessel and also into the distal branch  without restenosis. The LAD had previously demonstrated 95% distal stenosis in a small caliber diagonal vessel, and the mid distal to distal LAD lesions appeared slightly improved now 70 and 85%. Successful PCI to the RCA with DES stenting of the distal 80% stenosis reduced to 0% with a 2.25 x 12 mm Resolute Onyx stent postdilated to 2.4 mm, and the mid RCA stenosis of 95% with proximal eccentric  narrowing stented with a 3.0 x 26 mm Resolute stent postdilated to 3.36 tapering to 3.25 with the entire stenosis being reduced to 0%. RECOMMENDATION: DAPT for minimum of 1 year. The patient will be started on carvedilol in addition to amlodipine in light of his hypertension as well as concomitant CAD. He is on high potency statin therapy.  Diagnostic Diagram       Post-Intervention Diagram         ECHO: 11/11/2017 - Left ventricle: The cavity size was normal. There was moderate concentric hypertrophy. Systolic function was mildly to moderately reduced. The estimated ejection fraction was in the range of 40% to 45%. Doppler parameters are consistent with abnormal left ventricular relaxation (grade 1 diastolic dysfunction). There was no evidence of elevated ventricular filling pressure by Doppler parameters. - Aortic valve: Valve area (VTI): 2.08 cm^2. Valve area (Vmax): 2.13 cm^2. Valve area (Vmean): 1.98 cm^2. - Aortic root: The aortic root was normal in size. - Mitral valve: Valve area by pressure half-time: 1.28 cm^2. - Left atrium: The atrium was normal in size. - Right ventricle: The cavity size was normal. Wall thickness was normal. Systolic function was normal. - Right atrium: The atrium was normal in size. - Tricuspid valve: There was no regurgitation. - Pulmonary arteries: Systolic pressure could not be accurately estimated. - Inferior vena cava: The vessel was normal in size. - Pericardium, extracardiac: There was no pericardial effusion. Impressions: - There is akinesis of the basal and mid inferior, inefrolateral and apical inferior leads.  _____________   History of Present Illness     84 y.o.malewith ahistory of hypertension, type 2 diabetes mellitus, hyperlipidemia, diastolic dysfunction, prior history of syncope, orthostatic hypotension, CKD stage III secondary to diabetes and hypertension who presented with acute inferolateral  MI on 11/10/2017 due to total occlusion of the circumflex in the setting of multivessel CAD.    Hospital Course     Consultants:  CCS, IM  1.  STEMI: He was initially taken directly to the Cath Lab. Complex interventional circumflex was completed with reperfusion. Residual significant RCA disease was to be treated with staged PCI (DES to mid and distal RCA)and distal LAD disease (medical therapy recommended).  He was started on high-dose statin, aspirin. The next day he was started on low-dose beta-blocker.  He was continued on IV nitroglycerin until after the staged PCI of his RCA.   He was taken back to the Cath Lab on 11/12/2017.  Results are above, he had successful DES x2 to the RCA.  He initially tolerated the procedure well.  2.  Ileus:  06/12, he developed nausea, vomiting, and abdominal distension concerning for ileus. IM and surgery consulted, NG tubewas placed for gastric decompression. NG tube was removed 06/13, pt has been advanced to a heart healthy diet. His stools gradually improved, on the day of discharge, his stool was firmer.  His white count increased to 11.5, but normalized prior to discharge.  3.  Diabetes: His diabetes was managed with sliding scale insulin.  He had been on metformin prior to admission, this was held.  Because of the increase in his creatinine, we will not resume metformin at discharge.  His labs can be rechecked as an outpatient and then a decision can be made on outpatient diabetes management.  Continue ADA carb modified diet.  4.  CKD 3: On admission, BUN was 17 creatinine 1.31.  This increased after the 2 die studies and diuresis, at discharge he was 25/1.69.  He had been on losartan, this was discontinued.  He is currently not on a diuretic. He needs early recheck after discharge, will ask the facility to draw one on Thursday.  5.  Acute on chronic combined systolic and diastolic CHF: He developed some volume overload and required periodic doses of IV  Lasix.  His EF was 40-45% with grade 1 diastolic dysfunction by echocardiogram.  He was admitted at 237 pounds, and peaked at 243.  He was 227 pounds at discharge.  Because of the increase in his creatinine, he will not be on diuretics at discharge.  He will need daily weights and a low-sodium diabetic diet.  6.  Dyslipidemia, LDL goal less than 70: Lipid profile was performed, results are below.  His HDL is 36 and LDL 38.  He had been on Pravachol 40 mg daily prior to admission.  Liver functions were checked and were within normal limits.  He will be discharged on high-dose statin.  7.  Deconditioning: He is legally blind and uses a walker.  He was seen by physical therapy and it was felt that he needed skilled PT to maximize independence and safety.  It was felt that he needed 24-hour supervision/assistance in skilled nursing facility placement was recommended.  8. OSA on CPAP - Pt had CPAP ordered during his hospital stay. He was refusing it at times, but agrees to use it at discharge. It will be ordered.  Plan: On 11/18/2017, he was seen by Dr. Royann Shivers and all data were reviewed.  He was doing well from a cardiac standpoint and considered stable for discharge, with early outpatient follow-up arranged.  _____________  Discharge Vitals Blood pressure 122/67, pulse 93, temperature 99.2 F (37.3 C), temperature source Oral, resp. rate (!) 22, height 5\' 10"  (1.778 m), weight 227 lb 1.6 oz (103 kg), SpO2 98 %.  Filed Weights   11/16/17 0534 11/17/17 0444 11/18/17 0549  Weight: 232 lb 4.8 oz (105.4 kg) 227 lb 9.6 oz (103.2 kg) 227 lb 1.6 oz (103 kg)    Labs & Radiologic Studies    CBC Recent Labs    11/17/17 0410 11/18/17 0413  WBC 11.5* 6.4  HGB 12.0* 12.1*  HCT 37.6* 38.4*  MCV 90.2 88.9  PLT 226 214   Basic Metabolic Panel Recent Labs    16/10/96 1637 11/17/17 0410 11/18/17 0413  NA  --  138 137  K  --  3.3* 4.3  CL  --  105 107  CO2  --  25 21*  GLUCOSE  --  100* 147*    BUN  --  20 25*  CREATININE  --  1.58* 1.69*  CALCIUM  --  8.9 8.9  MG 1.9  --   --    Liver Function Tests Lab Results  Component Value Date   ALT 22 11/14/2017   AST 35 11/14/2017   ALKPHOS 50 11/14/2017   BILITOT 0.9 11/14/2017    Cardiac Enzymes Troponin I  Date Value Ref Range Status  11/12/2017 23.16 (HH) <0.03 ng/mL Final    Comment:    CRITICAL VALUE NOTED.  VALUE IS CONSISTENT WITH PREVIOUSLY REPORTED AND CALLED VALUE. Performed at [facility], [address], [place], [place] [address]   11/11/2017 >65.00 (HH) <0.03 ng/mL Final    Comment:    CRITICAL VALUE NOTED.  VALUE IS CONSISTENT WITH PREVIOUSLY REPORTED AND CALLED VALUE. Performed at [facility], [address], [place], [place] [address]   11/10/2017 51.42 (HH) <0.03 ng/mL Final    Comment:    CRITICAL RESULT CALLED TO, READ BACK BY AND VERIFIED WITH: RN Gabriel Earing AT 1639 [id number] MARTINB Performed at [facility], [address] [place], [place] [address]    Fasting Lipid Panel Cholesterol  Date Value Ref Range Status  11/11/2017 138 0 - 200 mg/dL Final   HDL  Date Value Ref Range Status  11/11/2017 36 (L) >40 mg/dL Final   LDL Cholesterol  Date Value Ref Range Status  11/11/2017 88 0 - 99 mg/dL Final    Comment:           Total Cholesterol/HDL:CHD Risk Coronary Heart Disease Risk Table                     Men   Women  1/2 Average Risk   3.4   3.3  Average Risk       5.0   4.4  2 X Average Risk   9.6   7.1  3 X Average Risk  23.4   11.0        Use the calculated Patient Ratio above and the CHD Risk Table to determine the patient's CHD Risk.        ATP III CLASSIFICATION (LDL):  <100     mg/dL   Optimal  621-308  mg/dL   Near or Above                    Optimal  130-159  mg/dL   Borderline  657-846  mg/dL   High  >962     mg/dL   Very High Performed at [facility], [address] [place], [place] [address]    Triglycerides  Date Value Ref Range  Status  11/11/2017 72 <150 mg/dL Final   _____________  Dg Abd 1 View  Result Date: 11/13/2017 CLINICAL DATA:  Abdomen pains. EXAM: ABDOMEN - 1 VIEW COMPARISON:  None. FINDINGS: The entire abdomen could not be visualized on a single AP radiograph, due to body habitus, therefore high and low exposures were performed. Despite this, portions of the RIGHT abdomen are not visualized in their entirety. Gaseous distended stomach. Prominent LEFT mid abdominal small bowel loop could represent ileus or partial obstruction. Colonic gas is present all the way to the rectosigmoid region. A spinal cord stimulator overlies the lower thoracic region with the battery pack posteriorly on the RIGHT. Lumbar spondylosis. IMPRESSION: AP supine radiographs. Gaseous gastric distention. Single prominent LEFT mid abdominal small bowel loop, uncertain significance. Electronically Signed   By: Elsie Stain M.D.   On: 11/13/2017 13:40   Dg Chest Port 1 View  Result Date: 11/10/2017 CLINICAL DATA:  Chest pain EXAM: PORTABLE CHEST 1 VIEW COMPARISON:  09/07/2014 chest radiograph. FINDINGS: Stimulator leads overlie the lower thoracic spine. Stable cardiomediastinal silhouette with top-normal heart size. No pneumothorax. No pleural effusion. Hypo inspiratory radiograph. No pulmonary edema. No acute consolidative airspace disease. IMPRESSION: Hypo inspiratory radiograph with no active cardiopulmonary disease. Electronically Signed   By: Delbert Phenix M.D.   On: 11/10/2017  11:17   Dg Abd Portable 1v-small Bowel Obstruction Protocol-initial, 8 Hr Delay  Result Date: 11/14/2017 CLINICAL DATA:  Small bowel obstruction.  Delayed film. EXAM: PORTABLE ABDOMEN - 1 VIEW COMPARISON:  Radiograph yesterday 2219 hour FINDINGS: Administered enteric contrast is visualized throughout the ascending, transverse, and descending and rectosigmoid colon. No small bowel dilatation. Enteric tube tip and side-port below the diaphragm in the stomach. Some  residual high-density contrast remains in the gastric lumen. IMPRESSION: Administered enteric contrast seen throughout the entire colon. Electronically Signed   By: Rubye Oaks M.D.   On: 11/14/2017 04:32   Dg Abd Portable 1v  Result Date: 11/13/2017 CLINICAL DATA:  Nasogastric tube placement EXAM: PORTABLE ABDOMEN - 1 VIEW COMPARISON:  11/13/2017 FINDINGS: Nasogastric tube side port is within the stomach. The tip is near the gastroduodenal junction. The examination is otherwise unchanged. IMPRESSION: NG tube tip at the gastroduodenal junction and side port within the stomach. Electronically Signed   By: Deatra Robinson M.D.   On: 11/13/2017 22:33   Dg Abd Portable 1v-small Bowel Protocol-position Verification  Result Date: 11/13/2017 CLINICAL DATA:  Evaluate placement of NG tube. EXAM: PORTABLE ABDOMEN - 1 VIEW COMPARISON:  11/13/2017 FINDINGS: The nasogastric tube tip projects over the body of the stomach. There is contrast opacification of the stomach. Assuming the contrast was administered via the NG tube at the time this radiograph was obtained this confirms intraluminal placement. IMPRESSION: 1. Tip of NG tube projects over the stomach. 2. Contrast opacification of the gastric lumen which, assuming was administered via the NG tube at the time this radiograph was obtained, confirms intraluminal placement Electronically Signed   By: Signa Kell M.D.   On: 11/13/2017 20:34   Disposition   Pt is being discharged home today in good condition.  Follow-up Plans & Appointments     Contact information for follow-up providers    Abelino Derrick, PA-C Follow up on 11/28/2017.   Specialties:  Cardiology, Radiology Why:  Please arrive at 815 am for an 830 am appointment Contact information: [address] [place] [address] [phone]            Contact information for after-discharge care    Destination    [facility] .   Service:  Skilled  Nursing Contact information: [address] [place] [address] [phone]                 Discharge Instructions    (HEART FAILURE PATIENTS) Call MD:  Anytime you have any of the following symptoms: 1) 3 pound weight gain in 24 hours or 5 pounds in 1 week 2) shortness of breath, with or without a dry hacking cough 3) swelling in the hands, feet or stomach 4) if you have to sleep on extra pillows at night in order to breathe.   Complete by:  As directed    Amb Referral to Cardiac Rehabilitation   Complete by:  As directed    Diagnosis:   Coronary Stents STEMI     Diet - low sodium heart healthy   Complete by:  As directed    2 gram sodium   Diet Carb Modified   Complete by:  As directed    Increase activity slowly   Complete by:  As directed       Discharge Medications   Allergies as of 11/18/2017   No Known Allergies     Medication List    STOP taking  these medications   allopurinol 300 MG tablet Commonly known as:  ZYLOPRIM   furosemide 20 MG tablet Commonly known as:  LASIX   losartan 50 MG tablet Commonly known as:  COZAAR   metFORMIN 1000 MG tablet Commonly known as:  GLUCOPHAGE   pravastatin 40 MG tablet Commonly known as:  PRAVACHOL     TAKE these medications   aspirin 81 MG EC tablet Take 1 tablet (81 mg total) by mouth daily. Start taking on:  11/19/2017   atorvastatin 80 MG tablet Commonly known as:  LIPITOR Take 1 tablet (80 mg total) by mouth daily.   loperamide 2 MG capsule Commonly known as:  IMODIUM Take 2 mg by mouth as needed for diarrhea or loose stools.   metoprolol succinate 25 MG 24 hr tablet Commonly known as:  TOPROL-XL Take 0.5 tablets (12.5 mg total) by mouth daily. Start taking on:  11/19/2017   nitroGLYCERIN 0.4 MG SL tablet Commonly known as:  NITROSTAT Place 1 tablet (0.4 mg total) under the tongue every 5 (five) minutes x 3 doses as needed for chest pain.   prednisoLONE acetate 1 %  ophthalmic suspension Commonly known as:  PRED FORTE Place 1 drop into the right eye at bedtime.   sertraline 50 MG tablet Commonly known as:  ZOLOFT Take 50 mg by mouth at bedtime.   tamsulosin 0.4 MG Caps capsule Commonly known as:  FLOMAX Take 0.4 mg by mouth daily.   ticagrelor 90 MG Tabs tablet Commonly known as:  BRILINTA Take 1 tablet (90 mg total) by mouth 2 (two) times daily.   timolol 0.5 % ophthalmic solution Commonly known as:  TIMOPTIC Place 1 drop into the right eye daily.   TRAVATAN Z 0.004 % Soln ophthalmic solution Generic drug:  Travoprost (BAK Free) Place 1 drop into the left eye at bedtime.        Aspirin prescribed at discharge?  Yes High Intensity Statin Prescribed? (Lipitor 40-80mg  or Crestor 20-40mg ): Yes Beta Blocker Prescribed? Yes For EF <40%, was ACEI/ARB Prescribed? No:  CKD ADP Receptor Inhibitor Prescribed? (i.e. Plavix etc.-Includes Medically Managed Patients): Yes For EF <40%, Aldosterone Inhibitor Prescribed? No: CKD Was EF assessed during THIS hospitalization? Yes Was Cardiac Rehab II ordered? (Included Medically managed Patients): No: Patient condition is not appropriate for this   Outstanding Labs/Studies   None  Duration of Discharge Encounter   Greater than 30 minutes including physician time.  Bobbye Riggs Dang Mathison NP 11/18/2017, 1:53 PM

## 2017-11-18 NOTE — Discharge Instructions (Signed)
Daily weights. Record and send with him to cardiology appointments.  CPAP qhs.   BMET on 11/21/2017.  Does not have to be fasting.  Okay to take a.m. Meds.

## 2017-11-18 NOTE — Clinical Social Work Placement (Signed)
   CLINICAL SOCIAL WORK PLACEMENT  NOTE  Date:  11/18/2017  Patient Details  Name: [name] MRN: [id number] Date of Birth: [birth year]  Clinical Social Work is seeking post-discharge placement for this patient at the Skilled  Nursing Facility level of care (*CSW will initial, date and re-position this form in  chart as items are completed):  Yes   Patient/family provided with [place] [facility] Clinical Social Work Department's list of facilities offering this level of care within the geographic area requested by the patient (or if unable, by the patient's family).  Yes   Patient/family informed of their freedom to choose among providers that offer the needed level of care, that participate in Medicare, Medicaid or managed care program needed by the patient, have an available bed and are willing to accept the patient.  Yes   Patient/family informed of [facility]'s ownership interest in [facility] and [facility], as well as of the fact that they are under no obligation to receive care at these facilities.  PASRR submitted to EDS on       PASRR number received on       Existing PASRR number confirmed on 11/14/17     FL2 transmitted to all facilities in geographic area requested by pt/family on 11/14/17     FL2 transmitted to all facilities within larger geographic area on       Patient informed that his/her managed care company has contracts with or will negotiate with certain facilities, including the following:  [facility]     Yes   Patient/family informed of bed offers received.  Patient chooses bed at [facility]     Physician recommends and patient chooses bed at      Patient to be transferred to [facility] on 11/18/17.  Patient to be transferred to facility by [facility]     Patient family notified on 11/18/17 of transfer.  Name of family member notified:  [name], daughter     PHYSICIAN Please prepare  priority discharge summary, including medications, Please prepare prescriptions, Please sign FL2     Additional Comment:    _______________________________________________ Abigail Butts, LCSW 11/18/2017, 9:59 AM

## 2017-11-18 NOTE — Telephone Encounter (Signed)
TOC Patient- Please call Patient- Pt has an appointment with [facility] 11-28-17.

## 2017-11-18 NOTE — Telephone Encounter (Signed)
Patient discharged today 11/18/17 First TOC outreach should be 11/19/17

## 2017-11-19 ENCOUNTER — Non-Acute Institutional Stay (SKILLED_NURSING_FACILITY): Payer: Medicare HMO | Admitting: Internal Medicine

## 2017-11-19 ENCOUNTER — Encounter: Payer: Self-pay | Admitting: Internal Medicine

## 2017-11-19 DIAGNOSIS — I2119 ST elevation (STEMI) myocardial infarction involving other coronary artery of inferior wall: Secondary | ICD-10-CM

## 2017-11-19 DIAGNOSIS — R29818 Other symptoms and signs involving the nervous system: Secondary | ICD-10-CM

## 2017-11-19 DIAGNOSIS — I5043 Acute on chronic combined systolic (congestive) and diastolic (congestive) heart failure: Secondary | ICD-10-CM

## 2017-11-19 DIAGNOSIS — G4733 Obstructive sleep apnea (adult) (pediatric): Secondary | ICD-10-CM

## 2017-11-19 DIAGNOSIS — E118 Type 2 diabetes mellitus with unspecified complications: Secondary | ICD-10-CM

## 2017-11-19 DIAGNOSIS — R4189 Other symptoms and signs involving cognitive functions and awareness: Secondary | ICD-10-CM

## 2017-11-19 NOTE — Assessment & Plan Note (Addendum)
Reassessment by PCP after discharge from SNF

## 2017-11-19 NOTE — Assessment & Plan Note (Signed)
Cardiology follow-up as per Dr Royann Shivers

## 2017-11-19 NOTE — Telephone Encounter (Signed)
Lm for pt to call back ./cy 

## 2017-11-19 NOTE — Assessment & Plan Note (Addendum)
11/19/17 patient denies use of CPAP at home. Son states patient refused CPAP previously. Pathophysiology of sleep apnea and its relationship to heart failure discussed with patient and his son Referral back to [facility] to assess sleep apnea risk

## 2017-11-19 NOTE — Assessment & Plan Note (Addendum)
09/07/14 A1c 6.3% Update A1c

## 2017-11-19 NOTE — Progress Notes (Signed)
NURSING HOME LOCATION:  [facility] ROOM NUMBER:  [id number]  CODE STATUS:  Full Code  PCP:  Clayborn Heron, MD  [address] [place] [address]  This is a comprehensive admission note to [facility] performed on this date less than 30 days from date of admission. Included are preadmission medical/surgical history; reconciled medication list; family history; social history and comprehensive review of systems.  Corrections and additions to the records were documented. Comprehensive physical exam was also performed. Additionally a clinical summary was entered for each active diagnosis pertinent to this admission in the Problem List to enhance continuity of care.  HPI:  Patient was hospitalized 6/9-6/17/19 with ST elevation myocardial infarction secondary to total occlusion of the circumflex artery in the setting of multivessel CAD. The acute event was in the context of hypertension, type 2 diabetes, dyslipidemia, diastolic dysfunction, and CKD stage III. Catheterization was performed emergently and revealed diffuse, multivessel, severe stenoses ranging from 80% up to 100%. Stenting resulted in dramatic improvement. Cath was repeated 6/11 with successful PCI to the RCA with DES stenting of the distal 80% stenosis resulting in resolution. Dual antiplatelet therapy was to be continued for a minimum of 1 year. Carvedilol was initiated in addition to amlodipine to treat hypertension as well as concomitant CAD, specifically distal LAD disease. Course was complicated by ileus 6/12, NG tube was placed for gastric decompression.NG tube was removed 6/13 and diet advanced. Diabetes was treated with sliding scale insulin. Metformin was held because of increasing creatinine. Metformin was not renewed at discharge as he has CKD III After the 2 dye studies and diuresis, creatinine was 1.69 and BUN 25 at discharge. Losartan, ARB was discontinued. He also developed volume overload and required  periodic doses of IV Lasix. Ejection fraction was 30-45 percent with grade 1 diastolic dysfunction on echocardiogram. Admission weight was 237 pounds but peaked at 243 pounds. Weight at discharge was 227 pounds. Diuretics were held at discharge due to the increasing creatinine. Low sodium diet and daily weights were to be continued. LDL was at goal at 38 on Pravachol 40 mg daily. He was changed to atorvastatin 80 mg daily. The patient is legally blind and ambulates with a walker. He  was found to be markedly deconditioned as per PT/OT. SNF placement for rehabilitation was recommended. According to the discharge summary he has obstructive sleep apnea and is on CPAP.He did refuse his CPAP intermittently but apparently agreed to continue it at discharge.  At discharge he exhibited very mild normochromic, normocytic anemia with hemoglobin 12.1/hematocrit 38.4.  Past medical and surgical history: In addition to the extensive history above, he has history of syncope possibly related to postural hypotension & a history of prior stroke. The legal blindness is in the context of macular degeneration. Other diagnoses include history of gastric ulcer, history of gout, and diabetic nephropathy. He has an extensive history of multiple orthopedic seizures and surgeries  Social history: quit drinking in the 1980s. Former 2 pack per day smoker, he quit in the 1980s.   Family history: No history recorded in Epic. His son was able to provide additional information. Another son had an MI, sister diabetes, there is no family history of cancer.   Review of systems: He describes insomnia which he relates to shortness of breath. He denies having a CPAP machine at home and does not remember using it in the hospital. On the mental status testing score was 16 out of a possible 24 suggesting  mild neurocognitive deficits. He describes exertional dyspnea after 10 yards w/o associated anginal symptoms. He does describe some  discomfort in the right axilla.  The DOE was validated by PT/OT. O2 sats were 95% sitting on the bed but dropped to 88% with that short walk. Also immediately after returning to bed it rose to 93% on room air.  He states he usually uses a walker for ambulation.  His son states he does have apnea & was seen by sleep medicine in 2017. He states his father refused a CPAP at that time.  Constitutional: No fever, significant weight change, fatigue  Eyes: No redness, discharge, pain, vision change ENT/mouth: No nasal congestion, purulent discharge, earache, change in hearing, sore throat  Cardiovascular: No chest pain, palpitations,claudication, edema  Respiratory: No cough, sputum production, hemoptysis, Gastrointestinal: No heartburn, dysphagia, abdominal pain, nausea /vomiting, rectal bleeding, melena, change in bowels Genitourinary: No dysuria, hematuria, pyuria, incontinence, nocturia Musculoskeletal: No joint stiffness, joint swelling, weakness, pain Dermatologic: No rash, pruritus, change in appearance of skin Neurologic: No dizziness, headache, syncope, seizures, numbness, tingling Psychiatric: No significant anxiety, depression, insomnia, anorexia Endocrine: No change in hair/skin/nails, excessive thirst, excessive hunger, excessive urination  Hematologic/lymphatic: No significant bruising, lymphadenopathy, abnormal bleeding Allergy/immunology: No itchy/watery eyes, significant sneezing, urticaria, angioedema  Physical exam:  Pertinent or positive findings:Patient is legally blind; he cannot count  fingers at 3 feet. Eyebrows absent laterally. Ptosis bilaterally He has pattern alopecia and  beard and mustache. Faint rales right lower lobe. Heart rate is irregular and slow. He has trace edema at the sock line. Pedal pulses are decreased. He symmetrically weak in all extremities. No mass or adenopathy present in the right axilla. There was no tenderness to palpation in this area.  General  appearance: Adequately nourished; no acute distress, increased work of breathing is present.   Lymphatic: No lymphadenopathy about the head, neck, axilla. Eyes: No conjunctival inflammation or lid edema is present. There is no scleral icterus. Ears:  External ear exam shows no significant lesions or deformities.   Nose:  External nasal examination shows no deformity or inflammation. Nasal mucosa are pink and moist without lesions, exudates Oral exam: Lips and gums are healthy appearing.There is no oropharyngeal erythema or exudate. Neck:  No thyromegaly, masses, tenderness noted.    Heart:  without gallop, murmur, click, rub.  Lungs: without wheezes, rhonchi, rubs. Abdomen: Bowel sounds are normal.  Abdomen is soft and nontender with no organomegaly, hernias, masses. GU: Deferred  Extremities:  No cyanosis, clubbing, edema. Neurologic exam: Balance, Rhomberg, finger to nose testing could not be completed due to clinical state Skin: Warm & dry w/o tenting. No significant lesions or rash.  See clinical summary under each active problem in the Problem List with associated updated therapeutic plan

## 2017-11-19 NOTE — Assessment & Plan Note (Addendum)
Daily weights and sodium restriction; this was discussed with him and his son Initiate low dose furosemide with adjustment of dose as dictated by weights

## 2017-11-20 NOTE — Telephone Encounter (Signed)
Left message to call back. 2nd attempt.

## 2017-11-21 ENCOUNTER — Telehealth (HOSPITAL_COMMUNITY): Payer: Self-pay

## 2017-11-21 NOTE — Patient Instructions (Signed)
See assessment and plan under each diagnosis in the problem list and acutely for this visit 

## 2017-11-21 NOTE — Telephone Encounter (Signed)
Patient is currently attempted to [facility]

## 2017-11-21 NOTE — Telephone Encounter (Signed)
Patients insurance is active and benefits verified through [facility] - $10.00 co-pay, no deductible, out of pocket amount of $3,400/$817.47 has been met, no co-insurance, and no pre-authorization is required. Passport/reference [phone]  Will contact patient to see if she is interested in the Cardiac Rehab Program. If interested, patient will need to complete follow up appt. Once completed, patient will be contacted for scheduling upon review by the RN Navigator.

## 2017-11-28 ENCOUNTER — Telehealth (HOSPITAL_COMMUNITY): Payer: Self-pay

## 2017-11-28 ENCOUNTER — Ambulatory Visit: Payer: Medicare HMO | Admitting: Cardiology

## 2017-11-28 DIAGNOSIS — I255 Ischemic cardiomyopathy: Secondary | ICD-10-CM | POA: Insufficient documentation

## 2017-11-28 NOTE — Telephone Encounter (Signed)
Attempted to call patient to see if he is interested in the Cardiac Rehab Program - lm on vm °

## 2017-11-29 ENCOUNTER — Encounter: Payer: Self-pay | Admitting: Adult Health

## 2017-11-29 ENCOUNTER — Encounter: Payer: Self-pay | Admitting: *Deleted

## 2017-11-29 ENCOUNTER — Non-Acute Institutional Stay (SKILLED_NURSING_FACILITY): Payer: Medicare HMO | Admitting: Adult Health

## 2017-11-29 DIAGNOSIS — N183 Chronic kidney disease, stage 3 unspecified: Secondary | ICD-10-CM

## 2017-11-29 DIAGNOSIS — G4733 Obstructive sleep apnea (adult) (pediatric): Secondary | ICD-10-CM

## 2017-11-29 DIAGNOSIS — F329 Major depressive disorder, single episode, unspecified: Secondary | ICD-10-CM

## 2017-11-29 DIAGNOSIS — F32A Depression, unspecified: Secondary | ICD-10-CM

## 2017-11-29 DIAGNOSIS — I10 Essential (primary) hypertension: Secondary | ICD-10-CM

## 2017-11-29 DIAGNOSIS — I213 ST elevation (STEMI) myocardial infarction of unspecified site: Secondary | ICD-10-CM | POA: Diagnosis not present

## 2017-11-29 DIAGNOSIS — E118 Type 2 diabetes mellitus with unspecified complications: Secondary | ICD-10-CM | POA: Diagnosis not present

## 2017-11-29 DIAGNOSIS — E782 Mixed hyperlipidemia: Secondary | ICD-10-CM | POA: Diagnosis not present

## 2017-11-29 DIAGNOSIS — I5043 Acute on chronic combined systolic (congestive) and diastolic (congestive) heart failure: Secondary | ICD-10-CM | POA: Diagnosis not present

## 2017-11-29 DIAGNOSIS — N400 Enlarged prostate without lower urinary tract symptoms: Secondary | ICD-10-CM

## 2017-11-29 MED ORDER — TAMSULOSIN HCL 0.4 MG PO CAPS: 0.4000 mg | ORAL_CAPSULE | Freq: Every day | ORAL | 0 refills | Status: DC

## 2017-11-29 MED ORDER — ATORVASTATIN CALCIUM 80 MG PO TABS: 80.0000 mg | ORAL_TABLET | Freq: Every day | ORAL | 11 refills | Status: DC

## 2017-11-29 MED ORDER — TIMOLOL MALEATE 0.5 % OP SOLN: 1.0000 [drp] | Freq: Every day | OPHTHALMIC | 0 refills | Status: DC

## 2017-11-29 MED ORDER — SERTRALINE HCL 50 MG PO TABS: 50.0000 mg | ORAL_TABLET | Freq: Every day | ORAL | 0 refills | Status: AC

## 2017-11-29 MED ORDER — FUROSEMIDE 20 MG PO TABS: 20.0000 mg | ORAL_TABLET | Freq: Every day | ORAL | 0 refills | Status: DC

## 2017-11-29 MED ORDER — NITROGLYCERIN 0.4 MG SL SUBL: 0.4000 mg | SUBLINGUAL_TABLET | SUBLINGUAL | 0 refills | Status: DC | PRN

## 2017-11-29 MED ORDER — PREDNISOLONE ACETATE 1 % OP SUSP: 1.0000 [drp] | Freq: Every day | OPHTHALMIC | 0 refills | Status: DC

## 2017-11-29 MED ORDER — METOPROLOL SUCCINATE ER 25 MG PO TB24: 12.5000 mg | ORAL_TABLET | Freq: Every day | ORAL | 0 refills | Status: DC

## 2017-11-29 MED ORDER — TRAVATAN Z 0.004 % OP SOLN: 1.0000 [drp] | Freq: Every day | OPHTHALMIC | 0 refills | Status: DC

## 2017-11-29 MED ORDER — TICAGRELOR 90 MG PO TABS: 90.0000 mg | ORAL_TABLET | Freq: Two times a day (BID) | ORAL | 0 refills | Status: DC

## 2017-11-29 NOTE — Progress Notes (Signed)
Location:  [facility] Room Number: 311-A Place of Service:  SNF (31) Provider:  Kenard Gower, NP  Patient Care Team: Clayborn Heron, MD as PCP - General (Family Medicine) Thurmon Fair, MD as PCP - Cardiology (Cardiology)  Extended Emergency Contact Information Primary Emergency Contact: [name] Mobile Phone: [phone] Relation: Daughter  Code Status:  Full Code  Goals of care: Advanced Directive information Advanced Directives 11/10/2017  Does Patient Have a Medical Advance Directive? Yes  Type of Advance Directive Healthcare Power of Attorney  Does patient want to make changes to medical advance directive? No - Patient declined  Copy of Healthcare Power of Attorney in Chart? No - copy requested  Would patient like information on creating a medical advance directive? -  Pre-existing out of facility DNR order (yellow form or pink MOST form) -     Chief Complaint  Patient presents with  . Discharge Note    The patient is seen for a discharge visit.  Plan is for him to discharge 12/01/17.    HPI:  Pt is an 84 y.o. male seen today for discharge.  He is to discharge home on 12/01/17 with home health PT and OT.  Marland Kitchen  He has been admitted to [facility] on 11/18/17 from a recent hospitalization with STEMI. He was initially taken to cath lab for complex interventional circumflex for referfusion.Marland KitchenResidual significant RCA disease was to be treated with staged PCI (DES to mild and distal RCA) and distal LAD disease. He was started on high dose statin and aspirin. The next day, he was started on low-dose betablocker. He was continued on IV Nitroglycerin until after the staged PCI of his RCA. He was taken back to cath lab on 11/12/17. He had a successful DES X 2 to the RCA.  He has a PMH of syncope related to postural hypotension, stroke, legal blindness due to macular degeneration, gastric ulcer, gout, diabetic nephropathy,  type 2 DM, dyslipidemia, diastolic dysfunction, CKD stage III, and MI.    Patient was admitted to this facility for short-term rehabilitation after the patient's recent hospitalization.  Patient has completed SNF rehabilitation and therapy has cleared the patient for discharge.   Past Medical History:  Diagnosis Date  . Arthritis    "all over" (11/12/2017)  . Chronic lower back pain   . CKD (chronic kidney disease), stage III (HCC)   . Constipation    takes an OTC med as needed  . Diabetic nephropathy (HCC)   . Diastolic dysfunction    a. 01/2015 Echo: EF 60-65%, no rwma, Gr1 DD, mildly dil LA, PaSP .  . Gastric ulcer 1970s  . Glaucoma, both eyes    uses eye drops daily  . Gout    takes Allopurinol daily  . History of colon polyps   . History of stress test    a. 09/2014 MV: EF 53%, no ischemia/infarct.  . Hyperlipidemia    takes Pravastatin daily  . Hypertension    takes Amlodipine and Losartan daily  . Insomnia    uses OTC sleep aide  . Joint pain   . Joint swelling    right knee   . Legally blind   . Macular degeneration   . Morbid obesity (HCC)   . Nocturia   . Peripheral edema   . Pleurisy 20+yrs ago  . Pneumonia    "I think once when I was a kid" (11/12/2017)  . Sleep apnea   . STEMI (ST elevation myocardial  infarction) (HCC) 11/10/2017  . Stroke [facility])    "I've had them; don't know anything about them"; denies residual on 11/12/2017)  . Syncope    a. 09/2014 in setting of orthostasis; b. 10/2014 Event monitor: 19 beat run of asymp NSVT; c. 01/2015 Echo: EF 60-65%, no rwma.  . Type II diabetes mellitus (HCC)    takes Metformin daily  . Urinary frequency    takes Flomax daily   Past Surgical History:  Procedure Laterality Date  . BACK SURGERY    . CATARACT EXTRACTION W/ INTRAOCULAR LENS  IMPLANT, BILATERAL Bilateral   . COLONOSCOPY    . CORONARY ANGIOGRAPHY N/A 11/12/2017   Procedure: CORONARY ANGIOGRAPHY;  Surgeon: Lennette Bihari, MD;  Location: [facility]  INVASIVE CV LAB;  Service: Cardiovascular;  Laterality: N/A;  . CORONARY STENT INTERVENTION N/A 11/12/2017   Procedure: CORONARY STENT INTERVENTION;  Surgeon: Lennette Bihari, MD;  Location: [facility] INVASIVE CV LAB;  Service: Cardiovascular;  Laterality: N/A;  . CORONARY/GRAFT ACUTE MI REVASCULARIZATION N/A 11/10/2017   Procedure: Coronary/Graft Acute MI Revascularization;  Surgeon: Lennette Bihari, MD;  Location: [facility] INVASIVE CV LAB;  Service: Cardiovascular;  Laterality: N/A;  . EYE SURGERY    . GLAUCOMA SURGERY Bilateral   . INSERTION / PLACEMENT / REVISION NEUROSTIMULATOR  2000s  . KNEE ARTHROSCOPY Right 1990s  . LEFT HEART CATH AND CORONARY ANGIOGRAPHY N/A 11/10/2017   Procedure: LEFT HEART CATH AND CORONARY ANGIOGRAPHY;  Surgeon: Lennette Bihari, MD;  Location: [facility] INVASIVE CV LAB;  Service: Cardiovascular;  Laterality: N/A;  . MINI SHUNT INSERTION  09/26/2011   Procedure: INSERTION OF MINI SHUNT;  Surgeon: Chalmers Guest, MD;  Location: [facility] OR;  Service: Ophthalmology;  Laterality: Right;  Insertion of Ahmed shunt  . POSTERIOR FUSION LUMBAR SPINE  1990s  . REVERSE SHOULDER ARTHROPLASTY Right 09/21/2013   Procedure: RIGHT  SHOULDER ARTHROPLASTY REVERSE ;  Surgeon: [name], MD;  Location: [facility] OR;  Service: Orthopedics;  Laterality: Right;  . SHOULDER INJECTION Left 09/21/2013   Procedure: SHOULDER INJECTION;  Surgeon: [name], MD;  Location: [facility] OR;  Service: Orthopedics;  Laterality: Left;    No Known Allergies  Outpatient Encounter Medications as of 11/29/2017  Medication Sig  . aspirin EC 81 MG EC tablet Take 1 tablet (81 mg total) by mouth daily.  Marland Kitchen atorvastatin (LIPITOR) 80 MG tablet Take 1 tablet (80 mg total) by mouth daily.  . furosemide (LASIX) 20 MG tablet Take 20 mg by mouth daily.  Marland Kitchen loperamide (IMODIUM) 2 MG capsule Take 2 mg by mouth as needed for diarrhea or loose stools.  . metoprolol succinate (TOPROL-XL) 25 MG 24 hr tablet Take 0.5 tablets (12.5 mg total) by mouth daily.   . nitroGLYCERIN (NITROSTAT) 0.4 MG SL tablet Place 1 tablet (0.4 mg total) under the tongue every 5 (five) minutes x 3 doses as needed for chest pain.  . prednisoLONE acetate (PRED FORTE) 1 % ophthalmic suspension Place 1 drop into the right eye at bedtime.  . sertraline (ZOLOFT) 50 MG tablet Take 50 mg by mouth at bedtime.   . Tamsulosin HCl (FLOMAX) 0.4 MG CAPS Take 0.4 mg by mouth daily.  . ticagrelor (BRILINTA) 90 MG TABS tablet Take 1 tablet (90 mg total) by mouth 2 (two) times daily.  . timolol (TIMOPTIC) 0.5 % ophthalmic solution Place 1 drop into the right eye daily.   . TRAVATAN Z 0.004 % SOLN ophthalmic solution Place 1 drop into the left eye at bedtime.  No facility-administered encounter medications on file as of 11/29/2017.     Review of Systems  GENERAL: No fever, chills or weakness MOUTH and THROAT: Denies oral discomfort, gingival pain or bleeding, pain from teeth or hoarseness   RESPIRATORY: no cough, SOB, DOE, wheezing, hemoptysis CARDIAC: No chest pain, edema or palpitations GI: No abdominal pain, diarrhea, constipation, heart burn, nausea or vomiting GU: Denies dysuria, frequency, hematuria, incontinence, or discharge PSYCHIATRIC: Denies feelings of depression or anxiety. No report of hallucinations, insomnia, paranoia, or agitation   Immunization History  Administered Date(s) Administered  . Influenza Split 02/04/2014  . Influenza,inj,quad, With Preservative 04/06/2015  . Influenza-Unspecified 02/25/2013  . Pneumococcal Conjugate-13 04/23/2014  . Pneumococcal Polysaccharide-23 05/19/2012  . Pneumococcal-Unspecified 06/08/2014  . Tdap 05/19/2012   Pertinent  Health Maintenance Due  Topic Date Due  . FOOT EXAM  02/24/1941  . OPHTHALMOLOGY EXAM  02/24/1941  . URINE MICROALBUMIN  02/24/1941  . HEMOGLOBIN A1C  03/09/2015  . INFLUENZA VACCINE  01/02/2018  . PNA vac Low Risk Adult  Completed      Vitals:   11/29/17 0909  BP: 140/82  Pulse: 68  Resp: 17    Temp: 98.1 F (36.7 C)  TempSrc: Oral  SpO2: 96%  Weight: 233 lb 3.2 oz (105.8 kg)  Height: 5\' 10"  (1.778 m)   Body mass index is 33.46 kg/m.  Physical Exam  GENERAL APPEARANCE: Well nourished. In no acute distress.Obese SKIN:  Skin is warm and dry.  MOUTH and THROAT: Lips are without lesions. Oral mucosa is moist and without lesions. Tongue is normal in shape, size, and color and without lesions RESPIRATORY: Breathing is even & unlabored, BS CTAB CARDIAC: RRR, no murmur,no extra heart sounds, no edema GI: Abdomen soft, normal BS, no masses, no tenderness EXTREMITIES:  Able to move X 4 extremities PSYCHIATRIC: Alert and oriented X 3. Affect and behavior are appropriate   Labs reviewed: Recent Labs    11/16/17 0312 11/16/17 1637 11/17/17 0410 11/18/17 0413  NA 139  --  138 137  K 4.1  --  3.3* 4.3  CL 105  --  105 107  CO2 26  --  25 21*  GLUCOSE 119*  --  100* 147*  BUN 14  --  20 25*  CREATININE 1.46*  --  1.58* 1.69*  CALCIUM 9.5  --  8.9 8.9  MG  --  1.9  --   --    Recent Labs    11/10/17 1100 11/11/17 0217 11/14/17 0455  AST 16 204* 35  ALT 12* 45 22  ALKPHOS 61 58 50  BILITOT 0.8 0.8 0.9  PROT 6.9 6.8 6.6  ALBUMIN 3.5 3.4* 3.3*   Recent Labs    11/10/17 1100  11/13/17 1604 11/16/17 0312 11/17/17 0410 11/18/17 0413  WBC 4.9   < > 5.9 8.7 11.5* 6.4  NEUTROABS 2.5  --  4.4  --   --   --   HGB 13.8   < > 12.0* 12.2* 12.0* 12.1*  HCT 42.9   < > 37.6* 38.6* 37.6* 38.4*  MCV 88.3   < > 89.1 88.9 90.2 88.9  PLT 197   < > 197 213 226 214   < > = values in this interval not displayed.   Lab Results  Component Value Date   TSH 0.948 01/22/2015   Lab Results  Component Value Date   HGBA1C 6.3 (H) 09/07/2014   Lab Results  Component Value Date   CHOL 138 11/11/2017  HDL 36 (L) 11/11/2017   LDLCALC 88 11/11/2017   TRIG 72 11/11/2017   CHOLHDL 3.8 11/11/2017    Significant Diagnostic Results in last 30 days:  Dg Abd 1 View  Result Date:  11/13/2017 CLINICAL DATA:  Abdomen pains. EXAM: ABDOMEN - 1 VIEW COMPARISON:  None. FINDINGS: The entire abdomen could not be visualized on a single AP radiograph, due to body habitus, therefore high and low exposures were performed. Despite this, portions of the RIGHT abdomen are not visualized in their entirety. Gaseous distended stomach. Prominent LEFT mid abdominal small bowel loop could represent ileus or partial obstruction. Colonic gas is present all the way to the rectosigmoid region. A spinal cord stimulator overlies the lower thoracic region with the battery pack posteriorly on the RIGHT. Lumbar spondylosis. IMPRESSION: AP supine radiographs. Gaseous gastric distention. Single prominent LEFT mid abdominal small bowel loop, uncertain significance. Electronically Signed   By: Elsie Stain M.D.   On: 11/13/2017 13:40   Dg Chest Port 1 View  Result Date: 11/10/2017 CLINICAL DATA:  Chest pain EXAM: PORTABLE CHEST 1 VIEW COMPARISON:  09/07/2014 chest radiograph. FINDINGS: Stimulator leads overlie the lower thoracic spine. Stable cardiomediastinal silhouette with top-normal heart size. No pneumothorax. No pleural effusion. Hypo inspiratory radiograph. No pulmonary edema. No acute consolidative airspace disease. IMPRESSION: Hypo inspiratory radiograph with no active cardiopulmonary disease. Electronically Signed   By: Delbert Phenix M.D.   On: 11/10/2017 11:17   Dg Abd Portable 1v-small Bowel Obstruction Protocol-initial, 8 Hr Delay  Result Date: 11/14/2017 CLINICAL DATA:  Small bowel obstruction.  Delayed film. EXAM: PORTABLE ABDOMEN - 1 VIEW COMPARISON:  Radiograph yesterday 2219 hour FINDINGS: Administered enteric contrast is visualized throughout the ascending, transverse, and descending and rectosigmoid colon. No small bowel dilatation. Enteric tube tip and side-port below the diaphragm in the stomach. Some residual high-density contrast remains in the gastric lumen. IMPRESSION: Administered enteric  contrast seen throughout the entire colon. Electronically Signed   By: Rubye Oaks M.D.   On: 11/14/2017 04:32   Dg Abd Portable 1v  Result Date: 11/13/2017 CLINICAL DATA:  Nasogastric tube placement EXAM: PORTABLE ABDOMEN - 1 VIEW COMPARISON:  11/13/2017 FINDINGS: Nasogastric tube side port is within the stomach. The tip is near the gastroduodenal junction. The examination is otherwise unchanged. IMPRESSION: NG tube tip at the gastroduodenal junction and side port within the stomach. Electronically Signed   By: Deatra Robinson M.D.   On: 11/13/2017 22:33   Dg Abd Portable 1v-small Bowel Protocol-position Verification  Result Date: 11/13/2017 CLINICAL DATA:  Evaluate placement of NG tube. EXAM: PORTABLE ABDOMEN - 1 VIEW COMPARISON:  11/13/2017 FINDINGS: The nasogastric tube tip projects over the body of the stomach. There is contrast opacification of the stomach. Assuming the contrast was administered via the NG tube at the time this radiograph was obtained this confirms intraluminal placement. IMPRESSION: 1. Tip of NG tube projects over the stomach. 2. Contrast opacification of the gastric lumen which, assuming was administered via the NG tube at the time this radiograph was obtained, confirms intraluminal placement Electronically Signed   By: Signa Kell M.D.   On: 11/13/2017 20:34    Assessment/Plan  1. ST elevation myocardial infarction (STEMI), unspecified artery (HCC) - follow-up with cardiology - atorvastatin (LIPITOR) 80 MG tablet; Take 1 tablet (80 mg total) by mouth daily.  Dispense: 30 tablet; Refill: 11 - nitroGLYCERIN (NITROSTAT) 0.4 MG SL tablet; Place 1 tablet (0.4 mg total) under the tongue every 5 (five) minutes x 3 doses  as needed for chest pain.  Dispense: 25 tablet; Refill: 0 - ticagrelor (BRILINTA) 90 MG TABS tablet; Take 1 tablet (90 mg total) by mouth 2 (two) times daily.  Dispense: 60 tablet; Refill: 0  2. Acute on chronic combined systolic and diastolic CHF  (congestive heart failure) (HCC) - furosemide (LASIX) 20 MG tablet; Take 1 tablet (20 mg total) by mouth daily.  Dispense: 30 tablet; Refill: 0 - metoprolol succinate (TOPROL-XL) 25 MG 24 hr tablet; Take 0.5 tablets (12.5 mg total) by mouth daily.  Dispense: 15 tablet; Refill: 0  3. HTN (hypertension), benign - metoprolol succinate (TOPROL-XL) 25 MG 24 hr tablet; Take 0.5 tablets (12.5 mg total) by mouth daily.  Dispense: 15 tablet; Refill: 0  4. Type 2 diabetes mellitus with complication, without long-term current use of insulin (HCC) - Metformin was discontinued due to increase in creatinine, follow up with PCP Lab Results  Component Value Date   HGBA1C 6.3 (H) 09/07/2014     5. Benign prostatic hyperplasia, unspecified whether lower urinary tract symptoms present - tamsulosin (FLOMAX) 0.4 MG CAPS capsule; Take 1 capsule (0.4 mg total) by mouth daily.  Dispense: 30 capsule; Refill: 0  6. Mixed hyperlipidemia Lab Results  Component Value Date   CHOL 138 11/11/2017   HDL 36 (L) 11/11/2017   LDLCALC 88 11/11/2017   TRIG 72 11/11/2017   CHOLHDL 3.8 11/11/2017   - atorvastatin (LIPITOR) 80 MG tablet; Take 1 tablet (80 mg total) by mouth daily.  Dispense: 30 tablet; Refill: 11  7. Chronic depression - mood is stable - sertraline (ZOLOFT) 50 MG tablet; Take 1 tablet (50 mg total) by mouth at bedtime.  Dispense: 30 tablet; Refill: 0   8. Chronic kidney disease, stage III - Metformin was discontinued and will need to follow up with PCP Lab Results  Component Value Date   CREATININE 1.69 (H) 11/18/2017    9.  OSA - had been refusing CPAP and need to follow-up with pulmonology for sleep study    I have filled out patient's discharge paperwork and written prescriptions.  Patient will receive home health PT and OT.  DME provided:  Semi-electric hospital bed and a standard wheelchair.  Total discharge time: Greater than 30 minutes Greater than 50% was spent in counseling and  coordination of care.  Discharge time involved coordination of the discharge process with social worker, nursing staff and therapy department. Medical justification for home health services/DME verified.   Kenard Gower, NP [facility] [phone] (Monday-Friday 8:00 a.m. - 5:00 p.m.) [phone] (after hours)

## 2017-12-08 ENCOUNTER — Encounter (HOSPITAL_COMMUNITY): Payer: Self-pay | Admitting: Emergency Medicine

## 2017-12-08 ENCOUNTER — Other Ambulatory Visit: Payer: Self-pay

## 2017-12-08 ENCOUNTER — Observation Stay (HOSPITAL_COMMUNITY)
Admission: EM | Admit: 2017-12-08 | Discharge: 2017-12-09 | Disposition: A | Discharge status: Home / Self Care | Payer: Medicare HMO | Attending: Interventional Cardiology | Admitting: Interventional Cardiology

## 2017-12-08 ENCOUNTER — Emergency Department (HOSPITAL_COMMUNITY): Payer: Medicare HMO

## 2017-12-08 DIAGNOSIS — I25811 Atherosclerosis of native coronary artery of transplanted heart without angina pectoris: Secondary | ICD-10-CM

## 2017-12-08 DIAGNOSIS — G4700 Insomnia, unspecified: Secondary | ICD-10-CM | POA: Insufficient documentation

## 2017-12-08 DIAGNOSIS — G4730 Sleep apnea, unspecified: Secondary | ICD-10-CM | POA: Insufficient documentation

## 2017-12-08 DIAGNOSIS — H3530 Unspecified macular degeneration: Secondary | ICD-10-CM | POA: Insufficient documentation

## 2017-12-08 DIAGNOSIS — H409 Unspecified glaucoma: Secondary | ICD-10-CM | POA: Diagnosis not present

## 2017-12-08 DIAGNOSIS — I255 Ischemic cardiomyopathy: Secondary | ICD-10-CM | POA: Diagnosis not present

## 2017-12-08 DIAGNOSIS — M109 Gout, unspecified: Secondary | ICD-10-CM | POA: Diagnosis not present

## 2017-12-08 DIAGNOSIS — Z79899 Other long term (current) drug therapy: Secondary | ICD-10-CM | POA: Insufficient documentation

## 2017-12-08 DIAGNOSIS — R079 Chest pain, unspecified: Secondary | ICD-10-CM | POA: Diagnosis present

## 2017-12-08 DIAGNOSIS — E1122 Type 2 diabetes mellitus with diabetic chronic kidney disease: Secondary | ICD-10-CM | POA: Insufficient documentation

## 2017-12-08 DIAGNOSIS — Z9842 Cataract extraction status, left eye: Secondary | ICD-10-CM | POA: Insufficient documentation

## 2017-12-08 DIAGNOSIS — Z6832 Body mass index (BMI) 32.0-32.9, adult: Secondary | ICD-10-CM | POA: Diagnosis not present

## 2017-12-08 DIAGNOSIS — Z8673 Personal history of transient ischemic attack (TIA), and cerebral infarction without residual deficits: Secondary | ICD-10-CM | POA: Diagnosis not present

## 2017-12-08 DIAGNOSIS — Z981 Arthrodesis status: Secondary | ICD-10-CM | POA: Insufficient documentation

## 2017-12-08 DIAGNOSIS — Z9841 Cataract extraction status, right eye: Secondary | ICD-10-CM | POA: Insufficient documentation

## 2017-12-08 DIAGNOSIS — N183 Chronic kidney disease, stage 3 (moderate): Secondary | ICD-10-CM | POA: Insufficient documentation

## 2017-12-08 DIAGNOSIS — H548 Legal blindness, as defined in USA: Secondary | ICD-10-CM | POA: Diagnosis not present

## 2017-12-08 DIAGNOSIS — R0789 Other chest pain: Secondary | ICD-10-CM | POA: Diagnosis not present

## 2017-12-08 DIAGNOSIS — I213 ST elevation (STEMI) myocardial infarction of unspecified site: Secondary | ICD-10-CM

## 2017-12-08 DIAGNOSIS — Z8719 Personal history of other diseases of the digestive system: Secondary | ICD-10-CM | POA: Insufficient documentation

## 2017-12-08 DIAGNOSIS — I5043 Acute on chronic combined systolic (congestive) and diastolic (congestive) heart failure: Secondary | ICD-10-CM

## 2017-12-08 DIAGNOSIS — Z9889 Other specified postprocedural states: Secondary | ICD-10-CM | POA: Insufficient documentation

## 2017-12-08 DIAGNOSIS — M1990 Unspecified osteoarthritis, unspecified site: Secondary | ICD-10-CM | POA: Insufficient documentation

## 2017-12-08 DIAGNOSIS — Z86010 Personal history of colonic polyps: Secondary | ICD-10-CM | POA: Insufficient documentation

## 2017-12-08 DIAGNOSIS — I130 Hypertensive heart and chronic kidney disease with heart failure and stage 1 through stage 4 chronic kidney disease, or unspecified chronic kidney disease: Secondary | ICD-10-CM | POA: Diagnosis not present

## 2017-12-08 DIAGNOSIS — I5042 Chronic combined systolic (congestive) and diastolic (congestive) heart failure: Secondary | ICD-10-CM | POA: Insufficient documentation

## 2017-12-08 DIAGNOSIS — I252 Old myocardial infarction: Secondary | ICD-10-CM | POA: Insufficient documentation

## 2017-12-08 DIAGNOSIS — E782 Mixed hyperlipidemia: Secondary | ICD-10-CM

## 2017-12-08 DIAGNOSIS — E785 Hyperlipidemia, unspecified: Secondary | ICD-10-CM | POA: Diagnosis not present

## 2017-12-08 DIAGNOSIS — Z87891 Personal history of nicotine dependence: Secondary | ICD-10-CM | POA: Insufficient documentation

## 2017-12-08 DIAGNOSIS — I2510 Atherosclerotic heart disease of native coronary artery without angina pectoris: Secondary | ICD-10-CM | POA: Insufficient documentation

## 2017-12-08 DIAGNOSIS — Z794 Long term (current) use of insulin: Secondary | ICD-10-CM | POA: Diagnosis not present

## 2017-12-08 DIAGNOSIS — Z955 Presence of coronary angioplasty implant and graft: Secondary | ICD-10-CM | POA: Insufficient documentation

## 2017-12-08 DIAGNOSIS — Z7982 Long term (current) use of aspirin: Secondary | ICD-10-CM | POA: Diagnosis not present

## 2017-12-08 DIAGNOSIS — E1121 Type 2 diabetes mellitus with diabetic nephropathy: Secondary | ICD-10-CM | POA: Insufficient documentation

## 2017-12-08 DIAGNOSIS — Z9119 Patient's noncompliance with other medical treatment and regimen: Secondary | ICD-10-CM | POA: Diagnosis not present

## 2017-12-08 DIAGNOSIS — I10 Essential (primary) hypertension: Secondary | ICD-10-CM

## 2017-12-08 HISTORY — DX: Chronic combined systolic (congestive) and diastolic (congestive) heart failure: I50.42

## 2017-12-08 HISTORY — DX: Old myocardial infarction: I25.2

## 2017-12-08 HISTORY — DX: Atherosclerotic heart disease of native coronary artery without angina pectoris: I25.10

## 2017-12-08 LAB — CBC
HCT: 36.9 % — ABNORMAL LOW (ref 39.0–52.0)
HCT: 37.3 % — ABNORMAL LOW (ref 39.0–52.0)
Hemoglobin: 11.5 g/dL — ABNORMAL LOW (ref 13.0–17.0)
Hemoglobin: 11.6 g/dL — ABNORMAL LOW (ref 13.0–17.0)
MCH: 28.3 pg (ref 26.0–34.0)
MCH: 28.2 pg (ref 26.0–34.0)
MCHC: 31.2 g/dL (ref 30.0–36.0)
MCHC: 31.1 g/dL (ref 30.0–36.0)
MCV: 90.7 fL (ref 78.0–100.0)
MCV: 90.8 fL (ref 78.0–100.0)
Platelets: 187 10*3/uL (ref 150–400)
Platelets: 195 10*3/uL (ref 150–400)
RBC: 4.07 MIL/uL — ABNORMAL LOW (ref 4.22–5.81)
RBC: 4.11 MIL/uL — ABNORMAL LOW (ref 4.22–5.81)
RDW: 13.7 % (ref 11.5–15.5)
RDW: 13.8 % (ref 11.5–15.5)
WBC: 4.9 10*3/uL (ref 4.0–10.5)
WBC: 4.3 10*3/uL (ref 4.0–10.5)

## 2017-12-08 LAB — BASIC METABOLIC PANEL
Anion gap: 9 (ref 5–15)
BUN: 15 mg/dL (ref 8–23)
CO2: 26 mmol/L (ref 22–32)
Calcium: 8.9 mg/dL (ref 8.9–10.3)
Chloride: 106 mmol/L (ref 98–111)
Creatinine, Ser: 1.37 mg/dL — ABNORMAL HIGH (ref 0.61–1.24)
GFR calc Af Amer: 53 mL/min — ABNORMAL LOW (ref >60)
GFR calc non Af Amer: 46 mL/min — ABNORMAL LOW (ref >60)
Glucose, Bld: 127 mg/dL — ABNORMAL HIGH (ref 70–99)
Potassium: 4.1 mmol/L (ref 3.5–5.1)
Sodium: 141 mmol/L (ref 135–145)

## 2017-12-08 LAB — GLUCOSE, CAPILLARY
Glucose-Capillary: 82 mg/dL (ref 70–99)
Glucose-Capillary: 88 mg/dL (ref 70–99)
Glucose-Capillary: 124 mg/dL — ABNORMAL HIGH (ref 70–99)

## 2017-12-08 LAB — CREATININE, SERUM
Creatinine, Ser: 1.32 mg/dL — ABNORMAL HIGH (ref 0.61–1.24)
GFR calc Af Amer: 55 mL/min — ABNORMAL LOW (ref >60)
GFR calc non Af Amer: 48 mL/min — ABNORMAL LOW (ref >60)

## 2017-12-08 LAB — BRAIN NATRIURETIC PEPTIDE: B Natriuretic Peptide: 160.8 pg/mL — ABNORMAL HIGH (ref 0.0–100.0)

## 2017-12-08 LAB — TROPONIN I
Troponin I: <0.03 ng/mL (ref <0.03)
Troponin I: <0.03 ng/mL (ref <0.03)
Troponin I: <0.03 ng/mL (ref <0.03)

## 2017-12-08 LAB — I-STAT TROPONIN, ED: Troponin i, poc: 0.02 ng/mL (ref 0.00–0.08)

## 2017-12-08 MED ORDER — TIMOLOL MALEATE 0.5 % OP SOLN
1.0000 [drp] | Freq: Every day | OPHTHALMIC | Status: DC
  Administered 2017-12-08 – 2017-12-09 (×2): 1 [drp] via OPHTHALMIC
  Filled 2017-12-08: qty 5

## 2017-12-08 MED ORDER — PREDNISOLONE ACETATE 1 % OP SUSP
1.0000 [drp] | Freq: Every day | OPHTHALMIC | Status: DC
  Administered 2017-12-08: 1 [drp] via OPHTHALMIC
  Filled 2017-12-08: qty 5

## 2017-12-08 MED ORDER — TIMOLOL MALEATE 0.5 % OP SOLN
1.0000 [drp] | Freq: Every day | OPHTHALMIC | Status: DC
  Filled 2017-12-08: qty 5

## 2017-12-08 MED ORDER — SODIUM CHLORIDE 0.9 % IV SOLN: 250.0000 mL | INTRAVENOUS | Status: DC | PRN

## 2017-12-08 MED ORDER — LOPERAMIDE HCL 2 MG PO CAPS: 2.0000 mg | ORAL_CAPSULE | ORAL | Status: DC | PRN

## 2017-12-08 MED ORDER — ATORVASTATIN CALCIUM 80 MG PO TABS
80.0000 mg | ORAL_TABLET | Freq: Every day | ORAL | Status: DC
  Administered 2017-12-08 – 2017-12-09 (×2): 80 mg via ORAL
  Filled 2017-12-08 (×2): qty 1

## 2017-12-08 MED ORDER — METOPROLOL SUCCINATE ER 25 MG PO TB24
25.0000 mg | ORAL_TABLET | Freq: Every day | ORAL | Status: DC
  Administered 2017-12-08 – 2017-12-09 (×2): 25 mg via ORAL
  Filled 2017-12-08 (×3): qty 1

## 2017-12-08 MED ORDER — NITROGLYCERIN 0.4 MG SL SUBL: 0.4000 mg | SUBLINGUAL_TABLET | SUBLINGUAL | Status: DC | PRN

## 2017-12-08 MED ORDER — TICAGRELOR 90 MG PO TABS
90.0000 mg | ORAL_TABLET | Freq: Two times a day (BID) | ORAL | Status: DC
  Administered 2017-12-08 – 2017-12-09 (×2): 90 mg via ORAL
  Filled 2017-12-08 (×3): qty 1

## 2017-12-08 MED ORDER — ONDANSETRON HCL 4 MG/2ML IJ SOLN: 4.0000 mg | Freq: Four times a day (QID) | INTRAMUSCULAR | Status: DC | PRN

## 2017-12-08 MED ORDER — LATANOPROST 0.005 % OP SOLN
1.0000 [drp] | Freq: Every day | OPHTHALMIC | Status: DC
  Administered 2017-12-08: 1 [drp] via OPHTHALMIC
  Filled 2017-12-08: qty 2.5

## 2017-12-08 MED ORDER — INSULIN ASPART 100 UNIT/ML ~~LOC~~ SOLN: 0.0000 [IU] | Freq: Three times a day (TID) | SUBCUTANEOUS | Status: DC

## 2017-12-08 MED ORDER — ASPIRIN EC 81 MG PO TBEC
81.0000 mg | DELAYED_RELEASE_TABLET | Freq: Every day | ORAL | Status: DC
  Administered 2017-12-08 – 2017-12-09 (×2): 81 mg via ORAL
  Filled 2017-12-08 (×2): qty 1

## 2017-12-08 MED ORDER — ENOXAPARIN SODIUM 40 MG/0.4ML ~~LOC~~ SOLN
40.0000 mg | SUBCUTANEOUS | Status: DC
  Administered 2017-12-08: 40 mg via SUBCUTANEOUS
  Filled 2017-12-08 (×2): qty 0.4

## 2017-12-08 MED ORDER — SODIUM CHLORIDE 0.9% FLUSH
3.0000 mL | Freq: Two times a day (BID) | INTRAVENOUS | Status: DC
  Administered 2017-12-08 – 2017-12-09 (×3): 3 mL via INTRAVENOUS

## 2017-12-08 MED ORDER — ATORVASTATIN CALCIUM 80 MG PO TABS: 80.0000 mg | ORAL_TABLET | Freq: Every day | ORAL | Status: DC

## 2017-12-08 MED ORDER — SERTRALINE HCL 50 MG PO TABS
50.0000 mg | ORAL_TABLET | Freq: Every day | ORAL | Status: DC
  Administered 2017-12-08: 50 mg via ORAL
  Filled 2017-12-08: qty 1

## 2017-12-08 MED ORDER — SODIUM CHLORIDE 0.9% FLUSH
3.0000 mL | INTRAVENOUS | Status: DC | PRN
Start: 2017-12-08 — End: 2017-12-09

## 2017-12-08 MED ORDER — TAMSULOSIN HCL 0.4 MG PO CAPS
0.4000 mg | ORAL_CAPSULE | Freq: Every day | ORAL | Status: DC
  Administered 2017-12-08 – 2017-12-09 (×2): 0.4 mg via ORAL
  Filled 2017-12-08 (×2): qty 1

## 2017-12-08 MED ORDER — TICAGRELOR 90 MG PO TABS: 90.0000 mg | ORAL_TABLET | Freq: Two times a day (BID) | ORAL | Status: DC

## 2017-12-08 MED ORDER — ACETAMINOPHEN 325 MG PO TABS: 650.0000 mg | ORAL_TABLET | ORAL | Status: DC | PRN

## 2017-12-08 MED ORDER — FUROSEMIDE 20 MG PO TABS
20.0000 mg | ORAL_TABLET | Freq: Every day | ORAL | Status: DC
  Administered 2017-12-08 – 2017-12-09 (×2): 20 mg via ORAL
  Filled 2017-12-08 (×2): qty 1

## 2017-12-08 MED ORDER — TICAGRELOR 90 MG PO TABS
180.0000 mg | ORAL_TABLET | Freq: Once | ORAL | Status: AC
  Administered 2017-12-08: 180 mg via ORAL
  Filled 2017-12-08: qty 2

## 2017-12-08 NOTE — ED Notes (Signed)
Patient transported to X-ray 

## 2017-12-08 NOTE — ED Triage Notes (Signed)
Per EMS pt from home, lives with son, woke up this morning and then began having 7/10 sharp centralized non radiating CP with no SHOB, N/V, or diaphoresis. Pt had STEMI 2 weeks ago with multiple stent placement.  18G LAC, 324 asa and 2 nitro given. Pt pain is now 5/10.

## 2017-12-08 NOTE — ED Notes (Signed)
Report taken from night shift RN - care assumed at this time  

## 2017-12-08 NOTE — ED Notes (Signed)
Resting quietly on stretcher with eyes closed - easily arousable - no complaints at this time; ccm showing sinus rhythm rate 74 with BBB; skin dark, warm, dry

## 2017-12-08 NOTE — ED Notes (Signed)
Report given to receiving RN for inpt bed assignment

## 2017-12-08 NOTE — H&P (Addendum)
The patient has been seen in conjunction with Tereso Newcomer, PA-C. All aspects of care have been considered and discussed. The patient has been personally interviewed, examined, and all clinical data has been reviewed.    Complaining of near continuous very focal left parasternal discomfort dissimilar to that associated with myocardial infarction.  Denies dyspnea, lying flat without distress.  There is a possibility that Brilinta has not been received for greater than a week.  No pericardial rub is heard.  Exam otherwise unremarkable.  ECG does not reveal ST-T abnormality to suggest acute ischemia or pericarditis.  Atypical chest discomfort -we will rule out myocardial infarction.  ER reloaded with Brilinta.  Resume twice daily Brilinta this p.m.  Serial cardiac markers.  Serial EKGs.  Observe overnight and if remains stable likely discharge tomorrow.     Cardiology Admission History and Physical:   Patient ID: [name]; MRN: [id number]; DOB: 1930-11-29   Admission date: 12/08/2017  Primary Care Provider: Clayborn Heron, MD Primary Cardiologist: Ricky Fair, MD   Chief Complaint: Chest pain  Patient Profile:   [name] is a 84 y.o. male with a history of coronary artery disease status post recent myocardial infarction treated with two-vessel PCI, combined systolic and diastolic heart failure diabetes, hypertension, hyperlipidemia, chronic kidney disease stage III who presents with recurrent chest pain.    History of Present Illness:   [name] was seen remotely by cardiology in 2016 for syncope felt to be related to orthostatic hypotension.  He was recently admitted 6/9-6/17 with an inferolateral ST elevation myocardial infarction.  Cardiac catheterization demonstrated vessel CAD.  He was treated with PCI with DES x2 to the second obtuse marginal and DES x1 to the proximal LCx.  He underwent staged PCI with DES x2 to the RCA.  He has residual disease in  the LAD (mid 70%, distal 85% and small D1 95%) which is treated medically.  Echocardiogram demonstrated mild diastolic dysfunction and reduced systolic function with EF 40-45%.  His hospital course was complicated by volume overload requiring IV Lasix and ileus requiring NG tube placement.  He was discharged to skilled nursing.  He was discharged from [facility] 11/29/2017  He was doing well after discharge from skilled nursing until this morning.  He was already awake.  While rolling over in bed, he started to note substernal chest discomfort.  He notes associated shortness of breath.  There was no radiation, nausea or diaphoresis.  His symptoms are similar to his previous myocardial infarction, but not as severe.  He received nitroglycerin in the ambulance and thinks that it did help his symptoms.  However, he continues to have recurrent episodes of discomfort in the emergency room.  These are occurring with movements and with deep inspiration.  Notes from the emergency room physician indicate that the patient has not been taking Brilinta.  The patient is not sure what medicines he is taking (he is legally blind) as his son helps.  Prior cardiac studies Cardiac catheterization 11/12/2017 LAD mid 70, distal 85; D1 95 LCx proximal stent patent; OM2 stent patent RCA proximal 30, mid 95, distal 80 PCI: 2.25 x 12 mm resolute Onyx DES to the distal RCA; 3.0 x 26 mm resolute Onyx DES to the mid RCA Post-Intervention Diagram    Echocardiogram 11/11/2017 Moderate concentric LVH, EF 40-45, grade 1 diastolic dysfunction, inferior, inferolateral, apical inferior akinesis   Cardiac catheterization 11/10/2017 LAD mid 80, distal 90; D1 95 LCx proximal 90; OM 2 100,  95 RCA mid 95, distal 80 PCI: 2 x 12 mm resolute Onyx DES distal OM2; 2.5 x 18 mm resolute DES to the OM2; 3 x 18 mm resolute DES to the proximal LCx Post-Intervention Diagram     Past Medical History:  Diagnosis Date  . Arthritis    "all over"  (11/12/2017)  . CAD (coronary artery disease) 11/11/2017   S/p IL STEMI 6/19:  S/p DES to pLCx and DES x 2 to OM2 // staged PCI 6/19:  DES x 2 to RCA // residual CAD at cath 6/19:  mLAD 80, dLAD 90, D1 95 tx medically // EF 40-45 by echo  . Chronic combined systolic and diastolic CHF (congestive heart failure) (HCC)    ischemic CM // s/p MI in 11/2017 >> Echo 6/19:  Moderate concentric LVH, EF 40-45, grade 1 diastolic dysfunction, inferior, inferolateral, apical inferior akinesis   . Chronic lower back pain   . CKD (chronic kidney disease), stage III (HCC)   . Constipation    takes an OTC med as needed  . Diabetic nephropathy (HCC)   . Gastric ulcer 1970s  . Glaucoma, both eyes    uses eye drops daily  . Gout    takes Allopurinol daily  . History of colon polyps   . History of ST elevation myocardial infarction (STEMI) 11/10/2017   PCI to LCx and staged PCI to RCA  . History of stress test    a. 09/2014 MV: EF 53%, no ischemia/infarct.  . Hyperlipidemia    takes Pravastatin daily  . Hypertension    takes Amlodipine and Losartan daily  . Insomnia    uses OTC sleep aide  . Joint pain   . Joint swelling    right knee   . Legally blind   . Macular degeneration   . Morbid obesity (HCC)   . Nocturia   . Peripheral edema   . Pleurisy 20+yrs ago  . Pneumonia    "I think once when I was a kid" (11/12/2017)  . Sleep apnea   . Stroke [facility])    "I've had them; don't know anything about them"; denies residual on 11/12/2017)  . Syncope    a. 09/2014 in setting of orthostasis; b. 10/2014 Event monitor: 19 beat run of asymp NSVT; c. 01/2015 Echo: EF 60-65%, no rwma.  . Type II diabetes mellitus (HCC)    takes Metformin daily  . Urinary frequency    takes Flomax daily    Past Surgical History:  Procedure Laterality Date  . BACK SURGERY    . CATARACT EXTRACTION W/ INTRAOCULAR LENS  IMPLANT, BILATERAL Bilateral   . COLONOSCOPY    . CORONARY ANGIOGRAPHY N/A 11/12/2017   Procedure: CORONARY  ANGIOGRAPHY;  Surgeon: Lennette Bihari, MD;  Location: [facility] INVASIVE CV LAB;  Service: Cardiovascular;  Laterality: N/A;  . CORONARY STENT INTERVENTION N/A 11/12/2017   Procedure: CORONARY STENT INTERVENTION;  Surgeon: Lennette Bihari, MD;  Location: [facility] INVASIVE CV LAB;  Service: Cardiovascular;  Laterality: N/A;  . CORONARY/GRAFT ACUTE MI REVASCULARIZATION N/A 11/10/2017   Procedure: Coronary/Graft Acute MI Revascularization;  Surgeon: Lennette Bihari, MD;  Location: [facility] INVASIVE CV LAB;  Service: Cardiovascular;  Laterality: N/A;  . EYE SURGERY    . GLAUCOMA SURGERY Bilateral   . INSERTION / PLACEMENT / REVISION NEUROSTIMULATOR  2000s  . KNEE ARTHROSCOPY Right 1990s  . LEFT HEART CATH AND CORONARY ANGIOGRAPHY N/A 11/10/2017   Procedure: LEFT HEART CATH AND CORONARY ANGIOGRAPHY;  Surgeon: Nicki Guadalajara  A, MD;  Location: [facility] INVASIVE CV LAB;  Service: Cardiovascular;  Laterality: N/A;  . MINI SHUNT INSERTION  09/26/2011   Procedure: INSERTION OF MINI SHUNT;  Surgeon: Chalmers Guest, MD;  Location: [facility] OR;  Service: Ophthalmology;  Laterality: Right;  Insertion of Ahmed shunt  . POSTERIOR FUSION LUMBAR SPINE  1990s  . REVERSE SHOULDER ARTHROPLASTY Right 09/21/2013   Procedure: RIGHT  SHOULDER ARTHROPLASTY REVERSE ;  Surgeon: [name], MD;  Location: [facility] OR;  Service: Orthopedics;  Laterality: Right;  . SHOULDER INJECTION Left 09/21/2013   Procedure: SHOULDER INJECTION;  Surgeon: [name], MD;  Location: [facility] OR;  Service: Orthopedics;  Laterality: Left;     Medications Prior to Admission: Prior to Admission medications   Medication Sig Start Date End Date Taking? Authorizing Provider  aspirin EC 81 MG EC tablet Take 1 tablet (81 mg total) by mouth daily. 11/19/17   Barrett, Joline Salt, PA-C  atorvastatin (LIPITOR) 80 MG tablet Take 1 tablet (80 mg total) by mouth daily. 11/29/17   Medina-Vargas, Monina C, NP  furosemide (LASIX) 20 MG tablet Take 1 tablet (20 mg total) by mouth daily. 11/29/17    Medina-Vargas, Monina C, NP  loperamide (IMODIUM) 2 MG capsule Take 2 mg by mouth as needed for diarrhea or loose stools.    [provider]  metoprolol succinate (TOPROL-XL) 25 MG 24 hr tablet Take 0.5 tablets (12.5 mg total) by mouth daily. 11/29/17   Medina-Vargas, Monina C, NP  nitroGLYCERIN (NITROSTAT) 0.4 MG SL tablet Place 1 tablet (0.4 mg total) under the tongue every 5 (five) minutes x 3 doses as needed for chest pain. 11/29/17   Medina-Vargas, Monina C, NP  prednisoLONE acetate (PRED FORTE) 1 % ophthalmic suspension Place 1 drop into the right eye at bedtime. 11/29/17   Medina-Vargas, Monina C, NP  sertraline (ZOLOFT) 50 MG tablet Take 1 tablet (50 mg total) by mouth at bedtime. 11/29/17   Medina-Vargas, Monina C, NP  tamsulosin (FLOMAX) 0.4 MG CAPS capsule Take 1 capsule (0.4 mg total) by mouth daily. 11/29/17   Medina-Vargas, Monina C, NP  ticagrelor (BRILINTA) 90 MG TABS tablet Take 1 tablet (90 mg total) by mouth 2 (two) times daily. 11/29/17   Medina-Vargas, Monina C, NP  timolol (TIMOPTIC) 0.5 % ophthalmic solution Place 1 drop into the right eye daily. 11/29/17   Medina-Vargas, Monina C, NP  TRAVATAN Z 0.004 % SOLN ophthalmic solution Place 1 drop into the left eye at bedtime. 11/29/17   Medina-Vargas, Monina C, NP     Allergies:   No Known Allergies  Social History:   Social History   Socioeconomic History  . Marital status: Widowed    Spouse name: Not on file  . Number of children: Not on file  . Years of education: Not on file  . Highest education level: Not on file  Occupational History  . Not on file  Social Needs  . Financial resource strain: Not on file  . Food insecurity:    Worry: Not on file    Inability: Not on file  . Transportation needs:    Medical: Not on file    Non-medical: Not on file  Tobacco Use  . Smoking status: Former Smoker    Packs/day: 2.00    Types: Cigarettes  . Smokeless tobacco: Never Used  . Tobacco comment: 11/12/2017 "nothing  since the 1980s"  Substance and Sexual Activity  . Alcohol use: Not Currently    Comment: 11/12/2017 "nothing since the  1980s"  . Drug use: Never  . Sexual activity: Not Currently  Lifestyle  . Physical activity:    Days per week: Not on file    Minutes per session: Not on file  . Stress: Not on file  Relationships  . Social connections:    Talks on phone: Not on file    Gets together: Not on file    Attends religious service: Not on file    Active member of club or organization: Not on file    Attends meetings of clubs or organizations: Not on file    Relationship status: Not on file  . Intimate partner violence:    Fear of current or ex partner: Not on file    Emotionally abused: Not on file    Physically abused: Not on file    Forced sexual activity: Not on file  Other Topics Concern  . Not on file  Social History Narrative   Legally blind.    Family History:   The patient's family history is negative for Anesthesia problems.    ROS:  Please see the history of present illness.  He has chronic diarrhea.  He denies fevers, cough, vomiting. All other ROS reviewed and negative.     Physical Exam/Data:   Vitals:   12/08/17 0645 12/08/17 0700 12/08/17 0715 12/08/17 0730  BP: 130/87 129/86 119/84 (!) 133/92  Pulse: 75 71 69 66  Resp: (!) 8 14 11 15   Temp:      TempSrc:      SpO2: 97% 97% 99% 99%   No intake or output data in the 24 hours ending 12/08/17 0940 There were no vitals filed for this visit. There is no height or weight on file to calculate BMI.  General:  Well nourished, well developed, in no acute distress  HEENT: normal Lymph: no adenopathy Neck: no JVD Endocrine:  No thryomegaly Vascular: No carotid bruits  Cardiac:  normal S1, S2; RRR; no murmur   Lungs:  clear to auscultation bilaterally, no wheezing, rhonchi or rales  Abd: soft, nontender, no hepatomegaly  Ext: no  edema Musculoskeletal:  No deformities  Skin: warm and dry  Neuro:  CNs 2-12  intact, no focal abnormalities noted Psych:  Normal affect    EKG:  The ECG that was done 12/08/2017  was personally reviewed and demonstrates normal sinus rhythm, heart rate 79, left axis deviation, right bundle branch block.  There has been no change since his previous tracing.  Relevant CV Studies: None since admit  Laboratory Data:  Chemistry Recent Labs  Lab 12/08/17 0553  NA 141  K 4.1  CL 106  CO2 26  GLUCOSE 127*  BUN 15  CREATININE 1.37*  CALCIUM 8.9  GFRNONAA 46*  GFRAA 53*  ANIONGAP 9    No results for input(s): PROT, ALBUMIN, AST, ALT, ALKPHOS, BILITOT in the last 168 hours. Hematology Recent Labs  Lab 12/08/17 0553  WBC 4.9  RBC 4.07*  HGB 11.5*  HCT 36.9*  MCV 90.7  MCH 28.3  MCHC 31.2  RDW 13.7  PLT 187   Cardiac Enzymes Recent Labs  Lab 12/08/17 0553  TROPONINI <0.03    Recent Labs  Lab 12/08/17 0558  TROPIPOC 0.02    BNPNo results for input(s): BNP, PROBNP in the last 168 hours.  DDimer No results for input(s): DDIMER in the last 168 hours.  Radiology/Studies:  Dg Chest 2 View  Result Date: 12/08/2017 CLINICAL DATA:  Patient woke up with sharp central chest  pain. STEMI 2 weeks ago with multiple stents placed. EXAM: CHEST - 2 VIEW COMPARISON:  11/10/2017 FINDINGS: Shallow inspiration with atelectasis in the lung bases. Mild cardiac enlargement. No vascular congestion, edema, or consolidation. No blunting of costophrenic angles. No pneumothorax. Thoracic spinal stimulator leads projected over the midthoracic region. Calcification of the aorta. Degenerative changes in the spine and shoulders. IMPRESSION: Shallow inspiration with linear atelectasis in the lung bases. Cardiac enlargement. No focal consolidation. Electronically Signed   By: Burman Nieves M.D.   On: 12/08/2017 06:11    Assessment and Plan:   1.  Chest Pain - He presents with chest pain with typical and atypical features.  His chest is sore raising the question of MSK chest  pain. However, we think he has not had Brilinta for the last week.  His symptoms and ECG do not suggest stent thrombosis.  He does have residual disease in the LAD which could cause chest pain.  There are no signs or symptoms of decompensated congestive heart failure.    -Observation   -Cycle serial Troponin levels  -Resume Brilinta 90 mg Twice daily   -Continue ASA, Lipitor 80  -Increase Toprol XL 12.5 >> 25 mg Once daily   -Decide in AM med Rx vs relook Cath (will keep NPO just in case)  2. CAD - Recent IL STEMI tx with DES to LCx, DES x 2 to OM2 and staged PCI with DES x 2 to RCA.  He has residual CAD in the LAD tx medically.  Adjust beta-blocker as noted.  Continue to cycle enzymes.  Resume Brilinta.  Continue ASA, high intensity statin.  3. Chronic Combined Systolic and Diastolic CHF - Secondary to ischemic cardiomyopathy.  Continue beta-blocker (consider changing Metoprolol to Carvedilol if BP will tolerate).  If BP will tolerate, consider adding low dose ACE inhibitor prior to DC.  No signs or symptoms of decompensated CHF.  Continue current dose of Lasix.  Check BNP.  4. Chronic Kidney Disease - Recent Creatinine stable.    5. Diabetes Mellitus Type 2 - Will cover with sliding scale insulin as needed.   6. Hypertension - Continue beta-blocker.  Add ACE inhibitor if able for CHF and renal protection given diabetes, CKD.   Severity of Illness: The appropriate patient status for this patient is OBSERVATION. Observation status is judged to be reasonable and necessary in order to provide the required intensity of service to ensure the patient's safety. The patient's presenting symptoms, physical exam findings, and initial radiographic and laboratory data in the context of their medical condition is felt to place them at decreased risk for further clinical deterioration. Furthermore, it is anticipated that the patient will be medically stable for discharge from the hospital within 2 midnights  of admission. The following factors support the patient status of observation.   " The patient's presenting symptoms include chest pain. " The physical exam findings include n/a. " The initial radiographic and laboratory data are EKG without acute changes, CXR without acute changes, initial Troponin negative.     For questions or updates, please contact [facility] Please consult [email or web address] for contact info under Cardiology/STEMI.    Signed, Tereso Newcomer, PA-C  12/08/2017 9:40 AM

## 2017-12-08 NOTE — H&P (Addendum)
The patient has been seen in conjunction with Tereso Newcomer, PA-C. All aspects of care have been considered and discussed. The patient has been personally interviewed, examined, and all clinical data has been reviewed.   Complaining of near continuous very focal left parasternal discomfort dissimilar to that associated with myocardial infarction.  Denies dyspnea, lying flat without distress.  There is a possibility that Brilinta has not been received for greater than a week.  No pericardial rub is heard.  Exam otherwise unremarkable.  ECG does not reveal ST-T abnormality to suggest acute ischemia or pericarditis.  Atypical chest discomfort -we will rule out myocardial infarction.  ER reloaded with Brilinta.  Resume twice daily Brilinta this p.m.  Serial cardiac markers.  Serial EKGs.  Observe overnight and if remains stable likely discharge tomorrow.

## 2017-12-08 NOTE — ED Provider Notes (Signed)
[facility] EMERGENCY DEPARTMENT Provider Note   CSN: [id number] Arrival date & time: 12/08/17  5621     History   Chief Complaint Chief Complaint  Patient presents with  . Chest Pain    HPI [name] is a 84 y.o. male.  HPI 84 year old male with extensive past medical history including recent STEMI and multiple PCI with stenting here with chest pain.  The patient states he woke up this morning and had a sharp, but also pressing chest pain.  He had mild nausea but no diaphoresis or vomiting.  He had no shortness of breath.  He states his symptoms feel somewhat similar to his STEMI which he sustained approximately 1 month ago.  Of note, the patient states he has been out of all of his blood thinners and new medications for the last week after leaving rehab.  He has not taken aspirin or Brilinta regularly.  He was given aspirin and nitroglycerin with relief by EMS.  Pain is currently only 2-3 out of 10.  Denies any worsening factors.  Relieved by aspirin and nitro as mentioned.  Past Medical History:  Diagnosis Date  . Arthritis    "all over" (11/12/2017)  . Chronic lower back pain   . CKD (chronic kidney disease), stage III (HCC)   . Constipation    takes an OTC med as needed  . Diabetic nephropathy (HCC)   . Diastolic dysfunction    a. 01/2015 Echo: EF 60-65%, no rwma, Gr1 DD, mildly dil LA, PaSP .  . Gastric ulcer 1970s  . Glaucoma, both eyes    uses eye drops daily  . Gout    takes Allopurinol daily  . History of colon polyps   . History of stress test    a. 09/2014 MV: EF 53%, no ischemia/infarct.  . Hyperlipidemia    takes Pravastatin daily  . Hypertension    takes Amlodipine and Losartan daily  . Insomnia    uses OTC sleep aide  . Joint pain   . Joint swelling    right knee   . Legally blind   . Macular degeneration   . Morbid obesity (HCC)   . Nocturia   . Peripheral edema   . Pleurisy 20+yrs ago  . Pneumonia    "I think once  when I was a kid" (11/12/2017)  . Sleep apnea   . STEMI (ST elevation myocardial infarction) (HCC) 11/10/2017  . Stroke [facility])    "I've had them; don't know anything about them"; denies residual on 11/12/2017)  . Syncope    a. 09/2014 in setting of orthostasis; b. 10/2014 Event monitor: 19 beat run of asymp NSVT; c. 01/2015 Echo: EF 60-65%, no rwma.  . Type II diabetes mellitus (HCC)    takes Metformin daily  . Urinary frequency    takes Flomax daily    Patient Active Problem List   Diagnosis Date Noted  . Ischemic cardiomyopathy 11/28/2017  . Neurocognitive deficits 11/19/2017  . Bilious vomiting   . Ileus, unspecified (HCC) 11/13/2017  . Sleep apnea 11/13/2017  . Obesity 11/13/2017  . Stage III chronic kidney disease (HCC) 11/11/2017  . Acute on chronic combined systolic and diastolic CHF (congestive heart failure) (HCC) 11/11/2017  . CAD S/P percutaneous coronary angioplasty 11/11/2017  . Acute ST elevation myocardial infarction (STEMI) of inferolateral wall (HCC) 11/10/2017  . ST elevation myocardial infarction (STEMI) (HCC)   . AKI (acute kidney injury) (HCC) 01/22/2015  . Lactic acidosis 01/22/2015  .  Syncope and collapse   . HTN (hypertension), benign   . Encephalopathy 09/07/2014  . Osteoarthrosis, unspecified whether generalized or localized, shoulder region 09/21/2013  . Gout 12/23/2012  . Hypotension 12/21/2012  . Type 2 diabetes mellitus with complication, without long-term current use of insulin (HCC) 12/21/2012  . Hyperlipidemia   . Morbid obesity (HCC)     Past Surgical History:  Procedure Laterality Date  . BACK SURGERY    . CATARACT EXTRACTION W/ INTRAOCULAR LENS  IMPLANT, BILATERAL Bilateral   . COLONOSCOPY    . CORONARY ANGIOGRAPHY N/A 11/12/2017   Procedure: CORONARY ANGIOGRAPHY;  Surgeon: Lennette Bihari, MD;  Location: [facility] INVASIVE CV LAB;  Service: Cardiovascular;  Laterality: N/A;  . CORONARY STENT INTERVENTION N/A 11/12/2017   Procedure: CORONARY STENT  INTERVENTION;  Surgeon: Lennette Bihari, MD;  Location: [facility] INVASIVE CV LAB;  Service: Cardiovascular;  Laterality: N/A;  . CORONARY/GRAFT ACUTE MI REVASCULARIZATION N/A 11/10/2017   Procedure: Coronary/Graft Acute MI Revascularization;  Surgeon: Lennette Bihari, MD;  Location: [facility] INVASIVE CV LAB;  Service: Cardiovascular;  Laterality: N/A;  . EYE SURGERY    . GLAUCOMA SURGERY Bilateral   . INSERTION / PLACEMENT / REVISION NEUROSTIMULATOR  2000s  . KNEE ARTHROSCOPY Right 1990s  . LEFT HEART CATH AND CORONARY ANGIOGRAPHY N/A 11/10/2017   Procedure: LEFT HEART CATH AND CORONARY ANGIOGRAPHY;  Surgeon: Lennette Bihari, MD;  Location: [facility] INVASIVE CV LAB;  Service: Cardiovascular;  Laterality: N/A;  . MINI SHUNT INSERTION  09/26/2011   Procedure: INSERTION OF MINI SHUNT;  Surgeon: Chalmers Guest, MD;  Location: [facility] OR;  Service: Ophthalmology;  Laterality: Right;  Insertion of Ahmed shunt  . POSTERIOR FUSION LUMBAR SPINE  1990s  . REVERSE SHOULDER ARTHROPLASTY Right 09/21/2013   Procedure: RIGHT  SHOULDER ARTHROPLASTY REVERSE ;  Surgeon: [name], MD;  Location: [facility] OR;  Service: Orthopedics;  Laterality: Right;  . SHOULDER INJECTION Left 09/21/2013   Procedure: SHOULDER INJECTION;  Surgeon: [name], MD;  Location: [facility] OR;  Service: Orthopedics;  Laterality: Left;        Home Medications    Prior to Admission medications   Medication Sig Start Date End Date Taking? Authorizing Provider  aspirin EC 81 MG EC tablet Take 1 tablet (81 mg total) by mouth daily. 11/19/17   Barrett, Joline Salt, PA-C  atorvastatin (LIPITOR) 80 MG tablet Take 1 tablet (80 mg total) by mouth daily. 11/29/17   Medina-Vargas, Monina C, NP  furosemide (LASIX) 20 MG tablet Take 1 tablet (20 mg total) by mouth daily. 11/29/17   Medina-Vargas, Monina C, NP  loperamide (IMODIUM) 2 MG capsule Take 2 mg by mouth as needed for diarrhea or loose stools.    [provider]  metoprolol succinate (TOPROL-XL) 25 MG 24 hr tablet  Take 0.5 tablets (12.5 mg total) by mouth daily. 11/29/17   Medina-Vargas, Monina C, NP  nitroGLYCERIN (NITROSTAT) 0.4 MG SL tablet Place 1 tablet (0.4 mg total) under the tongue every 5 (five) minutes x 3 doses as needed for chest pain. 11/29/17   Medina-Vargas, Monina C, NP  prednisoLONE acetate (PRED FORTE) 1 % ophthalmic suspension Place 1 drop into the right eye at bedtime. 11/29/17   Medina-Vargas, Monina C, NP  sertraline (ZOLOFT) 50 MG tablet Take 1 tablet (50 mg total) by mouth at bedtime. 11/29/17   Medina-Vargas, Monina C, NP  tamsulosin (FLOMAX) 0.4 MG CAPS capsule Take 1 capsule (0.4 mg total) by mouth daily. 11/29/17   Medina-Vargas, Monina  C, NP  ticagrelor (BRILINTA) 90 MG TABS tablet Take 1 tablet (90 mg total) by mouth 2 (two) times daily. 11/29/17   Medina-Vargas, Monina C, NP  timolol (TIMOPTIC) 0.5 % ophthalmic solution Place 1 drop into the right eye daily. 11/29/17   Medina-Vargas, Monina C, NP  TRAVATAN Z 0.004 % SOLN ophthalmic solution Place 1 drop into the left eye at bedtime. 11/29/17   Medina-Vargas, Margit Banda, NP    Family History Family History  Problem Relation Age of Onset  . Anesthesia problems Neg Hx     Social History Social History   Tobacco Use  . Smoking status: Former Smoker    Packs/day: 2.00    Types: Cigarettes  . Smokeless tobacco: Never Used  . Tobacco comment: 11/12/2017 "nothing since the 1980s"  Substance Use Topics  . Alcohol use: Not Currently    Comment: 11/12/2017 "nothing since the 1980s"  . Drug use: Never     Allergies   Patient has no known allergies.   Review of Systems Review of Systems  Constitutional: Negative for chills, fatigue and fever.  HENT: Negative for congestion and rhinorrhea.   Eyes: Negative for visual disturbance.  Respiratory: Positive for chest tightness and shortness of breath. Negative for cough and wheezing.   Cardiovascular: Positive for chest pain. Negative for leg swelling.  Gastrointestinal: Negative for  abdominal pain, diarrhea, nausea and vomiting.  Genitourinary: Negative for dysuria and flank pain.  Musculoskeletal: Negative for neck pain and neck stiffness.  Skin: Negative for rash and wound.  Allergic/Immunologic: Negative for immunocompromised state.  Neurological: Negative for syncope, weakness and headaches.  All other systems reviewed and are negative.    Physical Exam Updated Vital Signs BP 130/87   Pulse 75   Temp 98.4 F (36.9 C) (Oral)   Resp (!) 8   SpO2 97%   Physical Exam  Constitutional: He is oriented to person, place, and time. He appears well-developed and well-nourished. No distress.  HENT:  Head: Normocephalic and atraumatic.  Eyes: Conjunctivae are normal.  Neck: Neck supple.  Cardiovascular: Normal rate, regular rhythm and normal heart sounds. Exam reveals no friction rub.  No murmur heard. Pulmonary/Chest: Effort normal and breath sounds normal. No respiratory distress. He has no wheezes. He has no rales.  Abdominal: He exhibits no distension.  Musculoskeletal: He exhibits no edema.  Neurological: He is alert and oriented to person, place, and time. He exhibits normal muscle tone.  Skin: Skin is warm. Capillary refill takes less than 2 seconds.  Psychiatric: He has a normal mood and affect.  Nursing note and vitals reviewed.    ED Treatments / Results  Labs (all labs ordered are listed, but only abnormal results are displayed) Labs Reviewed  BASIC METABOLIC PANEL - Abnormal; Notable for the following components:      Result Value   Glucose, Bld 127 (*)    Creatinine, Ser 1.37 (*)    GFR calc non Af Amer 46 (*)    GFR calc Af Amer 53 (*)    All other components within normal limits  CBC - Abnormal; Notable for the following components:   RBC 4.07 (*)    Hemoglobin 11.5 (*)    HCT 36.9 (*)    All other components within normal limits  TROPONIN I  I-STAT TROPONIN, ED    EKG EKG Interpretation  Date/Time:  Sunday December 08 2017 05:47:43  EDT Ventricular Rate:  79 PR Interval:    QRS Duration: 149 QT Interval:  432 QTC Calculation: 496 R Axis:   -76 Text Interpretation:  Sinus rhythm Borderline prolonged PR interval RBBB and LAFB No significant change since last tracing Confirmed by Shaune Pollack [phone]) on 12/08/2017 7:21:33 AM   Radiology Dg Chest 2 View  Result Date: 12/08/2017 CLINICAL DATA:  Patient woke up with sharp central chest pain. STEMI 2 weeks ago with multiple stents placed. EXAM: CHEST - 2 VIEW COMPARISON:  11/10/2017 FINDINGS: Shallow inspiration with atelectasis in the lung bases. Mild cardiac enlargement. No vascular congestion, edema, or consolidation. No blunting of costophrenic angles. No pneumothorax. Thoracic spinal stimulator leads projected over the midthoracic region. Calcification of the aorta. Degenerative changes in the spine and shoulders. IMPRESSION: Shallow inspiration with linear atelectasis in the lung bases. Cardiac enlargement. No focal consolidation. Electronically Signed   By: Burman Nieves M.D.   On: 12/08/2017 06:11    Procedures Procedures (including critical care time)  Medications Ordered in ED Medications  ticagrelor (BRILINTA) tablet 180 mg (180 mg Oral Given 12/08/17 0647)     Initial Impression / Assessment and Plan / ED Course  I have reviewed the triage vital signs and the nursing notes.  Pertinent labs & imaging results that were available during my care of the patient were reviewed by me and considered in my medical decision making (see chart for details).     84 year old male status post recent PCI with stenting here with chest pain.  Chest pain is somewhat atypical in nature but he states is similar to his pain that he experienced during his recent STEMI.  Of note, patient has not been on his DAPT for the last week.  Discussed with cardiology on-call.  EKG is reassuring on arrival, but will give full Brilinta load.  He is already received aspirin.  Cardiology to see.   Otherwise, do not suspect PE, dissection, or alternative etiology.  Will follow-up cardiology recommendations regarding further management.  Patient care transferred to Dr. Rodena Medin at the end of my shift. Patient presentation, ED course, and plan of care discussed with review of all pertinent labs and imaging. Please see his/her note for further details regarding further ED course and disposition.   Final Clinical Impressions(s) / ED Diagnoses   Final diagnoses:  Atypical chest pain    ED Discharge Orders    None       Shaune Pollack, MD 12/08/17 [phone]

## 2017-12-08 NOTE — ED Notes (Signed)
[name], [id number]

## 2017-12-08 NOTE — ED Notes (Signed)
Resting quietly on stretcher with lights dimmed - denies any complaints at this time; ccm showing SR rate 73 with BBB

## 2017-12-08 NOTE — Progress Notes (Signed)
Patient had 11 wide QRS, he is asymptomatic with son at bedside. PA Weaver notified, will continue to monitor.

## 2017-12-08 NOTE — ED Notes (Signed)
assited to sitting position at bedside with feet dangling; urinal given - no complaints at this time

## 2017-12-09 ENCOUNTER — Encounter: Payer: Self-pay | Admitting: Physician Assistant

## 2017-12-09 DIAGNOSIS — I5042 Chronic combined systolic (congestive) and diastolic (congestive) heart failure: Secondary | ICD-10-CM | POA: Diagnosis not present

## 2017-12-09 DIAGNOSIS — R079 Chest pain, unspecified: Secondary | ICD-10-CM | POA: Diagnosis not present

## 2017-12-09 DIAGNOSIS — I130 Hypertensive heart and chronic kidney disease with heart failure and stage 1 through stage 4 chronic kidney disease, or unspecified chronic kidney disease: Secondary | ICD-10-CM | POA: Diagnosis not present

## 2017-12-09 DIAGNOSIS — I2510 Atherosclerotic heart disease of native coronary artery without angina pectoris: Secondary | ICD-10-CM

## 2017-12-09 DIAGNOSIS — R0789 Other chest pain: Secondary | ICD-10-CM | POA: Diagnosis not present

## 2017-12-09 DIAGNOSIS — E1122 Type 2 diabetes mellitus with diabetic chronic kidney disease: Secondary | ICD-10-CM | POA: Diagnosis not present

## 2017-12-09 LAB — BASIC METABOLIC PANEL
Anion gap: 12 (ref 5–15)
BUN: 18 mg/dL (ref 8–23)
CO2: 27 mmol/L (ref 22–32)
Calcium: 9.4 mg/dL (ref 8.9–10.3)
Chloride: 102 mmol/L (ref 98–111)
Creatinine, Ser: 1.29 mg/dL — ABNORMAL HIGH (ref 0.61–1.24)
GFR calc Af Amer: 57 mL/min — ABNORMAL LOW (ref >60)
GFR calc non Af Amer: 49 mL/min — ABNORMAL LOW (ref >60)
Glucose, Bld: 102 mg/dL — ABNORMAL HIGH (ref 70–99)
Potassium: 3.9 mmol/L (ref 3.5–5.1)
Sodium: 141 mmol/L (ref 135–145)

## 2017-12-09 LAB — GLUCOSE, CAPILLARY
Glucose-Capillary: 106 mg/dL — ABNORMAL HIGH (ref 70–99)
Glucose-Capillary: 106 mg/dL — ABNORMAL HIGH (ref 70–99)

## 2017-12-09 LAB — TROPONIN I: Troponin I: <0.03 ng/mL (ref <0.03)

## 2017-12-09 MED ORDER — TICAGRELOR 90 MG PO TABS: 90.0000 mg | ORAL_TABLET | Freq: Two times a day (BID) | ORAL | 11 refills | Status: DC

## 2017-12-09 MED ORDER — NITROGLYCERIN 0.4 MG SL SUBL: 0.4000 mg | SUBLINGUAL_TABLET | SUBLINGUAL | 2 refills | Status: DC | PRN

## 2017-12-09 MED ORDER — METOPROLOL SUCCINATE ER 25 MG PO TB24: 25.0000 mg | ORAL_TABLET | Freq: Every day | ORAL | 5 refills | Status: DC

## 2017-12-09 MED ORDER — ATORVASTATIN CALCIUM 80 MG PO TABS: 80.0000 mg | ORAL_TABLET | Freq: Every day | ORAL | 5 refills | Status: DC

## 2017-12-09 NOTE — Progress Notes (Signed)
Pts family states that pt needs Brilinta, liptor, and ntg scripts. PA paged and said she will send electronically.

## 2017-12-09 NOTE — Progress Notes (Signed)
Progress Note  Patient Name: [name] Date of Encounter: 12/09/2017  Primary Cardiologist: Thurmon Fair, MD   Subjective   84 year old gentleman with a history of coronary artery disease.  He is admitted with some atypical chest pains.  Troponin levels have been negative.  EKG is unremarkable.  He is feeling much better this morning.  Is not having any further episodes of chest discomfort.  Inpatient Medications    Scheduled Meds: . aspirin EC  81 mg Oral Daily  . atorvastatin  80 mg Oral Daily  . enoxaparin (LOVENOX) injection  40 mg Subcutaneous Q24H  . furosemide  20 mg Oral Daily  . insulin aspart  0-15 Units Subcutaneous TID WC  . latanoprost  1 drop Left Eye QHS  . metoprolol succinate  25 mg Oral Daily  . prednisoLONE acetate  1 drop Right Eye QHS  . sertraline  50 mg Oral QHS  . sodium chloride flush  3 mL Intravenous Q12H  . tamsulosin  0.4 mg Oral Daily  . ticagrelor  90 mg Oral BID  . timolol  1 drop Right Eye Daily   Continuous Infusions: . sodium chloride     PRN Meds: sodium chloride, acetaminophen, loperamide, nitroGLYCERIN, ondansetron (ZOFRAN) IV, sodium chloride flush   Vital Signs    Vitals:   12/08/17 2000 12/09/17 0001 12/09/17 0500 12/09/17 0552  BP: 117/74 (!) 156/93  (!) 155/90  Pulse: 73 76  72  Resp: 18 18  18   Temp: 98 F (36.7 C) 97.8 F (36.6 C)  98.4 F (36.9 C)  TempSrc: Oral Oral  Oral  SpO2: 98% 99%  95%  Weight:   225 lb 1.4 oz (102.1 kg)     Intake/Output Summary (Last 24 hours) at 12/09/2017 0944 Last data filed at 12/09/2017 0858 Gross per 24 hour  Intake 363 ml  Output 1300 ml  Net -937 ml   Filed Weights   12/08/17 1215 12/09/17 0500  Weight: 229 lb 0.9 oz (103.9 kg) 225 lb 1.4 oz (102.1 kg)    Telemetry    Normal sinus rhythm.  He had an 11 beat run of idioventricular rhythm at 90 bpm. .   - Personally Reviewed  ECG     NSR  - Personally Reviewed  Physical Exam   GEN:  Elderly gentleman, no  acute distress Neck: No JVD Cardiac: RRR, no murmurs, rubs, or gallops.  Respiratory: Clear to auscultation bilaterally. GI: Soft, nontender, non-distended  MS: No edema; No deformity. Neuro:  Nonfocal  Psych: Normal affect   Labs    Chemistry Recent Labs  Lab 12/08/17 0553 12/08/17 1122 12/09/17 0226  NA 141  --  141  K 4.1  --  3.9  CL 106  --  102  CO2 26  --  27  GLUCOSE 127*  --  102*  BUN 15  --  18  CREATININE 1.37* 1.32* 1.29*  CALCIUM 8.9  --  9.4  GFRNONAA 46* 48* 49*  GFRAA 53* 55* 57*  ANIONGAP 9  --  12     Hematology Recent Labs  Lab 12/08/17 0553 12/08/17 1122  WBC 4.9 4.3  RBC 4.07* 4.11*  HGB 11.5* 11.6*  HCT 36.9* 37.3*  MCV 90.7 90.8  MCH 28.3 28.2  MCHC 31.2 31.1  RDW 13.7 13.8  PLT 187 195    Cardiac Enzymes Recent Labs  Lab 12/08/17 0553 12/08/17 1122 12/08/17 1849 12/09/17 0226  TROPONINI <0.03 <0.03 <0.03 <0.03    Recent  Labs  Lab 12/08/17 0558  TROPIPOC 0.02     BNP Recent Labs  Lab 12/08/17 1122  BNP 160.8*     DDimer No results for input(s): DDIMER in the last 168 hours.   Radiology    Dg Chest 2 View  Result Date: 12/08/2017 CLINICAL DATA:  Patient woke up with sharp central chest pain. STEMI 2 weeks ago with multiple stents placed. EXAM: CHEST - 2 VIEW COMPARISON:  11/10/2017 FINDINGS: Shallow inspiration with atelectasis in the lung bases. Mild cardiac enlargement. No vascular congestion, edema, or consolidation. No blunting of costophrenic angles. No pneumothorax. Thoracic spinal stimulator leads projected over the midthoracic region. Calcification of the aorta. Degenerative changes in the spine and shoulders. IMPRESSION: Shallow inspiration with linear atelectasis in the lung bases. Cardiac enlargement. No focal consolidation. Electronically Signed   By: Burman Nieves M.D.   On: 12/08/2017 06:11    Cardiac Studies     Patient Profile     84 y.o. male with history of recent stenting.  He has a history  of combined systolic and diastolic congestive heart failure, hypertension, hyperlipidemia, chronic kidney disease.  Assessment & Plan    1.  Chest pain: The patient's chest pain was rather atypical.  He states that it was different than his presenting episodes of chest pain last month. He was discharged from skilled nursing facility about a week ago and there is doubt as to whether he is been receiving his Brilinta. The patient is legally blind   and his son helps out with his medications.  He appears to be very stable and should be able to be discharged home. The patient was at home prior to admission and should be able to return to home   Continue Brilinta 90 mg twice a day, aspirin 81 mg a day, Lasix 20 mg a day, eyedrops, Flomax 0.4 mg a day Zoloft 50 mg at bedtime Toprol-XL 25 mg a day and other home meds    For questions or updates, please contact [facility] Please consult [email or web address] for contact info under Cardiology/STEMI.      Signed, Kristeen Miss, MD  12/09/2017, 9:44 AM

## 2017-12-09 NOTE — Discharge Summary (Addendum)
Discharge Summary    Patient ID: [name],  MRN: [id number], DOB/AGE: May 20, 1931 84 y.o.  Admit date: 12/08/2017 Discharge date: 12/09/2017  Primary Care Provider: Clayborn Heron Primary Cardiologist: Thurmon Fair, MD  Discharge Diagnoses    Active Problems:   Chest pain   Allergies No Known Allergies  Diagnostic Studies/Procedures    No studies this admission.  Previous admission 11/2017 Cardiac catheterization 11/12/2017 LAD mid 70, distal 85; D1 95 LCx proximal stent patent; OM2 stent patent RCA proximal 30, mid 95, distal 80 PCI: 2.25 x 12 mm resolute Onyx DES to the distal RCA; 3.0 x 26 mm resolute Onyx DES to the mid RCA Post-Intervention Diagram    Echocardiogram 11/11/2017 Moderate concentric LVH, EF 40-45, grade 1 diastolic dysfunction, inferior, inferolateral, apical inferior akinesis   Cardiac catheterization 11/10/2017 LAD mid 80, distal 90; D1 95 LCx proximal 90; OM 2 100, 95 RCA mid 95, distal 80 PCI: 2 x 12 mm resolute Onyx DES distal OM2; 2.5 x 18 mm resolute DES to the OM2; 3 x 18 mm resolute DES to the proximal LCx Post-Intervention Diagram       History of Present Illness     [name] is a 84 y.o. male with a history of coronary artery disease status post recent myocardial infarction treated with two-vessel PCI, combined systolic and diastolic heart failure diabetes, hypertension, hyperlipidemia, chronic kidney disease stage III who presented t[facility] on 12/08/17 with recurrent chest pain.    He was recently admitted 6/9-6/17 with an inferolateral ST elevation myocardial infarction.  Cardiac catheterization demonstrated 2 vessel CAD.  He was treated with PCI with DES x2 to the second obtuse marginal and DES x1 to the proximal LCx.  He underwent staged PCI with DES x2 to the RCA.  He has residual disease in the LAD (mid 70%, distal 85% and small D1 95%) which is treated medically.  Echocardiogram demonstrated mild diastolic  dysfunction and reduced systolic function with EF 40-45%.  His hospital course was complicated by volume overload requiring IV Lasix and ileus requiring NG tube placement.  He was discharged to skilled nursing.  He was discharged from [facility] 11/29/2017  He was doing well after discharge from skilled nursing until the morning of 12/08/17.  He was already awake.  While rolling over in bed, he started to note substernal chest discomfort.  He noted associated shortness of breath.  There was no radiation, nausea or diaphoresis.  His symptoms were noted to be similar to his previous myocardial infarction, but not as severe.  He received nitroglycerin in the ambulance and noted that it did help his symptoms.  However, he continued to have recurrent episodes of discomfort in the emergency room. Episodes occurred with movement and with deep inspiration.  Notes from the emergency room physician indicated that the patient has not been taking Brilinta.  The patient was not sure what medicines he was taking (he is legally blind) as his son helps.  He was seen by Dr. Katrinka Blazing in the ED. EKG reviewed and did not reveal ST-T abnormality to suggest acute ischemia or pericarditis. Physical exam was also negative for pericardial rub. His CP was overall felt to be atypical. However given his recent MI, he was admitted for overnight observation and cycling of enzymes for rule-out.   Hospital Course     Pt was admitted to telemetry and reloaded with Brilinta. After load, he was placed on 90 mg BID. Cardiac enzymes were cycled  and negative x 3. His chest pain spontaneously resolved and he denied any recurrence on 12/09/17. He was last seen and examined by Dr. Elease Hashimoto, who determined he was stable for d/c home. He new Rx for Brilinta was written. He was continued on ASA, metoprolol Lipitor and Lasix. Post hospital f/u has been arranged.   Consultants: none   _____________  Discharge Vitals Blood pressure (!) 155/90, pulse 72,  temperature 98.4 F (36.9 C), temperature source Oral, resp. rate 18, weight 225 lb 1.4 oz (102.1 kg), SpO2 95 %.  Filed Weights   12/08/17 1215 12/09/17 0500  Weight: 229 lb 0.9 oz (103.9 kg) 225 lb 1.4 oz (102.1 kg)    Labs & Radiologic Studies    CBC Recent Labs    12/08/17 0553 12/08/17 1122  WBC 4.9 4.3  HGB 11.5* 11.6*  HCT 36.9* 37.3*  MCV 90.7 90.8  PLT 187 195   Basic Metabolic Panel Recent Labs    16/10/96 0553 12/08/17 1122 12/09/17 0226  NA 141  --  141  K 4.1  --  3.9  CL 106  --  102  CO2 26  --  27  GLUCOSE 127*  --  102*  BUN 15  --  18  CREATININE 1.37* 1.32* 1.29*  CALCIUM 8.9  --  9.4   Liver Function Tests No results for input(s): AST, ALT, ALKPHOS, BILITOT, PROT, ALBUMIN in the last 72 hours. No results for input(s): LIPASE, AMYLASE in the last 72 hours. Cardiac Enzymes Recent Labs    12/08/17 1122 12/08/17 1849 12/09/17 0226  TROPONINI <0.03 <0.03 <0.03   BNP Invalid input(s): POCBNP D-Dimer No results for input(s): DDIMER in the last 72 hours. Hemoglobin A1C No results for input(s): HGBA1C in the last 72 hours. Fasting Lipid Panel No results for input(s): CHOL, HDL, LDLCALC, TRIG, CHOLHDL, LDLDIRECT in the last 72 hours. Thyroid Function Tests No results for input(s): TSH, T4TOTAL, T3FREE, THYROIDAB in the last 72 hours.  Invalid input(s): FREET3 _____________  Dg Chest 2 View  Result Date: 12/08/2017 CLINICAL DATA:  Patient woke up with sharp central chest pain. STEMI 2 weeks ago with multiple stents placed. EXAM: CHEST - 2 VIEW COMPARISON:  11/10/2017 FINDINGS: Shallow inspiration with atelectasis in the lung bases. Mild cardiac enlargement. No vascular congestion, edema, or consolidation. No blunting of costophrenic angles. No pneumothorax. Thoracic spinal stimulator leads projected over the midthoracic region. Calcification of the aorta. Degenerative changes in the spine and shoulders. IMPRESSION: Shallow inspiration with linear  atelectasis in the lung bases. Cardiac enlargement. No focal consolidation. Electronically Signed   By: Burman Nieves M.D.   On: 12/08/2017 06:11   Dg Abd 1 View  Result Date: 11/13/2017 CLINICAL DATA:  Abdomen pains. EXAM: ABDOMEN - 1 VIEW COMPARISON:  None. FINDINGS: The entire abdomen could not be visualized on a single AP radiograph, due to body habitus, therefore high and low exposures were performed. Despite this, portions of the RIGHT abdomen are not visualized in their entirety. Gaseous distended stomach. Prominent LEFT mid abdominal small bowel loop could represent ileus or partial obstruction. Colonic gas is present all the way to the rectosigmoid region. A spinal cord stimulator overlies the lower thoracic region with the battery pack posteriorly on the RIGHT. Lumbar spondylosis. IMPRESSION: AP supine radiographs. Gaseous gastric distention. Single prominent LEFT mid abdominal small bowel loop, uncertain significance. Electronically Signed   By: Elsie Stain M.D.   On: 11/13/2017 13:40   Dg Chest [facility] 1 View  Result  Date: 11/10/2017 CLINICAL DATA:  Chest pain EXAM: PORTABLE CHEST 1 VIEW COMPARISON:  09/07/2014 chest radiograph. FINDINGS: Stimulator leads overlie the lower thoracic spine. Stable cardiomediastinal silhouette with top-normal heart size. No pneumothorax. No pleural effusion. Hypo inspiratory radiograph. No pulmonary edema. No acute consolidative airspace disease. IMPRESSION: Hypo inspiratory radiograph with no active cardiopulmonary disease. Electronically Signed   By: Delbert Phenix M.D.   On: 11/10/2017 11:17   Dg Abd Portable 1v-small Bowel Obstruction Protocol-initial, 8 Hr Delay  Result Date: 11/14/2017 CLINICAL DATA:  Small bowel obstruction.  Delayed film. EXAM: PORTABLE ABDOMEN - 1 VIEW COMPARISON:  Radiograph yesterday 2219 hour FINDINGS: Administered enteric contrast is visualized throughout the ascending, transverse, and descending and rectosigmoid colon. No small  bowel dilatation. Enteric tube tip and side-port below the diaphragm in the stomach. Some residual high-density contrast remains in the gastric lumen. IMPRESSION: Administered enteric contrast seen throughout the entire colon. Electronically Signed   By: Rubye Oaks M.D.   On: 11/14/2017 04:32   Dg Abd Portable 1v  Result Date: 11/13/2017 CLINICAL DATA:  Nasogastric tube placement EXAM: PORTABLE ABDOMEN - 1 VIEW COMPARISON:  11/13/2017 FINDINGS: Nasogastric tube side port is within the stomach. The tip is near the gastroduodenal junction. The examination is otherwise unchanged. IMPRESSION: NG tube tip at the gastroduodenal junction and side port within the stomach. Electronically Signed   By: Deatra Robinson M.D.   On: 11/13/2017 22:33   Dg Abd Portable 1v-small Bowel Protocol-position Verification  Result Date: 11/13/2017 CLINICAL DATA:  Evaluate placement of NG tube. EXAM: PORTABLE ABDOMEN - 1 VIEW COMPARISON:  11/13/2017 FINDINGS: The nasogastric tube tip projects over the body of the stomach. There is contrast opacification of the stomach. Assuming the contrast was administered via the NG tube at the time this radiograph was obtained this confirms intraluminal placement. IMPRESSION: 1. Tip of NG tube projects over the stomach. 2. Contrast opacification of the gastric lumen which, assuming was administered via the NG tube at the time this radiograph was obtained, confirms intraluminal placement Electronically Signed   By: Signa Kell M.D.   On: 11/13/2017 20:34   Disposition   Pt is being discharged home today in good condition.  Follow-up Plans & Appointments    Follow-up Information    Rankins, Fanny Dance, MD. Go on 12/18/2017.   Specialty:  Family Medicine Why:  @3 :30pm Contact information: [address] [place] [address] [phone]        Beatrice Lecher, PA-C. Go on 12/24/2017.   Specialties:  Cardiology, Physician Assistant Why:  @11 :45am Contact  information: [address] [place] [address] [phone]          Discharge Instructions    Diet - low sodium heart healthy   Complete by:  As directed    Increase activity slowly   Complete by:  As directed       Discharge Medications   Allergies as of 12/09/2017   No Known Allergies     Medication List    TAKE these medications   aspirin 81 MG EC tablet Take 1 tablet (81 mg total) by mouth daily. What changed:  Another medication with the same name was removed. Continue taking this medication, and follow the directions you see here.   atorvastatin 80 MG tablet Commonly known as:  LIPITOR Take 1 tablet (80 mg total) by mouth daily.   furosemide 20 MG tablet Commonly known as:  LASIX Take 1 tablet (20 mg total) by  mouth daily.   ICY HOT ADVANCED RELIEF 16-11 % Crea Generic drug:  Menthol-Camphor Apply 1 application topically as needed (knee pain).   loperamide 2 MG capsule Commonly known as:  IMODIUM Take 2 mg by mouth as needed for diarrhea or loose stools.   magnesium hydroxide 400 MG/5ML suspension Commonly known as:  MILK OF MAGNESIA Take 30 mLs by mouth daily as needed for mild constipation.   metoprolol succinate 25 MG 24 hr tablet Commonly known as:  TOPROL-XL Take 1 tablet (25 mg total) by mouth daily. What changed:  how much to take   nitroGLYCERIN 0.4 MG SL tablet Commonly known as:  NITROSTAT Place 1 tablet (0.4 mg total) under the tongue every 5 (five) minutes x 3 doses as needed for chest pain.   prednisoLONE acetate 1 % ophthalmic suspension Commonly known as:  PRED FORTE Place 1 drop into the right eye at bedtime.   sertraline 50 MG tablet Commonly known as:  ZOLOFT Take 1 tablet (50 mg total) by mouth at bedtime.   tamsulosin 0.4 MG Caps capsule Commonly known as:  FLOMAX Take 1 capsule (0.4 mg total) by mouth daily.   ticagrelor 90 MG Tabs tablet Commonly known as:  BRILINTA Take 1 tablet (90 mg total) by  mouth 2 (two) times daily.   timolol 0.5 % ophthalmic solution Commonly known as:  TIMOPTIC Place 1 drop into the right eye daily.   TRAVATAN Z 0.004 % Soln ophthalmic solution Generic drug:  Travoprost (BAK Free) Place 1 drop into the left eye at bedtime.        Acute coronary syndrome (MI, NSTEMI, STEMI, etc) this admission?: No.    Outstanding Labs/Studies   None   Duration of Discharge Encounter   Greater than 30 minutes including physician time.  Signed, Robbie Lis, PA-C 12/09/2017, 12:31 PM   Attending Note:   The patient was seen and examined.  Agree with assessment and plan as noted above.  Changes made to the above note as needed.  Patient seen and independently examined with Boyce Medici, PA .   We discussed all aspects of the encounter. I agree with the assessment and plan as stated above.  1.  Chest pain: The patient's chest pain was rather atypical.  He states that it was different than his presenting episodes of chest pain last month. He was discharged from skilled nursing facility about a week ago and there is doubt as to whether he is been receiving his Brilinta. The patient is legally blind   and his son helps out with his medications.  He appears to be very stable and should be able to be discharged home. The patient was at home prior to admission and should be able to return to home   Continue Brilinta 90 mg twice a day, aspirin 81 mg a day, Lasix 20 mg a day, eyedrops, Flomax 0.4 mg a day Zoloft 50 mg at bedtime Toprol-XL 25 mg a day and other home meds     I have spent a total of 40 minutes with patient reviewing hospital  notes , telemetry, EKGs, labs and examining patient as well as establishing an assessment and plan that was discussed with the patient. > 50% of time was spent in direct patient care.    Vesta Mixer, Montez Hageman., MD, [facility] 12/09/2017, 5:43 PM [address],  [address] Office [phone] Pager [phone]

## 2017-12-16 ENCOUNTER — Emergency Department (HOSPITAL_COMMUNITY)
Admission: EM | Admit: 2017-12-16 | Discharge: 2017-12-16 | Disposition: A | Discharge status: Home / Self Care | Payer: Medicare HMO | Attending: Emergency Medicine | Admitting: Emergency Medicine

## 2017-12-16 ENCOUNTER — Encounter (HOSPITAL_COMMUNITY): Payer: Self-pay | Admitting: Emergency Medicine

## 2017-12-16 ENCOUNTER — Other Ambulatory Visit: Payer: Self-pay

## 2017-12-16 ENCOUNTER — Emergency Department (HOSPITAL_COMMUNITY): Payer: Medicare HMO

## 2017-12-16 DIAGNOSIS — R55 Syncope and collapse: Secondary | ICD-10-CM

## 2017-12-16 DIAGNOSIS — E860 Dehydration: Secondary | ICD-10-CM | POA: Diagnosis not present

## 2017-12-16 DIAGNOSIS — I130 Hypertensive heart and chronic kidney disease with heart failure and stage 1 through stage 4 chronic kidney disease, or unspecified chronic kidney disease: Secondary | ICD-10-CM | POA: Diagnosis not present

## 2017-12-16 DIAGNOSIS — Z7982 Long term (current) use of aspirin: Secondary | ICD-10-CM | POA: Insufficient documentation

## 2017-12-16 DIAGNOSIS — E119 Type 2 diabetes mellitus without complications: Secondary | ICD-10-CM | POA: Diagnosis not present

## 2017-12-16 DIAGNOSIS — N183 Chronic kidney disease, stage 3 (moderate): Secondary | ICD-10-CM | POA: Diagnosis not present

## 2017-12-16 DIAGNOSIS — I2510 Atherosclerotic heart disease of native coronary artery without angina pectoris: Secondary | ICD-10-CM | POA: Insufficient documentation

## 2017-12-16 DIAGNOSIS — Z79899 Other long term (current) drug therapy: Secondary | ICD-10-CM | POA: Diagnosis not present

## 2017-12-16 DIAGNOSIS — Z87891 Personal history of nicotine dependence: Secondary | ICD-10-CM | POA: Insufficient documentation

## 2017-12-16 DIAGNOSIS — R197 Diarrhea, unspecified: Secondary | ICD-10-CM | POA: Diagnosis not present

## 2017-12-16 DIAGNOSIS — I5042 Chronic combined systolic (congestive) and diastolic (congestive) heart failure: Secondary | ICD-10-CM | POA: Diagnosis not present

## 2017-12-16 LAB — COMPREHENSIVE METABOLIC PANEL
ALT: 19 U/L (ref 0–44)
AST: 20 U/L (ref 15–41)
Albumin: 3.7 g/dL (ref 3.5–5.0)
Alkaline Phosphatase: 87 U/L (ref 38–126)
Anion gap: 9 (ref 5–15)
BUN: 30 mg/dL — ABNORMAL HIGH (ref 8–23)
CO2: 23 mmol/L (ref 22–32)
Calcium: 9.5 mg/dL (ref 8.9–10.3)
Chloride: 108 mmol/L (ref 98–111)
Creatinine, Ser: 2.04 mg/dL — ABNORMAL HIGH (ref 0.61–1.24)
GFR calc Af Amer: 33 mL/min — ABNORMAL LOW (ref >60)
GFR calc non Af Amer: 28 mL/min — ABNORMAL LOW (ref >60)
Glucose, Bld: 141 mg/dL — ABNORMAL HIGH (ref 70–99)
Potassium: 4.5 mmol/L (ref 3.5–5.1)
Sodium: 140 mmol/L (ref 135–145)
Total Bilirubin: 0.8 mg/dL (ref 0.3–1.2)
Total Protein: 7.5 g/dL (ref 6.5–8.1)

## 2017-12-16 LAB — CBC WITH DIFFERENTIAL/PLATELET
Abs Immature Granulocytes: 0 10*3/uL (ref 0.0–0.1)
Basophils Absolute: 0 10*3/uL (ref 0.0–0.1)
Basophils Relative: 0 %
Eosinophils Absolute: 0.1 10*3/uL (ref 0.0–0.7)
Eosinophils Relative: 2 %
HCT: 43.4 % (ref 39.0–52.0)
Hemoglobin: 13.5 g/dL (ref 13.0–17.0)
Immature Granulocytes: 1 %
Lymphocytes Relative: 12 %
Lymphs Abs: 0.8 10*3/uL (ref 0.7–4.0)
MCH: 28.3 pg (ref 26.0–34.0)
MCHC: 31.1 g/dL (ref 30.0–36.0)
MCV: 91 fL (ref 78.0–100.0)
Monocytes Absolute: 0.6 10*3/uL (ref 0.1–1.0)
Monocytes Relative: 9 %
Neutro Abs: 4.8 10*3/uL (ref 1.7–7.7)
Neutrophils Relative %: 76 %
Platelets: 201 10*3/uL (ref 150–400)
RBC: 4.77 MIL/uL (ref 4.22–5.81)
RDW: 13.6 % (ref 11.5–15.5)
WBC: 6.4 10*3/uL (ref 4.0–10.5)

## 2017-12-16 LAB — GASTROINTESTINAL PANEL BY PCR, STOOL (REPLACES STOOL CULTURE)

## 2017-12-16 LAB — C DIFFICILE QUICK SCREEN W PCR REFLEX
C Diff antigen: NEGATIVE
C Diff interpretation: NOT DETECTED
C Diff toxin: NEGATIVE

## 2017-12-16 MED ORDER — SODIUM CHLORIDE 0.9 % IV BOLUS
500.0000 mL | Freq: Once | INTRAVENOUS | Status: AC
  Administered 2017-12-16: 500 mL via INTRAVENOUS

## 2017-12-16 NOTE — ED Provider Notes (Signed)
[facility] EMERGENCY DEPARTMENT Provider Note  CSN: [id number] Arrival date & time: 12/16/17 4098  Chief Complaint(s) Loss of Consciousness  HPI [name] is a 84 y.o. male recently discharged after admission for ST segment elevation MI requiring stenting to RCA presents for syncope in the setting of diarrhea.  The history is provided by the patient.  Loss of Consciousness   This is a new problem. The current episode started 1 to 2 hours ago. Episode frequency: once. The problem has been resolved. The problem is associated with bowel movements. Associated symptoms include diaphoresis, light-headedness and nausea. Pertinent negatives include bladder incontinence, chest pain, dizziness, fever and vomiting. Associated symptoms comments: 3 days of diarrhea.    Past Medical History Past Medical History:  Diagnosis Date  . Arthritis    "all over" (11/12/2017)  . CAD (coronary artery disease) 11/11/2017   S/p IL STEMI 6/19:  S/p DES to pLCx and DES x 2 to OM2 // staged PCI 6/19:  DES x 2 to RCA // residual CAD at cath 6/19:  mLAD 80, dLAD 90, D1 95 tx medically // EF 40-45 by echo  . Chronic combined systolic and diastolic CHF (congestive heart failure) (HCC)    ischemic CM // s/p MI in 11/2017 >> Echo 6/19:  Moderate concentric LVH, EF 40-45, grade 1 diastolic dysfunction, inferior, inferolateral, apical inferior akinesis   . Chronic lower back pain   . CKD (chronic kidney disease), stage III (HCC)   . Constipation    takes an OTC med as needed  . Diabetic nephropathy (HCC)   . Gastric ulcer 1970s  . Glaucoma, both eyes    uses eye drops daily  . Gout    takes Allopurinol daily  . History of colon polyps   . History of ST elevation myocardial infarction (STEMI) 11/10/2017   PCI to LCx and staged PCI to RCA  . History of stress test    a. 09/2014 MV: EF 53%, no ischemia/infarct.  . Hyperlipidemia    takes Pravastatin daily  . Hypertension    takes Amlodipine  and Losartan daily  . Insomnia    uses OTC sleep aide  . Joint pain   . Joint swelling    right knee   . Legally blind   . Macular degeneration   . Morbid obesity (HCC)   . Nocturia   . Peripheral edema   . Pleurisy 20+yrs ago  . Pneumonia    "I think once when I was a kid" (11/12/2017)  . Sleep apnea   . Stroke [facility])    "I've had them; don't know anything about them"; denies residual on 11/12/2017)  . Syncope    a. 09/2014 in setting of orthostasis; b. 10/2014 Event monitor: 19 beat run of asymp NSVT; c. 01/2015 Echo: EF 60-65%, no rwma.  . Type II diabetes mellitus (HCC)    takes Metformin daily  . Urinary frequency    takes Flomax daily   Patient Active Problem List   Diagnosis Date Noted  . Chest pain 12/08/2017  . Ischemic cardiomyopathy 11/28/2017  . Neurocognitive deficits 11/19/2017  . Bilious vomiting   . Ileus, unspecified (HCC) 11/13/2017  . Sleep apnea 11/13/2017  . Obesity 11/13/2017  . Stage III chronic kidney disease (HCC) 11/11/2017  . Acute on chronic combined systolic and diastolic CHF (congestive heart failure) (HCC) 11/11/2017  . CAD (coronary artery disease) 11/11/2017  . Acute ST elevation myocardial infarction (STEMI) of inferolateral wall (HCC) 11/10/2017  .  ST elevation myocardial infarction (STEMI) (HCC)   . AKI (acute kidney injury) (HCC) 01/22/2015  . Lactic acidosis 01/22/2015  . Syncope and collapse   . HTN (hypertension), benign   . Encephalopathy 09/07/2014  . Osteoarthrosis, unspecified whether generalized or localized, shoulder region 09/21/2013  . Gout 12/23/2012  . Hypotension 12/21/2012  . Type 2 diabetes mellitus with complication, without long-term current use of insulin (HCC) 12/21/2012  . Hyperlipidemia   . Morbid obesity (HCC)    Home Medication(s) Prior to Admission medications   Medication Sig Start Date End Date Taking? Authorizing Provider  aspirin EC 81 MG EC tablet Take 1 tablet (81 mg total) by mouth daily. 11/19/17    Barrett, Joline Salt, PA-C  atorvastatin (LIPITOR) 80 MG tablet Take 1 tablet (80 mg total) by mouth daily. 12/09/17   Robbie Lis M, PA-C  furosemide (LASIX) 20 MG tablet Take 1 tablet (20 mg total) by mouth daily. 11/29/17   Medina-Vargas, Monina C, NP  loperamide (IMODIUM) 2 MG capsule Take 2 mg by mouth as needed for diarrhea or loose stools.    [provider]  magnesium hydroxide (MILK OF MAGNESIA) 400 MG/5ML suspension Take 30 mLs by mouth daily as needed for mild constipation.    [provider]  Menthol-Camphor (ICY HOT ADVANCED RELIEF) 16-11 % CREA Apply 1 application topically as needed (knee pain).    [provider]  metoprolol succinate (TOPROL-XL) 25 MG 24 hr tablet Take 1 tablet (25 mg total) by mouth daily. 12/09/17   Robbie Lis M, PA-C  nitroGLYCERIN (NITROSTAT) 0.4 MG SL tablet Place 1 tablet (0.4 mg total) under the tongue every 5 (five) minutes x 3 doses as needed for chest pain. 12/09/17   Robbie Lis M, PA-C  prednisoLONE acetate (PRED FORTE) 1 % ophthalmic suspension Place 1 drop into the right eye at bedtime. 11/29/17   Medina-Vargas, Monina C, NP  sertraline (ZOLOFT) 50 MG tablet Take 1 tablet (50 mg total) by mouth at bedtime. 11/29/17   Medina-Vargas, Monina C, NP  tamsulosin (FLOMAX) 0.4 MG CAPS capsule Take 1 capsule (0.4 mg total) by mouth daily. 11/29/17   Medina-Vargas, Monina C, NP  ticagrelor (BRILINTA) 90 MG TABS tablet Take 1 tablet (90 mg total) by mouth 2 (two) times daily. 12/09/17   Robbie Lis M, PA-C  timolol (TIMOPTIC) 0.5 % ophthalmic solution Place 1 drop into the right eye daily. 11/29/17   Medina-Vargas, Monina C, NP  TRAVATAN Z 0.004 % SOLN ophthalmic solution Place 1 drop into the left eye at bedtime. 11/29/17   Medina-Vargas, Margit Banda, NP                                                                                                                                    Past Surgical History Past Surgical History:    Procedure Laterality Date  . BACK SURGERY    . CATARACT EXTRACTION W/ INTRAOCULAR LENS  IMPLANT, BILATERAL Bilateral   . COLONOSCOPY    . CORONARY ANGIOGRAPHY N/A 11/12/2017   Procedure: CORONARY ANGIOGRAPHY;  Surgeon: Lennette Bihari, MD;  Location: [facility] INVASIVE CV LAB;  Service: Cardiovascular;  Laterality: N/A;  . CORONARY STENT INTERVENTION N/A 11/12/2017   Procedure: CORONARY STENT INTERVENTION;  Surgeon: Lennette Bihari, MD;  Location: [facility] INVASIVE CV LAB;  Service: Cardiovascular;  Laterality: N/A;  . CORONARY/GRAFT ACUTE MI REVASCULARIZATION N/A 11/10/2017   Procedure: Coronary/Graft Acute MI Revascularization;  Surgeon: Lennette Bihari, MD;  Location: [facility] INVASIVE CV LAB;  Service: Cardiovascular;  Laterality: N/A;  . EYE SURGERY    . GLAUCOMA SURGERY Bilateral   . INSERTION / PLACEMENT / REVISION NEUROSTIMULATOR  2000s  . KNEE ARTHROSCOPY Right 1990s  . LEFT HEART CATH AND CORONARY ANGIOGRAPHY N/A 11/10/2017   Procedure: LEFT HEART CATH AND CORONARY ANGIOGRAPHY;  Surgeon: Lennette Bihari, MD;  Location: [facility] INVASIVE CV LAB;  Service: Cardiovascular;  Laterality: N/A;  . MINI SHUNT INSERTION  09/26/2011   Procedure: INSERTION OF MINI SHUNT;  Surgeon: Chalmers Guest, MD;  Location[facility] OR;  Service: Ophthalmology;  Laterality: Right;  Insertion of Ahmed shunt  . POSTERIOR FUSION LUMBAR SPINE  1990s  . REVERSE SHOULDER ARTHROPLASTY Right 09/21/2013   Procedure: RIGHT  SHOULDER ARTHROPLASTY REVERSE ;  Surgeon: [name], MD;  Location: [facility] OR;  Service: Orthopedics;  Laterality: Right;  . SHOULDER INJECTION Left 09/21/2013   Procedure: SHOULDER INJECTION;  Surgeon: [name], MD;  Location[facility] OR;  Service: Orthopedics;  Laterality: Left;   Family History Family History  Problem Relation Age of Onset  . Anesthesia problems Neg Hx     Social History Social History   Tobacco Use  . Smoking status: Former Smoker    Packs/day: 2.00    Types: Cigarettes  . Smokeless tobacco: Never  Used  . Tobacco comment: 11/12/2017 "nothing since the 1980s"  Substance Use Topics  . Alcohol use: Not Currently    Comment: 11/12/2017 "nothing since the 1980s"  . Drug use: Never   Allergies Patient has no known allergies.  Review of Systems Review of Systems  Constitutional: Positive for diaphoresis. Negative for fever.  Cardiovascular: Positive for syncope. Negative for chest pain.  Gastrointestinal: Positive for nausea. Negative for vomiting.  Genitourinary: Negative for bladder incontinence.  Neurological: Positive for light-headedness. Negative for dizziness.   All other systems are reviewed and are negative for acute change except as noted in the HPI  Physical Exam Vital Signs  I have reviewed the triage vital signs BP 92/68   Pulse 78   Temp 97.7 F (36.5 C) (Oral)   Resp 20   Ht 5\' 10"  (1.778 m)   Wt 108.9 kg (240 lb)   SpO2 100%   BMI 34.44 kg/m   Physical Exam  Constitutional: He is oriented to person, place, and time. He appears well-developed and well-nourished. No distress.  HENT:  Head: Normocephalic and atraumatic.  Right Ear: External ear normal.  Left Ear: External ear normal.  Nose: Nose normal.  Mouth/Throat: Oropharynx is clear and moist.  Eyes: Pupils are equal, round, and reactive to light. Conjunctivae and EOM are normal. Right eye exhibits no discharge. Left eye exhibits no discharge. No scleral icterus.  Right eye closed. Pt can only see light and shadow with left eye.  Neck: Normal range of motion. Neck supple.  Cardiovascular: Normal rate, regular rhythm and normal heart sounds. Exam reveals no gallop and no friction rub.  No murmur heard. Pulses:      Radial pulses are 2+ on the right side, and 2+ on the left side.       Dorsalis pedis pulses are 2+ on the right side, and 2+ on the left side.  Pulmonary/Chest: Effort normal and breath sounds normal. No stridor. No respiratory distress. He has no rales.  Abdominal: Soft. He exhibits no  distension. There is no tenderness.  Musculoskeletal: He exhibits no edema or tenderness.       Cervical back: He exhibits no bony tenderness.       Thoracic back: He exhibits no bony tenderness.       Lumbar back: He exhibits no bony tenderness.  Clavicle stable. Chest stable to AP/Lat compression. Pelvis stable to Lat compression. No obvious extremity deformity. No chest or abdominal wall contusion.  Neurological: He is alert and oriented to person, place, and time. GCS eye subscore is 4. GCS verbal subscore is 5. GCS motor subscore is 6.  Moving all extremities   Skin: Skin is warm and dry. No rash noted. He is not diaphoretic. No erythema.  Psychiatric: He has a normal mood and affect.  Vitals reviewed.   ED Results and Treatments Labs (all labs ordered are listed, but only abnormal results are displayed) Labs Reviewed  COMPREHENSIVE METABOLIC PANEL - Abnormal; Notable for the following components:      Result Value   Glucose, Bld 141 (*)    BUN 30 (*)    Creatinine, Ser 2.04 (*)    GFR calc non Af Amer 28 (*)    GFR calc Af Amer 33 (*)    All other components within normal limits  GASTROINTESTINAL PANEL BY PCR, STOOL (REPLACES STOOL CULTURE)  C DIFFICILE QUICK SCREEN W PCR REFLEX  CBC WITH DIFFERENTIAL/PLATELET  POC OCCULT BLOOD, ED  CBG MONITORING, ED                                                                                                                         EKG  EKG Interpretation  Date/Time:  Monday December 16 2017 03:19:10 EDT Ventricular Rate:  74 PR Interval:    QRS Duration: 155 QT Interval:  464 QTC Calculation: 515 R Axis:   -94 Text Interpretation:  Sinus rhythm Prolonged PR interval Right bundle branch block No significant change since last tracing Confirmed by Drema Pry [phone]) on 12/16/2017 5:14:10 AM      Radiology Ct Head Wo Contrast  Result Date: 12/16/2017 CLINICAL DATA:  84 year old male with head trauma. EXAM: CT HEAD WITHOUT  CONTRAST TECHNIQUE: Contiguous axial images were obtained from the base of the skull through the vertex without intravenous contrast. COMPARISON:  Head CT dated 01/22/2015 FINDINGS: Brain: There is mild age-related atrophy and chronic microvascular ischemic changes. There is no acute intracranial hemorrhage. No mass effect or midline shift. No extra-axial fluid collection. Vascular: No hyperdense vessel or unexpected calcification. Skull: Normal. Negative for fracture or focal lesion. Sinuses/Orbits: No acute  finding. Other: None IMPRESSION: 1. No acute intracranial hemorrhage. 2. Age-related atrophy and chronic microvascular ischemic changes. Electronically Signed   By: Elgie Collard M.D.   On: 12/16/2017 05:21   Pertinent labs & imaging results that were available during my care of the patient were reviewed by me and considered in my medical decision making (see chart for details).  Medications Ordered in ED Medications  sodium chloride 0.9 % bolus 500 mL (500 mLs Intravenous Bolus from Bag 12/16/17 0515)                                                                                                                                    Procedures Procedures  (including critical care time)  Medical Decision Making / ED Course I have reviewed the nursing notes for this encounter and the patient's prior records (if available in EHR or on provided paperwork).    EKG without acute ischemic changes, dysrhythmias or new blocks.  Patient noted to have soft blood pressures with systolics in the 90s.  Given large amount of diarrhea, likely dehydration.  Labs confirming mild AKI likely from dehydration.  Rest of the work-up reassuring without anemia or significant electrolyte derangements.  Abdomen benign. Provided with IV fluids, stabilizing blood pressures.  Orthostatics reassuring.  Stool sample obtained and sent for analysis.  Will await results and contact patient if needed.  The patient appears  reasonably screened and/or stabilized for discharge and I doubt any other medical condition or other [facility] requiring further screening, evaluation, or treatment in the ED at this time prior to discharge.  The patient is safe for discharge with strict return precautions.   Final Clinical Impression(s) / ED Diagnoses Final diagnoses:  Syncope and collapse  Dehydration  Diarrhea, unspecified type    Disposition: Discharge  Condition: Good  I have discussed the results, Dx and Tx plan with the patient who expressed understanding and agree(s) with the plan. Discharge instructions discussed at great length. The patient was given strict return precautions who verbalized understanding of the instructions. No further questions at time of discharge.    ED Discharge Orders    None       Follow Up: Rankins, Fanny Dance, MD [address] [place] [address] [phone]  Today As scheduled     This chart was dictated using voice recognition software.  Despite best efforts to proofread,  errors can occur which can change the documentation meaning.   Nira Conn, MD 12/16/17 [phone]

## 2017-12-16 NOTE — ED Triage Notes (Signed)
Per [facility], pt coming from home with complaints of a syncopal episode after pt got off toilet. Pt feel and hit back of head, no active bleeding, pt is on thinners. Pt stated he has had diarrhea for 3 days now and can't keep anything down. Pt was hypotensive, pale and diaphoretic for EMS. Pt received NS bolus. Pt  Currently a&ox4.

## 2017-12-16 NOTE — ED Notes (Signed)
Dr. Ilda Basset stated to withdraw urinalysis sent at 0523. Pt stated may have had some stool in urine. Lab called and stated urine has already resulted,pt will be credited. Urine to be recollected.

## 2017-12-18 ENCOUNTER — Telehealth (HOSPITAL_COMMUNITY): Payer: Self-pay

## 2017-12-18 NOTE — Telephone Encounter (Signed)
Attempted to contact patient to see if he is interested in the Cardiac Rehab Program - lm on vm. Follow up appt has been rescheduled to 01/07/18.

## 2017-12-19 ENCOUNTER — Emergency Department (HOSPITAL_COMMUNITY): Payer: Medicare HMO

## 2017-12-19 ENCOUNTER — Encounter (HOSPITAL_COMMUNITY): Payer: Self-pay

## 2017-12-19 ENCOUNTER — Other Ambulatory Visit: Payer: Self-pay

## 2017-12-19 ENCOUNTER — Emergency Department (HOSPITAL_COMMUNITY)
Admission: EM | Admit: 2017-12-19 | Discharge: 2017-12-19 | Disposition: A | Discharge status: Home / Self Care | Payer: Medicare HMO | Attending: Emergency Medicine | Admitting: Emergency Medicine

## 2017-12-19 DIAGNOSIS — Z87891 Personal history of nicotine dependence: Secondary | ICD-10-CM | POA: Diagnosis not present

## 2017-12-19 DIAGNOSIS — Z96611 Presence of right artificial shoulder joint: Secondary | ICD-10-CM | POA: Diagnosis not present

## 2017-12-19 DIAGNOSIS — Z79899 Other long term (current) drug therapy: Secondary | ICD-10-CM | POA: Insufficient documentation

## 2017-12-19 DIAGNOSIS — N183 Chronic kidney disease, stage 3 (moderate): Secondary | ICD-10-CM | POA: Insufficient documentation

## 2017-12-19 DIAGNOSIS — I5043 Acute on chronic combined systolic (congestive) and diastolic (congestive) heart failure: Secondary | ICD-10-CM | POA: Diagnosis not present

## 2017-12-19 DIAGNOSIS — M25561 Pain in right knee: Secondary | ICD-10-CM | POA: Insufficient documentation

## 2017-12-19 DIAGNOSIS — I2510 Atherosclerotic heart disease of native coronary artery without angina pectoris: Secondary | ICD-10-CM | POA: Diagnosis not present

## 2017-12-19 DIAGNOSIS — Z7982 Long term (current) use of aspirin: Secondary | ICD-10-CM | POA: Diagnosis not present

## 2017-12-19 DIAGNOSIS — I130 Hypertensive heart and chronic kidney disease with heart failure and stage 1 through stage 4 chronic kidney disease, or unspecified chronic kidney disease: Secondary | ICD-10-CM | POA: Insufficient documentation

## 2017-12-19 DIAGNOSIS — E1122 Type 2 diabetes mellitus with diabetic chronic kidney disease: Secondary | ICD-10-CM | POA: Diagnosis not present

## 2017-12-19 LAB — BASIC METABOLIC PANEL
Anion gap: 5 (ref 5–15)
BUN: 24 mg/dL — ABNORMAL HIGH (ref 8–23)
CO2: 28 mmol/L (ref 22–32)
Calcium: 9.4 mg/dL (ref 8.9–10.3)
Chloride: 110 mmol/L (ref 98–111)
Creatinine, Ser: 1.51 mg/dL — ABNORMAL HIGH (ref 0.61–1.24)
GFR calc Af Amer: 47 mL/min — ABNORMAL LOW (ref >60)
GFR calc non Af Amer: 41 mL/min — ABNORMAL LOW (ref >60)
Glucose, Bld: 128 mg/dL — ABNORMAL HIGH (ref 70–99)
Potassium: 4 mmol/L (ref 3.5–5.1)
Sodium: 143 mmol/L (ref 135–145)

## 2017-12-19 LAB — CBC
HCT: 35.1 % — ABNORMAL LOW (ref 39.0–52.0)
Hemoglobin: 11.5 g/dL — ABNORMAL LOW (ref 13.0–17.0)
MCH: 29.1 pg (ref 26.0–34.0)
MCHC: 32.8 g/dL (ref 30.0–36.0)
MCV: 88.9 fL (ref 78.0–100.0)
Platelets: 248 10*3/uL (ref 150–400)
RBC: 3.95 MIL/uL — ABNORMAL LOW (ref 4.22–5.81)
RDW: 14.6 % (ref 11.5–15.5)
WBC: 7.5 10*3/uL (ref 4.0–10.5)

## 2017-12-19 MED ORDER — SODIUM CHLORIDE 0.9 % IV BOLUS
1000.0000 mL | Freq: Once | INTRAVENOUS | Status: AC
  Administered 2017-12-19: 1000 mL via INTRAVENOUS

## 2017-12-19 MED ORDER — HYDROCODONE-ACETAMINOPHEN 5-325 MG PO TABS: 1.0000 | ORAL_TABLET | ORAL | 0 refills | Status: DC | PRN

## 2017-12-19 MED ORDER — MORPHINE SULFATE (PF) 4 MG/ML IV SOLN
4.0000 mg | Freq: Once | INTRAVENOUS | Status: AC
  Administered 2017-12-19: 4 mg via INTRAVENOUS
  Filled 2017-12-19: qty 1

## 2017-12-19 NOTE — ED Provider Notes (Signed)
[facility]-EMERGENCY DEPT Provider Note   CSN: [id number] Arrival date & time: 12/19/17  1308     History   Chief Complaint Chief Complaint  Patient presents with  . right knee pain and swelling    HPI [name] is a 84 y.o. male.  HPI Patient is a 84 year old male reports tripping and falling 6 days ago.  He is been laying around the house continues to have right knee pain.  His pain persisted thus he came to the ER for evaluation of his right knee pain today.  He walks at home with a walker.  He has had limited mobility secondary to pain.  He presents with significant swelling around the right knee itself.  No fevers or chills.  No urinary symptoms.  No chest pain shortness breath.  Denies back pain.  No head injury.  No neck pain.  No weakness of his arms or legs.  Pain is moderate in severity and worse with range of motion and palpation of his right knee.   Past Medical History:  Diagnosis Date  . Arthritis    "all over" (11/12/2017)  . CAD (coronary artery disease) 11/11/2017   S/p IL STEMI 6/19:  S/p DES to pLCx and DES x 2 to OM2 // staged PCI 6/19:  DES x 2 to RCA // residual CAD at cath 6/19:  mLAD 80, dLAD 90, D1 95 tx medically // EF 40-45 by echo  . Chronic combined systolic and diastolic CHF (congestive heart failure) (HCC)    ischemic CM // s/p MI in 11/2017 >> Echo 6/19:  Moderate concentric LVH, EF 40-45, grade 1 diastolic dysfunction, inferior, inferolateral, apical inferior akinesis   . Chronic lower back pain   . CKD (chronic kidney disease), stage III (HCC)   . Constipation    takes an OTC med as needed  . Diabetic nephropathy (HCC)   . Gastric ulcer 1970s  . Glaucoma, both eyes    uses eye drops daily  . Gout    takes Allopurinol daily  . History of colon polyps   . History of ST elevation myocardial infarction (STEMI) 11/10/2017   PCI to LCx and staged PCI to RCA  . History of stress test    a. 09/2014 MV: EF 53%, no  ischemia/infarct.  . Hyperlipidemia    takes Pravastatin daily  . Hypertension    takes Amlodipine and Losartan daily  . Insomnia    uses OTC sleep aide  . Joint pain   . Joint swelling    right knee   . Legally blind   . Macular degeneration   . Morbid obesity (HCC)   . Nocturia   . Peripheral edema   . Pleurisy 20+yrs ago  . Pneumonia    "I think once when I was a kid" (11/12/2017)  . Sleep apnea   . Stroke [facility])    "I've had them; don't know anything about them"; denies residual on 11/12/2017)  . Syncope    a. 09/2014 in setting of orthostasis; b. 10/2014 Event monitor: 19 beat run of asymp NSVT; c. 01/2015 Echo: EF 60-65%, no rwma.  . Type II diabetes mellitus (HCC)    takes Metformin daily  . Urinary frequency    takes Flomax daily    Patient Active Problem List   Diagnosis Date Noted  . Chest pain 12/08/2017  . Ischemic cardiomyopathy 11/28/2017  . Neurocognitive deficits 11/19/2017  . Bilious vomiting   . Ileus, unspecified (HCC) 11/13/2017  .  Sleep apnea 11/13/2017  . Obesity 11/13/2017  . Stage III chronic kidney disease (HCC) 11/11/2017  . Acute on chronic combined systolic and diastolic CHF (congestive heart failure) (HCC) 11/11/2017  . CAD (coronary artery disease) 11/11/2017  . Acute ST elevation myocardial infarction (STEMI) of inferolateral wall (HCC) 11/10/2017  . ST elevation myocardial infarction (STEMI) (HCC)   . AKI (acute kidney injury) (HCC) 01/22/2015  . Lactic acidosis 01/22/2015  . Syncope and collapse   . HTN (hypertension), benign   . Encephalopathy 09/07/2014  . Osteoarthrosis, unspecified whether generalized or localized, shoulder region 09/21/2013  . Gout 12/23/2012  . Hypotension 12/21/2012  . Type 2 diabetes mellitus with complication, without long-term current use of insulin (HCC) 12/21/2012  . Hyperlipidemia   . Morbid obesity (HCC)     Past Surgical History:  Procedure Laterality Date  . BACK SURGERY    . CATARACT EXTRACTION W/  INTRAOCULAR LENS  IMPLANT, BILATERAL Bilateral   . COLONOSCOPY    . CORONARY ANGIOGRAPHY N/A 11/12/2017   Procedure: CORONARY ANGIOGRAPHY;  Surgeon: Lennette Bihari, MD;  Location: [facility] INVASIVE CV LAB;  Service: Cardiovascular;  Laterality: N/A;  . CORONARY STENT INTERVENTION N/A 11/12/2017   Procedure: CORONARY STENT INTERVENTION;  Surgeon: Lennette Bihari, MD;  Location: [facility] INVASIVE CV LAB;  Service: Cardiovascular;  Laterality: N/A;  . CORONARY/GRAFT ACUTE MI REVASCULARIZATION N/A 11/10/2017   Procedure: Coronary/Graft Acute MI Revascularization;  Surgeon: Lennette Bihari, MD;  Location: [facility] INVASIVE CV LAB;  Service: Cardiovascular;  Laterality: N/A;  . EYE SURGERY    . GLAUCOMA SURGERY Bilateral   . INSERTION / PLACEMENT / REVISION NEUROSTIMULATOR  2000s  . KNEE ARTHROSCOPY Right 1990s  . LEFT HEART CATH AND CORONARY ANGIOGRAPHY N/A 11/10/2017   Procedure: LEFT HEART CATH AND CORONARY ANGIOGRAPHY;  Surgeon: Lennette Bihari, MD;  Location: [facility] INVASIVE CV LAB;  Service: Cardiovascular;  Laterality: N/A;  . MINI SHUNT INSERTION  09/26/2011   Procedure: INSERTION OF MINI SHUNT;  Surgeon: Chalmers Guest, MD;  Location: [facility] OR;  Service: Ophthalmology;  Laterality: Right;  Insertion of Ahmed shunt  . POSTERIOR FUSION LUMBAR SPINE  1990s  . REVERSE SHOULDER ARTHROPLASTY Right 09/21/2013   Procedure: RIGHT  SHOULDER ARTHROPLASTY REVERSE ;  Surgeon: [name], MD;  Location: [facility] OR;  Service: Orthopedics;  Laterality: Right;  . SHOULDER INJECTION Left 09/21/2013   Procedure: SHOULDER INJECTION;  Surgeon: [name], MD;  Location: [facility] OR;  Service: Orthopedics;  Laterality: Left;        Home Medications    Prior to Admission medications   Medication Sig Start Date End Date Taking? Authorizing Provider  aspirin EC 81 MG EC tablet Take 1 tablet (81 mg total) by mouth daily. 11/19/17   Barrett, Joline Salt, PA-C  atorvastatin (LIPITOR) 80 MG tablet Take 1 tablet (80 mg total) by mouth daily. 12/09/17    Robbie Lis M, PA-C  furosemide (LASIX) 20 MG tablet Take 1 tablet (20 mg total) by mouth daily. 11/29/17   Medina-Vargas, Monina C, NP  HYDROcodone-acetaminophen (NORCO/VICODIN) 5-325 MG tablet Take 1 tablet by mouth every 4 (four) hours as needed for moderate pain. 12/19/17   Azalia Bilis, MD  loperamide (IMODIUM) 2 MG capsule Take 2 mg by mouth as needed for diarrhea or loose stools.    [provider]  magnesium hydroxide (MILK OF MAGNESIA) 400 MG/5ML suspension Take 30 mLs by mouth daily as needed for mild constipation.    [provider]  Menthol-Camphor (  ICY HOT ADVANCED RELIEF) 16-11 % CREA Apply 1 application topically as needed (knee pain).    [provider]  metoprolol succinate (TOPROL-XL) 25 MG 24 hr tablet Take 1 tablet (25 mg total) by mouth daily. 12/09/17   Robbie Lis M, PA-C  nitroGLYCERIN (NITROSTAT) 0.4 MG SL tablet Place 1 tablet (0.4 mg total) under the tongue every 5 (five) minutes x 3 doses as needed for chest pain. 12/09/17   Robbie Lis M, PA-C  prednisoLONE acetate (PRED FORTE) 1 % ophthalmic suspension Place 1 drop into the right eye at bedtime. 11/29/17   Medina-Vargas, Monina C, NP  sertraline (ZOLOFT) 50 MG tablet Take 1 tablet (50 mg total) by mouth at bedtime. 11/29/17   Medina-Vargas, Monina C, NP  tamsulosin (FLOMAX) 0.4 MG CAPS capsule Take 1 capsule (0.4 mg total) by mouth daily. 11/29/17   Medina-Vargas, Monina C, NP  ticagrelor (BRILINTA) 90 MG TABS tablet Take 1 tablet (90 mg total) by mouth 2 (two) times daily. 12/09/17   Robbie Lis M, PA-C  timolol (TIMOPTIC) 0.5 % ophthalmic solution Place 1 drop into the right eye daily. 11/29/17   Medina-Vargas, Monina C, NP  TRAVATAN Z 0.004 % SOLN ophthalmic solution Place 1 drop into the left eye at bedtime. 11/29/17   Medina-Vargas, Margit Banda, NP    Family History Family History  Problem Relation Age of Onset  . Anesthesia problems Neg Hx     Social History Social  History   Tobacco Use  . Smoking status: Former Smoker    Packs/day: 2.00    Types: Cigarettes  . Smokeless tobacco: Never Used  . Tobacco comment: 11/12/2017 "nothing since the 1980s"  Substance Use Topics  . Alcohol use: Not Currently    Comment: 11/12/2017 "nothing since the 1980s"  . Drug use: Never     Allergies   Patient has no known allergies.   Review of Systems Review of Systems  All other systems reviewed and are negative.    Physical Exam Updated Vital Signs BP 139/81 (BP Location: Left Arm)   Pulse 77   Temp 98.7 F (37.1 C) (Oral)   Resp 18   Ht 5\' 10"  (1.778 m)   Wt 108.9 kg (240 lb)   SpO2 97%   BMI 34.44 kg/m   Physical Exam  Constitutional: He is oriented to person, place, and time. He appears well-developed and well-nourished.  HENT:  Head: Normocephalic and atraumatic.  Eyes: EOM are normal.  Neck: Normal range of motion.  Cardiovascular: Normal rate, regular rhythm, normal heart sounds and intact distal pulses.  Pulmonary/Chest: Effort normal and breath sounds normal. No respiratory distress.  Abdominal: Soft. He exhibits no distension. There is no tenderness.  Musculoskeletal: Normal range of motion.  Moderate sized joint effusion of the right knee.  Normal pulses in the right foot.  Painful range of motion of the right knee without obvious instability.  Neurological: He is alert and oriented to person, place, and time.  Skin: Skin is warm and dry.  Psychiatric: He has a normal mood and affect. Judgment normal.  Nursing note and vitals reviewed.    ED Treatments / Results  Labs (all labs ordered are listed, but only abnormal results are displayed) Labs Reviewed  CBC - Abnormal; Notable for the following components:      Result Value   RBC 3.95 (*)    Hemoglobin 11.5 (*)    HCT 35.1 (*)    All other components within normal limits  BASIC  METABOLIC PANEL - Abnormal; Notable for the following components:   Glucose, Bld 128 (*)    BUN  24 (*)    Creatinine, Ser 1.51 (*)    GFR calc non Af Amer 41 (*)    GFR calc Af Amer 47 (*)    All other components within normal limits    EKG None  Radiology Dg Knee Complete 4 Views Right  Result Date: 12/19/2017 CLINICAL DATA:  Pain and swelling in the right knee, fell on Sunday EXAM: RIGHT KNEE - COMPLETE 4+ VIEW COMPARISON:  None FINDINGS: There is tricompartmental degenerative joint disease of the right knee primarily involving the medial compartment where there is considerable loss of joint space. There is spurring from each compartment. There is some chondrocalcinosis present which may indicate CPPD arthropathy. A moderate size right knee joint effusion is noted. There is also some soft tissue swelling anterior to the patella. IMPRESSION: 1. Tricompartmental degenerative joint disease the right knee primarily involving the medial compartment. 2. Moderate size right knee joint effusion.  No fracture. Electronically Signed   By: Paul  Barry M.D.   On: 12/19/2017 15:22    Procedures Procedures (including critical care time)  Medications Ordered in ED Medications  sodium chloride 0.9 % bolus 1,000 mL (1,000 mLs Intravenous New Bag/Given 12/19/17 1456)  morphine 4 MG/ML injection 4 mg (4 mg Intravenous Given 12/19/17 1456)     Initial Impression / Assessment and Plan / ED Course  I have reviewed the triage vital signs and the nursing notes.  Pertinent labs & imaging results that were available during my care of the patient were reviewed by me and considered in my medical decision making (see chart for details).     No osseous injury found on plain film.  Given the moderate size knee effusion this may represent internal derangement.  Home with a knee immobilizer.  Primary care and orthopedic follow-up.  If his symptoms persist he may benefit from MRI.  He will need orthopedic evaluation.  No erythema or warmth of the joint effusion to suggest septic arthritis.  This is a traumatic  effusion.  Patient encouraged to return the emergency department for new or worsening symptoms.  Home with a short course of pain medication.  Final Clinical Impressions(s) / ED Diagnoses   Final diagnoses:  Acute pain of right knee    ED Discharge Orders        Ordered    HYDROcodone-acetaminophen (NORCO/VICODIN) 5-325 MG tablet  Every 4 hours PRN     07 /18/19 1604       Azalia Bilis, MD 12/19/17 1615

## 2017-12-19 NOTE — ED Notes (Signed)
Bed: WA07 Expected date:  Expected time:  Means of arrival:  Comments: EMS-fall 

## 2017-12-19 NOTE — ED Triage Notes (Signed)
Arrived via [facility] from home for pain and swelling in right knee. Fall happened Sunday and was seen at [facility] and was told no injuries.Right knee obviously swollen.

## 2017-12-24 ENCOUNTER — Ambulatory Visit: Payer: Medicare HMO | Admitting: Physician Assistant

## 2017-12-29 ENCOUNTER — Other Ambulatory Visit: Payer: Self-pay | Admitting: Adult Health

## 2017-12-29 DIAGNOSIS — N400 Enlarged prostate without lower urinary tract symptoms: Secondary | ICD-10-CM

## 2018-01-06 NOTE — Progress Notes (Deleted)
Cardiology Office Note    Date:  01/06/2018   ID:  [name], DOB May 15, 1931, MRN [id number]  PCP:  Clayborn Heron, MD  Cardiologist: Thurmon Fair, MD  No chief complaint on file.   History of Present Illness:  DAVAN Duffy is a 84 y.o. male with history of CAD status post STEMI treated with DES x2 to the OM 2 and DES to the proximal circumflex.  He then underwent staged DES x2 to the RCA and has residual LAD disease with 70% mid, 85% distal and 95% small diagonal 1.  2D echo LVEF 40 to 45% with mild diastolic dysfunction.  Hospitalization was complicated by fluid fluid overload and ileus requiring NG tube placement.  Was sent to a skilled nursing facility.  Back in the ER with chest pain 12/08/2017.  Troponins were negative x3.  He is legally blind and was not sure he was taking the Brilinta so he was reloaded with Brilinta.  Chest pain spontaneously resolved.  Patient back in the ER 12/16/2017 with syncope in the setting of diarrhea and dehydration treated with IV fluids  Past Medical History:  Diagnosis Date  . Arthritis    "all over" (11/12/2017)  . CAD (coronary artery disease) 11/11/2017   S/p IL STEMI 6/19:  S/p DES to pLCx and DES x 2 to OM2 // staged PCI 6/19:  DES x 2 to RCA // residual CAD at cath 6/19:  mLAD 80, dLAD 90, D1 95 tx medically // EF 40-45 by echo  . Chronic combined systolic and diastolic CHF (congestive heart failure) (HCC)    ischemic CM // s/p MI in 11/2017 >> Echo 6/19:  Moderate concentric LVH, EF 40-45, grade 1 diastolic dysfunction, inferior, inferolateral, apical inferior akinesis   . Chronic lower back pain   . CKD (chronic kidney disease), stage III (HCC)   . Constipation    takes an OTC med as needed  . Diabetic nephropathy (HCC)   . Gastric ulcer 1970s  . Glaucoma, both eyes    uses eye drops daily  . Gout    takes Allopurinol daily  . History of colon polyps   . History of ST elevation myocardial infarction (STEMI) 11/10/2017   PCI to LCx and staged PCI to RCA  . History of stress test    a. 09/2014 MV: EF 53%, no ischemia/infarct.  . Hyperlipidemia    takes Pravastatin daily  . Hypertension    takes Amlodipine and Losartan daily  . Insomnia    uses OTC sleep aide  . Joint pain   . Joint swelling    right knee   . Legally blind   . Macular degeneration   . Morbid obesity (HCC)   . Nocturia   . Peripheral edema   . Pleurisy 20+yrs ago  . Pneumonia    "I think once when I was a kid" (11/12/2017)  . Sleep apnea   . Stroke [facility])    "I've had them; don't know anything about them"; denies residual on 11/12/2017)  . Syncope    a. 09/2014 in setting of orthostasis; b. 10/2014 Event monitor: 19 beat run of asymp NSVT; c. 01/2015 Echo: EF 60-65%, no rwma.  . Type II diabetes mellitus (HCC)    takes Metformin daily  . Urinary frequency    takes Flomax daily    Past Surgical History:  Procedure Laterality Date  . BACK SURGERY    . CATARACT EXTRACTION W/ INTRAOCULAR LENS  IMPLANT, BILATERAL Bilateral   .  COLONOSCOPY    . CORONARY ANGIOGRAPHY N/A 11/12/2017   Procedure: CORONARY ANGIOGRAPHY;  Surgeon: Lennette Bihari, MD;  [facility] INVASIVE CV LAB;  Service: Cardiovascular;  Laterality: N/A;  . CORONARY STENT INTERVENTION N/A 11/12/2017   Procedure: CORONARY STENT INTERVENTION;  Surgeon: Lennette Bihari, MD;  Location: [facility] INVASIVE CV LAB;  Service: Cardiovascular;  Laterality: N/A;  . CORONARY/GRAFT ACUTE MI REVASCULARIZATION N/A 11/10/2017   Procedure: Coronary/Graft Acute MI Revascularization;  Surgeon: Lennette Bihari, MD;  Location: [facility] INVASIVE CV LAB;  Service: Cardiovascular;  Laterality: N/A;  . EYE SURGERY    . GLAUCOMA SURGERY Bilateral   . INSERTION / PLACEMENT / REVISION NEUROSTIMULATOR  2000s  . KNEE ARTHROSCOPY Right 1990s  . LEFT HEART CATH AND CORONARY ANGIOGRAPHY N/A 11/10/2017   Procedure: LEFT HEART CATH AND CORONARY ANGIOGRAPHY;  Surgeon: Lennette Bihari, MD;  Location: [facility] INVASIVE CV LAB;   Service: Cardiovascular;  Laterality: N/A;  . MINI SHUNT INSERTION  09/26/2011   Procedure: INSERTION OF MINI SHUNT;  Surgeon: Chalmers Guest, MD;  Location: [facility] OR;  Service: Ophthalmology;  Laterality: Right;  Insertion of Ahmed shunt  . POSTERIOR FUSION LUMBAR SPINE  1990s  . REVERSE SHOULDER ARTHROPLASTY Right 09/21/2013   Procedure: RIGHT  SHOULDER ARTHROPLASTY REVERSE ;  Surgeon: [name], MD;  Location: [facility] OR;  Service: Orthopedics;  Laterality: Right;  . SHOULDER INJECTION Left 09/21/2013   Procedure: SHOULDER INJECTION;  Surgeon: [name], MD;  Location: [facility] OR;  Service: Orthopedics;  Laterality: Left;    Current Medications: No outpatient medications have been marked as taking for the 01/07/18 encounter (Appointment) with Dyann Kief, PA-C.     Allergies:   Patient has no known allergies.   Social History   Socioeconomic History  . Marital status: Widowed    Spouse name: Not on file  . Number of children: Not on file  . Years of education: Not on file  . Highest education level: Not on file  Occupational History  . Not on file  Social Needs  . Financial resource strain: Not on file  . Food insecurity:    Worry: Not on file    Inability: Not on file  . Transportation needs:    Medical: Not on file    Non-medical: Not on file  Tobacco Use  . Smoking status: Former Smoker    Packs/day: 2.00    Types: Cigarettes  . Smokeless tobacco: Never Used  . Tobacco comment: 11/12/2017 "nothing since the 1980s"  Substance and Sexual Activity  . Alcohol use: Not Currently    Comment: 11/12/2017 "nothing since the 1980s"  . Drug use: Never  . Sexual activity: Not Currently  Lifestyle  . Physical activity:    Days per week: Not on file    Minutes per session: Not on file  . Stress: Not on file  Relationships  . Social connections:    Talks on phone: Not on file    Gets together: Not on file    Attends religious service: Not on file    Active member of club or  organization: Not on file    Attends meetings of clubs or organizations: Not on file    Relationship status: Not on file  Other Topics Concern  . Not on file  Social History Narrative   Legally blind.     Family History:  The patient's ***family history is not on file.   ROS:   Please see the history of  present illness.    ROS All other systems reviewed and are negative.   PHYSICAL EXAM:   VS:  There were no vitals taken for this visit.  Physical Exam  GEN: Well nourished, well developed, in no acute distress  HEENT: normal  Neck: no JVD, carotid bruits, or masses Cardiac:RRR; no murmurs, rubs, or gallops  Respiratory:  clear to auscultation bilaterally, normal work of breathing GI: soft, nontender, nondistended, + BS Ext: without cyanosis, clubbing, or edema, Good distal pulses bilaterally MS: no deformity or atrophy  Skin: warm and dry, no rash Neuro:  Alert and Oriented x 3, Strength and sensation are intact Psych: euthymic mood, full affect  Wt Readings from Last 3 Encounters:  12/19/17 240 lb (108.9 kg)  12/16/17 240 lb (108.9 kg)  12/09/17 225 lb 1.4 oz (102.1 kg)      Studies/Labs Reviewed:   EKG:  EKG is*** ordered today.  The ekg ordered today demonstrates ***  Recent Labs: 11/16/2017: Magnesium 1.9 12/08/2017: B Natriuretic Peptide 160.8 12/16/2017: ALT 19 12/19/2017: BUN 24; Creatinine, Ser 1.51; Hemoglobin 11.5; Platelets 248; Potassium 4.0; Sodium 143   Lipid Panel    Component Value Date/Time   CHOL 138 11/11/2017 0217   TRIG 72 11/11/2017 0217   HDL 36 (L) 11/11/2017 0217   CHOLHDL 3.8 11/11/2017 0217   VLDL 14 11/11/2017 0217   LDLCALC 88 11/11/2017 0217    Additional studies/ records that were reviewed today include:   Echocardiogram 11/11/2017 Moderate concentric LVH, EF 40-45, grade 1 diastolic dysfunction, inferior, inferolateral, apical inferior akinesis    Cardiac catheterization 11/10/2017 LAD mid 80, distal 90; D1 95 LCx proximal 90;  OM 2 100, 95 RCA mid 95, distal 80 PCI: 2 x 12 mm resolute Onyx DES distal OM2; 2.5 x 18 mm resolute DES to the OM2; 3 x 18 mm resolute DES to the proximal LCx  Cardiac catheterization 11/12/2017 LAD mid 70, distal 85; D1 95 LCx proximal stent patent; OM2 stent patent RCA proximal 30, mid 95, distal 80 PCI: 2.25 x 12 mm resolute Onyx DES to the distal RCA; 3.0 x 26 mm resolute Onyx DES to the mid RCA    ASSESSMENT:    1. Coronary artery disease involving native coronary artery of native heart without angina pectoris   2. Ischemic cardiomyopathy   3. HTN (hypertension), benign   4. Stage III chronic kidney disease (HCC)      PLAN:  In order of problems listed above:  CAD status post STEMI treated with DES to the OM 2 and DES to the circumflex followed by staged DES to the RCA with residual 70% mid LAD 85% distal LAD and 95% small diagonal 1.  Does have LV dysfunction EF 40 to 45%.  Return to the emergency room with chest pain and it was unclear whether or not he was taking Brilinta so he was reloaded.  Troponins negative.  Another ER visit for syncope in the setting of diarrhea and dehydration.  Ischemic cardiomyopathy ejection fraction 40 to 45% with mild LV dysfunction  Essential hypertension  Stage III CKD    Medication Adjustments/Labs and Tests Ordered: Current medicines are reviewed at length with the patient today.  Concerns regarding medicines are outlined above.  Medication changes, Labs and Tests ordered today are listed in the Patient Instructions below. There are no Patient Instructions on file for this visit.   Signed, Jacolyn Reedy, PA-C  01/06/2018 4:07 PM    [facility] [address], [place], [place]  [address] Phone: [phone]; Fax: [phone]

## 2018-01-07 ENCOUNTER — Ambulatory Visit: Payer: Medicare HMO | Admitting: Physician Assistant

## 2018-01-07 DIAGNOSIS — R0989 Other specified symptoms and signs involving the circulatory and respiratory systems: Secondary | ICD-10-CM

## 2018-01-08 ENCOUNTER — Encounter: Payer: Self-pay | Admitting: Physician Assistant

## 2018-01-20 NOTE — Progress Notes (Signed)
Cardiology Office Note    Date:  01/21/2018   ID:  [name], [name] 1931/05/16, MRN [id number]  PCP:  Ricky Heron, MD  Cardiologist: Thurmon Fair, MD  Chief Complaint  Patient presents with  . Follow-up    History of Present Illness:  [name] is a 84 y.o. male with history of CAD status post MI treated with DES to the OM 2 and DES to the proximal circumflex 11/2017 with staged DES x2 to the RCA.  He has residual 70% mid LAD, 85% distal LAD and 95% small diagonal 1 treated medically.  2D echo LVEF 40 to 45% with mild diastolic dysfunction.  Patient had volume overload requiring IV Lasix as well as an ileus requiring NG tube placement.  Discharged to a skilled nursing facility 11/29/2017.  Readmitted 12/08/2017 with chest pain and had not been taking his Brilinta.  He was reloaded with Brilinta.  Cardiac enzymes are negative x3 and pain spontaneously resolved.  Back in the ER 12/16/2017 with syncope felt secondary to significant diarrhea and likely dehydration and soft blood pressures.  CT of the head was negative.  EKG without acute change.  He was given IV fluids.  Back in the ER 12/19/2017 with knee swelling after tripping and falling and found to have a moderate sized knee effusion.  He was given a knee immobilizer.  Patient comes in today for follow-up accompanied by his son.  He is in a wheelchair.  He has physical therapy working with him and he walks with a walker.  He denies any chest pain, palpitations, dizziness or presyncope.  His son is concerned because he is not on Lasix and says his urine is just a trickle.  He is drinking more water.  He has lost 11 pounds since his MI.  Not using any salt.   Past Medical History:  Diagnosis Date  . Arthritis    "all over" (11/12/2017)  . CAD (coronary artery disease) 11/11/2017   S/p IL STEMI 6/19:  S/p DES to pLCx and DES x 2 to OM2 // staged PCI 6/19:  DES x 2 to RCA // residual CAD at cath 6/19:  mLAD 80, dLAD 90, D1 95  tx medically // EF 40-45 by echo  . Chronic combined systolic and diastolic CHF (congestive heart failure) (HCC)    ischemic CM // s/p MI in 11/2017 >> Echo 6/19:  Moderate concentric LVH, EF 40-45, grade 1 diastolic dysfunction, inferior, inferolateral, apical inferior akinesis   . Chronic lower back pain   . CKD (chronic kidney disease), stage III (HCC)   . Constipation    takes an OTC med as needed  . Diabetic nephropathy (HCC)   . Gastric ulcer 1970s  . Glaucoma, both eyes    uses eye drops daily  . Gout    takes Allopurinol daily  . History of colon polyps   . History of ST elevation myocardial infarction (STEMI) 11/10/2017   PCI to LCx and staged PCI to RCA  . History of stress test    a. 09/2014 MV: EF 53%, no ischemia/infarct.  . Hyperlipidemia    takes Pravastatin daily  . Hypertension    takes Amlodipine and Losartan daily  . Insomnia    uses OTC sleep aide  . Joint pain   . Joint swelling    right knee   . Legally blind   . Macular degeneration   . Morbid obesity (HCC)   . Nocturia   .  Peripheral edema   . Pleurisy 20+yrs ago  . Pneumonia    "I think once when I was a kid" (11/12/2017)  . Sleep apnea   . Stroke [facility])    "I've had them; don't know anything about them"; denies residual on 11/12/2017)  . Syncope    a. 09/2014 in setting of orthostasis; b. 10/2014 Event monitor: 19 beat run of asymp NSVT; c. 01/2015 Echo: EF 60-65%, no rwma.  . Type II diabetes mellitus (HCC)    takes Metformin daily  . Urinary frequency    takes Flomax daily    Past Surgical History:  Procedure Laterality Date  . BACK SURGERY    . CATARACT EXTRACTION W/ INTRAOCULAR LENS  IMPLANT, BILATERAL Bilateral   . COLONOSCOPY    . CORONARY ANGIOGRAPHY N/A 11/12/2017   Procedure: CORONARY ANGIOGRAPHY;  Surgeon: Lennette Bihari, MD;  Location[facility] INVASIVE CV LAB;  Service: Cardiovascular;  Laterality: N/A;  . CORONARY STENT INTERVENTION N/A 11/12/2017   Procedure: CORONARY STENT INTERVENTION;   Surgeon: Lennette Bihari, MD;  Location: [facility] INVASIVE CV LAB;  Service: Cardiovascular;  Laterality: N/A;  . CORONARY/GRAFT ACUTE MI REVASCULARIZATION N/A 11/10/2017   Procedure: Coronary/Graft Acute MI Revascularization;  Surgeon: Lennette Bihari, MD;  Location: [facility] INVASIVE CV LAB;  Service: Cardiovascular;  Laterality: N/A;  . EYE SURGERY    . GLAUCOMA SURGERY Bilateral   . INSERTION / PLACEMENT / REVISION NEUROSTIMULATOR  2000s  . KNEE ARTHROSCOPY Right 1990s  . LEFT HEART CATH AND CORONARY ANGIOGRAPHY N/A 11/10/2017   Procedure: LEFT HEART CATH AND CORONARY ANGIOGRAPHY;  Surgeon: Lennette Bihari, MD;  Location: [facility] INVASIVE CV LAB;  Service: Cardiovascular;  Laterality: N/A;  . MINI SHUNT INSERTION  09/26/2011   Procedure: INSERTION OF MINI SHUNT;  Surgeon: Chalmers Guest, MD;  Location[facility] OR;  Service: Ophthalmology;  Laterality: Right;  Insertion of Ahmed shunt  . POSTERIOR FUSION LUMBAR SPINE  1990s  . REVERSE SHOULDER ARTHROPLASTY Right 09/21/2013   Procedure: RIGHT  SHOULDER ARTHROPLASTY REVERSE ;  Surgeon: [name], MD;  Location: [facility] OR;  Service: Orthopedics;  Laterality: Right;  . SHOULDER INJECTION Left 09/21/2013   Procedure: SHOULDER INJECTION;  Surgeon: [name], MD;  Location: [facility] OR;  Service: Orthopedics;  Laterality: Left;    Current Medications: Current Meds  Medication Sig  . aspirin EC 81 MG EC tablet Take 1 tablet (81 mg total) by mouth daily.  Marland Kitchen atorvastatin (LIPITOR) 80 MG tablet Take 1 tablet (80 mg total) by mouth daily.  Marland Kitchen HYDROcodone-acetaminophen (NORCO/VICODIN) 5-325 MG tablet Take 1 tablet by mouth every 4 (four) hours as needed for moderate pain.  Marland Kitchen loperamide (IMODIUM) 2 MG capsule Take 2 mg by mouth as needed for diarrhea or loose stools.  . metoprolol succinate (TOPROL-XL) 25 MG 24 hr tablet Take 1 tablet (25 mg total) by mouth daily.  . nitroGLYCERIN (NITROSTAT) 0.4 MG SL tablet Place 1 tablet (0.4 mg total) under the tongue every 5 (five) minutes x  3 doses as needed for chest pain.  . prednisoLONE acetate (PRED FORTE) 1 % ophthalmic suspension Place 1 drop into the right eye at bedtime.  . sertraline (ZOLOFT) 50 MG tablet Take 1 tablet (50 mg total) by mouth at bedtime.  . ticagrelor (BRILINTA) 90 MG TABS tablet Take 1 tablet (90 mg total) by mouth 2 (two) times daily.  . timolol (TIMOPTIC) 0.5 % ophthalmic solution Place 1 drop into the right eye daily.  Floyde Parkins Z  0.004 % SOLN ophthalmic solution Place 1 drop into the left eye at bedtime.     Allergies:   Patient has no known allergies.   Social History   Socioeconomic History  . Marital status: Widowed    Spouse name: Not on file  . Number of children: Not on file  . Years of education: Not on file  . Highest education level: Not on file  Occupational History  . Not on file  Social Needs  . Financial resource strain: Not on file  . Food insecurity:    Worry: Not on file    Inability: Not on file  . Transportation needs:    Medical: Not on file    Non-medical: Not on file  Tobacco Use  . Smoking status: Former Smoker    Packs/day: 2.00    Types: Cigarettes  . Smokeless tobacco: Never Used  . Tobacco comment: 11/12/2017 "nothing since the 1980s"  Substance and Sexual Activity  . Alcohol use: Not Currently    Comment: 11/12/2017 "nothing since the 1980s"  . Drug use: Never  . Sexual activity: Not Currently  Lifestyle  . Physical activity:    Days per week: Not on file    Minutes per session: Not on file  . Stress: Not on file  Relationships  . Social connections:    Talks on phone: Not on file    Gets together: Not on file    Attends religious service: Not on file    Active member of club or organization: Not on file    Attends meetings of clubs or organizations: Not on file    Relationship status: Not on file  Other Topics Concern  . Not on file  Social History Narrative   Legally blind.     Family History:  The patient's family history includes  Alzheimer's disease in his maternal aunt; Cancer in his mother.   ROS:   Please see the history of present illness.    Review of Systems  Constitution: Positive for malaise/fatigue and weight loss.  HENT: Negative.   Cardiovascular: Negative.   Respiratory: Negative.   Endocrine: Negative.   Hematologic/Lymphatic: Negative.   Musculoskeletal: Positive for arthritis and muscle weakness.  Gastrointestinal: Negative.   Genitourinary: Positive for hesitancy.  Neurological: Positive for weakness.   All other systems reviewed and are negative.   PHYSICAL EXAM:   VS:  BP 136/86   Pulse 81   Ht 5\' 10"  (1.778 m)   Wt 214 lb 12.8 oz (97.4 kg)   SpO2 97%   BMI 30.82 kg/m   Physical Exam  GEN: Well nourished, well developed, in no acute distress  Neck: no JVD, carotid bruits, or masses Cardiac:RRR; positive S4 Respiratory:  clear to auscultation bilaterally, normal work of breathing GI: soft, nontender, nondistended, + BS Ext: Right arm at cath site without hematoma or hemorrhage, good radial brachial pulses, lower extremities without cyanosis, clubbing, or edema, Good distal pulses bilaterally Neuro:  Alert and Oriented x 3 Psych: euthymic mood, full affect  Wt Readings from Last 3 Encounters:  01/21/18 214 lb 12.8 oz (97.4 kg)  12/19/17 240 lb (108.9 kg)  12/16/17 240 lb (108.9 kg)      Studies/Labs Reviewed:   EKG:  EKG is not ordered today.   Recent Labs: 11/16/2017: Magnesium 1.9 12/08/2017: B Natriuretic Peptide 160.8 12/16/2017: ALT 19 12/19/2017: BUN 24; Creatinine, Ser 1.51; Hemoglobin 11.5; Platelets 248; Potassium 4.0; Sodium 143   Lipid Panel    Component Value  Date/Time   CHOL 138 11/11/2017 0217   TRIG 72 11/11/2017 0217   HDL 36 (L) 11/11/2017 0217   CHOLHDL 3.8 11/11/2017 0217   VLDL 14 11/11/2017 0217   LDLCALC 88 11/11/2017 0217    Additional studies/ records that were reviewed today include:    Echocardiogram 11/11/2017 Moderate concentric LVH, EF  40-45, grade 1 diastolic dysfunction, inferior, inferolateral, apical inferior akinesis.   Cardiac catheterization 11/10/2017 LAD mid 80, distal 90; D1 95 LCx proximal 90; OM 2 100, 95 RCA mid 95, distal 80 PCI: 2 x 12 mm resolute Onyx DES distal OM2; 2.5 x 18 mm resolute DES to the OM2; 3 x 18 mm resolute DES to the proximal LCx Post-Intervention Diagram   ASSESSMENT:    1. Coronary artery disease involving native coronary artery of native heart without angina pectoris   2. Ischemic cardiomyopathy   3. Syncope and collapse   4. HTN (hypertension), benign   5. Mixed hyperlipidemia      PLAN:  In order of problems listed above:  CAD status post MI treated with DES to OM 2 and proximal circumflex 11/2017 with staged DES to the RCA.  Residual 70% mid LAD 85% distal LAD and 95% small diagonal 1 treated medically LVEF 40 to 45%.  Readmitted with chest pain and not taking his Brilinta.  He was reloaded with Brilinta.  Patient has had no further chest pain.  Working with physical therapy.  Taking all his medicines as prescribed.  Continue aspirin, Brilinta, Lipitor.  Follow-up with Dr. Royann Shivers in 2 months.  Ischemic cardiomyopathy ejection fraction 40 to 45%-no evidence of heart failure on exam.  Continue to keep Lasix as needed for weight gain of 2 to 3 pounds overnight, edema or shortness of breath.  Recheck renal function today.  He already follows a low-salt diet.  Syncope with collapse in the ED 12/16/2017 felt secondary to dehydration from diarrhea and hypotension treated with IV fluids-no further dizziness.  Essential hypertension blood pressure had been soft so Lasix stopped.  The pressure is stable today.  Mixed hyperlipidemia on Lipitor will need fasting lipid panel and LFTs in 6 weeks.     Medication Adjustments/Labs and Tests Ordered: Current medicines are reviewed at length with the patient today.  Concerns regarding medicines are outlined above.  Medication changes, Labs and  Tests ordered today are listed in the Patient Instructions below. Patient Instructions  Medication Instructions:  Your physician recommends that you continue on your current medications as directed. Please refer to the Current Medication list given to you today.   Labwork: TODAY: BMET  Testing/Procedures: None ordered  Follow-Up: Your physician wants you to follow-up in: 2 MONTHS WITH DR. Royann Shivers.   Any Other Special Instructions Will Be Listed Below (If Applicable).     If you need a refill on your cardiac medications before your next appointment, please call your pharmacy.      Elson Clan, PA-C  01/21/2018 11:32 AM    [facility] [address] [place], [place], [place]  [address] Phone: [phone]; Fax: [phone]

## 2018-01-21 ENCOUNTER — Encounter: Payer: Self-pay | Admitting: Physician Assistant

## 2018-01-21 ENCOUNTER — Ambulatory Visit: Payer: Medicare HMO | Admitting: Physician Assistant

## 2018-01-21 VITALS — BP 136/86 | HR 81 | Ht 70.0 in | Wt 214.8 lb

## 2018-01-21 DIAGNOSIS — I255 Ischemic cardiomyopathy: Secondary | ICD-10-CM

## 2018-01-21 DIAGNOSIS — R55 Syncope and collapse: Secondary | ICD-10-CM | POA: Diagnosis not present

## 2018-01-21 DIAGNOSIS — I10 Essential (primary) hypertension: Secondary | ICD-10-CM | POA: Diagnosis not present

## 2018-01-21 DIAGNOSIS — I2510 Atherosclerotic heart disease of native coronary artery without angina pectoris: Secondary | ICD-10-CM

## 2018-01-21 DIAGNOSIS — E782 Mixed hyperlipidemia: Secondary | ICD-10-CM

## 2018-01-21 NOTE — Patient Instructions (Addendum)
Medication Instructions:  Your physician recommends that you continue on your current medications as directed. Please refer to the Current Medication list given to you today.   Labwork: TODAY: BMET  Labs to be done at appointment with Dr. Royann Shivers: LFTS, FASTING LIPIDS  Testing/Procedures: None ordered  Follow-Up: Your physician wants you to follow-up in: 2 MONTHS WITH DR. Royann Shivers.   Any Other Special Instructions Will Be Listed Below (If Applicable).     If you need a refill on your cardiac medications before your next appointment, please call your pharmacy.

## 2018-01-22 LAB — BASIC METABOLIC PANEL
BUN/Creatinine Ratio: 12 (ref 10–24)
BUN: 15 mg/dL (ref 8–27)
CO2: 21 mmol/L (ref 20–29)
Calcium: 9.2 mg/dL (ref 8.6–10.2)
Chloride: 105 mmol/L (ref 96–106)
Creatinine, Ser: 1.24 mg/dL (ref 0.76–1.27)
GFR calc Af Amer: 61 mL/min/{1.73_m2} (ref >59)
GFR calc non Af Amer: 53 mL/min/{1.73_m2} — ABNORMAL LOW (ref >59)
Glucose: 89 mg/dL (ref 65–99)
Potassium: 4.5 mmol/L (ref 3.5–5.2)
Sodium: 139 mmol/L (ref 134–144)

## 2018-01-23 ENCOUNTER — Observation Stay (HOSPITAL_COMMUNITY)
Admission: EM | Admit: 2018-01-23 | Discharge: 2018-01-24 | Disposition: A | Discharge status: Home / Self Care | Payer: Medicare HMO | Attending: Internal Medicine | Admitting: Internal Medicine

## 2018-01-23 ENCOUNTER — Encounter (HOSPITAL_COMMUNITY): Payer: Self-pay

## 2018-01-23 ENCOUNTER — Other Ambulatory Visit: Payer: Self-pay

## 2018-01-23 ENCOUNTER — Emergency Department (HOSPITAL_COMMUNITY): Payer: Medicare HMO

## 2018-01-23 DIAGNOSIS — I5022 Chronic systolic (congestive) heart failure: Secondary | ICD-10-CM

## 2018-01-23 DIAGNOSIS — N183 Chronic kidney disease, stage 3 (moderate): Secondary | ICD-10-CM | POA: Diagnosis not present

## 2018-01-23 DIAGNOSIS — I5042 Chronic combined systolic (congestive) and diastolic (congestive) heart failure: Secondary | ICD-10-CM | POA: Insufficient documentation

## 2018-01-23 DIAGNOSIS — R197 Diarrhea, unspecified: Secondary | ICD-10-CM

## 2018-01-23 DIAGNOSIS — I2510 Atherosclerotic heart disease of native coronary artery without angina pectoris: Secondary | ICD-10-CM | POA: Diagnosis not present

## 2018-01-23 DIAGNOSIS — H3530 Unspecified macular degeneration: Secondary | ICD-10-CM | POA: Diagnosis not present

## 2018-01-23 DIAGNOSIS — I252 Old myocardial infarction: Secondary | ICD-10-CM | POA: Diagnosis not present

## 2018-01-23 DIAGNOSIS — R55 Syncope and collapse: Secondary | ICD-10-CM | POA: Diagnosis not present

## 2018-01-23 DIAGNOSIS — I959 Hypotension, unspecified: Secondary | ICD-10-CM | POA: Insufficient documentation

## 2018-01-23 DIAGNOSIS — M109 Gout, unspecified: Secondary | ICD-10-CM | POA: Diagnosis not present

## 2018-01-23 DIAGNOSIS — I130 Hypertensive heart and chronic kidney disease with heart failure and stage 1 through stage 4 chronic kidney disease, or unspecified chronic kidney disease: Secondary | ICD-10-CM | POA: Diagnosis not present

## 2018-01-23 DIAGNOSIS — I452 Bifascicular block: Secondary | ICD-10-CM | POA: Diagnosis not present

## 2018-01-23 DIAGNOSIS — E1122 Type 2 diabetes mellitus with diabetic chronic kidney disease: Secondary | ICD-10-CM | POA: Diagnosis not present

## 2018-01-23 DIAGNOSIS — Z6830 Body mass index (BMI) 30.0-30.9, adult: Secondary | ICD-10-CM | POA: Diagnosis not present

## 2018-01-23 DIAGNOSIS — Z955 Presence of coronary angioplasty implant and graft: Secondary | ICD-10-CM | POA: Insufficient documentation

## 2018-01-23 DIAGNOSIS — M1990 Unspecified osteoarthritis, unspecified site: Secondary | ICD-10-CM | POA: Insufficient documentation

## 2018-01-23 DIAGNOSIS — G4730 Sleep apnea, unspecified: Secondary | ICD-10-CM | POA: Insufficient documentation

## 2018-01-23 DIAGNOSIS — G8929 Other chronic pain: Secondary | ICD-10-CM | POA: Insufficient documentation

## 2018-01-23 DIAGNOSIS — I255 Ischemic cardiomyopathy: Secondary | ICD-10-CM | POA: Insufficient documentation

## 2018-01-23 DIAGNOSIS — N179 Acute kidney failure, unspecified: Secondary | ICD-10-CM

## 2018-01-23 DIAGNOSIS — H409 Unspecified glaucoma: Secondary | ICD-10-CM | POA: Insufficient documentation

## 2018-01-23 DIAGNOSIS — E1121 Type 2 diabetes mellitus with diabetic nephropathy: Secondary | ICD-10-CM | POA: Diagnosis not present

## 2018-01-23 DIAGNOSIS — E785 Hyperlipidemia, unspecified: Secondary | ICD-10-CM | POA: Diagnosis not present

## 2018-01-23 DIAGNOSIS — Z87891 Personal history of nicotine dependence: Secondary | ICD-10-CM | POA: Insufficient documentation

## 2018-01-23 DIAGNOSIS — M545 Low back pain: Secondary | ICD-10-CM | POA: Insufficient documentation

## 2018-01-23 DIAGNOSIS — Z7902 Long term (current) use of antithrombotics/antiplatelets: Secondary | ICD-10-CM | POA: Insufficient documentation

## 2018-01-23 DIAGNOSIS — G4700 Insomnia, unspecified: Secondary | ICD-10-CM | POA: Insufficient documentation

## 2018-01-23 DIAGNOSIS — Z7982 Long term (current) use of aspirin: Secondary | ICD-10-CM | POA: Insufficient documentation

## 2018-01-23 DIAGNOSIS — H548 Legal blindness, as defined in USA: Secondary | ICD-10-CM | POA: Diagnosis not present

## 2018-01-23 DIAGNOSIS — Z8673 Personal history of transient ischemic attack (TIA), and cerebral infarction without residual deficits: Secondary | ICD-10-CM | POA: Insufficient documentation

## 2018-01-23 LAB — CBC
HCT: 40.3 % (ref 39.0–52.0)
Hemoglobin: 12.5 g/dL — ABNORMAL LOW (ref 13.0–17.0)
MCH: 27.9 pg (ref 26.0–34.0)
MCHC: 31 g/dL (ref 30.0–36.0)
MCV: 90 fL (ref 78.0–100.0)
Platelets: 253 10*3/uL (ref 150–400)
RBC: 4.48 MIL/uL (ref 4.22–5.81)
RDW: 14.6 % (ref 11.5–15.5)
WBC: 6.6 10*3/uL (ref 4.0–10.5)

## 2018-01-23 LAB — BASIC METABOLIC PANEL
Anion gap: 8 (ref 5–15)
BUN: 17 mg/dL (ref 8–23)
CO2: 23 mmol/L (ref 22–32)
Calcium: 9.2 mg/dL (ref 8.9–10.3)
Chloride: 107 mmol/L (ref 98–111)
Creatinine, Ser: 1.59 mg/dL — ABNORMAL HIGH (ref 0.61–1.24)
GFR calc Af Amer: 44 mL/min — ABNORMAL LOW (ref >60)
GFR calc non Af Amer: 38 mL/min — ABNORMAL LOW (ref >60)
Glucose, Bld: 141 mg/dL — ABNORMAL HIGH (ref 70–99)
Potassium: 4.4 mmol/L (ref 3.5–5.1)
Sodium: 138 mmol/L (ref 135–145)

## 2018-01-23 LAB — GLUCOSE, CAPILLARY: Glucose-Capillary: 125 mg/dL — ABNORMAL HIGH (ref 70–99)

## 2018-01-23 LAB — URINALYSIS, ROUTINE W REFLEX MICROSCOPIC
Bilirubin Urine: NEGATIVE
Glucose, UA: NEGATIVE mg/dL
Ketones, ur: NEGATIVE mg/dL
Nitrite: NEGATIVE
Protein, ur: 30 mg/dL — AB
Specific Gravity, Urine: 1.017 (ref 1.005–1.030)
WBC, UA: >50 WBC/hpf — ABNORMAL HIGH (ref 0–5)
pH: 5 (ref 5.0–8.0)

## 2018-01-23 LAB — CBG MONITORING, ED: Glucose-Capillary: 135 mg/dL — ABNORMAL HIGH (ref 70–99)

## 2018-01-23 LAB — BRAIN NATRIURETIC PEPTIDE: B Natriuretic Peptide: 67.8 pg/mL (ref 0.0–100.0)

## 2018-01-23 LAB — I-STAT TROPONIN, ED
Troponin i, poc: 0.01 ng/mL (ref 0.00–0.08)
Troponin i, poc: 0 ng/mL (ref 0.00–0.08)

## 2018-01-23 LAB — MAGNESIUM: Magnesium: 2 mg/dL (ref 1.7–2.4)

## 2018-01-23 MED ORDER — TIMOLOL MALEATE 0.5 % OP SOLN
1.0000 [drp] | Freq: Every day | OPHTHALMIC | Status: DC
  Administered 2018-01-23 – 2018-01-24 (×2): 1 [drp] via OPHTHALMIC
  Filled 2018-01-23: qty 5

## 2018-01-23 MED ORDER — ASPIRIN EC 81 MG PO TBEC
81.0000 mg | DELAYED_RELEASE_TABLET | Freq: Every day | ORAL | Status: DC
  Administered 2018-01-24: 81 mg via ORAL
  Filled 2018-01-23: qty 1

## 2018-01-23 MED ORDER — MENTHOL-CAMPHOR 16-11 % EX CREA: 1.0000 "application " | TOPICAL_CREAM | CUTANEOUS | Status: DC | PRN

## 2018-01-23 MED ORDER — PREDNISOLONE ACETATE 1 % OP SUSP
1.0000 [drp] | Freq: Every day | OPHTHALMIC | Status: DC
  Administered 2018-01-23: 1 [drp] via OPHTHALMIC
  Filled 2018-01-23: qty 5

## 2018-01-23 MED ORDER — ATORVASTATIN CALCIUM 20 MG PO TABS
80.0000 mg | ORAL_TABLET | Freq: Every day | ORAL | Status: DC
  Administered 2018-01-23 – 2018-01-24 (×2): 80 mg via ORAL
  Filled 2018-01-23 (×2): qty 4

## 2018-01-23 MED ORDER — LATANOPROST 0.005 % OP SOLN
1.0000 [drp] | Freq: Every day | OPHTHALMIC | Status: DC
  Administered 2018-01-23: 1 [drp] via OPHTHALMIC
  Filled 2018-01-23: qty 2.5

## 2018-01-23 MED ORDER — SERTRALINE HCL 50 MG PO TABS
50.0000 mg | ORAL_TABLET | Freq: Every day | ORAL | Status: DC
  Administered 2018-01-23: 50 mg via ORAL
  Filled 2018-01-23: qty 1

## 2018-01-23 MED ORDER — NITROGLYCERIN 0.4 MG SL SUBL: 0.4000 mg | SUBLINGUAL_TABLET | SUBLINGUAL | Status: DC | PRN

## 2018-01-23 MED ORDER — METOPROLOL SUCCINATE ER 25 MG PO TB24
25.0000 mg | ORAL_TABLET | Freq: Every day | ORAL | Status: DC
  Administered 2018-01-23 – 2018-01-24 (×2): 25 mg via ORAL
  Filled 2018-01-23 (×2): qty 1

## 2018-01-23 MED ORDER — SODIUM CHLORIDE 0.9 % IV BOLUS
1000.0000 mL | Freq: Once | INTRAVENOUS | Status: AC
  Administered 2018-01-23: 1000 mL via INTRAVENOUS

## 2018-01-23 MED ORDER — SODIUM CHLORIDE 0.9 % IV BOLUS
500.0000 mL | Freq: Once | INTRAVENOUS | Status: AC
  Administered 2018-01-23: 500 mL via INTRAVENOUS

## 2018-01-23 MED ORDER — ENOXAPARIN SODIUM 40 MG/0.4ML ~~LOC~~ SOLN
40.0000 mg | SUBCUTANEOUS | Status: DC
  Administered 2018-01-23: 40 mg via SUBCUTANEOUS
  Filled 2018-01-23: qty 0.4

## 2018-01-23 MED ORDER — TICAGRELOR 90 MG PO TABS
90.0000 mg | ORAL_TABLET | Freq: Two times a day (BID) | ORAL | Status: DC
  Administered 2018-01-23 – 2018-01-24 (×2): 90 mg via ORAL
  Filled 2018-01-23 (×2): qty 1

## 2018-01-23 MED ORDER — MUSCLE RUB 10-15 % EX CREA
TOPICAL_CREAM | CUTANEOUS | Status: DC | PRN
  Filled 2018-01-23: qty 85

## 2018-01-23 NOTE — Progress Notes (Signed)
Pt has been made aware of normal result and verbalized understanding.  jw 01/23/18

## 2018-01-23 NOTE — Consult Note (Addendum)
Cardiology Consult    Patient ID: DARON STUTZ MRN: [id number], DOB/AGE: 1931/01/05   Admit date: 01/23/2018 Date of Consult: 01/23/2018  Primary Physician: Clayborn Heron, MD Primary Cardiologist: Dr. Royann Shivers Requesting Provider: Dr. Roda Shutters Reason for Consultation: Syncope   NEHEMIAH MCFARREN is a 84 y.o. male who is being seen today for the evaluation of syncope at the request of Dr. Roda Shutters.   Patient Profile    84 yo male with PMH of CAD s/p MI with DES to OM2 and Lcx with staged DESx2 to the RCA, chronic systolic HF (45%), HTN, HL, DM, Syncope, and stroke who presented with syncope and diarrhea.   Past Medical History   Past Medical History:  Diagnosis Date  . Arthritis    "all over" (11/12/2017)  . CAD (coronary artery disease) 11/11/2017   S/p IL STEMI 6/19:  S/p DES to pLCx and DES x 2 to OM2 // staged PCI 6/19:  DES x 2 to RCA // residual CAD at cath 6/19:  mLAD 80, dLAD 90, D1 95 tx medically // EF 40-45 by echo  . Chronic combined systolic and diastolic CHF (congestive heart failure) (HCC)    ischemic CM // s/p MI in 11/2017 >> Echo 6/19:  Moderate concentric LVH, EF 40-45, grade 1 diastolic dysfunction, inferior, inferolateral, apical inferior akinesis   . Chronic lower back pain   . CKD (chronic kidney disease), stage III (HCC)   . Constipation    takes an OTC med as needed  . Diabetic nephropathy (HCC)   . Gastric ulcer 1970s  . Glaucoma, both eyes    uses eye drops daily  . Gout    takes Allopurinol daily  . History of colon polyps   . History of ST elevation myocardial infarction (STEMI) 11/10/2017   PCI to LCx and staged PCI to RCA  . History of stress test    a. 09/2014 MV: EF 53%, no ischemia/infarct.  . Hyperlipidemia    takes Pravastatin daily  . Hypertension    takes Amlodipine and Losartan daily  . Insomnia    uses OTC sleep aide  . Joint pain   . Joint swelling    right knee   . Legally blind   . Macular degeneration   . Morbid obesity  (HCC)   . Nocturia   . Peripheral edema   . Pleurisy 20+yrs ago  . Pneumonia    "I think once when I was a kid" (11/12/2017)  . Sleep apnea   . Stroke [facility])    "I've had them; don't know anything about them"; denies residual on 11/12/2017)  . Syncope    a. 09/2014 in setting of orthostasis; b. 10/2014 Event monitor: 19 beat run of asymp NSVT; c. 01/2015 Echo: EF 60-65%, no rwma.  . Type II diabetes mellitus (HCC)    takes Metformin daily  . Urinary frequency    takes Flomax daily    Past Surgical History:  Procedure Laterality Date  . BACK SURGERY    . CATARACT EXTRACTION W/ INTRAOCULAR LENS  IMPLANT, BILATERAL Bilateral   . COLONOSCOPY    . CORONARY ANGIOGRAPHY N/A 11/12/2017   Procedure: CORONARY ANGIOGRAPHY;  Surgeon: Lennette Bihari, MD;  Location: [facility] INVASIVE CV LAB;  Service: Cardiovascular;  Laterality: N/A;  . CORONARY STENT INTERVENTION N/A 11/12/2017   Procedure: CORONARY STENT INTERVENTION;  Surgeon: Lennette Bihari, MD;  Location: [facility] INVASIVE CV LAB;  Service: Cardiovascular;  Laterality: N/A;  . CORONARY/GRAFT ACUTE MI REVASCULARIZATION  N/A 11/10/2017   Procedure: Coronary/Graft Acute MI Revascularization;  Surgeon: Lennette Bihari, MD;  Location: [facility] INVASIVE CV LAB;  Service: Cardiovascular;  Laterality: N/A;  . EYE SURGERY    . GLAUCOMA SURGERY Bilateral   . INSERTION / PLACEMENT / REVISION NEUROSTIMULATOR  2000s  . KNEE ARTHROSCOPY Right 1990s  . LEFT HEART CATH AND CORONARY ANGIOGRAPHY N/A 11/10/2017   Procedure: LEFT HEART CATH AND CORONARY ANGIOGRAPHY;  Surgeon: Lennette Bihari, MD;  Location: [facility] INVASIVE CV LAB;  Service: Cardiovascular;  Laterality: N/A;  . MINI SHUNT INSERTION  09/26/2011   Procedure: INSERTION OF MINI SHUNT;  Surgeon: Chalmers Guest, MD;  Location: [facility] OR;  Service: Ophthalmology;  Laterality: Right;  Insertion of Ahmed shunt  . POSTERIOR FUSION LUMBAR SPINE  1990s  . REVERSE SHOULDER ARTHROPLASTY Right 09/21/2013   Procedure: RIGHT  SHOULDER ARTHROPLASTY  REVERSE ;  Surgeon: [name], MD;  Location: [facility] OR;  Service: Orthopedics;  Laterality: Right;  . SHOULDER INJECTION Left 09/21/2013   Procedure: SHOULDER INJECTION;  Surgeon: [name], MD;  Location: [facility] OR;  Service: Orthopedics;  Laterality: Left;     Allergies  No Known Allergies  History of Present Illness    Mr. Deanna Artis is an 84 year old male with past medical history of CAD s/p MI with DES to OM2 and Lcx with staged DESx2 to the RCA, chronic systolic HF (45%), HTN, HL, DM, Syncope, and stroke.  He was admitted back in June with an MI and underwent cardiac cath with stenting to his OM 2 and proximal circumflex with staged intervention to his RCA.  Noted to have residual disease with 70% in the mid LAD, 85% in the distal LAD and 95% small diagonal lesion which were all treated medically.  Echo at that time showed an EF of 40 to 45% with mild diastolic dysfunction.  During that admission he developed volume overload and required IV Lasix.  Also developed an ileus and required NG tube.  He was discharged to skilled nursing facility on 11/29/2017, and then readmitted on 12/08/2017 with chest pain as he had not been taking his Brilinta.  He was reloaded and cardiac enzymes cycled and negative x3.  His chest pain resolved spontaneously.  He presented back to the ED on 12/16/2017 with syncope which was felt to be secondary to multiple episodes of diarrhea leading to dehydration and soft blood pressures.  CT of his head was negative, and EKG was without acute change.  He was given IV fluids and discharged.  Did present back to the ED shortly thereafter with knee swelling after tripping and falling and found to have a moderate sized effusion and was placed in a knee immobilizer.  He was seen in the office on 8/25 Herma Carson for follow-up.  Reported to be working with physical therapy, and using a walker and wheelchair.  Son did express concern that he was not on Lasix and was concerned about  volume overload.  Of note he has lost 11 pounds since his MI.  He was continued on his home medications including aspirin, Brilinta, Lipitor, and as needed Lasix.   Patient reports he has been in his usual state of health up until several days ago when he developed diarrhea.  States that he has had multiple episodes for at least the past couple of days, and has been trying to stay hydrated.  Today he was in his home walking back from the bathroom after having an episode of diarrhea whenever  he felt lightheaded and dizzy and had a syncopal episode witnessed by his son.  He denies any chest pain, shortness of breath palpitations prior to this episode.  He was brought to the ED for further evaluation.  On admission his labs showed stable electrolytes, creatinine 1.59, BNP 67, POC troponin negative x2, hemoglobin 12.5.  Chest x-ray was negative.  EKG showed sinus rhythm with known right bundle branch block and left anterior fascicular block with prolonged PR interval.  In review of notes when EMS arrived to pick up the patient he was pale and lethargic, with blood pressure palpated at 60 and a heart rate of 50.  He was given 500 cc normal saline with improvement in blood pressure.  At the time of assessment patient is asymptomatic.  Inpatient Medications    . aspirin  81 mg Oral Daily  . atorvastatin  80 mg Oral Daily  . latanoprost  1 drop Left Eye QHS  . prednisoLONE acetate  1 drop Right Eye QHS  . sertraline  50 mg Oral QHS  . ticagrelor  90 mg Oral BID  . timolol  1 drop Right Eye Daily    Family History    Family History  Problem Relation Age of Onset  . Cancer Mother   . Alzheimer's disease Maternal Aunt   . Anesthesia problems Neg Hx     Social History    Social History   Socioeconomic History  . Marital status: Widowed    Spouse name: Not on file  . Number of children: Not on file  . Years of education: Not on file  . Highest education level: Not on file  Occupational History   . Not on file  Social Needs  . Financial resource strain: Not on file  . Food insecurity:    Worry: Not on file    Inability: Not on file  . Transportation needs:    Medical: Not on file    Non-medical: Not on file  Tobacco Use  . Smoking status: Former Smoker    Packs/day: 2.00    Types: Cigarettes  . Smokeless tobacco: Never Used  . Tobacco comment: 11/12/2017 "nothing since the 1980s"  Substance and Sexual Activity  . Alcohol use: Not Currently    Comment: 11/12/2017 "nothing since the 1980s"  . Drug use: Never  . Sexual activity: Not Currently  Lifestyle  . Physical activity:    Days per week: Not on file    Minutes per session: Not on file  . Stress: Not on file  Relationships  . Social connections:    Talks on phone: Not on file    Gets together: Not on file    Attends religious service: Not on file    Active member of club or organization: Not on file    Attends meetings of clubs or organizations: Not on file    Relationship status: Not on file  . Intimate partner violence:    Fear of current or ex partner: Not on file    Emotionally abused: Not on file    Physically abused: Not on file    Forced sexual activity: Not on file  Other Topics Concern  . Not on file  Social History Narrative   Legally blind.     Review of Systems    See HPI  All other systems reviewed and are otherwise negative except as noted above.  Physical Exam    Blood pressure 126/87, pulse 68, temperature 97.6 F (36.4 C), temperature  source Oral, resp. rate 13, height 5\' 10"  (1.778 m), weight 95.3 kg, SpO2 99 %.  General: Pleasant, older AAM, NAD Psych: Normal affect. Neuro: Alert and oriented X 3. Moves all extremities spontaneously. HEENT: Normal  Neck: Supple, no JVD. Lungs:  Resp regular and unlabored, CTA. Heart: RRR no s3, s4, or murmurs. Abdomen: Soft, non-tender, non-distended, BS + x 4.  Extremities: No clubbing, cyanosis or edema. DP/PT/Radials 2+ and equal  bilaterally.  Labs    Troponin [facility] Test) Recent Labs    01/23/18 1543  TROPIPOC 0.00   No results for input(s): CKTOTAL, CKMB, TROPONINI in the last 72 hours. Lab Results  Component Value Date   WBC 6.6 01/23/2018   HGB 12.5 (L) 01/23/2018   HCT 40.3 01/23/2018   MCV 90.0 01/23/2018   PLT 253 01/23/2018    Recent Labs  Lab 01/23/18 1010  NA 138  K 4.4  CL 107  CO2 23  BUN 17  CREATININE 1.59*  CALCIUM 9.2  GLUCOSE 141*   Lab Results  Component Value Date   CHOL 138 11/11/2017   HDL 36 (L) 11/11/2017   LDLCALC 88 11/11/2017   TRIG 72 11/11/2017   No results found for: [facility]   Radiology Studies    Dg Chest 2 View  Result Date: 01/23/2018 CLINICAL DATA:  Syncope today. EXAM: CHEST - 2 VIEW COMPARISON:  Chest x-rays dated 12/08/2017 and 11/10/2017. FINDINGS: Heart size and mediastinal contours are stable. Lungs are clear. No pleural effusion or pneumothorax seen. Stable elevation of the RIGHT hemidiaphragm. Degenerative changes again noted at the LEFT shoulder. RIGHT shoulder arthroplasty hardware incompletely imaged. No acute or suspicious osseous finding. IMPRESSION: No active cardiopulmonary disease. No evidence of pneumonia or pulmonary edema. Stable cardiomegaly. Electronically Signed   By: Bary Richard M.D.   On: 01/23/2018 10:48    ECG & Cardiac Imaging    EKG:  The EKG was personally reviewed and demonstrates SR with known RBBB  Echo: 11/11/17  Study Conclusions  - Left ventricle: The cavity size was normal. There was moderate   concentric hypertrophy. Systolic function was mildly to   moderately reduced. The estimated ejection fraction was in the   range of 40% to 45%. Doppler parameters are consistent with   abnormal left ventricular relaxation (grade 1 diastolic   dysfunction). There was no evidence of elevated ventricular   filling pressure by Doppler parameters. - Aortic valve: Valve area (VTI): 2.08 cm^2. Valve area (Vmax):   2.13  cm^2. Valve area (Vmean): 1.98 cm^2. - Aortic root: The aortic root was normal in size. - Mitral valve: Valve area by pressure half-time: 1.28 cm^2. - Left atrium: The atrium was normal in size. - Right ventricle: The cavity size was normal. Wall thickness was   normal. Systolic function was normal. - Right atrium: The atrium was normal in size. - Tricuspid valve: There was no regurgitation. - Pulmonary arteries: Systolic pressure could not be accurately   estimated. - Inferior vena cava: The vessel was normal in size. - Pericardium, extracardiac: There was no pericardial effusion.  Impressions:  - There is akinesis of the basal and mid inferior, inefrolateral   and apical inferior leads.  Assessment & Plan    84 yo male with PMH of CAD s/p MI with DES to OM2 and Lcx with staged DESx2 to the RCA, chronic systolic HF (45%), HTN, HL, DM, Syncope, and stroke who presented with syncope and diarrhea.   1. Syncope: has had several  days of diarrhea multiple times a day. He's been trying to stay hydrated. Had syncope after walking back from the bathroom this afternoon. No chest pain, shortness of breath or palpitations. Noted to have palpable blood pressure of 60 on EMS arrival. Given IVFs with improvement. Similar episode back in July. Suspect this is all related to his dehydration and diarrhea. Need to correct his GI issues.  -- consider 30 day monitor at the time of discharge if nothing noted on telemetry.  2. Diarrhea: similar presentation back in July. Several days of diarrhea with multiple episodes a day. No abd pain, or n/v. Work up per primary. Did have an ileus back in 6/19.  3. CAD: s/p DES to the OM2, Lcx, and RCA back in 6/19. Remains on DAPT with ASA/Brilinta. No chest pain.  4. Chronic combined HF: Echo back in 6/19 with EF of 45%. No signs of volume overload on exam. Using PRN lasix at home. No indication for diuresis.   5. HTN: actually noted to be hypotensive on EMS arrival.  Improved with IVFs. Cautious when adding back blood pressures meds.   Janice Coffin, NP-C Pager [phone] 01/23/2018, 4:37 PM

## 2018-01-23 NOTE — H&P (Signed)
History and Physical  [name] GNF:[id number] DOB: 24-Dec-1930 DOA: 01/23/2018  Referring physician: EDP PCP: Clayborn Heron, MD   Chief Complaint: recurrent syncope, diarrhea  HPI: [name] is a 84 y.o. male diet controlled DM2, macular degeneration,  legal blindness , HLD, remote lacunar infracts , h/o CAD s/p MI with DES to OM2 and Lcx with staged DESx2 to the RCA in 12/2017, chronic systolic HF (45%)  , he is brought to the ED by EMS due to syncope episode after having diarrhea, he reports brief loss of consciousness, he denies jerky movement, no bowel or bladder incontinence, he denies prodrome, no dizziness, no chest pain , no palpitation prior to the even.  He denies abdominal pain, no fever.  ED course:  Pt was pale and lethargic , diaphoretic at home per EMS . Initial bp 60 palpated with HR 50. He received 500cc bolus ivf by EMS. On arrival to the ED , sbp in the 90's. He is alert, No fever, no hypoxia, ekg sinus rhythm, Qtc 497, chronic RBBB,  No acute ST/T changes, troponin negative, cxr Stable cardiomegaly, no acute findings. He is given additional ns bolus one liter, hospitalist called to further manage the patient. EDP called cardiology for recurrent syncope.  Patient does not have diarrhea while in the ED. He reports weight loss since hospitalization in 12/2017. He had similar episode on 7/18 with syncope and diarrhea which required ED visit,  Stool study was negative that time.  He just saw cardiology in the office on 8/20.   Review of Systems:  Detail per HPI, Review of systems are otherwise negative  Past Medical History:  Diagnosis Date  . Arthritis    "all over" (11/12/2017)  . CAD (coronary artery disease) 11/11/2017   S/p IL STEMI 6/19:  S/p DES to pLCx and DES x 2 to OM2 // staged PCI 6/19:  DES x 2 to RCA // residual CAD at cath 6/19:  mLAD 80, dLAD 90, D1 95 tx medically // EF 40-45 by echo  . Chronic combined systolic and diastolic CHF (congestive  heart failure) (HCC)    ischemic CM // s/p MI in 11/2017 >> Echo 6/19:  Moderate concentric LVH, EF 40-45, grade 1 diastolic dysfunction, inferior, inferolateral, apical inferior akinesis   . Chronic lower back pain   . CKD (chronic kidney disease), stage III (HCC)   . Constipation    takes an OTC med as needed  . Diabetic nephropathy (HCC)   . Gastric ulcer 1970s  . Glaucoma, both eyes    uses eye drops daily  . Gout    takes Allopurinol daily  . History of colon polyps   . History of ST elevation myocardial infarction (STEMI) 11/10/2017   PCI to LCx and staged PCI to RCA  . History of stress test    a. 09/2014 MV: EF 53%, no ischemia/infarct.  . Hyperlipidemia    takes Pravastatin daily  . Hypertension    takes Amlodipine and Losartan daily  . Insomnia    uses OTC sleep aide  . Joint pain   . Joint swelling    right knee   . Legally blind   . Macular degeneration   . Morbid obesity (HCC)   . Nocturia   . Peripheral edema   . Pleurisy 20+yrs ago  . Pneumonia    "I think once when I was a kid" (11/12/2017)  . Sleep apnea   . Stroke [facility])    "I've had  them; don't know anything about them"; denies residual on 11/12/2017)  . Syncope    a. 09/2014 in setting of orthostasis; b. 10/2014 Event monitor: 19 beat run of asymp NSVT; c. 01/2015 Echo: EF 60-65%, no rwma.  . Type II diabetes mellitus (HCC)    takes Metformin daily  . Urinary frequency    takes Flomax daily   Past Surgical History:  Procedure Laterality Date  . BACK SURGERY    . CATARACT EXTRACTION W/ INTRAOCULAR LENS  IMPLANT, BILATERAL Bilateral   . COLONOSCOPY    . CORONARY ANGIOGRAPHY N/A 11/12/2017   Procedure: CORONARY ANGIOGRAPHY;  Surgeon: Lennette Bihari, MD;  Location: [facility] INVASIVE CV LAB;  Service: Cardiovascular;  Laterality: N/A;  . CORONARY STENT INTERVENTION N/A 11/12/2017   Procedure: CORONARY STENT INTERVENTION;  Surgeon: Lennette Bihari, MD;  Location: [facility] INVASIVE CV LAB;  Service: Cardiovascular;   Laterality: N/A;  . CORONARY/GRAFT ACUTE MI REVASCULARIZATION N/A 11/10/2017   Procedure: Coronary/Graft Acute MI Revascularization;  Surgeon: Lennette Bihari, MD;  Location: [facility] INVASIVE CV LAB;  Service: Cardiovascular;  Laterality: N/A;  . EYE SURGERY    . GLAUCOMA SURGERY Bilateral   . INSERTION / PLACEMENT / REVISION NEUROSTIMULATOR  2000s  . KNEE ARTHROSCOPY Right 1990s  . LEFT HEART CATH AND CORONARY ANGIOGRAPHY N/A 11/10/2017   Procedure: LEFT HEART CATH AND CORONARY ANGIOGRAPHY;  Surgeon: Lennette Bihari, MD;  Location: [facility] INVASIVE CV LAB;  Service: Cardiovascular;  Laterality: N/A;  . MINI SHUNT INSERTION  09/26/2011   Procedure: INSERTION OF MINI SHUNT;  Surgeon: Chalmers Guest, MD;  Location: [facility] OR;  Service: Ophthalmology;  Laterality: Right;  Insertion of Ahmed shunt  . POSTERIOR FUSION LUMBAR SPINE  1990s  . REVERSE SHOULDER ARTHROPLASTY Right 09/21/2013   Procedure: RIGHT  SHOULDER ARTHROPLASTY REVERSE ;  Surgeon: [name], MD;  Location: [facility] OR;  Service: Orthopedics;  Laterality: Right;  . SHOULDER INJECTION Left 09/21/2013   Procedure: SHOULDER INJECTION;  Surgeon: [name], MD;  Location: [facility] OR;  Service: Orthopedics;  Laterality: Left;   Social History:  reports that he has quit smoking. His smoking use included cigarettes. He smoked 2.00 packs per day. He has never used smokeless tobacco. He reports that he drank alcohol. He reports that he does not use drugs. Patient lives at home & he needs walker and wheelchair  No Known Allergies  Family History  Problem Relation Age of Onset  . Cancer Mother   . Alzheimer's disease Maternal Aunt   . Anesthesia problems Neg Hx       Prior to Admission medications   Medication Sig Start Date End Date Taking? Authorizing Provider  acetaminophen (TYLENOL) 500 MG tablet Take 1,000 mg by mouth as needed for mild pain.   Yes [provider]  aspirin EC 81 MG EC tablet Take 1 tablet (81 mg total) by mouth daily. 11/19/17   Yes Barrett, Joline Salt, PA-C  atorvastatin (LIPITOR) 80 MG tablet Take 1 tablet (80 mg total) by mouth daily. 12/09/17  Yes Robbie Lis M, PA-C  loperamide (IMODIUM) 2 MG capsule Take 2 mg by mouth as needed for diarrhea or loose stools.   Yes [provider]  Menthol-Camphor (ICY HOT ADVANCED RELIEF) 16-11 % CREA Apply 1 application topically as needed (knee pain).   Yes [provider]  metoprolol succinate (TOPROL-XL) 25 MG 24 hr tablet Take 1 tablet (25 mg total) by mouth daily. 12/09/17  Yes Robbie Lis M, PA-C  nitroGLYCERIN (  NITROSTAT) 0.4 MG SL tablet Place 1 tablet (0.4 mg total) under the tongue every 5 (five) minutes x 3 doses as needed for chest pain. 12/09/17  Yes Sharol Harness, Brittainy M, PA-C  prednisoLONE acetate (PRED FORTE) 1 % ophthalmic suspension Place 1 drop into the right eye at bedtime. 11/29/17  Yes Medina-Vargas, Monina C, NP  sertraline (ZOLOFT) 50 MG tablet Take 1 tablet (50 mg total) by mouth at bedtime. 11/29/17  Yes Medina-Vargas, Monina C, NP  ticagrelor (BRILINTA) 90 MG TABS tablet Take 1 tablet (90 mg total) by mouth 2 (two) times daily. 12/09/17  Yes Sharol Harness, Brittainy M, PA-C  timolol (TIMOPTIC) 0.5 % ophthalmic solution Place 1 drop into the right eye daily. 11/29/17  Yes Medina-Vargas, Monina C, NP  TRAVATAN Z 0.004 % SOLN ophthalmic solution Place 1 drop into the left eye at bedtime. 11/29/17  Yes Medina-Vargas, Monina C, NP  HYDROcodone-acetaminophen (NORCO/VICODIN) 5-325 MG tablet Take 1 tablet by mouth every 4 (four) hours as needed for moderate pain. Patient not taking: Reported on 01/23/2018 12/19/17   Azalia Bilis, MD    Physical Exam: BP 126/87   Pulse 68   Temp 97.6 F (36.4 C) (Oral)   Resp 13   Ht 5\' 10"  (1.778 m)   Wt 95.3 kg   SpO2 99%   BMI 30.13 kg/m   General:  Legally blind Eyes: PERRL ENT: unremarkable Neck: supple, no JVD Cardiovascular: RRR Respiratory: CTABL Abdomen: soft/NT/ND, positive bowel sounds Skin: no  rash Musculoskeletal:  No edema Psychiatric: calm/cooperative Neurologic: no focal findings            Labs on Admission:  Basic Metabolic Panel: Recent Labs  Lab 01/21/18 1201 01/23/18 1010  NA 139 138  K 4.5 4.4  CL 105 107  CO2 21 23  GLUCOSE 89 141*  BUN 15 17  CREATININE 1.24 1.59*  CALCIUM 9.2 9.2  MG  --  2.0   Liver Function Tests: No results for input(s): AST, ALT, ALKPHOS, BILITOT, PROT, ALBUMIN in the last 168 hours. No results for input(s): LIPASE, AMYLASE in the last 168 hours. No results for input(s): AMMONIA in the last 168 hours. CBC: Recent Labs  Lab 01/23/18 1010  WBC 6.6  HGB 12.5*  HCT 40.3  MCV 90.0  PLT 253   Cardiac Enzymes: No results for input(s): CKTOTAL, CKMB, CKMBINDEX, TROPONINI in the last 168 hours.  BNP (last 3 results) Recent Labs    12/08/17 1122 01/23/18 1010  BNP 160.8* 67.8    ProBNP (last 3 results) No results for input(s): PROBNP in the last 8760 hours.  CBG: Recent Labs  Lab 01/23/18 1013  GLUCAP 135*    Radiological Exams on Admission: Dg Chest 2 View  Result Date: 01/23/2018 CLINICAL DATA:  Syncope today. EXAM: CHEST - 2 VIEW COMPARISON:  Chest x-rays dated 12/08/2017 and 11/10/2017. FINDINGS: Heart size and mediastinal contours are stable. Lungs are clear. No pleural effusion or pneumothorax seen. Stable elevation of the RIGHT hemidiaphragm. Degenerative changes again noted at the LEFT shoulder. RIGHT shoulder arthroplasty hardware incompletely imaged. No acute or suspicious osseous finding. IMPRESSION: No active cardiopulmonary disease. No evidence of pneumonia or pulmonary edema. Stable cardiomegaly. Electronically Signed   By: Bary Richard M.D.   On: 01/23/2018 10:48      Assessment/Plan Present on Admission: **None**    Recurrent syncope/bradycardia?/hypotension -likely from dehydration, but will keep on tele, rule out infection, I have ordered ua and blood culture. -cardiology consulted for  recurrent syncope, /betablocker/lasix/ivf  per cardiology  Diarrhea? -non in the ED -no fever, no leukocytosis, no abdominal pain -monitor  Mild AKI on CKDII -cr was 1,2 on 8/20 -cr 1.59 on presentation -ua pending, he received hydration -repeat bmp in am -renal dosing meds  CAD s/p recent stenting -denies chest pain, negative troponin -continue asa/brillinta/statin -cardiology following   Chronic systolic CHF  -currently dry to euvolemic -cardiology following  FTT /baseline legally blind Walker and wheelchair Will get Pt/OT  He reports weight loss since discharged in 12/2017 Nutrition consult  DVT prophylaxis: lovenox  Consultants:  Cardiology  Code Status: full   Family Communication:  Patient   Disposition Plan: med tele observation  Time spent:  Albertine Grates MD, PhD [facility] Pager [phone] If 7PM-7AM, please contact night-coverage at [email or web address], password [facility]

## 2018-01-23 NOTE — ED Notes (Signed)
Pt sleeping at intervals laying on left side and rouses to speak in complete sentences. Warm blanket given.

## 2018-01-23 NOTE — ED Triage Notes (Signed)
Pt arrives EMS from home where he had syncope while sitting on bath chair. Pt had diarrhea multiple times yesterday and continues today. Pt was pale and lethargic , diaphoretic at home . Initial bp 60 palpated with HR 50. Given 500 ns  PTA and still pale but now dry and alert. bp arrival 108/70.

## 2018-01-23 NOTE — ED Notes (Signed)
Adult protective services in room to talk to pt

## 2018-01-23 NOTE — ED Provider Notes (Signed)
[facility] EMERGENCY DEPARTMENT Provider Note   CSN: [id number] Arrival date & time: 01/23/18  4098     History   Chief Complaint Chief Complaint  Patient presents with  . Near Syncope  . Diarrhea    HPI [name] is a 84 y.o. male.  The history is provided by the patient and medical records. No language interpreter was used.  Loss of Consciousness   This is a recurrent problem. The current episode started less than 1 hour ago. The problem occurs rarely. The problem has been resolved. Length of episode of loss of consciousness: unclear time of LOC. The problem is associated with bowel movements (diarrhea). Associated symptoms include diaphoresis, light-headedness, malaise/fatigue and nausea. Pertinent negatives include abdominal pain, back pain, bladder incontinence, bowel incontinence, chest pain, congestion, dizziness, fever, headaches, palpitations, vertigo, visual change (blind), vomiting and weakness. He has tried nothing for the symptoms. The treatment provided no relief. His past medical history is significant for CAD, CVA, DM and HTN.    Past Medical History:  Diagnosis Date  . Arthritis    "all over" (11/12/2017)  . CAD (coronary artery disease) 11/11/2017   S/p IL STEMI 6/19:  S/p DES to pLCx and DES x 2 to OM2 // staged PCI 6/19:  DES x 2 to RCA // residual CAD at cath 6/19:  mLAD 80, dLAD 90, D1 95 tx medically // EF 40-45 by echo  . Chronic combined systolic and diastolic CHF (congestive heart failure) (HCC)    ischemic CM // s/p MI in 11/2017 >> Echo 6/19:  Moderate concentric LVH, EF 40-45, grade 1 diastolic dysfunction, inferior, inferolateral, apical inferior akinesis   . Chronic lower back pain   . CKD (chronic kidney disease), stage III (HCC)   . Constipation    takes an OTC med as needed  . Diabetic nephropathy (HCC)   . Gastric ulcer 1970s  . Glaucoma, both eyes    uses eye drops daily  . Gout    takes Allopurinol daily  .  History of colon polyps   . History of ST elevation myocardial infarction (STEMI) 11/10/2017   PCI to LCx and staged PCI to RCA  . History of stress test    a. 09/2014 MV: EF 53%, no ischemia/infarct.  . Hyperlipidemia    takes Pravastatin daily  . Hypertension    takes Amlodipine and Losartan daily  . Insomnia    uses OTC sleep aide  . Joint pain   . Joint swelling    right knee   . Legally blind   . Macular degeneration   . Morbid obesity (HCC)   . Nocturia   . Peripheral edema   . Pleurisy 20+yrs ago  . Pneumonia    "I think once when I was a kid" (11/12/2017)  . Sleep apnea   . Stroke [facility])    "I've had them; don't know anything about them"; denies residual on 11/12/2017)  . Syncope    a. 09/2014 in setting of orthostasis; b. 10/2014 Event monitor: 19 beat run of asymp NSVT; c. 01/2015 Echo: EF 60-65%, no rwma.  . Type II diabetes mellitus (HCC)    takes Metformin daily  . Urinary frequency    takes Flomax daily    Patient Active Problem List   Diagnosis Date Noted  . Chest pain 12/08/2017  . Ischemic cardiomyopathy 11/28/2017  . Neurocognitive deficits 11/19/2017  . Bilious vomiting   . Ileus, unspecified (HCC) 11/13/2017  . Sleep apnea  11/13/2017  . Obesity 11/13/2017  . Stage III chronic kidney disease (HCC) 11/11/2017  . Acute on chronic combined systolic and diastolic CHF (congestive heart failure) (HCC) 11/11/2017  . CAD (coronary artery disease) 11/11/2017  . Acute ST elevation myocardial infarction (STEMI) of inferolateral wall (HCC) 11/10/2017  . ST elevation myocardial infarction (STEMI) (HCC)   . AKI (acute kidney injury) (HCC) 01/22/2015  . Lactic acidosis 01/22/2015  . Syncope and collapse   . HTN (hypertension), benign   . Encephalopathy 09/07/2014  . Osteoarthrosis, unspecified whether generalized or localized, shoulder region 09/21/2013  . Gout 12/23/2012  . Hypotension 12/21/2012  . Type 2 diabetes mellitus with complication, without long-term  current use of insulin (HCC) 12/21/2012  . Hyperlipidemia   . Morbid obesity (HCC)     Past Surgical History:  Procedure Laterality Date  . BACK SURGERY    . CATARACT EXTRACTION W/ INTRAOCULAR LENS  IMPLANT, BILATERAL Bilateral   . COLONOSCOPY    . CORONARY ANGIOGRAPHY N/A 11/12/2017   Procedure: CORONARY ANGIOGRAPHY;  Surgeon: Lennette Bihari, MD;  Location: [facility] INVASIVE CV LAB;  Service: Cardiovascular;  Laterality: N/A;  . CORONARY STENT INTERVENTION N/A 11/12/2017   Procedure: CORONARY STENT INTERVENTION;  Surgeon: Lennette Bihari, MD;  Location: [facility] INVASIVE CV LAB;  Service: Cardiovascular;  Laterality: N/A;  . CORONARY/GRAFT ACUTE MI REVASCULARIZATION N/A 11/10/2017   Procedure: Coronary/Graft Acute MI Revascularization;  Surgeon: Lennette Bihari, MD;  Location: [facility] INVASIVE CV LAB;  Service: Cardiovascular;  Laterality: N/A;  . EYE SURGERY    . GLAUCOMA SURGERY Bilateral   . INSERTION / PLACEMENT / REVISION NEUROSTIMULATOR  2000s  . KNEE ARTHROSCOPY Right 1990s  . LEFT HEART CATH AND CORONARY ANGIOGRAPHY N/A 11/10/2017   Procedure: LEFT HEART CATH AND CORONARY ANGIOGRAPHY;  Surgeon: Lennette Bihari, MD;  Location: [facility] INVASIVE CV LAB;  Service: Cardiovascular;  Laterality: N/A;  . MINI SHUNT INSERTION  09/26/2011   Procedure: INSERTION OF MINI SHUNT;  Surgeon: Chalmers Guest, MD;  Location: [facility] OR;  Service: Ophthalmology;  Laterality: Right;  Insertion of Ahmed shunt  . POSTERIOR FUSION LUMBAR SPINE  1990s  . REVERSE SHOULDER ARTHROPLASTY Right 09/21/2013   Procedure: RIGHT  SHOULDER ARTHROPLASTY REVERSE ;  Surgeon: [name], MD;  Location: [facility] OR;  Service: Orthopedics;  Laterality: Right;  . SHOULDER INJECTION Left 09/21/2013   Procedure: SHOULDER INJECTION;  Surgeon: [name], MD;  Location: [facility] OR;  Service: Orthopedics;  Laterality: Left;        Home Medications    Prior to Admission medications   Medication Sig Start Date End Date Taking? Authorizing Provider  aspirin  EC 81 MG EC tablet Take 1 tablet (81 mg total) by mouth daily. 11/19/17   Barrett, Joline Salt, PA-C  atorvastatin (LIPITOR) 80 MG tablet Take 1 tablet (80 mg total) by mouth daily. 12/09/17   Allayne Butcher, PA-C  HYDROcodone-acetaminophen (NORCO/VICODIN) 5-325 MG tablet Take 1 tablet by mouth every 4 (four) hours as needed for moderate pain. 12/19/17   Azalia Bilis, MD  loperamide (IMODIUM) 2 MG capsule Take 2 mg by mouth as needed for diarrhea or loose stools.    [provider]  metoprolol succinate (TOPROL-XL) 25 MG 24 hr tablet Take 1 tablet (25 mg total) by mouth daily. 12/09/17   Robbie Lis M, PA-C  nitroGLYCERIN (NITROSTAT) 0.4 MG SL tablet Place 1 tablet (0.4 mg total) under the tongue every 5 (five) minutes x 3 doses as needed for chest  pain. 12/09/17   Robbie Lis M, PA-C  prednisoLONE acetate (PRED FORTE) 1 % ophthalmic suspension Place 1 drop into the right eye at bedtime. 11/29/17   Medina-Vargas, Monina C, NP  sertraline (ZOLOFT) 50 MG tablet Take 1 tablet (50 mg total) by mouth at bedtime. 11/29/17   Medina-Vargas, Monina C, NP  ticagrelor (BRILINTA) 90 MG TABS tablet Take 1 tablet (90 mg total) by mouth 2 (two) times daily. 12/09/17   Robbie Lis M, PA-C  timolol (TIMOPTIC) 0.5 % ophthalmic solution Place 1 drop into the right eye daily. 11/29/17   Medina-Vargas, Monina C, NP  TRAVATAN Z 0.004 % SOLN ophthalmic solution Place 1 drop into the left eye at bedtime. 11/29/17   Medina-Vargas, Margit Banda, NP    Family History Family History  Problem Relation Age of Onset  . Cancer Mother   . Alzheimer's disease Maternal Aunt   . Anesthesia problems Neg Hx     Social History Social History   Tobacco Use  . Smoking status: Former Smoker    Packs/day: 2.00    Types: Cigarettes  . Smokeless tobacco: Never Used  . Tobacco comment: 11/12/2017 "nothing since the 1980s"  Substance Use Topics  . Alcohol use: Not Currently    Comment: 11/12/2017 "nothing since the  1980s"  . Drug use: Never     Allergies   Patient has no known allergies.   Review of Systems Review of Systems  Constitutional: Positive for diaphoresis, fatigue and malaise/fatigue. Negative for chills and fever.  HENT: Negative for congestion.   Respiratory: Negative for cough, chest tightness, shortness of breath and wheezing.   Cardiovascular: Positive for leg swelling (mild) and syncope. Negative for chest pain and palpitations.  Gastrointestinal: Positive for diarrhea and nausea. Negative for abdominal pain, bowel incontinence, constipation and vomiting.  Genitourinary: Positive for dysuria. Negative for bladder incontinence and frequency.  Musculoskeletal: Negative for back pain, neck pain and neck stiffness.  Skin: Negative for rash and wound.  Neurological: Positive for light-headedness. Negative for dizziness, vertigo, weakness, numbness and headaches.  Psychiatric/Behavioral: Negative for agitation.  All other systems reviewed and are negative.    Physical Exam Updated Vital Signs BP 98/67 (BP Location: Right Arm)   Pulse 71   Temp 97.6 F (36.4 C) (Oral)   Resp 14   Ht 5\' 10"  (1.778 m)   Wt 95.3 kg   SpO2 98%   BMI 30.13 kg/m   Physical Exam  Constitutional: He is oriented to person, place, and time. He appears well-developed and well-nourished. No distress.  HENT:  Head: Normocephalic and atraumatic.  Mouth/Throat: Oropharynx is clear and moist. No oropharyngeal exudate.  Eyes: No scleral icterus.  Pt is blind   Neck: Neck supple.  Cardiovascular: Normal rate and regular rhythm.  No murmur heard. Pulmonary/Chest: Effort normal. No respiratory distress. He has no wheezes. He has rales. He exhibits no tenderness.  Abdominal: Soft. There is no tenderness. There is no guarding.  Musculoskeletal: He exhibits edema (mild bl LE). He exhibits no tenderness or deformity.  Neurological: He is alert and oriented to person, place, and time.  Skin: Skin is warm  and dry. Capillary refill takes less than 2 seconds. He is not diaphoretic (per EMS he was). No erythema. No pallor.  Psychiatric: He has a normal mood and affect.  Nursing note and vitals reviewed.    ED Treatments / Results  Labs (all labs ordered are listed, but only abnormal results are displayed) Labs Reviewed  BASIC METABOLIC  PANEL - Abnormal; Notable for the following components:      Result Value   Glucose, Bld 141 (*)    Creatinine, Ser 1.59 (*)    GFR calc non Af Amer 38 (*)    GFR calc Af Amer 44 (*)    All other components within normal limits  CBC - Abnormal; Notable for the following components:   Hemoglobin 12.5 (*)    All other components within normal limits  CBG MONITORING, ED - Abnormal; Notable for the following components:   Glucose-Capillary 135 (*)    All other components within normal limits  BRAIN NATRIURETIC PEPTIDE  MAGNESIUM  URINALYSIS, ROUTINE W REFLEX MICROSCOPIC  I-STAT TROPONIN, ED  I-STAT TROPONIN, ED    EKG EKG Interpretation  Date/Time:  Thursday January 23 2018 09:53:10 EDT Ventricular Rate:  71 PR Interval:    QRS Duration: 145 QT Interval:  457 QTC Calculation: 497 R Axis:   -86 Text Interpretation:  Sinus rhythm Prolonged PR interval RBBB and LAFB No STEMI.  Similar to July 2019 tracing.  Confirmed by Alona Bene [phone]) on 01/23/2018 9:58:19 AM Also confirmed by Alona Bene [phone]), editor Barbette Hair [phone])  on 01/23/2018 1:06:58 PM   Radiology Dg Chest 2 View  Result Date: 01/23/2018 CLINICAL DATA:  Syncope today. EXAM: CHEST - 2 VIEW COMPARISON:  Chest x-rays dated 12/08/2017 and 11/10/2017. FINDINGS: Heart size and mediastinal contours are stable. Lungs are clear. No pleural effusion or pneumothorax seen. Stable elevation of the RIGHT hemidiaphragm. Degenerative changes again noted at the LEFT shoulder. RIGHT shoulder arthroplasty hardware incompletely imaged. No acute or suspicious osseous finding. IMPRESSION: No active  cardiopulmonary disease. No evidence of pneumonia or pulmonary edema. Stable cardiomegaly. Electronically Signed   By: Bary Richard M.D.   On: 01/23/2018 10:48    Procedures Procedures (including critical care time)  Medications Ordered in ED Medications  sodium chloride 0.9 % bolus 500 mL (0 mLs Intravenous Stopped 01/23/18 1157)  sodium chloride 0.9 % bolus 1,000 mL (0 mLs Intravenous Stopped 01/23/18 1513)     Initial Impression / Assessment and Plan / ED Course  I have reviewed the triage vital signs and the nursing notes.  Pertinent labs & imaging results that were available during my care of the patient were reviewed by me and considered in my medical decision making (see chart for details).     [name] is a 84 y.o. male with a past medical history significant for CHF, CAD, CKD, diabetes, hyperlipidemia, stroke and blindness who presents with several days of diarrhea and a syncopal episode today.  Patient reports that he has had diarrhea for the last 2 or 3 days.  He reports frequent episodes.  He is blind but thinks it is just watery.  He reports some nausea but no significant vomiting.  He reports some dysuria as well.  He reports no recent antibiotic treatments.  According to EMS, patient syncopized for several minutes on the bath chair.  On EMS arrival, patient was pale and lethargic and diaphoretic.  His blood pressure was 60 over palp and bradycardic in the 50s.  Patient was given fluids and brought to the ED.  On arrival, patient blood pressures have been in the 90s to low 100 range.  Patient reports she is still feeling fatigued and somewhat lightheaded.  He denies any palpitations chest pain or shortness of breath.  On exam, patient does have some mild lower extremity edema.  Lungs had some crackles in the  bases.  Chest was nontender.  Abdomen was nontender.  Patient's initial EKG did not show evidence of STEMI.  Patient did have intermittent bradycardia on telemetry  with rates in the 50s.  Clinically I am concerned that patient is dehydrated from his diarrhea causing him to have a syncopal episode.  Given his history of CHF, and hypotension on scene, this is a high risk syncope.  Patient be given gentle fluids while he is assessed for other abnormalities including fluid overload given the mildly edematous legs and his crackles on lungs.  Also considered symptomatic bradycardia as the cause of his syncope given the rate in the 50s, his CHF and his CAD history.  Next  I anticipate reassessment and likely admission for the patient.  12:37 PM Patient's blood pressure began to improve with some fluids.  Laboratory testing showed acute kidney injury compared to several days ago however it is similar to readings in the past.  Other laboratory testing was reassuring including initially negative troponin and nonelevated BNP.  Chest x-ray does not show fluid overload.  Shared decision making comes and help with patient and he would rather get some more fluids and possibly go home.  We advised him that a syncopal episode with history of CHF and the hypotension he had his high risk however, we decided if patient was feeling better and able to sit up without lightheadedness after 1 more liter of fluids, we would let him go home for outpatient cardiology and PCP follow-up.  Patient is agreeable to this plan.  Anticipate reassessment shortly.  3:12 PM Patient's blood pressure improved during his stay however nursing spoke with family who expressed concern that he has syncopized in the past and they are concerned about him maintaining his hydration at home.  Given the patient's syncopal episode, hypotension, intermittent bradycardia, history of CHF, and the ongoing diarrhea with lightheadedness, we feel it is reasonable to have the patient be admitted for likely echo and further rehydration and monitoring.   Hospitalist team will be called for admission.  3:47 PM Spoke  with hospitalist team who agreed to admit patient under observation.  They did request we speak with cardiology as this appears to be a recurrent admission for syncopal episodes and bradycardia.  Cardiology will be called to come consult on the patient during medicine admission.   Final Clinical Impressions(s) / ED Diagnoses   Final diagnoses:  Syncope, unspecified syncope type  AKI (acute kidney injury) (HCC)  Diarrhea, unspecified type    ED Discharge Orders    None      Clinical Impression: 1. Syncope, unspecified syncope type   2. AKI (acute kidney injury) (HCC)   3. Diarrhea, unspecified type     Disposition: Admit  This note was prepared with assistance of Dragon voice recognition software. Occasional wrong-word or sound-a-like substitutions may have occurred due to the inherent limitations of voice recognition software.     Madelene Kaatz, Canary Brim, MD 01/23/18 2030

## 2018-01-24 DIAGNOSIS — R197 Diarrhea, unspecified: Secondary | ICD-10-CM | POA: Diagnosis not present

## 2018-01-24 DIAGNOSIS — R55 Syncope and collapse: Secondary | ICD-10-CM

## 2018-01-24 LAB — GLUCOSE, CAPILLARY
Glucose-Capillary: 103 mg/dL — ABNORMAL HIGH (ref 70–99)
Glucose-Capillary: 129 mg/dL — ABNORMAL HIGH (ref 70–99)
Glucose-Capillary: 137 mg/dL — ABNORMAL HIGH (ref 70–99)

## 2018-01-24 NOTE — Progress Notes (Signed)
Notified by daughter that she is working on transportation.

## 2018-01-24 NOTE — Care Management Note (Addendum)
Case Management Note  Patient Details  Name: SAMRAT SEALEY MRN: [id number] Date of Birth: 09/18/1930  Subjective/Objective:   Recurrent Syncope                Action/Plan: PCP: Clayborn Heron, MD; has [facility] Medicare with prescription drug coverage; noted recent SNF admission and pt has [facility] services currently, he does not remember which agency; patient gave CM permission to call his daughter / and son; vm left with Kathlene November for the name of HHC agency. Abelino Derrick RN,MHA,BSN  11:50 am- Received call from [facility] with [facility], pt is active with [facility] for HHPT/ OT as prior to admission; B Shelba Flake  Expected Discharge Date:  01/24/18               Expected Discharge Plan:   Home with HHC services  Status of Service:   In progress  Reola Mosher [phone] 01/24/2018, 11:06 AM

## 2018-01-24 NOTE — Discharge Summary (Signed)
PATIENT DETAILS Name: [name] Age: 84 y.o. Sex: male Date of Birth: 1930/12/17 MRN: [id number]. Admitting Physician: Ricky Grates, MD [name], Ricky Dance, MD  Admit Date: 01/23/2018 Discharge date: 01/24/2018  Recommendations for Outpatient Follow-up:  1. Follow up with PCP in 1-2 weeks 2. Please obtain BMP/CBC in one week 3. Consider 30 day monitor if syncope recurrs  Admitted From:  Home  Disposition: Home with home health services    Home Health: Yes  Equipment/Devices: None  Discharge Condition: Stable  CODE STATUS: FULL CODE  Diet recommendation:  Heart Healthy   Brief Summary: See H&P, Labs, Consult and Test reports for all details in brief, patient is a 84 year old male with history of CAD status post PCI in July 2019, chronic systolic heart failure-who presented to the ED following a syncopal episode.  He recently had a diarrheal illness 2-3 episodes of loose stools a day for the past 2 days, he does claim that prior to him passing out-he did have a prodrome of dizziness, lightheadedness and diaphoresis.  Per history, EMS reported a palpated blood pressure of 60.  He was provided IV fluids with restoration of normotension.  He was monitored in telemetry overnight with no significant arrhythmias.  He was thought to have syncope in the setting of either a vasovagal syncope and/or dehydration.  Brief Hospital Course: Syncope: No evidence of arrhythmias and overnight monitoring.  Overall symptomatology is highly suggestive of syncope secondary to dehydration or neurocardiogenic event.  Appreciate cardiology input, stable for discharge-if he continues to have his syncopal episodes-may benefit from a 30-day event monitor.  ?  Acute diarrhea: Few loose stools a few days prior to this hospital stay-has resolved-since this is resolved-no point in pursuing work-up.  CAD status post recent PCI: No anginal symptoms.  Continue antiplatelet agents  Chronic systolic  heart failure: Euvolemic.  Chronic frailty: We will arrange for PT services at home-at baseline walks with a walker.  Rest of the patient's medical issues were stable during the short hospital stay.  Procedures/Studies: None  Discharge Diagnoses:  Active Problems:   Recurrent syncope   Diarrhea   Discharge Instructions:  Activity:  As tolerated with Full fall precautions use walker/cane & assistance as needed   Discharge Instructions    Call MD for:  extreme fatigue   Complete by:  As directed    Call MD for:  persistant dizziness or light-headedness   Complete by:  As directed    Diet - low sodium heart healthy   Complete by:  As directed    Increase activity slowly   Complete by:  As directed      Allergies as of 01/24/2018   No Known Allergies     Medication List    TAKE these medications   acetaminophen 500 MG tablet Commonly known as:  TYLENOL Take 1,000 mg by mouth as needed for mild pain.   aspirin 81 MG EC tablet Take 1 tablet (81 mg total) by mouth daily.   atorvastatin 80 MG tablet Commonly known as:  LIPITOR Take 1 tablet (80 mg total) by mouth daily.   HYDROcodone-acetaminophen 5-325 MG tablet Commonly known as:  NORCO/VICODIN Take 1 tablet by mouth every 4 (four) hours as needed for moderate pain.   ICY HOT ADVANCED RELIEF 16-11 % Crea Generic drug:  Menthol-Camphor Apply 1 application topically as needed (knee pain).   loperamide 2 MG capsule Commonly known as:  IMODIUM Take 2 mg by mouth as needed for diarrhea or  loose stools.   metoprolol succinate 25 MG 24 hr tablet Commonly known as:  TOPROL-XL Take 1 tablet (25 mg total) by mouth daily.   nitroGLYCERIN 0.4 MG SL tablet Commonly known as:  NITROSTAT Place 1 tablet (0.4 mg total) under the tongue every 5 (five) minutes x 3 doses as needed for chest pain.   prednisoLONE acetate 1 % ophthalmic suspension Commonly known as:  PRED FORTE Place 1 drop into the right eye at bedtime.     sertraline 50 MG tablet Commonly known as:  ZOLOFT Take 1 tablet (50 mg total) by mouth at bedtime.   ticagrelor 90 MG Tabs tablet Commonly known as:  BRILINTA Take 1 tablet (90 mg total) by mouth 2 (two) times daily.   timolol 0.5 % ophthalmic solution Commonly known as:  TIMOPTIC Place 1 drop into the right eye daily.   TRAVATAN Z 0.004 % Soln ophthalmic solution Generic drug:  Travoprost (BAK Free) Place 1 drop into the left eye at bedtime.      Follow-up Information    [name], Ricky Dance, MD. Schedule an appointment as soon as possible for a visit in 1 week(s).   Specialty:  Family Medicine Contact information: [address] [place] [address] [phone]        [name], Ricky Hora, MD. Schedule an appointment as soon as possible for a visit in 1 week(s).   Specialty:  Cardiology Contact information: [address] [place] [address] [phone]          No Known Allergies  Consultations:   cardiology  Other Procedures/Studies: Dg Chest 2 View  Result Date: 01/23/2018 CLINICAL DATA:  Syncope today. EXAM: CHEST - 2 VIEW COMPARISON:  Chest x-rays dated 12/08/2017 and 11/10/2017. FINDINGS: Heart size and mediastinal contours are stable. Lungs are clear. No pleural effusion or pneumothorax seen. Stable elevation of the RIGHT hemidiaphragm. Degenerative changes again noted at the LEFT shoulder. RIGHT shoulder arthroplasty hardware incompletely imaged. No acute or suspicious osseous finding. IMPRESSION: No active cardiopulmonary disease. No evidence of pneumonia or pulmonary edema. Stable cardiomegaly. Electronically Signed   By: Bary Richard M.D.   On: 01/23/2018 10:48      TODAY-DAY OF DISCHARGE:  Subjective:   [name] today has no headache,no chest abdominal pain,no new weakness tingling or numbness, feels much better wants to go home today.  Objective:   Blood pressure (!) 150/88, pulse 72, temperature 98.2 F (36.8  C), temperature source Oral, resp. rate 17, height 5\' 10"  (1.778 m), weight 97.5 kg, SpO2 100 %.  Intake/Output Summary (Last 24 hours) at 01/24/2018 0932 Last data filed at 01/24/2018 5784 Gross per 24 hour  Intake 240 ml  Output 275 ml  Net -35 ml   Filed Weights   01/23/18 0954 01/23/18 1820 01/24/18 0714  Weight: 95.3 kg 96.3 kg 97.5 kg    Exam: Awake Alert, Oriented *3, No new F.N deficits, Normal affect [place].AT,PERRAL Supple Neck,No JVD, No cervical lymphadenopathy appriciated.  Symmetrical Chest wall movement, Good air movement bilaterally, CTAB RRR,No Gallops,Rubs or new Murmurs, No Parasternal Heave +ve B.Sounds, Abd Soft, Non tender, No organomegaly appriciated, No rebound -guarding or rigidity. No Cyanosis, Clubbing or edema, No new Rash or bruise   PERTINENT RADIOLOGIC STUDIES: Dg Chest 2 View  Result Date: 01/23/2018 CLINICAL DATA:  Syncope today. EXAM: CHEST - 2 VIEW COMPARISON:  Chest x-rays dated 12/08/2017 and 11/10/2017. FINDINGS: Heart size and mediastinal contours are stable. Lungs are clear. No pleural effusion or pneumothorax  seen. Stable elevation of the RIGHT hemidiaphragm. Degenerative changes again noted at the LEFT shoulder. RIGHT shoulder arthroplasty hardware incompletely imaged. No acute or suspicious osseous finding. IMPRESSION: No active cardiopulmonary disease. No evidence of pneumonia or pulmonary edema. Stable cardiomegaly. Electronically Signed   By: Bary Richard M.D.   On: 01/23/2018 10:48     PERTINENT LAB RESULTS: CBC: Recent Labs    01/23/18 1010  WBC 6.6  HGB 12.5*  HCT 40.3  PLT 253   CMET CMP     Component Value Date/Time   NA 138 01/23/2018 1010   NA 139 01/21/2018 1201   K 4.4 01/23/2018 1010   CL 107 01/23/2018 1010   CO2 23 01/23/2018 1010   GLUCOSE 141 (H) 01/23/2018 1010   BUN 17 01/23/2018 1010   BUN 15 01/21/2018 1201   CREATININE 1.59 (H) 01/23/2018 1010   CALCIUM 9.2 01/23/2018 1010   PROT 7.5 12/16/2017 0403    ALBUMIN 3.7 12/16/2017 0403   AST 20 12/16/2017 0403   ALT 19 12/16/2017 0403   ALKPHOS 87 12/16/2017 0403   BILITOT 0.8 12/16/2017 0403   GFRNONAA 38 (L) 01/23/2018 1010   GFRAA 44 (L) 01/23/2018 1010    GFR Estimated Creatinine Clearance: 40.5 mL/min (A) (by C-G formula based on SCr of 1.59 mg/dL (H)). No results for input(s): LIPASE, AMYLASE in the last 72 hours. No results for input(s): CKTOTAL, CKMB, CKMBINDEX, TROPONINI in the last 72 hours. Invalid input(s): POCBNP No results for input(s): DDIMER in the last 72 hours. No results for input(s): HGBA1C in the last 72 hours. No results for input(s): CHOL, HDL, LDLCALC, TRIG, CHOLHDL, LDLDIRECT in the last 72 hours. No results for input(s): TSH, T4TOTAL, T3FREE, THYROIDAB in the last 72 hours.  Invalid input(s): FREET3 No results for input(s): VITAMINB12, FOLATE, FERRITIN, TIBC, IRON, RETICCTPCT in the last 72 hours. Coags: No results for input(s): INR in the last 72 hours.  Invalid input(s): PT Microbiology: No results found for this or any previous visit (from the past 240 hour(s)).  FURTHER DISCHARGE INSTRUCTIONS:  Get Medicines reviewed and adjusted: Please take all your medications with you for your next visit with your Primary MD  Laboratory/radiological data: Please request your Primary MD to go over all hospital tests and procedure/radiological results at the follow up, please ask your Primary MD to get all Hospital records sent to his/her office.  In some cases, they will be blood work, cultures and biopsy results pending at the time of your discharge. Please request that your primary care M.D. goes through all the records of your hospital data and follows up on these results.  Also Note the following: If you experience worsening of your admission symptoms, develop shortness of breath, life threatening emergency, suicidal or homicidal thoughts you must seek medical attention immediately by calling 911 or calling your  MD immediately  if symptoms less severe.  You must read complete instructions/literature along with all the possible adverse reactions/side effects for all the Medicines you take and that have been prescribed to you. Take any new Medicines after you have completely understood and accpet all the possible adverse reactions/side effects.   Do not drive when taking Pain medications or sleeping medications (Benzodaizepines)  Do not take more than prescribed Pain, Sleep and Anxiety Medications. It is not advisable to combine anxiety,sleep and pain medications without talking with your primary care practitioner  Special Instructions: If you have smoked or chewed Tobacco  in the last 2 yrs please stop  smoking, stop any regular Alcohol  and or any Recreational drug use.  Wear Seat belts while driving.  Please note: You were cared for by a hospitalist during your hospital stay. Once you are discharged, your primary care physician will handle any further medical issues. Please note that NO REFILLS for any discharge medications will be authorized once you are discharged, as it is imperative that you return to your primary care physician (or establish a relationship with a primary care physician if you do not have one) for your post hospital discharge needs so that they can reassess your need for medications and monitor your lab values.  Total Time spent coordinating discharge including counseling, education and face to face time equals 25  minutes.  SignedJeoffrey Massed 01/24/2018 9:32 AM

## 2018-01-24 NOTE — Progress Notes (Signed)
Per daughter son is on the way to pick up Mr. [name]  Ruth Kovich, RN

## 2018-01-24 NOTE — Progress Notes (Signed)
Per Case Manager Steward Drone, son is coming with clothes to help with transportation. IV an Cardiac Box removed, paperwork given, patient verbalized understanding.  Ready to be d/c  Bettey Mare, RN

## 2018-01-24 NOTE — Progress Notes (Signed)
Brief cardiology follow up note  No events seen on telemetry overnight. Rhythm is sinus throughout. No pauses.  No high risk patterns noted for a cardiac cause of his syncope. Can consider 30 day monitor, but pattern appears consistent with orthostatic syncope due to dehydration from diarrhea.  [facility] will sign off in anticipation of discharge.   Medication Recommendations:  No diuretics Other recommendations (labs, testing, etc):  Consider 30 day monitor Follow up as an outpatient:  As needed.  Jodelle Red, MD, PhD [facility]  [address] [place], [place] [address] [phone]

## 2018-01-24 NOTE — Evaluation (Signed)
Physical Therapy Evaluation Patient Details Name: [name] MRN: [id number] DOB: 04/13/1931 Today's Date: 01/24/2018   History of Present Illness  [name] is a 84 y.o. male diet controlled DM2, macular degeneration,  legal blindness , HLD, remote lacunar infracts , h/o CAD s/p MI with DES to OM2 and Lcx with staged DESx2 to the RCA in 12/2017, chronic systolic HF (45%)  , he is brought to the ED by EMS due to syncope episode after having diarrhea,  Clinical Impression  Patient presents with decreased mobility due to recent syncope and not in his usual environment.  Feel he will benefit from skilled PT in the acute setting and follow up HHPT at d/c.  Orthostatics taken this session with results below.  Orthostatic VS for the past 24 hrs (Last 3 readings):  BP- Lying Pulse- Lying BP- Sitting Pulse- Sitting BP- Standing at 0 minutes Pulse- Standing at 0 minutes BP- Standing at 3 minutes Pulse- Standing at 3 minutes  01/24/18 0900 131/78 72 146/86 79 (!) 149/97 87 140/87 90      Follow Up Recommendations Home health PT;Supervision - Intermittent(initial 24 hour assist ideal)    Equipment Recommendations  None recommended by PT    Recommendations for Other Services       Precautions / Restrictions Precautions Precautions: Fall Precaution Comments: legally blind Restrictions Weight Bearing Restrictions: No      Mobility  Bed Mobility Overal bed mobility: Needs Assistance Bed Mobility: Supine to Sit     Supine to sit: Supervision;HOB elevated     General bed mobility comments: assist for safety; monitoring BP  Transfers Overall transfer level: Needs assistance Equipment used: Rolling walker (2 wheeled) Transfers: Sit to/from Stand Sit to Stand: Supervision         General transfer comment: increased time, no phyiscal assist  Ambulation/Gait Ambulation/Gait assistance: Min assist;Supervision Gait Distance (Feet): 20 Feet(x 2) Assistive device: Rolling  walker (2 wheeled) Gait Pattern/deviations: Step-through pattern;Decreased stride length;Trunk flexed     General Gait Details: occasional assist to maneuver around obstacles in room, for side stepping into bathroom and walker safety; able to walk in open space without physical assist  Stairs            Wheelchair Mobility    Modified Rankin (Stroke Patients Only)       Balance Overall balance assessment: Needs assistance   Sitting balance-Leahy Scale: Good     Standing balance support: Bilateral upper extremity supported;During functional activity Standing balance-Leahy Scale: Poor Standing balance comment: assist with hygiene in bathroom due to diarrhea while standing for orthostatics                             Pertinent Vitals/Pain Pain Assessment: No/denies pain    Home Living Family/patient expects to be discharged to:: Private residence Living Arrangements: Children Available Help at Discharge: Family;Available PRN/intermittently Type of Home: House Home Access: Stairs to enter Entrance Stairs-Rails: Right Entrance Stairs-Number of Steps: 3 in front, 7 in back Home Layout: One level Home Equipment: Walker - 4 wheels;Cane - single point;Shower seat;Grab bars - toilet;Grab bars - tub/shower;Wheelchair - manual      Prior Function Level of Independence: Needs assistance   Gait / Transfers Assistance Needed: uses walker  ADL's / Homemaking Assistance Needed: son assists wtih showering        Hand Dominance   Dominant Hand: Right    Extremity/Trunk Assessment   Upper Extremity Assessment  Upper Extremity Assessment: Overall WFL for tasks assessed    Lower Extremity Assessment Lower Extremity Assessment: Overall WFL for tasks assessed       Communication   Communication: No difficulties  Cognition Arousal/Alertness: Awake/alert Behavior During Therapy: WFL for tasks assessed/performed Overall Cognitive Status: Within Functional  Limits for tasks assessed                                        General Comments      Exercises     Assessment/Plan    PT Assessment Patient needs continued PT services  PT Problem List Decreased mobility;Decreased strength;Decreased knowledge of use of DME       PT Treatment Interventions DME instruction;Therapeutic activities;Gait training;Patient/family education;Balance training;Functional mobility training;Stair training    PT Goals (Current goals can be found in the Care Plan section)  Acute Rehab PT Goals Patient Stated Goal: to go home PT Goal Formulation: With patient Time For Goal Achievement: 01/31/18 Potential to Achieve Goals: Good    Frequency Min 3X/week   Barriers to discharge        Co-evaluation               AM-PAC PT "6 Clicks" Daily Activity  Outcome Measure Difficulty turning over in bed (including adjusting bedclothes, sheets and blankets)?: A Little Difficulty moving from lying on back to sitting on the side of the bed? : A Little Difficulty sitting down on and standing up from a chair with arms (e.g., wheelchair, bedside commode, etc,.)?: A Little Help needed moving to and from a bed to chair (including a wheelchair)?: A Little Help needed walking in hospital room?: A Little Help needed climbing 3-5 steps with a railing? : A Little 6 Click Score: 18    End of Session Equipment Utilized During Treatment: Gait belt Activity Tolerance: Patient tolerated treatment well Patient left: with call bell/phone within reach;in chair;with chair alarm set   PT Visit Diagnosis: Other abnormalities of gait and mobility (R26.89);Muscle weakness (generalized) (M62.81)    Time: 6073-7106 PT Time Calculation (min) (ACUTE ONLY): 33 min   Charges:   PT Evaluation $PT Eval Moderate Complexity: 1 Mod PT Treatments $Gait Training: 8-22 mins        [place], [place] [phone] 01/24/2018   [name] 01/24/2018, 10:31 AM

## 2018-01-24 NOTE — Progress Notes (Signed)
Patient discharged: Home with family/son  Via: Wheelchair   Discharge paperwork given: to patient and family  Reviewed with teach back  IV and telemetry disconnected  Belongings given to patient

## 2018-01-24 NOTE — Progress Notes (Signed)
Tried to contacted daughter and son since patient stated that they are the only available to come and pick him up. Called son, no response, left voice mail. Called daughter, she stated she will call and arrange with brother for someone to come and pick him up. She will call back.  Graeden Bitner, RN

## 2018-01-25 LAB — URINE CULTURE: Culture: >=100000 — AB

## 2018-01-28 LAB — CULTURE, BLOOD (ROUTINE X 2)
Culture: NO GROWTH
Culture: NO GROWTH

## 2018-02-04 ENCOUNTER — Emergency Department (HOSPITAL_COMMUNITY): Payer: Medicare Other

## 2018-02-04 ENCOUNTER — Encounter (HOSPITAL_COMMUNITY): Payer: Self-pay

## 2018-02-04 ENCOUNTER — Inpatient Hospital Stay (HOSPITAL_COMMUNITY)
Admission: EM | Admit: 2018-02-04 | Discharge: 2018-02-12 | DRG: 689 | Disposition: A | Discharge status: Skilled Nursing Facility | Payer: Medicare Other | Attending: Internal Medicine | Admitting: Internal Medicine

## 2018-02-04 DIAGNOSIS — W08XXXA Fall from other furniture, initial encounter: Secondary | ICD-10-CM | POA: Diagnosis present

## 2018-02-04 DIAGNOSIS — Z7982 Long term (current) use of aspirin: Secondary | ICD-10-CM

## 2018-02-04 DIAGNOSIS — S329XXA Fracture of unspecified parts of lumbosacral spine and pelvis, initial encounter for closed fracture: Secondary | ICD-10-CM | POA: Diagnosis present

## 2018-02-04 DIAGNOSIS — I130 Hypertensive heart and chronic kidney disease with heart failure and stage 1 through stage 4 chronic kidney disease, or unspecified chronic kidney disease: Secondary | ICD-10-CM | POA: Diagnosis not present

## 2018-02-04 DIAGNOSIS — Z23 Encounter for immunization: Secondary | ICD-10-CM

## 2018-02-04 DIAGNOSIS — E1121 Type 2 diabetes mellitus with diabetic nephropathy: Secondary | ICD-10-CM | POA: Diagnosis present

## 2018-02-04 DIAGNOSIS — I2510 Atherosclerotic heart disease of native coronary artery without angina pectoris: Secondary | ICD-10-CM | POA: Diagnosis present

## 2018-02-04 DIAGNOSIS — Z96611 Presence of right artificial shoulder joint: Secondary | ICD-10-CM | POA: Diagnosis present

## 2018-02-04 DIAGNOSIS — M5136 Other intervertebral disc degeneration, lumbar region: Secondary | ICD-10-CM | POA: Diagnosis not present

## 2018-02-04 DIAGNOSIS — G4700 Insomnia, unspecified: Secondary | ICD-10-CM | POA: Diagnosis not present

## 2018-02-04 DIAGNOSIS — Z6830 Body mass index (BMI) 30.0-30.9, adult: Secondary | ICD-10-CM

## 2018-02-04 DIAGNOSIS — I5042 Chronic combined systolic (congestive) and diastolic (congestive) heart failure: Secondary | ICD-10-CM | POA: Diagnosis present

## 2018-02-04 DIAGNOSIS — E785 Hyperlipidemia, unspecified: Secondary | ICD-10-CM | POA: Diagnosis not present

## 2018-02-04 DIAGNOSIS — G9340 Encephalopathy, unspecified: Secondary | ICD-10-CM

## 2018-02-04 DIAGNOSIS — I10 Essential (primary) hypertension: Secondary | ICD-10-CM

## 2018-02-04 DIAGNOSIS — M109 Gout, unspecified: Secondary | ICD-10-CM | POA: Diagnosis not present

## 2018-02-04 DIAGNOSIS — Z79899 Other long term (current) drug therapy: Secondary | ICD-10-CM

## 2018-02-04 DIAGNOSIS — Y92008 Other place in unspecified non-institutional (private) residence as the place of occurrence of the external cause: Secondary | ICD-10-CM

## 2018-02-04 DIAGNOSIS — Z8673 Personal history of transient ischemic attack (TIA), and cerebral infarction without residual deficits: Secondary | ICD-10-CM

## 2018-02-04 DIAGNOSIS — N183 Chronic kidney disease, stage 3 (moderate): Secondary | ICD-10-CM | POA: Diagnosis present

## 2018-02-04 DIAGNOSIS — Z794 Long term (current) use of insulin: Secondary | ICD-10-CM

## 2018-02-04 DIAGNOSIS — F0390 Unspecified dementia without behavioral disturbance: Secondary | ICD-10-CM | POA: Diagnosis present

## 2018-02-04 DIAGNOSIS — Z87891 Personal history of nicotine dependence: Secondary | ICD-10-CM

## 2018-02-04 DIAGNOSIS — G4730 Sleep apnea, unspecified: Secondary | ICD-10-CM | POA: Diagnosis not present

## 2018-02-04 DIAGNOSIS — R2689 Other abnormalities of gait and mobility: Secondary | ICD-10-CM | POA: Diagnosis not present

## 2018-02-04 DIAGNOSIS — S32591A Other specified fracture of right pubis, initial encounter for closed fracture: Secondary | ICD-10-CM | POA: Diagnosis present

## 2018-02-04 DIAGNOSIS — I252 Old myocardial infarction: Secondary | ICD-10-CM | POA: Diagnosis not present

## 2018-02-04 DIAGNOSIS — B961 Klebsiella pneumoniae [K. pneumoniae] as the cause of diseases classified elsewhere: Secondary | ICD-10-CM | POA: Diagnosis present

## 2018-02-04 DIAGNOSIS — N390 Urinary tract infection, site not specified: Principal | ICD-10-CM

## 2018-02-04 DIAGNOSIS — E1122 Type 2 diabetes mellitus with diabetic chronic kidney disease: Secondary | ICD-10-CM | POA: Diagnosis not present

## 2018-02-04 DIAGNOSIS — H409 Unspecified glaucoma: Secondary | ICD-10-CM | POA: Diagnosis present

## 2018-02-04 DIAGNOSIS — G9341 Metabolic encephalopathy: Secondary | ICD-10-CM | POA: Diagnosis present

## 2018-02-04 DIAGNOSIS — Z955 Presence of coronary angioplasty implant and graft: Secondary | ICD-10-CM

## 2018-02-04 DIAGNOSIS — H3530 Unspecified macular degeneration: Secondary | ICD-10-CM | POA: Diagnosis present

## 2018-02-04 DIAGNOSIS — E669 Obesity, unspecified: Secondary | ICD-10-CM | POA: Diagnosis present

## 2018-02-04 DIAGNOSIS — Z8719 Personal history of other diseases of the digestive system: Secondary | ICD-10-CM

## 2018-02-04 DIAGNOSIS — M8580 Other specified disorders of bone density and structure, unspecified site: Secondary | ICD-10-CM | POA: Diagnosis present

## 2018-02-04 LAB — URINALYSIS, ROUTINE W REFLEX MICROSCOPIC
Bilirubin Urine: NEGATIVE
Glucose, UA: NEGATIVE mg/dL
Ketones, ur: NEGATIVE mg/dL
Nitrite: POSITIVE — AB
Protein, ur: NEGATIVE mg/dL
Specific Gravity, Urine: 1.015 (ref 1.005–1.030)
pH: 5 (ref 5.0–8.0)

## 2018-02-04 LAB — CBC WITH DIFFERENTIAL/PLATELET
Abs Immature Granulocytes: 0 10*3/uL (ref 0.0–0.1)
Basophils Absolute: 0 10*3/uL (ref 0.0–0.1)
Basophils Relative: 1 %
Eosinophils Absolute: 0.5 10*3/uL (ref 0.0–0.7)
Eosinophils Relative: 9 %
HCT: 35.7 % — ABNORMAL LOW (ref 39.0–52.0)
Hemoglobin: 11.1 g/dL — ABNORMAL LOW (ref 13.0–17.0)
Immature Granulocytes: 1 %
Lymphocytes Relative: 20 %
Lymphs Abs: 1.1 10*3/uL (ref 0.7–4.0)
MCH: 28 pg (ref 26.0–34.0)
MCHC: 31.1 g/dL (ref 30.0–36.0)
MCV: 89.9 fL (ref 78.0–100.0)
Monocytes Absolute: 0.5 10*3/uL (ref 0.1–1.0)
Monocytes Relative: 10 %
Neutro Abs: 3.3 10*3/uL (ref 1.7–7.7)
Neutrophils Relative %: 59 %
Platelets: 185 10*3/uL (ref 150–400)
RBC: 3.97 MIL/uL — ABNORMAL LOW (ref 4.22–5.81)
RDW: 14.9 % (ref 11.5–15.5)
WBC: 5.4 10*3/uL (ref 4.0–10.5)

## 2018-02-04 LAB — BASIC METABOLIC PANEL
Anion gap: 8 (ref 5–15)
BUN: 14 mg/dL (ref 8–23)
CO2: 23 mmol/L (ref 22–32)
Calcium: 9 mg/dL (ref 8.9–10.3)
Chloride: 108 mmol/L (ref 98–111)
Creatinine, Ser: 1.31 mg/dL — ABNORMAL HIGH (ref 0.61–1.24)
GFR calc Af Amer: 56 mL/min — ABNORMAL LOW (ref >60)
GFR calc non Af Amer: 48 mL/min — ABNORMAL LOW (ref >60)
Glucose, Bld: 105 mg/dL — ABNORMAL HIGH (ref 70–99)
Potassium: 3.8 mmol/L (ref 3.5–5.1)
Sodium: 139 mmol/L (ref 135–145)

## 2018-02-04 LAB — URINALYSIS, MICROSCOPIC (REFLEX): WBC, UA: >50 WBC/hpf (ref 0–5)

## 2018-02-04 LAB — PROTIME-INR
INR: 1.09
Prothrombin Time: 14 seconds (ref 11.4–15.2)

## 2018-02-04 LAB — I-STAT TROPONIN, ED: Troponin i, poc: 0.03 ng/mL (ref 0.00–0.08)

## 2018-02-04 LAB — GLUCOSE, CAPILLARY: Glucose-Capillary: 154 mg/dL — ABNORMAL HIGH (ref 70–99)

## 2018-02-04 MED ORDER — INSULIN ASPART 100 UNIT/ML ~~LOC~~ SOLN
0.0000 [IU] | Freq: Three times a day (TID) | SUBCUTANEOUS | Status: DC
  Administered 2018-02-05 – 2018-02-06 (×2): 2 [IU] via SUBCUTANEOUS
  Administered 2018-02-06 – 2018-02-07 (×2): 1 [IU] via SUBCUTANEOUS
  Administered 2018-02-08 – 2018-02-09 (×2): 2 [IU] via SUBCUTANEOUS
  Administered 2018-02-10 – 2018-02-12 (×3): 1 [IU] via SUBCUTANEOUS
  Administered 2018-02-12: 2 [IU] via SUBCUTANEOUS

## 2018-02-04 MED ORDER — ONDANSETRON HCL 4 MG PO TABS: 4.0000 mg | ORAL_TABLET | Freq: Four times a day (QID) | ORAL | Status: DC | PRN

## 2018-02-04 MED ORDER — SODIUM CHLORIDE 0.9 % IV BOLUS
500.0000 mL | Freq: Once | INTRAVENOUS | Status: AC
  Administered 2018-02-04: 500 mL via INTRAVENOUS

## 2018-02-04 MED ORDER — ATORVASTATIN CALCIUM 80 MG PO TABS
80.0000 mg | ORAL_TABLET | Freq: Every day | ORAL | Status: DC
  Administered 2018-02-04 – 2018-02-12 (×9): 80 mg via ORAL
  Filled 2018-02-04 (×10): qty 1

## 2018-02-04 MED ORDER — ENOXAPARIN SODIUM 40 MG/0.4ML ~~LOC~~ SOLN
40.0000 mg | SUBCUTANEOUS | Status: DC
  Administered 2018-02-04 – 2018-02-11 (×7): 40 mg via SUBCUTANEOUS
  Filled 2018-02-04 (×8): qty 0.4

## 2018-02-04 MED ORDER — SERTRALINE HCL 50 MG PO TABS
50.0000 mg | ORAL_TABLET | Freq: Every day | ORAL | Status: DC
  Administered 2018-02-04 – 2018-02-11 (×8): 50 mg via ORAL
  Filled 2018-02-04 (×8): qty 1

## 2018-02-04 MED ORDER — TIMOLOL MALEATE 0.5 % OP SOLN
1.0000 [drp] | Freq: Every day | OPHTHALMIC | Status: DC
  Administered 2018-02-04 – 2018-02-12 (×9): 1 [drp] via OPHTHALMIC
  Filled 2018-02-04: qty 5

## 2018-02-04 MED ORDER — SODIUM CHLORIDE 0.9 % IV SOLN
1.0000 g | INTRAVENOUS | Status: DC
  Administered 2018-02-04 – 2018-02-10 (×7): 1 g via INTRAVENOUS
  Filled 2018-02-04 (×7): qty 10

## 2018-02-04 MED ORDER — ONDANSETRON HCL 4 MG/2ML IJ SOLN: 4.0000 mg | Freq: Four times a day (QID) | INTRAMUSCULAR | Status: DC | PRN

## 2018-02-04 MED ORDER — CEPHALEXIN 250 MG PO CAPS
500.0000 mg | ORAL_CAPSULE | Freq: Once | ORAL | Status: AC
  Administered 2018-02-04: 500 mg via ORAL
  Filled 2018-02-04: qty 2

## 2018-02-04 MED ORDER — MENTHOL-CAMPHOR 16-11 % EX CREA: 1.0000 "application " | TOPICAL_CREAM | CUTANEOUS | Status: DC | PRN

## 2018-02-04 MED ORDER — NITROGLYCERIN 0.4 MG SL SUBL: 0.4000 mg | SUBLINGUAL_TABLET | SUBLINGUAL | Status: DC | PRN

## 2018-02-04 MED ORDER — SODIUM CHLORIDE 0.9 % IV SOLN
INTRAVENOUS | Status: DC
  Administered 2018-02-04 – 2018-02-11 (×7): via INTRAVENOUS

## 2018-02-04 MED ORDER — METOPROLOL SUCCINATE ER 25 MG PO TB24
25.0000 mg | ORAL_TABLET | Freq: Every day | ORAL | Status: DC
  Administered 2018-02-04 – 2018-02-12 (×9): 25 mg via ORAL
  Filled 2018-02-04 (×9): qty 1

## 2018-02-04 MED ORDER — LATANOPROST 0.005 % OP SOLN
1.0000 [drp] | Freq: Every day | OPHTHALMIC | Status: DC
  Administered 2018-02-04 – 2018-02-11 (×8): 1 [drp] via OPHTHALMIC
  Filled 2018-02-04: qty 2.5

## 2018-02-04 MED ORDER — PREDNISOLONE ACETATE 1 % OP SUSP
1.0000 [drp] | Freq: Every day | OPHTHALMIC | Status: DC
  Administered 2018-02-04 – 2018-02-11 (×8): 1 [drp] via OPHTHALMIC
  Filled 2018-02-04: qty 5

## 2018-02-04 MED ORDER — MORPHINE SULFATE (PF) 2 MG/ML IV SOLN: 1.0000 mg | INTRAVENOUS | Status: DC | PRN

## 2018-02-04 MED ORDER — ACETAMINOPHEN 325 MG PO TABS
650.0000 mg | ORAL_TABLET | Freq: Four times a day (QID) | ORAL | Status: DC | PRN
  Filled 2018-02-04: qty 2

## 2018-02-04 MED ORDER — TICAGRELOR 90 MG PO TABS
90.0000 mg | ORAL_TABLET | Freq: Two times a day (BID) | ORAL | Status: DC
  Administered 2018-02-04 – 2018-02-12 (×16): 90 mg via ORAL
  Filled 2018-02-04 (×17): qty 1

## 2018-02-04 MED ORDER — HYDROCODONE-ACETAMINOPHEN 5-325 MG PO TABS
1.0000 | ORAL_TABLET | Freq: Once | ORAL | Status: AC
  Administered 2018-02-04: 1 via ORAL
  Filled 2018-02-04: qty 1

## 2018-02-04 MED ORDER — HYDROCODONE-ACETAMINOPHEN 5-325 MG PO TABS
1.0000 | ORAL_TABLET | ORAL | Status: DC | PRN
  Administered 2018-02-05: 1 via ORAL
  Filled 2018-02-04: qty 1

## 2018-02-04 MED ORDER — MUSCLE RUB 10-15 % EX CREA
TOPICAL_CREAM | CUTANEOUS | Status: DC | PRN
  Administered 2018-02-04 – 2018-02-05 (×2): via TOPICAL
  Filled 2018-02-04: qty 85

## 2018-02-04 MED ORDER — ACETAMINOPHEN 650 MG RE SUPP: 650.0000 mg | Freq: Four times a day (QID) | RECTAL | Status: DC | PRN

## 2018-02-04 NOTE — H&P (Signed)
History and Physical    [name] ZOX:[id number] DOB: 1931-01-31 DOA: 02/04/2018  PCP: Clayborn Heron, MD   Patient coming from:  Home   Chief Complaint: confusion and ambulatory dysfunction  HPI: [name] is a 84 y.o. male with medical history significant of coronary artery disease status post PCI (July 2019), chronic systolic heart failure, blindness, type 2 diabetes mellitus, dyslipidemia and obesity.  Apparently patient was at his usual state of health, he was sitting in his couch, and while trying to get up he lost his balance and fell.  Post trauma he experienced significant right hip pain to the point where he could not ambulate. The pain was 10 out of 10 in intensity, sharp in nature, no radiation, worse with movement, no improving factors.  He was noted to be confused by his son and he was brought into the hospital for further evaluation.  Patient denies any dysuria, increased urinary frequency, nausea or vomiting.  ED Course: Patient was diagnosed with a urinary tract infection and a non-displaced fracture of the right superior pubic ramus.  He received IV fluids and IV antibiotics.  Referred for admission for further evaluation.  Review of Systems:  1. General: No fevers, no chills, no weight gain or weight loss 2. ENT: No runny nose or sore throat, no hearing disturbances 3. Pulmonary: No dyspnea, cough, wheezing, or hemoptysis 4. Cardiovascular: No angina, claudication, lower extremity edema, pnd or orthopnea 5. Gastrointestinal: No nausea or vomiting, no diarrhea or constipation 6. Hematology: No easy bruisability or frequent infections 7. Urology: No dysuria, hematuria or increased urinary frequency 8. Dermatology: No rashes. 9. Neurology: No seizures or paresthesias 10. Musculoskeletal: Positive right hip pain as mentioned in the history of present illness.   Past Medical History:  Diagnosis Date  . Arthritis    "all over" (11/12/2017)  . CAD  (coronary artery disease) 11/11/2017   S/p IL STEMI 6/19:  S/p DES to pLCx and DES x 2 to OM2 // staged PCI 6/19:  DES x 2 to RCA // residual CAD at cath 6/19:  mLAD 80, dLAD 90, D1 95 tx medically // EF 40-45 by echo  . Chronic combined systolic and diastolic CHF (congestive heart failure) (HCC)    ischemic CM // s/p MI in 11/2017 >> Echo 6/19:  Moderate concentric LVH, EF 40-45, grade 1 diastolic dysfunction, inferior, inferolateral, apical inferior akinesis   . Chronic lower back pain   . CKD (chronic kidney disease), stage III (HCC)   . Constipation    takes an OTC med as needed  . Diabetic nephropathy (HCC)   . Gastric ulcer 1970s  . Glaucoma, both eyes    uses eye drops daily  . Gout    takes Allopurinol daily  . History of colon polyps   . History of ST elevation myocardial infarction (STEMI) 11/10/2017   PCI to LCx and staged PCI to RCA  . History of stress test    a. 09/2014 MV: EF 53%, no ischemia/infarct.  . Hyperlipidemia    takes Pravastatin daily  . Hypertension    takes Amlodipine and Losartan daily  . Insomnia    uses OTC sleep aide  . Joint pain   . Joint swelling    right knee   . Legally blind   . Macular degeneration   . Morbid obesity (HCC)   . Nocturia   . Peripheral edema   . Pleurisy 20+yrs ago  . Pneumonia    "I think  once when I was a kid" (11/12/2017)  . Sleep apnea   . Stroke [facility])    "I've had them; don't know anything about them"; denies residual on 11/12/2017)  . Syncope    a. 09/2014 in setting of orthostasis; b. 10/2014 Event monitor: 19 beat run of asymp NSVT; c. 01/2015 Echo: EF 60-65%, no rwma.  . Type II diabetes mellitus (HCC)    takes Metformin daily  . Urinary frequency    takes Flomax daily    Past Surgical History:  Procedure Laterality Date  . BACK SURGERY    . CATARACT EXTRACTION W/ INTRAOCULAR LENS  IMPLANT, BILATERAL Bilateral   . COLONOSCOPY    . CORONARY ANGIOGRAPHY N/A 11/12/2017   Procedure: CORONARY ANGIOGRAPHY;  Surgeon:  Lennette Bihari, MD;  Location: [facility] INVASIVE CV LAB;  Service: Cardiovascular;  Laterality: N/A;  . CORONARY STENT INTERVENTION N/A 11/12/2017   Procedure: CORONARY STENT INTERVENTION;  Surgeon: Lennette Bihari, MD;  Location: [facility] INVASIVE CV LAB;  Service: Cardiovascular;  Laterality: N/A;  . CORONARY/GRAFT ACUTE MI REVASCULARIZATION N/A 11/10/2017   Procedure: Coronary/Graft Acute MI Revascularization;  Surgeon: Lennette Bihari, MD;  Location: [facility] INVASIVE CV LAB;  Service: Cardiovascular;  Laterality: N/A;  . EYE SURGERY    . GLAUCOMA SURGERY Bilateral   . INSERTION / PLACEMENT / REVISION NEUROSTIMULATOR  2000s  . KNEE ARTHROSCOPY Right 1990s  . LEFT HEART CATH AND CORONARY ANGIOGRAPHY N/A 11/10/2017   Procedure: LEFT HEART CATH AND CORONARY ANGIOGRAPHY;  Surgeon: Lennette Bihari, MD;  Location: [facility] INVASIVE CV LAB;  Service: Cardiovascular;  Laterality: N/A;  . MINI SHUNT INSERTION  09/26/2011   Procedure: INSERTION OF MINI SHUNT;  Surgeon: Chalmers Guest, MD;  Location: [facility] OR;  Service: Ophthalmology;  Laterality: Right;  Insertion of Ahmed shunt  . POSTERIOR FUSION LUMBAR SPINE  1990s  . REVERSE SHOULDER ARTHROPLASTY Right 09/21/2013   Procedure: RIGHT  SHOULDER ARTHROPLASTY REVERSE ;  Surgeon: [name], MD;  Location: [facility] OR;  Service: Orthopedics;  Laterality: Right;  . SHOULDER INJECTION Left 09/21/2013   Procedure: SHOULDER INJECTION;  Surgeon: [name], MD;  Location: [facility] OR;  Service: Orthopedics;  Laterality: Left;     reports that he has quit smoking. His smoking use included cigarettes. He smoked 2.00 packs per day. He has never used smokeless tobacco. He reports that he drank alcohol. He reports that he does not use drugs.  No Known Allergies  Family History  Problem Relation Age of Onset  . Cancer Mother   . Alzheimer's disease Maternal Aunt   . Anesthesia problems Neg Hx     Prior to Admission medications   Medication Sig Start Date End Date Taking? Authorizing  Provider  acetaminophen (TYLENOL) 500 MG tablet Take 1,000 mg by mouth as needed for mild pain.    [provider]  aspirin EC 81 MG EC tablet Take 1 tablet (81 mg total) by mouth daily. 11/19/17   Barrett, Joline Salt, PA-C  atorvastatin (LIPITOR) 80 MG tablet Take 1 tablet (80 mg total) by mouth daily. 12/09/17   Allayne Butcher, PA-C  HYDROcodone-acetaminophen (NORCO/VICODIN) 5-325 MG tablet Take 1 tablet by mouth every 4 (four) hours as needed for moderate pain. Patient not taking: Reported on 01/23/2018 12/19/17   Azalia Bilis, MD  loperamide (IMODIUM) 2 MG capsule Take 2 mg by mouth as needed for diarrhea or loose stools.    [provider]  Menthol-Camphor (ICY HOT ADVANCED RELIEF) 16-11 % CREA  Apply 1 application topically as needed (knee pain).    [provider]  metoprolol succinate (TOPROL-XL) 25 MG 24 hr tablet Take 1 tablet (25 mg total) by mouth daily. 12/09/17   Robbie Lis M, PA-C  nitroGLYCERIN (NITROSTAT) 0.4 MG SL tablet Place 1 tablet (0.4 mg total) under the tongue every 5 (five) minutes x 3 doses as needed for chest pain. 12/09/17   Robbie Lis M, PA-C  prednisoLONE acetate (PRED FORTE) 1 % ophthalmic suspension Place 1 drop into the right eye at bedtime. 11/29/17   Medina-Vargas, Monina C, NP  sertraline (ZOLOFT) 50 MG tablet Take 1 tablet (50 mg total) by mouth at bedtime. 11/29/17   Medina-Vargas, Monina C, NP  ticagrelor (BRILINTA) 90 MG TABS tablet Take 1 tablet (90 mg total) by mouth 2 (two) times daily. 12/09/17   Robbie Lis M, PA-C  timolol (TIMOPTIC) 0.5 % ophthalmic solution Place 1 drop into the right eye daily. 11/29/17   Medina-Vargas, Monina C, NP  TRAVATAN Z 0.004 % SOLN ophthalmic solution Place 1 drop into the left eye at bedtime. 11/29/17   Kenard Gower C, NP    Physical Exam: Vitals:   02/04/18 1715 02/04/18 1730 02/04/18 1745 02/04/18 1815  BP: 125/85 118/86 118/88 128/89  Pulse: 80 82 83 79  Resp: 20 14 13  13   Temp:      SpO2: 96% 98% 96% 99%  Weight:      Height:        Constitutional: deconditioned  Vitals:   02/04/18 1715 02/04/18 1730 02/04/18 1745 02/04/18 1815  BP: 125/85 118/86 118/88 128/89  Pulse: 80 82 83 79  Resp: 20 14 13 13   Temp:      SpO2: 96% 98% 96% 99%  Weight:      Height:       Eyes: PERRL, lids and conjunctivae is pale.  Head normocephalic, nose and ears with no deformities ENMT: Mucous membranes are dry. Posterior pharynx clear of any exudate or lesions.Normal dentition.  Neck: normal, supple, no masses, no thyromegaly Respiratory: clear to auscultation bilaterally, no wheezing, no crackles. Poor inspiratory effort. Normal respiratory effort. No accessory muscle use.  Cardiovascular: Regular rate and rhythm, no murmurs / rubs / gallops. Trace extremity edema. 2+ pedal pulses. No carotid bruits.  Abdomen: protuberant with no tenderness, no masses palpated. No hepatosplenomegaly. Bowel sounds positive.  Musculoskeletal: no clubbing / cyanosis. No joint deformity upper and lower extremities. Good ROM, no contractures. Normal muscle tone. Pain to palpation at the right hip.   Skin: no rashes, lesions, ulcers. No induration Neurologic: CN 2-12 grossly intact. Sensation intact, DTR normal. Strength 5/5 in all 4.     Labs on Admission: I have personally reviewed following labs and imaging studies  CBC: Recent Labs  Lab 02/04/18 1355  WBC 5.4  NEUTROABS 3.3  HGB 11.1*  HCT 35.7*  MCV 89.9  PLT 185   Basic Metabolic Panel: Recent Labs  Lab 02/04/18 1355  NA 139  K 3.8  CL 108  CO2 23  GLUCOSE 105*  BUN 14  CREATININE 1.31*  CALCIUM 9.0   GFR: Estimated Creatinine Clearance: 48.7 mL/min (A) (by C-G formula based on SCr of 1.31 mg/dL (H)). Liver Function Tests: No results for input(s): AST, ALT, ALKPHOS, BILITOT, PROT, ALBUMIN in the last 168 hours. No results for input(s): LIPASE, AMYLASE in the last 168 hours. No results for input(s): AMMONIA  in the last 168 hours. Coagulation Profile: Recent Labs  Lab 02/04/18 1531  INR 1.09   Cardiac Enzymes: No results for input(s): CKTOTAL, CKMB, CKMBINDEX, TROPONINI in the last 168 hours. BNP (last 3 results) No results for input(s): PROBNP in the last 8760 hours. HbA1C: No results for input(s): HGBA1C in the last 72 hours. CBG: No results for input(s): GLUCAP in the last 168 hours. Lipid Profile: No results for input(s): CHOL, HDL, LDLCALC, TRIG, CHOLHDL, LDLDIRECT in the last 72 hours. Thyroid Function Tests: No results for input(s): TSH, T4TOTAL, FREET4, T3FREE, THYROIDAB in the last 72 hours. Anemia Panel: No results for input(s): VITAMINB12, FOLATE, FERRITIN, TIBC, IRON, RETICCTPCT in the last 72 hours. Urine analysis:    Component Value Date/Time   COLORURINE YELLOW 02/04/2018 1540   APPEARANCEUR CLOUDY (A) 02/04/2018 1540   LABSPEC 1.015 02/04/2018 1540   PHURINE 5.0 02/04/2018 1540   GLUCOSEU NEGATIVE 02/04/2018 1540   HGBUR SMALL (A) 02/04/2018 1540   BILIRUBINUR NEGATIVE 02/04/2018 1540   KETONESUR NEGATIVE 02/04/2018 1540   PROTEINUR NEGATIVE 02/04/2018 1540   UROBILINOGEN 0.2 01/22/2015 0340   NITRITE POSITIVE (A) 02/04/2018 1540   LEUKOCYTESUR LARGE (A) 02/04/2018 1540    Radiological Exams on Admission: Dg Chest 2 View  Result Date: 02/04/2018 CLINICAL DATA:  Fall.  Altered mental status. EXAM: CHEST - 2 VIEW COMPARISON:  Chest x-ray dated January 23, 2018. FINDINGS: The heart size and mediastinal contours are stable. Normal pulmonary vascularity. Stable elevation of the right hemidiaphragm and bibasilar atelectasis/scarring. No focal consolidation, pleural effusion, or pneumothorax. No acute osseous abnormality. IMPRESSION: Unchanged bibasilar atelectasis/scarring. No active cardiopulmonary disease. Electronically Signed   By: Obie Dredge M.D.   On: 02/04/2018 16:26   Dg Lumbar Spine Complete  Result Date: 02/04/2018 CLINICAL DATA:  Larey Seat, right hip pain  with some shortening of the right leg, hallucinations EXAM: LUMBAR SPINE - COMPLETE 4+ VIEW COMPARISON:  Lumbar spine films of 12/04/2006 FINDINGS: The lumbar vertebrae are somewhat straightened in alignment. There is degenerative change and probable fusion at L3-4 with degenerative disc disease at L4-5. No compression deformity is seen. Neurostimulator battery pack overlies the right abdomen with the electrodes extending into the lower thoracic spine region IMPRESSION: 1. No acute fracture.  No compression deformity. 2. Degenerative change in apparent fusion at L3-4. 3. Degenerative disc disease at L4-5. Electronically Signed   By: Dwyane Dee M.D.   On: 02/04/2018 16:25   Ct Head Wo Contrast  Result Date: 02/04/2018 CLINICAL DATA:  Intermittent hallucinations for the last few days. Recent fall. Patient is anticoagulated. EXAM: CT HEAD WITHOUT CONTRAST CT CERVICAL SPINE WITHOUT CONTRAST TECHNIQUE: Multidetector CT imaging of the head and cervical spine was performed following the standard protocol without intravenous contrast. Multiplanar CT image reconstructions of the cervical spine were also generated. COMPARISON:  December 16, 2017 FINDINGS: CT HEAD FINDINGS Brain: No subdural, epidural, or subarachnoid hemorrhage. No mass effect or midline shift. Ventricles and sulci are prominent but stable. Cerebellum, brainstem, and basal cisterns are normal. Scattered white matter changes. No acute cortical ischemia or infarct. A lacunar infarct is seen in the left basal ganglia on series 4, image 17, unchanged. Vascular: Calcified atherosclerosis in the intracranial carotids. Skull: Normal. Negative for fracture or focal lesion. Sinuses/Orbits: No acute finding. Other: None. CT CERVICAL SPINE FINDINGS Alignment: No traumatic malalignment. Skull base and vertebrae: No acute fracture. No primary bone lesion or focal pathologic process. Soft tissues and spinal canal: No prevertebral fluid or swelling. No visible canal  hematoma. Disc levels:  Multilevel degenerative changes. Upper chest: Negative. Other: No  other abnormalities. IMPRESSION: 1. No acute intracranial abnormalities identified. Chronic white matter changes. 2. No traumatic malalignment or fracture in the cervical spine. Multilevel degenerative changes. Electronically Signed   By: Gerome Sam III M.D   On: 02/04/2018 16:53   Ct Cervical Spine Wo Contrast  Result Date: 02/04/2018 CLINICAL DATA:  Intermittent hallucinations for the last few days. Recent fall. Patient is anticoagulated. EXAM: CT HEAD WITHOUT CONTRAST CT CERVICAL SPINE WITHOUT CONTRAST TECHNIQUE: Multidetector CT imaging of the head and cervical spine was performed following the standard protocol without intravenous contrast. Multiplanar CT image reconstructions of the cervical spine were also generated. COMPARISON:  December 16, 2017 FINDINGS: CT HEAD FINDINGS Brain: No subdural, epidural, or subarachnoid hemorrhage. No mass effect or midline shift. Ventricles and sulci are prominent but stable. Cerebellum, brainstem, and basal cisterns are normal. Scattered white matter changes. No acute cortical ischemia or infarct. A lacunar infarct is seen in the left basal ganglia on series 4, image 17, unchanged. Vascular: Calcified atherosclerosis in the intracranial carotids. Skull: Normal. Negative for fracture or focal lesion. Sinuses/Orbits: No acute finding. Other: None. CT CERVICAL SPINE FINDINGS Alignment: No traumatic malalignment. Skull base and vertebrae: No acute fracture. No primary bone lesion or focal pathologic process. Soft tissues and spinal canal: No prevertebral fluid or swelling. No visible canal hematoma. Disc levels:  Multilevel degenerative changes. Upper chest: Negative. Other: No other abnormalities. IMPRESSION: 1. No acute intracranial abnormalities identified. Chronic white matter changes. 2. No traumatic malalignment or fracture in the cervical spine. Multilevel degenerative changes.  Electronically Signed   By: Gerome Sam III M.D   On: 02/04/2018 16:53   Dg Hip Unilat W Or Wo Pelvis 2-3 Views Right  Result Date: 02/04/2018 CLINICAL DATA:  Right hip pain EXAM: DG HIP (WITH OR WITHOUT PELVIS) 2-3V RIGHT COMPARISON:  None. FINDINGS: There is no hip fracture or dislocation. There is a nondisplaced fracture of the right superior pubic ramus. There is generalized osteopenia. There is no evidence of arthropathy or other focal bone abnormality. There is peripheral vascular atherosclerotic disease. IMPRESSION: Nondisplaced fracture of the right superior pubic ramus. Given the patient's age and osteopenia, if there is persistent clinical concern for an occult hip fracture, a MRI of the hip is recommended for increased sensitivity. Electronically Signed   By: Elige Ko   On: 02/04/2018 16:24    EKG: Independently reviewed.  EKG with sinus rhythm, left axis deviation, right bundle branch block.  Assessment/Plan Active Problems:   Encephalopathy  84 year old male who presented with right hip pain and ambulatory dysfunction after a mechanical fall.  He was noted to be confused at home.  On the initial physical examination he is awake and alert, oriented x3, his blood pressure is 118/88, heart rate is 83, respiratory rate 13, oxygen saturation 96%.  He has mild pallor, dry mucous membranes, lungs clear to auscultation, heart S1-S2 present rhythmic, abdomen protuberant, nontender, trace lower extremity edema, pain at right hip on palpation.  Sodium 139, potassium 3.8, chloride 108, bicarb 23, glucose 105, BUN 14, creatinine 1.31, white count 5.4, hemoglobin 11.1, hematocrit 35.7, platelets 185.  Urinalysis with greater than 50 white cells, head and cervical spine CT with no acute changes.  Hip films with nondisplaced fracture of the right superior pubic ramus.  Chest x-ray with left rotation, hypoinflation, right hemidiaphragm elevation.  No infiltrates.  Patient will be admitted to the  hospital with the working diagnosis of metabolic encephalopathy due to urinary tract infection, complicated by right  superior pelvic ramus fracture.  1. Metabolic encephalopathy.  Will admit patient to the medical ward, he will be placed on a remote telemetry monitor, neuro checks every 4 hours, intravenous fluids with isotonic saline, patient will need further pain control and physical therapy evaluation.  2.  Urinary tract infection, present on admission.  Will continue antibiotic therapy with IV ceftriaxone, follow-up on cell count, cultures and temperature curve.  Patient will get renal ultrasonography to rule out obstructive uropathy.  3.  Hypertension.  Will continue blood pressure control with metoprolol 25 succinate mg daily.  4.  Right superior pubic ramus fracture.  DVT prophylaxis and physical therapy evaluation.  5.  Coronary artery disease.  Chest pain-free, continue atorvastatin and ticagrelor.  6.  Type 2 diabetes mellitus.  Currently patient is diet-controlled, his random glucose is 105, patient will placed on insulin sliding scale for glucose coverage and monitoring.  7.  Chronic kidney disease stage III.  Serum creatinine 1.3 at it's baseline, continue to follow-up kidney function in the morning, avoid hypotension and nephrotoxic agents.  DVT prophylaxis: enoxaparin  Code Status:  full  Family Communication: no family at the bedside   Disposition Plan: pending physical therapy evaluation.   Consults called: none  Admission status: Inpatient    Deania Siguenza Annett Gula MD [facility] Pager [phone]  If 7PM-7AM, please contact night-coverage [email or web address] Password [facility]  02/04/2018, 6:21 PM

## 2018-02-04 NOTE — ED Triage Notes (Signed)
Pt from home via ems; pt reports hallucinations yesterday, seeing spiders, throwing money in the air; history of same w/ UTI; fell yesterday, reports increased weakness; no loc; PMS intact; PERRLA; A&Ox4; pain and difficulty moving right leg, no obvious deformity  HR 84 RR 16 134/90 99% RA

## 2018-02-04 NOTE — ED Provider Notes (Signed)
Patient presenting with right hip pain after fall, reportedly yesterday.  Patient has not been able to bear weight on his right leg since.  Patient also reports that he has had been having intermittent hallucinations for the past few days and he states they can have been coming and going for the past few weeks.  He denies hitting his head or losing consciousness when he fell.  He is anticoagulated.  He denies any numbness or tingling.  He also denies any chest pain, shortness of breath, abdominal pain, nausea, vomiting, urinary symptoms.  Of note, patient states he has had hallucinations in the past when he has had a UTI.  He denies any SI or HI.  No recent medication changes.  MSE was initiated and I personally evaluated the patient and placed orders (if any) at  3:03 PM on February 04, 2018.  The patient appears stable so that the remainder of the MSE may be completed by another provider.   Emi Holes, PA-C 02/04/18 1510    Raeford Razor, MD 02/05/18 [phone]

## 2018-02-04 NOTE — ED Notes (Signed)
Patient transported to CT 

## 2018-02-04 NOTE — ED Provider Notes (Signed)
[facility] EMERGENCY DEPARTMENT Provider Note   CSN: [id number] Arrival date & time: 02/04/18  1345     History   Chief Complaint Chief Complaint  Patient presents with  . Fall  . Altered Mental Status    HPI [name] is a 84 y.o. male.  Patient was getting up from a chair and it rolled out from under him. He has had right sided hip pain. The patient had a similar fall 1 month ago. He has had 3 falls in the last six months.   The history is provided by the patient.  Fall  This is a new problem. The current episode started yesterday. Episode frequency: once. The problem has not changed since onset.Pertinent negatives include no chest pain, no abdominal pain, no headaches and no shortness of breath. The symptoms are aggravated by walking. The symptoms are relieved by lying down. He has tried acetaminophen for the symptoms. The treatment provided mild relief.    Past Medical History:  Diagnosis Date  . Arthritis    "all over" (11/12/2017)  . CAD (coronary artery disease) 11/11/2017   S/p IL STEMI 6/19:  S/p DES to pLCx and DES x 2 to OM2 // staged PCI 6/19:  DES x 2 to RCA // residual CAD at cath 6/19:  mLAD 80, dLAD 90, D1 95 tx medically // EF 40-45 by echo  . Chronic combined systolic and diastolic CHF (congestive heart failure) (HCC)    ischemic CM // s/p MI in 11/2017 >> Echo 6/19:  Moderate concentric LVH, EF 40-45, grade 1 diastolic dysfunction, inferior, inferolateral, apical inferior akinesis   . Chronic lower back pain   . CKD (chronic kidney disease), stage III (HCC)   . Constipation    takes an OTC med as needed  . Diabetic nephropathy (HCC)   . Gastric ulcer 1970s  . Glaucoma, both eyes    uses eye drops daily  . Gout    takes Allopurinol daily  . History of colon polyps   . History of ST elevation myocardial infarction (STEMI) 11/10/2017   PCI to LCx and staged PCI to RCA  . History of stress test    a. 09/2014 MV: EF 53%, no  ischemia/infarct.  . Hyperlipidemia    takes Pravastatin daily  . Hypertension    takes Amlodipine and Losartan daily  . Insomnia    uses OTC sleep aide  . Joint pain   . Joint swelling    right knee   . Legally blind   . Macular degeneration   . Morbid obesity (HCC)   . Nocturia   . Peripheral edema   . Pleurisy 20+yrs ago  . Pneumonia    "I think once when I was a kid" (11/12/2017)  . Sleep apnea   . Stroke [facility])    "I've had them; don't know anything about them"; denies residual on 11/12/2017)  . Syncope    a. 09/2014 in setting of orthostasis; b. 10/2014 Event monitor: 19 beat run of asymp NSVT; c. 01/2015 Echo: EF 60-65%, no rwma.  . Type II diabetes mellitus (HCC)    takes Metformin daily  . Urinary frequency    takes Flomax daily    Patient Active Problem List   Diagnosis Date Noted  . Recurrent syncope 01/23/2018  . Diarrhea   . Chest pain 12/08/2017  . Ischemic cardiomyopathy 11/28/2017  . Neurocognitive deficits 11/19/2017  . Bilious vomiting   . Ileus, unspecified (HCC) 11/13/2017  .  Sleep apnea 11/13/2017  . Obesity 11/13/2017  . Stage III chronic kidney disease (HCC) 11/11/2017  . Acute on chronic combined systolic and diastolic CHF (congestive heart failure) (HCC) 11/11/2017  . CAD (coronary artery disease) 11/11/2017  . Acute ST elevation myocardial infarction (STEMI) of inferolateral wall (HCC) 11/10/2017  . ST elevation myocardial infarction (STEMI) (HCC)   . AKI (acute kidney injury) (HCC) 01/22/2015  . Lactic acidosis 01/22/2015  . Syncope and collapse   . HTN (hypertension), benign   . Encephalopathy 09/07/2014  . Osteoarthrosis, unspecified whether generalized or localized, shoulder region 09/21/2013  . Gout 12/23/2012  . Hypotension 12/21/2012  . Type 2 diabetes mellitus with complication, without long-term current use of insulin (HCC) 12/21/2012  . Hyperlipidemia   . Morbid obesity (HCC)     Past Surgical History:  Procedure Laterality Date   . BACK SURGERY    . CATARACT EXTRACTION W/ INTRAOCULAR LENS  IMPLANT, BILATERAL Bilateral   . COLONOSCOPY    . CORONARY ANGIOGRAPHY N/A 11/12/2017   Procedure: CORONARY ANGIOGRAPHY;  Surgeon: Lennette Bihari, MD;  Location: [facility] INVASIVE CV LAB;  Service: Cardiovascular;  Laterality: N/A;  . CORONARY STENT INTERVENTION N/A 11/12/2017   Procedure: CORONARY STENT INTERVENTION;  Surgeon: Lennette Bihari, MD;  Location: [facility] INVASIVE CV LAB;  Service: Cardiovascular;  Laterality: N/A;  . CORONARY/GRAFT ACUTE MI REVASCULARIZATION N/A 11/10/2017   Procedure: Coronary/Graft Acute MI Revascularization;  Surgeon: Lennette Bihari, MD;  Location: [facility] INVASIVE CV LAB;  Service: Cardiovascular;  Laterality: N/A;  . EYE SURGERY    . GLAUCOMA SURGERY Bilateral   . INSERTION / PLACEMENT / REVISION NEUROSTIMULATOR  2000s  . KNEE ARTHROSCOPY Right 1990s  . LEFT HEART CATH AND CORONARY ANGIOGRAPHY N/A 11/10/2017   Procedure: LEFT HEART CATH AND CORONARY ANGIOGRAPHY;  Surgeon: Lennette Bihari, MD;  Location: [facility] INVASIVE CV LAB;  Service: Cardiovascular;  Laterality: N/A;  . MINI SHUNT INSERTION  09/26/2011   Procedure: INSERTION OF MINI SHUNT;  Surgeon: Chalmers Guest, MD;  Location: [facility] OR;  Service: Ophthalmology;  Laterality: Right;  Insertion of Ahmed shunt  . POSTERIOR FUSION LUMBAR SPINE  1990s  . REVERSE SHOULDER ARTHROPLASTY Right 09/21/2013   Procedure: RIGHT  SHOULDER ARTHROPLASTY REVERSE ;  Surgeon: [name], MD;  Location: [facility] OR;  Service: Orthopedics;  Laterality: Right;  . SHOULDER INJECTION Left 09/21/2013   Procedure: SHOULDER INJECTION;  Surgeon: [name], MD;  Location: [facility] OR;  Service: Orthopedics;  Laterality: Left;        Home Medications    Prior to Admission medications   Medication Sig Start Date End Date Taking? Authorizing Provider  acetaminophen (TYLENOL) 500 MG tablet Take 1,000 mg by mouth as needed for mild pain.    [provider]  aspirin EC 81 MG EC tablet Take 1  tablet (81 mg total) by mouth daily. 11/19/17   Barrett, Joline Salt, PA-C  atorvastatin (LIPITOR) 80 MG tablet Take 1 tablet (80 mg total) by mouth daily. 12/09/17   Allayne Butcher, PA-C  HYDROcodone-acetaminophen (NORCO/VICODIN) 5-325 MG tablet Take 1 tablet by mouth every 4 (four) hours as needed for moderate pain. Patient not taking: Reported on 01/23/2018 12/19/17   Azalia Bilis, MD  loperamide (IMODIUM) 2 MG capsule Take 2 mg by mouth as needed for diarrhea or loose stools.    [provider]  Menthol-Camphor (ICY HOT ADVANCED RELIEF) 16-11 % CREA Apply 1 application topically as needed (knee pain).    [provider]  metoprolol succinate (TOPROL-XL) 25 MG 24 hr tablet Take 1 tablet (25 mg total) by mouth daily. 12/09/17   Robbie Lis M, PA-C  nitroGLYCERIN (NITROSTAT) 0.4 MG SL tablet Place 1 tablet (0.4 mg total) under the tongue every 5 (five) minutes x 3 doses as needed for chest pain. 12/09/17   Robbie Lis M, PA-C  prednisoLONE acetate (PRED FORTE) 1 % ophthalmic suspension Place 1 drop into the right eye at bedtime. 11/29/17   Medina-Vargas, Monina C, NP  sertraline (ZOLOFT) 50 MG tablet Take 1 tablet (50 mg total) by mouth at bedtime. 11/29/17   Medina-Vargas, Monina C, NP  ticagrelor (BRILINTA) 90 MG TABS tablet Take 1 tablet (90 mg total) by mouth 2 (two) times daily. 12/09/17   Robbie Lis M, PA-C  timolol (TIMOPTIC) 0.5 % ophthalmic solution Place 1 drop into the right eye daily. 11/29/17   Medina-Vargas, Monina C, NP  TRAVATAN Z 0.004 % SOLN ophthalmic solution Place 1 drop into the left eye at bedtime. 11/29/17   Medina-Vargas, Margit Banda, NP    Family History Family History  Problem Relation Age of Onset  . Cancer Mother   . Alzheimer's disease Maternal Aunt   . Anesthesia problems Neg Hx     Social History Social History   Tobacco Use  . Smoking status: Former Smoker    Packs/day: 2.00    Types: Cigarettes  . Smokeless tobacco: Never Used   . Tobacco comment: 11/12/2017 "nothing since the 1980s"  Substance Use Topics  . Alcohol use: Not Currently    Comment: 11/12/2017 "nothing since the 1980s"  . Drug use: Never     Allergies   Patient has no known allergies.   Review of Systems Review of Systems  Constitutional: Negative for chills and fever.  HENT: Negative for congestion and rhinorrhea.   Respiratory: Negative for shortness of breath.   Cardiovascular: Negative for chest pain.  Gastrointestinal: Negative for abdominal pain, diarrhea, nausea and vomiting.  Genitourinary: Positive for difficulty urinating (hard to start), dysuria (3 days) and urgency. Negative for frequency.  Musculoskeletal: Positive for back pain (chronic low back pain) and gait problem (pain with walking). Negative for neck pain.  Skin: Negative for wound.  Neurological: Negative for dizziness and headaches.     Physical Exam Updated Vital Signs BP 132/85   Pulse 78   Temp 98.4 F (36.9 C)   Resp 14   Ht 5\' 10"  (1.778 m)   Wt 95.7 kg   SpO2 100%   BMI 30.28 kg/m   Physical Exam  Constitutional: He appears well-developed and well-nourished.  HENT:  Head: Normocephalic and atraumatic.  Eyes: Conjunctivae are normal.  Neck: Neck supple.  Cardiovascular: Normal rate and regular rhythm.  No murmur heard. Pulmonary/Chest: Effort normal and breath sounds normal. No respiratory distress.  Abdominal: Soft. There is no tenderness.  Musculoskeletal: He exhibits no edema.  Neurological: He is alert.  Skin: Skin is warm and dry.  Psychiatric: He has a normal mood and affect.  Nursing note and vitals reviewed.    ED Treatments / Results  Labs (all labs ordered are listed, but only abnormal results are displayed) Labs Reviewed  URINALYSIS, ROUTINE W REFLEX MICROSCOPIC  CBC WITH DIFFERENTIAL/PLATELET  BASIC METABOLIC PANEL  I-STAT TROPONIN, ED    EKG None  Radiology No results found.  Procedures Procedures (including  critical care time)  Medications Ordered in ED Medications - No data to display   Initial Impression / Assessment and Plan /  ED Course  I have reviewed the triage vital signs and the nursing notes.  Pertinent labs & imaging results that were available during my care of the patient were reviewed by me and considered in my medical decision making (see chart for details).     The patient is an 84 year old man with past medical history of CKD, HTN, CAD who presents with fall and altered mental status.  The patient was found altered by EMS and was hallucinating spiders.  On my exam he is alert and oriented x4.  Suspect delirium.  Patient endorses dysuria and frequency.  The patient's primary complaint is right hip pain after fall.  The patient reports that he was getting up out of a chair with wheels, the chair had rolled out from under him.  He landed on his bottom.  He denies trauma to his head or LOC.  He is on Brilinta.  CT head shows no acute intracranial abnormalities.  X-ray right hip shows superior pubic ramus fracture. UA shows cystitis. Patient placed on antibiotics. CBC shows anemia but no leukocytosis. BMP shows elevated creatinine (similar to prior). PT/INR. Troponin negative.  Patient admitted to internal medicine for cystitis with delirium and fall causing hip fracture.  Patient care supervised by Dr. Jeraldine Loots.  Nash Dimmer, MD   Final Clinical Impressions(s) / ED Diagnoses   Final diagnoses:  None    ED Discharge Orders    None       Nash Dimmer, MD 02/05/18 1418    Gerhard Munch, MD 02/06/18 [id number]

## 2018-02-05 ENCOUNTER — Inpatient Hospital Stay (HOSPITAL_COMMUNITY): Payer: Medicare Other

## 2018-02-05 ENCOUNTER — Encounter (HOSPITAL_COMMUNITY): Payer: Self-pay

## 2018-02-05 ENCOUNTER — Other Ambulatory Visit: Payer: Self-pay

## 2018-02-05 DIAGNOSIS — S32511A Fracture of superior rim of right pubis, initial encounter for closed fracture: Secondary | ICD-10-CM | POA: Diagnosis not present

## 2018-02-05 DIAGNOSIS — G9340 Encephalopathy, unspecified: Secondary | ICD-10-CM | POA: Diagnosis not present

## 2018-02-05 DIAGNOSIS — N390 Urinary tract infection, site not specified: Secondary | ICD-10-CM | POA: Diagnosis not present

## 2018-02-05 DIAGNOSIS — G9341 Metabolic encephalopathy: Secondary | ICD-10-CM | POA: Diagnosis not present

## 2018-02-05 LAB — BASIC METABOLIC PANEL
Anion gap: 7 (ref 5–15)
BUN: 12 mg/dL (ref 8–23)
CO2: 26 mmol/L (ref 22–32)
Calcium: 8.7 mg/dL — ABNORMAL LOW (ref 8.9–10.3)
Chloride: 108 mmol/L (ref 98–111)
Creatinine, Ser: 1.18 mg/dL (ref 0.61–1.24)
GFR calc Af Amer: >60 mL/min (ref >60)
GFR calc non Af Amer: 55 mL/min — ABNORMAL LOW (ref >60)
Glucose, Bld: 89 mg/dL (ref 70–99)
Potassium: 4 mmol/L (ref 3.5–5.1)
Sodium: 141 mmol/L (ref 135–145)

## 2018-02-05 LAB — GLUCOSE, CAPILLARY
Glucose-Capillary: 93 mg/dL (ref 70–99)
Glucose-Capillary: 159 mg/dL — ABNORMAL HIGH (ref 70–99)
Glucose-Capillary: 115 mg/dL — ABNORMAL HIGH (ref 70–99)
Glucose-Capillary: 114 mg/dL — ABNORMAL HIGH (ref 70–99)

## 2018-02-05 LAB — CBC
HCT: 35.1 % — ABNORMAL LOW (ref 39.0–52.0)
Hemoglobin: 11.1 g/dL — ABNORMAL LOW (ref 13.0–17.0)
MCH: 28.2 pg (ref 26.0–34.0)
MCHC: 31.6 g/dL (ref 30.0–36.0)
MCV: 89.1 fL (ref 78.0–100.0)
Platelets: 181 10*3/uL (ref 150–400)
RBC: 3.94 MIL/uL — ABNORMAL LOW (ref 4.22–5.81)
RDW: 15.1 % (ref 11.5–15.5)
WBC: 5.2 10*3/uL (ref 4.0–10.5)

## 2018-02-05 MED ORDER — HYDRALAZINE HCL 20 MG/ML IJ SOLN
5.0000 mg | Freq: Once | INTRAMUSCULAR | Status: AC
  Administered 2018-02-05: 5 mg via INTRAVENOUS
  Filled 2018-02-05: qty 1

## 2018-02-05 NOTE — Progress Notes (Signed)
PT Cancellation Note  Patient Details Name: [name] MRN: [id number] DOB: 12-21-1930   Cancelled Treatment:    Reason Eval/Treat Not Completed: Patient not medically ready; will await further hip imaging and weight bearing orders prior to initiating PT.    [name] 02/05/2018, 9:09 AM  Sheran Lawless, PT [phone] 02/05/2018

## 2018-02-05 NOTE — Progress Notes (Signed)
[facility]  PROGRESS NOTE  [name] ZOX:[id number] DOB: 16-May-1931 DOA: 02/04/2018 PCP: Clayborn Heron, MD   Brief HPI:    84 year old male with a history of CAD status post PCI in July 2019, chronic systolic heart failure, blindness, type 2 diabetes mellitus, dyslipidemia, obesity was brought to hospital after patient fell from couch.  Patient experienced significant right hip pain and was brought to the ED for further evaluation.  In the ED patient was found to be confused and was found to have abnormal UA started on IV ceftriaxone for UTI.  X-ray of the pelvis with right hip showed superior pubic ramus fracture   Subjective   Patient continues to have right hip pain   Assessment/Plan:     1. Encephalopathy-resolved, likely from underlying UTI.  Patient is on IV antibiotics.  2. UTI-patient had urine culture done on 01/23/2018 which showed Klebsiella pneumoniae, Sensitive to ceftriaxone.  Patient had abnormal UA at the time of admission with positive nitrite.  Urine culture was not obtained, will obtain urine cultures, continue with IV ceftriaxone empirically.  Follow urine culture results.  3. Right hip pain-discussed with radiologist, x-ray of the hip showed superior pubic ramus fracture, patient does have significant osteopenia and recommended MRI of the right hip to rule out occult hip fracture.  Will order MRI of the right hip.  4. Right superior pubic ramus fracture-we will get PT evaluation after MRI of right hip.  5. CAD-stable, continue atorvastatin, ticagrelor  6. Diabetes mellitus type 2-diet controlled, continue sliding scale insulin with NovoLog.  7. Chronic kidney disease stage III-20 serum creatinine 1.18, at baseline.  8. Hypertension-blood pressure stable, continue metoprolol 25 mg daily.     CBG: Recent Labs  Lab 02/04/18 2111 02/05/18 0749 02/05/18 1211  GLUCAP 154* 93 159*    CBC: Recent Labs  Lab 02/04/18 1355 02/05/18 0403   WBC 5.4 5.2  NEUTROABS 3.3  --   HGB 11.1* 11.1*  HCT 35.7* 35.1*  MCV 89.9 89.1  PLT 185 181    Basic Metabolic Panel: Recent Labs  Lab 02/04/18 1355 02/05/18 0403  NA 139 141  K 3.8 4.0  CL 108 108  CO2 23 26  GLUCOSE 105* 89  BUN 14 12  CREATININE 1.31* 1.18  CALCIUM 9.0 8.7*     DVT prophylaxis: Lovenox  Code Status: Full code  Family Communication: No family at bedside  Disposition Plan: likely home when medically ready for discharge   Consultants:    Procedures:     Antibiotics:   Anti-infectives (From admission, onward)   Start     Dose/Rate Route Frequency Ordered Stop   02/04/18 2130  cefTRIAXone (ROCEPHIN) 1 g in sodium chloride 0.9 % 100 mL IVPB     1 g 200 mL/hr over 30 Minutes Intravenous Every 24 hours 02/04/18 2120     02/04/18 1800  cephALEXin (KEFLEX) capsule 500 mg     500 mg Oral  Once 02/04/18 1746 02/04/18 1815       Objective   Vitals:   02/04/18 2359 02/05/18 0546 02/05/18 0547 02/05/18 1241  BP: (!) 164/98 (!) 143/126 (!) 171/90 (!) 145/78  Pulse: 72 74 74 73  Resp:  18    Temp:  97.6 F (36.4 C)  98.9 F (37.2 C)  TempSrc:  Oral  Oral  SpO2:  97% 99% 98%  Weight:      Height:        Intake/Output Summary (Last 24 hours) at  02/05/2018 1659 Last data filed at 02/05/2018 0615 Gross per 24 hour  Intake 656.6 ml  Output 725 ml  Net -68.4 ml   Filed Weights   02/04/18 1357  Weight: 95.7 kg     Physical Examination:    General: Appears in no acute distress  Cardiovascular: S1-S2, regular, no murmurs rubs or gallops  Respiratory: Clear to auscultation bilaterally  Abdomen: Soft, nontender, no organomegaly  Extremities: Tenderness at right hip joint  Neurologic: Alert, oriented x3, no focal deficit noted at this time     Data Reviewed: I have personally reviewed following labs and imaging studies   No results found for this or any previous visit (from the past 240 hour(s)).   Liver Function  Tests: No results for input(s): AST, ALT, ALKPHOS, BILITOT, PROT, ALBUMIN in the last 168 hours. No results for input(s): LIPASE, AMYLASE in the last 168 hours. No results for input(s): AMMONIA in the last 168 hours.  Cardiac Enzymes: No results for input(s): CKTOTAL, CKMB, CKMBINDEX, TROPONINI in the last 168 hours. BNP (last 3 results) Recent Labs    12/08/17 1122 01/23/18 1010  BNP 160.8* 67.8    ProBNP (last 3 results) No results for input(s): PROBNP in the last 8760 hours.    Studies: Dg Chest 2 View  Result Date: 02/04/2018 CLINICAL DATA:  Fall.  Altered mental status. EXAM: CHEST - 2 VIEW COMPARISON:  Chest x-ray dated January 23, 2018. FINDINGS: The heart size and mediastinal contours are stable. Normal pulmonary vascularity. Stable elevation of the right hemidiaphragm and bibasilar atelectasis/scarring. No focal consolidation, pleural effusion, or pneumothorax. No acute osseous abnormality. IMPRESSION: Unchanged bibasilar atelectasis/scarring. No active cardiopulmonary disease. Electronically Signed   By: Obie Dredge M.D.   On: 02/04/2018 16:26   Dg Lumbar Spine Complete  Result Date: 02/04/2018 CLINICAL DATA:  Larey Seat, right hip pain with some shortening of the right leg, hallucinations EXAM: LUMBAR SPINE - COMPLETE 4+ VIEW COMPARISON:  Lumbar spine films of 12/04/2006 FINDINGS: The lumbar vertebrae are somewhat straightened in alignment. There is degenerative change and probable fusion at L3-4 with degenerative disc disease at L4-5. No compression deformity is seen. Neurostimulator battery pack overlies the right abdomen with the electrodes extending into the lower thoracic spine region IMPRESSION: 1. No acute fracture.  No compression deformity. 2. Degenerative change in apparent fusion at L3-4. 3. Degenerative disc disease at L4-5. Electronically Signed   By: Dwyane Dee M.D.   On: 02/04/2018 16:25   Ct Head Wo Contrast  Result Date: 02/04/2018 CLINICAL DATA:  Intermittent  hallucinations for the last few days. Recent fall. Patient is anticoagulated. EXAM: CT HEAD WITHOUT CONTRAST CT CERVICAL SPINE WITHOUT CONTRAST TECHNIQUE: Multidetector CT imaging of the head and cervical spine was performed following the standard protocol without intravenous contrast. Multiplanar CT image reconstructions of the cervical spine were also generated. COMPARISON:  December 16, 2017 FINDINGS: CT HEAD FINDINGS Brain: No subdural, epidural, or subarachnoid hemorrhage. No mass effect or midline shift. Ventricles and sulci are prominent but stable. Cerebellum, brainstem, and basal cisterns are normal. Scattered white matter changes. No acute cortical ischemia or infarct. A lacunar infarct is seen in the left basal ganglia on series 4, image 17, unchanged. Vascular: Calcified atherosclerosis in the intracranial carotids. Skull: Normal. Negative for fracture or focal lesion. Sinuses/Orbits: No acute finding. Other: None. CT CERVICAL SPINE FINDINGS Alignment: No traumatic malalignment. Skull base and vertebrae: No acute fracture. No primary bone lesion or focal pathologic process. Soft tissues and spinal canal:  No prevertebral fluid or swelling. No visible canal hematoma. Disc levels:  Multilevel degenerative changes. Upper chest: Negative. Other: No other abnormalities. IMPRESSION: 1. No acute intracranial abnormalities identified. Chronic white matter changes. 2. No traumatic malalignment or fracture in the cervical spine. Multilevel degenerative changes. Electronically Signed   By: Gerome Sam III M.D   On: 02/04/2018 16:53   Ct Cervical Spine Wo Contrast  Result Date: 02/04/2018 CLINICAL DATA:  Intermittent hallucinations for the last few days. Recent fall. Patient is anticoagulated. EXAM: CT HEAD WITHOUT CONTRAST CT CERVICAL SPINE WITHOUT CONTRAST TECHNIQUE: Multidetector CT imaging of the head and cervical spine was performed following the standard protocol without intravenous contrast. Multiplanar CT  image reconstructions of the cervical spine were also generated. COMPARISON:  December 16, 2017 FINDINGS: CT HEAD FINDINGS Brain: No subdural, epidural, or subarachnoid hemorrhage. No mass effect or midline shift. Ventricles and sulci are prominent but stable. Cerebellum, brainstem, and basal cisterns are normal. Scattered white matter changes. No acute cortical ischemia or infarct. A lacunar infarct is seen in the left basal ganglia on series 4, image 17, unchanged. Vascular: Calcified atherosclerosis in the intracranial carotids. Skull: Normal. Negative for fracture or focal lesion. Sinuses/Orbits: No acute finding. Other: None. CT CERVICAL SPINE FINDINGS Alignment: No traumatic malalignment. Skull base and vertebrae: No acute fracture. No primary bone lesion or focal pathologic process. Soft tissues and spinal canal: No prevertebral fluid or swelling. No visible canal hematoma. Disc levels:  Multilevel degenerative changes. Upper chest: Negative. Other: No other abnormalities. IMPRESSION: 1. No acute intracranial abnormalities identified. Chronic white matter changes. 2. No traumatic malalignment or fracture in the cervical spine. Multilevel degenerative changes. Electronically Signed   By: Gerome Sam III M.D   On: 02/04/2018 16:53   US Renal  Result Date: 02/05/2018 CLINICAL DATA:  Urinary tract infection for 1 day. EXAM: RENAL / URINARY TRACT ULTRASOUND COMPLETE COMPARISON:  None. FINDINGS: Right Kidney: Length: 10.9 cm. Diffusely increased parenchymal echotexture with mild parenchymal thinning. Small cysts in the midpole measuring 1.6 cm maximal diameter. No hydronephrosis. Left Kidney: Length: 10.5 cm. Diffusely increased parenchymal echotexture with mild parenchymal thinning. Cyst on the midpole measuring about 4.1 cm diameter. Tiny cyst in the upper pole measuring about 9 mm diameter. No hydronephrosis. Bladder: Bladder is incompletely distended. Bladder wall is thickened. This could be due to under  distention or cystitis. No filling defects. IMPRESSION: 1. Bilateral renal parenchymal atrophy and increased echotexture suggesting chronic medical renal disease. 2. Benign-appearing bilateral renal cysts.  No hydronephrosis. 3. Bladder wall thickening could be due to under distention or cystitis. Electronically Signed   By: Burman Nieves M.D.   On: 02/05/2018 03:12   Dg Hip Unilat W Or Wo Pelvis 2-3 Views Right  Result Date: 02/04/2018 CLINICAL DATA:  Right hip pain EXAM: DG HIP (WITH OR WITHOUT PELVIS) 2-3V RIGHT COMPARISON:  None. FINDINGS: There is no hip fracture or dislocation. There is a nondisplaced fracture of the right superior pubic ramus. There is generalized osteopenia. There is no evidence of arthropathy or other focal bone abnormality. There is peripheral vascular atherosclerotic disease. IMPRESSION: Nondisplaced fracture of the right superior pubic ramus. Given the patient's age and osteopenia, if there is persistent clinical concern for an occult hip fracture, a MRI of the hip is recommended for increased sensitivity. Electronically Signed   By: Elige Ko   On: 02/04/2018 16:24    Scheduled Meds: . atorvastatin  80 mg Oral Daily  . enoxaparin (LOVENOX) injection  40  mg Subcutaneous Q24H  . insulin aspart  0-9 Units Subcutaneous TID WC  . latanoprost  1 drop Left Eye QHS  . metoprolol succinate  25 mg Oral Daily  . prednisoLONE acetate  1 drop Right Eye QHS  . sertraline  50 mg Oral QHS  . ticagrelor  90 mg Oral BID  . timolol  1 drop Right Eye Daily      Time spent: 25 min  Meredeth Ide   [facility] Pager [phone]. If 7PM-7AM, please contact night-coverage at [email or web address], Office  [phone]  password [email or web address]  02/05/2018, 4:59 PM  LOS: 1 day

## 2018-02-05 NOTE — Plan of Care (Signed)
POC reviewed with patient, no acute events throughout shift. Renal U/S complete (see results review). Will report off to oncoming RN

## 2018-02-06 ENCOUNTER — Encounter (HOSPITAL_COMMUNITY): Payer: Self-pay | Admitting: *Deleted

## 2018-02-06 DIAGNOSIS — S32591D Other specified fracture of right pubis, subsequent encounter for fracture with routine healing: Secondary | ICD-10-CM

## 2018-02-06 DIAGNOSIS — N390 Urinary tract infection, site not specified: Secondary | ICD-10-CM | POA: Diagnosis not present

## 2018-02-06 DIAGNOSIS — N3000 Acute cystitis without hematuria: Secondary | ICD-10-CM | POA: Diagnosis not present

## 2018-02-06 DIAGNOSIS — G9341 Metabolic encephalopathy: Secondary | ICD-10-CM | POA: Diagnosis not present

## 2018-02-06 LAB — GLUCOSE, CAPILLARY
Glucose-Capillary: 78 mg/dL (ref 70–99)
Glucose-Capillary: 151 mg/dL — ABNORMAL HIGH (ref 70–99)
Glucose-Capillary: 130 mg/dL — ABNORMAL HIGH (ref 70–99)
Glucose-Capillary: 137 mg/dL — ABNORMAL HIGH (ref 70–99)

## 2018-02-06 MED ORDER — OXYCODONE HCL 5 MG PO TABS
5.0000 mg | ORAL_TABLET | Freq: Four times a day (QID) | ORAL | Status: DC | PRN
  Administered 2018-02-06 – 2018-02-08 (×4): 5 mg via ORAL
  Filled 2018-02-06 (×4): qty 1

## 2018-02-06 MED ORDER — INFLUENZA VAC SPLIT HIGH-DOSE 0.5 ML IM SUSY
0.5000 mL | PREFILLED_SYRINGE | INTRAMUSCULAR | Status: AC
  Administered 2018-02-08: 0.5 mL via INTRAMUSCULAR
  Filled 2018-02-06 (×2): qty 0.5

## 2018-02-06 MED ORDER — ACETAMINOPHEN 325 MG PO TABS
650.0000 mg | ORAL_TABLET | Freq: Four times a day (QID) | ORAL | Status: DC
  Administered 2018-02-06 – 2018-02-12 (×25): 650 mg via ORAL
  Filled 2018-02-06 (×24): qty 2

## 2018-02-06 NOTE — Progress Notes (Signed)
Patient did not present to appt time because she is hospitalized.    Marcelino Duster, PA-C 02/07/2018, 8:33 AM [phone] [facility] [address] [place], [place] [address]

## 2018-02-06 NOTE — Progress Notes (Signed)
[facility]  PROGRESS NOTE  [name] UJW:[id number] DOB: 11-18-1930 DOA: 02/04/2018 PCP: Clayborn Heron, MD   Brief HPI:    84 year old male with a history of CAD status post PCI in July 2019, chronic systolic heart failure, blindness, type 2 diabetes mellitus, dyslipidemia, obesity was brought to hospital after patient fell from couch.  Patient experienced significant right hip pain and was brought to the ED for further evaluation.  In the ED patient was found to be confused and was found to have abnormal UA started on IV ceftriaxone for UTI.  X-ray of the pelvis with right hip showed superior pubic ramus fracture   Subjective   Patient seen and examined, continues to have right hip pain   Assessment/Plan:     1. Encephalopathy-resolved, likely from underlying UTI.  Patient is on IV antibiotics.  2. UTI-patient had urine culture done on 01/23/2018 which showed Klebsiella pneumoniae, Sensitive to ceftriaxone.  Patient had abnormal UA at the time of admission with positive nitrite.  Urine culture was not obtained, will obtain urine cultures, continue with IV ceftriaxone empirically.  Follow urine culture results.  3. Right hip pain-discussed with radiologist, x-ray of the hip showed superior pubic ramus fracture, patient does have significant osteopenia and recommended MRI of the right hip to rule out occult hip fracture.  MRI of the right hip has been ordered, and is currently pending.  4. Right superior pubic ramus fracture-we will get PT evaluation after MRI of right hip.  5. CAD-stable, continue atorvastatin, ticagrelor  6. Diabetes mellitus type 2-diet controlled, continue sliding scale insulin with NovoLog.  7. Chronic kidney disease stage III-20 serum creatinine 1.18, at baseline.  8. Hypertension-blood pressure stable, continue metoprolol 25 mg daily.     CBG: Recent Labs  Lab 02/05/18 1211 02/05/18 1642 02/05/18 2245 02/06/18 0817 02/06/18 1112   GLUCAP 159* 115* 114* 78 151*    CBC: Recent Labs  Lab 02/04/18 1355 02/05/18 0403  WBC 5.4 5.2  NEUTROABS 3.3  --   HGB 11.1* 11.1*  HCT 35.7* 35.1*  MCV 89.9 89.1  PLT 185 181    Basic Metabolic Panel: Recent Labs  Lab 02/04/18 1355 02/05/18 0403  NA 139 141  K 3.8 4.0  CL 108 108  CO2 23 26  GLUCOSE 105* 89  BUN 14 12  CREATININE 1.31* 1.18  CALCIUM 9.0 8.7*     DVT prophylaxis: Lovenox  Code Status: Full code  Family Communication: No family at bedside  Disposition Plan: likely home when medically ready for discharge   Consultants:    Procedures:     Antibiotics:   Anti-infectives (From admission, onward)   Start     Dose/Rate Route Frequency Ordered Stop   02/04/18 2130  cefTRIAXone (ROCEPHIN) 1 g in sodium chloride 0.9 % 100 mL IVPB     1 g 200 mL/hr over 30 Minutes Intravenous Every 24 hours 02/04/18 2120     02/04/18 1800  cephALEXin (KEFLEX) capsule 500 mg     500 mg Oral  Once 02/04/18 1746 02/04/18 1815       Objective   Vitals:   02/05/18 2243 02/06/18 0619 02/06/18 1027 02/06/18 1239  BP:  (!) 150/91 139/79 (!) 141/82  Pulse:  72 79 77  Resp:  17  16  Temp: 98.1 F (36.7 C) 98.5 F (36.9 C)  98.7 F (37.1 C)  TempSrc: Oral Oral  Oral  SpO2:  98%  97%  Weight:  Height:        Intake/Output Summary (Last 24 hours) at 02/06/2018 1608 Last data filed at 02/06/2018 1331 Gross per 24 hour  Intake 2571.25 ml  Output 1975 ml  Net 596.25 ml   Filed Weights   02/04/18 1357  Weight: 95.7 kg     Physical Examination:   Mouth: Oral mucosa is moist, no lesions on palate,  Neck: Supple, no deformities, masses, or tenderness Lungs: Normal respiratory effort, bilateral clear to auscultation, no crackles or wheezes.  Heart: Regular rate and rhythm, S1 and S2 normal, no murmurs, rubs auscultated Abdomen: BS normoactive,soft,nondistended,non-tender to palpation,no organomegaly Extremities: No pretibial edema, no erythema,  no cyanosis, no clubbing Neuro : Alert and oriented to time, place and person, No focal deficits      Data Reviewed: I have personally reviewed following labs and imaging studies   No results found for this or any previous visit (from the past 240 hour(s)).   Liver Function Tests: No results for input(s): AST, ALT, ALKPHOS, BILITOT, PROT, ALBUMIN in the last 168 hours. No results for input(s): LIPASE, AMYLASE in the last 168 hours. No results for input(s): AMMONIA in the last 168 hours.  Cardiac Enzymes: No results for input(s): CKTOTAL, CKMB, CKMBINDEX, TROPONINI in the last 168 hours. BNP (last 3 results) Recent Labs    12/08/17 1122 01/23/18 1010  BNP 160.8* 67.8    ProBNP (last 3 results) No results for input(s): PROBNP in the last 8760 hours.    Studies: Dg Chest 2 View  Result Date: 02/04/2018 CLINICAL DATA:  Fall.  Altered mental status. EXAM: CHEST - 2 VIEW COMPARISON:  Chest x-ray dated January 23, 2018. FINDINGS: The heart size and mediastinal contours are stable. Normal pulmonary vascularity. Stable elevation of the right hemidiaphragm and bibasilar atelectasis/scarring. No focal consolidation, pleural effusion, or pneumothorax. No acute osseous abnormality. IMPRESSION: Unchanged bibasilar atelectasis/scarring. No active cardiopulmonary disease. Electronically Signed   By: Obie Dredge M.D.   On: 02/04/2018 16:26   Dg Lumbar Spine Complete  Result Date: 02/04/2018 CLINICAL DATA:  Larey Seat, right hip pain with some shortening of the right leg, hallucinations EXAM: LUMBAR SPINE - COMPLETE 4+ VIEW COMPARISON:  Lumbar spine films of 12/04/2006 FINDINGS: The lumbar vertebrae are somewhat straightened in alignment. There is degenerative change and probable fusion at L3-4 with degenerative disc disease at L4-5. No compression deformity is seen. Neurostimulator battery pack overlies the right abdomen with the electrodes extending into the lower thoracic spine region IMPRESSION:  1. No acute fracture.  No compression deformity. 2. Degenerative change in apparent fusion at L3-4. 3. Degenerative disc disease at L4-5. Electronically Signed   By: Dwyane Dee M.D.   On: 02/04/2018 16:25   Ct Head Wo Contrast  Result Date: 02/04/2018 CLINICAL DATA:  Intermittent hallucinations for the last few days. Recent fall. Patient is anticoagulated. EXAM: CT HEAD WITHOUT CONTRAST CT CERVICAL SPINE WITHOUT CONTRAST TECHNIQUE: Multidetector CT imaging of the head and cervical spine was performed following the standard protocol without intravenous contrast. Multiplanar CT image reconstructions of the cervical spine were also generated. COMPARISON:  December 16, 2017 FINDINGS: CT HEAD FINDINGS Brain: No subdural, epidural, or subarachnoid hemorrhage. No mass effect or midline shift. Ventricles and sulci are prominent but stable. Cerebellum, brainstem, and basal cisterns are normal. Scattered white matter changes. No acute cortical ischemia or infarct. A lacunar infarct is seen in the left basal ganglia on series 4, image 17, unchanged. Vascular: Calcified atherosclerosis in the intracranial carotids. Skull: Normal.  Negative for fracture or focal lesion. Sinuses/Orbits: No acute finding. Other: None. CT CERVICAL SPINE FINDINGS Alignment: No traumatic malalignment. Skull base and vertebrae: No acute fracture. No primary bone lesion or focal pathologic process. Soft tissues and spinal canal: No prevertebral fluid or swelling. No visible canal hematoma. Disc levels:  Multilevel degenerative changes. Upper chest: Negative. Other: No other abnormalities. IMPRESSION: 1. No acute intracranial abnormalities identified. Chronic white matter changes. 2. No traumatic malalignment or fracture in the cervical spine. Multilevel degenerative changes. Electronically Signed   By: Gerome Sam III M.D   On: 02/04/2018 16:53   Ct Cervical Spine Wo Contrast  Result Date: 02/04/2018 CLINICAL DATA:  Intermittent hallucinations  for the last few days. Recent fall. Patient is anticoagulated. EXAM: CT HEAD WITHOUT CONTRAST CT CERVICAL SPINE WITHOUT CONTRAST TECHNIQUE: Multidetector CT imaging of the head and cervical spine was performed following the standard protocol without intravenous contrast. Multiplanar CT image reconstructions of the cervical spine were also generated. COMPARISON:  December 16, 2017 FINDINGS: CT HEAD FINDINGS Brain: No subdural, epidural, or subarachnoid hemorrhage. No mass effect or midline shift. Ventricles and sulci are prominent but stable. Cerebellum, brainstem, and basal cisterns are normal. Scattered white matter changes. No acute cortical ischemia or infarct. A lacunar infarct is seen in the left basal ganglia on series 4, image 17, unchanged. Vascular: Calcified atherosclerosis in the intracranial carotids. Skull: Normal. Negative for fracture or focal lesion. Sinuses/Orbits: No acute finding. Other: None. CT CERVICAL SPINE FINDINGS Alignment: No traumatic malalignment. Skull base and vertebrae: No acute fracture. No primary bone lesion or focal pathologic process. Soft tissues and spinal canal: No prevertebral fluid or swelling. No visible canal hematoma. Disc levels:  Multilevel degenerative changes. Upper chest: Negative. Other: No other abnormalities. IMPRESSION: 1. No acute intracranial abnormalities identified. Chronic white matter changes. 2. No traumatic malalignment or fracture in the cervical spine. Multilevel degenerative changes. Electronically Signed   By: Gerome Sam III M.D   On: 02/04/2018 16:53   US Renal  Result Date: 02/05/2018 CLINICAL DATA:  Urinary tract infection for 1 day. EXAM: RENAL / URINARY TRACT ULTRASOUND COMPLETE COMPARISON:  None. FINDINGS: Right Kidney: Length: 10.9 cm. Diffusely increased parenchymal echotexture with mild parenchymal thinning. Small cysts in the midpole measuring 1.6 cm maximal diameter. No hydronephrosis. Left Kidney: Length: 10.5 cm. Diffusely increased  parenchymal echotexture with mild parenchymal thinning. Cyst on the midpole measuring about 4.1 cm diameter. Tiny cyst in the upper pole measuring about 9 mm diameter. No hydronephrosis. Bladder: Bladder is incompletely distended. Bladder wall is thickened. This could be due to under distention or cystitis. No filling defects. IMPRESSION: 1. Bilateral renal parenchymal atrophy and increased echotexture suggesting chronic medical renal disease. 2. Benign-appearing bilateral renal cysts.  No hydronephrosis. 3. Bladder wall thickening could be due to under distention or cystitis. Electronically Signed   By: Burman Nieves M.D.   On: 02/05/2018 03:12   Dg Hip Unilat W Or Wo Pelvis 2-3 Views Right  Result Date: 02/04/2018 CLINICAL DATA:  Right hip pain EXAM: DG HIP (WITH OR WITHOUT PELVIS) 2-3V RIGHT COMPARISON:  None. FINDINGS: There is no hip fracture or dislocation. There is a nondisplaced fracture of the right superior pubic ramus. There is generalized osteopenia. There is no evidence of arthropathy or other focal bone abnormality. There is peripheral vascular atherosclerotic disease. IMPRESSION: Nondisplaced fracture of the right superior pubic ramus. Given the patient's age and osteopenia, if there is persistent clinical concern for an occult hip fracture, a MRI  of the hip is recommended for increased sensitivity. Electronically Signed   By: Elige Ko   On: 02/04/2018 16:24    Scheduled Meds: . acetaminophen  650 mg Oral Q6H  . atorvastatin  80 mg Oral Daily  . enoxaparin (LOVENOX) injection  40 mg Subcutaneous Q24H  . [START ON 02/07/2018] Influenza vac split quadrivalent PF  0.5 mL Intramuscular Tomorrow-1000  . insulin aspart  0-9 Units Subcutaneous TID WC  . latanoprost  1 drop Left Eye QHS  . metoprolol succinate  25 mg Oral Daily  . prednisoLONE acetate  1 drop Right Eye QHS  . sertraline  50 mg Oral QHS  . ticagrelor  90 mg Oral BID  . timolol  1 drop Right Eye Daily      Time spent:  25 min  Meredeth Ide   [facility] Pager [phone]. If 7PM-7AM, please contact night-coverage at [email or web address], Office  [phone]  password [email or web address]  02/06/2018, 4:08 PM  LOS: 2 days

## 2018-02-06 NOTE — Progress Notes (Signed)
[facility]  Patient Status: Active (receiving services up to time of hospitalization)  [facility] is providing the following services: RN, PT and OT  If patient discharges after hours, please call [phone].   [name] 02/06/2018, 10:19 AM

## 2018-02-06 NOTE — Progress Notes (Signed)
PT Cancellation Note  Patient Details Name: [name] MRN: [id number] DOB: 04-Mar-1931   Cancelled Treatment:    Reason Eval/Treat Not Completed: Patient not medically ready. Still awaiting further imaging and weightbearing orders prior to PT evaluation.  Laurina Bustle, PT, DPT Acute [facility] Services Pager [phone] Office [phone]      [name] 02/06/2018, 3:42 PM

## 2018-02-07 ENCOUNTER — Ambulatory Visit (INDEPENDENT_AMBULATORY_CARE_PROVIDER_SITE_OTHER): Payer: Medicare HMO | Admitting: Physician Assistant

## 2018-02-07 ENCOUNTER — Inpatient Hospital Stay (HOSPITAL_COMMUNITY): Payer: Medicare Other

## 2018-02-07 DIAGNOSIS — I428 Other cardiomyopathies: Secondary | ICD-10-CM

## 2018-02-07 DIAGNOSIS — G9341 Metabolic encephalopathy: Secondary | ICD-10-CM | POA: Diagnosis not present

## 2018-02-07 DIAGNOSIS — N390 Urinary tract infection, site not specified: Secondary | ICD-10-CM | POA: Diagnosis not present

## 2018-02-07 DIAGNOSIS — S32511A Fracture of superior rim of right pubis, initial encounter for closed fracture: Secondary | ICD-10-CM | POA: Diagnosis not present

## 2018-02-07 DIAGNOSIS — G9340 Encephalopathy, unspecified: Secondary | ICD-10-CM | POA: Diagnosis not present

## 2018-02-07 LAB — CBC
HCT: 31.6 % — ABNORMAL LOW (ref 39.0–52.0)
Hemoglobin: 10.1 g/dL — ABNORMAL LOW (ref 13.0–17.0)
MCH: 27.8 pg (ref 26.0–34.0)
MCHC: 32 g/dL (ref 30.0–36.0)
MCV: 87.1 fL (ref 78.0–100.0)
Platelets: 166 10*3/uL (ref 150–400)
RBC: 3.63 MIL/uL — ABNORMAL LOW (ref 4.22–5.81)
RDW: 14.9 % (ref 11.5–15.5)
WBC: 5.3 10*3/uL (ref 4.0–10.5)

## 2018-02-07 LAB — GLUCOSE, CAPILLARY
Glucose-Capillary: 85 mg/dL (ref 70–99)
Glucose-Capillary: 116 mg/dL — ABNORMAL HIGH (ref 70–99)
Glucose-Capillary: 143 mg/dL — ABNORMAL HIGH (ref 70–99)
Glucose-Capillary: 116 mg/dL — ABNORMAL HIGH (ref 70–99)

## 2018-02-07 LAB — URINE CULTURE: Culture: NO GROWTH

## 2018-02-07 LAB — BASIC METABOLIC PANEL
Anion gap: 7 (ref 5–15)
BUN: 13 mg/dL (ref 8–23)
CO2: 23 mmol/L (ref 22–32)
Calcium: 8.5 mg/dL — ABNORMAL LOW (ref 8.9–10.3)
Chloride: 108 mmol/L (ref 98–111)
Creatinine, Ser: 1.2 mg/dL (ref 0.61–1.24)
GFR calc Af Amer: >60 mL/min (ref >60)
GFR calc non Af Amer: 54 mL/min — ABNORMAL LOW (ref >60)
Glucose, Bld: 97 mg/dL (ref 70–99)
Potassium: 3.5 mmol/L (ref 3.5–5.1)
Sodium: 138 mmol/L (ref 135–145)

## 2018-02-07 NOTE — Progress Notes (Signed)
PT Cancellation Note  Patient Details Name: ROLFE BENSINGER MRN: [id number] DOB: 12/23/1930   Cancelled Treatment:    Reason Eval/Treat Not Completed: Patient not medically ready Still awaiting further imaging and weightbearing orders prior to PT evaluation. PT paging MD to update WB and activity orders once cleared.   Kipp Laurence, PT, DPT 02/07/18 1:05 PM Pager: [phone]

## 2018-02-07 NOTE — Care Management Important Message (Signed)
Important Message  Patient Details  Name: [name] MRN: [id number] Date of Birth: [birth year]   Medicare Important Message Given:  No  Due to illness patient could not sign.  Aneth Schlagel 02/07/2018, 3:14 PM

## 2018-02-07 NOTE — Progress Notes (Signed)
[facility]  PROGRESS NOTE  [name] ZDG:[id number] DOB: 1930/12/17 DOA: 02/04/2018 PCP: Clayborn Heron, MD   Brief HPI:    84 year old male with a history of CAD status post PCI in July 2019, chronic systolic heart failure, blindness, type 2 diabetes mellitus, dyslipidemia, obesity was brought to hospital after patient fell from couch.  Patient experienced significant right hip pain and was brought to the ED for further evaluation.  In the ED patient was found to be confused and was found to have abnormal UA started on IV ceftriaxone for UTI.  X-ray of the pelvis with right hip showed superior pubic ramus fracture   Subjective   Patient seen and examined, continues to have right hip pain.  MRI of the hip was not performed was a question of stimulator in his back   Assessment/Plan:     1. Encephalopathy-resolved, likely from underlying UTI.  Patient is on IV antibiotics.  2. UTI-patient had urine culture done on 01/23/2018 which showed Klebsiella pneumoniae, Sensitive to ceftriaxone.  Patient had abnormal UA at the time of admission with positive nitrite.  Urine culture was not obtained, will obtain urine cultures, continue with IV ceftriaxone empirically.  Repeat urine cultures in the hospital showed no growth.  3. Right hip pain-discussed with radiologist, x-ray of the hip showed superior pubic ramus fracture, patient does have significant osteopenia and recommended MRI of the right hip to rule out occult hip fracture.  MRI of the right hip has been ordered, was not done as there was a question of stimulator in back.  Will order CT of the right hip  4. Right superior pubic ramus fracture-we will get PT evaluation after CT of the right hip.  5. CAD-stable, continue atorvastatin, ticagrelor  6. Diabetes mellitus type 2-diet controlled, continue sliding scale insulin with NovoLog.  7. Chronic kidney disease stage III- serum creatinine 1.20, at  baseline.  8. Hypertension-blood pressure stable, continue metoprolol 25 mg daily.     CBG: Recent Labs  Lab 02/06/18 0817 02/06/18 1112 02/06/18 1743 02/06/18 2227 02/07/18 0740  GLUCAP 78 151* 130* 137* 85    CBC: Recent Labs  Lab 02/04/18 1355 02/05/18 0403 02/07/18 0829  WBC 5.4 5.2 5.3  NEUTROABS 3.3  --   --   HGB 11.1* 11.1* 10.1*  HCT 35.7* 35.1* 31.6*  MCV 89.9 89.1 87.1  PLT 185 181 166    Basic Metabolic Panel: Recent Labs  Lab 02/04/18 1355 02/05/18 0403 02/07/18 0829  NA 139 141 138  K 3.8 4.0 3.5  CL 108 108 108  CO2 23 26 23   GLUCOSE 105* 89 97  BUN 14 12 13   CREATININE 1.31* 1.18 1.20  CALCIUM 9.0 8.7* 8.5*     DVT prophylaxis: Lovenox  Code Status: Full code  Family Communication: No family at bedside  Disposition Plan: likely home when medically ready for discharge   Consultants:    Procedures:     Antibiotics:   Anti-infectives (From admission, onward)   Start     Dose/Rate Route Frequency Ordered Stop   02/04/18 2130  cefTRIAXone (ROCEPHIN) 1 g in sodium chloride 0.9 % 100 mL IVPB     1 g 200 mL/hr over 30 Minutes Intravenous Every 24 hours 02/04/18 2120     02/04/18 1800  cephALEXin (KEFLEX) capsule 500 mg     500 mg Oral  Once 02/04/18 1746 02/04/18 1815       Objective   Vitals:   02/06/18 1239 02/06/18  2228 02/07/18 0626 02/07/18 1013  BP: (!) 141/82 138/80 (!) 151/87 (!) 154/86  Pulse: 77 76 79 84  Resp: 16 17 17    Temp: 98.7 F (37.1 C) 98.1 F (36.7 C) 98.4 F (36.9 C)   TempSrc: Oral Oral Oral   SpO2: 97% 100% 94%   Weight:      Height:        Intake/Output Summary (Last 24 hours) at 02/07/2018 1041 Last data filed at 02/07/2018 0641 Gross per 24 hour  Intake 1720 ml  Output 1575 ml  Net 145 ml   Filed Weights   02/04/18 1357  Weight: 95.7 kg     Physical Examination:   Mouth: Oral mucosa is moist, no lesions on palate,  Neck: Supple, no deformities, masses, or tenderness Lungs:  Normal respiratory effort, bilateral clear to auscultation, no crackles or wheezes.  Heart: Regular rate and rhythm, S1 and S2 normal, no murmurs, rubs auscultated Abdomen: BS normoactive,soft,nondistended,non-tender to palpation,no organomegaly Extremities: Tenderness at right hip  Neuro : Alert and oriented to time, place and person, No focal deficits      Data Reviewed: I have personally reviewed following labs and imaging studies   Recent Results (from the past 240 hour(s))  Culture, Urine     Status: None   Collection Time: 02/05/18 10:48 PM  Result Value Ref Range Status   Specimen Description URINE, CLEAN CATCH  Final   Special Requests NONE  Final   Culture   Final    NO GROWTH Performed at [facility], [address], [place], [place] [address]    Report Status 02/07/2018 FINAL  Final     Liver Function Tests: No results for input(s): AST, ALT, ALKPHOS, BILITOT, PROT, ALBUMIN in the last 168 hours. No results for input(s): LIPASE, AMYLASE in the last 168 hours. No results for input(s): AMMONIA in the last 168 hours.  Cardiac Enzymes: No results for input(s): CKTOTAL, CKMB, CKMBINDEX, TROPONINI in the last 168 hours. BNP (last 3 results) Recent Labs    12/08/17 1122 01/23/18 1010  BNP 160.8* 67.8    ProBNP (last 3 results) No results for input(s): PROBNP in the last 8760 hours.    Studies: No results found.  Scheduled Meds: . acetaminophen  650 mg Oral Q6H  . atorvastatin  80 mg Oral Daily  . enoxaparin (LOVENOX) injection  40 mg Subcutaneous Q24H  . Influenza vac split quadrivalent PF  0.5 mL Intramuscular Tomorrow-1000  . insulin aspart  0-9 Units Subcutaneous TID WC  . latanoprost  1 drop Left Eye QHS  . metoprolol succinate  25 mg Oral Daily  . prednisoLONE acetate  1 drop Right Eye QHS  . sertraline  50 mg Oral QHS  . ticagrelor  90 mg Oral BID  . timolol  1 drop Right Eye Daily      Time spent: 25 min  Meredeth Ide   [facility] Pager [phone]. If 7PM-7AM, please contact night-coverage at [email or web address], Office  [phone]  password [email or web address]  02/07/2018, 10:41 AM  LOS: 3 days

## 2018-02-08 ENCOUNTER — Inpatient Hospital Stay (HOSPITAL_COMMUNITY): Payer: Medicare Other

## 2018-02-08 DIAGNOSIS — G9341 Metabolic encephalopathy: Secondary | ICD-10-CM | POA: Diagnosis not present

## 2018-02-08 DIAGNOSIS — G9340 Encephalopathy, unspecified: Secondary | ICD-10-CM | POA: Diagnosis not present

## 2018-02-08 DIAGNOSIS — S32511A Fracture of superior rim of right pubis, initial encounter for closed fracture: Secondary | ICD-10-CM | POA: Diagnosis not present

## 2018-02-08 DIAGNOSIS — N390 Urinary tract infection, site not specified: Secondary | ICD-10-CM | POA: Diagnosis not present

## 2018-02-08 LAB — GLUCOSE, CAPILLARY
Glucose-Capillary: 82 mg/dL (ref 70–99)
Glucose-Capillary: 106 mg/dL — ABNORMAL HIGH (ref 70–99)
Glucose-Capillary: 161 mg/dL — ABNORMAL HIGH (ref 70–99)
Glucose-Capillary: 133 mg/dL — ABNORMAL HIGH (ref 70–99)

## 2018-02-08 NOTE — Plan of Care (Signed)
°  Problem: Education: °Goal: Knowledge of General Education information will improve °Description: Including pain rating scale, medication(s)/side effects and non-pharmacologic comfort measures °Outcome: Progressing °  °Problem: Clinical Measurements: °Goal: Ability to maintain clinical measurements within normal limits will improve °Outcome: Progressing °  °Problem: Nutrition: °Goal: Adequate nutrition will be maintained °Outcome: Progressing °  °

## 2018-02-08 NOTE — Progress Notes (Signed)
[facility]  PROGRESS NOTE  [name] WUX:[id number] DOB: 1930/11/03 DOA: 02/04/2018 PCP: Clayborn Heron, MD   Brief HPI:    84 year old male with a history of CAD status post PCI in July 2019, chronic systolic heart failure, blindness, type 2 diabetes mellitus, dyslipidemia, obesity was brought to hospital after patient fell from couch.  Patient experienced significant right hip pain and was brought to the ED for further evaluation.  In the ED patient was found to be confused and was found to have abnormal UA started on IV ceftriaxone for UTI.  X-ray of the pelvis with right hip showed superior pubic ramus fracture   Subjective   Patient seen and examined, denies any pain.  CT of the right hip showed no fracture.  Ortho was consulted this morning   Assessment/Plan:     1. Encephalopathy-resolved, likely from underlying UTI.  Patient is on IV antibiotics.  2. UTI-patient had urine culture done on 01/23/2018 which showed Klebsiella pneumoniae, Sensitive to ceftriaxone.  Patient had abnormal UA at the time of admission with positive nitrite.  Urine culture was not obtained, will obtain urine cultures, continue with IV ceftriaxone empirically.  Repeat urine cultures in the hospital showed no growth.  3. Right hip pain-discussed with radiologist, x-ray of the hip showed superior pubic ramus fracture, patient does have significant osteopenia and recommended MRI of the right hip to rule out occult hip fracture.  MRI of the right hip has been ordered, was not done as there was a question of stimulator in back.  CT of the right hip showed no hip fracture.  4. Right superior pubic ramus fracture-we will get orthopedics consultation for further recommendations regarding weightbearing  5. CAD-stable, continue atorvastatin, ticagrelor  6. Diabetes mellitus type 2-diet controlled, continue sliding scale insulin with NovoLog.  7. Chronic kidney disease stage III- serum creatinine  1.20, at baseline.  8. Hypertension-blood pressure stable, continue metoprolol 25 mg daily.     CBG: Recent Labs  Lab 02/07/18 1203 02/07/18 1627 02/07/18 2207 02/08/18 0826 02/08/18 1303  GLUCAP 116* 143* 116* 82 106*    CBC: Recent Labs  Lab 02/04/18 1355 02/05/18 0403 02/07/18 0829  WBC 5.4 5.2 5.3  NEUTROABS 3.3  --   --   HGB 11.1* 11.1* 10.1*  HCT 35.7* 35.1* 31.6*  MCV 89.9 89.1 87.1  PLT 185 181 166    Basic Metabolic Panel: Recent Labs  Lab 02/04/18 1355 02/05/18 0403 02/07/18 0829  NA 139 141 138  K 3.8 4.0 3.5  CL 108 108 108  CO2 23 26 23   GLUCOSE 105* 89 97  BUN 14 12 13   CREATININE 1.31* 1.18 1.20  CALCIUM 9.0 8.7* 8.5*     DVT prophylaxis: Lovenox  Code Status: Full code  Family Communication: No family at bedside  Disposition Plan: likely home when medically ready for discharge   Consultants:    Procedures:     Antibiotics:   Anti-infectives (From admission, onward)   Start     Dose/Rate Route Frequency Ordered Stop   02/04/18 2130  cefTRIAXone (ROCEPHIN) 1 g in sodium chloride 0.9 % 100 mL IVPB     1 g 200 mL/hr over 30 Minutes Intravenous Every 24 hours 02/04/18 2120     02/04/18 1800  cephALEXin (KEFLEX) capsule 500 mg     500 mg Oral  Once 02/04/18 1746 02/04/18 1815       Objective   Vitals:   02/07/18 1343 02/07/18 2204 02/08/18 2536  02/08/18 1418  BP: (!) 146/79 (!) 146/77 (!) 141/76 (!) 142/79  Pulse: 78 75 87 77  Resp: 20 20 18 14   Temp: 97.9 F (36.6 C) 98.1 F (36.7 C)  98 F (36.7 C)  TempSrc: Oral Oral    SpO2: 99% 98% 98% 95%  Weight:      Height:        Intake/Output Summary (Last 24 hours) at 02/08/2018 1454 Last data filed at 02/08/2018 1008 Gross per 24 hour  Intake 1884.9 ml  Output 1800 ml  Net 84.9 ml   Filed Weights   02/04/18 1357  Weight: 95.7 kg     Physical Examination:   Mouth: Oral mucosa is moist, no lesions on palate,  Neck: Supple, no deformities, masses, or  tenderness Lungs: Normal respiratory effort, bilateral clear to auscultation, no crackles or wheezes.  Heart: Regular rate and rhythm, S1 and S2 normal, no murmurs, rubs auscultated Abdomen: BS normoactive,soft,nondistended,non-tender to palpation,no organomegaly Extremities:  NeTenderness of right hip on palpation Neuro : Alert and oriented to time, place and person, No focal deficits      Data Reviewed: I have personally reviewed following labs and imaging studies   Recent Results (from the past 240 hour(s))  Culture, Urine     Status: None   Collection Time: 02/05/18 10:48 PM  Result Value Ref Range Status   Specimen Description URINE, CLEAN CATCH  Final   Special Requests NONE  Final   Culture   Final    NO GROWTH Performed at [facility], [address] [place], [place] [address]    Report Status 02/07/2018 FINAL  Final     Liver Function Tests: No results for input(s): AST, ALT, ALKPHOS, BILITOT, PROT, ALBUMIN in the last 168 hours. No results for input(s): LIPASE, AMYLASE in the last 168 hours. No results for input(s): AMMONIA in the last 168 hours.  Cardiac Enzymes: No results for input(s): CKTOTAL, CKMB, CKMBINDEX, TROPONINI in the last 168 hours. BNP (last 3 results) Recent Labs    12/08/17 1122 01/23/18 1010  BNP 160.8* 67.8    ProBNP (last 3 results) No results for input(s): PROBNP in the last 8760 hours.    Studies: Ct Hip Right Wo Contrast  Result Date: 02/07/2018 CLINICAL DATA:  Right hip pain.  Right pubic ramus fractures. EXAM: CT OF THE RIGHT HIP WITHOUT CONTRAST TECHNIQUE: Multidetector CT imaging of the right hip was performed according to the standard protocol. Multiplanar CT image reconstructions were also generated. COMPARISON:  Radiographs dated 02/04/2018 FINDINGS: Bones/Joint/Cartilage There is a comminuted fracture of the right superior pubic ramus adjacent to the pubic body. There is no discrete inferior pubic ramus fracture. The  other visualized pelvic bones are intact. Proximal femur is intact. Slight diffuse joint space narrowing of the hips. Muscles and Tendons Normal. Soft tissues Soft tissue contusion in the subcutaneous fat lateral to the right hip. IMPRESSION: 1. Comminuted fracture of the right superior pubic ramus. 2. Soft tissue contusion lateral to the right hip. 3. Proximal right femur is intact. Electronically Signed   By: Francene Boyers M.D.   On: 02/07/2018 15:02    Scheduled Meds: . acetaminophen  650 mg Oral Q6H  . atorvastatin  80 mg Oral Daily  . enoxaparin (LOVENOX) injection  40 mg Subcutaneous Q24H  . Influenza vac split quadrivalent PF  0.5 mL Intramuscular Tomorrow-1000  . insulin aspart  0-9 Units Subcutaneous TID WC  . latanoprost  1 drop Left Eye QHS  .  metoprolol succinate  25 mg Oral Daily  . prednisoLONE acetate  1 drop Right Eye QHS  . sertraline  50 mg Oral QHS  . ticagrelor  90 mg Oral BID  . timolol  1 drop Right Eye Daily      Time spent: 25 min  Meredeth Ide   [facility] Pager [phone]. If 7PM-7AM, please contact night-coverage at [email or web address], Office  [phone]  password [email or web address]  02/08/2018, 2:54 PM  LOS: 4 days

## 2018-02-08 NOTE — Consult Note (Signed)
[facility]thopaedic Trauma Service (OTS) Consult   Patient ID: [name] MRN: [id number] DOB/AGE: 12/04/1930 84 y.o.  Reason for Consult:Pelvic fracture Referring Physician: Dr. Eleonore Chiquito, MD [facility]  HPI: [name] is an 84 y.o. male who is being seen in consultation at the request of Dr. Darrick Meigs for evaluation of pelvic ring injury.  Patient has a history of significant coronary artery disease, systolic heart failure, blindness and type 2 diabetes.  He was his usual state of health when he was on his couch he fell and lost balance.  He landed on his right hip and he was unable to ambulate.  He stated that he had 10 out of 10 pain.  He was brought to the emergency room.  X-rays show a superior pubic rami fracture.  CT scan was obtained as well which showed no involvement of the proximal femur. In the emergency room the patient was confused and a UA showed a UTI and he was started on IV ceftriaxone.  Currently complaining of right hip pain.  Hurts when he moves.  When he is lying flat in bed it does not bother him very much.  Past Medical History:  Diagnosis Date  . Arthritis    "all over" (11/12/2017)  . CAD (coronary artery disease) 11/11/2017   S/p IL STEMI 6/19:  S/p DES to pLCx and DES x 2 to OM2 // staged PCI 6/19:  DES x 2 to RCA // residual CAD at cath 6/19:  mLAD 80, dLAD 90, D1 95 tx medically // EF 40-45 by echo  . Chronic combined systolic and diastolic CHF (congestive heart failure) (HCC)    ischemic CM // s/p MI in 11/2017 >> Echo 6/19:  Moderate concentric LVH, EF 83-41, grade 1 diastolic dysfunction, inferior, inferolateral, apical inferior akinesis   . Chronic lower back pain   . CKD (chronic kidney disease), stage III ([place])   . Constipation    takes an OTC med as needed  . Diabetic nephropathy ([place])   . Gastric ulcer 1970s  . Glaucoma, both eyes    uses eye drops daily  . Gout    takes Allopurinol daily  . History of colon polyps   . History of ST  elevation myocardial infarction (STEMI) 11/10/2017   PCI to LCx and staged PCI to RCA  . History of stress test    a. 09/2014 MV: EF 53%, no ischemia/infarct.  . Hyperlipidemia    takes Pravastatin daily  . Hypertension    takes Amlodipine and Losartan daily  . Insomnia    uses OTC sleep aide  . Joint pain   . Joint swelling    right knee   . Legally blind   . Macular degeneration   . Morbid obesity ([place])   . Nocturia   . Peripheral edema   . Pleurisy 20+yrs ago  . Pneumonia    "I think once when I was a kid" (11/12/2017)  . Sleep apnea   . Stroke [facility])    "I've had them; don't know anything about them"; denies residual on 11/12/2017)  . Syncope    a. 09/2014 in setting of orthostasis; b. 10/2014 Event monitor: 19 beat run of asymp NSVT; c. 01/2015 Echo: EF 60-65%, no rwma.  . Type II diabetes mellitus ([place])    takes Metformin daily  . Urinary frequency    takes Flomax daily    Past Surgical History:  Procedure Laterality Date  . BACK SURGERY    . CATARACT  EXTRACTION W/ INTRAOCULAR LENS  IMPLANT, BILATERAL Bilateral   . COLONOSCOPY    . CORONARY ANGIOGRAPHY N/A 11/12/2017   Procedure: CORONARY ANGIOGRAPHY;  Surgeon: Troy Sine, MD;  Location: [place] [facility];  Service: Cardiovascular;  Laterality: N/A;  . CORONARY STENT INTERVENTION N/A 11/12/2017   Procedure: CORONARY STENT INTERVENTION;  Surgeon: Troy Sine, MD;  Location: [place] [facility];  Service: Cardiovascular;  Laterality: N/A;  . CORONARY/GRAFT ACUTE MI REVASCULARIZATION N/A 11/10/2017   Procedure: Coronary/Graft Acute MI Revascularization;  Surgeon: Troy Sine, MD;  Location: [place] [facility];  Service: Cardiovascular;  Laterality: N/A;  . EYE SURGERY    . GLAUCOMA SURGERY Bilateral   . INSERTION / PLACEMENT / REVISION NEUROSTIMULATOR  2000s  . KNEE ARTHROSCOPY Right 1990s  . LEFT HEART CATH AND CORONARY ANGIOGRAPHY N/A 11/10/2017   Procedure: LEFT HEART CATH AND CORONARY ANGIOGRAPHY;  Surgeon:  Troy Sine, MD;  Location: [place] [facility];  Service: Cardiovascular;  Laterality: N/A;  . MINI SHUNT INSERTION  09/26/2011   Procedure: INSERTION OF MINI SHUNT;  Surgeon: Marylynn Pearson, MD;  Location: [place];  Service: Ophthalmology;  Laterality: Right;  Insertion of Ahmed shunt  . POSTERIOR FUSION LUMBAR SPINE  1990s  . REVERSE SHOULDER ARTHROPLASTY Right 09/21/2013   Procedure: RIGHT  SHOULDER ARTHROPLASTY REVERSE ;  Surgeon: Augustin Schooling, MD;  Location: [place];  Service: Orthopedics;  Laterality: Right;  . SHOULDER INJECTION Left 09/21/2013   Procedure: SHOULDER INJECTION;  Surgeon: Augustin Schooling, MD;  Location: [place];  Service: Orthopedics;  Laterality: Left;    Family History  Problem Relation Age of Onset  . Cancer Mother   . Alzheimer's disease Maternal Aunt   . Anesthesia problems Neg Hx     Social History:  reports that he has quit smoking. His smoking use included cigarettes. He smoked 2.00 packs per day. He has never used smokeless tobacco. He reports that he drank alcohol. He reports that he does not use drugs.  Allergies: No Known Allergies  Medications:  No current facility-administered medications on file prior to encounter.    Current Outpatient Medications on File Prior to Encounter  Medication Sig Dispense Refill  . acetaminophen (TYLENOL) 500 MG tablet Take 1,000 mg by mouth as needed for mild pain.    Marland Kitchen aspirin EC 81 MG EC tablet Take 1 tablet (81 mg total) by mouth daily.    Marland Kitchen atorvastatin (LIPITOR) 80 MG tablet Take 1 tablet (80 mg total) by mouth daily. 30 tablet 5  . furosemide (LASIX) 20 MG tablet Take 20 mg by mouth daily.  0  . loperamide (IMODIUM) 2 MG capsule Take 2 mg by mouth as needed for diarrhea or loose stools.    . Menthol-Camphor (ICY HOT ADVANCED RELIEF) 16-11 % CREA Apply 1 application topically as needed (knee pain).    . metoprolol succinate (TOPROL-XL) 25 MG 24 hr tablet Take 1 tablet (25 mg total) by mouth daily. 30 tablet 5  .  nitroGLYCERIN (NITROSTAT) 0.4 MG SL tablet Place 1 tablet (0.4 mg total) under the tongue every 5 (five) minutes x 3 doses as needed for chest pain. 25 tablet 2  . prednisoLONE acetate (PRED FORTE) 1 % ophthalmic suspension Place 1 drop into the right eye at bedtime. 5 mL 0  . sertraline (ZOLOFT) 50 MG tablet Take 1 tablet (50 mg total) by mouth at bedtime. 30 tablet 0  . ticagrelor (BRILINTA) 90 MG TABS tablet Take 1  tablet (90 mg total) by mouth 2 (two) times daily. 60 tablet 11  . timolol (TIMOPTIC) 0.5 % ophthalmic solution Place 1 drop into the right eye daily. 5 mL 0  . TRAVATAN Z 0.004 % SOLN ophthalmic solution Place 1 drop into the left eye at bedtime. 2.5 mL 0  . HYDROcodone-acetaminophen (NORCO/VICODIN) 5-325 MG tablet Take 1 tablet by mouth every 4 (four) hours as needed for moderate pain. (Patient not taking: Reported on 01/23/2018) 15 tablet 0    ROS: Constitutional: No fever or chills Vision: No changes in vision ENT: No difficulty swallowing CV: No chest pain Pulm: No SOB or wheezing GI: No nausea or vomiting GU: No urgency or inability to hold urine Skin: No poor wound healing Neurologic: No numbness or tingling Psychiatric: No depression or anxiety Heme: No bruising Allergic: No reaction to medications or food   Exam: Blood pressure (!) 141/76, pulse 87, temperature 98.1 F (36.7 C), temperature source Oral, resp. rate 18, height 5' 10"  (1.778 m), weight 95.7 kg, SpO2 98 %. General: No acute distress Orientation: Awake and alert Mood and Affect: Cooperative and pleasant Gait: Unable to assess due to pain Coordination and balance: Within normal limits  Pelvis/right lower extremity: Pain with lateral compression of the pelvis and hip.  Gentle range of motion of the hip and knee without any discomfort.  No obvious deformity.  Compartments are soft and compressible.  Motor and sensory function intact.  Warm well-perfused foot.  Left lower extremity: Skin without  lesions. No tenderness to palpation. Full painless ROM, full strength in each muscle groups without evidence of instability.   Medical Decision Making: Imaging: X-rays of the right hip and CT scan of the pelvis shows a comminuted superior pubic rami fracture.  No significant involvement of the posterior pelvic ring.  No involvement of the femoral neck or proximal femur.  Labs:  Results for orders placed or performed during the hospital encounter of 02/04/18 (from the past 48 hour(s))  Glucose, capillary     Status: Abnormal   Collection Time: 02/06/18 11:12 AM  Result Value Ref Range   Glucose-Capillary 151 (H) 70 - 99 mg/dL  Glucose, capillary     Status: Abnormal   Collection Time: 02/06/18  5:43 PM  Result Value Ref Range   Glucose-Capillary 130 (H) 70 - 99 mg/dL  Glucose, capillary     Status: Abnormal   Collection Time: 02/06/18 10:27 PM  Result Value Ref Range   Glucose-Capillary 137 (H) 70 - 99 mg/dL  Glucose, capillary     Status: None   Collection Time: 02/07/18  7:40 AM  Result Value Ref Range   Glucose-Capillary 85 70 - 99 mg/dL  CBC     Status: Abnormal   Collection Time: 02/07/18  8:29 AM  Result Value Ref Range   WBC 5.3 4.0 - 10.5 K/uL   RBC 3.63 (L) 4.22 - 5.81 MIL/uL   Hemoglobin 10.1 (L) 13.0 - 17.0 g/dL   HCT 31.6 (L) 39.0 - 52.0 %   MCV 87.1 78.0 - 100.0 fL   MCH 27.8 26.0 - 34.0 pg   MCHC 32.0 30.0 - 36.0 g/dL   RDW 14.9 11.5 - 15.5 %   Platelets 166 150 - 400 K/uL    Comment: Performed at [facility], [place] [address] [place], [place] [address]  Basic metabolic panel     Status: Abnormal   Collection Time: 02/07/18  8:29 AM  Result Value Ref Range   Sodium  138 135 - 145 mmol/L   Potassium 3.5 3.5 - 5.1 mmol/L   Chloride 108 98 - 111 mmol/L   CO2 23 22 - 32 mmol/L   Glucose, Bld 97 70 - 99 mg/dL   BUN 13 8 - 23 mg/dL   Creatinine, Ser 1.20 0.61 - 1.24 mg/dL   Calcium 8.5 (L) 8.9 - 10.3 mg/dL   GFR calc non Af Amer 54 (L) >60 mL/min   GFR  calc Af Amer >60 >60 mL/min    Comment: (NOTE) The eGFR has been calculated using the CKD EPI equation. This calculation has not been validated in all clinical situations. eGFR's persistently <60 mL/min signify possible Chronic Kidney Disease.    Anion gap 7 5 - 15    Comment: Performed at [facility] [address] [place],  [address]  Glucose, capillary     Status: Abnormal   Collection Time: 02/07/18 12:03 PM  Result Value Ref Range   Glucose-Capillary 116 (H) 70 - 99 mg/dL  Glucose, capillary     Status: Abnormal   Collection Time: 02/07/18  4:27 PM  Result Value Ref Range   Glucose-Capillary 143 (H) 70 - 99 mg/dL  Glucose, capillary     Status: Abnormal   Collection Time: 02/07/18 10:07 PM  Result Value Ref Range   Glucose-Capillary 116 (H) 70 - 99 mg/dL  Glucose, capillary     Status: None   Collection Time: 02/08/18  8:26 AM  Result Value Ref Range   Glucose-Capillary 82 70 - 99 mg/dL   Medical history and chart was reviewed  Assessment/Plan: 84 year old male with a history of coronary artery disease, systolic heart failure, blindness, type 2 diabetes, obesity and a mechanical fall that has a right superior pubic rami fracture.  I reviewed the images and feel that this fracture pattern can be treated nonoperatively.  He may be weightbearing as tolerated to the right lower extremity.  We will obtain formal pelvic images for comparison and follow-up.  No role for orthopedic surgical intervention.  Continue treatment per hospitalist.  Plan to return to see me in about 1 month with repeat x-rays of the pelvis.  Shona Needles, MD [facility] [phone] (phone)

## 2018-02-08 NOTE — Evaluation (Signed)
Physical Therapy Evaluation Patient Details Name: [name] MRN: [id number] DOB: 1931-02-26 Today's Date: 02/08/2018   History of Present Illness  [name] is a 84 y.o. male diet controlled DM2, macular degeneration,  legal blindness , HLD, remote lacunar infracts , h/o CAD s/p MI with DES to OM2 and Lcx with staged DESx2 to the RCA in 12/2017, chronic systolic HF (45%)  , He was brought to the hospital folling a fall when standing from his chair. He was found to have a suerior rami pelvic fx. Per MD he is WBAT.   Clinical Impression  Patient required mod a for bed mobility and trnasfers. He has pain weight bearing on the right lower extremity. He was able to transfer to a chair. His gait was limited. He required assistance because of vision. At baseline his son helped him with ADL's. He would benefit from further skilled acute PT and will likely require rehab at a SNF 2nd to stairs going into his house.     Follow Up Recommendations SNF    Equipment Recommendations  None recommended by PT    Recommendations for Other Services Rehab consult     Precautions / Restrictions Precautions Precautions: Fall Restrictions Weight Bearing Restrictions: No      Mobility  Bed Mobility Overal bed mobility: Needs Assistance Bed Mobility: Supine to Sit     Supine to sit: Mod assist     General bed mobility comments: required mod a to scoot hips to the edge of the bed. Mod a to sit up. Once sitting supervision.   Transfers Overall transfer level: Needs assistance Equipment used: Rolling walker (2 wheeled) Transfers: Sit to/from Stand Sit to Stand: Mod assist         General transfer comment: from raised bed. Reuqired mod a for strength to stand.   Ambulation/Gait Ambulation/Gait assistance: Min assist Gait Distance (Feet): 3 Feet Assistive device: Rolling walker (2 wheeled)       General Gait Details: required verbal cuing 2nd to visual deficits. Required min a  for balance and min tacilte cuing for walker palcement   Stairs            Wheelchair Mobility    Modified Rankin (Stroke Patients Only)       Balance Overall balance assessment: Needs assistance Sitting-balance support: No upper extremity supported Sitting balance-Leahy Scale: Good     Standing balance support: Bilateral upper extremity supported;During functional activity Standing balance-Leahy Scale: Poor                               Pertinent Vitals/Pain Pain Assessment: 0-10 Pain Score: 8  Pain Location: Right hip Pain Descriptors / Indicators: Aching Pain Intervention(s): Limited activity within patient's tolerance;Repositioned    Home Living Family/patient expects to be discharged to:: Skilled nursing facility Living Arrangements: Children Available Help at Discharge: Family;Available PRN/intermittently Type of Home: House Home Access: Stairs to enter Entrance Stairs-Rails: Right Entrance Stairs-Number of Steps: 3 in front, 7 in back Home Layout: One level        Prior Function Level of Independence: Needs assistance   Gait / Transfers Assistance Needed: uses walker  ADL's / Homemaking Assistance Needed: son assists wtih showering  Comments: able to ambulate househild distances with walker      Hand Dominance   Dominant Hand: Right    Extremity/Trunk Assessment   Upper Extremity Assessment Upper Extremity Assessment: Overall WFL for tasks assessed  Lower Extremity Assessment Lower Extremity Assessment: RLE deficits/detail RLE: Unable to fully assess due to pain    Cervical / Trunk Assessment Cervical / Trunk Assessment: Normal  Communication   Communication: No difficulties  Cognition Arousal/Alertness: Awake/alert Behavior During Therapy: WFL for tasks assessed/performed Overall Cognitive Status: Within Functional Limits for tasks assessed                                        General Comments       Exercises     Assessment/Plan    PT Assessment Patient needs continued PT services  PT Problem List Decreased mobility;Decreased strength;Decreased knowledge of use of DME       PT Treatment Interventions Gait training;Stair training;DME instruction;Functional mobility training;Therapeutic activities;Therapeutic exercise    PT Goals (Current goals can be found in the Care Plan section)  Acute Rehab PT Goals Patient Stated Goal: to go home PT Goal Formulation: With patient Time For Goal Achievement: 01/31/18 Potential to Achieve Goals: Good    Frequency Min 4X/week   Barriers to discharge        Co-evaluation               AM-PAC PT "6 Clicks" Daily Activity  Outcome Measure Difficulty turning over in bed (including adjusting bedclothes, sheets and blankets)?: Unable Difficulty moving from lying on back to sitting on the side of the bed? : Unable Difficulty sitting down on and standing up from a chair with arms (e.g., wheelchair, bedside commode, etc,.)?: Unable Help needed moving to and from a bed to chair (including a wheelchair)?: Total Help needed walking in hospital room?: Total Help needed climbing 3-5 steps with a railing? : Total 6 Click Score: 6    End of Session Equipment Utilized During Treatment: Gait belt Activity Tolerance: Patient tolerated treatment well Patient left: with call bell/phone within reach;in chair Nurse Communication: Mobility status PT Visit Diagnosis: Other abnormalities of gait and mobility (R26.89);Muscle weakness (generalized) (M62.81)    Time: 1610-9604 PT Time Calculation (min) (ACUTE ONLY): 25 min   Charges:   PT Evaluation $PT Eval Moderate Complexity: 1 Mod          Dessie Coma PT DPT  02/08/2018, 12:58 PM

## 2018-02-09 DIAGNOSIS — S32511D Fracture of superior rim of right pubis, subsequent encounter for fracture with routine healing: Secondary | ICD-10-CM | POA: Diagnosis not present

## 2018-02-09 DIAGNOSIS — G9341 Metabolic encephalopathy: Secondary | ICD-10-CM | POA: Diagnosis not present

## 2018-02-09 DIAGNOSIS — N390 Urinary tract infection, site not specified: Secondary | ICD-10-CM | POA: Diagnosis not present

## 2018-02-09 LAB — GLUCOSE, CAPILLARY
Glucose-Capillary: 84 mg/dL (ref 70–99)
Glucose-Capillary: 116 mg/dL — ABNORMAL HIGH (ref 70–99)
Glucose-Capillary: 154 mg/dL — ABNORMAL HIGH (ref 70–99)
Glucose-Capillary: 121 mg/dL — ABNORMAL HIGH (ref 70–99)

## 2018-02-09 MED ORDER — HALOPERIDOL LACTATE 5 MG/ML IJ SOLN
2.0000 mg | Freq: Once | INTRAMUSCULAR | Status: AC
  Administered 2018-02-09: 2 mg via INTRAVENOUS
  Filled 2018-02-09: qty 1

## 2018-02-09 NOTE — Progress Notes (Signed)
Pt removed IV for the second time tonight. Pt states he doesn't remember pulling out his IV either times. Pt is a difficult IV start. IV team consulted.

## 2018-02-09 NOTE — Progress Notes (Addendum)
[facility]  PROGRESS NOTE  [name] KZL:[id number] DOB: 26-Apr-1931 DOA: 02/04/2018 PCP: Clayborn Heron, MD   Brief HPI:    84 year old male with a history of CAD status post PCI in July 2019, chronic systolic heart failure, blindness, type 2 diabetes mellitus, dyslipidemia, obesity was brought to hospital after patient fell from couch.  Patient experienced significant right hip pain and was brought to the ED for further evaluation.  In the ED patient was found to be confused and was found to have abnormal UA started on IV ceftriaxone for UTI.  X-ray of the pelvis with right hip showed superior pubic ramus fracture   Subjective   Patient seen and examined, denies any pain.  Awaiting rehab placement.   Assessment/Plan:     1. Encephalopathy-resolved, likely from underlying UTI.  Patient is on IV antibiotics.  2. UTI-patient had urine culture done on 01/23/2018 which showed Klebsiella pneumoniae, Sensitive to ceftriaxone.  Patient had abnormal UA at the time of admission with positive nitrite.  Urine culture was not obtained, will obtain urine cultures, continue with IV ceftriaxone .  Repeat urine cultures in the hospital showed no growth.  3. Right hip pain-discussed with radiologist, x-ray of the hip showed superior pubic ramus fracture, patient does have significant osteopenia and recommended MRI of the right hip to rule out occult hip fracture.  MRI of the right hip has been ordered, was not done as there was a question of stimulator in back.  CT of the right hip showed no hip fracture.  4. Right superior pubic ramus fracture-we will get orthopedics consultation for further recommendations regarding weightbearing  5. CAD-stable, continue atorvastatin, ticagrelor  6. Diabetes mellitus type 2-diet controlled, continue sliding scale insulin with NovoLog.  7. Chronic kidney disease stage III- serum creatinine 1.20, at baseline.  8. Hypertension-blood pressure stable,  continue metoprolol 25 mg daily.     CBG: Recent Labs  Lab 02/08/18 0826 02/08/18 1303 02/08/18 1647 02/08/18 2210 02/09/18 0755  GLUCAP 82 106* 161* 133* 84    CBC: Recent Labs  Lab 02/04/18 1355 02/05/18 0403 02/07/18 0829  WBC 5.4 5.2 5.3  NEUTROABS 3.3  --   --   HGB 11.1* 11.1* 10.1*  HCT 35.7* 35.1* 31.6*  MCV 89.9 89.1 87.1  PLT 185 181 166    Basic Metabolic Panel: Recent Labs  Lab 02/04/18 1355 02/05/18 0403 02/07/18 0829  NA 139 141 138  K 3.8 4.0 3.5  CL 108 108 108  CO2 23 26 23   GLUCOSE 105* 89 97  BUN 14 12 13   CREATININE 1.31* 1.18 1.20  CALCIUM 9.0 8.7* 8.5*     DVT prophylaxis: Lovenox  Code Status: Full code  Family Communication: No family at bedside  Disposition Plan: SNF   Consultants:    Procedures:     Antibiotics:   Anti-infectives (From admission, onward)   Start     Dose/Rate Route Frequency Ordered Stop   02/04/18 2130  cefTRIAXone (ROCEPHIN) 1 g in sodium chloride 0.9 % 100 mL IVPB     1 g 200 mL/hr over 30 Minutes Intravenous Every 24 hours 02/04/18 2120     02/04/18 1800  cephALEXin (KEFLEX) capsule 500 mg     500 mg Oral  Once 02/04/18 1746 02/04/18 1815       Objective   Vitals:   02/08/18 0603 02/08/18 1418 02/08/18 2129 02/09/18 0500  BP: (!) 141/76 (!) 142/79 (!) 143/82 (!) 149/87  Pulse: 87 77 83  82  Resp: 18 14 18 18   Temp:  98 F (36.7 C) 98.3 F (36.8 C) 98.5 F (36.9 C)  TempSrc:   Oral Oral  SpO2: 98% 95% 98% 99%  Weight:      Height:        Intake/Output Summary (Last 24 hours) at 02/09/2018 1029 Last data filed at 02/09/2018 0959 Gross per 24 hour  Intake 619.69 ml  Output 650 ml  Net -30.31 ml   Filed Weights   02/04/18 1357  Weight: 95.7 kg     Physical Examination:   Eyes: No icterus, extraocular muscles intact  Mouth: Oral mucosa is moist, no lesions on palate,  Neck: Supple, no deformities, masses, or tenderness Lungs: Normal respiratory effort, bilateral clear  to auscultation, no crackles or wheezes.  Heart: Regular rate and rhythm, S1 and S2 normal, no murmurs, rubs auscultated Abdomen: BS normoactive,soft,nondistended,non-tender to palpation,no organomegaly Extremities: Mild tenderness at right hip joint. Neuro : Alert and oriented to time, place and person, No focal deficits Skin: No rashes seen on exam      Data Reviewed: I have personally reviewed following labs and imaging studies   Recent Results (from the past 240 hour(s))  Culture, Urine     Status: None   Collection Time: 02/05/18 10:48 PM  Result Value Ref Range Status   Specimen Description URINE, CLEAN CATCH  Final   Special Requests NONE  Final   Culture   Final    NO GROWTH Performed at [facility], [address], [place], [place] [address]    Report Status 02/07/2018 FINAL  Final     Liver Function Tests: No results for input(s): AST, ALT, ALKPHOS, BILITOT, PROT, ALBUMIN in the last 168 hours. No results for input(s): LIPASE, AMYLASE in the last 168 hours. No results for input(s): AMMONIA in the last 168 hours.  Cardiac Enzymes: No results for input(s): CKTOTAL, CKMB, CKMBINDEX, TROPONINI in the last 168 hours. BNP (last 3 results) Recent Labs    12/08/17 1122 01/23/18 1010  BNP 160.8* 67.8    ProBNP (last 3 results) No results for input(s): PROBNP in the last 8760 hours.    Studies: Dg Pelvis Comp Min 3v  Result Date: 02/08/2018 CLINICAL DATA:  84 year old male with a history of right superior pubic ramus fracture EXAM: JUDET PELVIS - 3+ VIEW COMPARISON:  CT 02/07/2018 FINDINGS: Judet series of the pelvis again demonstrates right superior pubic ramus fracture, with minimal displacement and relatively unchanged from the comparison CT scan. The plain film series also demonstrates a nondisplaced right inferior pubic ramus fracture, which, in retrospect was present on the comparison CT. Degenerative changes of the lumbar spine. Bilateral hips  demonstrate degenerative changes, with glenohumeral joints congruent. Osteopenia. IMPRESSION: Redemonstration of right superior pubic ramus fracture with minimal displacement, relatively unchanged from the comparison CT. The plain film confirms a nondisplaced right inferior pubic ramus fracture. Electronically Signed   By: Gilmer Mor D.O.   On: 02/08/2018 15:13   Ct Hip Right Wo Contrast  Result Date: 02/07/2018 CLINICAL DATA:  Right hip pain.  Right pubic ramus fractures. EXAM: CT OF THE RIGHT HIP WITHOUT CONTRAST TECHNIQUE: Multidetector CT imaging of the right hip was performed according to the standard protocol. Multiplanar CT image reconstructions were also generated. COMPARISON:  Radiographs dated 02/04/2018 FINDINGS: Bones/Joint/Cartilage There is a comminuted fracture of the right superior pubic ramus adjacent to the pubic body. There is no discrete inferior pubic ramus fracture. The other visualized pelvic  bones are intact. Proximal femur is intact. Slight diffuse joint space narrowing of the hips. Muscles and Tendons Normal. Soft tissues Soft tissue contusion in the subcutaneous fat lateral to the right hip. IMPRESSION: 1. Comminuted fracture of the right superior pubic ramus. 2. Soft tissue contusion lateral to the right hip. 3. Proximal right femur is intact. Electronically Signed   By: Francene Boyers M.D.   On: 02/07/2018 15:02    Scheduled Meds: . acetaminophen  650 mg Oral Q6H  . atorvastatin  80 mg Oral Daily  . enoxaparin (LOVENOX) injection  40 mg Subcutaneous Q24H  . insulin aspart  0-9 Units Subcutaneous TID WC  . latanoprost  1 drop Left Eye QHS  . metoprolol succinate  25 mg Oral Daily  . prednisoLONE acetate  1 drop Right Eye QHS  . sertraline  50 mg Oral QHS  . ticagrelor  90 mg Oral BID  . timolol  1 drop Right Eye Daily      Time spent: 25 min  Meredeth Ide   [facility] Pager [phone]. If 7PM-7AM, please contact night-coverage at  [email or web address], Office  [phone]  password [email or web address]  02/09/2018, 10:29 AM  LOS: 5 days

## 2018-02-10 DIAGNOSIS — N390 Urinary tract infection, site not specified: Secondary | ICD-10-CM | POA: Diagnosis not present

## 2018-02-10 DIAGNOSIS — S329XXA Fracture of unspecified parts of lumbosacral spine and pelvis, initial encounter for closed fracture: Secondary | ICD-10-CM | POA: Diagnosis present

## 2018-02-10 DIAGNOSIS — G9340 Encephalopathy, unspecified: Secondary | ICD-10-CM | POA: Diagnosis not present

## 2018-02-10 DIAGNOSIS — S32511D Fracture of superior rim of right pubis, subsequent encounter for fracture with routine healing: Secondary | ICD-10-CM | POA: Diagnosis not present

## 2018-02-10 DIAGNOSIS — G9341 Metabolic encephalopathy: Secondary | ICD-10-CM | POA: Diagnosis not present

## 2018-02-10 LAB — GLUCOSE, CAPILLARY
Glucose-Capillary: 106 mg/dL — ABNORMAL HIGH (ref 70–99)
Glucose-Capillary: 118 mg/dL — ABNORMAL HIGH (ref 70–99)
Glucose-Capillary: 131 mg/dL — ABNORMAL HIGH (ref 70–99)
Glucose-Capillary: 108 mg/dL — ABNORMAL HIGH (ref 70–99)

## 2018-02-10 MED ORDER — LORAZEPAM 2 MG/ML IJ SOLN
1.0000 mg | Freq: Once | INTRAMUSCULAR | Status: DC
  Filled 2018-02-10: qty 1

## 2018-02-10 NOTE — Progress Notes (Addendum)
Upon assessment at the beginning of this shift, patient had new onset confusion with hallucinations. Patient claims he is seeing bugs and snakes in his bed and room. Patient states he is currently at home, but then states that his son needs to pick him up from the hospital so he can go home. Patient is talking aloud when no one is in the room. Patient's CAM is positive for delirium. Blount,NP notified at 2130 and she returned page and placed a one time order for 2mg  of Haldol. No changes in patient's behavior after receiving the haldol. Blount,NP notified and she placed one time order for ativan. Ativan not given because patient became calmer and was not trying to get out of bed.  0300: Upon reassessment, patient was alert and oriented x4. He does remember seeing snakes and bugs, but states that he is no longer seeing them. Patient states that he does get confused at home while staying with his son, but never sees objects that are not there. Patient does not remember taking out his IV this morning. New IV placed by IV team. Will continue to monitor and treat per MD orders.

## 2018-02-10 NOTE — Progress Notes (Signed)
[facility]  PROGRESS NOTE  KAZUKI INGLE ZOX:[id number] DOB: [birth year] DOA: 02/04/2018 PCP: Clayborn Heron, MD   Brief HPI:    84 year old male with a history of CAD status post PCI in July 2019, chronic systolic heart failure, blindness, type 2 diabetes mellitus, dyslipidemia, obesity was brought to hospital after patient fell from couch.  Patient experienced significant right hip pain and was brought to the ED for further evaluation.  In the ED patient was found to be confused and was found to have abnormal UA started on IV ceftriaxone for UTI.  X-ray of the pelvis with right hip showed superior pubic ramus fracture   Subjective   Patient seen and examined, denies any complaints.  Events noted from last night.  He is not confused at this time.   Assessment/Plan:     1. Encephalopathy/questionable dementia-resolved, likely from underlying UTI.  Patient is on IV antibiotics.  May have underlying dementia with sundowning  2. UTI-patient had urine culture done on 01/23/2018 which showed Klebsiella pneumoniae, Sensitive to ceftriaxone.  Patient had abnormal UA at the time of admission with positive nitrite.  Urine culture was not obtained, will obtain urine cultures, continue with IV ceftriaxone .  Repeat urine cultures in the hospital showed no growth.  3. Right hip pain-discussed with radiologist, x-ray of the hip showed superior pubic ramus fracture, patient does have significant osteopenia and recommended MRI of the right hip to rule out occult hip fracture.  MRI of the right hip has been ordered, was not done as there was a question of stimulator in back.  CT of the right hip showed no hip fracture.  4. Right superior pubic ramus fracture-we will get orthopedics consultation for further recommendations regarding weightbearing  5. CAD-stable, continue atorvastatin, ticagrelor  6. Diabetes mellitus type 2-diet controlled, continue sliding scale insulin with  NovoLog.  7. Chronic kidney disease stage III- serum creatinine 1.20, at baseline.  8. Hypertension-blood pressure stable, continue metoprolol 25 mg daily.     CBG: Recent Labs  Lab 02/09/18 0755 02/09/18 1153 02/09/18 1602 02/09/18 2141 02/10/18 0914  GLUCAP 84 116* 154* 121* 106*    CBC: Recent Labs  Lab 02/04/18 1355 02/05/18 0403 02/07/18 0829  WBC 5.4 5.2 5.3  NEUTROABS 3.3  --   --   HGB 11.1* 11.1* 10.1*  HCT 35.7* 35.1* 31.6*  MCV 89.9 89.1 87.1  PLT 185 181 166    Basic Metabolic Panel: Recent Labs  Lab 02/04/18 1355 02/05/18 0403 02/07/18 0829  NA 139 141 138  K 3.8 4.0 3.5  CL 108 108 108  CO2 23 26 23   GLUCOSE 105* 89 97  BUN 14 12 13   CREATININE 1.31* 1.18 1.20  CALCIUM 9.0 8.7* 8.5*     DVT prophylaxis: Lovenox  Code Status: Full code  Family Communication: No family at bedside  Disposition Plan: SNF   Consultants:    Procedures:     Antibiotics:   Anti-infectives (From admission, onward)   Start     Dose/Rate Route Frequency Ordered Stop   02/04/18 2130  cefTRIAXone (ROCEPHIN) 1 g in sodium chloride 0.9 % 100 mL IVPB     1 g 200 mL/hr over 30 Minutes Intravenous Every 24 hours 02/04/18 2120     02/04/18 1800  cephALEXin (KEFLEX) capsule 500 mg     500 mg Oral  Once 02/04/18 1746 02/04/18 1815       Objective   Vitals:   02/09/18 0500 02/09/18 1332 02/09/18  2141 02/10/18 0508  BP: (!) 149/87 (!) 166/99 (!) 140/93 (!) 152/116  Pulse: 82 85 92 95  Resp: 18 18 18 18   Temp: 98.5 F (36.9 C) 98.7 F (37.1 C) 98.3 F (36.8 C) 98.6 F (37 C)  TempSrc: Oral Oral Oral Oral  SpO2: 99% 99% 96% 97%  Weight:      Height:        Intake/Output Summary (Last 24 hours) at 02/10/2018 1040 Last data filed at 02/10/2018 0092 Gross per 24 hour  Intake 200 ml  Output 1825 ml  Net -1625 ml   Filed Weights   02/04/18 1357  Weight: 95.7 kg     Physical Examination:   Eyes: No icterus, extraocular muscles intact   Mouth: Oral mucosa is moist, no lesions on palate,  Neck: Supple, no deformities, masses, or tenderness Lungs: Normal respiratory effort, bilateral clear to auscultation, no crackles or wheezes.  Heart: Regular rate and rhythm, S1 and S2 normal, no murmurs, rubs auscultated Abdomen: BS normoactive,soft,nondistended,non-tender to palpation,no organomegaly Extremities: Tenderness to palpation at right hip joint Neuro : Alert and oriented to time, place and person, No focal deficits Skin: No rashes seen on exam     Data Reviewed: I have personally reviewed following labs and imaging studies   Recent Results (from the past 240 hour(s))  Culture, Urine     Status: None   Collection Time: 02/05/18 10:48 PM  Result Value Ref Range Status   Specimen Description URINE, CLEAN CATCH  Final   Special Requests NONE  Final   Culture   Final    NO GROWTH Performed at [facility], [address] [place], [place] [address]    Report Status 02/07/2018 FINAL  Final     Liver Function Tests: No results for input(s): AST, ALT, ALKPHOS, BILITOT, PROT, ALBUMIN in the last 168 hours. No results for input(s): LIPASE, AMYLASE in the last 168 hours. No results for input(s): AMMONIA in the last 168 hours.  Cardiac Enzymes: No results for input(s): CKTOTAL, CKMB, CKMBINDEX, TROPONINI in the last 168 hours. BNP (last 3 results) Recent Labs    12/08/17 1122 01/23/18 1010  BNP 160.8* 67.8    ProBNP (last 3 results) No results for input(s): PROBNP in the last 8760 hours.    Studies: Dg Pelvis Comp Min 3v  Result Date: 02/08/2018 CLINICAL DATA:  84 year old male with a history of right superior pubic ramus fracture EXAM: JUDET PELVIS - 3+ VIEW COMPARISON:  CT 02/07/2018 FINDINGS: Judet series of the pelvis again demonstrates right superior pubic ramus fracture, with minimal displacement and relatively unchanged from the comparison CT scan. The plain film series also demonstrates a  nondisplaced right inferior pubic ramus fracture, which, in retrospect was present on the comparison CT. Degenerative changes of the lumbar spine. Bilateral hips demonstrate degenerative changes, with glenohumeral joints congruent. Osteopenia. IMPRESSION: Redemonstration of right superior pubic ramus fracture with minimal displacement, relatively unchanged from the comparison CT. The plain film confirms a nondisplaced right inferior pubic ramus fracture. Electronically Signed   By: Gilmer Mor D.O.   On: 02/08/2018 15:13    Scheduled Meds: . acetaminophen  650 mg Oral Q6H  . atorvastatin  80 mg Oral Daily  . enoxaparin (LOVENOX) injection  40 mg Subcutaneous Q24H  . insulin aspart  0-9 Units Subcutaneous TID WC  . latanoprost  1 drop Left Eye QHS  . LORazepam  1 mg Intravenous Once  . metoprolol succinate  25 mg Oral Daily  .  prednisoLONE acetate  1 drop Right Eye QHS  . sertraline  50 mg Oral QHS  . ticagrelor  90 mg Oral BID  . timolol  1 drop Right Eye Daily      Time spent: 25 min  Meredeth Ide   [facility] Pager [phone]. If 7PM-7AM, please contact night-coverage at [email or web address], Office  [phone]  password [email or web address]  02/10/2018, 10:40 AM  LOS: 6 days

## 2018-02-10 NOTE — Care Management Obs Status (Signed)
MEDICARE OBSERVATION STATUS NOTIFICATION   Patient Details  Name: [name] MRN: [id number] Date of Birth: May 26, 1931   Medicare Observation Status Notification Given:  Yes(Pt unable to sign 2/2 legally blind, however,  NCM explained Letter to pt @ bedside and  Jorenda(daughter) via phone)    Epifanio Lesches, RN 02/10/2018, 4:24 PM

## 2018-02-10 NOTE — Progress Notes (Signed)
Physical Therapy Treatment Patient Details Name: [name] MRN: [id number] DOB: 07-19-1930 Today's Date: 02/10/2018    History of Present Illness [name] is a 84 y.o. male diet controlled DM2, macular degeneration,  legal blindness , HLD, remote lacunar infracts , h/o CAD s/p MI with DES to OM2 and Lcx with staged DESx2 to the RCA in 12/2017, chronic systolic HF (45%)  , He was brought to the hospital folling a fall when standing from his chair. He was found to have a suerior rami pelvic fx. Per MD he is WBAT.     PT Comments    Continuing work on functional mobility and activity tolerance;  Notably improved ability to take steps using RW to [place] painful RLE in stance; good participation and motivation; continue to recommend SNF for post-acute rehab to maximize independence and safety with mobility prior to dc home; I anticipate good progress  Follow Up Recommendations  SNF     Equipment Recommendations  Rolling walker with 5" wheels;3in1 (PT)(may already have)    Recommendations for Other Services       Precautions / Restrictions Precautions Precautions: Fall Precaution Comments: legally blind Restrictions Weight Bearing Restrictions: No    Mobility  Bed Mobility Overal bed mobility: Needs Assistance Bed Mobility: Supine to Sit     Supine to sit: Mod assist     General bed mobility comments: required mod a to scoot hips to the edge of the bed. Mod a to sit up. Once sitting supervision.   Transfers Overall transfer level: Needs assistance Equipment used: Rolling walker (2 wheeled) Transfers: Sit to/from Stand Sit to Stand: Mod assist         General transfer comment: Cues for hand position, safety and precautions; mod assist to power up and to steady while pt moved hands to RW  Ambulation/Gait Ambulation/Gait assistance: Min assist;+2 safety/equipment(chair follow) Gait Distance (Feet): 25 Feet Assistive device: Rolling walker (2 wheeled) Gait  Pattern/deviations: Decreased stance time - right;Antalgic     General Gait Details: Cues for pathfinding due to visual impariment; step-by-step cues to sequence, and also cues to support body weight on RW in R stance to decr pain and allow for L LE advancement   Stairs             Wheelchair Mobility    Modified Rankin (Stroke Patients Only)       Balance     Sitting balance-Leahy Scale: Good       Standing balance-Leahy Scale: Poor                              Cognition Arousal/Alertness: Awake/alert Behavior During Therapy: WFL for tasks assessed/performed Overall Cognitive Status: Within Functional Limits for tasks assessed                                        Exercises      General Comments        Pertinent Vitals/Pain Pain Assessment: 0-10 Pain Score: 7  Pain Location: Right hip Pain Descriptors / Indicators: Aching Pain Intervention(s): Monitored during session;Repositioned    Home Living                      Prior Function            PT Goals (current goals can now  be found in the care plan section) Acute Rehab PT Goals Patient Stated Goal: to go home PT Goal Formulation: With patient Time For Goal Achievement: 01/31/18 Potential to Achieve Goals: Good Progress towards PT goals: Progressing toward goals    Frequency    Min 4X/week      PT Plan Current plan remains appropriate    Co-evaluation              AM-PAC PT "6 Clicks" Daily Activity  Outcome Measure  Difficulty turning over in bed (including adjusting bedclothes, sheets and blankets)?: Unable Difficulty moving from lying on back to sitting on the side of the bed? : Unable Difficulty sitting down on and standing up from a chair with arms (e.g., wheelchair, bedside commode, etc,.)?: Unable Help needed moving to and from a bed to chair (including a wheelchair)?: A Lot Help needed walking in hospital room?: A Little Help  needed climbing 3-5 steps with a railing? : A Lot 6 Click Score: 10    End of Session Equipment Utilized During Treatment: Gait belt Activity Tolerance: Patient tolerated treatment well Patient left: with call bell/phone within reach;in chair Nurse Communication: Mobility status PT Visit Diagnosis: Other abnormalities of gait and mobility (R26.89);Muscle weakness (generalized) (M62.81)     Time: 7017-7939 PT Time Calculation (min) (ACUTE ONLY): 16 min  Charges:  $Gait Training: 8-22 mins                     Van Clines, [place]  Acute Rehabilitation Services Pager [phone] Office [phone]    Levi Aland 02/10/2018, 12:17 PM

## 2018-02-10 NOTE — Clinical Social Work Note (Signed)
Clinical Social Work Assessment  Patient Details  Name: [name] MRN: [id number] Date of Birth: [birth year]  Date of referral:  02/10/18               Reason for consult:  Facility Placement                Permission sought to share information with:  Facility Medical sales representative, Family Supports Permission granted to share information::  No  Name::     Arts administrator::  SNFs  Relationship::  Daughter  Contact Information:  [phone]  Housing/Transportation Living arrangements for the past 2 months:  Single Family Home Source of Information:  Adult Children Patient Interpreter Needed:  None Criminal Activity/Legal Involvement Pertinent to Current Situation/Hospitalization:  No - Comment as needed Significant Relationships:  Adult Children Lives with:  Adult Children Do you feel safe going back to the place where you live?  No Need for family participation in patient care:  Yes (Comment)  Care giving concerns:  CSW received consult for possible SNF placement at time of discharge. CSW spoke with patient's daughter regarding PT recommendation of SNF placement at time of discharge. Patient's daughter reported that patient lives with patient's son but he is unable to handle his needs at this time. Patient's daughter expressed understanding of PT recommendation and is agreeable to SNF placement at time of discharge. CSW to continue to follow and assist with discharge planning needs.   Social Worker assessment / plan:  CSW spoke with patient's daughter concerning possibility of rehab at [facility] before returning home.  Employment status:  Retired Database administrator PT Recommendations:  Skilled Nursing Facility Information / Referral to community resources:  Skilled Nursing Facility  Patient/Family's Response to care:  Patient's daughter recognizes need for rehab before returning home and is agreeable to a SNF in [place]. Patient's daughter reported  preference for [facility]. They will begin insurance authorization process.   Patient/Family's Understanding of and Emotional Response to Diagnosis, Current Treatment, and Prognosis:  Patient/family is realistic regarding therapy needs and expressed being hopeful for SNF placement. Patient's daughter expressed understanding of CSW role and discharge process as well as medical condition. No questions/concerns about plan or treatment.    Emotional Assessment Appearance:  Appears stated age Attitude/Demeanor/Rapport:  Unable to Assess Affect (typically observed):  Unable to Assess Orientation:  Oriented to Self, Oriented to Situation, Oriented to Place Alcohol / Substance use:  Not Applicable Psych involvement (Current and /or in the community):  No (Comment)  Discharge Needs  Concerns to be addressed:  Care Coordination Readmission within the last 30 days:  Yes Current discharge risk:  None Barriers to Discharge:  Continued Medical Work up   [employer], LCSWA 02/10/2018, 4:37 PM

## 2018-02-10 NOTE — [place] FL2 (Addendum)
[place] MEDICAID FL2 LEVEL OF CARE SCREENING TOOL     IDENTIFICATION  Patient Name: [name] Birthdate: [birth year] Sex: male Admission Date (Current Location): 02/04/2018  [facility] and [place] Number:  Producer, television/film/video and Address:  [facility] [place], [address] [place], [place] [address]      Provider Number: [id number]  Attending Physician Name and Address:  Meredeth Ide, MD  Relative Name and Phone Number:  Maree Krabbe [phone]    Current Level of Care: Hospital Recommended Level of Care: Skilled Nursing Facility Prior Approval Number:    Date Approved/Denied:   PASRR Number: [id number]  Discharge Plan: SNF    Current Diagnoses: Patient Active Problem List   Diagnosis Date Noted  . Pelvic fracture (HCC) 02/10/2018  . Recurrent syncope 01/23/2018  . Diarrhea   . Chest pain 12/08/2017  . Ischemic cardiomyopathy 11/28/2017  . Neurocognitive deficits 11/19/2017  . Bilious vomiting   . Ileus, unspecified (HCC) 11/13/2017  . Sleep apnea 11/13/2017  . Obesity 11/13/2017  . Stage III chronic kidney disease (HCC) 11/11/2017  . Acute on chronic combined systolic and diastolic CHF (congestive heart failure) (HCC) 11/11/2017  . CAD (coronary artery disease) 11/11/2017  . Acute ST elevation myocardial infarction (STEMI) of inferolateral wall (HCC) 11/10/2017  . ST elevation myocardial infarction (STEMI) (HCC)   . AKI (acute kidney injury) (HCC) 01/22/2015  . Lactic acidosis 01/22/2015  . Syncope and collapse   . HTN (hypertension), benign   . Encephalopathy 09/07/2014  . Osteoarthrosis, unspecified whether generalized or localized, shoulder region 09/21/2013  . Gout 12/23/2012  . Hypotension 12/21/2012  . Type 2 diabetes mellitus with complication, without long-term current use of insulin (HCC) 12/21/2012  . Hyperlipidemia   . Morbid obesity (HCC)     Orientation RESPIRATION BLADDER Height & Weight     Self, Situation, Place  Normal Incontinent Weight: 211 lb (95.7 kg) Height:  5\' 10"  (177.8 cm)  BEHAVIORAL SYMPTOMS/MOOD NEUROLOGICAL BOWEL NUTRITION STATUS      Incontinent Diet(Please see discharge summary)  AMBULATORY STATUS COMMUNICATION OF NEEDS Skin   Limited Assist Verbally Normal                       Personal Care Assistance Level of Assistance  Bathing, Feeding, Dressing, Total care Bathing Assistance: Limited assistance Feeding assistance: Limited assistance Dressing Assistance: Limited assistance Total Care Assistance: Limited assistance   Functional Limitations Info  Sight, Hearing, Speech Sight Info: Impaired Hearing Info: Adequate Speech Info: Adequate    SPECIAL CARE FACTORS FREQUENCY  OT (By licensed OT), PT (By licensed PT)     PT Frequency: 2x weekly OT Frequency: 2x weekly            Contractures Contractures Info: Not present    Additional Factors Info  Allergies   Allergies Info: No Known Allergies           Current Medications (02/10/2018):  This is the current hospital active medication list Current Facility-Administered Medications  Medication Dose Route Frequency Provider Last Rate Last Dose  . 0.9 %  sodium chloride infusion   Intravenous Continuous Meredeth Ide, MD 10 mL/hr at 02/09/18 1136    . acetaminophen (TYLENOL) tablet 650 mg  650 mg Oral Q6H Meredeth Ide, MD   650 mg at 02/10/18 1059  . atorvastatin (LIPITOR) tablet 80 mg  80 mg Oral Daily Arrien, York Ram, MD   80 mg at 02/10/18 1058  .  cefTRIAXone (ROCEPHIN) 1 g in sodium chloride 0.9 % 100 mL IVPB  1 g Intravenous Q24H Coralie Keens, MD 200 mL/hr at 02/09/18 2217 1 g at 02/09/18 2217  . enoxaparin (LOVENOX) injection 40 mg  40 mg Subcutaneous Q24H Coralie Keens, MD   40 mg at 02/08/18 2139  . insulin aspart (novoLOG) injection 0-9 Units  0-9 Units Subcutaneous TID WC Arrien, York Ram, MD   2 Units at 02/09/18 1703  . latanoprost (XALATAN) 0.005 % ophthalmic  solution 1 drop  1 drop Left Eye QHS Arrien, York Ram, MD   1 drop at 02/09/18 2218  . LORazepam (ATIVAN) injection 1 mg  1 mg Intravenous Once Blount, Andi Devon T, NP      . metoprolol succinate (TOPROL-XL) 24 hr tablet 25 mg  25 mg Oral Daily Arrien, York Ram, MD   25 mg at 02/10/18 1059  . morphine 2 MG/ML injection 1 mg  1 mg Intravenous Q4H PRN Arrien, York Ram, MD      . MUSCLE RUB CREA   Topical PRN Arrien, York Ram, MD      . nitroGLYCERIN (NITROSTAT) SL tablet 0.4 mg  0.4 mg Sublingual Q5 Min x 3 PRN Arrien, York Ram, MD      . ondansetron [facility]) tablet 4 mg  4 mg Oral Q6H PRN Arrien, York Ram, MD       Or  . ondansetron [facility]) injection 4 mg  4 mg Intravenous Q6H PRN Arrien, York Ram, MD      . oxyCODONE (Oxy IR/ROXICODONE) immediate release tablet 5 mg  5 mg Oral Q6H PRN Meredeth Ide, MD   5 mg at 02/08/18 0831  . prednisoLONE acetate (PRED FORTE) 1 % ophthalmic suspension 1 drop  1 drop Right Eye QHS Arrien, York Ram, MD   1 drop at 02/09/18 2217  . sertraline (ZOLOFT) tablet 50 mg  50 mg Oral QHS Arrien, York Ram, MD   50 mg at 02/09/18 2220  . ticagrelor (BRILINTA) tablet 90 mg  90 mg Oral BID Coralie Keens, MD   90 mg at 02/10/18 1059  . timolol (TIMOPTIC) 0.5 % ophthalmic solution 1 drop  1 drop Right Eye Daily Arrien, York Ram, MD   1 drop at 02/10/18 1059     Discharge Medications: Please see discharge summary for a list of discharge medications.  Relevant Imaging Results:  Relevant Lab Results:   Additional Information SSN [id number]  Gildardo Griffes, LCSW

## 2018-02-10 NOTE — Care Management CC44 (Signed)
Condition Code 44 Documentation Completed  Patient Details  Name: [name] MRN: [id number] Date of Birth: January 25, 1931   Condition Code 44 given:  Yes Patient signature on Condition Code 44 notice:  (Pt unable to sign 2/2 legally blind, however,  NCM explained Letter to pt @ bedside and  Jorenda(daughter) via phone) Documentation of 2 MD's agreement:  Yes Code 44 added to claim:  Yes    Epifanio Lesches, RN 02/10/2018, 4:24 PM

## 2018-02-11 DIAGNOSIS — I10 Essential (primary) hypertension: Secondary | ICD-10-CM | POA: Diagnosis not present

## 2018-02-11 DIAGNOSIS — N390 Urinary tract infection, site not specified: Secondary | ICD-10-CM | POA: Diagnosis not present

## 2018-02-11 DIAGNOSIS — S32511D Fracture of superior rim of right pubis, subsequent encounter for fracture with routine healing: Secondary | ICD-10-CM | POA: Diagnosis not present

## 2018-02-11 DIAGNOSIS — G9341 Metabolic encephalopathy: Secondary | ICD-10-CM | POA: Diagnosis not present

## 2018-02-11 LAB — CREATININE, SERUM
Creatinine, Ser: 1.1 mg/dL (ref 0.61–1.24)
GFR calc Af Amer: >60 mL/min (ref >60)
GFR calc non Af Amer: 60 mL/min — ABNORMAL LOW (ref >60)

## 2018-02-11 LAB — GLUCOSE, CAPILLARY
Glucose-Capillary: 82 mg/dL (ref 70–99)
Glucose-Capillary: 118 mg/dL — ABNORMAL HIGH (ref 70–99)
Glucose-Capillary: 131 mg/dL — ABNORMAL HIGH (ref 70–99)
Glucose-Capillary: 123 mg/dL — ABNORMAL HIGH (ref 70–99)

## 2018-02-11 MED ORDER — SODIUM CHLORIDE 0.9 % IV SOLN
1.0000 g | INTRAVENOUS | Status: AC
  Administered 2018-02-11: 1 g via INTRAVENOUS
  Filled 2018-02-11: qty 10

## 2018-02-11 MED ORDER — AMLODIPINE BESYLATE 5 MG PO TABS
5.0000 mg | ORAL_TABLET | Freq: Every day | ORAL | Status: DC
  Administered 2018-02-11 – 2018-02-12 (×2): 5 mg via ORAL
  Filled 2018-02-11 (×2): qty 1

## 2018-02-11 NOTE — Plan of Care (Signed)
POC reviewed with patient. No acute changes throughout shift.

## 2018-02-11 NOTE — Progress Notes (Signed)
[facility]  PROGRESS NOTE  [name] NWG:[id number] DOB: 1931-05-01 DOA: 02/04/2018 PCP: Clayborn Heron, MD   Brief HPI:    84 year old male with a history of CAD status post PCI in July 2019, chronic systolic heart failure, blindness, type 2 diabetes mellitus, dyslipidemia, obesity was brought to hospital after patient fell from couch.  Patient experienced significant right hip pain and was brought to the ED for further evaluation.  In the ED patient was found to be confused and was found to have abnormal UA started on IV ceftriaxone for UTI.  X-ray of the pelvis with right hip showed superior pubic ramus fracture   Subjective   Patient seen and examined, denies any new complaints.  Complains of pain in right thigh.   Assessment/Plan:     1. Encephalopathy/questionable dementia-resolved, likely from underlying UTI.  Patient is on IV antibiotics.  May have underlying dementia with sundowning  2. UTI-patient had urine culture done on 01/23/2018 which showed Klebsiella pneumoniae, Sensitive to ceftriaxone.  Patient had abnormal UA at the time of admission with positive nitrite.  Urine culture was not obtained, will obtain urine cultures, patient was started on IV ceftriaxone on 02/04/2018.  Will discontinue ceftriaxone today as he has completed 7 days of treatment.    Repeat urine cultures in the hospital showed no growth.  3. Right hip pain-discussed with radiologist, x-ray of the hip showed superior pubic ramus fracture, patient does have significant osteopenia and recommended MRI of the right hip to rule out occult hip fracture.  MRI of the right hip has been ordered, was not done as there was a question of stimulator in back.  CT of the right hip showed no hip fracture.  4. Right superior pubic ramus fracture-orthopedics was consulted, recommends nonoperative management patient can be weightbearing as tolerated to right lower extremity, follow-up orthopedics Dr. Jena Gauss in 1  month   5. CAD-stable, continue atorvastatin, ticagrelor  6. Diabetes mellitus type 2-diet controlled, continue sliding scale insulin with NovoLog.  7. Chronic kidney disease stage III- serum creatinine 1.20, at baseline.  8. Hypertension-blood pressure mildly elevated, patient is currently on metoprolol succinate 25 mg daily, will add amlodipine 5 mg p.o. daily     CBG: Recent Labs  Lab 02/10/18 1231 02/10/18 1709 02/10/18 2215 02/11/18 0817 02/11/18 1153  GLUCAP 118* 131* 108* 82 118*    CBC: Recent Labs  Lab 02/05/18 0403 02/07/18 0829  WBC 5.2 5.3  HGB 11.1* 10.1*  HCT 35.1* 31.6*  MCV 89.1 87.1  PLT 181 166    Basic Metabolic Panel: Recent Labs  Lab 02/05/18 0403 02/07/18 0829 02/11/18 0526  NA 141 138  --   K 4.0 3.5  --   CL 108 108  --   CO2 26 23  --   GLUCOSE 89 97  --   BUN 12 13  --   CREATININE 1.18 1.20 1.10  CALCIUM 8.7* 8.5*  --      DVT prophylaxis: Lovenox  Code Status: Full code  Family Communication: No family at bedside  Disposition Plan: SNF   Consultants:    Procedures:     Antibiotics:   Anti-infectives (From admission, onward)   Start     Dose/Rate Route Frequency Ordered Stop   02/04/18 2130  cefTRIAXone (ROCEPHIN) 1 g in sodium chloride 0.9 % 100 mL IVPB     1 g 200 mL/hr over 30 Minutes Intravenous Every 24 hours 02/04/18 2120     02/04/18 1800  cephALEXin (KEFLEX) capsule 500 mg     500 mg Oral  Once 02/04/18 1746 02/04/18 1815       Objective   Vitals:   02/10/18 0508 02/10/18 1444 02/10/18 2226 02/11/18 0503  BP: (!) 152/116 (!) 162/104 (!) 149/99 (!) 161/88  Pulse: 95 96 78 68  Resp: 18 15 17 17   Temp: 98.6 F (37 C) 98 F (36.7 C) 97.8 F (36.6 C) 98.1 F (36.7 C)  TempSrc: Oral Oral Oral   SpO2: 97% 91% 98% 100%  Weight:      Height:        Intake/Output Summary (Last 24 hours) at 02/11/2018 1506 Last data filed at 02/11/2018 1300 Gross per 24 hour  Intake 220.16 ml  Output 500 ml   Net -279.84 ml   Filed Weights   02/04/18 1357  Weight: 95.7 kg     Physical Examination:   Eyes: No icterus, extraocular muscles intact  Mouth: Oral mucosa is moist, no lesions on palate,  Neck: Supple, no deformities, masses, or tenderness Lungs: Normal respiratory effort, bilateral clear to auscultation, no crackles or wheezes.  Heart: Regular rate and rhythm, S1 and S2 normal, no murmurs, rubs auscultated Abdomen: BS normoactive,soft,nondistended,non-tender to palpation,no organomegaly Extremities: Tenderness of the right upper thigh. Neuro : Alert and oriented to time, place and person, No focal deficits Skin: No rashes seen on exam    Data Reviewed: I have personally reviewed following labs and imaging studies   Recent Results (from the past 240 hour(s))  Culture, Urine     Status: None   Collection Time: 02/05/18 10:48 PM  Result Value Ref Range Status   Specimen Description URINE, CLEAN CATCH  Final   Special Requests NONE  Final   Culture   Final    NO GROWTH Performed at [facility], [address] [place], [place] [address]    Report Status 02/07/2018 FINAL  Final     Liver Function Tests: No results for input(s): AST, ALT, ALKPHOS, BILITOT, PROT, ALBUMIN in the last 168 hours. No results for input(s): LIPASE, AMYLASE in the last 168 hours. No results for input(s): AMMONIA in the last 168 hours.  Cardiac Enzymes: No results for input(s): CKTOTAL, CKMB, CKMBINDEX, TROPONINI in the last 168 hours. BNP (last 3 results) Recent Labs    12/08/17 1122 01/23/18 1010  BNP 160.8* 67.8    ProBNP (last 3 results) No results for input(s): PROBNP in the last 8760 hours.    Studies: No results found.  Scheduled Meds: . acetaminophen  650 mg Oral Q6H  . amLODipine  5 mg Oral Daily  . atorvastatin  80 mg Oral Daily  . enoxaparin (LOVENOX) injection  40 mg Subcutaneous Q24H  . insulin aspart  0-9 Units Subcutaneous TID WC  . latanoprost  1 drop  Left Eye QHS  . LORazepam  1 mg Intravenous Once  . metoprolol succinate  25 mg Oral Daily  . prednisoLONE acetate  1 drop Right Eye QHS  . sertraline  50 mg Oral QHS  . ticagrelor  90 mg Oral BID  . timolol  1 drop Right Eye Daily      Time spent: 25 min  Meredeth Ide   [facility] Pager [phone]. If 7PM-7AM, please contact night-coverage at [email or web address], Office  [phone]  password [email or web address]  02/11/2018, 3:06 PM  LOS: 6 days

## 2018-02-11 NOTE — Progress Notes (Signed)
Physical Therapy Treatment Patient Details Name: [name] MRN: [id number] DOB: March 13, 1931 Today's Date: 02/11/2018    History of Present Illness [name] is a 84 y.o. male diet controlled DM2, macular degeneration,  legal blindness , HLD, remote lacunar infracts , h/o CAD s/p MI with DES to OM2 and Lcx with staged DESx2 to the RCA in 12/2017, chronic systolic HF (45%)  , He was brought to the hospital folling a fall when standing from his chair. He was found to have a suerior rami pelvic fx. Per MD he is WBAT.     PT Comments    Continuing work on functional mobility and activity tolerance;  Mr. Lannin reports more fatigu/malaise this morning, but he is willing to get up and walk; Good use of RW to unweigh painful RLE in stance; continue to recommend SNF for post-acute rehab  Follow Up Recommendations  SNF     Equipment Recommendations  Rolling walker with 5" wheels;3in1 (PT)(may already have)    Recommendations for Other Services       Precautions / Restrictions Precautions Precautions: Fall Precaution Comments: legally blind    Mobility  Bed Mobility Overal bed mobility: Needs Assistance Bed Mobility: Supine to Sit     Supine to sit: Mod assist     General bed mobility comments: required mod a to scoot hips to the edge of the bed. Mod a to sit up. Once sitting supervision.   Transfers Overall transfer level: Needs assistance Equipment used: Rolling walker (2 wheeled) Transfers: Sit to/from Stand Sit to Stand: Mod assist         General transfer comment: Cues for hand position, safety and precautions; mod assist to power up and to steady while pt moved hands to RW  Ambulation/Gait Ambulation/Gait assistance: Min assist;+2 safety/equipment(chair follow) Gait Distance (Feet): 20 Feet Assistive device: Rolling walker (2 wheeled) Gait Pattern/deviations: Decreased stance time - right;Antalgic     General Gait Details: Cues for pathfinding due to  visual impariment; step-by-step cues to sequence, and also cues to support body weight on RW in R stance to decr pain and allow for L LE advancement   Stairs             Wheelchair Mobility    Modified Rankin (Stroke Patients Only)       Balance     Sitting balance-Leahy Scale: Good       Standing balance-Leahy Scale: Poor                              Cognition Arousal/Alertness: Awake/alert Behavior During Therapy: WFL for tasks assessed/performed Overall Cognitive Status: Within Functional Limits for tasks assessed                                        Exercises General Exercises - Lower Extremity Ankle Circles/Pumps: AROM;Both;20 reps Quad Sets: AROM;Both;10 reps    General Comments        Pertinent Vitals/Pain Pain Assessment: Faces Faces Pain Scale: Hurts even more Pain Location: Right hip, and generalized fatigue/malaise Pain Descriptors / Indicators: Aching Pain Intervention(s): Monitored during session    Home Living                      Prior Function            PT Goals (  current goals can now be found in the care plan section) Acute Rehab PT Goals Patient Stated Goal: to go home PT Goal Formulation: With patient Time For Goal Achievement: 02/22/18 Potential to Achieve Goals: Good Progress towards PT goals: Progressing toward goals    Frequency    Min 4X/week      PT Plan Current plan remains appropriate    Co-evaluation              AM-PAC PT "6 Clicks" Daily Activity  Outcome Measure  Difficulty turning over in bed (including adjusting bedclothes, sheets and blankets)?: Unable Difficulty moving from lying on back to sitting on the side of the bed? : A Lot Difficulty sitting down on and standing up from a chair with arms (e.g., wheelchair, bedside commode, etc,.)?: Unable Help needed moving to and from a bed to chair (including a wheelchair)?: A Lot Help needed walking in hospital  room?: A Little Help needed climbing 3-5 steps with a railing? : A Lot 6 Click Score: 11    End of Session Equipment Utilized During Treatment: Gait belt Activity Tolerance: Patient tolerated treatment well Patient left: in chair;with call bell/phone within reach;with nursing/sitter in room;Other (comment)(Nurse tech assisting with breakfast) Nurse Communication: Mobility status PT Visit Diagnosis: Other abnormalities of gait and mobility (R26.89);Muscle weakness (generalized) (M62.81)     Time: 0940-1001 PT Time Calculation (min) (ACUTE ONLY): 21 min  Charges:  $Gait Training: 8-22 mins                     Van Clines, [place]  [facility] Pager [phone] Office [phone]'   Levi Aland 02/11/2018, 12:59 PM

## 2018-02-12 DIAGNOSIS — N3000 Acute cystitis without hematuria: Secondary | ICD-10-CM

## 2018-02-12 DIAGNOSIS — N183 Chronic kidney disease, stage 3 (moderate): Secondary | ICD-10-CM | POA: Diagnosis not present

## 2018-02-12 DIAGNOSIS — S32511D Fracture of superior rim of right pubis, subsequent encounter for fracture with routine healing: Secondary | ICD-10-CM | POA: Diagnosis not present

## 2018-02-12 DIAGNOSIS — H409 Unspecified glaucoma: Secondary | ICD-10-CM | POA: Diagnosis not present

## 2018-02-12 DIAGNOSIS — S32519S Fracture of superior rim of unspecified pubis, sequela: Secondary | ICD-10-CM | POA: Diagnosis not present

## 2018-02-12 DIAGNOSIS — M6281 Muscle weakness (generalized): Secondary | ICD-10-CM | POA: Diagnosis not present

## 2018-02-12 DIAGNOSIS — H3530 Unspecified macular degeneration: Secondary | ICD-10-CM | POA: Diagnosis not present

## 2018-02-12 DIAGNOSIS — W08XXXA Fall from other furniture, initial encounter: Secondary | ICD-10-CM | POA: Diagnosis present

## 2018-02-12 DIAGNOSIS — N390 Urinary tract infection, site not specified: Secondary | ICD-10-CM | POA: Diagnosis not present

## 2018-02-12 DIAGNOSIS — K279 Peptic ulcer, site unspecified, unspecified as acute or chronic, without hemorrhage or perforation: Secondary | ICD-10-CM | POA: Diagnosis not present

## 2018-02-12 DIAGNOSIS — G4700 Insomnia, unspecified: Secondary | ICD-10-CM | POA: Diagnosis not present

## 2018-02-12 DIAGNOSIS — I10 Essential (primary) hypertension: Secondary | ICD-10-CM | POA: Diagnosis not present

## 2018-02-12 DIAGNOSIS — G9341 Metabolic encephalopathy: Secondary | ICD-10-CM | POA: Diagnosis present

## 2018-02-12 DIAGNOSIS — E1140 Type 2 diabetes mellitus with diabetic neuropathy, unspecified: Secondary | ICD-10-CM | POA: Diagnosis not present

## 2018-02-12 DIAGNOSIS — H548 Legal blindness, as defined in USA: Secondary | ICD-10-CM | POA: Diagnosis not present

## 2018-02-12 DIAGNOSIS — E785 Hyperlipidemia, unspecified: Secondary | ICD-10-CM | POA: Diagnosis not present

## 2018-02-12 DIAGNOSIS — I2510 Atherosclerotic heart disease of native coronary artery without angina pectoris: Secondary | ICD-10-CM | POA: Diagnosis not present

## 2018-02-12 DIAGNOSIS — M129 Arthropathy, unspecified: Secondary | ICD-10-CM | POA: Diagnosis not present

## 2018-02-12 DIAGNOSIS — G9340 Encephalopathy, unspecified: Secondary | ICD-10-CM | POA: Diagnosis not present

## 2018-02-12 DIAGNOSIS — E119 Type 2 diabetes mellitus without complications: Secondary | ICD-10-CM | POA: Diagnosis not present

## 2018-02-12 DIAGNOSIS — Z23 Encounter for immunization: Secondary | ICD-10-CM | POA: Diagnosis not present

## 2018-02-12 DIAGNOSIS — Y92008 Other place in unspecified non-institutional (private) residence as the place of occurrence of the external cause: Secondary | ICD-10-CM | POA: Diagnosis not present

## 2018-02-12 DIAGNOSIS — I5040 Unspecified combined systolic (congestive) and diastolic (congestive) heart failure: Secondary | ICD-10-CM | POA: Diagnosis not present

## 2018-02-12 DIAGNOSIS — I5022 Chronic systolic (congestive) heart failure: Secondary | ICD-10-CM | POA: Diagnosis not present

## 2018-02-12 DIAGNOSIS — I255 Ischemic cardiomyopathy: Secondary | ICD-10-CM | POA: Diagnosis not present

## 2018-02-12 LAB — GLUCOSE, CAPILLARY
Glucose-Capillary: 91 mg/dL (ref 70–99)
Glucose-Capillary: 172 mg/dL — ABNORMAL HIGH (ref 70–99)
Glucose-Capillary: 139 mg/dL — ABNORMAL HIGH (ref 70–99)

## 2018-02-12 MED ORDER — AMLODIPINE BESYLATE 5 MG PO TABS: 5.0000 mg | ORAL_TABLET | Freq: Every day | ORAL | Status: DC

## 2018-02-12 MED ORDER — INSULIN ASPART 100 UNIT/ML ~~LOC~~ SOLN: 0.0000 [IU] | Freq: Three times a day (TID) | SUBCUTANEOUS | 11 refills | Status: DC

## 2018-02-12 MED ORDER — OXYCODONE HCL 5 MG PO TABS: 5.0000 mg | ORAL_TABLET | Freq: Four times a day (QID) | ORAL | 0 refills | Status: DC | PRN

## 2018-02-12 MED ORDER — ACETAMINOPHEN 325 MG PO TABS: 650.0000 mg | ORAL_TABLET | Freq: Four times a day (QID) | ORAL | Status: DC

## 2018-02-12 NOTE — Progress Notes (Signed)
[facility] has Therapist, occupational for patient to admit there today. MD and patient's daughter aware.   Osborne Casco Roseanne Juenger LCSW [phone]

## 2018-02-12 NOTE — Discharge Summary (Signed)
[name], is a 84 y.o. male  DOB 03-11-1931  MRN [id number].  Admission date:  02/04/2018  Admitting Physician  Mauricio Annett Gula, MD  Discharge Date:  02/12/2018   Primary MD  Rankins, Fanny Dance, MD  Recommendations for primary care physician for things to follow:  -Please check CBC, BMP in 3 days, Lasix 20 mg has been held on discharge, resume once appropriate   Admission Diagnosis  fall ams poss uti   Discharge Diagnosis  fall ams poss uti    Active Problems:   Encephalopathy   Pelvic fracture [facility])      Past Medical History:  Diagnosis Date  . Arthritis    "all over" (11/12/2017)  . CAD (coronary artery disease) 11/11/2017   S/p IL STEMI 6/19:  S/p DES to pLCx and DES x 2 to OM2 // staged PCI 6/19:  DES x 2 to RCA // residual CAD at cath 6/19:  mLAD 80, dLAD 90, D1 95 tx medically // EF 40-45 by echo  . Chronic combined systolic and diastolic CHF (congestive heart failure) (HCC)    ischemic CM // s/p MI in 11/2017 >> Echo 6/19:  Moderate concentric LVH, EF 40-45, grade 1 diastolic dysfunction, inferior, inferolateral, apical inferior akinesis   . Chronic lower back pain   . CKD (chronic kidney disease), stage III (HCC)   . Constipation    takes an OTC med as needed  . Diabetic nephropathy (HCC)   . Gastric ulcer 1970s  . Glaucoma, both eyes    uses eye drops daily  . Gout    takes Allopurinol daily  . History of colon polyps   . History of ST elevation myocardial infarction (STEMI) 11/10/2017   PCI to LCx and staged PCI to RCA  . History of stress test    a. 09/2014 MV: EF 53%, no ischemia/infarct.  . Hyperlipidemia    takes Pravastatin daily  . Hypertension    takes Amlodipine and Losartan daily  . Insomnia    uses OTC sleep aide  . Joint pain   . Joint swelling    right knee   . Legally blind   . Macular degeneration   . Morbid obesity (HCC)   . Nocturia   .  Peripheral edema   . Pleurisy 20+yrs ago  . Pneumonia    "I think once when I was a kid" (11/12/2017)  . Sleep apnea   . Stroke [facility])    "I've had them; don't know anything about them"; denies residual on 11/12/2017)  . Syncope    a. 09/2014 in setting of orthostasis; b. 10/2014 Event monitor: 19 beat run of asymp NSVT; c. 01/2015 Echo: EF 60-65%, no rwma.  . Type II diabetes mellitus (HCC)    takes Metformin daily  . Urinary frequency    takes Flomax daily    Past Surgical History:  Procedure Laterality Date  . BACK SURGERY    . CATARACT EXTRACTION W/ INTRAOCULAR LENS  IMPLANT, BILATERAL Bilateral   . COLONOSCOPY    .  CORONARY ANGIOGRAPHY N/A 11/12/2017   Procedure: CORONARY ANGIOGRAPHY;  Surgeon: Lennette Bihari, MD;  Location: [facility] INVASIVE CV LAB;  Service: Cardiovascular;  Laterality: N/A;  . CORONARY STENT INTERVENTION N/A 11/12/2017   Procedure: CORONARY STENT INTERVENTION;  Surgeon: Lennette Bihari, MD;  Location: [facility] INVASIVE CV LAB;  Service: Cardiovascular;  Laterality: N/A;  . CORONARY/GRAFT ACUTE MI REVASCULARIZATION N/A 11/10/2017   Procedure: Coronary/Graft Acute MI Revascularization;  Surgeon: Lennette Bihari, MD;  Location: [facility] INVASIVE CV LAB;  Service: Cardiovascular;  Laterality: N/A;  . EYE SURGERY    . GLAUCOMA SURGERY Bilateral   . INSERTION / PLACEMENT / REVISION NEUROSTIMULATOR  2000s  . KNEE ARTHROSCOPY Right 1990s  . LEFT HEART CATH AND CORONARY ANGIOGRAPHY N/A 11/10/2017   Procedure: LEFT HEART CATH AND CORONARY ANGIOGRAPHY;  Surgeon: Lennette Bihari, MD;  Location: [facility] INVASIVE CV LAB;  Service: Cardiovascular;  Laterality: N/A;  . MINI SHUNT INSERTION  09/26/2011   Procedure: INSERTION OF MINI SHUNT;  Surgeon: Chalmers Guest, MD;  Location: [facility] OR;  Service: Ophthalmology;  Laterality: Right;  Insertion of Ahmed shunt  . POSTERIOR FUSION LUMBAR SPINE  1990s  . REVERSE SHOULDER ARTHROPLASTY Right 09/21/2013   Procedure: RIGHT  SHOULDER ARTHROPLASTY REVERSE ;  Surgeon:  [name], MD;  Location: [facility] OR;  Service: Orthopedics;  Laterality: Right;  . SHOULDER INJECTION Left 09/21/2013   Procedure: SHOULDER INJECTION;  Surgeon: [name], MD;  Location: [facility] OR;  Service: Orthopedics;  Laterality: Left;       History of present illness and  Hospital Course:     Kindly see H&P for history of present illness and admission details, please review complete Labs, Consult reports and Test reports for all details in brief  HPI  from the history and physical done on the day of admission 02/04/2018   HPI: [name] is a 84 y.o. male with medical history significant of coronary artery disease status post PCI (July 2019), chronic systolic heart failure, blindness, type 2 diabetes mellitus, dyslipidemia and obesity.  Apparently patient was at his usual state of health, he was sitting in his couch, and while trying to get up he lost his balance and fell.  Post trauma he experienced significant right hip pain to the point where he could not ambulate. The pain was 10 out of 10 in intensity, sharp in nature, no radiation, worse with movement, no improving factors.  He was noted to be confused by his son and he was brought into the hospital for further evaluation.  Patient denies any dysuria, increased urinary frequency, nausea or vomiting.  ED Course: Patient was diagnosed with a urinary tract infection and a non-displaced fracture of the right superior pubic ramus.  He received IV fluids and IV antibiotics.  Referred for admission for further evaluation.  Hospital Course   84 year old male with a history of CAD status post PCI in July 2019, chronic systolic heart failure, blindness, type 2 diabetes mellitus, dyslipidemia, obesity was brought to hospital after patient fell from couch.  Patient experienced significant right hip pain and was brought to the ED for further evaluation.  In the ED patient was found to be confused and was found to have abnormal UA started  on IV ceftriaxone for UTI.  X-ray of the pelvis with right hip showed superior pubic ramus fracture  Encephalopathy/questionable dementia-resolved, likely from underlying UTI.  May have underlying dementia with sundowning   UTI-patient had urine culture done on 01/23/2018 which showed  Klebsiella pneumoniae, Sensitive to ceftriaxone.  Patient had abnormal UA at the time of admission with positive nitrite.    Treated with total of 7 days of IV Rocephin during hospital stay  Right hip pain-discussed with radiologist, x-ray of the hip showed superior pubic ramus fracture, patient does have significant osteopenia and recommended MRI of the right hip to rule out occult hip fracture.  MRI of the right hip has been ordered, was not done as there was a question of stimulator in back.  CT of the right hip showed no hip fracture.   Right superior pubic ramus fracture-orthopedics was consulted, recommends nonoperative management patient can be weightbearing as tolerated to right lower extremity, follow-up orthopedics Dr. Jena Gauss in 1 month    CAD-stable, continue atorvastatin, ticagrelor  Diabetes mellitus type 2-diet controlled, continue sliding scale insulin with NovoLog.   Chronic kidney disease stage III- serum creatinine 1.20, at baseline.   Hypertension-blood pressure mildly elevated, patient is currently on metoprolol succinate 25 mg daily, will add amlodipine 5 mg p.o. daily    Discharge Condition:  Stable   Follow UP   Contact information for follow-up providers    Haddix, Gillie Manners, MD Follow up in 1 month(s).   Specialty:  Orthopedic Surgery Contact information: [address] [place] [address] [place] [address] [phone]            Contact information for after-discharge care    Destination    [facility] Preferred SNF .   Service:  Skilled Nursing Contact information: [address] [place] [address] [phone]                     Discharge Instructions  and  Discharge Medications    Discharge Instructions    Discharge instructions   Complete by:  As directed    Follow with Primary MD Rankins, Fanny Dance, MD or SNF physician in 3 days  Get CBC, CMP,checked  by Primary MD next visit.    Activity: As tolerated with Full fall precautions use walker/cane & assistance as needed   Disposition SNF   Diet: Heart Healthy  , with feeding assistance and aspiration precautions.  For Heart failure patients - Check your Weight same time everyday, if you gain over 2 pounds, or you develop in leg swelling, experience more shortness of breath or chest pain, call your Primary MD immediately. Follow Cardiac Low Salt Diet and 1.5 lit/day fluid restriction.   On your next visit with your primary care physician please Get Medicines reviewed and adjusted.   Please request your Prim.MD to go over all Hospital Tests and Procedure/Radiological results at the follow up, please get all Hospital records sent to your Prim MD by signing hospital release before you go home.   If you experience worsening of your admission symptoms, develop shortness of breath, life threatening emergency, suicidal or homicidal thoughts you must seek medical attention immediately by calling 911 or calling your MD immediately  if symptoms less severe.  You Must read complete instructions/literature along with all the possible adverse reactions/side effects for all the Medicines you take and that have been prescribed to you. Take any new Medicines after you have completely understood and accpet all the possible adverse reactions/side effects.   Do not drive, operating heavy machinery, perform activities at heights, swimming or participation in water activities or provide baby sitting services if your were admitted for syncope or siezures until you have seen by Primary MD or  a Neurologist and advised to do so again.  Do not drive when taking Pain  medications.    Do not take more than prescribed Pain, Sleep and Anxiety Medications  Special Instructions: If you have smoked or chewed Tobacco  in the last 2 yrs please stop smoking, stop any regular Alcohol  and or any Recreational drug use.  Wear Seat belts while driving.   Please note  You were cared for by a hospitalist during your hospital stay. If you have any questions about your discharge medications or the care you received while you were in the hospital after you are discharged, you can call the unit and asked to speak with the hospitalist on call if the hospitalist that took care of you is not available. Once you are discharged, your primary care physician will handle any further medical issues. Please note that NO REFILLS for any discharge medications will be authorized once you are discharged, as it is imperative that you return to your primary care physician (or establish a relationship with a primary care physician if you do not have one) for your aftercare needs so that they can reassess your need for medications and monitor your lab values.   Increase activity slowly   Complete by:  As directed      Allergies as of 02/12/2018   No Known Allergies     Medication List    STOP taking these medications   HYDROcodone-acetaminophen 5-325 MG tablet Commonly known as:  NORCO/VICODIN     TAKE these medications   acetaminophen 325 MG tablet Commonly known as:  TYLENOL Take 2 tablets (650 mg total) by mouth every 6 (six) hours. What changed:    medication strength  how much to take  when to take this  reasons to take this   amLODipine 5 MG tablet Commonly known as:  NORVASC Take 1 tablet (5 mg total) by mouth daily. Start taking on:  02/13/2018   aspirin 81 MG EC tablet Take 1 tablet (81 mg total) by mouth daily.   atorvastatin 80 MG tablet Commonly known as:  LIPITOR Take 1 tablet (80 mg total) by mouth daily.   furosemide 20 MG tablet Commonly known as:   LASIX Take 20 mg by mouth daily.   ICY HOT ADVANCED RELIEF 16-11 % Crea Generic drug:  Menthol-Camphor Apply 1 application topically as needed (knee pain).   insulin aspart 100 UNIT/ML injection Commonly known as:  novoLOG Inject 0-9 Units into the skin 3 (three) times daily with meals.   loperamide 2 MG capsule Commonly known as:  IMODIUM Take 2 mg by mouth as needed for diarrhea or loose stools.   metoprolol succinate 25 MG 24 hr tablet Commonly known as:  TOPROL-XL Take 1 tablet (25 mg total) by mouth daily.   nitroGLYCERIN 0.4 MG SL tablet Commonly known as:  NITROSTAT Place 1 tablet (0.4 mg total) under the tongue every 5 (five) minutes x 3 doses as needed for chest pain.   oxyCODONE 5 MG immediate release tablet Commonly known as:  Oxy IR/ROXICODONE Take 1 tablet (5 mg total) by mouth every 6 (six) hours as needed for severe pain or breakthrough pain.   prednisoLONE acetate 1 % ophthalmic suspension Commonly known as:  PRED FORTE Place 1 drop into the right eye at bedtime.   sertraline 50 MG tablet Commonly known as:  ZOLOFT Take 1 tablet (50 mg total) by mouth at bedtime.   ticagrelor 90 MG Tabs tablet Commonly known as:  BRILINTA Take 1 tablet (90 mg total) by mouth 2 (two) times daily.   timolol 0.5 % ophthalmic solution Commonly known as:  TIMOPTIC Place 1 drop into the right eye daily.   TRAVATAN Z 0.004 % Soln ophthalmic solution Generic drug:  Travoprost (BAK Free) Place 1 drop into the left eye at bedtime.         Diet and Activity recommendation: See Discharge Instructions above   Consults obtained -  Orthopedic   Major procedures and Radiology Reports - PLEASE review detailed and final reports for all details, in brief -      Dg Chest 2 View  Result Date: 02/04/2018 CLINICAL DATA:  Fall.  Altered mental status. EXAM: CHEST - 2 VIEW COMPARISON:  Chest x-ray dated January 23, 2018. FINDINGS: The heart size and mediastinal contours are  stable. Normal pulmonary vascularity. Stable elevation of the right hemidiaphragm and bibasilar atelectasis/scarring. No focal consolidation, pleural effusion, or pneumothorax. No acute osseous abnormality. IMPRESSION: Unchanged bibasilar atelectasis/scarring. No active cardiopulmonary disease. Electronically Signed   By: Obie Dredge M.D.   On: 02/04/2018 16:26   Dg Chest 2 View  Result Date: 01/23/2018 CLINICAL DATA:  Syncope today. EXAM: CHEST - 2 VIEW COMPARISON:  Chest x-rays dated 12/08/2017 and 11/10/2017. FINDINGS: Heart size and mediastinal contours are stable. Lungs are clear. No pleural effusion or pneumothorax seen. Stable elevation of the RIGHT hemidiaphragm. Degenerative changes again noted at the LEFT shoulder. RIGHT shoulder arthroplasty hardware incompletely imaged. No acute or suspicious osseous finding. IMPRESSION: No active cardiopulmonary disease. No evidence of pneumonia or pulmonary edema. Stable cardiomegaly. Electronically Signed   By: Bary Richard M.D.   On: 01/23/2018 10:48   Dg Lumbar Spine Complete  Result Date: 02/04/2018 CLINICAL DATA:  Larey Seat, right hip pain with some shortening of the right leg, hallucinations EXAM: LUMBAR SPINE - COMPLETE 4+ VIEW COMPARISON:  Lumbar spine films of 12/04/2006 FINDINGS: The lumbar vertebrae are somewhat straightened in alignment. There is degenerative change and probable fusion at L3-4 with degenerative disc disease at L4-5. No compression deformity is seen. Neurostimulator battery pack overlies the right abdomen with the electrodes extending into the lower thoracic spine region IMPRESSION: 1. No acute fracture.  No compression deformity. 2. Degenerative change in apparent fusion at L3-4. 3. Degenerative disc disease at L4-5. Electronically Signed   By: Dwyane Dee M.D.   On: 02/04/2018 16:25   Ct Head Wo Contrast  Result Date: 02/04/2018 CLINICAL DATA:  Intermittent hallucinations for the last few days. Recent fall. Patient is  anticoagulated. EXAM: CT HEAD WITHOUT CONTRAST CT CERVICAL SPINE WITHOUT CONTRAST TECHNIQUE: Multidetector CT imaging of the head and cervical spine was performed following the standard protocol without intravenous contrast. Multiplanar CT image reconstructions of the cervical spine were also generated. COMPARISON:  December 16, 2017 FINDINGS: CT HEAD FINDINGS Brain: No subdural, epidural, or subarachnoid hemorrhage. No mass effect or midline shift. Ventricles and sulci are prominent but stable. Cerebellum, brainstem, and basal cisterns are normal. Scattered white matter changes. No acute cortical ischemia or infarct. A lacunar infarct is seen in the left basal ganglia on series 4, image 17, unchanged. Vascular: Calcified atherosclerosis in the intracranial carotids. Skull: Normal. Negative for fracture or focal lesion. Sinuses/Orbits: No acute finding. Other: None. CT CERVICAL SPINE FINDINGS Alignment: No traumatic malalignment. Skull base and vertebrae: No acute fracture. No primary bone lesion or focal pathologic process. Soft tissues and spinal canal: No prevertebral fluid or swelling. No visible canal hematoma. Disc levels:  Multilevel  degenerative changes. Upper chest: Negative. Other: No other abnormalities. IMPRESSION: 1. No acute intracranial abnormalities identified. Chronic white matter changes. 2. No traumatic malalignment or fracture in the cervical spine. Multilevel degenerative changes. Electronically Signed   By: Gerome Sam III M.D   On: 02/04/2018 16:53   Ct Cervical Spine Wo Contrast  Result Date: 02/04/2018 CLINICAL DATA:  Intermittent hallucinations for the last few days. Recent fall. Patient is anticoagulated. EXAM: CT HEAD WITHOUT CONTRAST CT CERVICAL SPINE WITHOUT CONTRAST TECHNIQUE: Multidetector CT imaging of the head and cervical spine was performed following the standard protocol without intravenous contrast. Multiplanar CT image reconstructions of the cervical spine were also  generated. COMPARISON:  December 16, 2017 FINDINGS: CT HEAD FINDINGS Brain: No subdural, epidural, or subarachnoid hemorrhage. No mass effect or midline shift. Ventricles and sulci are prominent but stable. Cerebellum, brainstem, and basal cisterns are normal. Scattered white matter changes. No acute cortical ischemia or infarct. A lacunar infarct is seen in the left basal ganglia on series 4, image 17, unchanged. Vascular: Calcified atherosclerosis in the intracranial carotids. Skull: Normal. Negative for fracture or focal lesion. Sinuses/Orbits: No acute finding. Other: None. CT CERVICAL SPINE FINDINGS Alignment: No traumatic malalignment. Skull base and vertebrae: No acute fracture. No primary bone lesion or focal pathologic process. Soft tissues and spinal canal: No prevertebral fluid or swelling. No visible canal hematoma. Disc levels:  Multilevel degenerative changes. Upper chest: Negative. Other: No other abnormalities. IMPRESSION: 1. No acute intracranial abnormalities identified. Chronic white matter changes. 2. No traumatic malalignment or fracture in the cervical spine. Multilevel degenerative changes. Electronically Signed   By: Gerome Sam III M.D   On: 02/04/2018 16:53   US Renal  Result Date: 02/05/2018 CLINICAL DATA:  Urinary tract infection for 1 day. EXAM: RENAL / URINARY TRACT ULTRASOUND COMPLETE COMPARISON:  None. FINDINGS: Right Kidney: Length: 10.9 cm. Diffusely increased parenchymal echotexture with mild parenchymal thinning. Small cysts in the midpole measuring 1.6 cm maximal diameter. No hydronephrosis. Left Kidney: Length: 10.5 cm. Diffusely increased parenchymal echotexture with mild parenchymal thinning. Cyst on the midpole measuring about 4.1 cm diameter. Tiny cyst in the upper pole measuring about 9 mm diameter. No hydronephrosis. Bladder: Bladder is incompletely distended. Bladder wall is thickened. This could be due to under distention or cystitis. No filling defects. IMPRESSION:  1. Bilateral renal parenchymal atrophy and increased echotexture suggesting chronic medical renal disease. 2. Benign-appearing bilateral renal cysts.  No hydronephrosis. 3. Bladder wall thickening could be due to under distention or cystitis. Electronically Signed   By: Burman Nieves M.D.   On: 02/05/2018 03:12   Dg Pelvis Comp Min 3v  Result Date: 02/08/2018 CLINICAL DATA:  84 year old male with a history of right superior pubic ramus fracture EXAM: JUDET PELVIS - 3+ VIEW COMPARISON:  CT 02/07/2018 FINDINGS: Judet series of the pelvis again demonstrates right superior pubic ramus fracture, with minimal displacement and relatively unchanged from the comparison CT scan. The plain film series also demonstrates a nondisplaced right inferior pubic ramus fracture, which, in retrospect was present on the comparison CT. Degenerative changes of the lumbar spine. Bilateral hips demonstrate degenerative changes, with glenohumeral joints congruent. Osteopenia. IMPRESSION: Redemonstration of right superior pubic ramus fracture with minimal displacement, relatively unchanged from the comparison CT. The plain film confirms a nondisplaced right inferior pubic ramus fracture. Electronically Signed   By: Gilmer Mor D.O.   On: 02/08/2018 15:13   Ct Hip Right Wo Contrast  Result Date: 02/07/2018 CLINICAL DATA:  Right hip  pain.  Right pubic ramus fractures. EXAM: CT OF THE RIGHT HIP WITHOUT CONTRAST TECHNIQUE: Multidetector CT imaging of the right hip was performed according to the standard protocol. Multiplanar CT image reconstructions were also generated. COMPARISON:  Radiographs dated 02/04/2018 FINDINGS: Bones/Joint/Cartilage There is a comminuted fracture of the right superior pubic ramus adjacent to the pubic body. There is no discrete inferior pubic ramus fracture. The other visualized pelvic bones are intact. Proximal femur is intact. Slight diffuse joint space narrowing of the hips. Muscles and Tendons Normal. Soft  tissues Soft tissue contusion in the subcutaneous fat lateral to the right hip. IMPRESSION: 1. Comminuted fracture of the right superior pubic ramus. 2. Soft tissue contusion lateral to the right hip. 3. Proximal right femur is intact. Electronically Signed   By: Francene Boyers M.D.   On: 02/07/2018 15:02   Dg Hip Unilat W Or Wo Pelvis 2-3 Views Right  Result Date: 02/04/2018 CLINICAL DATA:  Right hip pain EXAM: DG HIP (WITH OR WITHOUT PELVIS) 2-3V RIGHT COMPARISON:  None. FINDINGS: There is no hip fracture or dislocation. There is a nondisplaced fracture of the right superior pubic ramus. There is generalized osteopenia. There is no evidence of arthropathy or other focal bone abnormality. There is peripheral vascular atherosclerotic disease. IMPRESSION: Nondisplaced fracture of the right superior pubic ramus. Given the patient's age and osteopenia, if there is persistent clinical concern for an occult hip fracture, a MRI of the hip is recommended for increased sensitivity. Electronically Signed   By: Elige Ko   On: 02/04/2018 16:24    Micro Results    Recent Results (from the past 240 hour(s))  Culture, Urine     Status: None   Collection Time: 02/05/18 10:48 PM  Result Value Ref Range Status   Specimen Description URINE, CLEAN CATCH  Final   Special Requests NONE  Final   Culture   Final    NO GROWTH Performed at [facility], [address] [place], [place] [address]    Report Status 02/07/2018 FINAL  Final       Today   Subjective:   Kennen Stammer today has no headache,no chest abdominal pain,no new weakness tingling or numbness, feels much better today.   Objective:   Blood pressure (!) 152/96, pulse 80, temperature 98.1 F (36.7 C), temperature source Oral, resp. rate 17, height 5\' 10"  (1.778 m), weight 95.7 kg, SpO2 99 %.   Intake/Output Summary (Last 24 hours) at 02/12/2018 1256 Last data filed at 02/12/2018 0622 Gross per 24 hour  Intake 120 ml  Output 800  ml  Net -680 ml    Exam Awake Alert, Oriented x 3, No new F.N deficits, Normal affect Symmetrical Chest wall movement, Good air movement bilaterally, CTAB RRR,No Gallops,Rubs or new Murmurs, No Parasternal Heave +ve B.Sounds, Abd Soft, Non tender,  No rebound -guarding or rigidity. No Cyanosis, Clubbing or edema, No new Rash or bruise  Data Review   CBC w Diff:  Lab Results  Component Value Date   WBC 5.3 02/07/2018   HGB 10.1 (L) 02/07/2018   HCT 31.6 (L) 02/07/2018   PLT 166 02/07/2018   LYMPHOPCT 20 02/04/2018   MONOPCT 10 02/04/2018   EOSPCT 9 02/04/2018   BASOPCT 1 02/04/2018    CMP:  Lab Results  Component Value Date   NA 138 02/07/2018   NA 139 01/21/2018   K 3.5 02/07/2018   CL 108 02/07/2018   CO2 23 02/07/2018   BUN 13  02/07/2018   BUN 15 01/21/2018   CREATININE 1.10 02/11/2018   PROT 7.5 12/16/2017   ALBUMIN 3.7 12/16/2017   BILITOT 0.8 12/16/2017   ALKPHOS 87 12/16/2017   AST 20 12/16/2017   ALT 19 12/16/2017  .   Total Time in preparing paper work, data evaluation and todays exam - 30 minutes  Huey Bienenstock M.D on 02/12/2018 at 12:56 PM  [facility]   Office  [phone]

## 2018-02-12 NOTE — Discharge Instructions (Signed)
Follow with Primary MD Rankins, Fanny Dance, MD or SNF physician in 3 days  Get CBC, CMP,checked  by Primary MD next visit.    Activity: As tolerated with Full fall precautions use walker/cane & assistance as needed   Disposition SNF   Diet: Heart Healthy  , with feeding assistance and aspiration precautions.  For Heart failure patients - Check your Weight same time everyday, if you gain over 2 pounds, or you develop in leg swelling, experience more shortness of breath or chest pain, call your Primary MD immediately. Follow Cardiac Low Salt Diet and 1.5 lit/day fluid restriction.   On your next visit with your primary care physician please Get Medicines reviewed and adjusted.   Please request your Prim.MD to go over all Hospital Tests and Procedure/Radiological results at the follow up, please get all Hospital records sent to your Prim MD by signing hospital release before you go home.   If you experience worsening of your admission symptoms, develop shortness of breath, life threatening emergency, suicidal or homicidal thoughts you must seek medical attention immediately by calling 911 or calling your MD immediately  if symptoms less severe.  You Must read complete instructions/literature along with all the possible adverse reactions/side effects for all the Medicines you take and that have been prescribed to you. Take any new Medicines after you have completely understood and accpet all the possible adverse reactions/side effects.   Do not drive, operating heavy machinery, perform activities at heights, swimming or participation in water activities or provide baby sitting services if your were admitted for syncope or siezures until you have seen by Primary MD or a Neurologist and advised to do so again.  Do not drive when taking Pain medications.    Do not take more than prescribed Pain, Sleep and Anxiety Medications  Special Instructions: If you have smoked or chewed Tobacco  in  the last 2 yrs please stop smoking, stop any regular Alcohol  and or any Recreational drug use.  Wear Seat belts while driving.   Please note  You were cared for by a hospitalist during your hospital stay. If you have any questions about your discharge medications or the care you received while you were in the hospital after you are discharged, you can call the unit and asked to speak with the hospitalist on call if the hospitalist that took care of you is not available. Once you are discharged, your primary care physician will handle any further medical issues. Please note that NO REFILLS for any discharge medications will be authorized once you are discharged, as it is imperative that you return to your primary care physician (or establish a relationship with a primary care physician if you do not have one) for your aftercare needs so that they can reassess your need for medications and monitor your lab values.

## 2018-02-12 NOTE — Progress Notes (Signed)
Patient will DC to: [facility] Anticipated DC date: 02/12/18 Family notified: Daughter, Conservation officer, historic buildings by: Audie Clear   Per MD patient ready for DC to [facility]. RN, patient, patient's family, and facility notified of DC. Discharge Summary sent to facility. RN given number for report [phone] Room 116). DC packet on chart. Ambulance transport requested for patient.   CSW signing off.  Cristobal Goldmann, LCSW Clinical Social Worker [phone]

## 2018-02-12 NOTE — Clinical Social Work Placement (Signed)
   CLINICAL SOCIAL WORK PLACEMENT  NOTE  Date:  02/12/2018  Patient Details  Name: [name] MRN: [id number] Date of Birth: 09/18/1930  Clinical Social Work is seeking post-discharge placement for this patient at the Skilled  Nursing Facility level of care (*CSW will initial, date and re-position this form in  chart as items are completed):  Yes   Patient/family provided with [facility] Clinical Social Work Department's list of facilities offering this level of care within the geographic area requested by the patient (or if unable, by the patient's family).  Yes   Patient/family informed of their freedom to choose among providers that offer the needed level of care, that participate in Medicare, Medicaid or managed care program needed by the patient, have an available bed and are willing to accept the patient.  Yes   Patient/family informed of [facility]'s ownership interest in [facility] and [facility], as well as of the fact that they are under no obligation to receive care at these facilities.  PASRR submitted to EDS on       PASRR number received on       Existing PASRR number confirmed on 02/10/18     FL2 transmitted to all facilities in geographic area requested by pt/family on 02/10/18     FL2 transmitted to all facilities within larger geographic area on       Patient informed that his/her managed care company has contracts with or will negotiate with certain facilities, including the following:        Yes   Patient/family informed of bed offers received.  Patient chooses bed at [facility]     Physician recommends and patient chooses bed at      Patient to be transferred to [facility] on 02/12/18.  Patient to be transferred to facility by [facility]     Patient family notified on 02/12/18 of transfer.  Name of family member notified:  Maree Krabbe, daughter     PHYSICIAN Please sign FL2     Additional Comment:     _______________________________________________ Mearl Latin, LCSWA 02/12/2018, 1:45 PM

## 2018-02-12 NOTE — Progress Notes (Signed)
Physical Therapy Treatment Patient Details Name: [name] MRN: [id number] DOB: 1930-06-29 Today's Date: 02/12/2018    History of Present Illness [name] is a 84 y.o. male diet controlled DM2, macular degeneration,  legal blindness , HLD, remote lacunar infracts , h/o CAD s/p MI with DES to OM2 and Lcx with staged DESx2 to the RCA in 12/2017, chronic systolic HF (45%)  , He was brought to the hospital folling a fall when standing from his chair. He was found to have a suerior rami pelvic fx. Per MD he is WBAT.     PT Comments    Patient progressing slowly, seems less mobile today with increased assist and needing cues and time for processing.  Assisted to set up lunch and notified nursing needs assist to finish.  Feel appropriate for SNF level rehab upon d/c.   Follow Up Recommendations  SNF     Equipment Recommendations  None recommended by PT    Recommendations for Other Services       Precautions / Restrictions Precautions Precautions: Fall Precaution Comments: legally blind    Mobility  Bed Mobility Overal bed mobility: Needs Assistance Bed Mobility: Supine to Sit     Supine to sit: Mod assist;+2 for safety/equipment     General bed mobility comments: assist for legs off bed and to lift trunk  Transfers Overall transfer level: Needs assistance Equipment used: Rolling walker (2 wheeled) Transfers: Sit to/from Stand Sit to Stand: +2 safety/equipment;From elevated surface;Min assist         General transfer comment: cues from elevated bed for hand placement and technique  Ambulation/Gait Ambulation/Gait assistance: Min assist;+2 safety/equipment Gait Distance (Feet): 5 Feet Assistive device: Rolling walker (2 wheeled) Gait Pattern/deviations: Decreased stance time - right;Antalgic     General Gait Details: cues for sequence/technique, assist for standing and stepping around to chair via long route due to visual deficits.    Stairs              Wheelchair Mobility    Modified Rankin (Stroke Patients Only)       Balance Overall balance assessment: Needs assistance Sitting-balance support: No upper extremity supported Sitting balance-Leahy Scale: Good     Standing balance support: Bilateral upper extremity supported;During functional activity Standing balance-Leahy Scale: Poor                              Cognition Arousal/Alertness: Awake/alert Behavior During Therapy: Flat affect Overall Cognitive Status: Within Functional Limits for tasks assessed                                        Exercises General Exercises - Lower Extremity Ankle Circles/Pumps: AROM;5 reps;Both;Supine Heel Slides: AAROM;5 reps;Both;Supine    General Comments        Pertinent Vitals/Pain Faces Pain Scale: Hurts even more Pain Location: Right hip, and generalized fatigue/malaise Pain Descriptors / Indicators: Aching;Sore;Tightness Pain Intervention(s): Repositioned;Monitored during session    Home Living                      Prior Function            PT Goals (current goals can now be found in the care plan section) Progress towards PT goals: Progressing toward goals    Frequency    Min 3X/week  PT Plan Current plan remains appropriate    Co-evaluation              AM-PAC PT "6 Clicks" Daily Activity  Outcome Measure  Difficulty turning over in bed (including adjusting bedclothes, sheets and blankets)?: Unable Difficulty moving from lying on back to sitting on the side of the bed? : Unable Difficulty sitting down on and standing up from a chair with arms (e.g., wheelchair, bedside commode, etc,.)?: Unable Help needed moving to and from a bed to chair (including a wheelchair)?: A Lot Help needed walking in hospital room?: A Lot Help needed climbing 3-5 steps with a railing? : A Lot 6 Click Score: 9    End of Session Equipment Utilized During Treatment:  Gait belt Activity Tolerance: Patient tolerated treatment well Patient left: with call bell/phone within reach;in chair;with chair alarm set Nurse Communication: Other (comment)(finish assisting with lunch) PT Visit Diagnosis: Other abnormalities of gait and mobility (R26.89);Muscle weakness (generalized) (M62.81)     Time: 3016-0109 PT Time Calculation (min) (ACUTE ONLY): 21 min  Charges:  $Therapeutic Activity: 8-22 mins                     Sheran Lawless, PT Acute Rehabilitation Services [phone] 02/12/2018    [name] 02/12/2018, 4:24 PM

## 2018-03-12 DIAGNOSIS — E119 Type 2 diabetes mellitus without complications: Secondary | ICD-10-CM | POA: Diagnosis not present

## 2018-03-12 DIAGNOSIS — S32519S Fracture of superior rim of unspecified pubis, sequela: Secondary | ICD-10-CM | POA: Diagnosis not present

## 2018-03-12 DIAGNOSIS — I255 Ischemic cardiomyopathy: Secondary | ICD-10-CM | POA: Diagnosis not present

## 2018-03-12 DIAGNOSIS — N183 Chronic kidney disease, stage 3 (moderate): Secondary | ICD-10-CM | POA: Diagnosis not present

## 2018-03-12 DIAGNOSIS — K279 Peptic ulcer, site unspecified, unspecified as acute or chronic, without hemorrhage or perforation: Secondary | ICD-10-CM | POA: Diagnosis not present

## 2018-03-12 DIAGNOSIS — E1140 Type 2 diabetes mellitus with diabetic neuropathy, unspecified: Secondary | ICD-10-CM | POA: Diagnosis not present

## 2018-03-12 DIAGNOSIS — G9340 Encephalopathy, unspecified: Secondary | ICD-10-CM | POA: Diagnosis not present

## 2018-03-12 DIAGNOSIS — E785 Hyperlipidemia, unspecified: Secondary | ICD-10-CM | POA: Diagnosis not present

## 2018-03-12 DIAGNOSIS — G4700 Insomnia, unspecified: Secondary | ICD-10-CM | POA: Diagnosis not present

## 2018-03-12 DIAGNOSIS — M6281 Muscle weakness (generalized): Secondary | ICD-10-CM | POA: Diagnosis not present

## 2018-03-12 DIAGNOSIS — I5040 Unspecified combined systolic (congestive) and diastolic (congestive) heart failure: Secondary | ICD-10-CM | POA: Diagnosis not present

## 2018-03-12 DIAGNOSIS — I5022 Chronic systolic (congestive) heart failure: Secondary | ICD-10-CM | POA: Diagnosis not present

## 2018-03-12 DIAGNOSIS — H409 Unspecified glaucoma: Secondary | ICD-10-CM | POA: Diagnosis not present

## 2018-03-12 DIAGNOSIS — H548 Legal blindness, as defined in USA: Secondary | ICD-10-CM | POA: Diagnosis not present

## 2018-03-12 DIAGNOSIS — R2689 Other abnormalities of gait and mobility: Secondary | ICD-10-CM | POA: Diagnosis not present

## 2018-03-12 DIAGNOSIS — S32511D Fracture of superior rim of right pubis, subsequent encounter for fracture with routine healing: Secondary | ICD-10-CM | POA: Diagnosis not present

## 2018-03-12 DIAGNOSIS — I10 Essential (primary) hypertension: Secondary | ICD-10-CM | POA: Diagnosis not present

## 2018-03-12 DIAGNOSIS — M129 Arthropathy, unspecified: Secondary | ICD-10-CM | POA: Diagnosis not present

## 2018-03-12 DIAGNOSIS — I2510 Atherosclerotic heart disease of native coronary artery without angina pectoris: Secondary | ICD-10-CM | POA: Diagnosis not present

## 2018-03-12 DIAGNOSIS — H3530 Unspecified macular degeneration: Secondary | ICD-10-CM | POA: Diagnosis not present

## 2018-04-08 ENCOUNTER — Ambulatory Visit: Payer: Medicare HMO | Admitting: Cardiovascular Disease

## 2018-04-09 ENCOUNTER — Encounter: Payer: Self-pay | Admitting: *Deleted

## 2018-05-29 ENCOUNTER — Emergency Department (HOSPITAL_COMMUNITY): Payer: Medicare HMO

## 2018-05-29 ENCOUNTER — Encounter (HOSPITAL_COMMUNITY): Payer: Self-pay

## 2018-05-29 ENCOUNTER — Observation Stay (HOSPITAL_COMMUNITY)
Admission: EM | Admit: 2018-05-29 | Discharge: 2018-05-30 | Disposition: A | Discharge status: Home / Self Care | Payer: Medicare HMO | Attending: Internal Medicine | Admitting: Internal Medicine

## 2018-05-29 ENCOUNTER — Other Ambulatory Visit: Payer: Self-pay

## 2018-05-29 DIAGNOSIS — G4730 Sleep apnea, unspecified: Secondary | ICD-10-CM | POA: Insufficient documentation

## 2018-05-29 DIAGNOSIS — Z87891 Personal history of nicotine dependence: Secondary | ICD-10-CM | POA: Diagnosis not present

## 2018-05-29 DIAGNOSIS — Z79899 Other long term (current) drug therapy: Secondary | ICD-10-CM | POA: Diagnosis not present

## 2018-05-29 DIAGNOSIS — G4700 Insomnia, unspecified: Secondary | ICD-10-CM | POA: Diagnosis not present

## 2018-05-29 DIAGNOSIS — I130 Hypertensive heart and chronic kidney disease with heart failure and stage 1 through stage 4 chronic kidney disease, or unspecified chronic kidney disease: Secondary | ICD-10-CM | POA: Diagnosis not present

## 2018-05-29 DIAGNOSIS — I959 Hypotension, unspecified: Secondary | ICD-10-CM | POA: Diagnosis present

## 2018-05-29 DIAGNOSIS — R55 Syncope and collapse: Secondary | ICD-10-CM | POA: Diagnosis not present

## 2018-05-29 DIAGNOSIS — Z955 Presence of coronary angioplasty implant and graft: Secondary | ICD-10-CM | POA: Diagnosis not present

## 2018-05-29 DIAGNOSIS — E118 Type 2 diabetes mellitus with unspecified complications: Secondary | ICD-10-CM | POA: Diagnosis present

## 2018-05-29 DIAGNOSIS — N179 Acute kidney failure, unspecified: Principal | ICD-10-CM

## 2018-05-29 DIAGNOSIS — R0989 Other specified symptoms and signs involving the circulatory and respiratory systems: Secondary | ICD-10-CM | POA: Diagnosis present

## 2018-05-29 DIAGNOSIS — I252 Old myocardial infarction: Secondary | ICD-10-CM | POA: Insufficient documentation

## 2018-05-29 DIAGNOSIS — Z79891 Long term (current) use of opiate analgesic: Secondary | ICD-10-CM | POA: Diagnosis not present

## 2018-05-29 DIAGNOSIS — R05 Cough: Secondary | ICD-10-CM | POA: Diagnosis present

## 2018-05-29 DIAGNOSIS — N183 Chronic kidney disease, stage 3 (moderate): Secondary | ICD-10-CM | POA: Diagnosis not present

## 2018-05-29 DIAGNOSIS — E860 Dehydration: Secondary | ICD-10-CM | POA: Insufficient documentation

## 2018-05-29 DIAGNOSIS — E1122 Type 2 diabetes mellitus with diabetic chronic kidney disease: Secondary | ICD-10-CM | POA: Diagnosis not present

## 2018-05-29 DIAGNOSIS — I5042 Chronic combined systolic (congestive) and diastolic (congestive) heart failure: Secondary | ICD-10-CM | POA: Diagnosis not present

## 2018-05-29 DIAGNOSIS — J069 Acute upper respiratory infection, unspecified: Secondary | ICD-10-CM

## 2018-05-29 DIAGNOSIS — I2510 Atherosclerotic heart disease of native coronary artery without angina pectoris: Secondary | ICD-10-CM | POA: Diagnosis present

## 2018-05-29 DIAGNOSIS — E1169 Type 2 diabetes mellitus with other specified complication: Secondary | ICD-10-CM | POA: Diagnosis present

## 2018-05-29 DIAGNOSIS — H5461 Unqualified visual loss, right eye, normal vision left eye: Secondary | ICD-10-CM | POA: Insufficient documentation

## 2018-05-29 DIAGNOSIS — E785 Hyperlipidemia, unspecified: Secondary | ICD-10-CM | POA: Diagnosis present

## 2018-05-29 DIAGNOSIS — Z8673 Personal history of transient ischemic attack (TIA), and cerebral infarction without residual deficits: Secondary | ICD-10-CM | POA: Diagnosis not present

## 2018-05-29 DIAGNOSIS — N189 Chronic kidney disease, unspecified: Secondary | ICD-10-CM

## 2018-05-29 DIAGNOSIS — R197 Diarrhea, unspecified: Secondary | ICD-10-CM

## 2018-05-29 LAB — CBC WITH DIFFERENTIAL/PLATELET
Abs Immature Granulocytes: 0.09 10*3/uL — ABNORMAL HIGH (ref 0.00–0.07)
Basophils Absolute: 0 10*3/uL (ref 0.0–0.1)
Basophils Relative: 0 %
Eosinophils Absolute: 0.3 10*3/uL (ref 0.0–0.5)
Eosinophils Relative: 3 %
HCT: 41.4 % (ref 39.0–52.0)
Hemoglobin: 13 g/dL (ref 13.0–17.0)
Immature Granulocytes: 1 %
Lymphocytes Relative: 13 %
Lymphs Abs: 1.2 10*3/uL (ref 0.7–4.0)
MCH: 28.9 pg (ref 26.0–34.0)
MCHC: 31.4 g/dL (ref 30.0–36.0)
MCV: 92 fL (ref 80.0–100.0)
Monocytes Absolute: 0.8 10*3/uL (ref 0.1–1.0)
Monocytes Relative: 9 %
Neutro Abs: 6.6 10*3/uL (ref 1.7–7.7)
Neutrophils Relative %: 74 %
Platelets: 227 10*3/uL (ref 150–400)
RBC: 4.5 MIL/uL (ref 4.22–5.81)
RDW: 14.6 % (ref 11.5–15.5)
WBC: 9 10*3/uL (ref 4.0–10.5)
nRBC: 0 % (ref 0.0–0.2)

## 2018-05-29 LAB — I-STAT CG4 LACTIC ACID, ED
Lactic Acid, Venous: 1.67 mmol/L (ref 0.5–1.9)
Lactic Acid, Venous: 0.95 mmol/L (ref 0.5–1.9)

## 2018-05-29 LAB — COMPREHENSIVE METABOLIC PANEL
ALT: 18 U/L (ref 0–44)
AST: 17 U/L (ref 15–41)
Albumin: 3.8 g/dL (ref 3.5–5.0)
Alkaline Phosphatase: 85 U/L (ref 38–126)
Anion gap: 12 (ref 5–15)
BUN: 30 mg/dL — ABNORMAL HIGH (ref 8–23)
CO2: 24 mmol/L (ref 22–32)
Calcium: 8.9 mg/dL (ref 8.9–10.3)
Chloride: 103 mmol/L (ref 98–111)
Creatinine, Ser: 2.41 mg/dL — ABNORMAL HIGH (ref 0.61–1.24)
GFR calc Af Amer: 27 mL/min — ABNORMAL LOW (ref >60)
GFR calc non Af Amer: 24 mL/min — ABNORMAL LOW (ref >60)
Glucose, Bld: 151 mg/dL — ABNORMAL HIGH (ref 70–99)
Potassium: 4.2 mmol/L (ref 3.5–5.1)
Sodium: 139 mmol/L (ref 135–145)
Total Bilirubin: 0.8 mg/dL (ref 0.3–1.2)
Total Protein: 7.5 g/dL (ref 6.5–8.1)

## 2018-05-29 LAB — INFLUENZA PANEL BY PCR (TYPE A & B)
Influenza A By PCR: NEGATIVE
Influenza B By PCR: NEGATIVE

## 2018-05-29 LAB — GLUCOSE, CAPILLARY: Glucose-Capillary: 121 mg/dL — ABNORMAL HIGH (ref 70–99)

## 2018-05-29 LAB — HEMOGLOBIN A1C
Hgb A1c MFr Bld: 6.1 % — ABNORMAL HIGH (ref 4.8–5.6)
Mean Plasma Glucose: 128.37 mg/dL

## 2018-05-29 MED ORDER — SODIUM CHLORIDE 0.9 % IV BOLUS (SEPSIS)
1000.0000 mL | Freq: Once | INTRAVENOUS | Status: AC
  Administered 2018-05-29: 1000 mL via INTRAVENOUS

## 2018-05-29 MED ORDER — METRONIDAZOLE IN NACL 5-0.79 MG/ML-% IV SOLN
500.0000 mg | Freq: Three times a day (TID) | INTRAVENOUS | Status: DC
  Administered 2018-05-29: 500 mg via INTRAVENOUS
  Filled 2018-05-29: qty 100

## 2018-05-29 MED ORDER — ACETAMINOPHEN 325 MG PO TABS: 650.0000 mg | ORAL_TABLET | Freq: Four times a day (QID) | ORAL | Status: DC | PRN

## 2018-05-29 MED ORDER — SODIUM CHLORIDE 0.9% FLUSH
3.0000 mL | Freq: Two times a day (BID) | INTRAVENOUS | Status: DC
  Administered 2018-05-29: 3 mL via INTRAVENOUS

## 2018-05-29 MED ORDER — SODIUM CHLORIDE 0.9 % IV SOLN
INTRAVENOUS | Status: AC
  Administered 2018-05-29: 21:00:00 via INTRAVENOUS

## 2018-05-29 MED ORDER — INSULIN ASPART 100 UNIT/ML ~~LOC~~ SOLN: 0.0000 [IU] | Freq: Three times a day (TID) | SUBCUTANEOUS | Status: DC

## 2018-05-29 MED ORDER — ACETAMINOPHEN 650 MG RE SUPP: 650.0000 mg | Freq: Four times a day (QID) | RECTAL | Status: DC | PRN

## 2018-05-29 MED ORDER — INSULIN ASPART 100 UNIT/ML ~~LOC~~ SOLN: 0.0000 [IU] | Freq: Every day | SUBCUTANEOUS | Status: DC

## 2018-05-29 MED ORDER — HEPARIN SODIUM (PORCINE) 5000 UNIT/ML IJ SOLN
5000.0000 [IU] | Freq: Three times a day (TID) | INTRAMUSCULAR | Status: DC
  Administered 2018-05-29 – 2018-05-30 (×2): 5000 [IU] via SUBCUTANEOUS
  Filled 2018-05-29 (×2): qty 1

## 2018-05-29 MED ORDER — SERTRALINE HCL 50 MG PO TABS
50.0000 mg | ORAL_TABLET | Freq: Every day | ORAL | Status: DC
  Administered 2018-05-29: 50 mg via ORAL
  Filled 2018-05-29: qty 1

## 2018-05-29 MED ORDER — GUAIFENESIN ER 600 MG PO TB12
600.0000 mg | ORAL_TABLET | Freq: Two times a day (BID) | ORAL | Status: DC
  Administered 2018-05-29 – 2018-05-30 (×2): 600 mg via ORAL
  Filled 2018-05-29 (×2): qty 1

## 2018-05-29 MED ORDER — ASPIRIN EC 81 MG PO TBEC
81.0000 mg | DELAYED_RELEASE_TABLET | Freq: Every day | ORAL | Status: DC
  Administered 2018-05-30: 81 mg via ORAL
  Filled 2018-05-29: qty 1

## 2018-05-29 MED ORDER — ATORVASTATIN CALCIUM 40 MG PO TABS
80.0000 mg | ORAL_TABLET | Freq: Every day | ORAL | Status: DC
  Administered 2018-05-30: 80 mg via ORAL
  Filled 2018-05-29: qty 2

## 2018-05-29 MED ORDER — TIMOLOL MALEATE 0.5 % OP SOLN
1.0000 [drp] | Freq: Every day | OPHTHALMIC | Status: DC
  Administered 2018-05-29 – 2018-05-30 (×2): 1 [drp] via OPHTHALMIC
  Filled 2018-05-29: qty 5

## 2018-05-29 MED ORDER — TICAGRELOR 90 MG PO TABS
90.0000 mg | ORAL_TABLET | Freq: Two times a day (BID) | ORAL | Status: DC
  Administered 2018-05-29 – 2018-05-30 (×2): 90 mg via ORAL
  Filled 2018-05-29 (×2): qty 1

## 2018-05-29 MED ORDER — PREDNISOLONE ACETATE 1 % OP SUSP
1.0000 [drp] | Freq: Every day | OPHTHALMIC | Status: DC
  Administered 2018-05-29: 1 [drp] via OPHTHALMIC
  Filled 2018-05-29: qty 5

## 2018-05-29 MED ORDER — SODIUM CHLORIDE 0.9 % IV SOLN
2.0000 g | Freq: Once | INTRAVENOUS | Status: AC
  Administered 2018-05-29: 2 g via INTRAVENOUS
  Filled 2018-05-29: qty 2

## 2018-05-29 MED ORDER — SODIUM CHLORIDE 0.9 % IV BOLUS (SEPSIS): 1000.0000 mL | Freq: Once | INTRAVENOUS | Status: DC

## 2018-05-29 MED ORDER — LATANOPROST 0.005 % OP SOLN
1.0000 [drp] | Freq: Every day | OPHTHALMIC | Status: DC
  Administered 2018-05-29: 1 [drp] via OPHTHALMIC
  Filled 2018-05-29: qty 2.5

## 2018-05-29 MED ORDER — VANCOMYCIN HCL IN DEXTROSE 1-5 GM/200ML-% IV SOLN
1000.0000 mg | Freq: Once | INTRAVENOUS | Status: AC
  Administered 2018-05-29: 1000 mg via INTRAVENOUS
  Filled 2018-05-29: qty 200

## 2018-05-29 NOTE — ED Notes (Signed)
Russella Dar (son) [phone]. Please call to notify son when pt goes to room

## 2018-05-29 NOTE — ED Triage Notes (Signed)
Pt arrives via EMS from home. Per EMS: Pt and family reports diarrhea for 2 days. Upon EMS arrival pt is hypotensive with a pressure of 80 systolic. Family reported to EMS that the pt had a near syncopal episode at home. Pt and family concerned for UTI

## 2018-05-29 NOTE — ED Provider Notes (Signed)
[facility]-EMERGENCY DEPT Provider Note   CSN: [id number] Arrival date & time: 05/29/18  1446     History   Chief Complaint Chief Complaint  Patient presents with  . Diarrhea  . Near Syncope    HPI [name] is a 84 y.o. male.  HPI 84 year old male history of coronary artery disease, hypertension, type 2 diabetes presents today complaining of several days of nasal congestion, cough, some chills, and diarrhea.  Patient is unable to tell me specifics as to the diarrhea.  He denies any nausea, vomiting, or fever.  Per nursing note EMS reported that his systolic blood pressure was 80 and he was weak on their arrival.  She also reports her initial blood pressure here 69 and recheck has been 80.  IVs have been started prior to my evaluation.  Patient states that he had diarrhea but has no other specific complaints. Past Medical History:  Diagnosis Date  . Arthritis    "all over" (11/12/2017)  . CAD (coronary artery disease) 11/11/2017   S/p IL STEMI 6/19:  S/p DES to pLCx and DES x 2 to OM2 // staged PCI 6/19:  DES x 2 to RCA // residual CAD at cath 6/19:  mLAD 80, dLAD 90, D1 95 tx medically // EF 40-45 by echo  . Chronic combined systolic and diastolic CHF (congestive heart failure) (HCC)    ischemic CM // s/p MI in 11/2017 >> Echo 6/19:  Moderate concentric LVH, EF 40-45, grade 1 diastolic dysfunction, inferior, inferolateral, apical inferior akinesis   . Chronic lower back pain   . CKD (chronic kidney disease), stage III (HCC)   . Constipation    takes an OTC med as needed  . Diabetic nephropathy (HCC)   . Gastric ulcer 1970s  . Glaucoma, both eyes    uses eye drops daily  . Gout    takes Allopurinol daily  . History of colon polyps   . History of ST elevation myocardial infarction (STEMI) 11/10/2017   PCI to LCx and staged PCI to RCA  . History of stress test    a. 09/2014 MV: EF 53%, no ischemia/infarct.  . Hyperlipidemia    takes Pravastatin  daily  . Hypertension    takes Amlodipine and Losartan daily  . Insomnia    uses OTC sleep aide  . Joint pain   . Joint swelling    right knee   . Legally blind   . Macular degeneration   . Morbid obesity (HCC)   . Nocturia   . Peripheral edema   . Pleurisy 20+yrs ago  . Pneumonia    "I think once when I was a kid" (11/12/2017)  . Sleep apnea   . Stroke [facility])    "I've had them; don't know anything about them"; denies residual on 11/12/2017)  . Syncope    a. 09/2014 in setting of orthostasis; b. 10/2014 Event monitor: 19 beat run of asymp NSVT; c. 01/2015 Echo: EF 60-65%, no rwma.  . Type II diabetes mellitus (HCC)    takes Metformin daily  . Urinary frequency    takes Flomax daily    Patient Active Problem List   Diagnosis Date Noted  . Pelvic fracture (HCC) 02/10/2018  . Recurrent syncope 01/23/2018  . Diarrhea   . Chest pain 12/08/2017  . Ischemic cardiomyopathy 11/28/2017  . Neurocognitive deficits 11/19/2017  . Bilious vomiting   . Ileus, unspecified (HCC) 11/13/2017  . Sleep apnea 11/13/2017  . Obesity 11/13/2017  .  Stage III chronic kidney disease (HCC) 11/11/2017  . Acute on chronic combined systolic and diastolic CHF (congestive heart failure) (HCC) 11/11/2017  . CAD (coronary artery disease) 11/11/2017  . Acute ST elevation myocardial infarction (STEMI) of inferolateral wall (HCC) 11/10/2017  . ST elevation myocardial infarction (STEMI) (HCC)   . AKI (acute kidney injury) (HCC) 01/22/2015  . Lactic acidosis 01/22/2015  . Syncope and collapse   . HTN (hypertension), benign   . Encephalopathy 09/07/2014  . Osteoarthrosis, unspecified whether generalized or localized, shoulder region 09/21/2013  . Gout 12/23/2012  . Hypotension 12/21/2012  . Type 2 diabetes mellitus with complication, without long-term current use of insulin (HCC) 12/21/2012  . Hyperlipidemia   . Morbid obesity (HCC)     Past Surgical History:  Procedure Laterality Date  . BACK SURGERY      . CATARACT EXTRACTION W/ INTRAOCULAR LENS  IMPLANT, BILATERAL Bilateral   . COLONOSCOPY    . CORONARY ANGIOGRAPHY N/A 11/12/2017   Procedure: CORONARY ANGIOGRAPHY;  Surgeon: Lennette Bihari, MD;  Location[facility] INVASIVE CV LAB;  Service: Cardiovascular;  Laterality: N/A;  . CORONARY STENT INTERVENTION N/A 11/12/2017   Procedure: CORONARY STENT INTERVENTION;  Surgeon: Lennette Bihari, MD;  Location: [facility] INVASIVE CV LAB;  Service: Cardiovascular;  Laterality: N/A;  . CORONARY/GRAFT ACUTE MI REVASCULARIZATION N/A 11/10/2017   Procedure: Coronary/Graft Acute MI Revascularization;  Surgeon: Lennette Bihari, MD;  Location: [facility] INVASIVE CV LAB;  Service: Cardiovascular;  Laterality: N/A;  . EYE SURGERY    . GLAUCOMA SURGERY Bilateral   . INSERTION / PLACEMENT / REVISION NEUROSTIMULATOR  2000s  . KNEE ARTHROSCOPY Right 1990s  . LEFT HEART CATH AND CORONARY ANGIOGRAPHY N/A 11/10/2017   Procedure: LEFT HEART CATH AND CORONARY ANGIOGRAPHY;  Surgeon: Lennette Bihari, MD;  Location: [facility] INVASIVE CV LAB;  Service: Cardiovascular;  Laterality: N/A;  . MINI SHUNT INSERTION  09/26/2011   Procedure: INSERTION OF MINI SHUNT;  Surgeon: Chalmers Guest, MD;  Location: [facility] OR;  Service: Ophthalmology;  Laterality: Right;  Insertion of Ahmed shunt  . POSTERIOR FUSION LUMBAR SPINE  1990s  . REVERSE SHOULDER ARTHROPLASTY Right 09/21/2013   Procedure: RIGHT  SHOULDER ARTHROPLASTY REVERSE ;  Surgeon: [name], MD;  Location: [facility] OR;  Service: Orthopedics;  Laterality: Right;  . SHOULDER INJECTION Left 09/21/2013   Procedure: SHOULDER INJECTION;  Surgeon: [name], MD;  Location: [facility] OR;  Service: Orthopedics;  Laterality: Left;        Home Medications    Prior to Admission medications   Medication Sig Start Date End Date Taking? Authorizing Provider  acetaminophen (TYLENOL) 325 MG tablet Take 2 tablets (650 mg total) by mouth every 6 (six) hours. 02/12/18   Elgergawy, Leana Roe, MD  amLODipine (NORVASC) 5 MG tablet  Take 1 tablet (5 mg total) by mouth daily. 02/13/18   Elgergawy, Leana Roe, MD  aspirin EC 81 MG EC tablet Take 1 tablet (81 mg total) by mouth daily. 11/19/17   Barrett, Joline Salt, PA-C  atorvastatin (LIPITOR) 80 MG tablet Take 1 tablet (80 mg total) by mouth daily. 12/09/17   Robbie Lis M, PA-C  furosemide (LASIX) 20 MG tablet Take 20 mg by mouth daily. 01/24/18   [provider]  insulin aspart (NOVOLOG) 100 UNIT/ML injection Inject 0-9 Units into the skin 3 (three) times daily with meals. 02/12/18   Elgergawy, Leana Roe, MD  loperamide (IMODIUM) 2 MG capsule Take 2 mg by mouth as needed for diarrhea or loose  stools.    [provider]  Menthol-Camphor (ICY HOT ADVANCED RELIEF) 16-11 % CREA Apply 1 application topically as needed (knee pain).    [provider]  metoprolol succinate (TOPROL-XL) 25 MG 24 hr tablet Take 1 tablet (25 mg total) by mouth daily. 12/09/17   Robbie LisSimmons, Brittainy M, PA-C  nitroGLYCERIN (NITROSTAT) 0.4 MG SL tablet Place 1 tablet (0.4 mg total) under the tongue every 5 (five) minutes x 3 doses as needed for chest pain. 12/09/17   Allayne ButcherSimmons, Brittainy M, PA-C  oxyCODONE (OXY IR/ROXICODONE) 5 MG immediate release tablet Take 1 tablet (5 mg total) by mouth every 6 (six) hours as needed for severe pain or breakthrough pain. 02/12/18   Elgergawy, Leana Roeawood S, MD  prednisoLONE acetate (PRED FORTE) 1 % ophthalmic suspension Place 1 drop into the right eye at bedtime. 11/29/17   Medina-Vargas, Monina C, NP  sertraline (ZOLOFT) 50 MG tablet Take 1 tablet (50 mg total) by mouth at bedtime. 11/29/17   Medina-Vargas, Monina C, NP  ticagrelor (BRILINTA) 90 MG TABS tablet Take 1 tablet (90 mg total) by mouth 2 (two) times daily. 12/09/17   Robbie LisSimmons, Brittainy M, PA-C  timolol (TIMOPTIC) 0.5 % ophthalmic solution Place 1 drop into the right eye daily. 11/29/17   Medina-Vargas, Monina C, NP  TRAVATAN Z 0.004 % SOLN ophthalmic solution Place 1 drop into the left eye at bedtime.  11/29/17   Medina-Vargas, Margit BandaMonina C, NP    Family History Family History  Problem Relation Age of Onset  . Cancer Mother   . Alzheimer's disease Maternal Aunt   . Anesthesia problems Neg Hx     Social History Social History   Tobacco Use  . Smoking status: Former Smoker    Packs/day: 2.00    Types: Cigarettes  . Smokeless tobacco: Never Used  . Tobacco comment: 11/12/2017 "nothing since the 1980s"  Substance Use Topics  . Alcohol use: Not Currently    Comment: 11/12/2017 "nothing since the 1980s"  . Drug use: Never     Allergies   Patient has no known allergies.   Review of Systems Review of Systems  All other systems reviewed and are negative.    Physical Exam Updated Vital Signs BP (!) 80/52   Pulse (!) 57   Temp (!) 97.5 F (36.4 C) (Oral)   Resp 15   Wt 95.3 kg   SpO2 96%   BMI 30.13 kg/m   Physical Exam Vitals signs and nursing note reviewed. Exam conducted with a chaperone present.  Constitutional:      Appearance: Normal appearance.  HENT:     Head: Normocephalic and atraumatic.     Nose: Nose normal.     Mouth/Throat:     Mouth: Mucous membranes are moist.  Eyes:     Comments: Right eye obscured from scarring Left eye pupil is small minimally reactive  Neck:     Musculoskeletal: Normal range of motion and neck supple.  Cardiovascular:     Rate and Rhythm: Normal rate.  Pulmonary:     Effort: Pulmonary effort is normal.     Breath sounds: Normal breath sounds.  Abdominal:     General: Abdomen is flat.  Musculoskeletal: Normal range of motion.  Skin:    General: Skin is warm and dry.     Capillary Refill: Capillary refill takes less than 2 seconds.  Neurological:     General: No focal deficit present.     Mental Status: He is alert.  Psychiatric:  Mood and Affect: Mood normal.      ED Treatments / Results  Labs (all labs ordered are listed, but only abnormal results are displayed) Labs Reviewed  COMPREHENSIVE METABOLIC  PANEL  CBC WITH DIFFERENTIAL/PLATELET  URINALYSIS, ROUTINE W REFLEX MICROSCOPIC  I-STAT CG4 LACTIC ACID, ED    EKG EKG Interpretation  Date/Time:  Thursday May 29 2018 15:47:03 EST Ventricular Rate:  65 PR Interval:    QRS Duration: 148 QT Interval:  482 QTC Calculation: 502 R Axis:   -77 Text Interpretation:  Sinus rhythm Prolonged PR interval RBBB and LAFB No significant change since last tracing 04 February 2018 Confirmed by Margarita Grizzle [phone]) on 05/29/2018 3:59:40 PM   Radiology Dg Chest Port 1 View  Result Date: 05/29/2018 CLINICAL DATA:  Hypotension. EXAM: PORTABLE CHEST 1 VIEW COMPARISON:  02/04/2018 FINDINGS: The heart is enlarged but stable. Mild tortuosity of the thoracic aorta. Stable moderate eventration right hemidiaphragm with some overlying vascular crowding and atelectasis. No infiltrates, edema or effusions. The bony thorax is intact. Stable right shoulder total arthroplasty hardware and stable advanced degenerative changes involving the left shoulder. Spinal cord stimulators are noted in the midthoracic spine area. IMPRESSION: Cardiac enlargement but no acute pulmonary findings. Electronically Signed   By: Rudie Meyer M.D.   On: 05/29/2018 16:05    Procedures Procedures (including critical care time)  Medications Ordered in ED Medications - No data to display   Initial Impression / Assessment and Plan / ED Course  I have reviewed the triage vital signs and the nursing notes.  Pertinent labs & imaging results that were available during my care of the patient were reviewed by me and considered in my medical decision making (see chart for details).     84 year old male presents today with URI symptoms followed by diarrhea.  Patient here he is hypothermic with a temperature of 96 and has been hypotensive with a blood pressure of 80.  He also has a acute kidney injury with creatinine increased to 2.41 with comparison creatinine of 1.1 on February 11, 2018. Patient is receiving IV fluids and broad-spectrum antibiotics here. Initial lactic acid is 1.67  BP increased to 111/61 after one liter ns  Plan admission for further evaluation and treatment  Vitals:   05/29/18 1730 05/29/18 1800  BP: 132/79 136/84  Pulse: 69 73  Resp: 18 15  Temp:    SpO2: 96% 96%   Discussed with Dr. Allena Katz and will see for admission.  Final Clinical Impressions(s) / ED Diagnoses   Final diagnoses:  AKI (acute kidney injury) (HCC)  Diarrhea, unspecified type  Viral upper respiratory tract infection    ED Discharge Orders    None       Margarita Grizzle, MD 05/29/18 1814

## 2018-05-29 NOTE — ED Notes (Signed)
Bed: QI29 Expected date:  Expected time:  Means of arrival:  Comments: EMS-weakness/hypotension

## 2018-05-29 NOTE — ED Notes (Signed)
Blood cultures obtained prior to abx administration

## 2018-05-29 NOTE — ED Notes (Signed)
Condom Cath placed on pt. 

## 2018-05-29 NOTE — ED Notes (Signed)
ED TO INPATIENT HANDOFF REPORT  Name/Age/Gender [name] 84 y.o. male  Code Status Code Status History    Date Active Date Inactive Code Status Order ID Comments User Context   02/04/2018 2120 02/12/2018 2145 Full Code [id number]  Coralie Keens, MD Inpatient   01/23/2018 1825 01/24/2018 2055 Full Code [id number]  Albertine Grates, MD Inpatient   12/08/2017 1106 12/09/2017 1526 Full Code [id number]  Beatrice Lecher, PA-C ED   11/10/2017 1415 11/18/2017 2030 Full Code [id number]  Lennette Bihari, MD Inpatient   11/10/2017 1415 11/10/2017 1415 Full Code [id number]  Creig Hines, NP Inpatient   01/22/2015 0605 01/24/2015 1727 Full Code [id number]  Levie Heritage, DO Inpatient   09/07/2014 0606 09/08/2014 2103 Full Code [id number]  Floydene Flock, MD ED   09/07/2014 0119 09/07/2014 0606 Full Code [id number]  Floydene Flock, MD ED   09/21/2013 1836 09/23/2013 1540 Full Code [id number]  [name], MD Inpatient   12/21/2012 0051 12/30/2012 1725 Full Code [id number]  Ron Parker, MD ED    Advance Directive Documentation     Most Recent Value  Type of Advance Directive  Healthcare Power of Attorney  Pre-existing out of facility DNR order (yellow form or pink MOST form)  -  "MOST" Form in Place?  -      Home/SNF/Other Home  Chief Complaint Hypotension   Level of Care/Admitting Diagnosis ED Disposition    ED Disposition Condition Comment   Admit  Hospital Area: [facility] [[id number]]  Level of Care: Telemetry [5]  Admit to tele based on following criteria: Other see comments  Comments: Hypotension  Diagnosis: Acute kidney injury [facility]) [007622]  Admitting Physician: Charlsie Quest [[id number]]  Attending Physician: Charlsie Quest [[id number]]  PT Class (Do Not Modify): Observation [104]  PT Acc Code (Do Not Modify): Observation [10022]       Medical History Past Medical History:  Diagnosis Date  . Arthritis    "all over" (11/12/2017)  . CAD (coronary artery  disease) 11/11/2017   S/p IL STEMI 6/19:  S/p DES to pLCx and DES x 2 to OM2 // staged PCI 6/19:  DES x 2 to RCA // residual CAD at cath 6/19:  mLAD 80, dLAD 90, D1 95 tx medically // EF 40-45 by echo  . Chronic combined systolic and diastolic CHF (congestive heart failure) (HCC)    ischemic CM // s/p MI in 11/2017 >> Echo 6/19:  Moderate concentric LVH, EF 40-45, grade 1 diastolic dysfunction, inferior, inferolateral, apical inferior akinesis   . Chronic lower back pain   . CKD (chronic kidney disease), stage III (HCC)   . Constipation    takes an OTC med as needed  . Diabetic nephropathy (HCC)   . Gastric ulcer 1970s  . Glaucoma, both eyes    uses eye drops daily  . Gout    takes Allopurinol daily  . History of colon polyps   . History of ST elevation myocardial infarction (STEMI) 11/10/2017   PCI to LCx and staged PCI to RCA  . History of stress test    a. 09/2014 MV: EF 53%, no ischemia/infarct.  . Hyperlipidemia    takes Pravastatin daily  . Hypertension    takes Amlodipine and Losartan daily  . Insomnia    uses OTC sleep aide  . Joint pain   . Joint swelling    right knee   . Legally blind   . Macular degeneration   .  Morbid obesity (HCC)   . Nocturia   . Peripheral edema   . Pleurisy 20+yrs ago  . Pneumonia    "I think once when I was a kid" (11/12/2017)  . Sleep apnea   . Stroke [facility](HCC)    "I've had them; don't know anything about them"; denies residual on 11/12/2017)  . Syncope    a. 09/2014 in setting of orthostasis; b. 10/2014 Event monitor: 19 beat run of asymp NSVT; c. 01/2015 Echo: EF 60-65%, no rwma.  . Type II diabetes mellitus (HCC)    takes Metformin daily  . Urinary frequency    takes Flomax daily    Allergies No Known Allergies  IV Location/Drains/Wounds Patient Lines/Drains/Airways Status   Active Line/Drains/Airways    Name:   Placement date:   Placement time:   Site:   Days:   Peripheral IV 05/29/18 Left Antecubital   05/29/18    -    Antecubital    less than 1   Peripheral IV 05/29/18 Right Antecubital   05/29/18    1524    Antecubital   less than 1          Labs/Imaging Results for orders placed or performed during the hospital encounter of 05/29/18 (from the past 48 hour(s))  I-Stat CG4 Lactic Acid, ED     Status: None   Collection Time: 05/29/18  3:33 PM  Result Value Ref Range   Lactic Acid, Venous 1.67 0.5 - 1.9 mmol/L  Comprehensive metabolic panel     Status: Abnormal   Collection Time: 05/29/18  3:43 PM  Result Value Ref Range   Sodium 139 135 - 145 mmol/L   Potassium 4.2 3.5 - 5.1 mmol/L   Chloride 103 98 - 111 mmol/L   CO2 24 22 - 32 mmol/L   Glucose, Bld 151 (H) 70 - 99 mg/dL   BUN 30 (H) 8 - 23 mg/dL   Creatinine, Ser 0.862.41 (H) 0.61 - 1.24 mg/dL   Calcium 8.9 8.9 - 57.810.3 mg/dL   Total Protein 7.5 6.5 - 8.1 g/dL   Albumin 3.8 3.5 - 5.0 g/dL   AST 17 15 - 41 U/L   ALT 18 0 - 44 U/L   Alkaline Phosphatase 85 38 - 126 U/L   Total Bilirubin 0.8 0.3 - 1.2 mg/dL   GFR calc non Af Amer 24 (L) >60 mL/min   GFR calc Af Amer 27 (L) >60 mL/min   Anion gap 12 5 - 15    Comment: Performed at [facility], [address] [place], [place] [address]  CBC with Differential     Status: Abnormal   Collection Time: 05/29/18  3:43 PM  Result Value Ref Range   WBC 9.0 4.0 - 10.5 K/uL   RBC 4.50 4.22 - 5.81 MIL/uL   Hemoglobin 13.0 13.0 - 17.0 g/dL   HCT 95.241.4 84.139.0 - 32.452.0 %   MCV 92.0 80.0 - 100.0 fL   MCH 28.9 26.0 - 34.0 pg   MCHC 31.4 30.0 - 36.0 g/dL   RDW 40.114.6 02.711.5 - 25.315.5 %   Platelets 227 150 - 400 K/uL   nRBC 0.0 0.0 - 0.2 %   Neutrophils Relative % 74 %   Neutro Abs 6.6 1.7 - 7.7 K/uL   Lymphocytes Relative 13 %   Lymphs Abs 1.2 0.7 - 4.0 K/uL   Monocytes Relative 9 %   Monocytes Absolute 0.8 0.1 - 1.0 K/uL   Eosinophils Relative 3 %   Eosinophils  Absolute 0.3 0.0 - 0.5 K/uL   Basophils Relative 0 %   Basophils Absolute 0.0 0.0 - 0.1 K/uL   Immature Granulocytes 1 %   Abs Immature  Granulocytes 0.09 (H) 0.00 - 0.07 K/uL    Comment: Performed at [facility], [address] [place], [place] [address]  Influenza panel by PCR (type A & B)     Status: None   Collection Time: 05/29/18  4:07 PM  Result Value Ref Range   Influenza A By PCR NEGATIVE NEGATIVE   Influenza B By PCR NEGATIVE NEGATIVE    Comment: (NOTE) The Xpert Xpress Flu assay is intended as an aid in the diagnosis of  influenza and should not be used as a sole basis for treatment.  This  assay is FDA approved for nasopharyngeal swab specimens only. Nasal  washings and aspirates are unacceptable for Xpert Xpress Flu testing. Performed at [facility], [address] [place], [place] [address]   I-Stat CG4 Lactic Acid, ED     Status: None   Collection Time: 05/29/18  6:30 PM  Result Value Ref Range   Lactic Acid, Venous 0.95 0.5 - 1.9 mmol/L   Dg Chest Port 1 View  Result Date: 05/29/2018 CLINICAL DATA:  Hypotension. EXAM: PORTABLE CHEST 1 VIEW COMPARISON:  02/04/2018 FINDINGS: The heart is enlarged but stable. Mild tortuosity of the thoracic aorta. Stable moderate eventration right hemidiaphragm with some overlying vascular crowding and atelectasis. No infiltrates, edema or effusions. The bony thorax is intact. Stable right shoulder total arthroplasty hardware and stable advanced degenerative changes involving the left shoulder. Spinal cord stimulators are noted in the midthoracic spine area. IMPRESSION: Cardiac enlargement but no acute pulmonary findings. Electronically Signed   By: Rudie Meyer M.D.   On: 05/29/2018 16:05    Pending Labs Unresulted Labs (From admission, onward)    Start     Ordered   05/29/18 1547  Blood Culture (routine x 2)  BLOOD CULTURE X 2,   STAT     05/29/18 1546   05/29/18 1519  Urinalysis, Routine w reflex microscopic  ONCE - STAT,   STAT     05/29/18 1520          Vitals/Pain Today's Vitals   05/29/18 1700 05/29/18 1730 05/29/18  1800 05/29/18 1826  BP: 118/77 132/79 136/84   Pulse: 64 69 73 74  Resp: 14 18 15 17   Temp:      TempSrc:      SpO2: 98% 96% 96% 98%  Weight:      PainSc:        Isolation Precautions Droplet precaution  Medications Medications  sodium chloride 0.9 % bolus 1,000 mL (0 mLs Intravenous Stopped 05/29/18 1639)    And  sodium chloride 0.9 % bolus 1,000 mL (0 mLs Intravenous Stopped 05/29/18 1736)    And  sodium chloride 0.9 % bolus 1,000 mL (0 mLs Intravenous Hold 05/29/18 1748)  metroNIDAZOLE (FLAGYL) IVPB 500 mg (0 mg Intravenous Stopped 05/29/18 1737)  ceFEPIme (MAXIPIME) 2 g in sodium chloride 0.9 % 100 mL IVPB (0 g Intravenous Stopped 05/29/18 1639)  vancomycin (VANCOCIN) IVPB 1000 mg/200 mL premix (1,000 mg Intravenous New Bag/Given 05/29/18 1736)    Mobility walks with device

## 2018-05-29 NOTE — Progress Notes (Signed)
A consult was received from an ED physician for Vancomycin and Cefepime per pharmacy dosing.  The patient's profile has been reviewed for ht/wt/allergies/indication/available labs.    A one time order has been placed for Vancomycin 1gm, Cefepime 2gm.  Further antibiotics/pharmacy consults should be ordered by admitting physician if indicated.                       Thank you,  Otho Bellows 05/29/2018  3:50 PM

## 2018-05-29 NOTE — H&P (Signed)
History and Physical    [name] ZOX:[id number] DOB: September 29, 1930 DOA: 05/29/2018  PCP: Clayborn Heron, MD  Patient coming from: Home  I have personally briefly reviewed patient's old medical records in [facility] Link  Chief Complaint: Chest cold and diarrhea  HPI: [name] is a 84 y.o. male with medical history significant for CAD s/p DES (12/2017), T2DM, HTN, CKD Stage III, HLD, Chronic combined systolic and diastolic CHF (EF 81-19%), and blindness who presents to the ED with several days of nonproductive cough, chest congestion, and diarrhea.  He was in the bathroom at home earlier today and became lightheaded with a near syncopal episode.  History is limited from patient and is otherwise obtained from EDP and chart review.  EMS were called to his home and reportedly had a systolic blood pressure of 80.  Patient denies any fevers, diaphoresis, chest pain, dyspnea, abdominal pain, or dysuria.  He does report decreased urinary frequency beginning today.  He reports taking Tylenol for pain and denies NSAID use.  ED Course:  Initial vitals showed BP 111/61, pulse 62, RR 16, temp 96.2 Fahrenheit, SPO2 98% on room air.  Labs were notable for BUN 30, creatinine 2.41 (compared to 1.1 on 02/11/2018), lactic acid 1.67.  CBC was within normal limits.  Influenza panel was negative.  Chest x-ray was negative for acute infiltrates, edema, or effusions.  Patient was started on IV fluid resuscitation with 3 L normal saline with good response in blood pressure.  Blood cultures were drawn and patient was started on vancomycin, cefepime, metronidazole.  Urinalysis was collected and pending.  Review of Systems: As per HPI otherwise 10 point review of systems negative.    Past Medical History:  Diagnosis Date  . Arthritis    "all over" (11/12/2017)  . CAD (coronary artery disease) 11/11/2017   S/p IL STEMI 6/19:  S/p DES to pLCx and DES x 2 to OM2 // staged PCI 6/19:  DES x 2 to RCA //  residual CAD at cath 6/19:  mLAD 80, dLAD 90, D1 95 tx medically // EF 40-45 by echo  . Chronic combined systolic and diastolic CHF (congestive heart failure) (HCC)    ischemic CM // s/p MI in 11/2017 >> Echo 6/19:  Moderate concentric LVH, EF 40-45, grade 1 diastolic dysfunction, inferior, inferolateral, apical inferior akinesis   . Chronic lower back pain   . CKD (chronic kidney disease), stage III (HCC)   . Constipation    takes an OTC med as needed  . Diabetic nephropathy (HCC)   . Gastric ulcer 1970s  . Glaucoma, both eyes    uses eye drops daily  . Gout    takes Allopurinol daily  . History of colon polyps   . History of ST elevation myocardial infarction (STEMI) 11/10/2017   PCI to LCx and staged PCI to RCA  . History of stress test    a. 09/2014 MV: EF 53%, no ischemia/infarct.  . Hyperlipidemia    takes Pravastatin daily  . Hypertension    takes Amlodipine and Losartan daily  . Insomnia    uses OTC sleep aide  . Joint pain   . Joint swelling    right knee   . Legally blind   . Macular degeneration   . Morbid obesity (HCC)   . Nocturia   . Peripheral edema   . Pleurisy 20+yrs ago  . Pneumonia    "I think once when I was a kid" (11/12/2017)  .  Sleep apnea   . Stroke [facility](HCC)    "I've had them; don't know anything about them"; denies residual on 11/12/2017)  . Syncope    a. 09/2014 in setting of orthostasis; b. 10/2014 Event monitor: 19 beat run of asymp NSVT; c. 01/2015 Echo: EF 60-65%, no rwma.  . Type II diabetes mellitus (HCC)    takes Metformin daily  . Urinary frequency    takes Flomax daily    Past Surgical History:  Procedure Laterality Date  . BACK SURGERY    . CATARACT EXTRACTION W/ INTRAOCULAR LENS  IMPLANT, BILATERAL Bilateral   . COLONOSCOPY    . CORONARY ANGIOGRAPHY N/A 11/12/2017   Procedure: CORONARY ANGIOGRAPHY;  Surgeon: Lennette BihariKelly, Thomas A, MD;  [facility];  Service: Cardiovascular;  Laterality: N/A;  . CORONARY STENT INTERVENTION N/A  11/12/2017   Procedure: CORONARY STENT INTERVENTION;  Surgeon: Lennette BihariKelly, Thomas A, MD;  Location: [facility] INVASIVE CV LAB;  Service: Cardiovascular;  Laterality: N/A;  . CORONARY/GRAFT ACUTE MI REVASCULARIZATION N/A 11/10/2017   Procedure: Coronary/Graft Acute MI Revascularization;  Surgeon: Lennette BihariKelly, Thomas A, MD;  Location: [facility] INVASIVE CV LAB;  Service: Cardiovascular;  Laterality: N/A;  . EYE SURGERY    . GLAUCOMA SURGERY Bilateral   . INSERTION / PLACEMENT / REVISION NEUROSTIMULATOR  2000s  . KNEE ARTHROSCOPY Right 1990s  . LEFT HEART CATH AND CORONARY ANGIOGRAPHY N/A 11/10/2017   Procedure: LEFT HEART CATH AND CORONARY ANGIOGRAPHY;  Surgeon: Lennette BihariKelly, Thomas A, MD;  Location: [facility] INVASIVE CV LAB;  Service: Cardiovascular;  Laterality: N/A;  . MINI SHUNT INSERTION  09/26/2011   Procedure: INSERTION OF MINI SHUNT;  Surgeon: Chalmers Guestoy Whitaker, MD;  [facility];  Service: Ophthalmology;  Laterality: Right;  Insertion of Ahmed shunt  . POSTERIOR FUSION LUMBAR SPINE  1990s  . REVERSE SHOULDER ARTHROPLASTY Right 09/21/2013   Procedure: RIGHT  SHOULDER ARTHROPLASTY REVERSE ;  Surgeon: Verlee RossettiSteven R Norris, MD;  [facility];  Service: Orthopedics;  Laterality: Right;  . SHOULDER INJECTION Left 09/21/2013   Procedure: SHOULDER INJECTION;  Surgeon: Verlee RossettiSteven R Norris, MD;  [facility];  Service: Orthopedics;  Laterality: Left;     reports that he has quit smoking. His smoking use included cigarettes. He smoked 2.00 packs per day. He has never used smokeless tobacco. He reports previous alcohol use. He reports that he does not use drugs.  No Known Allergies  Family History  Problem Relation Age of Onset  . Cancer Mother   . Alzheimer's disease Maternal Aunt   . Anesthesia problems Neg Hx      Prior to Admission medications   Medication Sig Start Date End Date Taking? Authorizing Provider  acetaminophen (TYLENOL) 325 MG tablet Take 2 tablets (650 mg total) by mouth every 6 (six) hours. Patient taking differently:  Take 650 mg by mouth every 6 (six) hours as needed for mild pain.  02/12/18  Yes Elgergawy, Leana Roeawood S, MD  amLODipine (NORVASC) 5 MG tablet Take 1 tablet (5 mg total) by mouth daily. 02/13/18  Yes Elgergawy, Leana Roeawood S, MD  aspirin EC 81 MG EC tablet Take 1 tablet (81 mg total) by mouth daily. 11/19/17  Yes Barrett, Joline Salthonda G, PA-C  atorvastatin (LIPITOR) 80 MG tablet Take 1 tablet (80 mg total) by mouth daily. 12/09/17  Yes Robbie LisSimmons, Brittainy M, PA-C  furosemide (LASIX) 20 MG tablet Take 20 mg by mouth daily. 01/24/18  Yes [provider]  insulin aspart (NOVOLOG) 100 UNIT/ML injection Inject 0-9 Units into the skin  3 (three) times daily with meals. 02/12/18  Yes Elgergawy, Leana Roe, MD  loperamide (IMODIUM) 2 MG capsule Take 2 mg by mouth as needed for diarrhea or loose stools.   Yes [provider]  Menthol-Camphor (ICY HOT ADVANCED RELIEF) 16-11 % CREA Apply 1 application topically as needed (knee pain).   Yes [provider]  metoprolol succinate (TOPROL-XL) 25 MG 24 hr tablet Take 1 tablet (25 mg total) by mouth daily. 12/09/17  Yes Robbie Lis M, PA-C  nitroGLYCERIN (NITROSTAT) 0.4 MG SL tablet Place 1 tablet (0.4 mg total) under the tongue every 5 (five) minutes x 3 doses as needed for chest pain. 12/09/17  Yes Robbie Lis M, PA-C  oxyCODONE (OXY IR/ROXICODONE) 5 MG immediate release tablet Take 1 tablet (5 mg total) by mouth every 6 (six) hours as needed for severe pain or breakthrough pain. 02/12/18  Yes Elgergawy, Leana Roe, MD  prednisoLONE acetate (PRED FORTE) 1 % ophthalmic suspension Place 1 drop into the right eye at bedtime. 11/29/17  Yes Medina-Vargas, Monina C, NP  sertraline (ZOLOFT) 50 MG tablet Take 1 tablet (50 mg total) by mouth at bedtime. 11/29/17  Yes Medina-Vargas, Monina C, NP  ticagrelor (BRILINTA) 90 MG TABS tablet Take 1 tablet (90 mg total) by mouth 2 (two) times daily. 12/09/17  Yes Sharol Harness, Brittainy M, PA-C  timolol (TIMOPTIC) 0.5 % ophthalmic  solution Place 1 drop into the right eye daily. 11/29/17  Yes Medina-Vargas, Monina C, NP  TRAVATAN Z 0.004 % SOLN ophthalmic solution Place 1 drop into the left eye at bedtime. 11/29/17  Yes Medina-Vargas, Monina C, NP    Physical Exam: Vitals:   05/29/18 1730 05/29/18 1800 05/29/18 1826 05/29/18 1902  BP: 132/79 136/84  110/67  Pulse: 69 73 74 78  Resp: 18 15 17 15   Temp:    97.8 F (36.6 C)  TempSrc:    Oral  SpO2: 96% 96% 98% 97%  Weight:        Constitutional: NAD, calm, comfortable Eyes: lids and conjunctivae normal ENMT: Mucous membranes are moist. Posterior pharynx clear of any exudate or lesions.Normal dentition.  Neck: normal, supple, no masses. Respiratory: Bibasilar inspiratory crackles. Normal respiratory effort. No accessory muscle use.  Cardiovascular: Regular rate and rhythm, no murmurs / rubs / gallops. No extremity edema.  Abdomen: no tenderness, no masses palpated. No hepatosplenomegaly. Bowel sounds positive.  Musculoskeletal: no clubbing / cyanosis. No joint deformity upper and lower extremities. Good ROM, no contractures. Normal muscle tone.  Skin: no rashes, lesions, ulcers. No induration Neurologic: CN 2-12 grossly intact. Sensation intact. Strength 5/5 in all 4.  Psychiatric: Normal judgment and insight. Alert and oriented x 3. Normal mood.     Labs on Admission: I have personally reviewed following labs and imaging studies  CBC: Recent Labs  Lab 05/29/18 1543  WBC 9.0  NEUTROABS 6.6  HGB 13.0  HCT 41.4  MCV 92.0  PLT 227   Basic Metabolic Panel: Recent Labs  Lab 05/29/18 1543  NA 139  K 4.2  CL 103  CO2 24  GLUCOSE 151*  BUN 30*  CREATININE 2.41*  CALCIUM 8.9   GFR: Estimated Creatinine Clearance: 26 mL/min (A) (by C-G formula based on SCr of 2.41 mg/dL (H)). Liver Function Tests: Recent Labs  Lab 05/29/18 1543  AST 17  ALT 18  ALKPHOS 85  BILITOT 0.8  PROT 7.5  ALBUMIN 3.8   No results for input(s): LIPASE, AMYLASE in the  last 168 hours. No results  for input(s): AMMONIA in the last 168 hours. Coagulation Profile: No results for input(s): INR, PROTIME in the last 168 hours. Cardiac Enzymes: No results for input(s): CKTOTAL, CKMB, CKMBINDEX, TROPONINI in the last 168 hours. BNP (last 3 results) No results for input(s): PROBNP in the last 8760 hours. HbA1C: No results for input(s): HGBA1C in the last 72 hours. CBG: No results for input(s): GLUCAP in the last 168 hours. Lipid Profile: No results for input(s): CHOL, HDL, LDLCALC, TRIG, CHOLHDL, LDLDIRECT in the last 72 hours. Thyroid Function Tests: No results for input(s): TSH, T4TOTAL, FREET4, T3FREE, THYROIDAB in the last 72 hours. Anemia Panel: No results for input(s): VITAMINB12, FOLATE, FERRITIN, TIBC, IRON, RETICCTPCT in the last 72 hours. Urine analysis:    Component Value Date/Time   COLORURINE YELLOW 02/04/2018 1540   APPEARANCEUR CLOUDY (A) 02/04/2018 1540   LABSPEC 1.015 02/04/2018 1540   PHURINE 5.0 02/04/2018 1540   GLUCOSEU NEGATIVE 02/04/2018 1540   HGBUR SMALL (A) 02/04/2018 1540   BILIRUBINUR NEGATIVE 02/04/2018 1540   KETONESUR NEGATIVE 02/04/2018 1540   PROTEINUR NEGATIVE 02/04/2018 1540   UROBILINOGEN 0.2 01/22/2015 0340   NITRITE POSITIVE (A) 02/04/2018 1540   LEUKOCYTESUR LARGE (A) 02/04/2018 1540    Radiological Exams on Admission: Dg Chest Port 1 View  Result Date: 05/29/2018 CLINICAL DATA:  Hypotension. EXAM: PORTABLE CHEST 1 VIEW COMPARISON:  02/04/2018 FINDINGS: The heart is enlarged but stable. Mild tortuosity of the thoracic aorta. Stable moderate eventration right hemidiaphragm with some overlying vascular crowding and atelectasis. No infiltrates, edema or effusions. The bony thorax is intact. Stable right shoulder total arthroplasty hardware and stable advanced degenerative changes involving the left shoulder. Spinal cord stimulators are noted in the midthoracic spine area. IMPRESSION: Cardiac enlargement but no acute  pulmonary findings. Electronically Signed   By: Rudie Meyer M.D.   On: 05/29/2018 16:05    EKG: Independently reviewed. Sinus rhythm, first-degree AV block, RBBB and LAFB, no significant change compared to prior  Assessment/Plan Principal Problem:   Acute kidney injury (HCC) Active Problems:   Hypotension   Type 2 diabetes mellitus with complication, without long-term current use of insulin (HCC)   Hyperlipidemia   Chronic combined systolic and diastolic CHF (congestive heart failure) (HCC)   CAD (coronary artery disease)   [name] is a 84 y.o. male with medical history significant for CAD s/p DES (12/2017), T2DM, HTN, CKD Stage III, HLD, Chronic combined systolic and diastolic CHF (EF 16-10%), and blindness who presents to the ED with several days of nonproductive cough, chest congestion, and diarrhea found to be hypotensive with acute on chronic kidney injury.  Acute on chronic kidney injury: Likely prerenal from volume depletion and hypotension secondary to GI losses and home Lasix.  He does report low urine output today therefore will obtain a renal ultrasound. -s/p 3 L NS, gentle IV fluids overnight -Renal ultrasound -Strict I/O's -Recheck renal function in a.m.  Hypotension: Secondary to volume depletion from GI losses as above.  He was responded appropriately to IV fluid resuscitation.  Lactic acid was normal and no obvious sign of systemic infection. -Gentle IV fluids overnight as above -will hold further antibiotics -Hold home amlodipine, Lasix, metoprolol  Chronic combined systolic and diastolic CHF: Appears volume depleted on exam. -Gentle IV fluids as above, strict I/O's -Holding home Lasix and metoprolol  CAD s/p DES: He denies any chest pain. -Continue home aspirin and Brilinta -Continue atorvastatin  Type 2 diabetes: On sliding scale insulin at home. -SSI TIDAC/HS  Hyperlipidemia: -Continue atorvastatin  DVT prophylaxis: subq heparin Code  Status: Full code Family Communication: None present at bedside on admission Disposition Plan: Anticipate discharge to home pending improvement in renal function and stable blood pressure Consults called: None Admission status: Observation   Darreld Mclean MD [facility] Pager [phone]  If 7PM-7AM, please contact night-coverage [email or web address] Password [facility]  05/29/2018, 7:15 PM

## 2018-05-29 NOTE — ED Notes (Signed)
Per md pt may eat: Pt provided with [place] sandwich and diet sprite

## 2018-05-30 ENCOUNTER — Other Ambulatory Visit: Payer: Self-pay

## 2018-05-30 ENCOUNTER — Observation Stay (HOSPITAL_COMMUNITY): Payer: Medicare HMO

## 2018-05-30 ENCOUNTER — Telehealth: Payer: Self-pay | Admitting: Nurse Practitioner

## 2018-05-30 DIAGNOSIS — N179 Acute kidney failure, unspecified: Secondary | ICD-10-CM | POA: Diagnosis not present

## 2018-05-30 LAB — BASIC METABOLIC PANEL
Anion gap: 10 (ref 5–15)
BUN: 24 mg/dL — ABNORMAL HIGH (ref 8–23)
CO2: 21 mmol/L — ABNORMAL LOW (ref 22–32)
Calcium: 8.5 mg/dL — ABNORMAL LOW (ref 8.9–10.3)
Chloride: 110 mmol/L (ref 98–111)
Creatinine, Ser: 1.46 mg/dL — ABNORMAL HIGH (ref 0.61–1.24)
GFR calc Af Amer: 50 mL/min — ABNORMAL LOW (ref >60)
GFR calc non Af Amer: 43 mL/min — ABNORMAL LOW (ref >60)
Glucose, Bld: 88 mg/dL (ref 70–99)
Potassium: 3.7 mmol/L (ref 3.5–5.1)
Sodium: 141 mmol/L (ref 135–145)

## 2018-05-30 LAB — GLUCOSE, CAPILLARY: Glucose-Capillary: 83 mg/dL (ref 70–99)

## 2018-05-30 LAB — CBC
HCT: 38.2 % — ABNORMAL LOW (ref 39.0–52.0)
Hemoglobin: 12 g/dL — ABNORMAL LOW (ref 13.0–17.0)
MCH: 28.2 pg (ref 26.0–34.0)
MCHC: 31.4 g/dL (ref 30.0–36.0)
MCV: 89.9 fL (ref 80.0–100.0)
Platelets: 162 10*3/uL (ref 150–400)
RBC: 4.25 MIL/uL (ref 4.22–5.81)
RDW: 14.6 % (ref 11.5–15.5)
WBC: 6.4 10*3/uL (ref 4.0–10.5)
nRBC: 0 % (ref 0.0–0.2)

## 2018-05-30 NOTE — Care Management Obs Status (Signed)
MEDICARE OBSERVATION STATUS NOTIFICATION   Patient Details  Name: LESTER IMMING MRN: [id number] Date of Birth: [birth year]   Medicare Observation Status Notification Given:  Yes    McGibboneyFelicity Coyer, RN 05/30/2018, 11:15 AM

## 2018-05-30 NOTE — Care Management Obs Status (Signed)
MEDICARE OBSERVATION STATUS NOTIFICATION   Patient Details  Name: CASTIEL MANWILLER MRN: [id number] Date of Birth: [birth year]   Medicare Observation Status Notification Given:  Yes    McGibboneyFelicity Coyer, RN 05/30/2018, 11:17 AM

## 2018-05-30 NOTE — Progress Notes (Addendum)
Writer received call from [facility] lab stating blood cultures 1 of 4 bottles, the aerobic bottle is positive for gram positive rods.  NP on call paged and made aware.

## 2018-05-30 NOTE — Progress Notes (Signed)
Paged by bedside RN that pts BCID results have come back positive for Gram positive rods in the aerobic bottle. Pt was discharged today and several attempts were made to contact the pt with these results. A voicemail and call back number for my direct number, [facility] Main Office, and [employer] were left as well.   Stevie Kern AGPCNP-BC, AGNP-C [facility] Pager [phone]

## 2018-05-30 NOTE — Discharge Summary (Signed)
Physician Discharge Summary  [name] YJW:[id number] DOB: 1930/11/12 DOA: 05/29/2018  PCP: Clayborn Heron, MD  Admit date: 05/29/2018 Discharge date: 05/30/2018  Admitted From: Home  Disposition: Home  Recommendations for Outpatient Follow-up:  1. Follow up with PCP in 1-2 weeks 2. Please obtain BMP/CBC in one week your next doctors visit.  3. Recommend adequate oral hydration  Home Health: PT/RN Discharge Condition: Stable CODE STATUS: Full Diet recommendation: Diabetic/heart healthy  Brief/Interim Summary: 84 year old with history of CAD status post drug-eluting stent 12/2017, diabetes mellitus type 2, CKD stage III, essential hypertension, systolic and diastolic CHF with ejection fraction 40-45% came to the hospital with complaints of dizziness and near syncopal episode.  Patient has had some URI type of symptoms and diarrhea at home which appears to have improved now.  In the hospital his vital signs were relatively unremarkable.  There was concerns of dehydration therefore he was given IV fluids.  The following day felt much better.  His labs showed acute kidney injury where his creatinine had increased from 1.2 which is baseline to 2.4.  With IV fluid the following day came down to 1.4.  Renal ultrasound was relatively unremarkable. Today patient is stable to be discharged with home health.  Discharge Diagnoses:  Principal Problem:   Acute kidney injury (HCC) Active Problems:   Hypotension   Type 2 diabetes mellitus with complication, without long-term current use of insulin (HCC)   Hyperlipidemia   Chronic combined systolic and diastolic CHF (congestive heart failure) (HCC)   CAD (coronary artery disease)  Near syncope secondary to mild to moderate dehydration Hypotension Acute kidney injury, prerenal - This was secondary to poor oral intake while being on Lasix.  Patient was well hydrated during the hospitalization.  After becoming euvolemic his symptoms  greatly improved.  His creatinine improved from 2.4 and dropped down to 1.4.  His baseline is around 1.2.  Patient is tolerating oral diet without any issues. -Recent echocardiogram in June 2019 reviewed-ejection fraction 40%, grade 1 diastolic dysfunction -Advised to maintain adequate oral intake  History of coronary artery disease status post drug-eluting stent in July 2019 Chronic combined systolic and diastolic congestive heart failure, ejection fraction 45% - Patient has been advised to remain compliant with his medications.  Asked to weigh himself daily and follow-up outpatient with cardiology.  He needs to remain consistent with his fluid intake.  Patient is aware of this.  We can resume back his home oral medications. -Currently patient is euvolemic and chest pain-free  Diabetes mellitus type 2 -Resume home meds  Hyperlipidemia -Continue statin  Chronic blindness in the right eye  Patient appears generally weak therefore he should resume his home physical therapy.  He would also benefit from home RN to help with with his symptom management especially in the setting of CHF and advanced cardiac disease.  Patient was on subcu insulin while he was here Full code Discharge today.  Discharge Instructions   Allergies as of 05/30/2018   No Known Allergies     Medication List    TAKE these medications   acetaminophen 325 MG tablet Commonly known as:  TYLENOL Take 2 tablets (650 mg total) by mouth every 6 (six) hours. What changed:    when to take this  reasons to take this   amLODipine 5 MG tablet Commonly known as:  NORVASC Take 1 tablet (5 mg total) by mouth daily.   aspirin 81 MG EC tablet Take 1 tablet (81 mg total) by mouth  daily.   atorvastatin 80 MG tablet Commonly known as:  LIPITOR Take 1 tablet (80 mg total) by mouth daily.   furosemide 20 MG tablet Commonly known as:  LASIX Take 20 mg by mouth daily.   ICY HOT ADVANCED RELIEF 16-11 % Crea Generic  drug:  Menthol-Camphor Apply 1 application topically as needed (knee pain).   insulin aspart 100 UNIT/ML injection Commonly known as:  novoLOG Inject 0-9 Units into the skin 3 (three) times daily with meals.   loperamide 2 MG capsule Commonly known as:  IMODIUM Take 2 mg by mouth as needed for diarrhea or loose stools.   metoprolol succinate 25 MG 24 hr tablet Commonly known as:  TOPROL-XL Take 1 tablet (25 mg total) by mouth daily.   nitroGLYCERIN 0.4 MG SL tablet Commonly known as:  NITROSTAT Place 1 tablet (0.4 mg total) under the tongue every 5 (five) minutes x 3 doses as needed for chest pain.   oxyCODONE 5 MG immediate release tablet Commonly known as:  Oxy IR/ROXICODONE Take 1 tablet (5 mg total) by mouth every 6 (six) hours as needed for severe pain or breakthrough pain.   prednisoLONE acetate 1 % ophthalmic suspension Commonly known as:  PRED FORTE Place 1 drop into the right eye at bedtime.   sertraline 50 MG tablet Commonly known as:  ZOLOFT Take 1 tablet (50 mg total) by mouth at bedtime.   ticagrelor 90 MG Tabs tablet Commonly known as:  BRILINTA Take 1 tablet (90 mg total) by mouth 2 (two) times daily.   timolol 0.5 % ophthalmic solution Commonly known as:  TIMOPTIC Place 1 drop into the right eye daily.   TRAVATAN Z 0.004 % Soln ophthalmic solution Generic drug:  Travoprost (BAK Free) Place 1 drop into the left eye at bedtime.      Follow-up Information    Rankins, Fanny Dance, MD. Schedule an appointment as soon as possible for a visit in 1 week(s).   Specialty:  Family Medicine Contact information: [address] [place] [address] [phone]        Thurmon Fair, MD .   Specialty:  Cardiology Contact information: [address] [place] [address] [phone]          No Known Allergies  You were cared for by a hospitalist during your hospital stay. If you have any questions about your discharge  medications or the care you received while you were in the hospital after you are discharged, you can call the unit and asked to speak with the hospitalist on call if the hospitalist that took care of you is not available. Once you are discharged, your primary care physician will handle any further medical issues. Please note that no refills for any discharge medications will be authorized once you are discharged, as it is imperative that you return to your primary care physician (or establish a relationship with a primary care physician if you do not have one) for your aftercare needs so that they can reassess your need for medications and monitor your lab values.  Consultations:  None   Procedures/Studies: US Renal  Result Date: 05/30/2018 CLINICAL DATA:  Acute on chronic kidney injury. History of renal cysts. EXAM: RENAL / URINARY TRACT ULTRASOUND COMPLETE COMPARISON:  Renal ultrasound, 02/05/2018 FINDINGS: Right Kidney: Renal measurements: 11.1 x 6.4 x 5.0 cm = volume: 185 mL. Borderline increased renal parenchymal echogenicity. Renal cortical thinning. Midpole exophytic cyst measuring 2.1 x 1.9 x 1.8 cm. No other  masses, no stones and no hydronephrosis. Left Kidney: Renal measurements: 9.8 x 5.4 x 5.8 cm = volume: 161 mL. Borderline increased renal parenchymal echogenicity. Renal cortical thinning. Midpole cyst measuring 4.5 x 3.7 x 3.9 cm. Smaller adjacent cyst measuring 1 cm. No stones. No hydronephrosis. Bladder: Appears normal for degree of bladder distention. IMPRESSION: 1. No acute findings.  No hydronephrosis. 2. Borderline increased renal parenchymal echogenicity with bilateral renal cortical thinning consistent with medical renal disease. Bilateral renal cysts. Electronically Signed   By: Amie Portland M.D.   On: 05/30/2018 07:53   Dg Chest Port 1 View  Result Date: 05/29/2018 CLINICAL DATA:  Hypotension. EXAM: PORTABLE CHEST 1 VIEW COMPARISON:  02/04/2018 FINDINGS: The heart is  enlarged but stable. Mild tortuosity of the thoracic aorta. Stable moderate eventration right hemidiaphragm with some overlying vascular crowding and atelectasis. No infiltrates, edema or effusions. The bony thorax is intact. Stable right shoulder total arthroplasty hardware and stable advanced degenerative changes involving the left shoulder. Spinal cord stimulators are noted in the midthoracic spine area. IMPRESSION: Cardiac enlargement but no acute pulmonary findings. Electronically Signed   By: Rudie Meyer M.D.   On: 05/29/2018 16:05      Subjective: Feels better, no complaints this morning.   Discharge Exam: Vitals:   05/29/18 2245 05/30/18 0415  BP: 118/83 123/78  Pulse: 72 79  Resp: 16 18  Temp: 98.3 F (36.8 C) 98.6 F (37 C)  SpO2: 100% 96%   Vitals:   05/29/18 1902 05/29/18 2100 05/29/18 2245 05/30/18 0415  BP: 110/67  118/83 123/78  Pulse: 78  72 79  Resp: 15  16 18   Temp: 97.8 F (36.6 C)  98.3 F (36.8 C) 98.6 F (37 C)  TempSrc: Oral  Oral Oral  SpO2: 97%  100% 96%  Weight:    99.5 kg  Height:  5\' 10"  (1.778 m)      General: Pt is alert, awake, not in acute distress Cardiovascular: RRR, S1/S2 +, no rubs, no gallops Respiratory: CTA bilaterally, no wheezing, no rhonchi Abdominal: Soft, NT, ND, bowel sounds + Extremities: no edema, no cyanosis    The results of significant diagnostics from this hospitalization (including imaging, microbiology, ancillary and laboratory) are listed below for reference.     Microbiology: Recent Results (from the past 240 hour(s))  Blood Culture (routine x 2)     Status: None (Preliminary result)   Collection Time: 05/29/18  4:06 PM  Result Value Ref Range Status   Specimen Description   Final    BLOOD LEFT ANTECUBITAL Performed at [facility], [address] [place], [place] [address]    Special Requests   Final    BOTTLES DRAWN AEROBIC AND ANAEROBIC Blood Culture adequate volume Performed at  [facility], [address] [place], [place] [address]    Culture   Final    NO GROWTH < 24 HOURS Performed at [facility], [address], [place], [place] [address]    Report Status PENDING  Incomplete  Blood Culture (routine x 2)     Status: None (Preliminary result)   Collection Time: 05/29/18  4:06 PM  Result Value Ref Range Status   Specimen Description   Final    BLOOD RIGHT ANTECUBITAL Performed at [facility], [address] [place], [place] [address]    Special Requests   Final    BOTTLES DRAWN AEROBIC AND ANAEROBIC Blood Culture results may not be optimal due  to an excessive volume of blood received in culture bottles Performed at [facility], [address] [place], [place] [address]    Culture   Final    NO GROWTH < 24 HOURS Performed at [facility], [address] [place], [place] [address]    Report Status PENDING  Incomplete     Labs: BNP (last 3 results) Recent Labs    12/08/17 1122 01/23/18 1010  BNP 160.8* 67.8   Basic Metabolic Panel: Recent Labs  Lab 05/29/18 1543 05/30/18 0410  NA 139 141  K 4.2 3.7  CL 103 110  CO2 24 21*  GLUCOSE 151* 88  BUN 30* 24*  CREATININE 2.41* 1.46*  CALCIUM 8.9 8.5*   Liver Function Tests: Recent Labs  Lab 05/29/18 1543  AST 17  ALT 18  ALKPHOS 85  BILITOT 0.8  PROT 7.5  ALBUMIN 3.8   No results for input(s): LIPASE, AMYLASE in the last 168 hours. No results for input(s): AMMONIA in the last 168 hours. CBC: Recent Labs  Lab 05/29/18 1543 05/30/18 0410  WBC 9.0 6.4  NEUTROABS 6.6  --   HGB 13.0 12.0*  HCT 41.4 38.2*  MCV 92.0 89.9  PLT 227 162   Cardiac Enzymes: No results for input(s): CKTOTAL, CKMB, CKMBINDEX, TROPONINI in the last 168 hours. BNP: Invalid input(s): POCBNP CBG: Recent Labs  Lab 05/29/18 2246 05/30/18 0741  GLUCAP 121* 83   D-Dimer No results for input(s): DDIMER in the last 72 hours. Hgb  A1c Recent Labs    05/29/18 1543  HGBA1C 6.1*   Lipid Profile No results for input(s): CHOL, HDL, LDLCALC, TRIG, CHOLHDL, LDLDIRECT in the last 72 hours. Thyroid function studies No results for input(s): TSH, T4TOTAL, T3FREE, THYROIDAB in the last 72 hours.  Invalid input(s): FREET3 Anemia work up No results for input(s): VITAMINB12, FOLATE, FERRITIN, TIBC, IRON, RETICCTPCT in the last 72 hours. Urinalysis    Component Value Date/Time   COLORURINE YELLOW 02/04/2018 1540   APPEARANCEUR CLOUDY (A) 02/04/2018 1540   LABSPEC 1.015 02/04/2018 1540   PHURINE 5.0 02/04/2018 1540   GLUCOSEU NEGATIVE 02/04/2018 1540   HGBUR SMALL (A) 02/04/2018 1540   BILIRUBINUR NEGATIVE 02/04/2018 1540   KETONESUR NEGATIVE 02/04/2018 1540   PROTEINUR NEGATIVE 02/04/2018 1540   UROBILINOGEN 0.2 01/22/2015 0340   NITRITE POSITIVE (A) 02/04/2018 1540   LEUKOCYTESUR LARGE (A) 02/04/2018 1540   Sepsis Labs Invalid input(s): PROCALCITONIN,  WBC,  LACTICIDVEN Microbiology Recent Results (from the past 240 hour(s))  Blood Culture (routine x 2)     Status: None (Preliminary result)   Collection Time: 05/29/18  4:06 PM  Result Value Ref Range Status   Specimen Description   Final    BLOOD LEFT ANTECUBITAL Performed at [facility], [address] [place], [place] [address]    Special Requests   Final    BOTTLES DRAWN AEROBIC AND ANAEROBIC Blood Culture adequate volume Performed at [facility], [address] [place], [place] [address]    Culture   Final    NO GROWTH < 24 HOURS Performed at [facility], [address] [place], [place] [address]    Report Status PENDING  Incomplete  Blood Culture (routine x 2)     Status: None (Preliminary result)   Collection Time: 05/29/18  4:06 PM  Result Value Ref Range Status   Specimen Description   Final    BLOOD RIGHT ANTECUBITAL Performed at [facility], [address] [place],  [place] [address]    Special Requests   Final    BOTTLES DRAWN AEROBIC AND ANAEROBIC Blood Culture results may not be optimal due to an excessive volume of blood received in culture bottles Performed at [facility], [address] [place], [place] [address]    Culture   Final    NO GROWTH < 24 HOURS Performed at [facility], [address] [place], [place] [address]    Report Status PENDING  Incomplete     Time coordinating discharge:  I have spent 35 minutes face to face with the patient and on the ward discussing the patients care, assessment, plan and disposition with other care givers. >50% of the time was devoted counseling the patient about the risks and benefits of treatment/Discharge disposition and coordinating care.   SIGNED:   Dimple Nanas, MD  [facility] 05/30/2018, 12:15 PM Pager   If 7PM-7AM, please contact night-coverage [email or web address] Password [email or web address]

## 2018-06-02 LAB — GLUCOSE, CAPILLARY: Glucose-Capillary: 115 mg/dL — ABNORMAL HIGH (ref 70–99)

## 2018-06-03 LAB — CULTURE, BLOOD (ROUTINE X 2)
Culture: NO GROWTH
Special Requests: ADEQUATE

## 2018-06-15 ENCOUNTER — Other Ambulatory Visit: Payer: Self-pay | Admitting: Adult Health

## 2018-06-15 DIAGNOSIS — F329 Major depressive disorder, single episode, unspecified: Secondary | ICD-10-CM

## 2018-06-15 DIAGNOSIS — F32A Depression, unspecified: Secondary | ICD-10-CM

## 2018-06-30 NOTE — Progress Notes (Addendum)
Cardiology Office Note    Date:  07/02/2018   ID:  Naol, Ontiveros [birth year], MRN [id number]  PCP:  Aretta Nip, MD  Cardiologist: Sanda Klein, MD EPS: None  No chief complaint on file.   History of Present Illness:  [name] is a 85 y.o. male with history of CAD status post MI treated with DES to the OM 2 and DES to the proximal circumflex 11/2017 with staged DES x2 to the RCA.  He has residual 70% mid LAD, 85% distal LAD and 95% small diagonal 1 treated medically.  2D echo LVEF 40 to 45% with mild diastolic dysfunction.  Patient had volume overload requiring IV Lasix as well as an ileus requiring NG tube placement.  Discharged to a skilled nursing facility 11/29/2017.  Readmitted 12/08/2017 with chest pain and had not been taking his Brilinta.  He was reloaded with Brilinta.  Cardiac enzymes are negative x3 and pain spontaneously resolved.  Back in the ER 12/16/2017 with syncope felt secondary to significant diarrhea and likely dehydration and soft blood pressures.  CT of the head was negative.  EKG without acute change.  He was given IV fluids.  Back in the ER 12/19/2017 with knee swelling after tripping and falling and found to have a moderate sized knee effusion.  He was given a knee immobilizer.   I saw the patient 01/21/2018 at which time he was doing well.  He was taking all his medications as prescribed and had no heart failure on exam.  He was readmitted to the hospital 01/23/2018 with syncope after several days of diarrhea and dehydration.  No arrhythmias on telemetry.  Missed last cardiac appointment because of hospitalization for UTI.  Readmitted 05/2018 with URI diarrhea and dizziness with near syncope treated with IV fluids.  Patient comes in today accompanied by his son.  He has been doing very well since December.  No heart failure or cardiac complaints.  Meals on Wheels starts today.  He needs refill on his Lasix.  Was put on a antidepressant by Dr. Zadie Rhine  but ran out of it and now is having trouble sleeping.   Past Medical History:  Diagnosis Date  . Arthritis    "all over" (11/12/2017)  . CAD (coronary artery disease) 11/11/2017   S/p IL STEMI 6/19:  S/p DES to pLCx and DES x 2 to OM2 // staged PCI 6/19:  DES x 2 to RCA // residual CAD at cath 6/19:  mLAD 80, dLAD 90, D1 95 tx medically // EF 40-45 by echo  . Chronic combined systolic and diastolic CHF (congestive heart failure) (HCC)    ischemic CM // s/p MI in 11/2017 >> Echo 6/19:  Moderate concentric LVH, EF 18-29, grade 1 diastolic dysfunction, inferior, inferolateral, apical inferior akinesis   . Chronic lower back pain   . CKD (chronic kidney disease), stage III ([place])   . Constipation    takes an OTC med as needed  . Diabetic nephropathy ([place])   . Gastric ulcer 1970s  . Glaucoma, both eyes    uses eye drops daily  . Gout    takes Allopurinol daily  . History of colon polyps   . History of ST elevation myocardial infarction (STEMI) 11/10/2017   PCI to LCx and staged PCI to RCA  . History of stress test    a. 09/2014 MV: EF 53%, no ischemia/infarct.  . Hyperlipidemia    takes Pravastatin daily  . Hypertension  takes Amlodipine and Losartan daily  . Insomnia    uses OTC sleep aide  . Joint pain   . Joint swelling    right knee   . Legally blind   . Macular degeneration   . Morbid obesity ([place])   . Nocturia   . Peripheral edema   . Pleurisy 20+yrs ago  . Pneumonia    "I think once when I was a kid" (11/12/2017)  . Sleep apnea   . Stroke [facility])    "I've had them; don't know anything about them"; denies residual on 11/12/2017)  . Syncope    a. 09/2014 in setting of orthostasis; b. 10/2014 Event monitor: 19 beat run of asymp NSVT; c. 01/2015 Echo: EF 60-65%, no rwma.  . Type II diabetes mellitus ([place])    takes Metformin daily  . Urinary frequency    takes Flomax daily    Past Surgical History:  Procedure Laterality Date  . BACK SURGERY    . CATARACT EXTRACTION W/  INTRAOCULAR LENS  IMPLANT, BILATERAL Bilateral   . COLONOSCOPY    . CORONARY ANGIOGRAPHY N/A 11/12/2017   Procedure: CORONARY ANGIOGRAPHY;  Surgeon: Troy Sine, MD;  Location: [place] [facility];  Service: Cardiovascular;  Laterality: N/A;  . CORONARY STENT INTERVENTION N/A 11/12/2017   Procedure: CORONARY STENT INTERVENTION;  Surgeon: Troy Sine, MD;  Location: [place] [facility];  Service: Cardiovascular;  Laterality: N/A;  . CORONARY/GRAFT ACUTE MI REVASCULARIZATION N/A 11/10/2017   Procedure: Coronary/Graft Acute MI Revascularization;  Surgeon: Troy Sine, MD;  Location: [place] [facility];  Service: Cardiovascular;  Laterality: N/A;  . EYE SURGERY    . GLAUCOMA SURGERY Bilateral   . INSERTION / PLACEMENT / REVISION NEUROSTIMULATOR  2000s  . KNEE ARTHROSCOPY Right 1990s  . LEFT HEART CATH AND CORONARY ANGIOGRAPHY N/A 11/10/2017   Procedure: LEFT HEART CATH AND CORONARY ANGIOGRAPHY;  Surgeon: Troy Sine, MD;  Location: [place] [facility];  Service: Cardiovascular;  Laterality: N/A;  . MINI SHUNT INSERTION  09/26/2011   Procedure: INSERTION OF MINI SHUNT;  Surgeon: Marylynn Pearson, MD;  Location: [place];  Service: Ophthalmology;  Laterality: Right;  Insertion of Ahmed shunt  . POSTERIOR FUSION LUMBAR SPINE  1990s  . REVERSE SHOULDER ARTHROPLASTY Right 09/21/2013   Procedure: RIGHT  SHOULDER ARTHROPLASTY REVERSE ;  Surgeon: Augustin Schooling, MD;  Location: [place];  Service: Orthopedics;  Laterality: Right;  . SHOULDER INJECTION Left 09/21/2013   Procedure: SHOULDER INJECTION;  Surgeon: Augustin Schooling, MD;  Location: [place];  Service: Orthopedics;  Laterality: Left;    Current Medications: Current Meds  Medication Sig  . acetaminophen (TYLENOL) 325 MG tablet Take 2 tablets (650 mg total) by mouth every 6 (six) hours.  Marland Kitchen amLODipine (NORVASC) 5 MG tablet Take 1 tablet (5 mg total) by mouth daily.  Marland Kitchen aspirin EC 81 MG EC tablet Take 1 tablet (81 mg total) by mouth daily.  Marland Kitchen atorvastatin  (LIPITOR) 80 MG tablet Take 1 tablet (80 mg total) by mouth daily.  . furosemide (LASIX) 20 MG tablet Take 20 mg by mouth daily.  . insulin aspart (NOVOLOG) 100 UNIT/ML injection Inject 0-9 Units into the skin 3 (three) times daily with meals.  Marland Kitchen loperamide (IMODIUM) 2 MG capsule Take 2 mg by mouth as needed for diarrhea or loose stools.  . Menthol-Camphor (ICY HOT ADVANCED RELIEF) 16-11 % CREA Apply 1 application topically as needed (knee pain).  . metoprolol succinate (TOPROL-XL) 25 MG  24 hr tablet Take 1 tablet (25 mg total) by mouth daily.  . nitroGLYCERIN (NITROSTAT) 0.4 MG SL tablet Place 1 tablet (0.4 mg total) under the tongue every 5 (five) minutes x 3 doses as needed for chest pain.  Marland Kitchen oxyCODONE (OXY IR/ROXICODONE) 5 MG immediate release tablet Take 1 tablet (5 mg total) by mouth every 6 (six) hours as needed for severe pain or breakthrough pain.  . prednisoLONE acetate (PRED FORTE) 1 % ophthalmic suspension Place 1 drop into the right eye at bedtime.  . sertraline (ZOLOFT) 50 MG tablet Take 1 tablet (50 mg total) by mouth at bedtime.  . ticagrelor (BRILINTA) 90 MG TABS tablet Take 1 tablet (90 mg total) by mouth 2 (two) times daily.  . timolol (TIMOPTIC) 0.5 % ophthalmic solution Place 1 drop into the right eye daily.  . TRAVATAN Z 0.004 % SOLN ophthalmic solution Place 1 drop into the left eye at bedtime.     Allergies:   Patient has no known allergies.   Social History   Socioeconomic History  . Marital status: Widowed    Spouse name: Not on file  . Number of children: Not on file  . Years of education: Not on file  . Highest education level: Not on file  Occupational History  . Not on file  Social Needs  . Financial resource strain: Somewhat hard  . Food insecurity:    Worry: Patient refused    Inability: Patient refused  . Transportation needs:    Medical: Patient refused    Non-medical: Patient refused  Tobacco Use  . Smoking status: Former Smoker    Packs/day:  2.00    Types: Cigarettes  . Smokeless tobacco: Never Used  . Tobacco comment: 11/12/2017 "nothing since the 1980s"  Substance and Sexual Activity  . Alcohol use: Not Currently    Comment: 11/12/2017 "nothing since the 1980s"  . Drug use: Never  . Sexual activity: Not Currently  Lifestyle  . Physical activity:    Days per week: Patient refused    Minutes per session: Patient refused  . Stress: Only a little  Relationships  . Social connections:    Talks on phone: Patient refused    Gets together: Patient refused    Attends religious service: Patient refused    Active member of club or organization: Patient refused    Attends meetings of clubs or organizations: Patient refused    Relationship status: Patient refused  Other Topics Concern  . Not on file  Social History Narrative   Legally blind.     Family History:  The patient's family history includes Alzheimer's disease in his maternal aunt; Cancer in his mother.   ROS:   Please see the history of present illness.    Review of Systems  Constitution: Positive for malaise/fatigue.  Musculoskeletal: Positive for back pain.  Psychiatric/Behavioral: The patient has insomnia.    All other systems reviewed and are negative.   PHYSICAL EXAM:   VS:  BP 126/70   Pulse 78   Ht 5' 10"  (1.778 m)   Wt 227 lb 12.8 oz (103.3 kg)   SpO2 98%   BMI 32.69 kg/m   Physical Exam  GEN: Obese, in no acute distress  Neck: no JVD, carotid bruits, or masses Cardiac:RRR; positive S4 and 2/6 systolic murmur at the left sternal border Respiratory:  clear to auscultation bilaterally, normal work of breathing GI: soft, nontender, nondistended, + BS Ext: without cyanosis, clubbing, or edema, Good distal pulses bilaterally  Neuro:  Alert and Oriented x 3 Psych: euthymic mood, full affect  Wt Readings from Last 3 Encounters:  07/02/18 227 lb 12.8 oz (103.3 kg)  05/30/18 219 lb 6.4 oz (99.5 kg)  02/04/18 211 lb (95.7 kg)      Studies/Labs  Reviewed:   EKG:  EKG is not ordered today.    Recent Labs: 01/23/2018: B Natriuretic Peptide 67.8; Magnesium 2.0 05/29/2018: ALT 18 05/30/2018: BUN 24; Creatinine, Ser 1.46; Hemoglobin 12.0; Platelets 162; Potassium 3.7; Sodium 141   Lipid Panel    Component Value Date/Time   CHOL 138 11/11/2017 0217   TRIG 72 11/11/2017 0217   HDL 36 (L) 11/11/2017 0217   CHOLHDL 3.8 11/11/2017 0217   VLDL 14 11/11/2017 0217   LDLCALC 88 11/11/2017 0217    Additional studies/ records that were reviewed today include:     Echocardiogram 11/11/2017 Moderate concentric LVH, EF 11-91, grade 1 diastolic dysfunction, inferior, inferolateral, apical inferior akinesis.   Cardiac catheterization 11/10/2017 LAD mid 80, distal 90; D1 95 LCx proximal 90; OM 2 100, 95 RCA mid 95, distal 80 PCI: 2 x 12 mm resolute Onyx DES distal OM2; 2.5 x 18 mm resolute DES to the OM2; 3 x 18 mm resolute DES to the proximal LCx    ASSESSMENT:    1. Coronary artery disease involving native coronary artery of native heart without angina pectoris   2. Chronic combined systolic and diastolic CHF (congestive heart failure) ([place])   3. Ischemic cardiomyopathy   4. Syncope and collapse   5. HTN (hypertension), benign   6. Stage III chronic kidney disease ([place])   7. Hyperlipidemia, unspecified hyperlipidemia type      PLAN:  In order of problems listed above:  CAD status post MI treated with DES to OM 2 in proximal circumflex 11/2017 with staged DES to the RCA.  Residual 70% mid LAD and 85% distal LAD 95% small diagonal 1 treated medically.  LVEF 40 to 45%.  Was readmitted with chest pain after not taking his Brilinta and was reloaded with Brilinta.  Patient now taking all his medications and doing well.  Follow-up with Dr. Sallyanne Kuster in 2 months  Chronic combined systolic and diastolic CHF well compensated.  Refill Lasix check be met today.  Ischemic cardiomyopathy EF 40 to 45%  History of syncope and collapse 12/2017,  01/2018, 05/2018 all felt secondary to diarrhea and dehydration responded to IV fluids.  No arrhythmias on telemetry-no further problems.  Essential hypertension blood pressure well controlled  CKD stage III checking renal function today.  Hyperlipidemia LDL 88 11/2017 on Lipitor 80 mg.  Will need repeat lipids in June    Medication Adjustments/Labs and Tests Ordered: Current medicines are reviewed at length with the patient today.  Concerns regarding medicines are outlined above.  Medication changes, Labs and Tests ordered today are listed in the Patient Instructions below. Patient Instructions  Medication Instructions:  Your physician recommends that you continue on your current medications as directed. Please refer to the Current Medication list given to you today.  If you need a refill on your cardiac medications before your next appointment, please call your pharmacy.   Lab work: None ordered  If you have labs (blood work) drawn today and your tests are completely normal, you will receive your results only by: Marland Kitchen MyChart Message (if you have MyChart) OR . A paper copy in the mail If you have any lab test that is abnormal or we need to change your  treatment, we will call you to review the results.  Testing/Procedures: None ordered  Follow-Up: Your physician recommends that you schedule a follow-up appointment in:    Any Other Special Instructions Will Be Listed Below (If Applicable).        Sumner Boast, PA-C  07/02/2018 8:12 AM    [facility] [address], [place], [place]  [address] Phone: [phone]; Fax: [phone]

## 2018-07-02 ENCOUNTER — Ambulatory Visit (INDEPENDENT_AMBULATORY_CARE_PROVIDER_SITE_OTHER): Payer: Medicare HMO | Admitting: Physician Assistant

## 2018-07-02 ENCOUNTER — Encounter: Payer: Self-pay | Admitting: Physician Assistant

## 2018-07-02 ENCOUNTER — Encounter (INDEPENDENT_AMBULATORY_CARE_PROVIDER_SITE_OTHER): Payer: Self-pay

## 2018-07-02 VITALS — BP 126/70 | HR 78 | Ht 70.0 in | Wt 227.8 lb

## 2018-07-02 DIAGNOSIS — I255 Ischemic cardiomyopathy: Secondary | ICD-10-CM | POA: Diagnosis not present

## 2018-07-02 DIAGNOSIS — R55 Syncope and collapse: Secondary | ICD-10-CM

## 2018-07-02 DIAGNOSIS — I2510 Atherosclerotic heart disease of native coronary artery without angina pectoris: Secondary | ICD-10-CM

## 2018-07-02 DIAGNOSIS — N183 Chronic kidney disease, stage 3 unspecified: Secondary | ICD-10-CM

## 2018-07-02 DIAGNOSIS — I5042 Chronic combined systolic (congestive) and diastolic (congestive) heart failure: Secondary | ICD-10-CM | POA: Diagnosis not present

## 2018-07-02 DIAGNOSIS — E785 Hyperlipidemia, unspecified: Secondary | ICD-10-CM

## 2018-07-02 DIAGNOSIS — I10 Essential (primary) hypertension: Secondary | ICD-10-CM

## 2018-07-02 LAB — BASIC METABOLIC PANEL
BUN/Creatinine Ratio: 17 (ref 10–24)
BUN: 20 mg/dL (ref 8–27)
CO2: 23 mmol/L (ref 20–29)
Calcium: 9.5 mg/dL (ref 8.6–10.2)
Chloride: 101 mmol/L (ref 96–106)
Creatinine, Ser: 1.21 mg/dL (ref 0.76–1.27)
GFR calc Af Amer: 63 mL/min/{1.73_m2} (ref >59)
GFR calc non Af Amer: 54 mL/min/{1.73_m2} — ABNORMAL LOW (ref >59)
Glucose: 133 mg/dL — ABNORMAL HIGH (ref 65–99)
Potassium: 3.8 mmol/L (ref 3.5–5.2)
Sodium: 139 mmol/L (ref 134–144)

## 2018-07-02 MED ORDER — FUROSEMIDE 20 MG PO TABS: 20.0000 mg | ORAL_TABLET | Freq: Every day | ORAL | 6 refills | Status: DC

## 2018-07-02 NOTE — Patient Instructions (Addendum)
Medication Instructions:  Your physician recommends that you continue on your current medications as directed. Please refer to the Current Medication list given to you today.  If you need a refill on your cardiac medications before your next appointment, please call your pharmacy.   Lab work: TODAY:  BMET  If you have labs (blood work) drawn today and your tests are completely normal, you will receive your results only by: Marland Kitchen MyChart Message (if you have MyChart) OR . A paper copy in the mail If you have any lab test that is abnormal or we need to change your treatment, we will call you to review the results.  Testing/Procedures: None ordered  Follow-Up: Your physician recommends that you schedule a follow-up appointment in:  10/10/2018 8:00 WITH DR. Royann Shivers  Any Other Special Instructions Will Be Listed Below (If Applicable).

## 2018-07-05 ENCOUNTER — Other Ambulatory Visit: Payer: Self-pay | Admitting: Adult Health

## 2018-07-05 DIAGNOSIS — F32A Depression, unspecified: Secondary | ICD-10-CM

## 2018-07-05 DIAGNOSIS — F329 Major depressive disorder, single episode, unspecified: Secondary | ICD-10-CM

## 2018-07-15 ENCOUNTER — Other Ambulatory Visit: Payer: Self-pay | Admitting: Adult Health

## 2018-07-15 DIAGNOSIS — F329 Major depressive disorder, single episode, unspecified: Secondary | ICD-10-CM

## 2018-07-15 DIAGNOSIS — F32A Depression, unspecified: Secondary | ICD-10-CM

## 2018-07-31 ENCOUNTER — Other Ambulatory Visit: Payer: Self-pay | Admitting: Cardiology

## 2018-07-31 DIAGNOSIS — I213 ST elevation (STEMI) myocardial infarction of unspecified site: Secondary | ICD-10-CM

## 2018-07-31 DIAGNOSIS — I5043 Acute on chronic combined systolic (congestive) and diastolic (congestive) heart failure: Secondary | ICD-10-CM

## 2018-07-31 DIAGNOSIS — I10 Essential (primary) hypertension: Secondary | ICD-10-CM

## 2018-07-31 DIAGNOSIS — E782 Mixed hyperlipidemia: Secondary | ICD-10-CM

## 2018-07-31 NOTE — Telephone Encounter (Signed)
Rx(s) sent to pharmacy electronically.  

## 2018-09-30 ENCOUNTER — Telehealth: Payer: Self-pay | Admitting: Cardiovascular Disease

## 2018-09-30 NOTE — Telephone Encounter (Signed)
LVM to schedule either video or phone visit.

## 2018-09-30 NOTE — Telephone Encounter (Signed)
Phone just rang then hung up.

## 2018-10-09 ENCOUNTER — Telehealth: Payer: Self-pay

## 2018-10-09 NOTE — Telephone Encounter (Signed)
Attempted to contact patient to review consent information for his upcoming appointment with Dr Royann Shivers.  [email or web address]  If he returns call, please review the consent information (dot phrase = [email or web address]) with him while you have him on the phone. Thanks!!

## 2018-10-10 ENCOUNTER — Ambulatory Visit: Payer: Medicare HMO | Admitting: Cardiovascular Disease

## 2018-11-01 DIAGNOSIS — M79609 Pain in unspecified limb: Secondary | ICD-10-CM | POA: Diagnosis not present

## 2018-11-01 DIAGNOSIS — G4733 Obstructive sleep apnea (adult) (pediatric): Secondary | ICD-10-CM | POA: Diagnosis not present

## 2018-11-01 DIAGNOSIS — R972 Elevated prostate specific antigen [PSA]: Secondary | ICD-10-CM | POA: Diagnosis not present

## 2018-11-01 DIAGNOSIS — I5032 Chronic diastolic (congestive) heart failure: Secondary | ICD-10-CM | POA: Diagnosis not present

## 2018-11-03 DIAGNOSIS — I509 Heart failure, unspecified: Secondary | ICD-10-CM | POA: Diagnosis not present

## 2018-11-03 DIAGNOSIS — E261 Secondary hyperaldosteronism: Secondary | ICD-10-CM | POA: Diagnosis not present

## 2018-12-02 DIAGNOSIS — M79609 Pain in unspecified limb: Secondary | ICD-10-CM | POA: Diagnosis not present

## 2018-12-02 DIAGNOSIS — G4733 Obstructive sleep apnea (adult) (pediatric): Secondary | ICD-10-CM | POA: Diagnosis not present

## 2018-12-02 DIAGNOSIS — R972 Elevated prostate specific antigen [PSA]: Secondary | ICD-10-CM | POA: Diagnosis not present

## 2018-12-02 DIAGNOSIS — I5032 Chronic diastolic (congestive) heart failure: Secondary | ICD-10-CM | POA: Diagnosis not present

## 2018-12-04 ENCOUNTER — Other Ambulatory Visit: Payer: Self-pay | Admitting: Adult Health

## 2018-12-08 ENCOUNTER — Other Ambulatory Visit: Payer: Self-pay | Admitting: Adult Health

## 2018-12-09 DIAGNOSIS — M1711 Unilateral primary osteoarthritis, right knee: Secondary | ICD-10-CM | POA: Diagnosis not present

## 2018-12-09 DIAGNOSIS — Z9989 Dependence on other enabling machines and devices: Secondary | ICD-10-CM | POA: Diagnosis not present

## 2018-12-13 ENCOUNTER — Other Ambulatory Visit: Payer: Self-pay | Admitting: Cardiology

## 2018-12-13 DIAGNOSIS — I213 ST elevation (STEMI) myocardial infarction of unspecified site: Secondary | ICD-10-CM

## 2018-12-17 NOTE — Telephone Encounter (Signed)
This is Dr. Croitoru's pt 

## 2019-01-01 DIAGNOSIS — G4733 Obstructive sleep apnea (adult) (pediatric): Secondary | ICD-10-CM | POA: Diagnosis not present

## 2019-01-01 DIAGNOSIS — R972 Elevated prostate specific antigen [PSA]: Secondary | ICD-10-CM | POA: Diagnosis not present

## 2019-01-01 DIAGNOSIS — M79609 Pain in unspecified limb: Secondary | ICD-10-CM | POA: Diagnosis not present

## 2019-01-01 DIAGNOSIS — I5032 Chronic diastolic (congestive) heart failure: Secondary | ICD-10-CM | POA: Diagnosis not present

## 2019-01-04 ENCOUNTER — Other Ambulatory Visit: Payer: Self-pay | Admitting: Adult Health

## 2019-02-10 ENCOUNTER — Encounter: Payer: Self-pay | Admitting: Cardiovascular Disease

## 2019-02-10 ENCOUNTER — Other Ambulatory Visit: Payer: Self-pay

## 2019-02-10 ENCOUNTER — Ambulatory Visit (INDEPENDENT_AMBULATORY_CARE_PROVIDER_SITE_OTHER): Payer: Medicare HMO | Admitting: Cardiovascular Disease

## 2019-02-10 VITALS — BP 128/77 | HR 86 | Ht 70.0 in | Wt 241.0 lb

## 2019-02-10 DIAGNOSIS — I453 Trifascicular block: Secondary | ICD-10-CM | POA: Diagnosis not present

## 2019-02-10 DIAGNOSIS — E1169 Type 2 diabetes mellitus with other specified complication: Secondary | ICD-10-CM

## 2019-02-10 DIAGNOSIS — E7800 Pure hypercholesterolemia, unspecified: Secondary | ICD-10-CM

## 2019-02-10 DIAGNOSIS — E669 Obesity, unspecified: Secondary | ICD-10-CM | POA: Diagnosis not present

## 2019-02-10 DIAGNOSIS — I10 Essential (primary) hypertension: Secondary | ICD-10-CM | POA: Diagnosis not present

## 2019-02-10 DIAGNOSIS — I2510 Atherosclerotic heart disease of native coronary artery without angina pectoris: Secondary | ICD-10-CM | POA: Diagnosis not present

## 2019-02-10 DIAGNOSIS — N183 Chronic kidney disease, stage 3 unspecified: Secondary | ICD-10-CM

## 2019-02-10 DIAGNOSIS — I5042 Chronic combined systolic (congestive) and diastolic (congestive) heart failure: Secondary | ICD-10-CM | POA: Diagnosis not present

## 2019-02-10 MED ORDER — FUROSEMIDE 20 MG PO TABS: 20.0000 mg | ORAL_TABLET | Freq: Every day | ORAL | 6 refills | Status: DC

## 2019-02-10 NOTE — Patient Instructions (Signed)
Medication Instructions:  STOP the Brilinta If you need a refill on your cardiac medications before your next appointment, please call your pharmacy.   Lab work: Your provider would like for you to have the following labs today: lipid, A1C, and CMET  If you have labs (blood work) drawn today and your tests are completely normal, you will receive your results only by: Marland Kitchen MyChart Message (if you have MyChart) OR . A paper copy in the mail If you have any lab test that is abnormal or we need to change your treatment, we will call you to review the results.  Testing/Procedures: None ordered  Follow-Up: At [facility], you and your health needs are our priority.  As part of our continuing mission to provide you with exceptional heart care, we have created designated Provider Care Teams.  These Care Teams include your primary Cardiologist (physician) and Advanced Practice Providers (APPs -  Physician Assistants and Nurse Practitioners) who all work together to provide you with the care you need, when you need it. You will need a follow up appointment in 6 months for a virtual visit.  Please call our office 2 months in advance to schedule this appointment.  You may see Sanda Klein, MD or one of the following Advanced Practice Providers on your designated Care Team: [place], [place] . Fabian Sharp, PA-C

## 2019-02-10 NOTE — Progress Notes (Signed)
Cardiology Office Note:    Date:  02/10/2019   ID:  [name], DOB August 07, 1930, MRN [id number]  PCP:  Clayborn Heronankins, Victoria R, MD  Cardiologist:  Thurmon FairMihai Hazelgrace Bonham, MD  Electrophysiologist:  None   Referring MD: Clayborn Heronankins, Victoria R, MD   Chief Complaint  Patient presents with   Other    Patient c/o swelling in ankles. Patient denies chest pain and SOB at this time. Meds reviewed verbally with patient.     History of Present Illness:    [name] is a 85 y.o. male with a hx of chronic combined systolic and diastolic heart failure (LVEF 40-45%) following inferolateral myocardial infarction in June 2019 (DES to OM 2, DES to proximal circumflex, DES x2 to the RCA, residual 70% mid LAD, 85% distal LAD, 95% small diagonal), trifascicular block, history of syncope, type II diabetes complicated by nephropathy, hypercholesterolemia extensive problems with degenerative joint disease involving his knees and lower back, essential hypertension.  He has recently run out of his furosemide and developed some mild ankle swelling bilaterally.  He has not had orthopnea, PND or even exertional dyspnea, but is rather sedentary.  He denies angina pectoris at rest or with activity.  He has not had palpitations or syncope.  He denies focal neurological events.  Interestingly, prior to a June 2019 myocardial infarction he had about 6 episodes of unexplained syncope lasting for 2 or 3 minutes at a time, during which he would be completely unresponsive, witnessed by his son.  These have not happened since he had the  stent placed.  Past Medical History:  Diagnosis Date   Arthritis    "all over" (11/12/2017)   CAD (coronary artery disease) 11/11/2017   S/p IL STEMI 6/19:  S/p DES to pLCx and DES x 2 to OM2 // staged PCI 6/19:  DES x 2 to RCA // residual CAD at cath 6/19:  mLAD 80, dLAD 90, D1 95 tx medically // EF 40-45 by echo   Chronic combined systolic and diastolic CHF (congestive heart failure)  (HCC)    ischemic CM // s/p MI in 11/2017 >> Echo 6/19:  Moderate concentric LVH, EF 40-45, grade 1 diastolic dysfunction, inferior, inferolateral, apical inferior akinesis    Chronic lower back pain    CKD (chronic kidney disease), stage III (HCC)    Constipation    takes an OTC med as needed   Diabetic nephropathy (HCC)    Gastric ulcer 1970s   Glaucoma, both eyes    uses eye drops daily   Gout    takes Allopurinol daily   History of colon polyps    History of ST elevation myocardial infarction (STEMI) 11/10/2017   PCI to LCx and staged PCI to RCA   History of stress test    a. 09/2014 MV: EF 53%, no ischemia/infarct.   Hyperlipidemia    takes Pravastatin daily   Hypertension    takes Amlodipine and Losartan daily   Insomnia    uses OTC sleep aide   Joint pain    Joint swelling    right knee    Legally blind    Macular degeneration    Morbid obesity (HCC)    Nocturia    Peripheral edema    Pleurisy 20+yrs ago   Pneumonia    "I think once when I was a kid" (11/12/2017)   Sleep apnea    Stroke (HCC)    "I've had them; don't know anything about them"; denies residual on 11/12/2017)  Syncope    a. 09/2014 in setting of orthostasis; b. 10/2014 Event monitor: 19 beat run of asymp NSVT; c. 01/2015 Echo: EF 60-65%, no rwma.   Type II diabetes mellitus (HCC)    takes Metformin daily   Urinary frequency    takes Flomax daily    Past Surgical History:  Procedure Laterality Date   BACK SURGERY     CATARACT EXTRACTION W/ INTRAOCULAR LENS  IMPLANT, BILATERAL Bilateral    COLONOSCOPY     CORONARY ANGIOGRAPHY N/A 11/12/2017   Procedure: CORONARY ANGIOGRAPHY;  Surgeon: Lennette BihariKelly, Thomas A, MD;  Location: [facility];  Service: Cardiovascular;  Laterality: N/A;   CORONARY STENT INTERVENTION N/A 11/12/2017   Procedure: CORONARY STENT INTERVENTION;  Surgeon: Lennette BihariKelly, Thomas A, MD;  Location: [facility];  Service: Cardiovascular;  Laterality: N/A;     CORONARY/GRAFT ACUTE MI REVASCULARIZATION N/A 11/10/2017   Procedure: Coronary/Graft Acute MI Revascularization;  Surgeon: Lennette BihariKelly, Thomas A, MD;  Location: [facility];  Service: Cardiovascular;  Laterality: N/A;   EYE SURGERY     GLAUCOMA SURGERY Bilateral    INSERTION / PLACEMENT / REVISION NEUROSTIMULATOR  2000s   KNEE ARTHROSCOPY Right 1990s   LEFT HEART CATH AND CORONARY ANGIOGRAPHY N/A 11/10/2017   Procedure: LEFT HEART CATH AND CORONARY ANGIOGRAPHY;  Surgeon: Lennette BihariKelly, Thomas A, MD;  Location: [facility];  Service: Cardiovascular;  Laterality: N/A;   MINI SHUNT INSERTION  09/26/2011   Procedure: INSERTION OF MINI SHUNT;  Surgeon: Chalmers Guestoy Whitaker, MD;  [facility];  Service: Ophthalmology;  Laterality: Right;  Insertion of Ahmed shunt   POSTERIOR FUSION LUMBAR SPINE  1990s   REVERSE SHOULDER ARTHROPLASTY Right 09/21/2013   Procedure: RIGHT  SHOULDER ARTHROPLASTY REVERSE ;  Surgeon: Verlee RossettiSteven R Norris, MD;  [facility];  Service: Orthopedics;  Laterality: Right;   SHOULDER INJECTION Left 09/21/2013   Procedure: SHOULDER INJECTION;  Surgeon: Verlee RossettiSteven R Norris, MD;  [facility];  Service: Orthopedics;  Laterality: Left;    Current Medications: Current Meds  Medication Sig   acetaminophen (TYLENOL) 325 MG tablet Take 2 tablets (650 mg total) by mouth every 6 (six) hours.   amLODipine (NORVASC) 5 MG tablet Take 1 tablet (5 mg total) by mouth daily.   aspirin EC 81 MG EC tablet Take 1 tablet (81 mg total) by mouth daily.   atorvastatin (LIPITOR) 80 MG tablet Take 1 tablet (80 mg total) by mouth daily.   furosemide (LASIX) 20 MG tablet Take 1 tablet (20 mg total) by mouth daily.   insulin aspart (NOVOLOG) 100 UNIT/ML injection Inject 0-9 Units into the skin 3 (three) times daily with meals.   loperamide (IMODIUM) 2 MG capsule Take 2 mg by mouth as needed for diarrhea or loose stools.   Menthol-Camphor (ICY HOT ADVANCED RELIEF) 16-11 % CREA Apply 1 application  topically as needed (knee pain).   metoprolol succinate (TOPROL-XL) 25 MG 24 hr tablet Take 1 tablet (25 mg total) by mouth daily.   nitroGLYCERIN (NITROSTAT) 0.4 MG SL tablet Place 1 tablet (0.4 mg total) under the tongue every 5 (five) minutes x 3 doses as needed for chest pain.   oxyCODONE (OXY IR/ROXICODONE) 5 MG immediate release tablet Take 1 tablet (5 mg total) by mouth every 6 (six) hours as needed for severe pain or breakthrough pain.   prednisoLONE acetate (PRED FORTE) 1 % ophthalmic suspension Place 1 drop into the right eye at bedtime.   sertraline (ZOLOFT) 50 MG tablet  Take 1 tablet (50 mg total) by mouth at bedtime.   timolol (TIMOPTIC) 0.5 % ophthalmic solution Place 1 drop into the right eye daily.   TRAVATAN Z 0.004 % SOLN ophthalmic solution Place 1 drop into the left eye at bedtime.   [DISCONTINUED] furosemide (LASIX) 20 MG tablet Take 1 tablet (20 mg total) by mouth daily.   [DISCONTINUED] ticagrelor (BRILINTA) 90 MG TABS tablet Take 1 tablet (90 mg total) by mouth 2 (two) times daily. Please schedule follow up appointment     Allergies:   Patient has no known allergies.   Social History   Socioeconomic History   Marital status: Widowed    Spouse name: Not on file   Number of children: Not on file   Years of education: Not on file   Highest education level: Not on file  Occupational History   Not on file  Social Needs   Financial resource strain: Somewhat hard   Food insecurity    Worry: Patient refused    Inability: Patient refused   Transportation needs    Medical: Patient refused    Non-medical: Patient refused  Tobacco Use   Smoking status: Former Smoker    Packs/day: 2.00    Types: Cigarettes   Smokeless tobacco: Never Used   Tobacco comment: 11/12/2017 "nothing since the 1980s"  Substance and Sexual Activity   Alcohol use: Not Currently    Comment: 11/12/2017 "nothing since the 1980s"   Drug use: Never   Sexual activity: Not  Currently  Lifestyle   Physical activity    Days per week: Patient refused    Minutes per session: Patient refused   Stress: Only a little  Relationships   Social connections    Talks on phone: Patient refused    Gets together: Patient refused    Attends religious service: Patient refused    Active member of club or organization: Patient refused    Attends meetings of clubs or organizations: Patient refused    Relationship status: Patient refused  Other Topics Concern   Not on file  Social History Narrative   Legally blind.     Family History: The patient's family history includes Alzheimer's disease in his maternal aunt; Cancer in his mother. There is no history of Anesthesia problems.  ROS:   Please see the history of present illness.     All other systems reviewed and are negative.  EKGs/Labs/Other Studies Reviewed:    The following studies were reviewed today: Coronary angiography and echocardiogram June 2019  EKG:  EKG is ordered today.  The ekg ordered today demonstrates sinus rhythm with first-degree AV block, right bundle branch block, left anterior fascicular block (bifascicular block), no ischemic repolarization abnormalities, QTC 478 ms.  Recent Labs: 05/29/2018: ALT 18 05/30/2018: Hemoglobin 12.0; Platelets 162 07/02/2018: BUN 20; Creatinine, Ser 1.21; Potassium 3.8; Sodium 139  Recent Lipid Panel    Component Value Date/Time   CHOL 138 11/11/2017 0217   TRIG 72 11/11/2017 0217   HDL 36 (L) 11/11/2017 0217   CHOLHDL 3.8 11/11/2017 0217   VLDL 14 11/11/2017 0217   LDLCALC 88 11/11/2017 0217    Physical Exam:    VS:  BP 128/77 (BP Location: Right Arm, Patient Position: Sitting, Cuff Size: Normal)    Pulse 86    Ht 5\' 10"  (1.778 m)    Wt 241 lb (109.3 kg)    BMI 34.58 kg/m     Wt Readings from Last 3 Encounters:  02/10/19 241 lb (109.3  kg)  07/02/18 227 lb 12.8 oz (103.3 kg)  05/30/18 219 lb 6.4 oz (99.5 kg)     GEN: Morbidly obese, well  nourished, well developed in no acute distress HEENT: Normal NECK: No JVD; No carotid bruits LYMPHATICS: No lymphadenopathy CARDIAC: RRR, no murmurs, rubs, gallops RESPIRATORY:  Clear to auscultation without rales, wheezing or rhonchi  ABDOMEN: Soft, non-tender, non-distended MUSCULOSKELETAL: 1+ symmetrical ankle edema; No deformity  SKIN: Warm and dry  NEUROLOGIC:  Alert and oriented x 3 PSYCHIATRIC:  Normal affect   ASSESSMENT:    1. Chronic combined systolic and diastolic CHF (congestive heart failure) (HCC)   2. Coronary artery disease involving native coronary artery of native heart without angina pectoris   3. Pure hypercholesterolemia   4. Essential hypertension   5. CKD (chronic kidney disease) stage 3, GFR 30-59 ml/min (HCC)   6. Diabetes mellitus type 2 in obese (HCC)   7. Trifascicular block    PLAN:    In order of problems listed above:  1. CHF: Very mild ankle swelling without any other signs of hypervolemia or symptoms of congestive heart failure.  The ankle swelling could be just a side effect from amlodipine.  We will refill his low-dose of loop diuretic. 2. CAD: No angina.  On aspirin and statin and beta-blocker.  We will stop the Brilinta. 3. HLP: Still on high-dose atorvastatin.  Will check a lipid profile today (he is not fasting but does not want to leave the house due to the coronavirus pandemic). 4. HTN: Adequate control. 5. CKD 2-3: Recheck labs today. 6. DM: Reports good glycemic control.  Most recent hemoglobin A1c was 6% in December. 7. Trifascicular block: Interestingly history of syncope.  Wonder whether he would have transient complete AV block related to discontinue the RCA-circumflex territory.  If he should have syncope again we will strongly consider the need for a dual-chamber permanent pacemaker implantation.   Medication Adjustments/Labs and Tests Ordered: Current medicines are reviewed at length with the patient today.  Concerns regarding  medicines are outlined above.  Orders Placed This Encounter  Procedures   Lipid panel   Hemoglobin A1c   Comprehensive metabolic panel   EKG 12-Lead   Meds ordered this encounter  Medications   furosemide (LASIX) 20 MG tablet    Sig: Take 1 tablet (20 mg total) by mouth daily.    Dispense:  30 tablet    Refill:  6    Patient Instructions  Medication Instructions:  STOP the Brilinta If you need a refill on your cardiac medications before your next appointment, please call your pharmacy.   Lab work: Your provider would like for you to have the following labs today: lipid, A1C, and CMET  If you have labs (blood work) drawn today and your tests are completely normal, you will receive your results only by:  MyChart Message (if you have MyChart) OR  A paper copy in the mail If you have any lab test that is abnormal or we need to change your treatment, we will call you to review the results.  Testing/Procedures: None ordered  Follow-Up: At [facility], you and your health needs are our priority.  As part of our continuing mission to provide you with exceptional heart care, we have created designated Provider Care Teams.  These Care Teams include your primary Cardiologist (physician) and Advanced Practice Providers (APPs -  Physician Assistants and Nurse Practitioners) who all work together to provide you with the care you need, when you  need it. You will need a follow up appointment in 6 months for a virtual visit.  Please call our office 2 months in advance to schedule this appointment.  You may see Thurmon FairMihai Amyriah Buras, MD or one of the following Advanced Practice Providers on your designated Care Team: Azalee CourseHao Meng, PA-C  Micah FlesherAngela Duke, PA-C      Signed, Thurmon FairMihai Coila Wardell, MD  02/10/2019 3:25 PM    [facility]

## 2019-02-11 ENCOUNTER — Other Ambulatory Visit (INDEPENDENT_AMBULATORY_CARE_PROVIDER_SITE_OTHER): Payer: Medicare HMO

## 2019-02-11 DIAGNOSIS — I2510 Atherosclerotic heart disease of native coronary artery without angina pectoris: Secondary | ICD-10-CM | POA: Diagnosis not present

## 2019-02-11 DIAGNOSIS — N183 Chronic kidney disease, stage 3 (moderate): Secondary | ICD-10-CM | POA: Diagnosis not present

## 2019-02-11 DIAGNOSIS — I252 Old myocardial infarction: Secondary | ICD-10-CM | POA: Diagnosis not present

## 2019-02-11 DIAGNOSIS — I5042 Chronic combined systolic (congestive) and diastolic (congestive) heart failure: Secondary | ICD-10-CM

## 2019-02-11 DIAGNOSIS — I10 Essential (primary) hypertension: Secondary | ICD-10-CM

## 2019-02-11 DIAGNOSIS — E1122 Type 2 diabetes mellitus with diabetic chronic kidney disease: Secondary | ICD-10-CM | POA: Diagnosis not present

## 2019-02-11 DIAGNOSIS — R5381 Other malaise: Secondary | ICD-10-CM | POA: Diagnosis not present

## 2019-02-11 DIAGNOSIS — I509 Heart failure, unspecified: Secondary | ICD-10-CM | POA: Diagnosis not present

## 2019-02-11 DIAGNOSIS — I272 Pulmonary hypertension, unspecified: Secondary | ICD-10-CM | POA: Diagnosis not present

## 2019-02-18 DIAGNOSIS — Z23 Encounter for immunization: Secondary | ICD-10-CM | POA: Diagnosis not present

## 2019-02-18 DIAGNOSIS — I252 Old myocardial infarction: Secondary | ICD-10-CM | POA: Diagnosis not present

## 2019-02-18 DIAGNOSIS — I509 Heart failure, unspecified: Secondary | ICD-10-CM | POA: Diagnosis not present

## 2019-02-18 DIAGNOSIS — I10 Essential (primary) hypertension: Secondary | ICD-10-CM | POA: Diagnosis not present

## 2019-02-18 DIAGNOSIS — R5381 Other malaise: Secondary | ICD-10-CM | POA: Diagnosis not present

## 2019-02-18 DIAGNOSIS — I2510 Atherosclerotic heart disease of native coronary artery without angina pectoris: Secondary | ICD-10-CM | POA: Diagnosis not present

## 2019-02-18 DIAGNOSIS — I272 Pulmonary hypertension, unspecified: Secondary | ICD-10-CM | POA: Diagnosis not present

## 2019-02-18 DIAGNOSIS — N183 Chronic kidney disease, stage 3 (moderate): Secondary | ICD-10-CM | POA: Diagnosis not present

## 2019-02-18 DIAGNOSIS — E1122 Type 2 diabetes mellitus with diabetic chronic kidney disease: Secondary | ICD-10-CM | POA: Diagnosis not present

## 2019-03-11 DIAGNOSIS — R32 Unspecified urinary incontinence: Secondary | ICD-10-CM | POA: Diagnosis not present

## 2019-04-12 DIAGNOSIS — L299 Pruritus, unspecified: Secondary | ICD-10-CM | POA: Diagnosis not present

## 2019-04-12 DIAGNOSIS — Z6834 Body mass index (BMI) 34.0-34.9, adult: Secondary | ICD-10-CM | POA: Diagnosis not present

## 2019-04-12 DIAGNOSIS — I10 Essential (primary) hypertension: Secondary | ICD-10-CM | POA: Diagnosis not present

## 2019-04-13 DIAGNOSIS — Z6834 Body mass index (BMI) 34.0-34.9, adult: Secondary | ICD-10-CM | POA: Diagnosis not present

## 2019-04-13 DIAGNOSIS — E1165 Type 2 diabetes mellitus with hyperglycemia: Secondary | ICD-10-CM | POA: Diagnosis not present

## 2019-04-13 DIAGNOSIS — I509 Heart failure, unspecified: Secondary | ICD-10-CM | POA: Diagnosis not present

## 2019-04-13 DIAGNOSIS — I110 Hypertensive heart disease with heart failure: Secondary | ICD-10-CM | POA: Diagnosis not present

## 2019-04-13 DIAGNOSIS — E261 Secondary hyperaldosteronism: Secondary | ICD-10-CM | POA: Diagnosis not present

## 2019-04-22 DIAGNOSIS — I10 Essential (primary) hypertension: Secondary | ICD-10-CM | POA: Diagnosis not present

## 2019-04-22 DIAGNOSIS — H548 Legal blindness, as defined in USA: Secondary | ICD-10-CM | POA: Diagnosis not present

## 2019-06-01 ENCOUNTER — Other Ambulatory Visit: Payer: Self-pay | Admitting: Cardiology

## 2019-06-01 DIAGNOSIS — E782 Mixed hyperlipidemia: Secondary | ICD-10-CM

## 2019-06-01 DIAGNOSIS — I213 ST elevation (STEMI) myocardial infarction of unspecified site: Secondary | ICD-10-CM

## 2019-06-29 DIAGNOSIS — H409 Unspecified glaucoma: Secondary | ICD-10-CM | POA: Diagnosis not present

## 2019-06-29 DIAGNOSIS — E11621 Type 2 diabetes mellitus with foot ulcer: Secondary | ICD-10-CM | POA: Diagnosis not present

## 2019-06-29 DIAGNOSIS — E1121 Type 2 diabetes mellitus with diabetic nephropathy: Secondary | ICD-10-CM | POA: Diagnosis not present

## 2019-06-29 DIAGNOSIS — E7800 Pure hypercholesterolemia, unspecified: Secondary | ICD-10-CM | POA: Diagnosis not present

## 2019-06-29 DIAGNOSIS — E1122 Type 2 diabetes mellitus with diabetic chronic kidney disease: Secondary | ICD-10-CM | POA: Diagnosis not present

## 2019-06-29 DIAGNOSIS — N183 Chronic kidney disease, stage 3 unspecified: Secondary | ICD-10-CM | POA: Diagnosis not present

## 2019-06-29 DIAGNOSIS — I509 Heart failure, unspecified: Secondary | ICD-10-CM | POA: Diagnosis not present

## 2019-06-29 DIAGNOSIS — F322 Major depressive disorder, single episode, severe without psychotic features: Secondary | ICD-10-CM | POA: Diagnosis not present

## 2019-06-29 DIAGNOSIS — I10 Essential (primary) hypertension: Secondary | ICD-10-CM | POA: Diagnosis not present

## 2019-07-15 DIAGNOSIS — I509 Heart failure, unspecified: Secondary | ICD-10-CM | POA: Diagnosis not present

## 2019-07-15 DIAGNOSIS — I110 Hypertensive heart disease with heart failure: Secondary | ICD-10-CM | POA: Diagnosis not present

## 2019-07-15 DIAGNOSIS — R350 Frequency of micturition: Secondary | ICD-10-CM | POA: Diagnosis not present

## 2019-07-15 DIAGNOSIS — I25118 Atherosclerotic heart disease of native coronary artery with other forms of angina pectoris: Secondary | ICD-10-CM | POA: Diagnosis not present

## 2019-07-15 DIAGNOSIS — E261 Secondary hyperaldosteronism: Secondary | ICD-10-CM | POA: Diagnosis not present

## 2019-07-20 DIAGNOSIS — H409 Unspecified glaucoma: Secondary | ICD-10-CM | POA: Diagnosis not present

## 2019-07-20 DIAGNOSIS — I10 Essential (primary) hypertension: Secondary | ICD-10-CM | POA: Diagnosis not present

## 2019-07-20 DIAGNOSIS — I252 Old myocardial infarction: Secondary | ICD-10-CM | POA: Diagnosis not present

## 2019-07-20 DIAGNOSIS — I509 Heart failure, unspecified: Secondary | ICD-10-CM | POA: Diagnosis not present

## 2019-07-20 DIAGNOSIS — E11621 Type 2 diabetes mellitus with foot ulcer: Secondary | ICD-10-CM | POA: Diagnosis not present

## 2019-07-20 DIAGNOSIS — N183 Chronic kidney disease, stage 3 unspecified: Secondary | ICD-10-CM | POA: Diagnosis not present

## 2019-07-20 DIAGNOSIS — F322 Major depressive disorder, single episode, severe without psychotic features: Secondary | ICD-10-CM | POA: Diagnosis not present

## 2019-07-20 DIAGNOSIS — E7800 Pure hypercholesterolemia, unspecified: Secondary | ICD-10-CM | POA: Diagnosis not present

## 2019-07-20 DIAGNOSIS — E1122 Type 2 diabetes mellitus with diabetic chronic kidney disease: Secondary | ICD-10-CM | POA: Diagnosis not present

## 2019-07-21 DIAGNOSIS — N390 Urinary tract infection, site not specified: Secondary | ICD-10-CM | POA: Diagnosis not present

## 2019-07-31 DIAGNOSIS — Z87440 Personal history of urinary (tract) infections: Secondary | ICD-10-CM | POA: Diagnosis not present

## 2019-08-02 ENCOUNTER — Ambulatory Visit: Payer: Medicare HMO

## 2019-08-02 ENCOUNTER — Other Ambulatory Visit: Payer: Self-pay

## 2019-08-08 DIAGNOSIS — R441 Visual hallucinations: Secondary | ICD-10-CM | POA: Diagnosis not present

## 2019-08-08 DIAGNOSIS — R399 Unspecified symptoms and signs involving the genitourinary system: Secondary | ICD-10-CM | POA: Diagnosis not present

## 2019-08-14 ENCOUNTER — Telehealth (INDEPENDENT_AMBULATORY_CARE_PROVIDER_SITE_OTHER): Payer: Medicare HMO | Admitting: Cardiovascular Disease

## 2019-08-14 ENCOUNTER — Telehealth: Payer: Self-pay | Admitting: *Deleted

## 2019-08-14 ENCOUNTER — Encounter: Payer: Self-pay | Admitting: Cardiovascular Disease

## 2019-08-14 ENCOUNTER — Other Ambulatory Visit: Payer: Self-pay | Admitting: Cardiology

## 2019-08-14 VITALS — Ht 70.0 in | Wt 220.0 lb

## 2019-08-14 DIAGNOSIS — I2510 Atherosclerotic heart disease of native coronary artery without angina pectoris: Secondary | ICD-10-CM

## 2019-08-14 DIAGNOSIS — N390 Urinary tract infection, site not specified: Secondary | ICD-10-CM | POA: Diagnosis not present

## 2019-08-14 DIAGNOSIS — I453 Trifascicular block: Secondary | ICD-10-CM | POA: Diagnosis not present

## 2019-08-14 DIAGNOSIS — I5042 Chronic combined systolic (congestive) and diastolic (congestive) heart failure: Secondary | ICD-10-CM | POA: Diagnosis not present

## 2019-08-14 DIAGNOSIS — N1831 Chronic kidney disease, stage 3a: Secondary | ICD-10-CM | POA: Diagnosis not present

## 2019-08-14 DIAGNOSIS — I10 Essential (primary) hypertension: Secondary | ICD-10-CM | POA: Diagnosis not present

## 2019-08-14 DIAGNOSIS — E118 Type 2 diabetes mellitus with unspecified complications: Secondary | ICD-10-CM | POA: Diagnosis not present

## 2019-08-14 DIAGNOSIS — I5043 Acute on chronic combined systolic (congestive) and diastolic (congestive) heart failure: Secondary | ICD-10-CM

## 2019-08-14 DIAGNOSIS — E782 Mixed hyperlipidemia: Secondary | ICD-10-CM

## 2019-08-14 NOTE — Telephone Encounter (Signed)
Virtual Visit Pre-Appointment Phone Call  "(Name), I am calling you today to discuss your upcoming appointment. We are currently trying to limit exposure to the virus that causes COVID-19 by seeing patients at home rather than in the office."  1. "What is the BEST phone number to call the day of the visit?" - include this in appointment notes  2. "Do you have or have access to (through a family member/friend) a smartphone with video capability that we can use for your visit?" a. If yes - list this number in appt notes as "cell" (if different from BEST phone #) and list the appointment type as a VIDEO visit in appointment notes b. If no - list the appointment type as a PHONE visit in appointment notes  3. Confirm consent - "In the setting of the current Covid19 crisis, you are scheduled for a (phone or video) visit with your provider on (date) at (time).  Just as we do with many in-office visits, in order for you to participate in this visit, we must obtain consent.  If you'd like, I can send this to your mychart (if signed up) or email for you to review.  Otherwise, I can obtain your verbal consent now.  All virtual visits are billed to your insurance company just like a normal visit would be.  By agreeing to a virtual visit, we'd like you to understand that the technology does not allow for your provider to perform an examination, and thus may limit your provider's ability to fully assess your condition. If your provider identifies any concerns that need to be evaluated in person, we will make arrangements to do so.  Finally, though the technology is pretty good, we cannot assure that it will always work on either your or our end, and in the setting of a video visit, we may have to convert it to a phone-only visit.  In either situation, we cannot ensure that we have a secure connection.  Are you willing to proceed?" Yes  4. Advise patient to be prepared - "Two hours prior to your appointment, go  ahead and check your blood pressure, pulse, oxygen saturation, and your weight (if you have the equipment to check those) and write them all down. When your visit starts, your provider will ask you for this information. If you have an Apple Watch or Kardia device, please plan to have heart rate information ready on the day of your appointment. Please have a pen and paper handy nearby the day of the visit as well."  5. Give patient instructions for MyChart download to smartphone OR [email or web address] as below if video visit (depending on what platform provider is using)  6. Inform patient they will receive a phone call 15 minutes prior to their appointment time (may be from unknown caller ID) so they should be prepared to answer    TELEPHONE CALL NOTE  [name] has been deemed a candidate for a follow-up tele-health visit to limit community exposure during the Covid-19 pandemic. I spoke with the patient via phone to ensure availability of phone/video source, confirm preferred email & phone number, and discuss instructions and expectations.  I reminded [name] to be prepared with any vital sign and/or heart rhythm information that could potentially be obtained via home monitoring, at the time of his visit. I reminded [name] to expect a phone call prior to his visit.  Sandi Mariscal, RN 08/14/2019 8:58 AM   INSTRUCTIONS  FOR DOWNLOADING THE MYCHART APP TO SMARTPHONE  - The patient must first make sure to have activated MyChart and know their login information - If Apple, go to [employer] and type in MyChart in the search bar and download the app. If Android, ask patient to go to [employer] and type in [place] in the search bar and download the app. The app is free but as with any other app downloads, their phone may require them to verify saved payment information or Apple/Android password.  - The patient will need to then log into the app with their MyChart  username and password, and select  as their healthcare provider to link the account. When it is time for your visit, go to the MyChart app, find appointments, and click Begin Video Visit. Be sure to Select Allow for your device to access the Microphone and Camera for your visit. You will then be connected, and your provider will be with you shortly.  **If they have any issues connecting, or need assistance please contact MyChart service desk [phone])**  **If using a computer, in order to ensure the best quality for their visit they will need to use either of the following Internet Browsers: [employer], or Google Chrome**  IF USING DOXIMITY or DOXY.ME - The patient will receive a link just prior to their visit by text.     FULL LENGTH CONSENT FOR TELE-HEALTH VISIT   I hereby voluntarily request, consent and authorize [facility] and its employed or contracted physicians, physician assistants, nurse practitioners or other licensed health care professionals (the Practitioner), to provide me with telemedicine health care services (the "Services") as deemed necessary by the treating Practitioner. I acknowledge and consent to receive the Services by the Practitioner via telemedicine. I understand that the telemedicine visit will involve communicating with the Practitioner through live audiovisual communication technology and the disclosure of certain medical information by electronic transmission. I acknowledge that I have been given the opportunity to request an in-person assessment or other available alternative prior to the telemedicine visit and am voluntarily participating in the telemedicine visit.  I understand that I have the right to withhold or withdraw my consent to the use of telemedicine in the course of my care at any time, without affecting my right to future care or treatment, and that the Practitioner or I may terminate the telemedicine visit at any  time. I understand that I have the right to inspect all information obtained and/or recorded in the course of the telemedicine visit and may receive copies of available information for a reasonable fee.  I understand that some of the potential risks of receiving the Services via telemedicine include:  Marland Kitchen Delay or interruption in medical evaluation due to technological equipment failure or disruption; . Information transmitted may not be sufficient (e.g. poor resolution of images) to allow for appropriate medical decision making by the Practitioner; and/or  . In rare instances, security protocols could fail, causing a breach of personal health information.  Furthermore, I acknowledge that it is my responsibility to provide information about my medical history, conditions and care that is complete and accurate to the best of my ability. I acknowledge that Practitioner's advice, recommendations, and/or decision may be based on factors not within their control, such as incomplete or inaccurate data provided by me or distortions of diagnostic images or specimens that may result from electronic transmissions. I understand that the practice of medicine is not an exact science and  that Practitioner makes no warranties or guarantees regarding treatment outcomes. I acknowledge that I will receive a copy of this consent concurrently upon execution via email to the email address I last provided but may also request a printed copy by calling the office of [facility].    I understand that my insurance will be billed for this visit.   I have read or had this consent read to me. . I understand the contents of this consent, which adequately explains the benefits and risks of the Services being provided via telemedicine.  . I have been provided ample opportunity to ask questions regarding this consent and the Services and have had my questions answered to my satisfaction. . I give my informed consent for the services  to be provided through the use of telemedicine in my medical care  By participating in this telemedicine visit I agree to the above.

## 2019-08-14 NOTE — Patient Instructions (Signed)

## 2019-08-14 NOTE — Progress Notes (Signed)
Virtual Visit via Telephone Note   This visit type was conducted due to national recommendations for restrictions regarding the COVID-19 Pandemic (e.g. social distancing) in an effort to limit this patient's exposure and mitigate transmission in our community.  Due to his co-morbid illnesses, this patient is at least at moderate risk for complications without adequate follow up.  This format is felt to be most appropriate for this patient at this time.  The patient did not have access to video technology/had technical difficulties with video requiring transitioning to audio format only (telephone).  All issues noted in this document were discussed and addressed.  No physical exam could be performed with this format.  Please refer to the patient's chart for his  consent to telehealth for [facility].   The patient was identified using 2 identifiers.  Date:  08/14/2019   ID:  [name], DOB 1930/06/18, MRN [id number]  Patient Location: Home Provider Location: Office  PCP:  Aretta Nip, MD  Cardiologist:  Sanda Klein, MD  Electrophysiologist:  None   Evaluation Performed:  Follow-Up Visit  Chief Complaint:  CHF, CAD  History of Present Illness:    [name] is a 86 y.o. male with chronic combined systolic and diastolic heart failure (LVEF 40-45%) following inferolateral myocardial infarction in June 2019 (DES to OM 2, DES to proximal circumflex, DES x2 to the RCA, residual 70% mid LAD, 85% distal LAD, 95% small diagonal), trifascicular block, history of syncope, type II diabetes complicated by nephropathy, hypercholesterolemia extensive problems with degenerative joint disease involving his knees and lower back, essential hypertension.  He is doing generally well.  He has been exercising less due to knee pain, but gets around the house with a walker.  He can get to the bedroom-bathroom-kitchen without complaints of shortness of breath or chest pain.  He denies  syncope or dizziness.  He has not had leg edema or claudication or focal neurological events.  He has not had any falls.  He is legally blind.  He had some visual hallucinations during a period of urinary tract infection and dehydration.  Otherwise his cognitive abilities and memory have been stable.  Most recent labs from September 2020 showed an LDL cholesterol of 64 and a hemoglobin A1c of 7.1%.  Information was obtained from both the patient and his closest kin, stepdaughter [name].  The patient does not have symptoms concerning for COVID-19 infection (fever, chills, cough, or new shortness of breath).    Past Medical History:  Diagnosis Date  . Arthritis    "all over" (11/12/2017)  . CAD (coronary artery disease) 11/11/2017   S/p IL STEMI 6/19:  S/p DES to pLCx and DES x 2 to OM2 // staged PCI 6/19:  DES x 2 to RCA // residual CAD at cath 6/19:  mLAD 80, dLAD 90, D1 95 tx medically // EF 40-45 by echo  . Chronic combined systolic and diastolic CHF (congestive heart failure) (HCC)    ischemic CM // s/p MI in 11/2017 >> Echo 6/19:  Moderate concentric LVH, EF 63-33, grade 1 diastolic dysfunction, inferior, inferolateral, apical inferior akinesis   . Chronic lower back pain   . CKD (chronic kidney disease), stage III ([place])   . Constipation    takes an OTC med as needed  . Diabetic nephropathy ([place])   . Gastric ulcer 1970s  . Glaucoma, both eyes    uses eye drops daily  . Gout    takes Allopurinol daily  .  History of colon polyps   . History of ST elevation myocardial infarction (STEMI) 11/10/2017   PCI to LCx and staged PCI to RCA  . History of stress test    a. 09/2014 MV: EF 53%, no ischemia/infarct.  . Hyperlipidemia    takes Pravastatin daily  . Hypertension    takes Amlodipine and Losartan daily  . Insomnia    uses OTC sleep aide  . Joint pain   . Joint swelling    right knee   . Legally blind   . Macular degeneration   . Morbid obesity (HCC)   . Nocturia   .  Peripheral edema   . Pleurisy 20+yrs ago  . Pneumonia    "I think once when I was a kid" (11/12/2017)  . Sleep apnea   . Stroke [facility])    "I've had them; don't know anything about them"; denies residual on 11/12/2017)  . Syncope    a. 09/2014 in setting of orthostasis; b. 10/2014 Event monitor: 19 beat run of asymp NSVT; c. 01/2015 Echo: EF 60-65%, no rwma.  . Type II diabetes mellitus (HCC)    takes Metformin daily  . Urinary frequency    takes Flomax daily   Past Surgical History:  Procedure Laterality Date  . BACK SURGERY    . CATARACT EXTRACTION W/ INTRAOCULAR LENS  IMPLANT, BILATERAL Bilateral   . COLONOSCOPY    . CORONARY ANGIOGRAPHY N/A 11/12/2017   Procedure: CORONARY ANGIOGRAPHY;  Surgeon: Lennette Bihari, MD;  Location: [facility] INVASIVE CV LAB;  Service: Cardiovascular;  Laterality: N/A;  . CORONARY STENT INTERVENTION N/A 11/12/2017   Procedure: CORONARY STENT INTERVENTION;  Surgeon: Lennette Bihari, MD;  Location: [facility] INVASIVE CV LAB;  Service: Cardiovascular;  Laterality: N/A;  . CORONARY/GRAFT ACUTE MI REVASCULARIZATION N/A 11/10/2017   Procedure: Coronary/Graft Acute MI Revascularization;  Surgeon: Lennette Bihari, MD;  Location: [facility] INVASIVE CV LAB;  Service: Cardiovascular;  Laterality: N/A;  . EYE SURGERY    . GLAUCOMA SURGERY Bilateral   . INSERTION / PLACEMENT / REVISION NEUROSTIMULATOR  2000s  . KNEE ARTHROSCOPY Right 1990s  . LEFT HEART CATH AND CORONARY ANGIOGRAPHY N/A 11/10/2017   Procedure: LEFT HEART CATH AND CORONARY ANGIOGRAPHY;  Surgeon: Lennette Bihari, MD;  Location: [facility] INVASIVE CV LAB;  Service: Cardiovascular;  Laterality: N/A;  . MINI SHUNT INSERTION  09/26/2011   Procedure: INSERTION OF MINI SHUNT;  Surgeon: Chalmers Guest, MD;  Location[facility];  Service: Ophthalmology;  Laterality: Right;  Insertion of Ahmed shunt  . POSTERIOR FUSION LUMBAR SPINE  1990s  . REVERSE SHOULDER ARTHROPLASTY Right 09/21/2013   Procedure: RIGHT  SHOULDER ARTHROPLASTY REVERSE ;  Surgeon: [name], MD;  Location: [facility] OR;  Service: Orthopedics;  Laterality: Right;  . SHOULDER INJECTION Left 09/21/2013   Procedure: SHOULDER INJECTION;  Surgeon: [name], MD;  Location: [facility] OR;  Service: Orthopedics;  Laterality: Left;     No outpatient medications have been marked as taking for the 08/14/19 encounter (Appointment) with Thurmon Fair, MD.     Allergies:   Patient has no known allergies.   Social History   Tobacco Use  . Smoking status: Former Smoker    Packs/day: 2.00    Types: Cigarettes  . Smokeless tobacco: Never Used  . Tobacco comment: 11/12/2017 "nothing since the 1980s"  Substance Use Topics  . Alcohol use: Not Currently    Comment: 11/12/2017 "nothing since the 1980s"  . Drug use: Never  Family Hx: The patient's family history includes Alzheimer's disease in his maternal aunt; Cancer in his mother. There is no history of Anesthesia problems.  ROS:   Please see the history of present illness.     All other systems reviewed and are negative.   Prior CV studies:   The following studies were reviewed today:  Labs/Other Tests and Data Reviewed:    EKG:  No ECG reviewed.  Recent Labs: No results found for requested labs within last 8760 hours.   Labs from 02/18/2019 Creatinine 1.32, potassium 4.7, hemoglobin 12.9, hemoglobin A1c 7.1% Total cholesterol 118/HDL 37/LDL 64/triglycerides 82  Recent Lipid Panel Lab Results  Component Value Date/Time   CHOL 138 11/11/2017 02:17 AM   TRIG 72 11/11/2017 02:17 AM   HDL 36 (L) 11/11/2017 02:17 AM   CHOLHDL 3.8 11/11/2017 02:17 AM   LDLCALC 88 11/11/2017 02:17 AM    Wt Readings from Last 3 Encounters:  02/10/19 241 lb (109.3 kg)  07/02/18 227 lb 12.8 oz (103.3 kg)  05/30/18 219 lb 6.4 oz (99.5 kg)     Objective:    Vital Signs:  Ht 5\' 10"  (1.778 m)   Wt 220 lb (99.8 kg)   BMI 31.57 kg/m    VITAL SIGNS:  reviewed  ASSESSMENT & PLAN:    1. CHF: Quite sedentary, but as far as I can tell  he is at worst functional class II.  He does not have physical findings that suggest hypervolemia according to his family.  His weight has increased but he is also been more sedentary.  He is taking a very low-dose of loop diuretic.  He does not have orthopnea or PND or leg edema.  EF was 40-45% with inferolateral wall motion abnormality.  He is receiving beta-blockers, but was not started on RAAS inhibitors due to concerns regarding his kidney function.  Continue same medications. 2. CAD: He does not have angina pectoris.  He is receiving aspirin, statin, beta-blocker. 3. HLP: All lipid parameters within desirable range, with the exception of a borderline HDL cholesterol.  Continue statin. 4. HTN: He has an aide named that checks his blood pressure regularly.  His blood pressure has been "normal" although we were not able to obtain an actual value today. 5. DM: Adequate glycemic control. 6. Trifascicular block: Although he has evidence of extensive infrahisian conduction disease his episodes of syncope completely resolved after revascularization.  It is possible that he had high-grade AV block due to AV node ischemia, which has now been corrected.  Might still be a candidate for pacemaker in the future. 7. CKD 3: Stable renal function.  In December 2019 his creatinine increased to 2.4 (GFR 27), but subsequently improved and has been stable with a GFR around 55.  COVID-19 Education: The signs and symptoms of COVID-19 were discussed with the patient and how to seek care for testing (follow up with PCP or arrange E-visit).  The importance of social distancing was discussed today.  Time:   Today, I have spent 26 minutes with the patient and his daughter with telehealth technology discussing the above problems.     Medication Adjustments/Labs and Tests Ordered: Current medicines are reviewed at length with the patient today.  Concerns regarding medicines are outlined above.   Tests Ordered: No  orders of the defined types were placed in this encounter.   Medication Changes: No orders of the defined types were placed in this encounter.   Follow Up: 6 months in person  Signed, Thurmon Fair, MD  08/14/2019 8:25 AM    [facility]

## 2019-08-17 DIAGNOSIS — N390 Urinary tract infection, site not specified: Secondary | ICD-10-CM | POA: Diagnosis not present

## 2019-08-17 DIAGNOSIS — I2510 Atherosclerotic heart disease of native coronary artery without angina pectoris: Secondary | ICD-10-CM | POA: Diagnosis not present

## 2019-08-17 DIAGNOSIS — E1122 Type 2 diabetes mellitus with diabetic chronic kidney disease: Secondary | ICD-10-CM | POA: Diagnosis not present

## 2019-08-17 DIAGNOSIS — E7800 Pure hypercholesterolemia, unspecified: Secondary | ICD-10-CM | POA: Diagnosis not present

## 2019-08-17 DIAGNOSIS — I10 Essential (primary) hypertension: Secondary | ICD-10-CM | POA: Diagnosis not present

## 2019-08-19 DIAGNOSIS — E1151 Type 2 diabetes mellitus with diabetic peripheral angiopathy without gangrene: Secondary | ICD-10-CM | POA: Diagnosis not present

## 2019-08-19 DIAGNOSIS — E1122 Type 2 diabetes mellitus with diabetic chronic kidney disease: Secondary | ICD-10-CM | POA: Diagnosis not present

## 2019-08-19 DIAGNOSIS — N183 Chronic kidney disease, stage 3 unspecified: Secondary | ICD-10-CM | POA: Diagnosis not present

## 2019-08-19 DIAGNOSIS — Z7982 Long term (current) use of aspirin: Secondary | ICD-10-CM | POA: Diagnosis not present

## 2019-08-19 DIAGNOSIS — Z515 Encounter for palliative care: Secondary | ICD-10-CM | POA: Diagnosis not present

## 2019-08-19 DIAGNOSIS — E1165 Type 2 diabetes mellitus with hyperglycemia: Secondary | ICD-10-CM | POA: Diagnosis not present

## 2019-08-19 DIAGNOSIS — E113593 Type 2 diabetes mellitus with proliferative diabetic retinopathy without macular edema, bilateral: Secondary | ICD-10-CM | POA: Diagnosis not present

## 2019-09-03 ENCOUNTER — Ambulatory Visit: Payer: Medicare HMO | Attending: Family

## 2019-09-03 DIAGNOSIS — Z23 Encounter for immunization: Secondary | ICD-10-CM

## 2019-09-03 NOTE — Progress Notes (Signed)
   Covid-19 Vaccination Clinic  Name:  [name]    MRN: [id number] DOB: February 07, 1931  09/03/2019  [name] was observed post Covid-19 immunization for 15 minutes without incident. He was provided with Vaccine Information Sheet and instruction to access the V-Safe system.   [name] was instructed to call 911 with any severe reactions post vaccine: Marland Kitchen Difficulty breathing  . Swelling of face and throat  . A fast heartbeat  . A bad rash all over body  . Dizziness and weakness   Immunizations Administered    Name Date Dose VIS Date Route   Moderna COVID-19 Vaccine 09/03/2019 11:48 AM 0.5 mL 05/05/2019 Intramuscular   Manufacturer: Moderna   Lot: [id number]   NDC: 87276-184-85

## 2019-09-09 ENCOUNTER — Other Ambulatory Visit: Payer: Self-pay | Admitting: Cardiovascular Disease

## 2019-09-09 DIAGNOSIS — N390 Urinary tract infection, site not specified: Secondary | ICD-10-CM | POA: Diagnosis not present

## 2019-09-09 DIAGNOSIS — I509 Heart failure, unspecified: Secondary | ICD-10-CM | POA: Diagnosis not present

## 2019-09-09 DIAGNOSIS — I10 Essential (primary) hypertension: Secondary | ICD-10-CM | POA: Diagnosis not present

## 2019-09-09 DIAGNOSIS — F322 Major depressive disorder, single episode, severe without psychotic features: Secondary | ICD-10-CM | POA: Diagnosis not present

## 2019-09-09 DIAGNOSIS — E1122 Type 2 diabetes mellitus with diabetic chronic kidney disease: Secondary | ICD-10-CM | POA: Diagnosis not present

## 2019-09-09 DIAGNOSIS — I252 Old myocardial infarction: Secondary | ICD-10-CM | POA: Diagnosis not present

## 2019-09-09 DIAGNOSIS — E1121 Type 2 diabetes mellitus with diabetic nephropathy: Secondary | ICD-10-CM | POA: Diagnosis not present

## 2019-09-09 DIAGNOSIS — H409 Unspecified glaucoma: Secondary | ICD-10-CM | POA: Diagnosis not present

## 2019-09-09 DIAGNOSIS — E11621 Type 2 diabetes mellitus with foot ulcer: Secondary | ICD-10-CM | POA: Diagnosis not present

## 2019-09-09 DIAGNOSIS — I2510 Atherosclerotic heart disease of native coronary artery without angina pectoris: Secondary | ICD-10-CM | POA: Diagnosis not present

## 2019-09-09 NOTE — Telephone Encounter (Signed)
New message   *STAT* If patient is at the pharmacy, call can be transferred to refill team.   1. Which medications need to be refilled? (please list name of each medication and dose if known)  amLODipine (NORVASC) 5 MG tablet atorvastatin (LIPITOR) 80 MG tablet furosemide (LASIX) 20 MG tablet metoprolol succinate (TOPROL-XL) 25 MG 24 hr tablet nitroGLYCERIN (NITROSTAT) 0.4 MG SL tablet  2. Which pharmacy/location (including street and city if local pharmacy) is medication to be sent to?[facility] - [place], [place] - [id number]  3. Do they need a 30 day or 90 day supply? 90 day supply

## 2019-09-11 ENCOUNTER — Other Ambulatory Visit: Payer: Self-pay

## 2019-09-11 DIAGNOSIS — I213 ST elevation (STEMI) myocardial infarction of unspecified site: Secondary | ICD-10-CM

## 2019-09-11 DIAGNOSIS — E782 Mixed hyperlipidemia: Secondary | ICD-10-CM

## 2019-09-11 MED ORDER — ATORVASTATIN CALCIUM 80 MG PO TABS: 80.0000 mg | ORAL_TABLET | Freq: Every day | ORAL | 1 refills | Status: DC

## 2019-09-11 MED ORDER — NITROGLYCERIN 0.4 MG SL SUBL: 0.4000 mg | SUBLINGUAL_TABLET | SUBLINGUAL | 2 refills | Status: DC | PRN

## 2019-09-11 MED ORDER — FUROSEMIDE 20 MG PO TABS: 20.0000 mg | ORAL_TABLET | Freq: Every day | ORAL | 3 refills | Status: DC

## 2019-09-11 MED ORDER — AMLODIPINE BESYLATE 5 MG PO TABS: 5.0000 mg | ORAL_TABLET | Freq: Every day | ORAL | 1 refills | Status: AC

## 2019-09-18 DIAGNOSIS — Z8659 Personal history of other mental and behavioral disorders: Secondary | ICD-10-CM | POA: Diagnosis not present

## 2019-10-06 ENCOUNTER — Ambulatory Visit: Payer: Medicare HMO | Attending: Family

## 2019-10-06 DIAGNOSIS — Z23 Encounter for immunization: Secondary | ICD-10-CM

## 2019-10-06 NOTE — Progress Notes (Signed)
   Covid-19 Vaccination Clinic  Name:  ARNIE CLINGENPEEL    MRN: [id number] DOB: Oct 24, 1930  10/06/2019  Mr. Kyllo was observed post Covid-19 immunization for 15 minutes without incident. He was provided with Vaccine Information Sheet and instruction to access the V-Safe system.   Mr. Saputo was instructed to call 911 with any severe reactions post vaccine: Marland Kitchen Difficulty breathing  . Swelling of face and throat  . A fast heartbeat  . A bad rash all over body  . Dizziness and weakness   Immunizations Administered    Name Date Dose VIS Date Route   Moderna COVID-19 Vaccine 10/06/2019 11:16 AM 0.5 mL 05/2019 Intramuscular   Manufacturer: Moderna   Lot: [id number]   NDC: 25486-282-41

## 2019-10-07 DIAGNOSIS — H348112 Central retinal vein occlusion, right eye, stable: Secondary | ICD-10-CM | POA: Diagnosis not present

## 2019-10-07 DIAGNOSIS — H35362 Drusen (degenerative) of macula, left eye: Secondary | ICD-10-CM | POA: Diagnosis not present

## 2019-10-07 DIAGNOSIS — H35033 Hypertensive retinopathy, bilateral: Secondary | ICD-10-CM | POA: Diagnosis not present

## 2019-10-07 DIAGNOSIS — H43812 Vitreous degeneration, left eye: Secondary | ICD-10-CM | POA: Diagnosis not present

## 2019-10-07 DIAGNOSIS — H47233 Glaucomatous optic atrophy, bilateral: Secondary | ICD-10-CM | POA: Diagnosis not present

## 2019-10-07 DIAGNOSIS — E119 Type 2 diabetes mellitus without complications: Secondary | ICD-10-CM | POA: Diagnosis not present

## 2019-10-10 ENCOUNTER — Other Ambulatory Visit: Payer: Self-pay | Admitting: Cardiovascular Disease

## 2019-10-19 DIAGNOSIS — E11621 Type 2 diabetes mellitus with foot ulcer: Secondary | ICD-10-CM | POA: Diagnosis not present

## 2019-10-19 DIAGNOSIS — H409 Unspecified glaucoma: Secondary | ICD-10-CM | POA: Diagnosis not present

## 2019-10-19 DIAGNOSIS — I10 Essential (primary) hypertension: Secondary | ICD-10-CM | POA: Diagnosis not present

## 2019-10-19 DIAGNOSIS — N183 Chronic kidney disease, stage 3 unspecified: Secondary | ICD-10-CM | POA: Diagnosis not present

## 2019-10-19 DIAGNOSIS — E1121 Type 2 diabetes mellitus with diabetic nephropathy: Secondary | ICD-10-CM | POA: Diagnosis not present

## 2019-10-19 DIAGNOSIS — E1122 Type 2 diabetes mellitus with diabetic chronic kidney disease: Secondary | ICD-10-CM | POA: Diagnosis not present

## 2019-10-19 DIAGNOSIS — F322 Major depressive disorder, single episode, severe without psychotic features: Secondary | ICD-10-CM | POA: Diagnosis not present

## 2019-10-19 DIAGNOSIS — I2510 Atherosclerotic heart disease of native coronary artery without angina pectoris: Secondary | ICD-10-CM | POA: Diagnosis not present

## 2019-10-19 DIAGNOSIS — I509 Heart failure, unspecified: Secondary | ICD-10-CM | POA: Diagnosis not present

## 2019-10-20 DIAGNOSIS — F322 Major depressive disorder, single episode, severe without psychotic features: Secondary | ICD-10-CM | POA: Diagnosis not present

## 2019-10-20 DIAGNOSIS — E1121 Type 2 diabetes mellitus with diabetic nephropathy: Secondary | ICD-10-CM | POA: Diagnosis not present

## 2019-10-20 DIAGNOSIS — I10 Essential (primary) hypertension: Secondary | ICD-10-CM | POA: Diagnosis not present

## 2019-10-20 DIAGNOSIS — N390 Urinary tract infection, site not specified: Secondary | ICD-10-CM | POA: Diagnosis not present

## 2019-11-13 IMAGING — CR DG CHEST 2V
2 series · 2 of 2 positions shown · non-contrast
Comparison: 11/10/2017

CLINICAL DATA: Patient woke up with sharp central chest pain. STEMI
2 weeks ago with multiple stents placed.

EXAM:
CHEST - 2 VIEW

[chest lat]
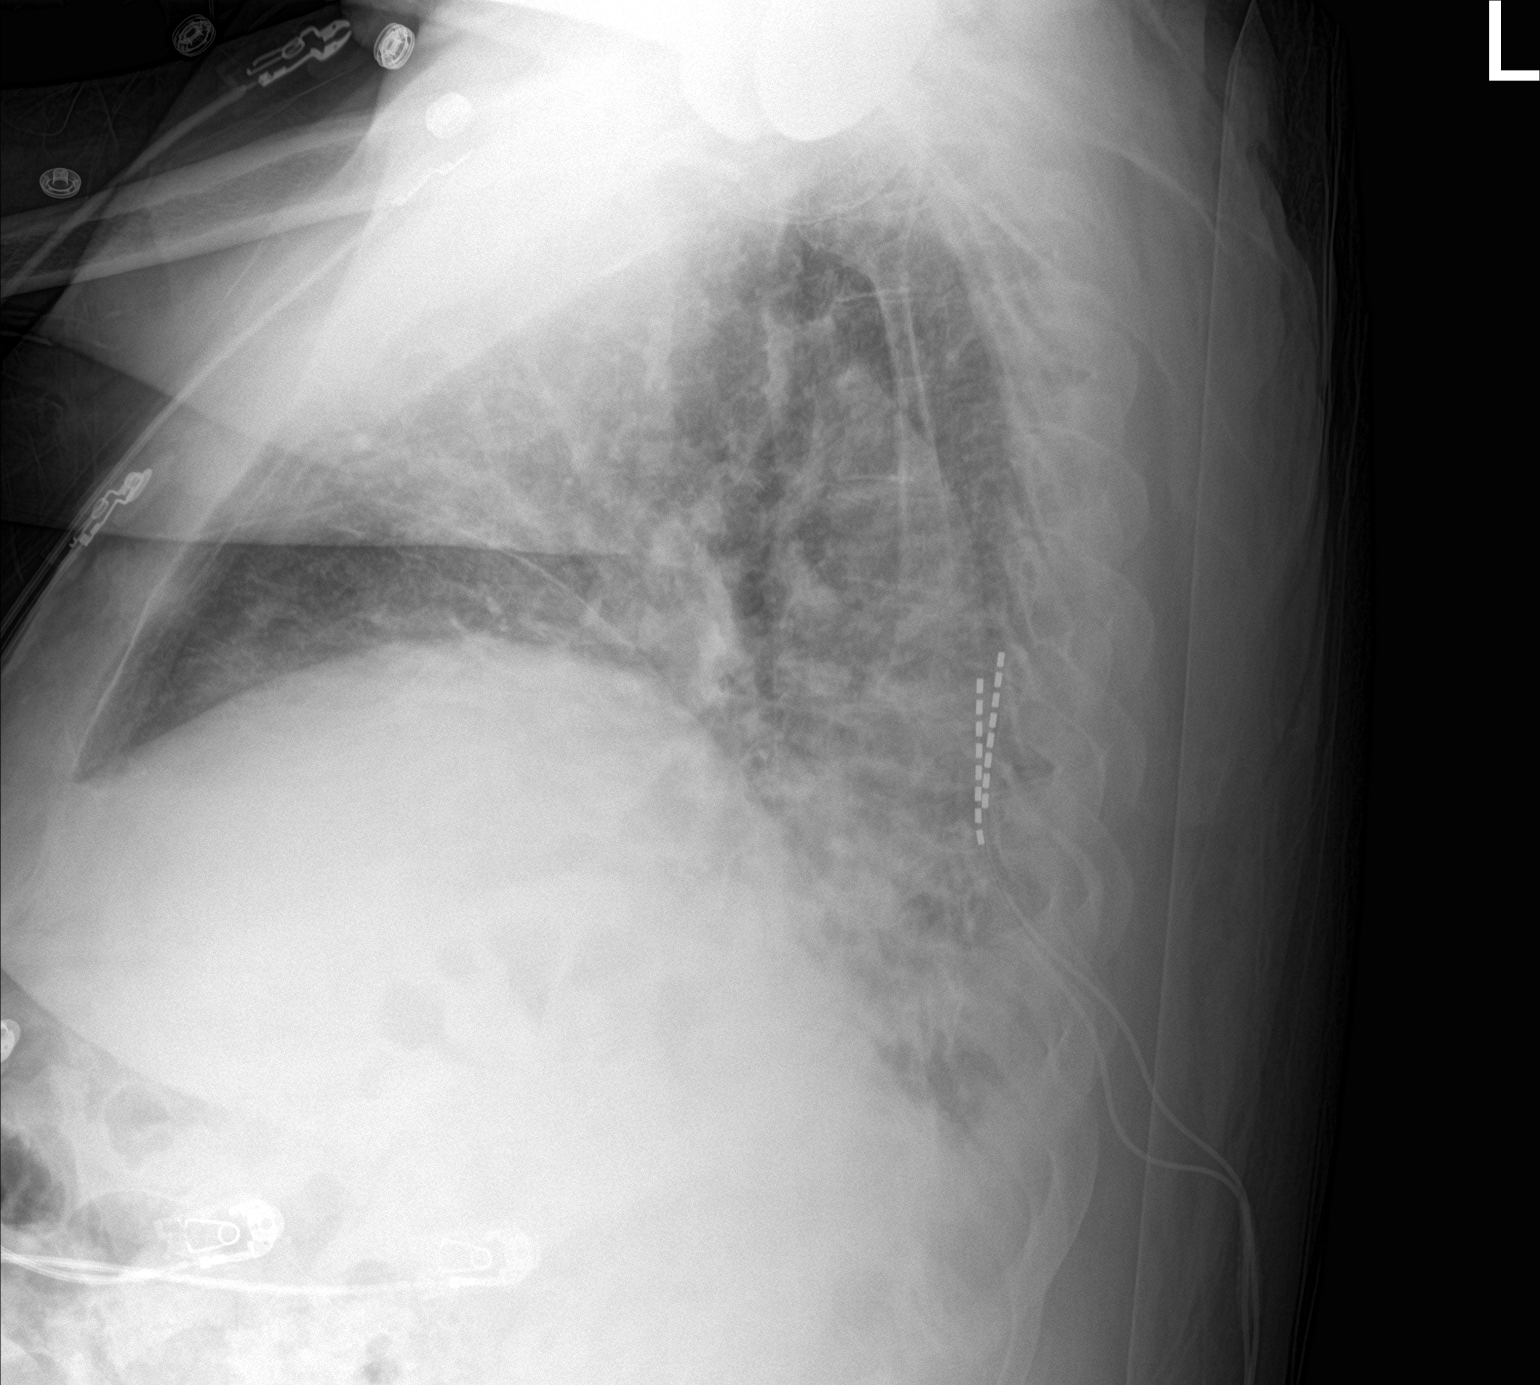

[chest ap]
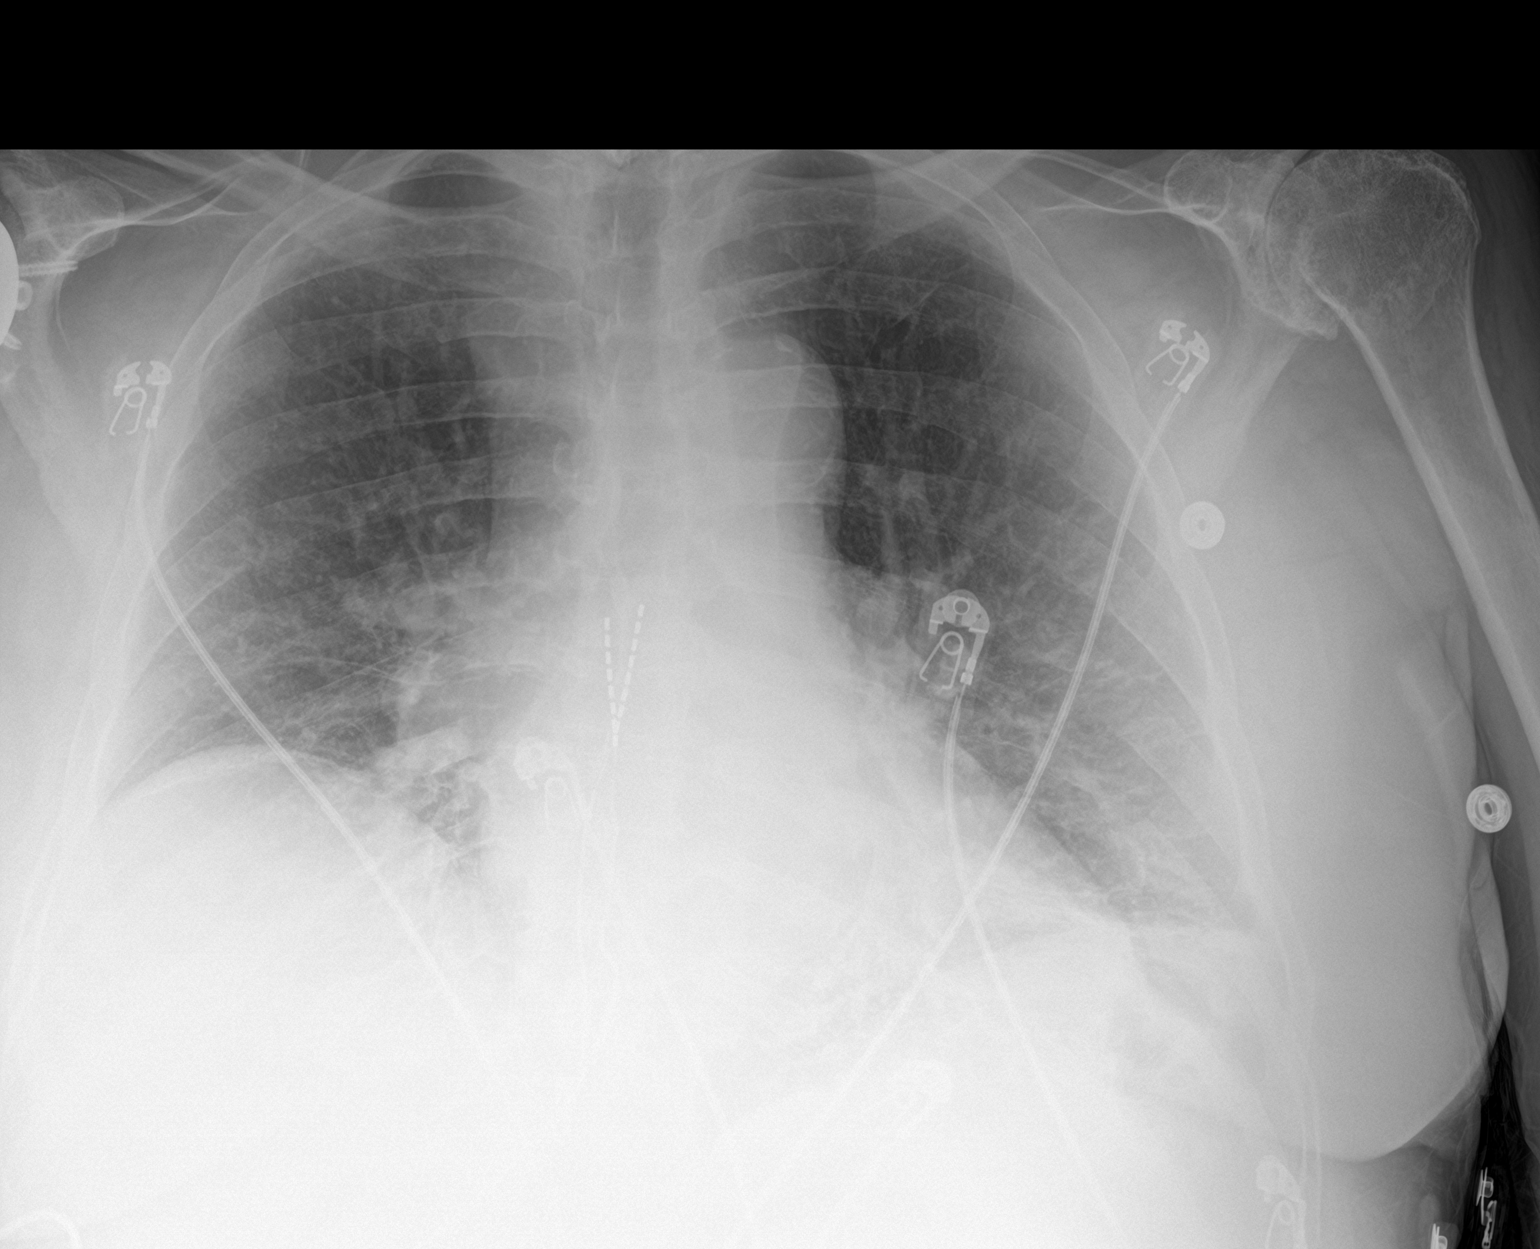

[2 of 2 positions shown; findings below may reference images not displayed]

FINDINGS: Shallow inspiration with atelectasis in the lung bases. Mild cardiac
enlargement. No vascular congestion, edema, or consolidation. No
blunting of costophrenic angles. No pneumothorax. Thoracic spinal
stimulator leads projected over the midthoracic region.
Calcification of the aorta. Degenerative changes in the spine and
shoulders.
IMPRESSION: Shallow inspiration with linear atelectasis in the lung bases.
Cardiac enlargement. No focal consolidation.

## 2019-11-20 DIAGNOSIS — R82998 Other abnormal findings in urine: Secondary | ICD-10-CM | POA: Diagnosis not present

## 2019-11-20 DIAGNOSIS — Z6835 Body mass index (BMI) 35.0-35.9, adult: Secondary | ICD-10-CM | POA: Diagnosis not present

## 2019-11-20 DIAGNOSIS — R8290 Unspecified abnormal findings in urine: Secondary | ICD-10-CM | POA: Diagnosis not present

## 2019-11-20 DIAGNOSIS — Z87440 Personal history of urinary (tract) infections: Secondary | ICD-10-CM | POA: Diagnosis not present

## 2019-11-20 DIAGNOSIS — I10 Essential (primary) hypertension: Secondary | ICD-10-CM | POA: Diagnosis not present

## 2019-11-20 DIAGNOSIS — E1169 Type 2 diabetes mellitus with other specified complication: Secondary | ICD-10-CM | POA: Diagnosis not present

## 2019-11-23 DIAGNOSIS — R82998 Other abnormal findings in urine: Secondary | ICD-10-CM | POA: Diagnosis not present

## 2019-11-30 ENCOUNTER — Other Ambulatory Visit: Payer: Self-pay | Admitting: Cardiovascular Disease

## 2019-11-30 DIAGNOSIS — E782 Mixed hyperlipidemia: Secondary | ICD-10-CM

## 2019-11-30 DIAGNOSIS — I213 ST elevation (STEMI) myocardial infarction of unspecified site: Secondary | ICD-10-CM

## 2019-12-02 DIAGNOSIS — I272 Pulmonary hypertension, unspecified: Secondary | ICD-10-CM | POA: Diagnosis not present

## 2019-12-02 DIAGNOSIS — H409 Unspecified glaucoma: Secondary | ICD-10-CM | POA: Diagnosis not present

## 2019-12-02 DIAGNOSIS — I2510 Atherosclerotic heart disease of native coronary artery without angina pectoris: Secondary | ICD-10-CM | POA: Diagnosis not present

## 2019-12-02 DIAGNOSIS — I252 Old myocardial infarction: Secondary | ICD-10-CM | POA: Diagnosis not present

## 2019-12-02 DIAGNOSIS — E7800 Pure hypercholesterolemia, unspecified: Secondary | ICD-10-CM | POA: Diagnosis not present

## 2019-12-02 DIAGNOSIS — E1121 Type 2 diabetes mellitus with diabetic nephropathy: Secondary | ICD-10-CM | POA: Diagnosis not present

## 2019-12-02 DIAGNOSIS — F322 Major depressive disorder, single episode, severe without psychotic features: Secondary | ICD-10-CM | POA: Diagnosis not present

## 2019-12-02 DIAGNOSIS — I10 Essential (primary) hypertension: Secondary | ICD-10-CM | POA: Diagnosis not present

## 2019-12-02 DIAGNOSIS — E1122 Type 2 diabetes mellitus with diabetic chronic kidney disease: Secondary | ICD-10-CM | POA: Diagnosis not present

## 2019-12-21 DIAGNOSIS — I2510 Atherosclerotic heart disease of native coronary artery without angina pectoris: Secondary | ICD-10-CM | POA: Diagnosis not present

## 2019-12-21 DIAGNOSIS — H548 Legal blindness, as defined in USA: Secondary | ICD-10-CM | POA: Diagnosis not present

## 2019-12-21 DIAGNOSIS — Z0000 Encounter for general adult medical examination without abnormal findings: Secondary | ICD-10-CM | POA: Diagnosis not present

## 2019-12-21 DIAGNOSIS — E7800 Pure hypercholesterolemia, unspecified: Secondary | ICD-10-CM | POA: Diagnosis not present

## 2019-12-21 DIAGNOSIS — F322 Major depressive disorder, single episode, severe without psychotic features: Secondary | ICD-10-CM | POA: Diagnosis not present

## 2019-12-21 DIAGNOSIS — I509 Heart failure, unspecified: Secondary | ICD-10-CM | POA: Diagnosis not present

## 2019-12-21 DIAGNOSIS — Z9181 History of falling: Secondary | ICD-10-CM | POA: Diagnosis not present

## 2019-12-21 DIAGNOSIS — E1122 Type 2 diabetes mellitus with diabetic chronic kidney disease: Secondary | ICD-10-CM | POA: Diagnosis not present

## 2019-12-21 DIAGNOSIS — I10 Essential (primary) hypertension: Secondary | ICD-10-CM | POA: Diagnosis not present

## 2019-12-24 DIAGNOSIS — I272 Pulmonary hypertension, unspecified: Secondary | ICD-10-CM | POA: Diagnosis not present

## 2019-12-24 DIAGNOSIS — I2510 Atherosclerotic heart disease of native coronary artery without angina pectoris: Secondary | ICD-10-CM | POA: Diagnosis not present

## 2019-12-24 DIAGNOSIS — H409 Unspecified glaucoma: Secondary | ICD-10-CM | POA: Diagnosis not present

## 2019-12-24 DIAGNOSIS — E7800 Pure hypercholesterolemia, unspecified: Secondary | ICD-10-CM | POA: Diagnosis not present

## 2019-12-24 DIAGNOSIS — I10 Essential (primary) hypertension: Secondary | ICD-10-CM | POA: Diagnosis not present

## 2019-12-24 DIAGNOSIS — I252 Old myocardial infarction: Secondary | ICD-10-CM | POA: Diagnosis not present

## 2019-12-24 DIAGNOSIS — E11621 Type 2 diabetes mellitus with foot ulcer: Secondary | ICD-10-CM | POA: Diagnosis not present

## 2019-12-24 DIAGNOSIS — F322 Major depressive disorder, single episode, severe without psychotic features: Secondary | ICD-10-CM | POA: Diagnosis not present

## 2019-12-24 DIAGNOSIS — E1122 Type 2 diabetes mellitus with diabetic chronic kidney disease: Secondary | ICD-10-CM | POA: Diagnosis not present

## 2019-12-27 DIAGNOSIS — M170 Bilateral primary osteoarthritis of knee: Secondary | ICD-10-CM | POA: Diagnosis not present

## 2019-12-27 DIAGNOSIS — I272 Pulmonary hypertension, unspecified: Secondary | ICD-10-CM | POA: Diagnosis not present

## 2019-12-27 DIAGNOSIS — I2510 Atherosclerotic heart disease of native coronary artery without angina pectoris: Secondary | ICD-10-CM | POA: Diagnosis not present

## 2019-12-27 DIAGNOSIS — H548 Legal blindness, as defined in USA: Secondary | ICD-10-CM | POA: Diagnosis not present

## 2019-12-27 DIAGNOSIS — E1122 Type 2 diabetes mellitus with diabetic chronic kidney disease: Secondary | ICD-10-CM | POA: Diagnosis not present

## 2019-12-27 DIAGNOSIS — N183 Chronic kidney disease, stage 3 unspecified: Secondary | ICD-10-CM | POA: Diagnosis not present

## 2019-12-27 DIAGNOSIS — I509 Heart failure, unspecified: Secondary | ICD-10-CM | POA: Diagnosis not present

## 2019-12-27 DIAGNOSIS — I110 Hypertensive heart disease with heart failure: Secondary | ICD-10-CM | POA: Diagnosis not present

## 2019-12-27 DIAGNOSIS — G894 Chronic pain syndrome: Secondary | ICD-10-CM | POA: Diagnosis not present

## 2019-12-28 DIAGNOSIS — I509 Heart failure, unspecified: Secondary | ICD-10-CM | POA: Diagnosis not present

## 2019-12-28 DIAGNOSIS — F322 Major depressive disorder, single episode, severe without psychotic features: Secondary | ICD-10-CM | POA: Diagnosis not present

## 2019-12-29 IMAGING — CR DG CHEST 2V
2 series · 2 of 2 positions shown · non-contrast
Comparison: Chest x-rays dated 12/08/2017 and 11/10/2017.

CLINICAL DATA: Syncope today.

EXAM:
CHEST - 2 VIEW

[chest lat]
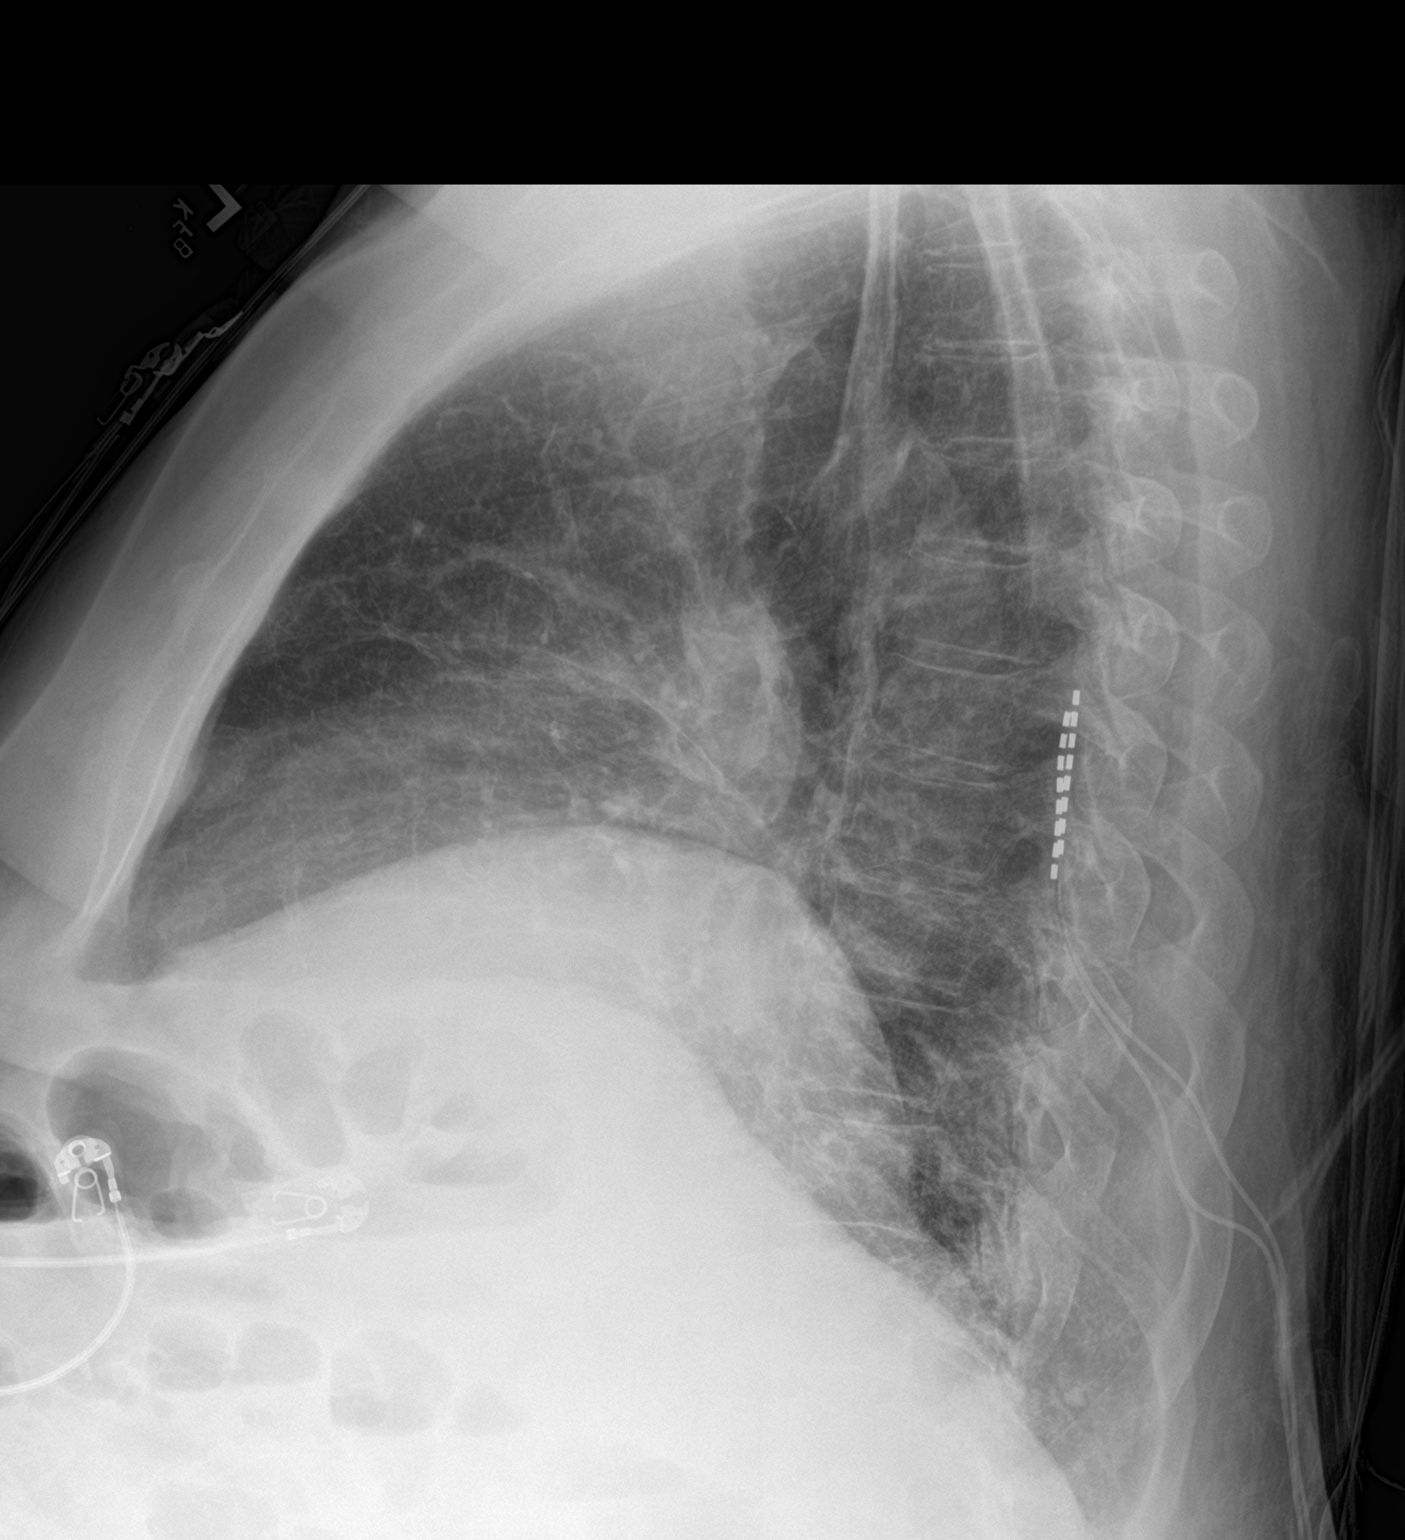

[chest ap]
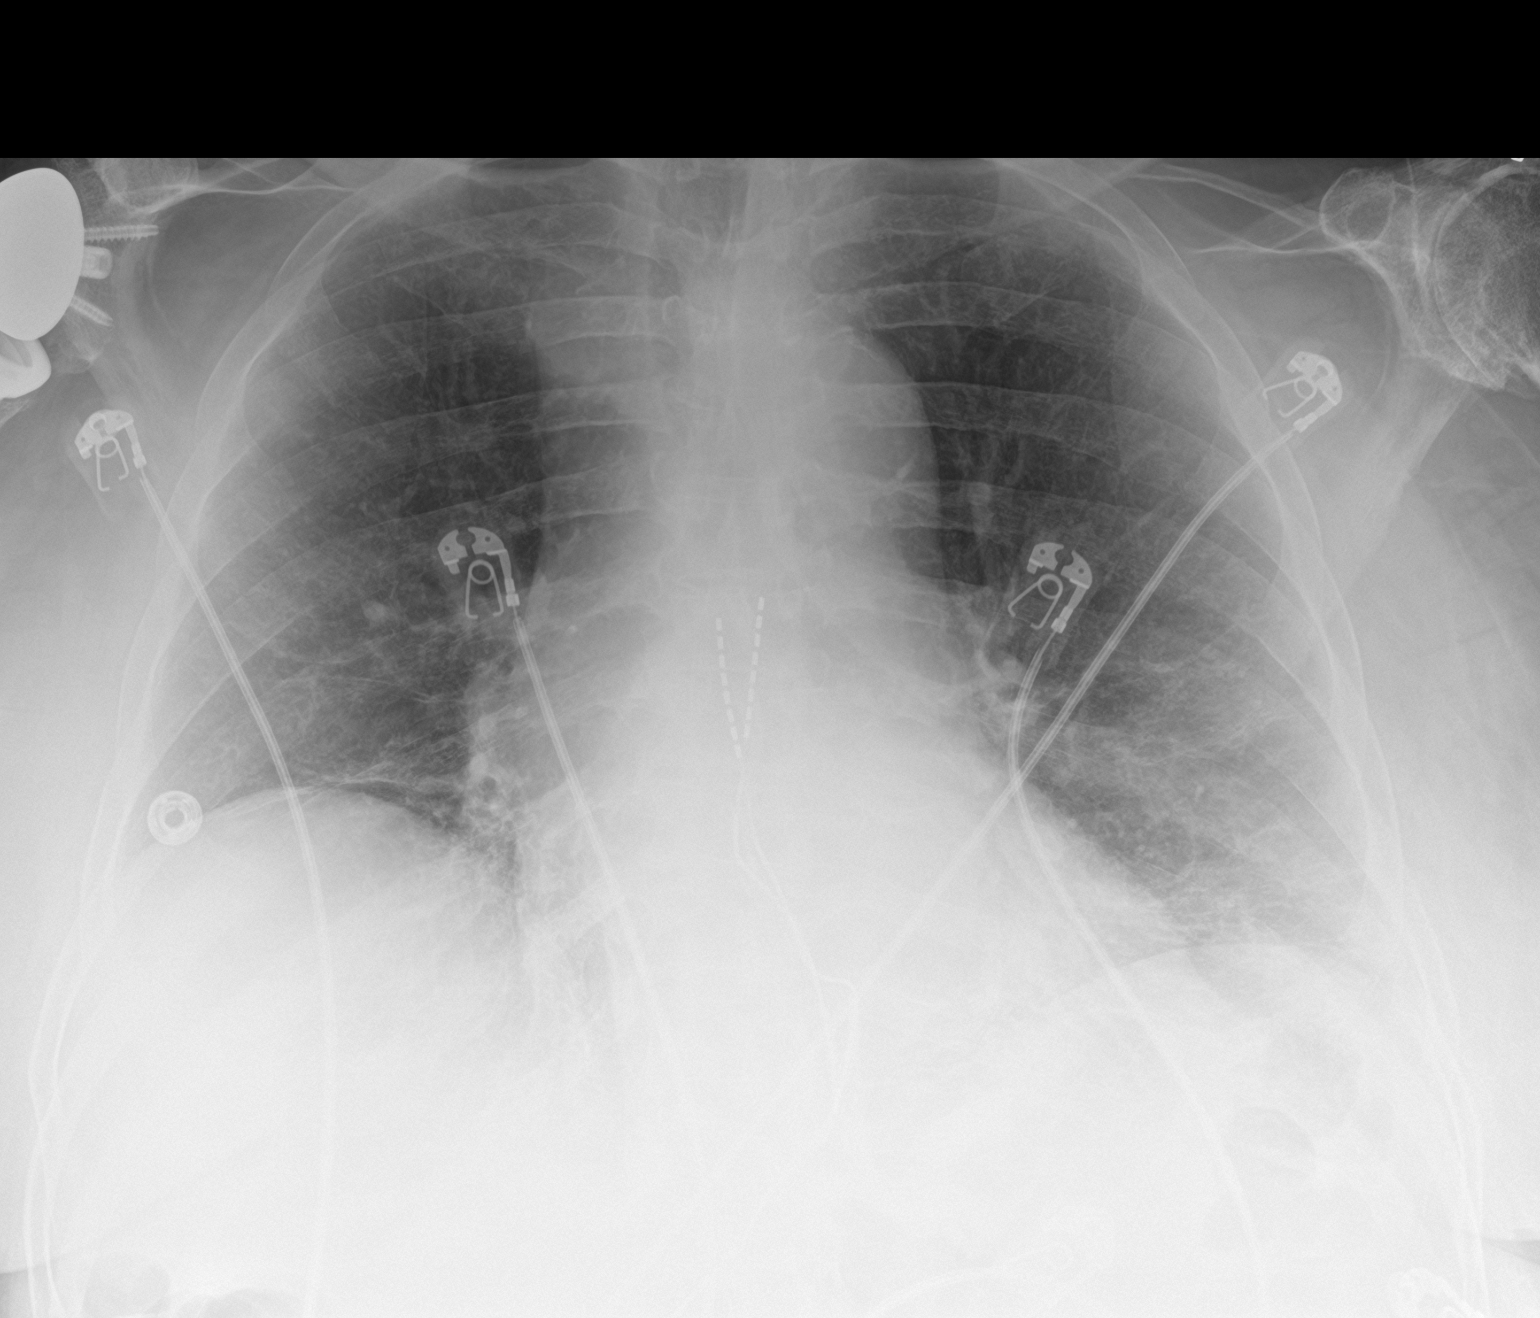

[2 of 2 positions shown; findings below may reference images not displayed]

FINDINGS: Heart size and mediastinal contours are stable. Lungs are clear. No
pleural effusion or pneumothorax seen.

Stable elevation of the RIGHT hemidiaphragm. Degenerative changes
again noted at the LEFT shoulder. RIGHT shoulder arthroplasty
hardware incompletely imaged. No acute or suspicious osseous
finding.
IMPRESSION: No active cardiopulmonary disease. No evidence of pneumonia or
pulmonary edema. Stable cardiomegaly.

## 2019-12-30 DIAGNOSIS — I110 Hypertensive heart disease with heart failure: Secondary | ICD-10-CM | POA: Diagnosis not present

## 2019-12-30 DIAGNOSIS — N183 Chronic kidney disease, stage 3 unspecified: Secondary | ICD-10-CM | POA: Diagnosis not present

## 2019-12-30 DIAGNOSIS — M170 Bilateral primary osteoarthritis of knee: Secondary | ICD-10-CM | POA: Diagnosis not present

## 2019-12-30 DIAGNOSIS — I509 Heart failure, unspecified: Secondary | ICD-10-CM | POA: Diagnosis not present

## 2019-12-30 DIAGNOSIS — I2510 Atherosclerotic heart disease of native coronary artery without angina pectoris: Secondary | ICD-10-CM | POA: Diagnosis not present

## 2019-12-30 DIAGNOSIS — I272 Pulmonary hypertension, unspecified: Secondary | ICD-10-CM | POA: Diagnosis not present

## 2019-12-30 DIAGNOSIS — G894 Chronic pain syndrome: Secondary | ICD-10-CM | POA: Diagnosis not present

## 2019-12-30 DIAGNOSIS — E1122 Type 2 diabetes mellitus with diabetic chronic kidney disease: Secondary | ICD-10-CM | POA: Diagnosis not present

## 2019-12-30 DIAGNOSIS — H548 Legal blindness, as defined in USA: Secondary | ICD-10-CM | POA: Diagnosis not present

## 2020-01-01 DIAGNOSIS — I2510 Atherosclerotic heart disease of native coronary artery without angina pectoris: Secondary | ICD-10-CM | POA: Diagnosis not present

## 2020-01-01 DIAGNOSIS — N183 Chronic kidney disease, stage 3 unspecified: Secondary | ICD-10-CM | POA: Diagnosis not present

## 2020-01-01 DIAGNOSIS — I509 Heart failure, unspecified: Secondary | ICD-10-CM | POA: Diagnosis not present

## 2020-01-01 DIAGNOSIS — E1122 Type 2 diabetes mellitus with diabetic chronic kidney disease: Secondary | ICD-10-CM | POA: Diagnosis not present

## 2020-01-01 DIAGNOSIS — H548 Legal blindness, as defined in USA: Secondary | ICD-10-CM | POA: Diagnosis not present

## 2020-01-01 DIAGNOSIS — I272 Pulmonary hypertension, unspecified: Secondary | ICD-10-CM | POA: Diagnosis not present

## 2020-01-01 DIAGNOSIS — G894 Chronic pain syndrome: Secondary | ICD-10-CM | POA: Diagnosis not present

## 2020-01-01 DIAGNOSIS — I110 Hypertensive heart disease with heart failure: Secondary | ICD-10-CM | POA: Diagnosis not present

## 2020-01-01 DIAGNOSIS — M170 Bilateral primary osteoarthritis of knee: Secondary | ICD-10-CM | POA: Diagnosis not present

## 2020-01-05 DIAGNOSIS — E1122 Type 2 diabetes mellitus with diabetic chronic kidney disease: Secondary | ICD-10-CM | POA: Diagnosis not present

## 2020-01-05 DIAGNOSIS — G894 Chronic pain syndrome: Secondary | ICD-10-CM | POA: Diagnosis not present

## 2020-01-05 DIAGNOSIS — I2510 Atherosclerotic heart disease of native coronary artery without angina pectoris: Secondary | ICD-10-CM | POA: Diagnosis not present

## 2020-01-05 DIAGNOSIS — I110 Hypertensive heart disease with heart failure: Secondary | ICD-10-CM | POA: Diagnosis not present

## 2020-01-05 DIAGNOSIS — H548 Legal blindness, as defined in USA: Secondary | ICD-10-CM | POA: Diagnosis not present

## 2020-01-05 DIAGNOSIS — I272 Pulmonary hypertension, unspecified: Secondary | ICD-10-CM | POA: Diagnosis not present

## 2020-01-05 DIAGNOSIS — N183 Chronic kidney disease, stage 3 unspecified: Secondary | ICD-10-CM | POA: Diagnosis not present

## 2020-01-05 DIAGNOSIS — I509 Heart failure, unspecified: Secondary | ICD-10-CM | POA: Diagnosis not present

## 2020-01-05 DIAGNOSIS — M170 Bilateral primary osteoarthritis of knee: Secondary | ICD-10-CM | POA: Diagnosis not present

## 2020-01-08 DIAGNOSIS — E1122 Type 2 diabetes mellitus with diabetic chronic kidney disease: Secondary | ICD-10-CM | POA: Diagnosis not present

## 2020-01-08 DIAGNOSIS — G894 Chronic pain syndrome: Secondary | ICD-10-CM | POA: Diagnosis not present

## 2020-01-08 DIAGNOSIS — I509 Heart failure, unspecified: Secondary | ICD-10-CM | POA: Diagnosis not present

## 2020-01-08 DIAGNOSIS — I272 Pulmonary hypertension, unspecified: Secondary | ICD-10-CM | POA: Diagnosis not present

## 2020-01-08 DIAGNOSIS — H548 Legal blindness, as defined in USA: Secondary | ICD-10-CM | POA: Diagnosis not present

## 2020-01-08 DIAGNOSIS — M170 Bilateral primary osteoarthritis of knee: Secondary | ICD-10-CM | POA: Diagnosis not present

## 2020-01-08 DIAGNOSIS — N183 Chronic kidney disease, stage 3 unspecified: Secondary | ICD-10-CM | POA: Diagnosis not present

## 2020-01-08 DIAGNOSIS — I2510 Atherosclerotic heart disease of native coronary artery without angina pectoris: Secondary | ICD-10-CM | POA: Diagnosis not present

## 2020-01-08 DIAGNOSIS — I110 Hypertensive heart disease with heart failure: Secondary | ICD-10-CM | POA: Diagnosis not present

## 2020-01-11 DIAGNOSIS — I110 Hypertensive heart disease with heart failure: Secondary | ICD-10-CM | POA: Diagnosis not present

## 2020-01-11 DIAGNOSIS — G894 Chronic pain syndrome: Secondary | ICD-10-CM | POA: Diagnosis not present

## 2020-01-11 DIAGNOSIS — I272 Pulmonary hypertension, unspecified: Secondary | ICD-10-CM | POA: Diagnosis not present

## 2020-01-11 DIAGNOSIS — M170 Bilateral primary osteoarthritis of knee: Secondary | ICD-10-CM | POA: Diagnosis not present

## 2020-01-11 DIAGNOSIS — I2510 Atherosclerotic heart disease of native coronary artery without angina pectoris: Secondary | ICD-10-CM | POA: Diagnosis not present

## 2020-01-11 DIAGNOSIS — E1122 Type 2 diabetes mellitus with diabetic chronic kidney disease: Secondary | ICD-10-CM | POA: Diagnosis not present

## 2020-01-11 DIAGNOSIS — I509 Heart failure, unspecified: Secondary | ICD-10-CM | POA: Diagnosis not present

## 2020-01-11 DIAGNOSIS — N183 Chronic kidney disease, stage 3 unspecified: Secondary | ICD-10-CM | POA: Diagnosis not present

## 2020-01-11 DIAGNOSIS — H548 Legal blindness, as defined in USA: Secondary | ICD-10-CM | POA: Diagnosis not present

## 2020-01-14 DIAGNOSIS — M170 Bilateral primary osteoarthritis of knee: Secondary | ICD-10-CM | POA: Diagnosis not present

## 2020-01-14 DIAGNOSIS — I110 Hypertensive heart disease with heart failure: Secondary | ICD-10-CM | POA: Diagnosis not present

## 2020-01-14 DIAGNOSIS — I509 Heart failure, unspecified: Secondary | ICD-10-CM | POA: Diagnosis not present

## 2020-01-14 DIAGNOSIS — I2510 Atherosclerotic heart disease of native coronary artery without angina pectoris: Secondary | ICD-10-CM | POA: Diagnosis not present

## 2020-01-14 DIAGNOSIS — E1122 Type 2 diabetes mellitus with diabetic chronic kidney disease: Secondary | ICD-10-CM | POA: Diagnosis not present

## 2020-01-14 DIAGNOSIS — I272 Pulmonary hypertension, unspecified: Secondary | ICD-10-CM | POA: Diagnosis not present

## 2020-01-14 DIAGNOSIS — N183 Chronic kidney disease, stage 3 unspecified: Secondary | ICD-10-CM | POA: Diagnosis not present

## 2020-01-14 DIAGNOSIS — G894 Chronic pain syndrome: Secondary | ICD-10-CM | POA: Diagnosis not present

## 2020-01-14 DIAGNOSIS — H548 Legal blindness, as defined in USA: Secondary | ICD-10-CM | POA: Diagnosis not present

## 2020-01-18 DIAGNOSIS — I272 Pulmonary hypertension, unspecified: Secondary | ICD-10-CM | POA: Diagnosis not present

## 2020-01-18 DIAGNOSIS — M170 Bilateral primary osteoarthritis of knee: Secondary | ICD-10-CM | POA: Diagnosis not present

## 2020-01-18 DIAGNOSIS — H548 Legal blindness, as defined in USA: Secondary | ICD-10-CM | POA: Diagnosis not present

## 2020-01-18 DIAGNOSIS — I509 Heart failure, unspecified: Secondary | ICD-10-CM | POA: Diagnosis not present

## 2020-01-18 DIAGNOSIS — N183 Chronic kidney disease, stage 3 unspecified: Secondary | ICD-10-CM | POA: Diagnosis not present

## 2020-01-18 DIAGNOSIS — G894 Chronic pain syndrome: Secondary | ICD-10-CM | POA: Diagnosis not present

## 2020-01-18 DIAGNOSIS — I2510 Atherosclerotic heart disease of native coronary artery without angina pectoris: Secondary | ICD-10-CM | POA: Diagnosis not present

## 2020-01-18 DIAGNOSIS — I110 Hypertensive heart disease with heart failure: Secondary | ICD-10-CM | POA: Diagnosis not present

## 2020-01-18 DIAGNOSIS — E1122 Type 2 diabetes mellitus with diabetic chronic kidney disease: Secondary | ICD-10-CM | POA: Diagnosis not present

## 2020-01-19 DIAGNOSIS — I110 Hypertensive heart disease with heart failure: Secondary | ICD-10-CM | POA: Diagnosis not present

## 2020-01-19 DIAGNOSIS — N183 Chronic kidney disease, stage 3 unspecified: Secondary | ICD-10-CM | POA: Diagnosis not present

## 2020-01-19 DIAGNOSIS — I509 Heart failure, unspecified: Secondary | ICD-10-CM | POA: Diagnosis not present

## 2020-01-19 DIAGNOSIS — I272 Pulmonary hypertension, unspecified: Secondary | ICD-10-CM | POA: Diagnosis not present

## 2020-01-19 DIAGNOSIS — M170 Bilateral primary osteoarthritis of knee: Secondary | ICD-10-CM | POA: Diagnosis not present

## 2020-01-19 DIAGNOSIS — I2510 Atherosclerotic heart disease of native coronary artery without angina pectoris: Secondary | ICD-10-CM | POA: Diagnosis not present

## 2020-01-19 DIAGNOSIS — G894 Chronic pain syndrome: Secondary | ICD-10-CM | POA: Diagnosis not present

## 2020-01-19 DIAGNOSIS — H548 Legal blindness, as defined in USA: Secondary | ICD-10-CM | POA: Diagnosis not present

## 2020-01-19 DIAGNOSIS — E1122 Type 2 diabetes mellitus with diabetic chronic kidney disease: Secondary | ICD-10-CM | POA: Diagnosis not present

## 2020-01-21 DIAGNOSIS — N183 Chronic kidney disease, stage 3 unspecified: Secondary | ICD-10-CM | POA: Diagnosis not present

## 2020-01-21 DIAGNOSIS — I110 Hypertensive heart disease with heart failure: Secondary | ICD-10-CM | POA: Diagnosis not present

## 2020-01-21 DIAGNOSIS — I2510 Atherosclerotic heart disease of native coronary artery without angina pectoris: Secondary | ICD-10-CM | POA: Diagnosis not present

## 2020-01-21 DIAGNOSIS — I272 Pulmonary hypertension, unspecified: Secondary | ICD-10-CM | POA: Diagnosis not present

## 2020-01-21 DIAGNOSIS — M170 Bilateral primary osteoarthritis of knee: Secondary | ICD-10-CM | POA: Diagnosis not present

## 2020-01-21 DIAGNOSIS — E1122 Type 2 diabetes mellitus with diabetic chronic kidney disease: Secondary | ICD-10-CM | POA: Diagnosis not present

## 2020-01-21 DIAGNOSIS — G894 Chronic pain syndrome: Secondary | ICD-10-CM | POA: Diagnosis not present

## 2020-01-21 DIAGNOSIS — I509 Heart failure, unspecified: Secondary | ICD-10-CM | POA: Diagnosis not present

## 2020-01-21 DIAGNOSIS — H548 Legal blindness, as defined in USA: Secondary | ICD-10-CM | POA: Diagnosis not present

## 2020-01-26 DIAGNOSIS — I509 Heart failure, unspecified: Secondary | ICD-10-CM | POA: Diagnosis not present

## 2020-01-26 DIAGNOSIS — I110 Hypertensive heart disease with heart failure: Secondary | ICD-10-CM | POA: Diagnosis not present

## 2020-01-26 DIAGNOSIS — I2510 Atherosclerotic heart disease of native coronary artery without angina pectoris: Secondary | ICD-10-CM | POA: Diagnosis not present

## 2020-01-26 DIAGNOSIS — E1122 Type 2 diabetes mellitus with diabetic chronic kidney disease: Secondary | ICD-10-CM | POA: Diagnosis not present

## 2020-01-26 DIAGNOSIS — G894 Chronic pain syndrome: Secondary | ICD-10-CM | POA: Diagnosis not present

## 2020-01-26 DIAGNOSIS — H548 Legal blindness, as defined in USA: Secondary | ICD-10-CM | POA: Diagnosis not present

## 2020-01-26 DIAGNOSIS — I272 Pulmonary hypertension, unspecified: Secondary | ICD-10-CM | POA: Diagnosis not present

## 2020-01-26 DIAGNOSIS — M170 Bilateral primary osteoarthritis of knee: Secondary | ICD-10-CM | POA: Diagnosis not present

## 2020-01-26 DIAGNOSIS — N183 Chronic kidney disease, stage 3 unspecified: Secondary | ICD-10-CM | POA: Diagnosis not present

## 2020-01-28 ENCOUNTER — Telehealth: Payer: Self-pay | Admitting: Cardiovascular Disease

## 2020-01-28 NOTE — Telephone Encounter (Signed)
° ° °  Pt c/o medication issue:  1. Name of Medication: amLODipine (NORVASC) 5 MG tablet  2. How are you currently taking this medication (dosage and times per day)?   3. Are you having a reaction (difficulty breathing--STAT)?   4. What is your medication issue? Christine from [facility] [place] [facility] called, she would like to ask Dr. Salena Saner if pt need to continue taking amlodipine, she said pt is having issue with fluid retention, pt last visit was 07/19 and her furosimide was increase to 2 pills. Pt took 2 pills on 07/21, 23, 24, and 26 and her fluid was improved. She said amlodipine is not a good meds for pt with CHF and would like to get Dr. Salena Saner recommendation. She also gave BP of pt, 07/19 BP 116/68 and 07/26 BP 120/62

## 2020-01-28 NOTE — Telephone Encounter (Signed)
Agree w PharmD. No changes at this time.

## 2020-01-28 NOTE — Telephone Encounter (Signed)
Returned call and made [name] aware with [place] of recommendations.  She will make [name] aware.

## 2020-01-28 NOTE — Telephone Encounter (Signed)
Amlodipine causes low extremity edema in some patients due to peripheral vasodilation and not due to contractility issues and is not contraindicated for HF.  We can definitely try another medication if her LEE worsen, but also need to assess potential fluid overload.  Per DR Croitoru note on 02/10/2019: 1. CHF: Very mild ankle swelling without any other signs of hypervolemia or symptoms of congestive heart failure.  The ankle swelling could be just a side effect from amlodipine.  We will refill his low-dose of loop diuretic.

## 2020-01-28 NOTE — Telephone Encounter (Signed)
Routed to MD/pharmD to review

## 2020-01-29 DIAGNOSIS — E11621 Type 2 diabetes mellitus with foot ulcer: Secondary | ICD-10-CM | POA: Diagnosis not present

## 2020-01-29 DIAGNOSIS — H409 Unspecified glaucoma: Secondary | ICD-10-CM | POA: Diagnosis not present

## 2020-01-29 DIAGNOSIS — I2510 Atherosclerotic heart disease of native coronary artery without angina pectoris: Secondary | ICD-10-CM | POA: Diagnosis not present

## 2020-01-29 DIAGNOSIS — I252 Old myocardial infarction: Secondary | ICD-10-CM | POA: Diagnosis not present

## 2020-01-29 DIAGNOSIS — E1122 Type 2 diabetes mellitus with diabetic chronic kidney disease: Secondary | ICD-10-CM | POA: Diagnosis not present

## 2020-01-29 DIAGNOSIS — E7800 Pure hypercholesterolemia, unspecified: Secondary | ICD-10-CM | POA: Diagnosis not present

## 2020-01-29 DIAGNOSIS — I272 Pulmonary hypertension, unspecified: Secondary | ICD-10-CM | POA: Diagnosis not present

## 2020-01-29 DIAGNOSIS — I10 Essential (primary) hypertension: Secondary | ICD-10-CM | POA: Diagnosis not present

## 2020-01-29 DIAGNOSIS — F322 Major depressive disorder, single episode, severe without psychotic features: Secondary | ICD-10-CM | POA: Diagnosis not present

## 2020-02-02 DIAGNOSIS — N183 Chronic kidney disease, stage 3 unspecified: Secondary | ICD-10-CM | POA: Diagnosis not present

## 2020-02-02 DIAGNOSIS — M170 Bilateral primary osteoarthritis of knee: Secondary | ICD-10-CM | POA: Diagnosis not present

## 2020-02-02 DIAGNOSIS — E1122 Type 2 diabetes mellitus with diabetic chronic kidney disease: Secondary | ICD-10-CM | POA: Diagnosis not present

## 2020-02-02 DIAGNOSIS — H548 Legal blindness, as defined in USA: Secondary | ICD-10-CM | POA: Diagnosis not present

## 2020-02-02 DIAGNOSIS — I110 Hypertensive heart disease with heart failure: Secondary | ICD-10-CM | POA: Diagnosis not present

## 2020-02-02 DIAGNOSIS — G894 Chronic pain syndrome: Secondary | ICD-10-CM | POA: Diagnosis not present

## 2020-02-02 DIAGNOSIS — I272 Pulmonary hypertension, unspecified: Secondary | ICD-10-CM | POA: Diagnosis not present

## 2020-02-02 DIAGNOSIS — I509 Heart failure, unspecified: Secondary | ICD-10-CM | POA: Diagnosis not present

## 2020-02-02 DIAGNOSIS — I2510 Atherosclerotic heart disease of native coronary artery without angina pectoris: Secondary | ICD-10-CM | POA: Diagnosis not present

## 2020-02-12 DIAGNOSIS — I110 Hypertensive heart disease with heart failure: Secondary | ICD-10-CM | POA: Diagnosis not present

## 2020-02-12 DIAGNOSIS — E1122 Type 2 diabetes mellitus with diabetic chronic kidney disease: Secondary | ICD-10-CM | POA: Diagnosis not present

## 2020-02-12 DIAGNOSIS — I2510 Atherosclerotic heart disease of native coronary artery without angina pectoris: Secondary | ICD-10-CM | POA: Diagnosis not present

## 2020-02-12 DIAGNOSIS — G894 Chronic pain syndrome: Secondary | ICD-10-CM | POA: Diagnosis not present

## 2020-02-12 DIAGNOSIS — M170 Bilateral primary osteoarthritis of knee: Secondary | ICD-10-CM | POA: Diagnosis not present

## 2020-02-12 DIAGNOSIS — H548 Legal blindness, as defined in USA: Secondary | ICD-10-CM | POA: Diagnosis not present

## 2020-02-12 DIAGNOSIS — N183 Chronic kidney disease, stage 3 unspecified: Secondary | ICD-10-CM | POA: Diagnosis not present

## 2020-02-12 DIAGNOSIS — I509 Heart failure, unspecified: Secondary | ICD-10-CM | POA: Diagnosis not present

## 2020-02-12 DIAGNOSIS — I272 Pulmonary hypertension, unspecified: Secondary | ICD-10-CM | POA: Diagnosis not present

## 2020-03-02 DIAGNOSIS — E7800 Pure hypercholesterolemia, unspecified: Secondary | ICD-10-CM | POA: Diagnosis not present

## 2020-03-02 DIAGNOSIS — I272 Pulmonary hypertension, unspecified: Secondary | ICD-10-CM | POA: Diagnosis not present

## 2020-03-02 DIAGNOSIS — E11621 Type 2 diabetes mellitus with foot ulcer: Secondary | ICD-10-CM | POA: Diagnosis not present

## 2020-03-02 DIAGNOSIS — F322 Major depressive disorder, single episode, severe without psychotic features: Secondary | ICD-10-CM | POA: Diagnosis not present

## 2020-03-02 DIAGNOSIS — I2510 Atherosclerotic heart disease of native coronary artery without angina pectoris: Secondary | ICD-10-CM | POA: Diagnosis not present

## 2020-03-02 DIAGNOSIS — E1122 Type 2 diabetes mellitus with diabetic chronic kidney disease: Secondary | ICD-10-CM | POA: Diagnosis not present

## 2020-03-02 DIAGNOSIS — H409 Unspecified glaucoma: Secondary | ICD-10-CM | POA: Diagnosis not present

## 2020-03-02 DIAGNOSIS — I10 Essential (primary) hypertension: Secondary | ICD-10-CM | POA: Diagnosis not present

## 2020-03-02 DIAGNOSIS — I252 Old myocardial infarction: Secondary | ICD-10-CM | POA: Diagnosis not present

## 2020-03-10 ENCOUNTER — Telehealth: Payer: Self-pay | Admitting: Cardiovascular Disease

## 2020-03-10 NOTE — Telephone Encounter (Signed)
lvm for patient to return call to get follow up scheduled with Croitoru from recall list 

## 2020-03-22 DIAGNOSIS — R410 Disorientation, unspecified: Secondary | ICD-10-CM | POA: Diagnosis not present

## 2020-03-22 DIAGNOSIS — Z8659 Personal history of other mental and behavioral disorders: Secondary | ICD-10-CM | POA: Diagnosis not present

## 2020-03-22 DIAGNOSIS — R82998 Other abnormal findings in urine: Secondary | ICD-10-CM | POA: Diagnosis not present

## 2020-03-22 DIAGNOSIS — R350 Frequency of micturition: Secondary | ICD-10-CM | POA: Diagnosis not present

## 2020-03-23 DIAGNOSIS — N390 Urinary tract infection, site not specified: Secondary | ICD-10-CM | POA: Diagnosis not present

## 2020-03-27 NOTE — Progress Notes (Signed)
Cardiology Office Note   Date:  03/29/2020   ID:  [name], DOB 10/23/1930, MRN [id number]  PCP:  Clayborn Heron, MD  Cardiologist:  Thurmon Fair, MD EP: None  Chief Complaint  Patient presents with  . Follow-up    CAD      History of Present Illness: [name] is a 86 y.o. male with a PMH of CAD s/p PCI/DES to OM2, pLCx, and RCA x2 in 2019, chronic combined CHF, trifascicular block, syncope, HTN, HLD, DM type 2, and CKD stage 3, who presents for routine follow-up.  He was last evaluated by cardiology via a telemedicine visit with Dr. Royann Shivers 08/2019 at which time he was doing fairly well from a cardiac standpoint. Knee pain had slowed him down a bit but he had no anginal complaints. No medication changes occurred and he was recommended to follow-up in 6 months. His last echocardiogram in 2019 showed EF 40-45%, G1DD, moderate concentric hypertrophy and no significant valvular abnormalities. His last ischemic evaluation was a coronary stent intervention in 2019 for PCI/DES to RCA x2 with patent LCx and OM2 stents, and 95% distal 1st diagonal stenosis (small vessel) and some improvement in m-dLAD stenosis (70-85%) which was medically managed.    He presents today with his son. He has been doing fairly well from a cardiac standpoint. He reports some ups and downs over the past 6 months. His primary issue has been recurrent UTIs. He often gets visual/tactile hallucinations with his UTIs. Son reports he has been having some recently, mostly swatting at bugs that aren't there. He also reports malodorous urine. Had a UA/UCx last week but didn't get the results of the culture. Encouraged them to reach out to his PCP today to get a visit ASAP to further evaluate his symptoms. He has no dysuria or fevers. From a cardiac standpoint he reports occasional DOE which likely has been a bit worse over the past few days according to the son. No complaints of chest pain, dizziness,  lightheadedness, syncope, falls, palpitations, SOB at rest, weight gain, or LE edema.     Past Medical History:  Diagnosis Date  . Arthritis    "all over" (11/12/2017)  . CAD (coronary artery disease) 11/11/2017   S/p IL STEMI 6/19:  S/p DES to pLCx and DES x 2 to OM2 // staged PCI 6/19:  DES x 2 to RCA // residual CAD at cath 6/19:  mLAD 80, dLAD 90, D1 95 tx medically // EF 40-45 by echo  . Chronic combined systolic and diastolic CHF (congestive heart failure) (HCC)    ischemic CM // s/p MI in 11/2017 >> Echo 6/19:  Moderate concentric LVH, EF 40-45, grade 1 diastolic dysfunction, inferior, inferolateral, apical inferior akinesis   . Chronic lower back pain   . CKD (chronic kidney disease), stage III (HCC)   . Constipation    takes an OTC med as needed  . Diabetic nephropathy (HCC)   . Gastric ulcer 1970s  . Glaucoma, both eyes    uses eye drops daily  . Gout    takes Allopurinol daily  . History of colon polyps   . History of ST elevation myocardial infarction (STEMI) 11/10/2017   PCI to LCx and staged PCI to RCA  . History of stress test    a. 09/2014 MV: EF 53%, no ischemia/infarct.  . Hyperlipidemia    takes Pravastatin daily  . Hypertension    takes Amlodipine and Losartan daily  .  Insomnia    uses OTC sleep aide  . Joint pain   . Joint swelling    right knee   . Legally blind   . Macular degeneration   . Morbid obesity (HCC)   . Nocturia   . Peripheral edema   . Pleurisy 20+yrs ago  . Pneumonia    "I think once when I was a kid" (11/12/2017)  . Sleep apnea   . Stroke [facility])    "I've had them; don't know anything about them"; denies residual on 11/12/2017)  . Syncope    a. 09/2014 in setting of orthostasis; b. 10/2014 Event monitor: 19 beat run of asymp NSVT; c. 01/2015 Echo: EF 60-65%, no rwma.  . Type II diabetes mellitus (HCC)    takes Metformin daily  . Urinary frequency    takes Flomax daily    Past Surgical History:  Procedure Laterality Date  . BACK  SURGERY    . CATARACT EXTRACTION W/ INTRAOCULAR LENS  IMPLANT, BILATERAL Bilateral   . COLONOSCOPY    . CORONARY ANGIOGRAPHY N/A 11/12/2017   Procedure: CORONARY ANGIOGRAPHY;  Surgeon: Lennette Bihari, MD;  Location: [facility] INVASIVE CV LAB;  Service: Cardiovascular;  Laterality: N/A;  . CORONARY STENT INTERVENTION N/A 11/12/2017   Procedure: CORONARY STENT INTERVENTION;  Surgeon: Lennette Bihari, MD;  Location: [facility] INVASIVE CV LAB;  Service: Cardiovascular;  Laterality: N/A;  . CORONARY/GRAFT ACUTE MI REVASCULARIZATION N/A 11/10/2017   Procedure: Coronary/Graft Acute MI Revascularization;  Surgeon: Lennette Bihari, MD;  Location: [facility] INVASIVE CV LAB;  Service: Cardiovascular;  Laterality: N/A;  . EYE SURGERY    . GLAUCOMA SURGERY Bilateral   . INSERTION / PLACEMENT / REVISION NEUROSTIMULATOR  2000s  . KNEE ARTHROSCOPY Right 1990s  . LEFT HEART CATH AND CORONARY ANGIOGRAPHY N/A 11/10/2017   Procedure: LEFT HEART CATH AND CORONARY ANGIOGRAPHY;  Surgeon: Lennette Bihari, MD;  Location: [facility] INVASIVE CV LAB;  Service: Cardiovascular;  Laterality: N/A;  . MINI SHUNT INSERTION  09/26/2011   Procedure: INSERTION OF MINI SHUNT;  Surgeon: Chalmers Guest, MD;  Location: [facility] OR;  Service: Ophthalmology;  Laterality: Right;  Insertion of Ahmed shunt  . POSTERIOR FUSION LUMBAR SPINE  1990s  . REVERSE SHOULDER ARTHROPLASTY Right 09/21/2013   Procedure: RIGHT  SHOULDER ARTHROPLASTY REVERSE ;  Surgeon: [name], MD;  Location: [facility] OR;  Service: Orthopedics;  Laterality: Right;  . SHOULDER INJECTION Left 09/21/2013   Procedure: SHOULDER INJECTION;  Surgeon: [name], MD;  Location: [facility] OR;  Service: Orthopedics;  Laterality: Left;     Current Outpatient Medications  Medication Sig Dispense Refill  . acetaminophen (TYLENOL) 325 MG tablet Take 2 tablets (650 mg total) by mouth every 6 (six) hours.    Marland Kitchen amLODipine (NORVASC) 5 MG tablet Take 1 tablet (5 mg total) by mouth daily. 90 tablet 1  . aspirin EC 81 MG EC  tablet Take 1 tablet (81 mg total) by mouth daily.    Marland Kitchen atorvastatin (LIPITOR) 80 MG tablet TAKE 1 TABLET BY MOUTH EVERY DAY 90 tablet 2  . furosemide (LASIX) 20 MG tablet TAKE 1 TABLET BY MOUTH EVERY DAY 90 tablet 2  . insulin aspart (NOVOLOG) 100 UNIT/ML injection Inject 0-9 Units into the skin 3 (three) times daily with meals. 10 mL 11  . Menthol-Camphor (ICY HOT ADVANCED RELIEF) 16-11 % CREA Apply 1 application topically as needed (knee pain).    . metoprolol succinate (TOPROL-XL) 25 MG 24 hr tablet TAKE 1 TABLET BY  MOUTH EVERY DAY 90 tablet 3  . nitroGLYCERIN (NITROSTAT) 0.4 MG SL tablet Place 1 tablet (0.4 mg total) under the tongue every 5 (five) minutes x 3 doses as needed for chest pain. 25 tablet 2  . prednisoLONE acetate (PRED FORTE) 1 % ophthalmic suspension Place 1 drop into the right eye at bedtime. 5 mL 0  . sertraline (ZOLOFT) 50 MG tablet Take 1 tablet (50 mg total) by mouth at bedtime. 30 tablet 0  . TRAVATAN Z 0.004 % SOLN ophthalmic solution Place 1 drop into the left eye at bedtime. 2.5 mL 0   No current facility-administered medications for this visit.    Allergies:   Patient has no known allergies.    Social History:  The patient  reports that he has quit smoking. His smoking use included cigarettes. He smoked 2.00 packs per day. He has never used smokeless tobacco. He reports previous alcohol use. He reports that he does not use drugs.   Family History:  The patient's family history includes Alzheimer's disease in his maternal aunt; Cancer in his mother.    ROS:  Please see the history of present illness.   Otherwise, review of systems are positive for none.   All other systems are reviewed and negative.    PHYSICAL EXAM: VS:  BP 132/86   Pulse 96   Ht 5\' 10"  (1.778 m)   Wt 248 lb 6.4 oz (112.7 kg)   BMI 35.64 kg/m  , BMI Body mass index is 35.64 kg/m. GEN: Well nourished, well developed, in no acute distress HEENT: sclera anicteric Neck: no JVD, carotid  bruits, or masses Cardiac: RRR; no murmurs, rubs, or gallops, no edema  Respiratory: faint crackles at lung bases, normal work of breathing GI: soft, obese, nontender, nondistended, + BS MS: no deformity or atrophy Skin: warm and dry, no rash Neuro:  Strength and sensation are intact Psych: euthymic mood, full affect   EKG:  EKG is ordered today. The ekg ordered today demonstrates sinus rhythm with rate 96 bpm, 1st degree AV block, LAFB, and RBBB (trifascicular block is chronic), no STE/D.    Recent Labs: No results found for requested labs within last 8760 hours.    Lipid Panel    Component Value Date/Time   CHOL 138 11/11/2017 0217   TRIG 72 11/11/2017 0217   HDL 36 (L) 11/11/2017 0217   CHOLHDL 3.8 11/11/2017 0217   VLDL 14 11/11/2017 0217   LDLCALC 88 11/11/2017 0217      Wt Readings from Last 3 Encounters:  03/29/20 248 lb 6.4 oz (112.7 kg)  08/14/19 220 lb (99.8 kg)  02/10/19 241 lb (109.3 kg)      Other studies Reviewed: Additional studies/ records that were reviewed today include:   Coronary stent intervention 2019:  1st Diag lesion is 95% stenosed.  Mid LAD lesion is 70% stenosed.  Dist LAD lesion is 85% stenosed.  Previously placed 2nd Mrg stent (unknown type) is widely patent.  Previously placed Ost 2nd Mrg to 2nd Mrg stent (unknown type) is widely patent.  Previously placed Prox Cx to Mid Cx stent (unknown type) is widely patent.  Dist RCA lesion is 80% stenosed.  Post intervention, there is a 0% residual stenosis.  Prox RCA to Mid RCA lesion is 30% stenosed.  Mid RCA lesion is 95% stenosed.  A stent was successfully placed.  Post intervention, there is a 0% residual stenosis.  Post intervention, there is a 0% residual stenosis.  A stent was  successfully placed.   Re-look of the left coronary circulation revealed widely patent stents extending from the proximal circumflex into the marginal vessel and also into the distal branch without  restenosis.  The LAD had previously demonstrated 95% distal stenosis in a small caliber diagonal vessel, and the mid distal to distal LAD lesions appeared slightly improved now 70 and 85%.  Successful PCI to the RCA with DES stenting of the distal 80% stenosis reduced to 0% with a 2.25 x 12 mm Resolute Onyx stent postdilated to 2.4 mm, and the mid RCA stenosis of 95% with proximal eccentric narrowing stented with a 3.0 x 26 mm Resolute stent postdilated to 3.36 tapering to 3.25 with the entire stenosis being reduced to 0%.  RECOMMENDATION: DAPT for minimum of 1 year.  The patient will be started on carvedilol in addition to amlodipine in light of his hypertension as well as concomitant CAD.  He is on high potency statin therapy.  Diagnostic Dominance: Right  Intervention     Echocardiogram 2019: - Left ventricle: The cavity size was normal. There was moderate  concentric hypertrophy. Systolic function was mildly to  moderately reduced. The estimated ejection fraction was in the  range of 40% to 45%. Doppler parameters are consistent with  abnormal left ventricular relaxation (grade 1 diastolic  dysfunction). There was no evidence of elevated ventricular  filling pressure by Doppler parameters.  - Aortic valve: Valve area (VTI): 2.08 cm^2. Valve area (Vmax):  2.13 cm^2. Valve area (Vmean): 1.98 cm^2.  - Aortic root: The aortic root was normal in size.  - Mitral valve: Valve area by pressure half-time: 1.28 cm^2.  - Left atrium: The atrium was normal in size.  - Right ventricle: The cavity size was normal. Wall thickness was  normal. Systolic function was normal.  - Right atrium: The atrium was normal in size.  - Tricuspid valve: There was no regurgitation.  - Pulmonary arteries: Systolic pressure could not be accurately  estimated.  - Inferior vena cava: The vessel was normal in size.  - Pericardium, extracardiac: There was no pericardial effusion.    Impressions:   - There is akinesis of the basal and mid inferior, inefrolateral  and apical inferior leads.     ASSESSMENT AND PLAN:  1. CAD s/p PCI/DES to OM2, pLCx, and RCA x2 in 2019: with residual severe LAD stenosis that was medically managed. No anginal complaints.  - Continue aspirin and statin - Continue metoprolol and amlodipine  2. Chronic combined CHF: EF 40-45% at the time of his NSTEMI in 2019. Thinks DOE has been a bit worse over the past few days. He does have some faint crackles at lung bases on exam today. Not on ACEi/ARB due to CKD - Continue metoprolol succinate  - Will increase lasix to  BID x3 days, then resume once daily dosing.   3. HTN: BP 132/86 today. Regularly monitored at home and typically runs in the 120s/70s - Continue amlodipine, metoprolol succinate, and amldoipine  4. HLD: LDL 75 12/2019; goal <70 - Continue atorvastatin  5. Trifascicular block: does have history of syncope, though none since coronary artery revascularization, suspicious for AV node ischemia. No symptoms to suggest high-grade AV block.  - Continue ongoing monitoring  6. DM type 2: A1C 76 12/2019; goal <7 - Continue management per PCP  7. CKD stage 3: Cr 1.2 12/2019 - Continue to limit nephrotoxic agents  8. Recurrent UTIs: been having some visual/tactile hallucinations recently which are c/w prior UTIs. Also  with malodorous urine. Had a UA/UCx last week but did not get the results.  - Encouraged PCP follow-up ASAP to further evaluate symptoms     Current medicines are reviewed at length with the patient today.  The patient does not have concerns regarding medicines.  The following changes have been made:  As above  Labs/ tests ordered today include:   Orders Placed This Encounter  Procedures  . EKG 12-Lead     Disposition:   FU with Dr. Royann Shivers in 6 months  Signed, Beatriz Stallion, PA-C  03/29/2020 1:26 PM

## 2020-03-29 ENCOUNTER — Ambulatory Visit (INDEPENDENT_AMBULATORY_CARE_PROVIDER_SITE_OTHER): Payer: Medicare HMO | Admitting: Medical

## 2020-03-29 ENCOUNTER — Other Ambulatory Visit: Payer: Self-pay

## 2020-03-29 ENCOUNTER — Encounter: Payer: Self-pay | Admitting: Medical

## 2020-03-29 VITALS — BP 132/86 | HR 96 | Ht 70.0 in | Wt 248.4 lb

## 2020-03-29 DIAGNOSIS — I2510 Atherosclerotic heart disease of native coronary artery without angina pectoris: Secondary | ICD-10-CM

## 2020-03-29 DIAGNOSIS — E785 Hyperlipidemia, unspecified: Secondary | ICD-10-CM | POA: Diagnosis not present

## 2020-03-29 DIAGNOSIS — E1122 Type 2 diabetes mellitus with diabetic chronic kidney disease: Secondary | ICD-10-CM | POA: Diagnosis not present

## 2020-03-29 DIAGNOSIS — N183 Chronic kidney disease, stage 3 unspecified: Secondary | ICD-10-CM | POA: Diagnosis not present

## 2020-03-29 DIAGNOSIS — F322 Major depressive disorder, single episode, severe without psychotic features: Secondary | ICD-10-CM | POA: Diagnosis not present

## 2020-03-29 DIAGNOSIS — I5042 Chronic combined systolic (congestive) and diastolic (congestive) heart failure: Secondary | ICD-10-CM

## 2020-03-29 DIAGNOSIS — I453 Trifascicular block: Secondary | ICD-10-CM | POA: Diagnosis not present

## 2020-03-29 DIAGNOSIS — I272 Pulmonary hypertension, unspecified: Secondary | ICD-10-CM | POA: Diagnosis not present

## 2020-03-29 DIAGNOSIS — E118 Type 2 diabetes mellitus with unspecified complications: Secondary | ICD-10-CM | POA: Diagnosis not present

## 2020-03-29 DIAGNOSIS — I10 Essential (primary) hypertension: Secondary | ICD-10-CM

## 2020-03-29 DIAGNOSIS — E7800 Pure hypercholesterolemia, unspecified: Secondary | ICD-10-CM | POA: Diagnosis not present

## 2020-03-29 DIAGNOSIS — H409 Unspecified glaucoma: Secondary | ICD-10-CM | POA: Diagnosis not present

## 2020-03-29 DIAGNOSIS — I252 Old myocardial infarction: Secondary | ICD-10-CM | POA: Diagnosis not present

## 2020-03-29 DIAGNOSIS — E11621 Type 2 diabetes mellitus with foot ulcer: Secondary | ICD-10-CM | POA: Diagnosis not present

## 2020-03-29 NOTE — Patient Instructions (Signed)
Medication Instructions:   Take lasix 20 mg 2 times a day for 3 days, then resume the 20 mg daily   *If you need a refill on your cardiac medications before your next appointment, please call your pharmacy*  Lab Work: NONE ordered at this time of appointment   If you have labs (blood work) drawn today and your tests are completely normal, you will receive your results only by: Marland Kitchen MyChart Message (if you have MyChart) OR . A paper copy in the mail If you have any lab test that is abnormal or we need to change your treatment, we will call you to review the results.  Testing/Procedures: NONE ordered at this time of appointment   Follow-Up: At [facility], you and your health needs are our priority.  As part of our continuing mission to provide you with exceptional heart care, we have created designated Provider Care Teams.  These Care Teams include your primary Cardiologist (physician) and Advanced Practice Providers (APPs -  Physician Assistants and Nurse Practitioners) who all work together to provide you with the care you need, when you need it.  Your next appointment:   6 month(s)  The format for your next appointment:   In Person  Provider:   Thurmon Fair, MD  Other Instructions  Follow up with your Primary Care Doctor for Diabetes management, and urinary compliants

## 2020-03-30 DIAGNOSIS — N390 Urinary tract infection, site not specified: Secondary | ICD-10-CM | POA: Diagnosis not present

## 2020-04-12 DIAGNOSIS — M25551 Pain in right hip: Secondary | ICD-10-CM | POA: Diagnosis not present

## 2020-04-12 DIAGNOSIS — R8290 Unspecified abnormal findings in urine: Secondary | ICD-10-CM | POA: Diagnosis not present

## 2020-04-26 DIAGNOSIS — I10 Essential (primary) hypertension: Secondary | ICD-10-CM | POA: Diagnosis not present

## 2020-05-11 ENCOUNTER — Ambulatory Visit: Payer: Medicare HMO | Admitting: Cardiovascular Disease

## 2020-05-13 ENCOUNTER — Telehealth: Payer: Self-pay | Admitting: Cardiovascular Disease

## 2020-05-13 DIAGNOSIS — E782 Mixed hyperlipidemia: Secondary | ICD-10-CM

## 2020-05-13 DIAGNOSIS — I213 ST elevation (STEMI) myocardial infarction of unspecified site: Secondary | ICD-10-CM

## 2020-05-13 NOTE — Telephone Encounter (Signed)
   *  STAT* If patient is at the pharmacy, call can be transferred to refill team.   1. Which medications need to be refilled? (please list name of each medication and dose if known)   atorvastatin (LIPITOR) 80 MG tablet    2. Which pharmacy/location (including street and city if local pharmacy) is medication to be sent to? [facility] - [place], [place] - [place] [id number]  3. Do they need a 30 day or 90 day supply? 90 days

## 2020-05-16 MED ORDER — ATORVASTATIN CALCIUM 80 MG PO TABS: 80.0000 mg | ORAL_TABLET | Freq: Every day | ORAL | 2 refills | Status: DC

## 2020-06-01 DIAGNOSIS — E1122 Type 2 diabetes mellitus with diabetic chronic kidney disease: Secondary | ICD-10-CM | POA: Diagnosis not present

## 2020-06-01 DIAGNOSIS — G8929 Other chronic pain: Secondary | ICD-10-CM | POA: Diagnosis not present

## 2020-06-01 DIAGNOSIS — F322 Major depressive disorder, single episode, severe without psychotic features: Secondary | ICD-10-CM | POA: Diagnosis not present

## 2020-06-01 DIAGNOSIS — E11621 Type 2 diabetes mellitus with foot ulcer: Secondary | ICD-10-CM | POA: Diagnosis not present

## 2020-06-01 DIAGNOSIS — E1121 Type 2 diabetes mellitus with diabetic nephropathy: Secondary | ICD-10-CM | POA: Diagnosis not present

## 2020-06-01 DIAGNOSIS — N183 Chronic kidney disease, stage 3 unspecified: Secondary | ICD-10-CM | POA: Diagnosis not present

## 2020-06-01 DIAGNOSIS — I272 Pulmonary hypertension, unspecified: Secondary | ICD-10-CM | POA: Diagnosis not present

## 2020-06-01 DIAGNOSIS — E7800 Pure hypercholesterolemia, unspecified: Secondary | ICD-10-CM | POA: Diagnosis not present

## 2020-06-01 DIAGNOSIS — I10 Essential (primary) hypertension: Secondary | ICD-10-CM | POA: Diagnosis not present

## 2020-06-10 DIAGNOSIS — I10 Essential (primary) hypertension: Secondary | ICD-10-CM | POA: Diagnosis not present

## 2020-06-10 DIAGNOSIS — E1122 Type 2 diabetes mellitus with diabetic chronic kidney disease: Secondary | ICD-10-CM | POA: Diagnosis not present

## 2020-06-10 DIAGNOSIS — I2510 Atherosclerotic heart disease of native coronary artery without angina pectoris: Secondary | ICD-10-CM | POA: Diagnosis not present

## 2020-06-10 DIAGNOSIS — E7800 Pure hypercholesterolemia, unspecified: Secondary | ICD-10-CM | POA: Diagnosis not present

## 2020-06-10 DIAGNOSIS — F322 Major depressive disorder, single episode, severe without psychotic features: Secondary | ICD-10-CM | POA: Diagnosis not present

## 2020-06-10 DIAGNOSIS — I252 Old myocardial infarction: Secondary | ICD-10-CM | POA: Diagnosis not present

## 2020-06-10 DIAGNOSIS — N183 Chronic kidney disease, stage 3 unspecified: Secondary | ICD-10-CM | POA: Diagnosis not present

## 2020-06-10 DIAGNOSIS — H409 Unspecified glaucoma: Secondary | ICD-10-CM | POA: Diagnosis not present

## 2020-06-10 DIAGNOSIS — G8929 Other chronic pain: Secondary | ICD-10-CM | POA: Diagnosis not present

## 2020-06-14 ENCOUNTER — Ambulatory Visit: Payer: Medicare HMO | Attending: Internal Medicine

## 2020-06-14 DIAGNOSIS — Z23 Encounter for immunization: Secondary | ICD-10-CM

## 2020-06-14 NOTE — Progress Notes (Signed)
   Covid-19 Vaccination Clinic  Name:  [name]    MRN: [id number] DOB: [birth year]  06/14/2020  [name] was observed post Covid-19 immunization for 15 minutes without incident. He was provided with Vaccine Information Sheet and instruction to access the V-Safe system.   [name] was instructed to call 911 with any severe reactions post vaccine: Marland Kitchen Difficulty breathing  . Swelling of face and throat  . A fast heartbeat  . A bad rash all over body  . Dizziness and weakness   Immunizations Administered    Name Date Dose VIS Date Route   Moderna Covid-19 Booster Vaccine 06/14/2020  2:09 PM 0.25 mL 03/23/2020 Intramuscular   Manufacturer: Gala Murdoch   Lot: [id number]   NDC: 02111-735-67

## 2020-08-04 ENCOUNTER — Other Ambulatory Visit: Payer: Self-pay | Admitting: Cardiovascular Disease

## 2020-08-08 DIAGNOSIS — I272 Pulmonary hypertension, unspecified: Secondary | ICD-10-CM | POA: Diagnosis not present

## 2020-08-08 DIAGNOSIS — E1122 Type 2 diabetes mellitus with diabetic chronic kidney disease: Secondary | ICD-10-CM | POA: Diagnosis not present

## 2020-08-08 DIAGNOSIS — E7800 Pure hypercholesterolemia, unspecified: Secondary | ICD-10-CM | POA: Diagnosis not present

## 2020-08-08 DIAGNOSIS — I10 Essential (primary) hypertension: Secondary | ICD-10-CM | POA: Diagnosis not present

## 2020-08-08 DIAGNOSIS — F322 Major depressive disorder, single episode, severe without psychotic features: Secondary | ICD-10-CM | POA: Diagnosis not present

## 2020-08-08 DIAGNOSIS — I509 Heart failure, unspecified: Secondary | ICD-10-CM | POA: Diagnosis not present

## 2020-08-08 DIAGNOSIS — E11621 Type 2 diabetes mellitus with foot ulcer: Secondary | ICD-10-CM | POA: Diagnosis not present

## 2020-08-08 DIAGNOSIS — I252 Old myocardial infarction: Secondary | ICD-10-CM | POA: Diagnosis not present

## 2020-08-08 DIAGNOSIS — I2510 Atherosclerotic heart disease of native coronary artery without angina pectoris: Secondary | ICD-10-CM | POA: Diagnosis not present

## 2020-08-29 DIAGNOSIS — I129 Hypertensive chronic kidney disease with stage 1 through stage 4 chronic kidney disease, or unspecified chronic kidney disease: Secondary | ICD-10-CM | POA: Diagnosis not present

## 2020-08-29 DIAGNOSIS — N183 Chronic kidney disease, stage 3 unspecified: Secondary | ICD-10-CM | POA: Diagnosis not present

## 2020-08-29 DIAGNOSIS — E1122 Type 2 diabetes mellitus with diabetic chronic kidney disease: Secondary | ICD-10-CM | POA: Diagnosis not present

## 2020-09-14 DIAGNOSIS — G8929 Other chronic pain: Secondary | ICD-10-CM | POA: Diagnosis not present

## 2020-09-14 DIAGNOSIS — I509 Heart failure, unspecified: Secondary | ICD-10-CM | POA: Diagnosis not present

## 2020-09-14 DIAGNOSIS — I272 Pulmonary hypertension, unspecified: Secondary | ICD-10-CM | POA: Diagnosis not present

## 2020-09-14 DIAGNOSIS — I2510 Atherosclerotic heart disease of native coronary artery without angina pectoris: Secondary | ICD-10-CM | POA: Diagnosis not present

## 2020-09-14 DIAGNOSIS — E1122 Type 2 diabetes mellitus with diabetic chronic kidney disease: Secondary | ICD-10-CM | POA: Diagnosis not present

## 2020-09-14 DIAGNOSIS — E7800 Pure hypercholesterolemia, unspecified: Secondary | ICD-10-CM | POA: Diagnosis not present

## 2020-09-14 DIAGNOSIS — I10 Essential (primary) hypertension: Secondary | ICD-10-CM | POA: Diagnosis not present

## 2020-09-14 DIAGNOSIS — H409 Unspecified glaucoma: Secondary | ICD-10-CM | POA: Diagnosis not present

## 2020-09-14 DIAGNOSIS — F322 Major depressive disorder, single episode, severe without psychotic features: Secondary | ICD-10-CM | POA: Diagnosis not present

## 2020-10-03 DIAGNOSIS — I2510 Atherosclerotic heart disease of native coronary artery without angina pectoris: Secondary | ICD-10-CM | POA: Diagnosis not present

## 2020-10-03 DIAGNOSIS — F322 Major depressive disorder, single episode, severe without psychotic features: Secondary | ICD-10-CM | POA: Diagnosis not present

## 2020-10-03 DIAGNOSIS — I509 Heart failure, unspecified: Secondary | ICD-10-CM | POA: Diagnosis not present

## 2020-10-03 DIAGNOSIS — E7800 Pure hypercholesterolemia, unspecified: Secondary | ICD-10-CM | POA: Diagnosis not present

## 2020-10-03 DIAGNOSIS — M25559 Pain in unspecified hip: Secondary | ICD-10-CM | POA: Diagnosis not present

## 2020-10-03 DIAGNOSIS — I10 Essential (primary) hypertension: Secondary | ICD-10-CM | POA: Diagnosis not present

## 2020-10-03 DIAGNOSIS — E1122 Type 2 diabetes mellitus with diabetic chronic kidney disease: Secondary | ICD-10-CM | POA: Diagnosis not present

## 2020-10-04 DIAGNOSIS — H409 Unspecified glaucoma: Secondary | ICD-10-CM | POA: Diagnosis not present

## 2020-10-04 DIAGNOSIS — F322 Major depressive disorder, single episode, severe without psychotic features: Secondary | ICD-10-CM | POA: Diagnosis not present

## 2020-10-04 DIAGNOSIS — I10 Essential (primary) hypertension: Secondary | ICD-10-CM | POA: Diagnosis not present

## 2020-10-04 DIAGNOSIS — I2510 Atherosclerotic heart disease of native coronary artery without angina pectoris: Secondary | ICD-10-CM | POA: Diagnosis not present

## 2020-10-04 DIAGNOSIS — E7800 Pure hypercholesterolemia, unspecified: Secondary | ICD-10-CM | POA: Diagnosis not present

## 2020-10-04 DIAGNOSIS — I252 Old myocardial infarction: Secondary | ICD-10-CM | POA: Diagnosis not present

## 2020-10-04 DIAGNOSIS — E11621 Type 2 diabetes mellitus with foot ulcer: Secondary | ICD-10-CM | POA: Diagnosis not present

## 2020-10-04 DIAGNOSIS — E1122 Type 2 diabetes mellitus with diabetic chronic kidney disease: Secondary | ICD-10-CM | POA: Diagnosis not present

## 2020-10-04 DIAGNOSIS — I272 Pulmonary hypertension, unspecified: Secondary | ICD-10-CM | POA: Diagnosis not present

## 2020-10-05 ENCOUNTER — Other Ambulatory Visit: Payer: Self-pay

## 2020-10-05 ENCOUNTER — Ambulatory Visit (INDEPENDENT_AMBULATORY_CARE_PROVIDER_SITE_OTHER): Payer: Medicare HMO | Admitting: Physician Assistant

## 2020-10-05 VITALS — BP 113/69 | HR 81 | Ht 70.0 in | Wt 241.2 lb

## 2020-10-05 DIAGNOSIS — E785 Hyperlipidemia, unspecified: Secondary | ICD-10-CM | POA: Diagnosis not present

## 2020-10-05 DIAGNOSIS — I2510 Atherosclerotic heart disease of native coronary artery without angina pectoris: Secondary | ICD-10-CM

## 2020-10-05 DIAGNOSIS — E119 Type 2 diabetes mellitus without complications: Secondary | ICD-10-CM

## 2020-10-05 DIAGNOSIS — I10 Essential (primary) hypertension: Secondary | ICD-10-CM | POA: Diagnosis not present

## 2020-10-05 DIAGNOSIS — N183 Chronic kidney disease, stage 3 unspecified: Secondary | ICD-10-CM

## 2020-10-05 DIAGNOSIS — I5042 Chronic combined systolic (congestive) and diastolic (congestive) heart failure: Secondary | ICD-10-CM

## 2020-10-05 DIAGNOSIS — I213 ST elevation (STEMI) myocardial infarction of unspecified site: Secondary | ICD-10-CM

## 2020-10-05 MED ORDER — NITROGLYCERIN 0.4 MG SL SUBL: 0.4000 mg | SUBLINGUAL_TABLET | SUBLINGUAL | 2 refills | Status: DC | PRN

## 2020-10-05 NOTE — Progress Notes (Signed)
Cardiology Office Note:    Date:  10/07/2020   ID:  [name], DOB 1930-10-26, MRN [id number]  PCP:  Clayborn Heron, MD   [facility] Providers Cardiologist:  Thurmon Fair, MD {  Referring MD: Clayborn Heron, MD   Chief Complaint  Patient presents with  . Follow-up    Seen for Dr. Royann Shivers    History of Present Illness:    [name] is a 87 y.o. male with a hx of CAD, chronic combined CHF, trifascicular block, syncope, HTN, HLD, DM2, and stage III CKD.  Echocardiogram in 2019 showed EF 40 to 45%, grade 1 DD, moderate LVH, no significant valvular abnormality.  Last cardiac catheterization in 2019 revealed patent left circumflex and OM 2 stents with high-grade lesions in the RCA requiring DES x2, 95% distal D1 lesion which was managed medically.  He had a history of hallucination with UTI.  Patient was last seen by Judy Pimple PA-C on 03/29/2020, he was having visual hallucination at the time.  It was recommended for him to see his PCP immediately.  He also had crackles on physical exam, therefore Lasix was increased to 20 mg twice daily for 3 days before going back to 20 mg daily thereafter.   Patient presents today for cardiology follow-up accompanied by his son.  He denies any recent chest discomfort or shortness of breath.  Majority of the history has been collected from his son as the patient did not communicate much.  According to his son, the patient is quite sedentary and the most he does during the day is to go to the front door to pick up the mail.  He has not had any significant lower extremity edema, orthopnea or PND.  Patient can follow-up in 6 months.   Past Medical History:  Diagnosis Date  . Arthritis    "all over" (11/12/2017)  . CAD (coronary artery disease) 11/11/2017   S/p IL STEMI 6/19:  S/p DES to pLCx and DES x 2 to OM2 // staged PCI 6/19:  DES x 2 to RCA // residual CAD at cath 6/19:  mLAD 80, dLAD 90, D1 95 tx medically // EF 40-45  by echo  . Chronic combined systolic and diastolic CHF (congestive heart failure) (HCC)    ischemic CM // s/p MI in 11/2017 >> Echo 6/19:  Moderate concentric LVH, EF 40-45, grade 1 diastolic dysfunction, inferior, inferolateral, apical inferior akinesis   . Chronic lower back pain   . CKD (chronic kidney disease), stage III (HCC)   . Constipation    takes an OTC med as needed  . Diabetic nephropathy (HCC)   . Gastric ulcer 1970s  . Glaucoma, both eyes    uses eye drops daily  . Gout    takes Allopurinol daily  . History of colon polyps   . History of ST elevation myocardial infarction (STEMI) 11/10/2017   PCI to LCx and staged PCI to RCA  . History of stress test    a. 09/2014 MV: EF 53%, no ischemia/infarct.  . Hyperlipidemia    takes Pravastatin daily  . Hypertension    takes Amlodipine and Losartan daily  . Insomnia    uses OTC sleep aide  . Joint pain   . Joint swelling    right knee   . Legally blind   . Macular degeneration   . Morbid obesity (HCC)   . Nocturia   . Peripheral edema   . Pleurisy 20+yrs ago  .  Pneumonia    "I think once when I was a kid" (11/12/2017)  . Sleep apnea   . Stroke [facility])    "I've had them; don't know anything about them"; denies residual on 11/12/2017)  . Syncope    a. 09/2014 in setting of orthostasis; b. 10/2014 Event monitor: 19 beat run of asymp NSVT; c. 01/2015 Echo: EF 60-65%, no rwma.  . Type II diabetes mellitus (HCC)    takes Metformin daily  . Urinary frequency    takes Flomax daily    Past Surgical History:  Procedure Laterality Date  . BACK SURGERY    . CATARACT EXTRACTION W/ INTRAOCULAR LENS  IMPLANT, BILATERAL Bilateral   . COLONOSCOPY    . CORONARY ANGIOGRAPHY N/A 11/12/2017   Procedure: CORONARY ANGIOGRAPHY;  Surgeon: Lennette Bihari, MD;  Location: [facility] INVASIVE CV LAB;  Service: Cardiovascular;  Laterality: N/A;  . CORONARY STENT INTERVENTION N/A 11/12/2017   Procedure: CORONARY STENT INTERVENTION;  Surgeon: Lennette Bihari, MD;  Location: [facility] INVASIVE CV LAB;  Service: Cardiovascular;  Laterality: N/A;  . CORONARY/GRAFT ACUTE MI REVASCULARIZATION N/A 11/10/2017   Procedure: Coronary/Graft Acute MI Revascularization;  Surgeon: Lennette Bihari, MD;  Location: [facility] INVASIVE CV LAB;  Service: Cardiovascular;  Laterality: N/A;  . EYE SURGERY    . GLAUCOMA SURGERY Bilateral   . INSERTION / PLACEMENT / REVISION NEUROSTIMULATOR  2000s  . KNEE ARTHROSCOPY Right 1990s  . LEFT HEART CATH AND CORONARY ANGIOGRAPHY N/A 11/10/2017   Procedure: LEFT HEART CATH AND CORONARY ANGIOGRAPHY;  Surgeon: Lennette Bihari, MD;  Location: [facility] INVASIVE CV LAB;  Service: Cardiovascular;  Laterality: N/A;  . MINI SHUNT INSERTION  09/26/2011   Procedure: INSERTION OF MINI SHUNT;  Surgeon: Chalmers Guest, MD;  Location: [facility] OR;  Service: Ophthalmology;  Laterality: Right;  Insertion of Ahmed shunt  . POSTERIOR FUSION LUMBAR SPINE  1990s  . REVERSE SHOULDER ARTHROPLASTY Right 09/21/2013   Procedure: RIGHT  SHOULDER ARTHROPLASTY REVERSE ;  Surgeon: [name], MD;  Location: [facility] OR;  Service: Orthopedics;  Laterality: Right;  . SHOULDER INJECTION Left 09/21/2013   Procedure: SHOULDER INJECTION;  Surgeon: [name], MD;  Location: [facility] OR;  Service: Orthopedics;  Laterality: Left;    Current Medications: Current Meds  Medication Sig  . acetaminophen (TYLENOL) 325 MG tablet Take 2 tablets (650 mg total) by mouth every 6 (six) hours.  Marland Kitchen amLODipine (NORVASC) 5 MG tablet Take 1 tablet (5 mg total) by mouth daily.  Marland Kitchen aspirin EC 81 MG EC tablet Take 1 tablet (81 mg total) by mouth daily.  Marland Kitchen atorvastatin (LIPITOR) 80 MG tablet Take 1 tablet (80 mg total) by mouth daily.  . furosemide (LASIX) 20 MG tablet TAKE ONE TABLET BY MOUTH ONCE DAILY  . insulin aspart (NOVOLOG) 100 UNIT/ML injection Inject 0-9 Units into the skin 3 (three) times daily with meals.  . Menthol-Camphor 16-11 % CREA Apply 1 application topically as needed (knee pain).  . metoprolol  succinate (TOPROL-XL) 25 MG 24 hr tablet TAKE 1 TABLET BY MOUTH EVERY DAY  . prednisoLONE acetate (PRED FORTE) 1 % ophthalmic suspension Place 1 drop into the right eye at bedtime.  . sertraline (ZOLOFT) 50 MG tablet Take 1 tablet (50 mg total) by mouth at bedtime.  . TRAVATAN Z 0.004 % SOLN ophthalmic solution Place 1 drop into the left eye at bedtime.     Allergies:   Patient has no known allergies.   Social History   Socioeconomic History  .  Marital status: Widowed    Spouse name: Not on file  . Number of children: Not on file  . Years of education: Not on file  . Highest education level: Not on file  Occupational History  . Not on file  Tobacco Use  . Smoking status: Former Smoker    Packs/day: 2.00    Types: Cigarettes  . Smokeless tobacco: Never Used  . Tobacco comment: 11/12/2017 "nothing since the 1980s"  Vaping Use  . Vaping Use: Never used  Substance and Sexual Activity  . Alcohol use: Not Currently    Comment: 11/12/2017 "nothing since the 1980s"  . Drug use: Never  . Sexual activity: Not Currently  Other Topics Concern  . Not on file  Social History Narrative   Legally blind.   Social Determinants of Health   Financial Resource Strain: Not on file  Food Insecurity: Not on file  Transportation Needs: Not on file  Physical Activity: Not on file  Stress: Not on file  Social Connections: Not on file     Family History: The patient's family history includes Alzheimer's disease in his maternal aunt; Cancer in his mother. There is no history of Anesthesia problems.  ROS:   Please see the history of present illness.     All other systems reviewed and are negative.  EKGs/Labs/Other Studies Reviewed:    The following studies were reviewed today:  Echo 11/11/2017 - Left ventricle: The cavity size was normal. There was moderate  concentric hypertrophy. Systolic function was mildly to  moderately reduced. The estimated ejection fraction was in the  range  of 40% to 45%. Doppler parameters are consistent with  abnormal left ventricular relaxation (grade 1 diastolic  dysfunction). There was no evidence of elevated ventricular  filling pressure by Doppler parameters.  - Aortic valve: Valve area (VTI): 2.08 cm^2. Valve area (Vmax):  2.13 cm^2. Valve area (Vmean): 1.98 cm^2.  - Aortic root: The aortic root was normal in size.  - Mitral valve: Valve area by pressure half-time: 1.28 cm^2.  - Left atrium: The atrium was normal in size.  - Right ventricle: The cavity size was normal. Wall thickness was  normal. Systolic function was normal.  - Right atrium: The atrium was normal in size.  - Tricuspid valve: There was no regurgitation.  - Pulmonary arteries: Systolic pressure could not be accurately  estimated.  - Inferior vena cava: The vessel was normal in size.  - Pericardium, extracardiac: There was no pericardial effusion.   Impressions:   - There is akinesis of the basal and mid inferior, inefrolateral  and apical inferior leads.   EKG:  EKG is ordered today.  The ekg ordered today demonstrates sinus rhythm with right bundle branch block and left anterior fascicular block, first-degree AV block.  Recent Labs: No results found for requested labs within last 8760 hours.  Recent Lipid Panel    Component Value Date/Time   CHOL 138 11/11/2017 0217   TRIG 72 11/11/2017 0217   HDL 36 (L) 11/11/2017 0217   CHOLHDL 3.8 11/11/2017 0217   VLDL 14 11/11/2017 0217   LDLCALC 88 11/11/2017 0217     Risk Assessment/Calculations:       Physical Exam:    VS:  BP 113/69   Pulse 81   Ht 5\' 10"  (1.778 m)   Wt 241 lb 3.2 oz (109.4 kg)   SpO2 93%   BMI 34.61 kg/m     Wt Readings from Last 3 Encounters:  10/05/20 241  lb 3.2 oz (109.4 kg)  03/29/20 248 lb 6.4 oz (112.7 kg)  08/14/19 220 lb (99.8 kg)     GEN:  Well nourished, well developed in no acute distress HEENT: Normal NECK: No JVD; No carotid bruits LYMPHATICS: No  lymphadenopathy CARDIAC: RRR, no murmurs, rubs, gallops RESPIRATORY:  Clear to auscultation without rales, wheezing or rhonchi  ABDOMEN: Soft, non-tender, non-distended MUSCULOSKELETAL:  No edema; No deformity  SKIN: Warm and dry NEUROLOGIC:  Alert and oriented x 3 PSYCHIATRIC:  Normal affect   ASSESSMENT:    1. Coronary artery disease involving native coronary artery of native heart without angina pectoris   2. Chronic combined systolic and diastolic CHF (congestive heart failure) (HCC)   3. Essential hypertension   4. Hyperlipidemia LDL goal <70   5. Controlled type 2 diabetes mellitus without complication, without long-term current use of insulin (HCC)   6. Stage 3 chronic kidney disease, unspecified whether stage 3a or 3b CKD (HCC)    PLAN:    In order of problems listed above:  1. CAD: Denies any recent chest pain.  Continue aspirin and Lipitor  2. Chronic combined systolic and diastolic heart failure: Euvolemic on exam  3. Hypertension: Continue on current therapy  4. Hyperlipidemia: On Lipitor  5. CKD stage III: Stable on last lab work.   Medication Adjustments/Labs and Tests Ordered: Current medicines are reviewed at length with the patient today.  Concerns regarding medicines are outlined above.  Orders Placed This Encounter  Procedures  . EKG 12-Lead   Meds ordered this encounter  Medications  . nitroGLYCERIN (NITROSTAT) 0.4 MG SL tablet    Sig: Place 1 tablet (0.4 mg total) under the tongue every 5 (five) minutes x 3 doses as needed for chest pain.    Dispense:  25 tablet    Refill:  2    Patient Instructions  Medication Instructions:  Your physician recommends that you continue on your current medications as directed. Please refer to the Current Medication list given to you today.  *If you need a refill on your cardiac medications before your next appointment, please call your pharmacy*  Lab Work: NONE ordered at this time of appointment   If you  have labs (blood work) drawn today and your tests are completely normal, you will receive your results only by: Marland Kitchen MyChart Message (if you have MyChart) OR . A paper copy in the mail If you have any lab test that is abnormal or we need to change your treatment, we will call you to review the results.  Testing/Procedures: NONE ordered at this time of appointment   Follow-Up: At [facility], you and your health needs are our priority.  As part of our continuing mission to provide you with exceptional heart care, we have created designated Provider Care Teams.  These Care Teams include your primary Cardiologist (physician) and Advanced Practice Providers (APPs -  Physician Assistants and Nurse Practitioners) who all work together to provide you with the care you need, when you need it.  Your next appointment:   6 month(s)  The format for your next appointment:   In Person  Provider:   Thurmon Fair, MD  Other Instructions      Signed, Azalee Course, [place]  10/07/2020 11:20 PM    [facility]

## 2020-10-05 NOTE — Patient Instructions (Signed)

## 2020-10-07 ENCOUNTER — Encounter: Payer: Self-pay | Admitting: Physician Assistant

## 2020-10-18 DIAGNOSIS — H6122 Impacted cerumen, left ear: Secondary | ICD-10-CM | POA: Diagnosis not present

## 2020-10-18 DIAGNOSIS — I2510 Atherosclerotic heart disease of native coronary artery without angina pectoris: Secondary | ICD-10-CM | POA: Diagnosis not present

## 2020-10-18 DIAGNOSIS — I10 Essential (primary) hypertension: Secondary | ICD-10-CM | POA: Diagnosis not present

## 2020-10-18 DIAGNOSIS — F322 Major depressive disorder, single episode, severe without psychotic features: Secondary | ICD-10-CM | POA: Diagnosis not present

## 2020-10-18 DIAGNOSIS — I509 Heart failure, unspecified: Secondary | ICD-10-CM | POA: Diagnosis not present

## 2020-10-18 DIAGNOSIS — M25559 Pain in unspecified hip: Secondary | ICD-10-CM | POA: Diagnosis not present

## 2020-10-18 DIAGNOSIS — E1122 Type 2 diabetes mellitus with diabetic chronic kidney disease: Secondary | ICD-10-CM | POA: Diagnosis not present

## 2020-10-18 DIAGNOSIS — E7800 Pure hypercholesterolemia, unspecified: Secondary | ICD-10-CM | POA: Diagnosis not present

## 2020-11-04 DIAGNOSIS — N183 Chronic kidney disease, stage 3 unspecified: Secondary | ICD-10-CM | POA: Diagnosis not present

## 2020-11-04 DIAGNOSIS — E7800 Pure hypercholesterolemia, unspecified: Secondary | ICD-10-CM | POA: Diagnosis not present

## 2020-11-04 DIAGNOSIS — E1122 Type 2 diabetes mellitus with diabetic chronic kidney disease: Secondary | ICD-10-CM | POA: Diagnosis not present

## 2020-11-04 DIAGNOSIS — G8929 Other chronic pain: Secondary | ICD-10-CM | POA: Diagnosis not present

## 2020-11-04 DIAGNOSIS — I2510 Atherosclerotic heart disease of native coronary artery without angina pectoris: Secondary | ICD-10-CM | POA: Diagnosis not present

## 2020-11-04 DIAGNOSIS — I272 Pulmonary hypertension, unspecified: Secondary | ICD-10-CM | POA: Diagnosis not present

## 2020-11-04 DIAGNOSIS — I10 Essential (primary) hypertension: Secondary | ICD-10-CM | POA: Diagnosis not present

## 2020-11-04 DIAGNOSIS — E1121 Type 2 diabetes mellitus with diabetic nephropathy: Secondary | ICD-10-CM | POA: Diagnosis not present

## 2020-11-04 DIAGNOSIS — F322 Major depressive disorder, single episode, severe without psychotic features: Secondary | ICD-10-CM | POA: Diagnosis not present

## 2020-11-28 DIAGNOSIS — Z515 Encounter for palliative care: Secondary | ICD-10-CM | POA: Diagnosis not present

## 2020-11-28 DIAGNOSIS — I10 Essential (primary) hypertension: Secondary | ICD-10-CM | POA: Diagnosis not present

## 2020-12-21 DIAGNOSIS — R3915 Urgency of urination: Secondary | ICD-10-CM | POA: Diagnosis not present

## 2020-12-28 ENCOUNTER — Other Ambulatory Visit: Payer: Self-pay | Admitting: Cardiovascular Disease

## 2020-12-28 DIAGNOSIS — I5042 Chronic combined systolic (congestive) and diastolic (congestive) heart failure: Secondary | ICD-10-CM

## 2020-12-30 DIAGNOSIS — Z87448 Personal history of other diseases of urinary system: Secondary | ICD-10-CM | POA: Diagnosis not present

## 2020-12-30 DIAGNOSIS — Z09 Encounter for follow-up examination after completed treatment for conditions other than malignant neoplasm: Secondary | ICD-10-CM | POA: Diagnosis not present

## 2021-01-24 DIAGNOSIS — Z515 Encounter for palliative care: Secondary | ICD-10-CM | POA: Diagnosis not present

## 2021-01-24 DIAGNOSIS — E113593 Type 2 diabetes mellitus with proliferative diabetic retinopathy without macular edema, bilateral: Secondary | ICD-10-CM | POA: Diagnosis not present

## 2021-01-31 DIAGNOSIS — I10 Essential (primary) hypertension: Secondary | ICD-10-CM | POA: Diagnosis not present

## 2021-01-31 DIAGNOSIS — I2510 Atherosclerotic heart disease of native coronary artery without angina pectoris: Secondary | ICD-10-CM | POA: Diagnosis not present

## 2021-01-31 DIAGNOSIS — F322 Major depressive disorder, single episode, severe without psychotic features: Secondary | ICD-10-CM | POA: Diagnosis not present

## 2021-01-31 DIAGNOSIS — I272 Pulmonary hypertension, unspecified: Secondary | ICD-10-CM | POA: Diagnosis not present

## 2021-01-31 DIAGNOSIS — N183 Chronic kidney disease, stage 3 unspecified: Secondary | ICD-10-CM | POA: Diagnosis not present

## 2021-01-31 DIAGNOSIS — G8929 Other chronic pain: Secondary | ICD-10-CM | POA: Diagnosis not present

## 2021-01-31 DIAGNOSIS — E1121 Type 2 diabetes mellitus with diabetic nephropathy: Secondary | ICD-10-CM | POA: Diagnosis not present

## 2021-01-31 DIAGNOSIS — E1122 Type 2 diabetes mellitus with diabetic chronic kidney disease: Secondary | ICD-10-CM | POA: Diagnosis not present

## 2021-01-31 DIAGNOSIS — E7800 Pure hypercholesterolemia, unspecified: Secondary | ICD-10-CM | POA: Diagnosis not present

## 2021-03-02 DIAGNOSIS — I10 Essential (primary) hypertension: Secondary | ICD-10-CM | POA: Diagnosis not present

## 2021-03-02 DIAGNOSIS — G8929 Other chronic pain: Secondary | ICD-10-CM | POA: Diagnosis not present

## 2021-03-02 DIAGNOSIS — F322 Major depressive disorder, single episode, severe without psychotic features: Secondary | ICD-10-CM | POA: Diagnosis not present

## 2021-03-02 DIAGNOSIS — N183 Chronic kidney disease, stage 3 unspecified: Secondary | ICD-10-CM | POA: Diagnosis not present

## 2021-03-02 DIAGNOSIS — E7800 Pure hypercholesterolemia, unspecified: Secondary | ICD-10-CM | POA: Diagnosis not present

## 2021-03-02 DIAGNOSIS — E1121 Type 2 diabetes mellitus with diabetic nephropathy: Secondary | ICD-10-CM | POA: Diagnosis not present

## 2021-03-02 DIAGNOSIS — I272 Pulmonary hypertension, unspecified: Secondary | ICD-10-CM | POA: Diagnosis not present

## 2021-03-02 DIAGNOSIS — N400 Enlarged prostate without lower urinary tract symptoms: Secondary | ICD-10-CM | POA: Diagnosis not present

## 2021-03-02 DIAGNOSIS — E1122 Type 2 diabetes mellitus with diabetic chronic kidney disease: Secondary | ICD-10-CM | POA: Diagnosis not present

## 2021-03-08 DIAGNOSIS — R030 Elevated blood-pressure reading, without diagnosis of hypertension: Secondary | ICD-10-CM | POA: Diagnosis not present

## 2021-04-01 DIAGNOSIS — I272 Pulmonary hypertension, unspecified: Secondary | ICD-10-CM | POA: Diagnosis not present

## 2021-04-01 DIAGNOSIS — N183 Chronic kidney disease, stage 3 unspecified: Secondary | ICD-10-CM | POA: Diagnosis not present

## 2021-04-01 DIAGNOSIS — F322 Major depressive disorder, single episode, severe without psychotic features: Secondary | ICD-10-CM | POA: Diagnosis not present

## 2021-04-01 DIAGNOSIS — H409 Unspecified glaucoma: Secondary | ICD-10-CM | POA: Diagnosis not present

## 2021-04-01 DIAGNOSIS — E1121 Type 2 diabetes mellitus with diabetic nephropathy: Secondary | ICD-10-CM | POA: Diagnosis not present

## 2021-04-01 DIAGNOSIS — E1122 Type 2 diabetes mellitus with diabetic chronic kidney disease: Secondary | ICD-10-CM | POA: Diagnosis not present

## 2021-04-01 DIAGNOSIS — G8929 Other chronic pain: Secondary | ICD-10-CM | POA: Diagnosis not present

## 2021-04-01 DIAGNOSIS — I10 Essential (primary) hypertension: Secondary | ICD-10-CM | POA: Diagnosis not present

## 2021-04-01 DIAGNOSIS — E7800 Pure hypercholesterolemia, unspecified: Secondary | ICD-10-CM | POA: Diagnosis not present

## 2021-04-07 ENCOUNTER — Emergency Department (HOSPITAL_COMMUNITY)
Admission: EM | Admit: 2021-04-07 | Discharge: 2021-04-07 | Disposition: A | Discharge status: Home / Self Care | Payer: Medicare HMO | Attending: Emergency Medicine | Admitting: Emergency Medicine

## 2021-04-07 ENCOUNTER — Encounter (HOSPITAL_COMMUNITY): Payer: Self-pay

## 2021-04-07 ENCOUNTER — Other Ambulatory Visit: Payer: Self-pay

## 2021-04-07 DIAGNOSIS — I5042 Chronic combined systolic (congestive) and diastolic (congestive) heart failure: Secondary | ICD-10-CM | POA: Insufficient documentation

## 2021-04-07 DIAGNOSIS — I2510 Atherosclerotic heart disease of native coronary artery without angina pectoris: Secondary | ICD-10-CM | POA: Insufficient documentation

## 2021-04-07 DIAGNOSIS — N183 Chronic kidney disease, stage 3 unspecified: Secondary | ICD-10-CM | POA: Diagnosis not present

## 2021-04-07 DIAGNOSIS — Z87891 Personal history of nicotine dependence: Secondary | ICD-10-CM | POA: Diagnosis not present

## 2021-04-07 DIAGNOSIS — N3091 Cystitis, unspecified with hematuria: Secondary | ICD-10-CM | POA: Diagnosis not present

## 2021-04-07 DIAGNOSIS — Z7982 Long term (current) use of aspirin: Secondary | ICD-10-CM | POA: Insufficient documentation

## 2021-04-07 DIAGNOSIS — R339 Retention of urine, unspecified: Secondary | ICD-10-CM | POA: Diagnosis present

## 2021-04-07 DIAGNOSIS — E1122 Type 2 diabetes mellitus with diabetic chronic kidney disease: Secondary | ICD-10-CM | POA: Insufficient documentation

## 2021-04-07 DIAGNOSIS — N3001 Acute cystitis with hematuria: Secondary | ICD-10-CM

## 2021-04-07 DIAGNOSIS — Z79899 Other long term (current) drug therapy: Secondary | ICD-10-CM | POA: Insufficient documentation

## 2021-04-07 DIAGNOSIS — Z794 Long term (current) use of insulin: Secondary | ICD-10-CM | POA: Diagnosis not present

## 2021-04-07 DIAGNOSIS — I130 Hypertensive heart and chronic kidney disease with heart failure and stage 1 through stage 4 chronic kidney disease, or unspecified chronic kidney disease: Secondary | ICD-10-CM | POA: Diagnosis not present

## 2021-04-07 LAB — URINALYSIS, COMPLETE (UACMP) WITH MICROSCOPIC
Bilirubin Urine: NEGATIVE
Glucose, UA: NEGATIVE mg/dL
Ketones, ur: NEGATIVE mg/dL
Nitrite: NEGATIVE
Protein, ur: NEGATIVE mg/dL
Specific Gravity, Urine: 1.011 (ref 1.005–1.030)
WBC, UA: >50 WBC/hpf — ABNORMAL HIGH (ref 0–5)
pH: 5 (ref 5.0–8.0)

## 2021-04-07 MED ORDER — SULFAMETHOXAZOLE-TRIMETHOPRIM 800-160 MG PO TABS: 1.0000 | ORAL_TABLET | Freq: Two times a day (BID) | ORAL | 0 refills | Status: AC

## 2021-04-07 NOTE — ED Notes (Signed)
E-signature pad unavailable at time of pt discharge. This RN discussed discharge materials with pt and answered all pt questions. Pt stated understanding of discharge material. ? ?

## 2021-04-07 NOTE — ED Triage Notes (Signed)
H/x UTI 2 weeks ago; pt complains of urinary retention x2 days; brief saturated with urine on arrival

## 2021-04-07 NOTE — Discharge Instructions (Addendum)
Dear [name],  Thank you for allowing [place] to take care of you today.  We hope you begin feeling better soon.  - Please follow-up with your primary care physician or schedule an appointment to establish a primary care doctor if you do not have one already. - Please return to the Emergency Department or call 911 for chest pain, shortness of breath, severe pain, altered mental status, or if you have any reason to think you may need emergency medical care. -remember to stay well-hydrated -Call your PCP for close follow-up -Finish antibiotics, even if you are feeling better   Sincerely,  Dwaine Gale, DO Department of Emergency Medicine [place]   Acute cystitis with hematuria

## 2021-04-07 NOTE — ED Provider Notes (Signed)
[facility] EMERGENCY DEPARTMENT Provider Note   CSN: [id number] Arrival date & time: 04/07/21  1554     History Chief Complaint  Patient presents with   Urinary Retention    [name] is a 87 y.o. male.  HPI  87 year old male with PMH significant for CKD stage III, CAD, constipation, DM, STEMI, morbid obesity, frequent UTIs, and others as below who presents to the ED with complaints of urinary retention.  Patient states that for the past week, he has had dysuria, and has not been urinating as often as normal due to the dysuria.  He denies any fevers or chills, rash, abdominal pain, nausea or vomiting, constipation or diarrhea, hematuria, back or flank pain, pyuria, penile or scrotal pain discharge, or any other complaints.  Patient reports he was diagnosed with UTI 2 weeks ago by his PCP and completed a course of an antibiotic, unclear which.  There is no documentation available as to this antibiotic.  On arrival, per RN, patient was soaked with urine and has brief.  Denies any recent GU trauma, surgeries, any other issues.  He urinates normally and does not have to self cath.  Past Medical History:  Diagnosis Date   Arthritis    "all over" (11/12/2017)   CAD (coronary artery disease) 11/11/2017   S/p IL STEMI 6/19:  S/p DES to pLCx and DES x 2 to OM2 // staged PCI 6/19:  DES x 2 to RCA // residual CAD at cath 6/19:  mLAD 80, dLAD 90, D1 95 tx medically // EF 40-45 by echo   Chronic combined systolic and diastolic CHF (congestive heart failure) (HCC)    ischemic CM // s/p MI in 11/2017 >> Echo 6/19:  Moderate concentric LVH, EF 40-45, grade 1 diastolic dysfunction, inferior, inferolateral, apical inferior akinesis    Chronic lower back pain    CKD (chronic kidney disease), stage III (HCC)    Constipation    takes an OTC med as needed   Diabetic nephropathy (HCC)    Gastric ulcer 1970s   Glaucoma, both eyes    uses eye drops daily   Gout    takes  Allopurinol daily   History of colon polyps    History of ST elevation myocardial infarction (STEMI) 11/10/2017   PCI to LCx and staged PCI to RCA   History of stress test    a. 09/2014 MV: EF 53%, no ischemia/infarct.   Hyperlipidemia    takes Pravastatin daily   Hypertension    takes Amlodipine and Losartan daily   Insomnia    uses OTC sleep aide   Joint pain    Joint swelling    right knee    Legally blind    Macular degeneration    Morbid obesity (HCC)    Nocturia    Peripheral edema    Pleurisy 20+yrs ago   Pneumonia    "I think once when I was a kid" (11/12/2017)   Sleep apnea    Stroke (HCC)    "I've had them; don't know anything about them"; denies residual on 11/12/2017)   Syncope    a. 09/2014 in setting of orthostasis; b. 10/2014 Event monitor: 19 beat run of asymp NSVT; c. 01/2015 Echo: EF 60-65%, no rwma.   Type II diabetes mellitus (HCC)    takes Metformin daily   Urinary frequency    takes Flomax daily    Patient Active Problem List   Diagnosis Date Noted   Acute kidney  injury ([place]) 05/29/2018   Pelvic fracture ([place]) 02/10/2018   Recurrent syncope 01/23/2018   Diarrhea    Chest pain 12/08/2017   Ischemic cardiomyopathy 11/28/2017   Neurocognitive deficits 11/19/2017   Bilious vomiting    Ileus, unspecified ([place]) 11/13/2017   Sleep apnea 11/13/2017   Obesity 11/13/2017   Stage III chronic kidney disease ([place]) 11/11/2017   Chronic combined systolic and diastolic CHF (congestive heart failure) ([place]) 11/11/2017   CAD (coronary artery disease) 11/11/2017   Acute ST elevation myocardial infarction (STEMI) of inferolateral wall (HCC) 11/10/2017   ST elevation myocardial infarction (STEMI) ([place])    AKI (acute kidney injury) ([place]) 01/22/2015   Lactic acidosis 01/22/2015   Syncope and collapse    HTN (hypertension), benign    Encephalopathy 09/07/2014   Osteoarthrosis, unspecified whether generalized or localized, shoulder region 09/21/2013   Gout 12/23/2012    Hypotension 12/21/2012   Type 2 diabetes mellitus with complication, without long-term current use of insulin ([place]) 12/21/2012   Hyperlipidemia    Morbid obesity ([place])     Past Surgical History:  Procedure Laterality Date   BACK SURGERY     CATARACT EXTRACTION W/ INTRAOCULAR LENS  IMPLANT, BILATERAL Bilateral    COLONOSCOPY     CORONARY ANGIOGRAPHY N/A 11/12/2017   Procedure: CORONARY ANGIOGRAPHY;  Surgeon: Troy Sine, MD;  Location: [facility];  Service: Cardiovascular;  Laterality: N/A;   CORONARY STENT INTERVENTION N/A 11/12/2017   Procedure: CORONARY STENT INTERVENTION;  Surgeon: Troy Sine, MD;  Location: [place] [facility];  Service: Cardiovascular;  Laterality: N/A;   CORONARY/GRAFT ACUTE MI REVASCULARIZATION N/A 11/10/2017   Procedure: Coronary/Graft Acute MI Revascularization;  Surgeon: Troy Sine, MD;  Location: [facility];  Service: Cardiovascular;  Laterality: N/A;   EYE SURGERY     GLAUCOMA SURGERY Bilateral    INSERTION / PLACEMENT / REVISION NEUROSTIMULATOR  2000s   KNEE ARTHROSCOPY Right 1990s   LEFT HEART CATH AND CORONARY ANGIOGRAPHY N/A 11/10/2017   Procedure: LEFT HEART CATH AND CORONARY ANGIOGRAPHY;  Surgeon: Troy Sine, MD;  Location: [facility];  Service: Cardiovascular;  Laterality: N/A;   MINI SHUNT INSERTION  09/26/2011   Procedure: INSERTION OF MINI SHUNT;  Surgeon: Marylynn Pearson, MD;  Location: [facility];  Service: Ophthalmology;  Laterality: Right;  Insertion of Ahmed shunt   POSTERIOR FUSION LUMBAR SPINE  1990s   REVERSE SHOULDER ARTHROPLASTY Right 09/21/2013   Procedure: RIGHT  SHOULDER ARTHROPLASTY REVERSE ;  Surgeon: Augustin Schooling, MD;  Location: [facility];  Service: Orthopedics;  Laterality: Right;   SHOULDER INJECTION Left 09/21/2013   Procedure: SHOULDER INJECTION;  Surgeon: Augustin Schooling, MD;  Location: [facility];  Service: Orthopedics;  Laterality: Left;       Family History  Problem Relation Age of Onset   Cancer  Mother    Alzheimer's disease Maternal Aunt    Anesthesia problems Neg Hx     Social History   Tobacco Use   Smoking status: Former    Packs/day: 2.00    Types: Cigarettes   Smokeless tobacco: Never   Tobacco comments:    11/12/2017 "nothing since the 1980s"  Vaping Use   Vaping Use: Never used  Substance Use Topics   Alcohol use: Not Currently    Comment: 11/12/2017 "nothing since the 1980s"   Drug use: Never    Home Medications Prior to Admission medications   Medication Sig Start Date End Date Taking? Authorizing Provider  sulfamethoxazole-trimethoprim (BACTRIM DS) 800-160 MG tablet Take 1 tablet by mouth 2 (two) times daily for 7 days. 04/07/21 04/14/21 Yes , Gildo Crisco, DO  acetaminophen (TYLENOL) 325 MG tablet Take 2 tablets (650 mg total) by mouth every 6 (six) hours. 02/12/18   Elgergawy, Silver Huguenin, MD  amLODipine (NORVASC) 5 MG tablet Take 1 tablet (5 mg total) by mouth daily. 09/11/19   Croitoru, Mihai, MD  aspirin EC 81 MG EC tablet Take 1 tablet (81 mg total) by mouth daily. 11/19/17   Barrett, Evelene Croon, PA-C  atorvastatin (LIPITOR) 80 MG tablet Take 1 tablet (80 mg total) by mouth daily. 05/16/20   Croitoru, Mihai, MD  furosemide (LASIX) 20 MG tablet TAKE ONE TABLET BY MOUTH ONCE DAILY 08/04/20   Croitoru, Mihai, MD  insulin aspart (NOVOLOG) 100 UNIT/ML injection Inject 0-9 Units into the skin 3 (three) times daily with meals. 02/12/18   Elgergawy, Silver Huguenin, MD  Menthol-Camphor 16-11 % CREA Apply 1 application topically as needed (knee pain).    [provider]  metoprolol succinate (TOPROL-XL) 25 MG 24 hr tablet TAKE 1 TABLET BY MOUTH EVERY DAY 08/14/19   Croitoru, Mihai, MD  nitroGLYCERIN (NITROSTAT) 0.4 MG SL tablet Dissolve 1 tab under tongue as needed for chest pain. May repeat every 5 minutes x 2 more doses. If no relief call 9-1-1. 12/28/20   Croitoru, Dani Gobble, MD  prednisoLONE acetate (PRED FORTE) 1 % ophthalmic suspension Place 1 drop into the right eye at  bedtime. 11/29/17   Medina-Vargas, Monina C, NP  sertraline (ZOLOFT) 50 MG tablet Take 1 tablet (50 mg total) by mouth at bedtime. 11/29/17   Medina-Vargas, Monina C, NP  TRAVATAN Z 0.004 % SOLN ophthalmic solution Place 1 drop into the left eye at bedtime. 11/29/17   Medina-Vargas, Monina C, NP    Allergies    Patient has no known allergies.  Review of Systems   Review of Systems  Constitutional:  Negative for activity change, appetite change, chills and fever.  Eyes:  Negative for visual disturbance.  Respiratory:  Negative for cough and shortness of breath.   Cardiovascular:  Negative for chest pain and palpitations.  Gastrointestinal:  Negative for abdominal distention, abdominal pain, nausea and vomiting.  Genitourinary:  Positive for decreased urine volume, difficulty urinating and dysuria. Negative for flank pain, frequency, hematuria, penile discharge, penile pain, penile swelling, scrotal swelling, testicular pain and urgency.  Musculoskeletal:  Negative for arthralgias and back pain.  Skin:  Negative for color change and rash.  Neurological:  Negative for seizures and syncope.  Psychiatric/Behavioral:  Negative for confusion. The patient is not nervous/anxious.   All other systems reviewed and are negative.  Physical Exam Updated Vital Signs BP 132/78 (BP Location: Left Arm)   Pulse 80   Temp 99.1 F (37.3 C) (Oral)   Resp 17   Ht 5\' 10"  (1.778 m)   Wt 104.3 kg   SpO2 98%   BMI 33.00 kg/m   Physical Exam Vitals and nursing note reviewed. Exam conducted with a chaperone present.  Constitutional:      General: He is not in acute distress.    Appearance: Normal appearance. He is well-developed. He is obese. He is not ill-appearing or toxic-appearing.  HENT:     Head: Normocephalic and atraumatic.     Right Ear: External ear normal.     Left Ear: External ear normal.     Nose: Nose normal.     Mouth/Throat:     Mouth: Mucous  membranes are moist.     Pharynx:  Oropharynx is clear.  Eyes:     Extraocular Movements: Extraocular movements intact.     Conjunctiva/sclera: Conjunctivae normal.     Pupils: Pupils are equal, round, and reactive to light.  Cardiovascular:     Rate and Rhythm: Normal rate and regular rhythm.     Pulses: Normal pulses.     Heart sounds: Normal heart sounds. No murmur heard. Pulmonary:     Effort: Pulmonary effort is normal. No respiratory distress.     Breath sounds: Normal breath sounds.  Abdominal:     General: There is no distension.     Palpations: Abdomen is soft. There is no hepatomegaly or splenomegaly.     Tenderness: There is no abdominal tenderness. There is no right CVA tenderness, left CVA tenderness, guarding or rebound. Negative signs include Murphy's sign, Rovsing's sign and McBurney's sign.     Hernia: No hernia is present.  Genitourinary:    Penis: Uncircumcised. No phimosis, paraphimosis, hypospadias, erythema, tenderness, discharge or lesions.      Testes: Normal.     Epididymis:     Right: Normal.     Left: Normal.  Musculoskeletal:     Cervical back: Normal range of motion and neck supple. No rigidity or tenderness.  Lymphadenopathy:     Cervical: No cervical adenopathy.  Skin:    General: Skin is warm and dry.     Capillary Refill: Capillary refill takes less than 2 seconds.  Neurological:     General: No focal deficit present.     Mental Status: He is alert and oriented to person, place, and time. Mental status is at baseline.     GCS: GCS eye subscore is 4. GCS verbal subscore is 5. GCS motor subscore is 6.  Psychiatric:        Mood and Affect: Mood normal.        Behavior: Behavior normal.    ED Results / Procedures / Treatments   Labs (all labs ordered are listed, but only abnormal results are displayed) Labs Reviewed  URINALYSIS, COMPLETE (UACMP) WITH MICROSCOPIC - Abnormal; Notable for the following components:      Result Value   APPearance HAZY (*)    Hgb urine dipstick  MODERATE (*)    Leukocytes,Ua LARGE (*)    WBC, UA >50 (*)    Bacteria, UA FEW (*)    All other components within normal limits  URINE CULTURE    EKG None  Radiology No results found.  Procedures Procedures   Medications Ordered in ED Medications - No data to display  ED Course  I have reviewed the triage vital signs and the nursing notes.  Pertinent labs & imaging results that were available during my care of the patient were reviewed by me and considered in my medical decision making (see chart for details).    MDM Rules/Calculators/A&P                           [name] is a 87 y.o. male presenting with dysuria. Initial VS wnl. Bladder scan 150cc.   Labs: UA notable for large leukocyte esterase, significant WBC and bacteria negative nitrite.  Moderate blood.  Imaging: Not indicated  DDX considered: acute urinary retention, pyelonephritis, nephrolithiasis, GU trauma, testicular torsion, hydronephrosis, sepsis.  History, examination, and objective data most consistent with UTI.  No sign of acute urinary retention, actively urinating and with small  bladder scan while in ED.  No systemic infectious signs or symptoms.  No sign of trauma.  No sign of penile pathology, such as balanitis, and GU exam otherwise normal without concerns for testicular torsion.  No CVA tenderness.  Patient appears clinically well-hydrated. No sign of obstructive process and no pain outside simple dysuria.  Medications: Medications - No data to display    Re-evaluated prior to discharge. Hemodynamically stable and in no acute distress.  Discharged home in stable condition.  Rx Bactrim x7d.  Strict ED return precautions advised. Supportive care discussed. Outpatient PCP follow-up advised. Patient understands and agrees with the plan.  The plan for this patient was discussed with my attending physician, who voiced agreement and who oversaw evaluation and treatment of this patient.      Note: Estate manager/land agent was used in the creation of this note.  Final Clinical Impression(s) / ED Diagnoses Final diagnoses:  Acute cystitis with hematuria    Rx / DC Orders ED Discharge Orders          Ordered    sulfamethoxazole-trimethoprim (BACTRIM DS) 800-160 MG tablet  2 times daily        04/07/21 1807             , [place], DO 04/08/21 0008    Isla Pence, MD 04/08/21 1537

## 2021-04-10 LAB — URINE CULTURE: Culture: >=100000 — AB

## 2021-07-24 ENCOUNTER — Other Ambulatory Visit: Payer: Self-pay | Admitting: Cardiovascular Disease

## 2021-09-20 ENCOUNTER — Other Ambulatory Visit: Payer: Self-pay

## 2021-09-20 ENCOUNTER — Emergency Department (HOSPITAL_COMMUNITY): Payer: Medicare HMO

## 2021-09-20 ENCOUNTER — Emergency Department (HOSPITAL_COMMUNITY)
Admission: EM | Admit: 2021-09-20 | Discharge: 2021-09-20 | Disposition: A | Discharge status: Home / Self Care | Payer: Medicare HMO | Attending: Emergency Medicine | Admitting: Emergency Medicine

## 2021-09-20 DIAGNOSIS — S0101XA Laceration without foreign body of scalp, initial encounter: Secondary | ICD-10-CM | POA: Insufficient documentation

## 2021-09-20 DIAGNOSIS — Z794 Long term (current) use of insulin: Secondary | ICD-10-CM | POA: Diagnosis not present

## 2021-09-20 DIAGNOSIS — W06XXXA Fall from bed, initial encounter: Secondary | ICD-10-CM | POA: Diagnosis not present

## 2021-09-20 DIAGNOSIS — S0990XA Unspecified injury of head, initial encounter: Secondary | ICD-10-CM

## 2021-09-20 DIAGNOSIS — S62525A Nondisplaced fracture of distal phalanx of left thumb, initial encounter for closed fracture: Secondary | ICD-10-CM | POA: Insufficient documentation

## 2021-09-20 DIAGNOSIS — M542 Cervicalgia: Secondary | ICD-10-CM | POA: Diagnosis not present

## 2021-09-20 DIAGNOSIS — Z23 Encounter for immunization: Secondary | ICD-10-CM | POA: Insufficient documentation

## 2021-09-20 DIAGNOSIS — Z7982 Long term (current) use of aspirin: Secondary | ICD-10-CM | POA: Insufficient documentation

## 2021-09-20 DIAGNOSIS — S6992XA Unspecified injury of left wrist, hand and finger(s), initial encounter: Secondary | ICD-10-CM | POA: Diagnosis present

## 2021-09-20 MED ORDER — LIDOCAINE-EPINEPHRINE-TETRACAINE (LET) TOPICAL GEL
3.0000 mL | Freq: Once | TOPICAL | Status: AC
  Administered 2021-09-20: 3 mL via TOPICAL
  Filled 2021-09-20: qty 3

## 2021-09-20 MED ORDER — TETANUS-DIPHTH-ACELL PERTUSSIS 5-2.5-18.5 LF-MCG/0.5 IM SUSY
0.5000 mL | PREFILLED_SYRINGE | Freq: Once | INTRAMUSCULAR | Status: AC
  Administered 2021-09-20: 0.5 mL via INTRAMUSCULAR
  Filled 2021-09-20: qty 0.5

## 2021-09-20 NOTE — ED Notes (Signed)
Wound on scalp cleaned, LET applied, stapler at bedside ?

## 2021-09-20 NOTE — ED Provider Notes (Signed)
?[facility] EMERGENCY DEPARTMENT ?Provider Note ? ? ?CSN: [id number] ?Arrival date & time: 09/20/21  1929 ? ?  ? ?History ? ?Chief Complaint  ?Patient presents with  ? Fall  ? Head Injury  ? ? ?[name] is a 88 y.o. male. ? ?Patient is a 88 year old male who presents after a fall.  He has a history of blindness.  He reportedly rolled off the side of the bed and hit his head on a table.  There is a laceration to the posterior scalp area.  He had a nosebleed which has resolved.  He complains of some neck pain.  He also has some pain to his left thumb but denies any other injuries.  He denies any loss of consciousness.  He says he feels like he just fell asleep and rolled off the bed.  He denies any other recent illnesses.  He does not know when his last tetanus shot was.  He is not anticoagulants. ? ? ?  ? ?Home Medications ?Prior to Admission medications   ?Medication Sig Start Date End Date Taking? Authorizing Provider  ?acetaminophen (TYLENOL) 325 MG tablet Take 2 tablets (650 mg total) by mouth every 6 (six) hours. 02/12/18   Elgergawy, Leana Roe, MD  ?amLODipine (NORVASC) 5 MG tablet Take 1 tablet (5 mg total) by mouth daily. 09/11/19   Croitoru, Mihai, MD  ?aspirin EC 81 MG EC tablet Take 1 tablet (81 mg total) by mouth daily. 11/19/17   Barrett, Joline Salt, PA-C  ?atorvastatin (LIPITOR) 80 MG tablet Take 1 tablet (80 mg total) by mouth daily. 05/16/20   Croitoru, Mihai, MD  ?furosemide (LASIX) 20 MG tablet TAKE ONE TABLET BY MOUTH ONCE DAILY 07/25/21   Croitoru, Rachelle Hora, MD  ?insulin aspart (NOVOLOG) 100 UNIT/ML injection Inject 0-9 Units into the skin 3 (three) times daily with meals. 02/12/18   Elgergawy, Leana Roe, MD  ?Menthol-Camphor 16-11 % CREA Apply 1 application topically as needed (knee pain).    [provider]  ?metoprolol succinate (TOPROL-XL) 25 MG 24 hr tablet TAKE 1 TABLET BY MOUTH EVERY DAY 08/14/19   Croitoru, Mihai, MD  ?nitroGLYCERIN (NITROSTAT) 0.4 MG SL tablet  Dissolve 1 tab under tongue as needed for chest pain. May repeat every 5 minutes x 2 more doses. If no relief call 9-1-1. 12/28/20   Croitoru, Rachelle Hora, MD  ?prednisoLONE acetate (PRED FORTE) 1 % ophthalmic suspension Place 1 drop into the right eye at bedtime. 11/29/17   Medina-Vargas, Monina C, NP  ?sertraline (ZOLOFT) 50 MG tablet Take 1 tablet (50 mg total) by mouth at bedtime. 11/29/17   Medina-Vargas, Margit Banda, NP  ?TRAVATAN Z 0.004 % SOLN ophthalmic solution Place 1 drop into the left eye at bedtime. 11/29/17   Medina-Vargas, Monina C, NP  ?   ? ?Allergies    ?Patient has no known allergies.   ? ?Review of Systems   ?Review of Systems  ?Constitutional:  Negative for chills, diaphoresis, fatigue and fever.  ?HENT:  Negative for congestion, rhinorrhea and sneezing.   ?Eyes: Negative.   ?Respiratory:  Negative for cough, chest tightness and shortness of breath.   ?Cardiovascular:  Negative for chest pain and leg swelling.  ?Gastrointestinal:  Negative for abdominal pain, blood in stool, diarrhea, nausea and vomiting.  ?Genitourinary:  Negative for difficulty urinating, flank pain, frequency and hematuria.  ?Musculoskeletal:  Positive for arthralgias and neck pain. Negative for back pain.  ?Skin:  Negative for rash.  ?Neurological:  Positive for headaches.  Negative for dizziness, speech difficulty, weakness and numbness.  ? ?Physical Exam ?Updated Vital Signs ?BP (!) 142/83   Pulse (!) 59   Temp 97.8 ?F (36.6 ?C) (Oral)   Resp 14   SpO2 97%  ?Physical Exam ?Constitutional:   ?   Appearance: He is well-developed.  ?HENT:  ?   Head: Normocephalic.  ?   Comments: 1 cm laceration to the posterior scalp, no active bleeding, hematoma to the forehead, dried blood in the naris, no septal hematoma, no tenderness to the nose or other facial bones. ?Eyes:  ?   Pupils: Pupils are equal, round, and reactive to light.  ?Neck:  ?   Comments: Minor tenderness to cervical spine.  No pain to the thoracic or lumbosacral spine.  No  step-offs or deformities. ?Cardiovascular:  ?   Rate and Rhythm: Normal rate and regular rhythm.  ?   Heart sounds: Normal heart sounds.  ?Pulmonary:  ?   Effort: Pulmonary effort is normal. No respiratory distress.  ?   Breath sounds: Normal breath sounds. No wheezing or rales.  ?Chest:  ?   Chest wall: No tenderness.  ?Abdominal:  ?   General: Bowel sounds are normal.  ?   Palpations: Abdomen is soft.  ?   Tenderness: There is no abdominal tenderness. There is no guarding or rebound.  ?Musculoskeletal:     ?   General: Normal range of motion.  ?   Comments: Swelling and ecchymosis to the left thumb.  There is a tiny superficial abrasion but no lacerations.  There are some tenderness to this area.  No other pain on palpation or range of motion of the extremities  ?Lymphadenopathy:  ?   Cervical: No cervical adenopathy.  ?Skin: ?   General: Skin is warm and dry.  ?   Findings: No rash.  ?Neurological:  ?   General: No focal deficit present.  ?   Mental Status: He is alert and oriented to person, place, and time.  ? ? ?ED Results / Procedures / Treatments   ?Labs ?(all labs ordered are listed, but only abnormal results are displayed) ?Labs Reviewed - No data to display ? ?EKG ?None ? ?Radiology ?CT Head Wo Contrast ? ?Result Date: 09/20/2021 ?CLINICAL DATA:  Head injury, post fall from couch. Patient is blind and reported rolling from couch striking head on the table. EXAM: CT HEAD WITHOUT CONTRAST CT CERVICAL SPINE WITHOUT CONTRAST TECHNIQUE: Multidetector CT imaging of the head and cervical spine was performed following the standard protocol without intravenous contrast. Multiplanar CT image reconstructions of the cervical spine were also generated. RADIATION DOSE REDUCTION: This exam was performed according to the departmental dose-optimization program which includes automated exposure control, adjustment of the mA and/or kV according to patient size and/or use of iterative reconstruction technique. COMPARISON:  CT  examination dated February 04, 2018 FINDINGS: CT HEAD FINDINGS Brain: No evidence of acute infarction, hemorrhage, hydrocephalus, extra-axial collection or mass lesion/mass effect. Cerebral atrophy and chronic microvascular ischemic changes of the white matter. Lacunar infarct of the left basal ganglia. Vascular: No hyperdense vessel or unexpected calcification. Skull: Midline frontal and small high frontal scalp hematoma without evidence of calvarial fracture. Sinuses/Orbits: No acute finding. Other: None. CT CERVICAL SPINE FINDINGS Alignment: Straightening of the cervical spine. Skull base and vertebrae: No acute fracture. No primary bone lesion or focal pathologic process. Soft tissues and spinal canal: No prevertebral fluid or swelling. No visible canal hematoma. Disc levels: C2-C3:  No significant spinal  canal or neural foraminal stenosis. C3-C4: Disc osteophyte complex with mild spinal canal stenosis. Mild bilateral neural foraminal stenosis. Bilateral facet joint arthropathy. C4-C5: Disc osteophyte complex with mild spinal canal stenosis. Mild bilateral neural foraminal stenosis right greater than the left. Moderate bilateral facet joint arthropathy. C5-C6: Disc osteophyte complex with spinal canal stenosis and moderate facet joint arthropathy. Moderate bilateral neural foraminal stenosis. C6-C7: Disc osteophyte complex with spinal canal stenosis. Mild-to-moderate bilateral neural foraminal stenosis. Facet joint arthropathy. C7-T1: Disc osteophyte complex with mild-to-moderate spinal canal stenosis. Mild bilateral neural foraminal stenosis. Upper chest: Negative. Other: None IMPRESSION: 1.  No acute intracranial abnormality. 2. Forehead and vertex scalp contusion without evidence of calvarial fracture. 3. Cerebral atrophy and chronic microvascular ischemic changes of the white matter. 4. Advanced multilevel degenerate disc disease of the cervical spine without evidence of acute traumatic fracture or  subluxation. Electronically Signed   By: Larose Hires D.O.   On: 09/20/2021 22:50  ? ?CT Cervical Spine Wo Contrast ? ?Result Date: 09/20/2021 ?CLINICAL DATA:  Head injury, post fall from couch. Patient is blind and reporte

## 2021-09-20 NOTE — ED Triage Notes (Addendum)
Pt arrived via [facility] for cc of head injury post fall from couch. Pt is blind and reported rolling from couch striking head on side table and floor. Small laceration noted to top of head, blood in nares, hematoma to forehead, bleeding controlled at time of arrival to ED. No blood thinner, LOC. PMH dementia, oriented to baseline A&Ox3, GCS 14, oriented to baseline. Strong urine smell present. Daughter is POA.  ? ?EMS vitals ? ?BP 152/86 ?HR 62 ?SPO2 90% RA ?RR 16 ?CBG 134 ?

## 2021-09-20 NOTE — ED Notes (Signed)
Pt back from CT, remaining LET placed on wound. Pt resting comfortably at this time.  ? ?

## 2021-09-20 NOTE — ED Notes (Signed)
Patient transported to CT 

## 2021-09-20 NOTE — Discharge Instructions (Addendum)
Follow-up with your primary care doctor.  You will need to have the staple removed in 1 week.  You have a small avulsion fracture of your thumb.  Have this reassessed by your primary care doctor.  Return to the emergency room if you have any worsening symptoms. ?

## 2021-10-17 ENCOUNTER — Ambulatory Visit: Payer: Medicare HMO | Admitting: Physician Assistant

## 2021-10-31 ENCOUNTER — Emergency Department (HOSPITAL_COMMUNITY)
Admission: EM | Admit: 2021-10-31 | Discharge: 2021-10-31 | Disposition: A | Discharge status: Home / Self Care | Payer: Medicare HMO | Attending: Emergency Medicine | Admitting: Emergency Medicine

## 2021-10-31 ENCOUNTER — Other Ambulatory Visit: Payer: Self-pay

## 2021-10-31 DIAGNOSIS — Z794 Long term (current) use of insulin: Secondary | ICD-10-CM | POA: Insufficient documentation

## 2021-10-31 DIAGNOSIS — Z4802 Encounter for removal of sutures: Secondary | ICD-10-CM | POA: Diagnosis present

## 2021-10-31 DIAGNOSIS — Z7982 Long term (current) use of aspirin: Secondary | ICD-10-CM | POA: Insufficient documentation

## 2021-10-31 NOTE — ED Triage Notes (Signed)
Pt/son stated, he fell about 3 weeks ago and has a staple on top of his head. Here to get removed

## 2021-10-31 NOTE — ED Notes (Signed)
Patient verbalizes understanding of discharge instructions. Opportunity for questioning and answers were provided. Armband removed by staff, pt discharged from ED. Pt ambulatory to ED waiting room. 

## 2021-10-31 NOTE — ED Provider Notes (Signed)
[facility] EMERGENCY DEPARTMENT Provider Note   CSN: [id number] Arrival date & time: 10/31/21  1041     History  Chief Complaint  Patient presents with   Suture / Staple Removal    [name] is a 88 y.o. male.  HPI 88 year old male presents for staple removal.  He has no complaints.  Son endorses that he is delayed in getting back to the emergency department to have staples removed because he just was not able to get in.  These were originally placed on 4/19.  No headache or infectious symptoms.  Home Medications Prior to Admission medications   Medication Sig Start Date End Date Taking? Authorizing Provider  acetaminophen (TYLENOL) 325 MG tablet Take 2 tablets (650 mg total) by mouth every 6 (six) hours. 02/12/18   Elgergawy, Silver Huguenin, MD  amLODipine (NORVASC) 5 MG tablet Take 1 tablet (5 mg total) by mouth daily. 09/11/19   Croitoru, Mihai, MD  aspirin EC 81 MG EC tablet Take 1 tablet (81 mg total) by mouth daily. 11/19/17   Barrett, Evelene Croon, PA-C  atorvastatin (LIPITOR) 80 MG tablet Take 1 tablet (80 mg total) by mouth daily. 05/16/20   Croitoru, Mihai, MD  furosemide (LASIX) 20 MG tablet TAKE ONE TABLET BY MOUTH ONCE DAILY 07/25/21   Croitoru, Mihai, MD  insulin aspart (NOVOLOG) 100 UNIT/ML injection Inject 0-9 Units into the skin 3 (three) times daily with meals. 02/12/18   Elgergawy, Silver Huguenin, MD  Menthol-Camphor 16-11 % CREA Apply 1 application topically as needed (knee pain).    [provider]  metoprolol succinate (TOPROL-XL) 25 MG 24 hr tablet TAKE 1 TABLET BY MOUTH EVERY DAY 08/14/19   Croitoru, Mihai, MD  nitroGLYCERIN (NITROSTAT) 0.4 MG SL tablet Dissolve 1 tab under tongue as needed for chest pain. May repeat every 5 minutes x 2 more doses. If no relief call 9-1-1. 12/28/20   Croitoru, Dani Gobble, MD  prednisoLONE acetate (PRED FORTE) 1 % ophthalmic suspension Place 1 drop into the right eye at bedtime. 11/29/17   Medina-Vargas, Monina C, NP   sertraline (ZOLOFT) 50 MG tablet Take 1 tablet (50 mg total) by mouth at bedtime. 11/29/17   Medina-Vargas, Monina C, NP  TRAVATAN Z 0.004 % SOLN ophthalmic solution Place 1 drop into the left eye at bedtime. 11/29/17   Medina-Vargas, Monina C, NP      Allergies    Patient has no known allergies.    Review of Systems   Review of Systems  Skin:  Negative for color change.  Neurological:  Negative for headaches.   Physical Exam Updated Vital Signs BP (!) 153/94 (BP Location: Right Arm)   Pulse 92   Temp 99.2 F (37.3 C) (Oral)   Resp 16   SpO2 91%  Physical Exam Vitals and nursing note reviewed.  Constitutional:      Appearance: He is well-developed.  HENT:     Head: Normocephalic and atraumatic.   Pulmonary:     Effort: Pulmonary effort is normal.  Abdominal:     General: There is no distension.     Palpations: Abdomen is soft.  Skin:    General: Skin is warm and dry.     Findings: No erythema.  Neurological:     Mental Status: He is alert.    ED Results / Procedures / Treatments   Labs (all labs ordered are listed, but only abnormal results are displayed) Labs Reviewed - No data to display  EKG None  Radiology No results found.  Procedures .Suture Removal  Date/Time: 10/31/2021 1:30 PM Performed by: Sherwood Gambler, MD Authorized by: Sherwood Gambler, MD   Consent:    Consent obtained:  Verbal   Consent given by:  Patient Universal protocol:    Patient identity confirmed:  Verbally with patient Location:    Location:  Head/neck   Head/neck location:  Scalp Procedure details:    Wound appearance:  No signs of infection   Number of staples removed:  1 Post-procedure details:    Procedure completion:  Tolerated well, no immediate complications    Medications Ordered in ED Medications - No data to display  ED Course/ Medical Decision Making/ A&P                           Medical Decision Making  Patient presents for staple removal.  Chart  review shows on 4/19 he had 1 staple placed.  This was removed without complication there is no signs of infection.  Discharged home.        Final Clinical Impression(s) / ED Diagnoses Final diagnoses:  Encounter for staple removal    Rx / DC Orders ED Discharge Orders     None         Sherwood Gambler, MD 10/31/21 1331

## 2021-10-31 NOTE — Progress Notes (Unsigned)
Cardiology Office Note:    Date:  11/01/2021   ID:  [name], DOB [birth year], MRN [id number]  PCP:  Clayborn Heron, MD [facility] Cardiologist: Thurmon Fair, MD   Reason for visit: Swelling  History of Present Illness:    [name] is a 88 y.o. male with a hx of CAD, chronic combined CHF, trifascicular block, syncope, HTN, HLD, DM2, and CKD.  Echocardiogram in 2019 showed EF 40 to 45%, grade 1 DD, moderate LVH, no significant valvular abnormality. Last cardiac catheterization in 2019 revealed patent left circumflex and OM 2 stents with high-grade lesions in the RCA requiring DES x2, 95% distal D1 lesion which was managed medically.   He last saw Azalee Course in May 2022.  He was noted to be quite sedentary.  At that time patient was taking Lasix 20 mg daily.  Today, his son mostly talks for him.  He states they are here for regular follow-up since its been a year.  They do note recent increased leg swelling.  Patient is wheelchair-bound.  Son states he does the cooking and does not use salt.  Patient could not tolerate compression stockings in the past.  Patient has dyspnea walking in the house.  He sleeps in a hospital bed with head elevated around 45 degrees.  Denies PND, chest pain, palpitations, lightheadedness and syncope.  Prior to last stenting, patient had chest pain per son's report.     Past Medical History:  Diagnosis Date   Arthritis    "all over" (11/12/2017)   CAD (coronary artery disease) 11/11/2017   S/p IL STEMI 6/19:  S/p DES to pLCx and DES x 2 to OM2 // staged PCI 6/19:  DES x 2 to RCA // residual CAD at cath 6/19:  mLAD 80, dLAD 90, D1 95 tx medically // EF 40-45 by echo   Chronic combined systolic and diastolic CHF (congestive heart failure) (HCC)    ischemic CM // s/p MI in 11/2017 >> Echo 6/19:  Moderate concentric LVH, EF 40-45, grade 1 diastolic dysfunction, inferior, inferolateral, apical inferior akinesis    Chronic lower back pain     CKD (chronic kidney disease), stage III (HCC)    Constipation    takes an OTC med as needed   Diabetic nephropathy (HCC)    Gastric ulcer 1970s   Glaucoma, both eyes    uses eye drops daily   Gout    takes Allopurinol daily   History of colon polyps    History of ST elevation myocardial infarction (STEMI) 11/10/2017   PCI to LCx and staged PCI to RCA   History of stress test    a. 09/2014 MV: EF 53%, no ischemia/infarct.   Hyperlipidemia    takes Pravastatin daily   Hypertension    takes Amlodipine and Losartan daily   Insomnia    uses OTC sleep aide   Joint pain    Joint swelling    right knee    Legally blind    Macular degeneration    Morbid obesity (HCC)    Nocturia    Peripheral edema    Pleurisy 20+yrs ago   Pneumonia    "I think once when I was a kid" (11/12/2017)   Sleep apnea    Stroke (HCC)    "I've had them; don't know anything about them"; denies residual on 11/12/2017)   Syncope    a. 09/2014 in setting of orthostasis; b. 10/2014 Event monitor: 19 beat run of  asymp NSVT; c. 01/2015 Echo: EF 60-65%, no rwma.   Type II diabetes mellitus (HCC)    takes Metformin daily   Urinary frequency    takes Flomax daily    Past Surgical History:  Procedure Laterality Date   BACK SURGERY     CATARACT EXTRACTION W/ INTRAOCULAR LENS  IMPLANT, BILATERAL Bilateral    COLONOSCOPY     CORONARY ANGIOGRAPHY N/A 11/12/2017   Procedure: CORONARY ANGIOGRAPHY;  Surgeon: Lennette Bihari, MD;  Location: [facility] INVASIVE [facility];  Service: Cardiovascular;  Laterality: N/A;   CORONARY STENT INTERVENTION N/A 11/12/2017   Procedure: CORONARY STENT INTERVENTION;  Surgeon: Lennette Bihari, MD;  Location: [facility] INVASIVE CV LAB;  Service: Cardiovascular;  Laterality: N/A;   CORONARY/GRAFT ACUTE MI REVASCULARIZATION N/A 11/10/2017   Procedure: Coronary/Graft Acute MI Revascularization;  Surgeon: Lennette Bihari, MD;  Location: [facility] INVASIVE CV LAB;  Service: Cardiovascular;  Laterality: N/A;   EYE SURGERY      GLAUCOMA SURGERY Bilateral    INSERTION / PLACEMENT / REVISION NEUROSTIMULATOR  2000s   KNEE ARTHROSCOPY Right 1990s   LEFT HEART CATH AND CORONARY ANGIOGRAPHY N/A 11/10/2017   Procedure: LEFT HEART CATH AND CORONARY ANGIOGRAPHY;  Surgeon: Lennette Bihari, MD;  Location: [facility] INVASIVE CV LAB;  Service: Cardiovascular;  Laterality: N/A;   MINI SHUNT INSERTION  09/26/2011   Procedure: INSERTION OF MINI SHUNT;  Surgeon: Chalmers Guest, MD;  Location: [facility] OR;  Service: Ophthalmology;  Laterality: Right;  Insertion of Ahmed shunt   POSTERIOR FUSION LUMBAR SPINE  1990s   REVERSE SHOULDER ARTHROPLASTY Right 09/21/2013   Procedure: RIGHT  SHOULDER ARTHROPLASTY REVERSE ;  Surgeon: [name], MD;  Location: [facility] OR;  Service: Orthopedics;  Laterality: Right;   SHOULDER INJECTION Left 09/21/2013   Procedure: SHOULDER INJECTION;  Surgeon: [name], MD;  Location: [facility] OR;  Service: Orthopedics;  Laterality: Left;    Current Medications: Current Meds  Medication Sig   acetaminophen (TYLENOL) 325 MG tablet Take 2 tablets (650 mg total) by mouth every 6 (six) hours.   amLODipine (NORVASC) 5 MG tablet Take 1 tablet (5 mg total) by mouth daily.   aspirin EC 81 MG EC tablet Take 1 tablet (81 mg total) by mouth daily.   atorvastatin (LIPITOR) 80 MG tablet Take 1 tablet (80 mg total) by mouth daily.   furosemide (LASIX) 40 MG tablet Take 1 tablet (40 mg total) by mouth daily.   insulin aspart (NOVOLOG) 100 UNIT/ML injection Inject 0-9 Units into the skin 3 (three) times daily with meals.   Menthol-Camphor 16-11 % CREA Apply 1 application topically as needed (knee pain).   metoprolol succinate (TOPROL-XL) 25 MG 24 hr tablet TAKE 1 TABLET BY MOUTH EVERY DAY   nitroGLYCERIN (NITROSTAT) 0.4 MG SL tablet Dissolve 1 tab under tongue as needed for chest pain. May repeat every 5 minutes x 2 more doses. If no relief call 9-1-1.   prednisoLONE acetate (PRED FORTE) 1 % ophthalmic suspension Place 1 drop into the right  eye at bedtime.   sertraline (ZOLOFT) 50 MG tablet Take 1 tablet (50 mg total) by mouth at bedtime.   TRAVATAN Z 0.004 % SOLN ophthalmic solution Place 1 drop into the left eye at bedtime.   [DISCONTINUED] furosemide (LASIX) 20 MG tablet TAKE ONE TABLET BY MOUTH ONCE DAILY     Allergies:   Patient has no known allergies.   Social History   Socioeconomic History   Marital status: Widowed  Spouse name: Not on file   Number of children: Not on file   Years of education: Not on file   Highest education level: Not on file  Occupational History   Not on file  Tobacco Use   Smoking status: Former    Packs/day: 2.00    Types: Cigarettes   Smokeless tobacco: Never   Tobacco comments:    11/12/2017 "nothing since the 1980s"  Vaping Use   Vaping Use: Never used  Substance and Sexual Activity   Alcohol use: Not Currently    Comment: 11/12/2017 "nothing since the 1980s"   Drug use: Never   Sexual activity: Not Currently  Other Topics Concern   Not on file  Social History Narrative   Legally blind.   Social Determinants of Health   Financial Resource Strain: Not on file  Food Insecurity: Not on file  Transportation Needs: Not on file  Physical Activity: Not on file  Stress: Not on file  Social Connections: Not on file     Family History: The patient's family history includes Alzheimer's disease in his maternal aunt; Cancer in his mother. There is no history of Anesthesia problems.  ROS:   Please see the history of present illness.     EKGs/Labs/Other Studies Reviewed:    EKG:  The ekg ordered today demonstrates normal sinus rhythm with first-degree AV block, right bundle branch block, left anterior fascicular block, heart rate 75, PR interval 290 ms, QRS duration 142 ms.   Known bifascicular block.  Recent Labs: No results found for requested labs within last 8760 hours.   Recent Lipid Panel Lab Results  Component Value Date/Time   CHOL 138 11/11/2017 02:17 AM    TRIG 72 11/11/2017 02:17 AM   HDL 36 (L) 11/11/2017 02:17 AM   LDLCALC 88 11/11/2017 02:17 AM    Physical Exam:    VS:  BP (!) 152/89   Pulse 75   Ht  (1.778 m)   Wt 215 lb 12.8 oz (97.9 kg)   SpO2 96%   BMI 30.96 kg/m    No data found.  Wt Readings from Last 3 Encounters:  11/01/21 215 lb 12.8 oz (97.9 kg)  04/07/21 230 lb (104.3 kg)  10/05/20 241 lb 3.2 oz (109.4 kg)     GEN: Elderly male in wheelchair, in no acute distress HEENT: Normal NECK:  JVD to low neck at 90 degrees; No carotid bruits CARDIAC: RRR, no murmurs, rubs, gallops RESPIRATORY:  Clear to auscultation without rales, wheezing or rhonchi  ABDOMEN: Soft, non-tender, non-distended MUSCULOSKELETAL: Trace edema SKIN: Warm and dry NEUROLOGIC:  Alert and oriented PSYCHIATRIC:  Normal affect    ASSESSMENT AND PLAN   Chronic systolic diastolic heart failure, minimally hypervolemic -Echo 2019 with EF 40-45%, grade 1 diastolic dysfunction  -Recommend increasing Lasix to 40 mg daily.  Check BMET today and again in 2 weeks. -We will defer on repeat echo due to minimal fluid retention on exam.  His dyspnea on exertion is likely secondary to deconditioning/sedentary lifestyle.  Lower extremity edema likely dependent edema.  Coronary artery disease, no angina -LHC 2019 revealed patent left circumflex and OM 2 stents with high-grade lesions in the RCA requiring DES x2, 95% distal D1 lesion which was managed medically. (Patient had chest pain) -Continue aspirin, Lipitor and metoprolol.  Hypertension, slightly elevated -BP 152/89, repeat 140/80.  Son states blood pressure typically good. -Continue amlodipine 5 mg daily.  Bring blood pressure log to next appointment. -Goal BP for him  is less than 140/90 given age.    Hyperlipidemia with goal LDL less than 70 -Lipids in May 2022 with total cholesterol 125, HDL 32, LDL 77 and triglycerides 80. -Continue Lipitor.  Disposition - Follow-up in 6 months with Dr.  Royann Shivers.   Medication Adjustments/Labs and Tests Ordered: Current medicines are reviewed at length with the patient today.  Concerns regarding medicines are outlined above.  Orders Placed This Encounter  Procedures   Basic metabolic panel   Basic metabolic panel   EKG 12-Lead   Meds ordered this encounter  Medications   furosemide (LASIX) 40 MG tablet    Sig: Take 1 tablet (40 mg total) by mouth daily.    Dispense:  90 tablet    Refill:  3    Patient Instructions  Medication Instructions:  Increase Lasix to 40 mg ( Take 1 Tablet Daily). *If you need a refill on your cardiac medications before your next appointment, please call your pharmacy*   Lab Work: BMET : Today Repeat BMET  To Be Done In 2 weeks. If you have labs (blood work) drawn today and your tests are completely normal, you will receive your results only by: MyChart Message (if you have MyChart) OR A paper copy in the mail If you have any lab test that is abnormal or we need to change your treatment, we will call you to review the results.   Testing/Procedures: No Testing   Follow-Up: At [facility], you and your health needs are our priority.  As part of our continuing mission to provide you with exceptional heart care, we have created designated Provider Care Teams.  These Care Teams include your primary Cardiologist (physician) and Advanced Practice Providers (APPs -  Physician Assistants and Nurse Practitioners) who all work together to provide you with the care you need, when you need it.  We recommend signing up for the patient portal called "MyChart".  Sign up information is provided on this After Visit Summary.  MyChart is used to connect with patients for Virtual Visits (Telemedicine).  Patients are able to view lab/test results, encounter notes, upcoming appointments, etc.  Non-urgent messages can be sent to your provider as well.   To learn more about what you can do with MyChart, go to  [email or web address].    Your next appointment:   6 month(s)  The format for your next appointment:   In Person  Provider:   Thurmon Fair, MD     Other Instructions Bring Blood Pressure Log to Follow- up Visit.  Important Information About Sugar         Signed, Bernette Mayers  11/01/2021 11:02 AM    [facility]

## 2021-11-01 ENCOUNTER — Encounter: Payer: Self-pay | Admitting: Physician Assistant

## 2021-11-01 ENCOUNTER — Ambulatory Visit: Payer: Medicare HMO | Admitting: Physician Assistant

## 2021-11-01 VITALS — BP 152/89 | HR 75 | Ht 70.0 in | Wt 215.8 lb

## 2021-11-01 DIAGNOSIS — I5042 Chronic combined systolic (congestive) and diastolic (congestive) heart failure: Secondary | ICD-10-CM

## 2021-11-01 DIAGNOSIS — E785 Hyperlipidemia, unspecified: Secondary | ICD-10-CM

## 2021-11-01 DIAGNOSIS — I2510 Atherosclerotic heart disease of native coronary artery without angina pectoris: Secondary | ICD-10-CM | POA: Diagnosis not present

## 2021-11-01 DIAGNOSIS — I10 Essential (primary) hypertension: Secondary | ICD-10-CM | POA: Diagnosis not present

## 2021-11-01 LAB — BASIC METABOLIC PANEL
BUN/Creatinine Ratio: 14 (ref 10–24)
BUN: 16 mg/dL (ref 8–27)
CO2: 26 mmol/L (ref 20–29)
Calcium: 9.2 mg/dL (ref 8.6–10.2)
Chloride: 103 mmol/L (ref 96–106)
Creatinine, Ser: 1.14 mg/dL (ref 0.76–1.27)
Glucose: 111 mg/dL — ABNORMAL HIGH (ref 70–99)
Potassium: 3.8 mmol/L (ref 3.5–5.2)
Sodium: 141 mmol/L (ref 134–144)
eGFR: 62 mL/min/{1.73_m2} (ref >59)

## 2021-11-01 MED ORDER — FUROSEMIDE 40 MG PO TABS
40.0000 mg | ORAL_TABLET | Freq: Every day | ORAL | 3 refills | Status: DC
Start: 2021-11-01 — End: 2022-06-27

## 2021-11-01 NOTE — Patient Instructions (Signed)
Medication Instructions:  Increase Lasix to 40 mg ( Take 1 Tablet Daily). *If you need a refill on your cardiac medications before your next appointment, please call your pharmacy*   Lab Work: BMET : Today Repeat BMET  To Be Done In 2 weeks. If you have labs (blood work) drawn today and your tests are completely normal, you will receive your results only by: MyChart Message (if you have MyChart) OR A paper copy in the mail If you have any lab test that is abnormal or we need to change your treatment, we will call you to review the results.   Testing/Procedures: No Testing   Follow-Up: At [facility], you and your health needs are our priority.  As part of our continuing mission to provide you with exceptional heart care, we have created designated Provider Care Teams.  These Care Teams include your primary Cardiologist (physician) and Advanced Practice Providers (APPs -  Physician Assistants and Nurse Practitioners) who all work together to provide you with the care you need, when you need it.  We recommend signing up for the patient portal called "MyChart".  Sign up information is provided on this After Visit Summary.  MyChart is used to connect with patients for Virtual Visits (Telemedicine).  Patients are able to view lab/test results, encounter notes, upcoming appointments, etc.  Non-urgent messages can be sent to your provider as well.   To learn more about what you can do with MyChart, go to [email or web address].    Your next appointment:   6 month(s)  The format for your next appointment:   In Person  Provider:   Thurmon Fair, MD     Other Instructions Bring Blood Pressure Log to Follow- up Visit.  Important Information About Sugar

## 2021-11-02 ENCOUNTER — Telehealth: Payer: Self-pay

## 2021-11-02 NOTE — Telephone Encounter (Addendum)
Called patient spoke with daughter. Patients daughter had understanding of results.----- Message from Cannon Kettle, PA-C sent at 11/02/2021  1:08 PM EDT ----- Kidney function normal at baseline before increasing his diuretic.  Normal potassium. Recheck in 2 weeks to ensure stable with increased diuretic.

## 2021-12-21 ENCOUNTER — Other Ambulatory Visit: Payer: Self-pay

## 2021-12-21 DIAGNOSIS — I5042 Chronic combined systolic (congestive) and diastolic (congestive) heart failure: Secondary | ICD-10-CM

## 2021-12-21 DIAGNOSIS — E785 Hyperlipidemia, unspecified: Secondary | ICD-10-CM

## 2021-12-21 DIAGNOSIS — I2510 Atherosclerotic heart disease of native coronary artery without angina pectoris: Secondary | ICD-10-CM

## 2021-12-21 DIAGNOSIS — I10 Essential (primary) hypertension: Secondary | ICD-10-CM

## 2021-12-21 LAB — BASIC METABOLIC PANEL
BUN/Creatinine Ratio: 13 (ref 10–24)
BUN: 15 mg/dL (ref 8–27)
CO2: 23 mmol/L (ref 20–29)
Calcium: 9.3 mg/dL (ref 8.6–10.2)
Chloride: 100 mmol/L (ref 96–106)
Creatinine, Ser: 1.2 mg/dL (ref 0.76–1.27)
Glucose: 166 mg/dL — ABNORMAL HIGH (ref 70–99)
Potassium: 3.6 mmol/L (ref 3.5–5.2)
Sodium: 138 mmol/L (ref 134–144)
eGFR: 58 mL/min/{1.73_m2} — ABNORMAL LOW (ref >59)

## 2021-12-28 ENCOUNTER — Telehealth: Payer: Self-pay

## 2021-12-28 NOTE — Telephone Encounter (Addendum)
Called patient regarding results. Patient had understanding of results.----- Message from Cannon Kettle, PA-C sent at 12/26/2021  2:10 PM EDT ----- Kidney function stable since increasing Lasix to 40 mg daily. Potassium level normal. Continue current medications.

## 2022-01-15 ENCOUNTER — Ambulatory Visit: Payer: Medicare HMO | Admitting: Podiatry

## 2022-01-15 DIAGNOSIS — B351 Tinea unguium: Secondary | ICD-10-CM

## 2022-01-15 DIAGNOSIS — E118 Type 2 diabetes mellitus with unspecified complications: Secondary | ICD-10-CM | POA: Diagnosis not present

## 2022-01-15 DIAGNOSIS — M79674 Pain in right toe(s): Secondary | ICD-10-CM

## 2022-01-15 DIAGNOSIS — M79675 Pain in left toe(s): Secondary | ICD-10-CM | POA: Diagnosis not present

## 2022-01-16 NOTE — Progress Notes (Signed)
  Subjective:  Patient ID: [name], male    DOB: 1930/12/11,  MRN: [id number]  Chief Complaint  Patient presents with   Diabetes    Toenails long, thick, difficult to trim     88 y.o. male presents with the above complaint. History confirmed with patient.  Here with his son as well.  Says his diabetes is well controlled  Objective:  Physical Exam: warm, good capillary refill, no trophic changes or ulcerative lesions, normal DP and PT pulses, normal monofilament exam, and normal sensory exam. Left Foot: dystrophic yellowed discolored nail plates with subungual debris Right Foot: dystrophic yellowed discolored nail plates with subungual debris  Assessment:   1. Pain due to onychomycosis of toenails of both feet   2. Type 2 diabetes mellitus with complication, without long-term current use of insulin (HCC)      Plan:  Patient was evaluated and treated and all questions answered.  Patient educated on diabetes. Discussed proper diabetic foot care and discussed risks and complications of disease. Educated patient in depth on reasons to return to the office immediately should he/she discover anything concerning or new on the feet. All questions answered. Discussed proper shoes as well.   Discussed the etiology and treatment options for the condition in detail with the patient. Educated patient on the topical and oral treatment options for mycotic nails. Recommended debridement of the nails today. Sharp and mechanical debridement performed of all painful and mycotic nails today. Nails debrided in length and thickness using a nail nipper to level of comfort. Discussed treatment options including appropriate shoe gear. Follow up as needed for painful nails.    Return in about 3 months (around 04/17/2022) for at risk diabetic foot care.

## 2022-04-17 ENCOUNTER — Ambulatory Visit: Payer: Medicare HMO | Admitting: Podiatry

## 2022-04-17 ENCOUNTER — Encounter: Payer: Self-pay | Admitting: Podiatry

## 2022-04-17 DIAGNOSIS — M79674 Pain in right toe(s): Secondary | ICD-10-CM

## 2022-04-17 DIAGNOSIS — E118 Type 2 diabetes mellitus with unspecified complications: Secondary | ICD-10-CM

## 2022-04-17 DIAGNOSIS — M79675 Pain in left toe(s): Secondary | ICD-10-CM

## 2022-04-17 DIAGNOSIS — B351 Tinea unguium: Secondary | ICD-10-CM

## 2022-04-17 NOTE — Progress Notes (Signed)
This patient returns to my office for at risk foot care.  This patient requires this care by a professional since this patient will be at risk due to having diabetes. and CKD.   This patient is unable to cut nails himself since the patient cannot reach his nails.These nails are painful walking and wearing shoes.  This patient presents for at risk foot care today.  General Appearance  Alert, conversant and in no acute stress.  Vascular  Dorsalis pedis and posterior tibial  pulses are weaklypalpable  bilaterally.  Capillary return is within normal limits  bilaterally. Temperature is within normal limits  bilaterally.  Neurologic  Senn-Weinstein monofilament wire test within normal limits  bilaterally. Muscle power within normal limits bilaterally.  Nails Thick disfigured discolored nails with subungual debris  from hallux to fifth toes bilaterally. No evidence of bacterial infection or drainage bilaterally.  Orthopedic  No limitations of motion  feet .  No crepitus or effusions noted.  No bony pathology or digital deformities noted.  Skin  normotropic skin with no porokeratosis noted bilaterally.  No signs of infections or ulcers noted.     Onychomycosis  Pain in right toes  Pain in left toes  Consent was obtained for treatment procedures.   Mechanical debridement of nails 1-5  bilaterally performed with a nail nipper.  Filed with dremel without incident.    Return office visit    3 months                 Told patient to return for periodic foot care and evaluation due to potential at risk complications.   Huldah Marin DPM  

## 2022-04-30 ENCOUNTER — Ambulatory Visit
Admission: RE | Admit: 2022-04-30 | Discharge: 2022-04-30 | Disposition: A | Discharge status: Home / Self Care | Payer: Medicare HMO | Source: Ambulatory Visit | Attending: Family Medicine | Admitting: Family Medicine

## 2022-04-30 ENCOUNTER — Other Ambulatory Visit: Payer: Self-pay | Admitting: Family Medicine

## 2022-04-30 DIAGNOSIS — R0989 Other specified symptoms and signs involving the circulatory and respiratory systems: Secondary | ICD-10-CM

## 2022-05-03 ENCOUNTER — Ambulatory Visit: Payer: Medicare HMO | Attending: Cardiovascular Disease | Admitting: Cardiovascular Disease

## 2022-05-10 ENCOUNTER — Encounter: Payer: Self-pay | Admitting: Cardiovascular Disease

## 2022-06-24 ENCOUNTER — Other Ambulatory Visit: Payer: Self-pay | Admitting: Cardiovascular Disease

## 2022-06-25 NOTE — Progress Notes (Unsigned)
Cardiology Office Note:    Date:  06/27/2022   ID:  [name], DOB Oct 12, 1930, MRN [id number]  PCP:  Ricky Hack, MD   [facility] Providers Cardiologist:  Ricky Fair, MD {  Referring MD: Ricky Heron, MD   Chief Complaint  Patient presents with   Shortness of Breath    On exertion    History of Present Illness:    [name] is a 89 y.o. male with a hx of CAD, chronic combined systolic and diastolic CHF, bifascicular block (RBBB, LAFB), syncope, HTN, HLD, type 2 DM with diabetic nephropathy, stage III CKD, arthritis. Patient had a syncopal episode in 09/2014. Echocardiogram 09/07/2014 showed EF 55-60%, no regional wall motion abnormalities, grade I diastolic dysfunction. Cardiac monitor in 09/2014 showed NSR with nonspecific IVCD, 2 episodes of accelerated idioventricular rhythm (longest lasting 19 consecutive beats), and no significant bradyarrhythmias noted. Later on 11/10/2017, patient had an acute inferolateral STEMI secondary to total occlusion of the circumflex vessel. Underwent PCI with DESx3 to the prox Lcx, prox OM1, and distal OM1. Also found to have multivessel CAD with 30% proximal and 95% distal diagonal 1 stenoses of the LAD, 80% mid distal and 90% distal LAD stenoses, and large dominant RCA with 95% mid stenosis and 80% distal stenosis prior to the PDA takeoff. He was taken back to the cath lab on 11/12/17 for staged PCI, and had PCI to the mid and distal RCA with DES x2. Echocardiogram on 11/11/17 showed EF 40-45%, grade I diastolic dysfunction.   Patient was last seen by cardiology on 11/01/21 for an outpatient appointment. At that appointment, it was noted that patient's son mostly spoke for him. Son did note that the patient had increased leg swelling, and did have some dyspnea walking in the house. Spent majority of his time in an armchair. Patient denied PND, chest pain, palpitations, lightheadedness, or syncope. Increased lasix to 40 mg  daily.   Today, patient presents accompanied by his son.  Patient has difficulty hearing, so the son mostly speaks for him.  Son reports that patient lives a very sedentary life.  He only ever walks from the bed to recliner about 5 feet away, then spends all day in the recliner.  Son has noted that patient becomes short of breath when walking to recliner.  Overall, he does not have shortness of breath while at rest.  However, he occasionally will get short of breath while speaking.  Patient reports noticeable orthopnea, and sleeps with his head elevated. Has occasional ankle edema that improves if he elevates his feet.  Denies cough, chest pain, abdominal distention.  Denies syncope, dizziness, lightheadedness, symptoms of orthostatic hypotension. He has had 2 mechanical falls in the past 2 months. Has only been taking lasix 20 mg daily. Son does not check patient's BP regularly at home, but reports that it is normal when he does check it.  Past Medical History:  Diagnosis Date   Arthritis    "all over" (11/12/2017)   CAD (coronary artery disease) 11/11/2017   S/p IL STEMI 6/19:  S/p DES to pLCx and DES x 2 to OM2 // staged PCI 6/19:  DES x 2 to RCA // residual CAD at cath 6/19:  mLAD 80, dLAD 90, D1 95 tx medically // EF 40-45 by echo   Chronic combined systolic and diastolic CHF (congestive heart failure) (HCC)    ischemic CM // s/p MI in 11/2017 >> Echo 6/19:  Moderate concentric LVH,  EF 64-40, grade 1 diastolic dysfunction, inferior, inferolateral, apical inferior akinesis    Chronic lower back pain    CKD (chronic kidney disease), stage III (HCC)    Constipation    takes an OTC med as needed   Diabetic nephropathy (HCC)    Gastric ulcer 1970s   Glaucoma, both eyes    uses eye drops daily   Gout    takes Allopurinol daily   History of colon polyps    History of ST elevation myocardial infarction (STEMI) 11/10/2017   PCI to LCx and staged PCI to RCA   History of stress test    a. 09/2014 MV:  EF 53%, no ischemia/infarct.   Hyperlipidemia    takes Pravastatin daily   Hypertension    takes Amlodipine and Losartan daily   Insomnia    uses OTC sleep aide   Joint pain    Joint swelling    right knee    Legally blind    Macular degeneration    Morbid obesity ([place])    Nocturia    Peripheral edema    Pleurisy 20+yrs ago   Pneumonia    "I think once when I was a kid" (11/12/2017)   Sleep apnea    Stroke ([place])    "I've had them; don't know anything about them"; denies residual on 11/12/2017)   Syncope    a. 09/2014 in setting of orthostasis; b. 10/2014 Event monitor: 19 beat run of asymp NSVT; c. 01/2015 Echo: EF 60-65%, no rwma.   Type II diabetes mellitus (HCC)    takes Metformin daily   Urinary frequency    takes Flomax daily    Past Surgical History:  Procedure Laterality Date   BACK SURGERY     CATARACT EXTRACTION W/ INTRAOCULAR LENS  IMPLANT, BILATERAL Bilateral    COLONOSCOPY     CORONARY ANGIOGRAPHY N/A 11/12/2017   Procedure: CORONARY ANGIOGRAPHY;  Surgeon: Troy Sine, MD;  Location: [facility];  Service: Cardiovascular;  Laterality: N/A;   CORONARY STENT INTERVENTION N/A 11/12/2017   Procedure: CORONARY STENT INTERVENTION;  Surgeon: Troy Sine, MD;  Location: [facility];  Service: Cardiovascular;  Laterality: N/A;   CORONARY/GRAFT ACUTE MI REVASCULARIZATION N/A 11/10/2017   Procedure: Coronary/Graft Acute MI Revascularization;  Surgeon: Troy Sine, MD;  Location: [facility];  Service: Cardiovascular;  Laterality: N/A;   EYE SURGERY     GLAUCOMA SURGERY Bilateral    INSERTION / PLACEMENT / REVISION NEUROSTIMULATOR  2000s   KNEE ARTHROSCOPY Right 1990s   LEFT HEART CATH AND CORONARY ANGIOGRAPHY N/A 11/10/2017   Procedure: LEFT HEART CATH AND CORONARY ANGIOGRAPHY;  Surgeon: Troy Sine, MD;  Location: [facility];  Service: Cardiovascular;  Laterality: N/A;   MINI SHUNT INSERTION  09/26/2011   Procedure: INSERTION OF MINI  SHUNT;  Surgeon: Marylynn Pearson, MD;  Location: [facility];  Service: Ophthalmology;  Laterality: Right;  Insertion of Ahmed shunt   POSTERIOR FUSION LUMBAR SPINE  1990s   REVERSE SHOULDER ARTHROPLASTY Right 09/21/2013   Procedure: RIGHT  SHOULDER ARTHROPLASTY REVERSE ;  Surgeon: Augustin Schooling, MD;  Location: [facility];  Service: Orthopedics;  Laterality: Right;   SHOULDER INJECTION Left 09/21/2013   Procedure: SHOULDER INJECTION;  Surgeon: Augustin Schooling, MD;  Location: [facility];  Service: Orthopedics;  Laterality: Left;    Current Medications: Current Meds  Medication Sig   acetaminophen (TYLENOL) 325 MG tablet Take 2 tablets (650 mg total) by  mouth every 6 (six) hours.   amLODipine (NORVASC) 5 MG tablet Take 1 tablet (5 mg total) by mouth daily.   aspirin EC 81 MG EC tablet Take 1 tablet (81 mg total) by mouth daily.   atorvastatin (LIPITOR) 80 MG tablet Take 1 tablet (80 mg total) by mouth daily.   BELSOMRA 10 MG TABS Take 1 tablet by mouth at bedtime as needed.   cefdinir (OMNICEF) 300 MG capsule Take 300 mg by mouth every 12 (twelve) hours.   furosemide (LASIX) 20 MG tablet Take 20 mg by mouth daily.   insulin aspart (NOVOLOG) 100 UNIT/ML injection Inject 0-9 Units into the skin 3 (three) times daily with meals.   Menthol-Camphor 16-11 % CREA Apply 1 application topically as needed (knee pain).   metoprolol succinate (TOPROL-XL) 25 MG 24 hr tablet TAKE 1 TABLET BY MOUTH EVERY DAY   nitroGLYCERIN (NITROSTAT) 0.4 MG SL tablet Dissolve 1 tab under tongue as needed for chest pain. May repeat every 5 minutes x 2 more doses. If no relief call 9-1-1.   prednisoLONE acetate (PRED FORTE) 1 % ophthalmic suspension Place 1 drop into the right eye at bedtime.   PSYLLIUM PO Take 1 packet by mouth daily.   sertraline (ZOLOFT) 50 MG tablet Take 1 tablet (50 mg total) by mouth at bedtime.   timolol (TIMOPTIC) 0.5 % ophthalmic solution Place 1 drop into both eyes daily.   TRAVATAN Z 0.004 % SOLN ophthalmic  solution Place 1 drop into the left eye at bedtime.   [DISCONTINUED] furosemide (LASIX) 40 MG tablet Take 1 tablet (40 mg total) by mouth daily. (Patient taking differently: Take 20 mg by mouth daily.)     Allergies:   Patient has no known allergies.   Social History   Socioeconomic History   Marital status: Widowed    Spouse name: Not on file   Number of children: Not on file   Years of education: Not on file   Highest education level: Not on file  Occupational History   Not on file  Tobacco Use   Smoking status: Former    Packs/day: 2.00    Types: Cigarettes   Smokeless tobacco: Never   Tobacco comments:    11/12/2017 "nothing since the 1980s"  Vaping Use   Vaping Use: Never used  Substance and Sexual Activity   Alcohol use: Not Currently    Comment: 11/12/2017 "nothing since the 1980s"   Drug use: Never   Sexual activity: Not Currently  Other Topics Concern   Not on file  Social History Narrative   Legally blind.   Social Determinants of Health   Financial Resource Strain: Medium Risk (02/05/2018)   Overall Financial Resource Strain (CARDIA)    Difficulty of Paying Living Expenses: Somewhat hard  Food Insecurity: Unknown (02/06/2018)   Hunger Vital Sign    Worried About Running Out of Food in the Last Year: Patient refused    Ran Out of Food in the Last Year: Patient refused  Transportation Needs: Unknown (02/06/2018)   PRAPARE - Administrator, Civil Service (Medical): Patient refused    Lack of Transportation (Non-Medical): Patient refused  Physical Activity: Unknown (02/06/2018)   Exercise Vital Sign    Days of Exercise per Week: Patient refused    Minutes of Exercise per Session: Patient refused  Stress: No Stress Concern Present (02/06/2018)   [employer] of Occupational Health - Occupational Stress Questionnaire    Feeling of Stress : Only a little  Social Connections: Unknown (02/06/2018)   Social Connection and Isolation Panel [NHANES]     Frequency of Communication with Friends and Family: Patient refused    Frequency of Social Gatherings with Friends and Family: Patient refused    Attends Religious Services: Patient refused    Database administrator or Organizations: Patient refused    Attends Banker Meetings: Patient refused    Marital Status: Patient refused     Family History: The patient's family history includes Alzheimer's disease in his maternal aunt; Cancer in his mother. There is no history of Anesthesia problems.  ROS:   Please see the history of present illness.     All other systems reviewed and are negative.  EKGs/Labs/Other Studies Reviewed:    The following studies were reviewed today:  Left Heart Cath 11/10/17 Mid RCA lesion is 95% stenosed. Dist RCA lesion is 80% stenosed. Prox Cx to Mid Cx lesion is 90% stenosed. Ost 2nd Mrg to 2nd Mrg lesion is 100% stenosed. 2nd Mrg lesion is 95% stenosed. 1st Diag lesion is 95% stenosed. Mid LAD lesion is 80% stenosed. Dist LAD lesion is 90% stenosed. Post intervention, there is a 0% residual stenosis. Post intervention, there is a 0% residual stenosis. Post intervention, there is a 0% residual stenosis. A stent was successfully placed. A stent was successfully placed. A stent was successfully placed.   Acute inferolateral ST segment elevation myocardial infarction secondary to total occlusion of the circumflex vessel.   Multivessel CAD with 30% proximal and 95% distal diagonal 1 stenoses of the LAD, 80% mid distal and 90% distal LAD stenoses, and large dominant RCA with 95% mid stenosis and 80% distal stenosis prior to the PDA takeoff.     Successful complex percutaneous coronary intervention involving the proximal circumflex, proximal large OM vessel and distal inferior branch of this vessel after a large additional branch which cannot be visualized on the diagram above.  A 2.0 x 12 mm Resolute Onyx DES stent was placed in the distal inferior  secondary branch of the marginal vessel and reduced to 0%; the site of 100% occlusion was treated with PTCA/DES stenting with a 2.5 x 18 mm Resolute DES stent positioned just proximal to the more distal bifurcation into a large branch and distal inferior branch with the 100% occlusion being reduced to 0% with post dilatation up to 3.0 mm; and the proximal 90% stenosis on a sharp bend of the vessel being reduced to 0% with ultimate insertion of a 3.0 x 18 mm Resolute stent postdilated to 3.34 mm.   LVEDP  24 mm Hg.   RECOMMENDATION: DAPT therapy for minimum of 1 year, and probably indefinitely.  Once the patient blood pressure is stable we will initiate beta-blocker and ACE/arm therapy.  A 2D echo Doppler study will be done to assess LV function.  The patient will be hydrated post procedure.  On Tuesday, November 12, 2017 if renal function is stable, plan stageD PCI to the mid and distal RCA with relook at the left system. Diagnostic Dominance: Right  Intervention    Echocardiogram 11/11/17  - Left ventricle: The cavity size was normal. There was moderate    concentric hypertrophy. Systolic function was mildly to    moderately reduced. The estimated ejection fraction was in the    range of 40% to 45%. Doppler parameters are consistent with    abnormal left ventricular relaxation (grade 1 diastolic    dysfunction). There was no evidence of elevated ventricular  filling pressure by Doppler parameters.  - Aortic valve: Valve area (VTI): 2.08 cm^2. Valve area (Vmax):    2.13 cm^2. Valve area (Vmean): 1.98 cm^2.  - Aortic root: The aortic root was normal in size.  - Mitral valve: Valve area by pressure half-time: 1.28 cm^2.  - Left atrium: The atrium was normal in size.  - Right ventricle: The cavity size was normal. Wall thickness was    normal. Systolic function was normal.  - Right atrium: The atrium was normal in size.  - Tricuspid valve: There was no regurgitation.  - Pulmonary arteries:  Systolic pressure could not be accurately    estimated.  - Inferior vena cava: The vessel was normal in size.  - Pericardium, extracardiac: There was no pericardial effusion.   Impressions:   - There is akinesis of the basal and mid inferior, inefrolateral    and apical inferior leads.   LHC 11/12/17 1st Diag lesion is 95% stenosed. Mid LAD lesion is 70% stenosed. Dist LAD lesion is 85% stenosed. Previously placed 2nd Mrg stent (unknown type) is widely patent. Previously placed Ost 2nd Mrg to 2nd Mrg stent (unknown type) is widely patent. Previously placed Prox Cx to Mid Cx stent (unknown type) is widely patent. Dist RCA lesion is 80% stenosed. Post intervention, there is a 0% residual stenosis. Prox RCA to Mid RCA lesion is 30% stenosed. Mid RCA lesion is 95% stenosed. A stent was successfully placed. Post intervention, there is a 0% residual stenosis. Post intervention, there is a 0% residual stenosis. A stent was successfully placed.   Re-look of the left coronary circulation revealed widely patent stents extending from the proximal circumflex into the marginal vessel and also into the distal branch without restenosis.  The LAD had previously demonstrated 95% distal stenosis in a small caliber diagonal vessel, and the mid distal to distal LAD lesions appeared slightly improved now 70 and 85%.   Successful PCI to the RCA with DES stenting of the distal 80% stenosis reduced to 0% with a 2.25 x 12 mm Resolute Onyx stent postdilated to 2.4 mm, and the mid RCA stenosis of 95% with proximal eccentric narrowing stented with a 3.0 x 26 mm Resolute stent postdilated to 3.36 tapering to 3.25 with the entire stenosis being reduced to 0%.   RECOMMENDATION: DAPT for minimum of 1 year.  The patient will be started on carvedilol in addition to amlodipine in light of his hypertension as well as concomitant CAD.  He is on high potency statin therapy.  Diagnostic Dominance:  Right  Intervention     Recent Labs: 12/21/2021: BUN 15; Creatinine, Ser 1.20; Potassium 3.6; Sodium 138  Recent Lipid Panel    Component Value Date/Time   CHOL 138 11/11/2017 0217   TRIG 72 11/11/2017 0217   HDL 36 (L) 11/11/2017 0217   CHOLHDL 3.8 11/11/2017 0217   VLDL 14 11/11/2017 0217   LDLCALC 88 11/11/2017 0217     Risk Assessment/Calculations:                Physical Exam:    VS:  BP 122/72   Pulse 78   Ht 5\' 10"  (1.778 m)   Wt 221 lb 12.8 oz (100.6 kg)   SpO2 99%   BMI 31.82 kg/m     Wt Readings from Last 3 Encounters:  06/27/22 221 lb 12.8 oz (100.6 kg)  11/01/21 215 lb 12.8 oz (97.9 kg)  04/07/21 230 lb (104.3 kg)     GEN:  Elderly  male, sitting comfortably on the exam table. No acute distress  HEENT: Normal NECK: No JVD at 90 degrees  LYMPHATICS: No lymphadenopathy CARDIAC: RRR, no murmurs, rubs, gallops RESPIRATORY:  Fine crackles in bilateral lung bases. Normal WOB on room air  ABDOMEN: Soft, non-tender, non-distended MUSCULOSKELETAL:  No edema in BLE; No deformity  SKIN: Warm and dry NEUROLOGIC:  Alert and oriented x 3 PSYCHIATRIC:  Normal affect   ASSESSMENT:    1. Chronic combined systolic and diastolic CHF (congestive heart failure) (HCC)   2. Coronary artery disease involving native coronary artery of native heart without angina pectoris   3. HTN (hypertension), benign   4. Pure hypercholesterolemia   5. Frequent UTI    PLAN:    In order of problems listed above:  Chronic Combined Systolic and Diastolic Heart Failure  Shortness of Breath on Exertion  - Echocardiogram in 11/2017 showed EF 40-45%, grade I diastolic dysfunction  - Patient has been on metoprolol succinate 25 mg daily. Also on lasix 20 mg daily  - Today, patient complains of shortness of breath on minimal exertion and orthopnea. Occasionally has ankle edema that improves with elevating feet. On exam, patient has crackles in bilateral lung bases. In the past,  breathing has improved with increased doses of lasix.   - Increase lasix to 40 mg daily for 3 days, then go back to 20 mg daily. May ultimately need to be on an increased dose of lasix in the future  - Ordered BNP to be drawn today  - Ordered BMP to be drawn today to get baseline renal function/electrolytes. Ordered BMP to be drawn in 1 week to reassess after increased dose of lasix  - Pending BMP today, may add K supplementation for the next 3 days  - Son believes that his father needs supplemental oxygen. Pulse ox today showed oxygen was 99% on room air, then dropped to 90%, then increased back to 93%. Asked son to purchase a pulse ox and to check patient's oxygen saturation regularly for the next 2 weeks - If SOB does not improved with increased dose of lasix, may consider repeating a limited echo to guide GDMT. Patient is not a candidate for cardiac catheterization  - Close follow up in 2 weeks  - Plan to add additional GDMT at follow up appointment. Note, patient sedentary and has frequent UTIs, no SGLT2  CAD  - Patient had an acute inferolateral STEMI in 11/2017-- treated with PCI with DESx3 to the prox Lcx, prox OM1, and distal OM1. A few days later underwent staged PCI to the mid-distal RCA with DES x2 - Patient has been on ASA, lipitor 80 mg daily, and metoprolol succinate 25 mg daily  - No complaints of chest pain   HTN  - initial BP in office 138/80, recheck 122/72  - Continue amlodipine 5 mg daily, metoprolol succinate 25 mg daily   HLD with LDL goal <70  - Last lipid panel in 10/2020 showed LDL 77 - Given age, hold off on rechecking lipid panel  - Continue Lipitor 80 mg daily   Frequent UTIs - So reports that patient has 4-5 UTIs per month, sometimes hallucinates when he has UTI. Requests referral to urology.  - Referral placed     Medication Adjustments/Labs and Tests Ordered: Current medicines are reviewed at length with the patient today.  Concerns regarding medicines are  outlined above.  Orders Placed This Encounter  Procedures   Basic Metabolic Panel (BMET)   Basic Metabolic Panel (  BMET)   Pro b natriuretic peptide (BNP)   Ambulatory referral to Urology   No orders of the defined types were placed in this encounter.   Patient Instructions  Medication Instructions:  Your physician has recommended you make the following change in your medication:  INCREASE: Lasix to 40mg  for the next 3 days then go back to your normal dosage for 20mg  daily.  *If you need a refill on your cardiac medications before your next appointment, please call your pharmacy*   Lab Work: Your physician recommends that you have the following labs drawn today: BMP and BNP. Your physician recommends that you return in 1 week to have a repeat BMP.  If you have labs (blood work) drawn today and your tests are completely normal, you will receive your results only by: [place] (if you have MyChart) OR A paper copy in the mail If you have any lab test that is abnormal or we need to change your treatment, we will call you to review the results.   Testing/Procedures: NONE   Follow-Up: At [facility], you and your health needs are our priority.  As part of our continuing mission to provide you with exceptional heart care, we have created designated Provider Care Teams.  These Care Teams include your primary Cardiologist (physician) and Advanced Practice Providers (APPs -  Physician Assistants and Nurse Practitioners) who all work together to provide you with the care you need, when you need it.  We recommend signing up for the patient portal called "MyChart".  Sign up information is provided on this After Visit Summary.  MyChart is used to connect with patients for Virtual Visits (Telemedicine).  Patients are able to view lab/test results, encounter notes, upcoming appointments, etc.  Non-urgent messages can be sent to your provider as well.   To learn more about what you  can do with MyChart, go to [email or web address].    Your next appointment:   2 week(s)  Provider:   Fabian Sharp, PA-C        Signed, Margie Billet, PA-C  06/27/2022 10:31 AM    [place]

## 2022-06-27 ENCOUNTER — Ambulatory Visit: Payer: Medicare HMO | Attending: Cardiovascular Disease | Admitting: Physician Assistant

## 2022-06-27 ENCOUNTER — Encounter: Payer: Self-pay | Admitting: Physician Assistant

## 2022-06-27 VITALS — BP 122/72 | HR 78 | Ht 70.0 in | Wt 221.8 lb

## 2022-06-27 DIAGNOSIS — E7800 Pure hypercholesterolemia, unspecified: Secondary | ICD-10-CM | POA: Diagnosis not present

## 2022-06-27 DIAGNOSIS — N390 Urinary tract infection, site not specified: Secondary | ICD-10-CM

## 2022-06-27 DIAGNOSIS — I5042 Chronic combined systolic (congestive) and diastolic (congestive) heart failure: Secondary | ICD-10-CM

## 2022-06-27 DIAGNOSIS — I10 Essential (primary) hypertension: Secondary | ICD-10-CM | POA: Diagnosis not present

## 2022-06-27 DIAGNOSIS — I2510 Atherosclerotic heart disease of native coronary artery without angina pectoris: Secondary | ICD-10-CM | POA: Diagnosis not present

## 2022-06-27 NOTE — Patient Instructions (Signed)
Medication Instructions:  Your physician has recommended you make the following change in your medication:  INCREASE: Lasix to 40mg  for the next 3 days then go back to your normal dosage for 20mg  daily.  *If you need a refill on your cardiac medications before your next appointment, please call your pharmacy*   Lab Work: Your physician recommends that you have the following labs drawn today: BMP and BNP. Your physician recommends that you return in 1 week to have a repeat BMP.  If you have labs (blood work) drawn today and your tests are completely normal, you will receive your results only by: [place] (if you have MyChart) OR A paper copy in the mail If you have any lab test that is abnormal or we need to change your treatment, we will call you to review the results.   Testing/Procedures: NONE   Follow-Up: At [facility], you and your health needs are our priority.  As part of our continuing mission to provide you with exceptional heart care, we have created designated Provider Care Teams.  These Care Teams include your primary Cardiologist (physician) and Advanced Practice Providers (APPs -  Physician Assistants and Nurse Practitioners) who all work together to provide you with the care you need, when you need it.  We recommend signing up for the patient portal called "MyChart".  Sign up information is provided on this After Visit Summary.  MyChart is used to connect with patients for Virtual Visits (Telemedicine).  Patients are able to view lab/test results, encounter notes, upcoming appointments, etc.  Non-urgent messages can be sent to your provider as well.   To learn more about what you can do with MyChart, go to [email or web address].    Your next appointment:   2 week(s)  Provider:   Fabian Sharp, PA-C

## 2022-06-28 LAB — PRO B NATRIURETIC PEPTIDE: NT-Pro BNP: 268 pg/mL (ref 0–486)

## 2022-06-28 LAB — BASIC METABOLIC PANEL
BUN/Creatinine Ratio: 13 (ref 10–24)
BUN: 15 mg/dL (ref 8–27)
CO2: 27 mmol/L (ref 20–29)
Calcium: 9.5 mg/dL (ref 8.6–10.2)
Chloride: 102 mmol/L (ref 96–106)
Creatinine, Ser: 1.15 mg/dL (ref 0.76–1.27)
Glucose: 101 mg/dL — ABNORMAL HIGH (ref 70–99)
Potassium: 4.1 mmol/L (ref 3.5–5.2)
Sodium: 140 mmol/L (ref 134–144)
eGFR: 61 mL/min/{1.73_m2} (ref >59)

## 2022-07-04 ENCOUNTER — Encounter: Payer: Medicare HMO | Admitting: Urology

## 2022-07-04 ENCOUNTER — Encounter: Payer: Self-pay | Admitting: Urology

## 2022-07-04 NOTE — Progress Notes (Deleted)
Cardiology Office Note:    Date:  07/04/2022   ID:  [name], DOB 1931/05/08, MRN [id number]  PCP:  Roselee Nova, MD   [place] Providers Cardiologist:  Sanda Klein, MD { Click to update primary MD,subspecialty MD or APP then REFRESH:1}    Referring MD: Roselee Nova, MD   No chief complaint on file. ***  History of Present Illness:    [name] is a 89 y.o. male with a hx of CAD, chronic combined CHF, trifascicular block, syncope, hypertension, hyperlipidemia, DM2, and CKD stage III.  Heart catheterization in 2019 in the setting of STEMI revealed multivessel disease treated with PCI/DES to LCX and OM 2. High-grade lesions in the RCA treated as a staged PCI requiring DES x 3, 95% distal D1 lesion and distal LAD stenosis managed medically.  Echocardiogram 2019 showed LVEF 40-45%, grade 1 DD, moderate LVH, no significant valvular disease.   He is wheelchair-bound and sedentary.  He was last seen by [name] 11/01/2021 and reported leg swelling.  Dyspnea on exertion felt likely secondary to deconditioning and sedentary lifestyle.  Lower extremity edema likely dependent edema.  He was seen for follow up on 06/27/22 by [name] PAC. His son was with him and provided most of the history.  He complained of dyspnea on exertion but not shortness of breath at rest.  Of note, the patient only walks about 5 feet from his bed to the bathroom and back.  He is quite sedentary.  He is maintained on 20 mg of Lasix daily.  I opted to increase his Lasix for a short course, noting that he may ultimately require a higher dose of maintenance diuretic.  O2 sat on exam was 99% on room air.  He presents for close follow up.    Chronic systolic and diastolic heart failure Hypertension LVEF 40-45%, grade 1 DD, LVH Maintained on daily Lasix 20 mg and 25 mg toprol daily Not a candidate for SGLT2i given sedentary lifestyle and frequent UTIs   CAD STEMI in  2019 treated with DES-LCX, DES-OM2, DES-RCA Continue ASA, BB, 80 mg lipitor   Hyperlipidemia with LDL goal < 70 No recent lipid panel - given age, will hold off on rechecking Continue 80 mg lipitor   Frequent UTIs They reported 4-5 UTIs per month with some hallucinations with acute infection. Referral was placed for urology at last visit      Past Medical History:  Diagnosis Date   Arthritis    "all over" (11/12/2017)   CAD (coronary artery disease) 11/11/2017   S/p IL STEMI 6/19:  S/p DES to pLCx and DES x 2 to OM2 // staged PCI 6/19:  DES x 2 to RCA // residual CAD at cath 6/19:  mLAD 80, dLAD 90, D1 95 tx medically // EF 40-45 by echo   Chronic combined systolic and diastolic CHF (congestive heart failure) ([place])    ischemic CM // s/p MI in 11/2017 >> Echo 6/19:  Moderate concentric LVH, EF 40-08, grade 1 diastolic dysfunction, inferior, inferolateral, apical inferior akinesis    Chronic lower back pain    CKD (chronic kidney disease), stage III (HCC)    Constipation    takes an OTC med as needed   Diabetic nephropathy (HCC)    Gastric ulcer 1970s   Glaucoma, both eyes    uses eye drops daily   Gout    takes Allopurinol daily   History of colon polyps  History of ST elevation myocardial infarction (STEMI) 11/10/2017   PCI to LCx and staged PCI to RCA   History of stress test    a. 09/2014 MV: EF 53%, no ischemia/infarct.   Hyperlipidemia    takes Pravastatin daily   Hypertension    takes Amlodipine and Losartan daily   Insomnia    uses OTC sleep aide   Joint pain    Joint swelling    right knee    Legally blind    Macular degeneration    Morbid obesity ([place])    Nocturia    Peripheral edema    Pleurisy 20+yrs ago   Pneumonia    "I think once when I was a kid" (11/12/2017)   Sleep apnea    Stroke ([place])    "I've had them; don't know anything about them"; denies residual on 11/12/2017)   Syncope    a. 09/2014 in setting of orthostasis; b. 10/2014 Event monitor: 19  beat run of asymp NSVT; c. 01/2015 Echo: EF 60-65%, no rwma.   Type II diabetes mellitus (HCC)    takes Metformin daily   Urinary frequency    takes Flomax daily    Past Surgical History:  Procedure Laterality Date   BACK SURGERY     CATARACT EXTRACTION W/ INTRAOCULAR LENS  IMPLANT, BILATERAL Bilateral    COLONOSCOPY     CORONARY ANGIOGRAPHY N/A 11/12/2017   Procedure: CORONARY ANGIOGRAPHY;  Surgeon: Troy Sine, MD;  Location: [facility];  Service: Cardiovascular;  Laterality: N/A;   CORONARY STENT INTERVENTION N/A 11/12/2017   Procedure: CORONARY STENT INTERVENTION;  Surgeon: Troy Sine, MD;  Location: [facility];  Service: Cardiovascular;  Laterality: N/A;   CORONARY/GRAFT ACUTE MI REVASCULARIZATION N/A 11/10/2017   Procedure: Coronary/Graft Acute MI Revascularization;  Surgeon: Troy Sine, MD;  Location: [facility];  Service: Cardiovascular;  Laterality: N/A;   EYE SURGERY     GLAUCOMA SURGERY Bilateral    INSERTION / PLACEMENT / REVISION NEUROSTIMULATOR  2000s   KNEE ARTHROSCOPY Right 1990s   LEFT HEART CATH AND CORONARY ANGIOGRAPHY N/A 11/10/2017   Procedure: LEFT HEART CATH AND CORONARY ANGIOGRAPHY;  Surgeon: Troy Sine, MD;  Location: [facility];  Service: Cardiovascular;  Laterality: N/A;   MINI SHUNT INSERTION  09/26/2011   Procedure: INSERTION OF MINI SHUNT;  Surgeon: Marylynn Pearson, MD;  Location: [facility];  Service: Ophthalmology;  Laterality: Right;  Insertion of Ahmed shunt   POSTERIOR FUSION LUMBAR SPINE  1990s   REVERSE SHOULDER ARTHROPLASTY Right 09/21/2013   Procedure: RIGHT  SHOULDER ARTHROPLASTY REVERSE ;  Surgeon: Augustin Schooling, MD;  Location: [facility];  Service: Orthopedics;  Laterality: Right;   SHOULDER INJECTION Left 09/21/2013   Procedure: SHOULDER INJECTION;  Surgeon: Augustin Schooling, MD;  Location: [facility];  Service: Orthopedics;  Laterality: Left;    Current Medications: No outpatient medications have been marked as  taking for the 07/12/22 encounter (Appointment) with Ledora Bottcher, [place].     Allergies:   Patient has no known allergies.   Social History   Socioeconomic History   Marital status: Widowed    Spouse name: Not on file   Number of children: Not on file   Years of education: Not on file   Highest education level: Not on file  Occupational History   Not on file  Tobacco Use   Smoking status: Former    Packs/day: 2.00    Types: Cigarettes  Smokeless tobacco: Never   Tobacco comments:    11/12/2017 "nothing since the 1980s"  Vaping Use   Vaping Use: Never used  Substance and Sexual Activity   Alcohol use: Not Currently    Comment: 11/12/2017 "nothing since the 1980s"   Drug use: Never   Sexual activity: Not Currently  Other Topics Concern   Not on file  Social History Narrative   Legally blind.   Social Determinants of Health   Financial Resource Strain: Medium Risk (02/05/2018)   Overall Financial Resource Strain (CARDIA)    Difficulty of Paying Living Expenses: Somewhat hard  Food Insecurity: Unknown (02/06/2018)   Hunger Vital Sign    Worried About Running Out of Food in the Last Year: Patient refused    [place] in the Last Year: Patient refused  Transportation Needs: Unknown (02/06/2018)   [place] - Hydrologist (Medical): Patient refused    Lack of Transportation (Non-Medical): Patient refused  Physical Activity: Unknown (02/06/2018)   Exercise Vital Sign    Days of Exercise per Week: Patient refused    Minutes of Exercise per Session: Patient refused  Stress: No Stress Concern Present (02/06/2018)   [place]    Feeling of Stress : Only a little  Social Connections: Unknown (02/06/2018)   Social Connection and Isolation Panel [NHANES]    Frequency of Communication with Friends and Family: Patient refused    Frequency of Social Gatherings with Friends and Family:  Patient refused    Attends Religious Services: Patient refused    Marine scientist or Organizations: Patient refused    Attends Archivist Meetings: Patient refused    Marital Status: Patient refused     Family History: The patient's ***family history includes Alzheimer's disease in his maternal aunt; Cancer in his mother. There is no history of Anesthesia problems.  ROS:   Please see the history of present illness.    *** All other systems reviewed and are negative.  EKGs/Labs/Other Studies Reviewed:    The following studies were reviewed today:  Left Heart Cath 11/10/17 Mid RCA lesion is 95% stenosed. Dist RCA lesion is 80% stenosed. Prox Cx to Mid Cx lesion is 90% stenosed. Ost 2nd Mrg to 2nd Mrg lesion is 100% stenosed. 2nd Mrg lesion is 95% stenosed. 1st Diag lesion is 95% stenosed. Mid LAD lesion is 80% stenosed. Dist LAD lesion is 90% stenosed. Post intervention, there is a 0% residual stenosis. Post intervention, there is a 0% residual stenosis. Post intervention, there is a 0% residual stenosis. A stent was successfully placed. A stent was successfully placed. A stent was successfully placed.   Acute inferolateral ST segment elevation myocardial infarction secondary to total occlusion of the circumflex vessel.   Multivessel CAD with 30% proximal and 95% distal diagonal 1 stenoses of the LAD, 80% mid distal and 90% distal LAD stenoses, and large dominant RCA with 95% mid stenosis and 80% distal stenosis prior to the PDA takeoff.     Successful complex percutaneous coronary intervention involving the proximal circumflex, proximal large OM vessel and distal inferior branch of this vessel after a large additional branch which cannot be visualized on the diagram above.  A 2.0 x 12 mm Resolute Onyx DES stent was placed in the distal inferior secondary branch of the marginal vessel and reduced to 0%; the site of 100% occlusion was treated with PTCA/DES stenting  with a 2.5 x  18 mm Resolute DES stent positioned just proximal to the more distal bifurcation into a large branch and distal inferior branch with the 100% occlusion being reduced to 0% with post dilatation up to 3.0 mm; and the proximal 90% stenosis on a sharp bend of the vessel being reduced to 0% with ultimate insertion of a 3.0 x 18 mm Resolute stent postdilated to 3.34 mm.   LVEDP  24 mm Hg.   RECOMMENDATION: DAPT therapy for minimum of 1 year, and probably indefinitely.  Once the patient blood pressure is stable we will initiate beta-blocker and ACE/arm therapy.  A 2D echo Doppler study will be done to assess LV function.  The patient will be hydrated post procedure.  On Tuesday, November 12, 2017 if renal function is stable, plan stageD PCI to the mid and distal RCA with relook at the left system.  Diagnostic Dominance: Right  Intervention      Echocardiogram 11/11/17  - Left ventricle: The cavity size was normal. There was moderate    concentric hypertrophy. Systolic function was mildly to    moderately reduced. The estimated ejection fraction was in the    range of 40% to 45%. Doppler parameters are consistent with    abnormal left ventricular relaxation (grade 1 diastolic    dysfunction). There was no evidence of elevated ventricular    filling pressure by Doppler parameters.  - Aortic valve: Valve area (VTI): 2.08 cm^2. Valve area (Vmax):    2.13 cm^2. Valve area (Vmean): 1.98 cm^2.  - Aortic root: The aortic root was normal in size.  - Mitral valve: Valve area by pressure half-time: 1.28 cm^2.  - Left atrium: The atrium was normal in size.  - Right ventricle: The cavity size was normal. Wall thickness was    normal. Systolic function was normal.  - Right atrium: The atrium was normal in size.  - Tricuspid valve: There was no regurgitation.  - Pulmonary arteries: Systolic pressure could not be accurately    estimated.  - Inferior vena cava: The vessel was normal in size.  -  Pericardium, extracardiac: There was no pericardial effusion.   Impressions:   - There is akinesis of the basal and mid inferior, inefrolateral    and apical inferior leads.    LHC 11/12/17 1st Diag lesion is 95% stenosed. Mid LAD lesion is 70% stenosed. Dist LAD lesion is 85% stenosed. Previously placed 2nd Mrg stent (unknown type) is widely patent. Previously placed Ost 2nd Mrg to 2nd Mrg stent (unknown type) is widely patent. Previously placed Prox Cx to Mid Cx stent (unknown type) is widely patent. Dist RCA lesion is 80% stenosed. Post intervention, there is a 0% residual stenosis. Prox RCA to Mid RCA lesion is 30% stenosed. Mid RCA lesion is 95% stenosed. A stent was successfully placed. Post intervention, there is a 0% residual stenosis. Post intervention, there is a 0% residual stenosis. A stent was successfully placed.   Re-look of the left coronary circulation revealed widely patent stents extending from the proximal circumflex into the marginal vessel and also into the distal branch without restenosis.  The LAD had previously demonstrated 95% distal stenosis in a small caliber diagonal vessel, and the mid distal to distal LAD lesions appeared slightly improved now 70 and 85%.   Successful PCI to the RCA with DES stenting of the distal 80% stenosis reduced to 0% with a 2.25 x 12 mm Resolute Onyx stent postdilated to 2.4 mm, and the mid RCA stenosis of 95%  with proximal eccentric narrowing stented with a 3.0 x 26 mm Resolute stent postdilated to 3.36 tapering to 3.25 with the entire stenosis being reduced to 0%.   RECOMMENDATION: DAPT for minimum of 1 year.  The patient will be started on carvedilol in addition to amlodipine in light of his hypertension as well as concomitant CAD.  He is on high potency statin therapy.  Diagnostic Dominance: Right  Intervention         EKG:  EKG is *** ordered today.  The ekg ordered today demonstrates ***  Recent Labs: 06/27/2022: BUN  15; Creatinine, Ser 1.15; NT-Pro BNP 268; Potassium 4.1; Sodium 140  Recent Lipid Panel    Component Value Date/Time   CHOL 138 11/11/2017 0217   TRIG 72 11/11/2017 0217   HDL 36 (L) 11/11/2017 0217   CHOLHDL 3.8 11/11/2017 0217   VLDL 14 11/11/2017 0217   LDLCALC 88 11/11/2017 0217     Risk Assessment/Calculations:   {Does this patient have ATRIAL FIBRILLATION?:[phone]}  No BP recorded.  {Refresh Note OR Click here to enter BP  :1}***         Physical Exam:    VS:  There were no vitals taken for this visit.    Wt Readings from Last 3 Encounters:  06/27/22 221 lb 12.8 oz (100.6 kg)  11/01/21 215 lb 12.8 oz (97.9 kg)  04/07/21 230 lb (104.3 kg)     GEN: *** Well nourished, well developed in no acute distress HEENT: Normal NECK: No JVD; No carotid bruits LYMPHATICS: No lymphadenopathy CARDIAC: ***RRR, no murmurs, rubs, gallops RESPIRATORY:  Clear to auscultation without rales, wheezing or rhonchi  ABDOMEN: Soft, non-tender, non-distended MUSCULOSKELETAL:  No edema; No deformity  SKIN: Warm and dry NEUROLOGIC:  Alert and oriented x 3 PSYCHIATRIC:  Normal affect   ASSESSMENT:    No diagnosis found. PLAN:    In order of problems listed above:  ***      {Are you ordering a CV Procedure (e.g. stress test, cath, DCCV, TEE, etc)?   Press F2        :[id number]}    Medication Adjustments/Labs and Tests Ordered: Current medicines are reviewed at length with the patient today.  Concerns regarding medicines are outlined above.  No orders of the defined types were placed in this encounter.  No orders of the defined types were placed in this encounter.   There are no Patient Instructions on file for this visit.   Signed, Ledora Bottcher, PA  07/04/2022 1:49 PM    [facility]

## 2022-07-04 NOTE — Progress Notes (Deleted)
Assessment: 1. History of UTI   2. BPH with obstruction/lower urinary tract symptoms      Plan: ***  Chief Complaint: No chief complaint on file.   History of Present Illness:  [name] is a 89 y.o. male who is seen in consultation from Roselee Nova, MD for evaluation of ***.   Past Medical History:  Past Medical History:  Diagnosis Date   Arthritis    "all over" (11/12/2017)   CAD (coronary artery disease) 11/11/2017   S/p IL STEMI 6/19:  S/p DES to pLCx and DES x 2 to OM2 // staged PCI 6/19:  DES x 2 to RCA // residual CAD at cath 6/19:  mLAD 80, dLAD 90, D1 95 tx medically // EF 40-45 by echo   Chronic combined systolic and diastolic CHF (congestive heart failure) ([place])    ischemic CM // s/p MI in 11/2017 >> Echo 6/19:  Moderate concentric LVH, EF [id number], grade 1 diastolic dysfunction, inferior, inferolateral, apical inferior akinesis    Chronic lower back pain    CKD (chronic kidney disease), stage III (HCC)    Constipation    takes an OTC med as needed   Diabetic nephropathy (HCC)    Gastric ulcer 1970s   Glaucoma, both eyes    uses eye drops daily   Gout    takes Allopurinol daily   History of colon polyps    History of ST elevation myocardial infarction (STEMI) 11/10/2017   PCI to LCx and staged PCI to RCA   History of stress test    a. 09/2014 MV: EF 53%, no ischemia/infarct.   Hyperlipidemia    takes Pravastatin daily   Hypertension    takes Amlodipine and Losartan daily   Insomnia    uses OTC sleep aide   Joint pain    Joint swelling    right knee    Legally blind    Macular degeneration    Morbid obesity ([place])    Nocturia    Peripheral edema    Pleurisy 20+yrs ago   Pneumonia    "I think once when I was a kid" (11/12/2017)   Sleep apnea    Stroke ([place])    "I've had them; don't know anything about them"; denies residual on 11/12/2017)   Syncope    a. 09/2014 in setting of orthostasis; b. 10/2014 Event monitor: 19 beat run of asymp NSVT;  c. 01/2015 Echo: EF 60-65%, no rwma.   Type II diabetes mellitus (HCC)    takes Metformin daily   Urinary frequency    takes Flomax daily    Past Surgical History:  Past Surgical History:  Procedure Laterality Date   BACK SURGERY     CATARACT EXTRACTION W/ INTRAOCULAR LENS  IMPLANT, BILATERAL Bilateral    COLONOSCOPY     CORONARY ANGIOGRAPHY N/A 11/12/2017   Procedure: CORONARY ANGIOGRAPHY;  Surgeon: Troy Sine, MD;  Location: [facility];  Service: Cardiovascular;  Laterality: N/A;   CORONARY STENT INTERVENTION N/A 11/12/2017   Procedure: CORONARY STENT INTERVENTION;  Surgeon: Troy Sine, MD;  Location: [facility];  Service: Cardiovascular;  Laterality: N/A;   CORONARY/GRAFT ACUTE MI REVASCULARIZATION N/A 11/10/2017   Procedure: Coronary/Graft Acute MI Revascularization;  Surgeon: Troy Sine, MD;  Location: [facility];  Service: Cardiovascular;  Laterality: N/A;   EYE SURGERY     GLAUCOMA SURGERY Bilateral    INSERTION / PLACEMENT / REVISION NEUROSTIMULATOR  2000s  KNEE ARTHROSCOPY Right 1990s   LEFT HEART CATH AND CORONARY ANGIOGRAPHY N/A 11/10/2017   Procedure: LEFT HEART CATH AND CORONARY ANGIOGRAPHY;  Surgeon: Troy Sine, MD;  Location: [facility];  Service: Cardiovascular;  Laterality: N/A;   MINI SHUNT INSERTION  09/26/2011   Procedure: INSERTION OF MINI SHUNT;  Surgeon: Marylynn Pearson, MD;  Location: [facility];  Service: Ophthalmology;  Laterality: Right;  Insertion of Ahmed shunt   POSTERIOR FUSION LUMBAR SPINE  1990s   REVERSE SHOULDER ARTHROPLASTY Right 09/21/2013   Procedure: RIGHT  SHOULDER ARTHROPLASTY REVERSE ;  Surgeon: Augustin Schooling, MD;  Location: [facility];  Service: Orthopedics;  Laterality: Right;   SHOULDER INJECTION Left 09/21/2013   Procedure: SHOULDER INJECTION;  Surgeon: Augustin Schooling, MD;  Location: [facility];  Service: Orthopedics;  Laterality: Left;    Allergies:  No Known Allergies  Family History:  Family History   Problem Relation Age of Onset   Cancer Mother    Alzheimer's disease Maternal Aunt    Anesthesia problems Neg Hx     Social History:  Social History   Tobacco Use   Smoking status: Former    Packs/day: 2.00    Types: Cigarettes   Smokeless tobacco: Never   Tobacco comments:    11/12/2017 "nothing since the 1980s"  Vaping Use   Vaping Use: Never used  Substance Use Topics   Alcohol use: Not Currently    Comment: 11/12/2017 "nothing since the 1980s"   Drug use: Never    Review of symptoms:  Constitutional:  Negative for unexplained weight loss, night sweats, fever, chills ENT:  Negative for nose bleeds, sinus pain, painful swallowing CV:  Negative for chest pain, shortness of breath, exercise intolerance, palpitations, loss of consciousness Resp:  Negative for cough, wheezing, shortness of breath GI:  Negative for nausea, vomiting, diarrhea, bloody stools GU:  Positives noted in HPI; otherwise negative for gross hematuria, dysuria, urinary incontinence Neuro:  Negative for seizures, poor balance, limb weakness, slurred speech Psych:  Negative for lack of energy, depression, anxiety Endocrine:  Negative for polydipsia, polyuria, symptoms of hypoglycemia (dizziness, hunger, sweating) Hematologic:  Negative for anemia, purpura, petechia, prolonged or excessive bleeding, use of anticoagulants  Allergic:  Negative for difficulty breathing or choking as a result of exposure to anything; no shellfish allergy; no allergic response (rash/itch) to materials, foods  Physical exam: There were no vitals taken for this visit. GENERAL APPEARANCE:  Well appearing, well developed, well nourished, NAD HEENT: Atraumatic, Normocephalic, oropharynx clear. NECK: Supple without lymphadenopathy or thyromegaly. LUNGS: Clear to auscultation bilaterally. HEART: Regular Rate and Rhythm without murmurs, gallops, or rubs. ABDOMEN: Soft, non-tender, No Masses. EXTREMITIES: Moves all extremities well.   Without clubbing, cyanosis, or edema. NEUROLOGIC:  Alert and oriented x 3, normal gait, CN II-XII grossly intact.  MENTAL STATUS:  Appropriate. BACK:  Non-tender to palpation.  No CVAT SKIN:  Warm, dry and intact.    Results: No results found for this or any previous visit (from the past 24 hour(s)).  PVR:

## 2022-07-12 ENCOUNTER — Ambulatory Visit: Payer: Medicare HMO | Admitting: Physician Assistant

## 2022-07-18 ENCOUNTER — Ambulatory Visit: Payer: Medicare HMO | Admitting: Podiatry

## 2022-07-19 ENCOUNTER — Encounter: Payer: Medicare HMO | Admitting: Urology

## 2022-07-19 NOTE — Progress Notes (Deleted)
Assessment: 1. Frequent UTI     Plan: ***  Chief Complaint: No chief complaint on file.   History of Present Illness:  [name] is a 89 y.o. male who is seen in consultation from Roselee Nova, MD for evaluation of UTI's.   Past Medical History:  Past Medical History:  Diagnosis Date   Arthritis    "all over" (11/12/2017)   CAD (coronary artery disease) 11/11/2017   S/p IL STEMI 6/19:  S/p DES to pLCx and DES x 2 to OM2 // staged PCI 6/19:  DES x 2 to RCA // residual CAD at cath 6/19:  mLAD 80, dLAD 90, D1 95 tx medically // EF 40-45 by echo   Chronic combined systolic and diastolic CHF (congestive heart failure) ([place])    ischemic CM // s/p MI in 11/2017 >> Echo 6/19:  Moderate concentric LVH, EF [id number], grade 1 diastolic dysfunction, inferior, inferolateral, apical inferior akinesis    Chronic lower back pain    CKD (chronic kidney disease), stage III (HCC)    Constipation    takes an OTC med as needed   Diabetic nephropathy (HCC)    Gastric ulcer 1970s   Glaucoma, both eyes    uses eye drops daily   Gout    takes Allopurinol daily   History of colon polyps    History of ST elevation myocardial infarction (STEMI) 11/10/2017   PCI to LCx and staged PCI to RCA   History of stress test    a. 09/2014 MV: EF 53%, no ischemia/infarct.   Hyperlipidemia    takes Pravastatin daily   Hypertension    takes Amlodipine and Losartan daily   Insomnia    uses OTC sleep aide   Joint pain    Joint swelling    right knee    Legally blind    Macular degeneration    Morbid obesity ([place])    Nocturia    Peripheral edema    Pleurisy 20+yrs ago   Pneumonia    "I think once when I was a kid" (11/12/2017)   Sleep apnea    Stroke ([place])    "I've had them; don't know anything about them"; denies residual on 11/12/2017)   Syncope    a. 09/2014 in setting of orthostasis; b. 10/2014 Event monitor: 19 beat run of asymp NSVT; c. 01/2015 Echo: EF 60-65%, no rwma.   Type II diabetes  mellitus (HCC)    takes Metformin daily   Urinary frequency    takes Flomax daily    Past Surgical History:  Past Surgical History:  Procedure Laterality Date   BACK SURGERY     CATARACT EXTRACTION W/ INTRAOCULAR LENS  IMPLANT, BILATERAL Bilateral    COLONOSCOPY     CORONARY ANGIOGRAPHY N/A 11/12/2017   Procedure: CORONARY ANGIOGRAPHY;  Surgeon: Troy Sine, MD;  Location: [facility];  Service: Cardiovascular;  Laterality: N/A;   CORONARY STENT INTERVENTION N/A 11/12/2017   Procedure: CORONARY STENT INTERVENTION;  Surgeon: Troy Sine, MD;  Location: [facility];  Service: Cardiovascular;  Laterality: N/A;   CORONARY/GRAFT ACUTE MI REVASCULARIZATION N/A 11/10/2017   Procedure: Coronary/Graft Acute MI Revascularization;  Surgeon: Troy Sine, MD;  Location: [facility];  Service: Cardiovascular;  Laterality: N/A;   EYE SURGERY     GLAUCOMA SURGERY Bilateral    INSERTION / PLACEMENT / REVISION NEUROSTIMULATOR  2000s   KNEE ARTHROSCOPY Right 1990s   LEFT HEART CATH AND  CORONARY ANGIOGRAPHY N/A 11/10/2017   Procedure: LEFT HEART CATH AND CORONARY ANGIOGRAPHY;  Surgeon: Troy Sine, MD;  Location: [facility];  Service: Cardiovascular;  Laterality: N/A;   MINI SHUNT INSERTION  09/26/2011   Procedure: INSERTION OF MINI SHUNT;  Surgeon: Marylynn Pearson, MD;  Location: [facility];  Service: Ophthalmology;  Laterality: Right;  Insertion of Ahmed shunt   POSTERIOR FUSION LUMBAR SPINE  1990s   REVERSE SHOULDER ARTHROPLASTY Right 09/21/2013   Procedure: RIGHT  SHOULDER ARTHROPLASTY REVERSE ;  Surgeon: Augustin Schooling, MD;  Location: [facility];  Service: Orthopedics;  Laterality: Right;   SHOULDER INJECTION Left 09/21/2013   Procedure: SHOULDER INJECTION;  Surgeon: Augustin Schooling, MD;  Location: [facility];  Service: Orthopedics;  Laterality: Left;    Allergies:  No Known Allergies  Family History:  Family History  Problem Relation Age of Onset   Cancer Mother     Alzheimer's disease Maternal Aunt    Anesthesia problems Neg Hx     Social History:  Social History   Tobacco Use   Smoking status: Former    Packs/day: 2.00    Types: Cigarettes   Smokeless tobacco: Never   Tobacco comments:    11/12/2017 "nothing since the 1980s"  Vaping Use   Vaping Use: Never used  Substance Use Topics   Alcohol use: Not Currently    Comment: 11/12/2017 "nothing since the 1980s"   Drug use: Never    Review of symptoms:  Constitutional:  Negative for unexplained weight loss, night sweats, fever, chills ENT:  Negative for nose bleeds, sinus pain, painful swallowing CV:  Negative for chest pain, shortness of breath, exercise intolerance, palpitations, loss of consciousness Resp:  Negative for cough, wheezing, shortness of breath GI:  Negative for nausea, vomiting, diarrhea, bloody stools GU:  Positives noted in HPI; otherwise negative for gross hematuria, dysuria, urinary incontinence Neuro:  Negative for seizures, poor balance, limb weakness, slurred speech Psych:  Negative for lack of energy, depression, anxiety Endocrine:  Negative for polydipsia, polyuria, symptoms of hypoglycemia (dizziness, hunger, sweating) Hematologic:  Negative for anemia, purpura, petechia, prolonged or excessive bleeding, use of anticoagulants  Allergic:  Negative for difficulty breathing or choking as a result of exposure to anything; no shellfish allergy; no allergic response (rash/itch) to materials, foods  Physical exam: There were no vitals taken for this visit. GENERAL APPEARANCE:  Well appearing, well developed, well nourished, NAD HEENT: Atraumatic, Normocephalic, oropharynx clear. NECK: Supple without lymphadenopathy or thyromegaly. LUNGS: Clear to auscultation bilaterally. HEART: Regular Rate and Rhythm without murmurs, gallops, or rubs. ABDOMEN: Soft, non-tender, No Masses. EXTREMITIES: Moves all extremities well.  Without clubbing, cyanosis, or edema. NEUROLOGIC:   Alert and oriented x 3, normal gait, CN II-XII grossly intact.  MENTAL STATUS:  Appropriate. BACK:  Non-tender to palpation.  No CVAT SKIN:  Warm, dry and intact.    Results: No results found for this or any previous visit (from the past 24 hour(s)).

## 2022-07-30 ENCOUNTER — Ambulatory Visit: Payer: Medicare HMO | Admitting: Urology

## 2022-07-30 ENCOUNTER — Encounter: Payer: Self-pay | Admitting: Urology

## 2022-07-30 VITALS — BP 123/77 | HR 106 | Ht 70.0 in | Wt 227.0 lb

## 2022-07-30 DIAGNOSIS — N3942 Incontinence without sensory awareness: Secondary | ICD-10-CM | POA: Diagnosis not present

## 2022-07-30 DIAGNOSIS — Z87440 Personal history of urinary (tract) infections: Secondary | ICD-10-CM

## 2022-07-30 LAB — BLADDER SCAN AMB NON-IMAGING

## 2022-07-30 LAB — URINALYSIS
Bilirubin, UA: NEGATIVE
Blood, UA: NEGATIVE
Glucose, UA: NEGATIVE mg/dL
Ketones, UA: NEGATIVE
Nitrite, UA: NEGATIVE
Protein, UA: POSITIVE — AB
Spec Grav, UA: 1.02 (ref 1.010–1.025)
Urobilinogen, UA: 0.2 E.U./dL
pH, UA: 7 (ref 5.0–8.0)

## 2022-07-30 MED ORDER — MIRABEGRON ER 25 MG PO TB24: 25.0000 mg | ORAL_TABLET | Freq: Every day | ORAL | 0 refills | Status: DC

## 2022-07-30 NOTE — Progress Notes (Addendum)
Assessment: 1. History of UTI   2. Urinary incontinence without sensory awareness     Plan: I personally reviewed the patient's chart including available provider notes, and lab results. Request all available urine cultures from Dr. Manuella Ghazi at [facility] for review Trial of Myrbetriq 25 mg daily.  Samples given. Return to office in 1 month.  Addendum 08/14/22: Records from Dr. Manuella Ghazi reviewed. Urine culture from 11/23 showed 10-49K Morgan[place].  Treated with Cefdinir. No other urine culture results noted.  Chief Complaint:  Chief Complaint  Patient presents with   Recurrent UTI    History of Present Illness:  [name] is a 89 y.o. male who is seen in consultation from Keith Rake, MD for evaluation of UTI's and LUTS.  He reports a history of frequent UTIs, occurring 6 times per year.  His last infection was approximately 3 weeks ago.  He typically has hallucinations associated with the UTIs.  No fevers, chills, flank pain, gross hematuria, or dysuria.  These have apparently all been treated by his PCP.  No recent urine culture results available. Urine culture result from 11/22: >100 K Klebsiella. He is baseline symptoms of urinary frequency, voiding every hour, nocturia 3-4 times, urge incontinence and unaware incontinence.  He wears a diaper day and night.  He changes the diaper 4 times per day.  No dysuria or gross hematuria.  No history of kidney stones.  No recent imaging. IPSS = 9 today.  Past Medical History:  Past Medical History:  Diagnosis Date   Arthritis    "all over" (11/12/2017)   CAD (coronary artery disease) 11/11/2017   S/p IL STEMI 6/19:  S/p DES to pLCx and DES x 2 to OM2 // staged PCI 6/19:  DES x 2 to RCA // residual CAD at cath 6/19:  mLAD 80, dLAD 90, D1 95 tx medically // EF 40-45 by echo   Chronic combined systolic and diastolic CHF (congestive heart failure) ([place])    ischemic CM // s/p MI in 11/2017 >> Echo 6/19:  Moderate concentric LVH, EF 40-45,  grade 1 diastolic dysfunction, inferior, inferolateral, apical inferior akinesis    Chronic lower back pain    CKD (chronic kidney disease), stage III (HCC)    Constipation    takes an OTC med as needed   Diabetic nephropathy (HCC)    Gastric ulcer 1970s   Glaucoma, both eyes    uses eye drops daily   Gout    takes Allopurinol daily   History of colon polyps    History of ST elevation myocardial infarction (STEMI) 11/10/2017   PCI to LCx and staged PCI to RCA   History of stress test    a. 09/2014 MV: EF 53%, no ischemia/infarct.   Hyperlipidemia    takes Pravastatin daily   Hypertension    takes Amlodipine and Losartan daily   Insomnia    uses OTC sleep aide   Joint pain    Joint swelling    right knee    Legally blind    Macular degeneration    Morbid obesity ([place])    Nocturia    Peripheral edema    Pleurisy 20+yrs ago   Pneumonia    "I think once when I was a kid" (11/12/2017)   Sleep apnea    Stroke ([place])    "I've had them; don't know anything about them"; denies residual on 11/12/2017)   Syncope    a. 09/2014 in setting of orthostasis; b. 10/2014 Event  monitor: 19 beat run of asymp NSVT; c. 01/2015 Echo: EF 60-65%, no rwma.   Type II diabetes mellitus (HCC)    takes Metformin daily   Urinary frequency    takes Flomax daily    Past Surgical History:  Past Surgical History:  Procedure Laterality Date   BACK SURGERY     CATARACT EXTRACTION W/ INTRAOCULAR LENS  IMPLANT, BILATERAL Bilateral    COLONOSCOPY     CORONARY ANGIOGRAPHY N/A 11/12/2017   Procedure: CORONARY ANGIOGRAPHY;  Surgeon: Troy Sine, MD;  Location: [facility];  Service: Cardiovascular;  Laterality: N/A;   CORONARY STENT INTERVENTION N/A 11/12/2017   Procedure: CORONARY STENT INTERVENTION;  Surgeon: Troy Sine, MD;  Location: [facility];  Service: Cardiovascular;  Laterality: N/A;   CORONARY/GRAFT ACUTE MI REVASCULARIZATION N/A 11/10/2017   Procedure: Coronary/Graft Acute MI  Revascularization;  Surgeon: Troy Sine, MD;  Location: [facility];  Service: Cardiovascular;  Laterality: N/A;   EYE SURGERY     GLAUCOMA SURGERY Bilateral    INSERTION / PLACEMENT / REVISION NEUROSTIMULATOR  2000s   KNEE ARTHROSCOPY Right 1990s   LEFT HEART CATH AND CORONARY ANGIOGRAPHY N/A 11/10/2017   Procedure: LEFT HEART CATH AND CORONARY ANGIOGRAPHY;  Surgeon: Troy Sine, MD;  Location: [facility];  Service: Cardiovascular;  Laterality: N/A;   MINI SHUNT INSERTION  09/26/2011   Procedure: INSERTION OF MINI SHUNT;  Surgeon: Marylynn Pearson, MD;  Location: [place] [facility];  Service: Ophthalmology;  Laterality: Right;  Insertion of Ahmed shunt   POSTERIOR FUSION LUMBAR SPINE  1990s   REVERSE SHOULDER ARTHROPLASTY Right 09/21/2013   Procedure: RIGHT  SHOULDER ARTHROPLASTY REVERSE ;  Surgeon: Augustin Schooling, MD;  Location: [facility];  Service: Orthopedics;  Laterality: Right;   SHOULDER INJECTION Left 09/21/2013   Procedure: SHOULDER INJECTION;  Surgeon: Augustin Schooling, MD;  Location: [facility];  Service: Orthopedics;  Laterality: Left;    Allergies:  No Known Allergies  Family History:  Family History  Problem Relation Age of Onset   Cancer Mother    Alzheimer's disease Maternal Aunt    Anesthesia problems Neg Hx     Social History:  Social History   Tobacco Use   Smoking status: Former    Packs/day: 2.00    Types: Cigarettes   Smokeless tobacco: Never   Tobacco comments:    11/12/2017 "nothing since the 1980s"  Vaping Use   Vaping Use: Never used  Substance Use Topics   Alcohol use: Not Currently    Comment: 11/12/2017 "nothing since the 1980s"   Drug use: Never    Review of symptoms:  Constitutional:  Negative for unexplained weight loss, night sweats, fever, chills ENT:  Negative for nose bleeds, sinus pain, painful swallowing CV:  Negative for chest pain, shortness of breath, exercise intolerance, palpitations, loss of consciousness Resp:  Negative for cough,  wheezing, shortness of breath GI:  Negative for nausea, vomiting, diarrhea, bloody stools GU:  Positives noted in HPI; otherwise negative for gross hematuria, dysuria, urinary incontinence Neuro:  Negative for seizures, poor balance, limb weakness, slurred speech Psych:  Negative for lack of energy, depression, anxiety Endocrine:  Negative for polydipsia, polyuria, symptoms of hypoglycemia (dizziness, hunger, sweating) Hematologic:  Negative for anemia, purpura, petechia, prolonged or excessive bleeding, use of anticoagulants  Allergic:  Negative for difficulty breathing or choking as a result of exposure to anything; no shellfish allergy; no allergic response (rash/itch) to materials, foods  Physical  exam: BP 123/77   Pulse (!) 106   Ht 5\' 10"  (1.778 m)   Wt 227 lb (103 kg)   BMI 32.57 kg/m  GENERAL APPEARANCE:  Well appearing, well developed, well nourished, NAD HEENT: Atraumatic, Normocephalic, oropharynx clear. NECK: Supple without lymphadenopathy or thyromegaly. LUNGS: Clear to auscultation bilaterally. HEART: Regular Rate and Rhythm without murmurs, gallops, or rubs. ABDOMEN: Soft, non-tender, No Masses. EXTREMITIES: Moves all extremities well.  Without clubbing, cyanosis, or edema. NEUROLOGIC:  Alert and oriented x 3, normal gait, CN II-XII grossly intact.  MENTAL STATUS:  Appropriate. BACK:  Non-tender to palpation.  No CVAT SKIN:  Warm, dry and intact.   GU: Penis:  uncircumcised; erythema of glans Meatus: Normal Scrotum: normal, no masses Testis: normal without masses bilateral   Results: U/A dipstick: 2+ protein, trace LE  Results for orders placed or performed in visit on 07/30/22 (from the past 24 hour(s))  Bladder Scan (Post Void Residual) in office   Collection Time: 07/30/22  9:23 AM  Result Value Ref Range   Scan Result 79mL

## 2022-08-13 ENCOUNTER — Ambulatory Visit: Payer: Medicare HMO | Attending: Physician Assistant | Admitting: Nurse Practitioner

## 2022-08-13 NOTE — Progress Notes (Deleted)
Office Visit    Patient Name: [name] Date of Encounter: 08/13/2022  Primary Care Provider:  Roselee Nova, MD Primary Cardiologist:  Sanda Klein, MD  Chief Complaint    89 year old male with a history of CAD s/p STEMI, DES-pLCx, DESx2-OM2, DES x2-RCA in 2019, chronic combined systolic and diastolic heart failure, bifascicular block (RBBB, LAFB), syncope, hypertension, hyperlipidemia, type 2 diabetes, CKD stage III, and arthritis who presents for follow-up related to CAD and heart failure.  Past Medical History    Past Surgical History:  Procedure Laterality Date   BACK SURGERY     CATARACT EXTRACTION W/ INTRAOCULAR LENS  IMPLANT, BILATERAL Bilateral    COLONOSCOPY     CORONARY ANGIOGRAPHY N/A 11/12/2017   Procedure: CORONARY ANGIOGRAPHY;  Surgeon: Troy Sine, MD;  Location: [facility];  Service: Cardiovascular;  Laterality: N/A;   CORONARY STENT INTERVENTION N/A 11/12/2017   Procedure: CORONARY STENT INTERVENTION;  Surgeon: Troy Sine, MD;  Location: [facility] [place] [facility];  Service: Cardiovascular;  Laterality: N/A;   CORONARY/GRAFT ACUTE MI REVASCULARIZATION N/A 11/10/2017   Procedure: Coronary/Graft Acute MI Revascularization;  Surgeon: Troy Sine, MD;  Location: [facility] [facility];  Service: Cardiovascular;  Laterality: N/A;   EYE SURGERY     GLAUCOMA SURGERY Bilateral    INSERTION / PLACEMENT / REVISION NEUROSTIMULATOR  2000s   KNEE ARTHROSCOPY Right 1990s   LEFT HEART CATH AND CORONARY ANGIOGRAPHY N/A 11/10/2017   Procedure: LEFT HEART CATH AND CORONARY ANGIOGRAPHY;  Surgeon: Troy Sine, MD;  Location: [facility];  Service: Cardiovascular;  Laterality: N/A;   MINI SHUNT INSERTION  09/26/2011   Procedure: INSERTION OF MINI SHUNT;  Surgeon: Marylynn Pearson, MD;  Location: [place];  Service: Ophthalmology;  Laterality: Right;  Insertion of Ahmed shunt   POSTERIOR FUSION LUMBAR SPINE  1990s   REVERSE SHOULDER ARTHROPLASTY Right 09/21/2013    Procedure: RIGHT  SHOULDER ARTHROPLASTY REVERSE ;  Surgeon: Augustin Schooling, MD;  Location: [place];  Service: Orthopedics;  Laterality: Right;   SHOULDER INJECTION Left 09/21/2013   Procedure: SHOULDER INJECTION;  Surgeon: Augustin Schooling, MD;  Location: [place];  Service: Orthopedics;  Laterality: Left;    Allergies  No Known Allergies   Labs/Other Studies Reviewed    The following studies were reviewed today:  Left Heart Cath 11/10/17 Mid RCA lesion is 95% stenosed. Dist RCA lesion is 80% stenosed. Prox Cx to Mid Cx lesion is 90% stenosed. Ost 2nd Mrg to 2nd Mrg lesion is 100% stenosed. 2nd Mrg lesion is 95% stenosed. 1st Diag lesion is 95% stenosed. Mid LAD lesion is 80% stenosed. Dist LAD lesion is 90% stenosed. Post intervention, there is a 0% residual stenosis. Post intervention, there is a 0% residual stenosis. Post intervention, there is a 0% residual stenosis. A stent was successfully placed. A stent was successfully placed. A stent was successfully placed.   Acute inferolateral ST segment elevation myocardial infarction secondary to total occlusion of the circumflex vessel.   Multivessel CAD with 30% proximal and 95% distal diagonal 1 stenoses of the LAD, 80% mid distal and 90% distal LAD stenoses, and large dominant RCA with 95% mid stenosis and 80% distal stenosis prior to the PDA takeoff.     Successful complex percutaneous coronary intervention involving the proximal circumflex, proximal large OM vessel and distal inferior branch of this vessel after a large additional branch which cannot be visualized on the diagram above.  A 2.0  x 12 mm Resolute Onyx DES stent was placed in the distal inferior secondary branch of the marginal vessel and reduced to 0%; the site of 100% occlusion was treated with PTCA/DES stenting with a 2.5 x 18 mm Resolute DES stent positioned just proximal to the more distal bifurcation into a large branch and distal inferior branch with the 100%  occlusion being reduced to 0% with post dilatation up to 3.0 mm; and the proximal 90% stenosis on a sharp bend of the vessel being reduced to 0% with ultimate insertion of a 3.0 x 18 mm Resolute stent postdilated to 3.34 mm.   LVEDP  24 mm Hg.   RECOMMENDATION: DAPT therapy for minimum of 1 year, and probably indefinitely.  Once the patient blood pressure is stable we will initiate beta-blocker and ACE/arm therapy.  A 2D echo Doppler study will be done to assess LV function.  The patient will be hydrated post procedure.  On Tuesday, November 12, 2017 if renal function is stable, plan stageD PCI to the mid and distal RCA with relook at the left system.  Echocardiogram 11/11/17  - Left ventricle: The cavity size was normal. There was moderate    concentric hypertrophy. Systolic function was mildly to    moderately reduced. The estimated ejection fraction was in the    range of 40% to 45%. Doppler parameters are consistent with    abnormal left ventricular relaxation (grade 1 diastolic    dysfunction). There was no evidence of elevated ventricular    filling pressure by Doppler parameters.  - Aortic valve: Valve area (VTI): 2.08 cm^2. Valve area (Vmax):    2.13 cm^2. Valve area (Vmean): 1.98 cm^2.  - Aortic root: The aortic root was normal in size.  - Mitral valve: Valve area by pressure half-time: 1.28 cm^2.  - Left atrium: The atrium was normal in size.  - Right ventricle: The cavity size was normal. Wall thickness was    normal. Systolic function was normal.  - Right atrium: The atrium was normal in size.  - Tricuspid valve: There was no regurgitation.  - Pulmonary arteries: Systolic pressure could not be accurately    estimated.  - Inferior vena cava: The vessel was normal in size.  - Pericardium, extracardiac: There was no pericardial effusion.   LHC 11/12/17 1st Diag lesion is 95% stenosed. Mid LAD lesion is 70% stenosed. Dist LAD lesion is 85% stenosed. Previously placed 2nd Mrg stent  (unknown type) is widely patent. Previously placed Ost 2nd Mrg to 2nd Mrg stent (unknown type) is widely patent. Previously placed Prox Cx to Mid Cx stent (unknown type) is widely patent. Dist RCA lesion is 80% stenosed. Post intervention, there is a 0% residual stenosis. Prox RCA to Mid RCA lesion is 30% stenosed. Mid RCA lesion is 95% stenosed. A stent was successfully placed. Post intervention, there is a 0% residual stenosis. Post intervention, there is a 0% residual stenosis. A stent was successfully placed.   Re-look of the left coronary circulation revealed widely patent stents extending from the proximal circumflex into the marginal vessel and also into the distal branch without restenosis.  The LAD had previously demonstrated 95% distal stenosis in a small caliber diagonal vessel, and the mid distal to distal LAD lesions appeared slightly improved now 70 and 85%.   Successful PCI to the RCA with DES stenting of the distal 80% stenosis reduced to 0% with a 2.25 x 12 mm Resolute Onyx stent postdilated to 2.4 mm, and the mid  RCA stenosis of 95% with proximal eccentric narrowing stented with a 3.0 x 26 mm Resolute stent postdilated to 3.36 tapering to 3.25 with the entire stenosis being reduced to 0%.   RECOMMENDATION: DAPT for minimum of 1 year.  The patient will be started on carvedilol in addition to amlodipine in light of his hypertension as well as concomitant CAD.  He is on high potency statin therapy. Recent Labs: 06/27/2022: BUN 15; Creatinine, Ser 1.15; NT-Pro BNP 268; Potassium 4.1; Sodium 140  Recent Lipid Panel    Component Value Date/Time   CHOL 138 11/11/2017 0217   TRIG 72 11/11/2017 0217   HDL 36 (L) 11/11/2017 0217   CHOLHDL 3.8 11/11/2017 0217   VLDL 14 11/11/2017 0217   [place] 88 11/11/2017 0217    History of Present Illness    89 year old male with the above past medical history including CAD s/p STEMI, DES-pLCx, DESx2-OM2, DES x2-RCA in 2019, chronic  combined systolic and diastolic heart failure, bifascicular block (RBBB, LAFB), syncope, hypertension, hyperlipidemia, type 2 diabetes, CKD stage III, and arthritis.  He had a syncopal episode in 2016.  Echocardiogram at the time showed EF 55 to 60%, no RWMA, G1 DD.  Cardiac monitor showed NSR with nonspecific IVCD, 2 episodes of accelerated IVR (longest lasting 19 consecutive beats), no significant bradycardia arrhythmias.  He was hospitalized in June 2019 in the setting of acute inferolateral STEMI secondary to total occlusion of circumflex vessel s/p PCI/DES x 3 to the proximal left circumflex, proximal OM1, and distal OM1.  He was also noted to have multivessel CAD with 30% proximal and 95% distal diagonal 1 stenoses of the LAD, 80% mid distal and 90% distal LAD stenoses, large dominant RCA with 95% mid stenosis, 80% distal stenosis prior to PDA takeoff.  He returned to the Cath Lab for staged PCI and underwent DES x 2-RCA.  Echocardiogram at the time showed EF 40 to 45%, G1 DD.  He was last seen in the office on 06/27/2022 noted shortness of breath at rest, orthopnea, intermittent dependent ankle edema. BNP was normal. Lasix was increased to 40 mg daily x 3 days followed by 20 mg daily.  He was noted to have a history of frequent UTIs, therefore, not a candidate for SGLT2 inhibitor (he was referred to urology per son's request).  He denied chest pain, though also noted not to be a candidate for cardiac catheterization.  Should symptoms persist despite escalation of diuretic therapy, consideration of repeat echocardiogram was recommended to guide GDMT.  He presents today for follow-up. Since his last visit  Chronic combined systolic and diastolic heart failure: CAD: Hypertension: Hyperlipidemia: Type 2 diabetes: CKD stage III: Disposition:  Home Medications    Current Outpatient Medications  Medication Sig Dispense Refill   acetaminophen (TYLENOL) 325 MG tablet Take 2 tablets (650 mg total) by  mouth every 6 (six) hours.     amLODipine (NORVASC) 5 MG tablet Take 1 tablet (5 mg total) by mouth daily. 90 tablet 1   aspirin EC 81 MG EC tablet Take 1 tablet (81 mg total) by mouth daily.     atorvastatin (LIPITOR) 80 MG tablet Take 1 tablet (80 mg total) by mouth daily. 90 tablet 2   BELSOMRA 10 MG TABS Take 1 tablet by mouth at bedtime as needed.     cefdinir (OMNICEF) 300 MG capsule Take 300 mg by mouth every 12 (twelve) hours.     furosemide (LASIX) 20 MG tablet Take 20 mg by mouth daily.  insulin aspart (NOVOLOG) 100 UNIT/ML injection Inject 0-9 Units into the skin 3 (three) times daily with meals. 10 mL 11   Menthol-Camphor 16-11 % CREA Apply 1 application topically as needed (knee pain).     metoprolol succinate (TOPROL-XL) 25 MG 24 hr tablet TAKE 1 TABLET BY MOUTH EVERY DAY 90 tablet 3   mirabegron ER (MYRBETRIQ) 25 MG TB24 tablet Take 1 tablet (25 mg total) by mouth daily. 28 tablet 0   nitroGLYCERIN (NITROSTAT) 0.4 MG SL tablet Dissolve 1 tab under tongue as needed for chest pain. May repeat every 5 minutes x 2 more doses. If no relief call 9-1-1. 25 tablet 2   prednisoLONE acetate (PRED FORTE) 1 % ophthalmic suspension Place 1 drop into the right eye at bedtime. 5 mL 0   PSYLLIUM PO Take 1 packet by mouth daily.     sertraline (ZOLOFT) 50 MG tablet Take 1 tablet (50 mg total) by mouth at bedtime. 30 tablet 0   timolol (TIMOPTIC) 0.5 % ophthalmic solution Place 1 drop into both eyes daily.     TRAVATAN Z 0.004 % SOLN ophthalmic solution Place 1 drop into the left eye at bedtime. 2.5 mL 0   No current facility-administered medications for this visit.     Review of Systems    ***.  All other systems reviewed and are otherwise negative except as noted above.    Physical Exam    VS:  There were no vitals taken for this visit. , BMI There is no height or weight on file to calculate BMI.     GEN: Well nourished, well developed, in no acute distress. HEENT: normal. Neck:  Supple, no JVD, carotid bruits, or masses. Cardiac: RRR, no murmurs, rubs, or gallops. No clubbing, cyanosis, edema.  Radials/DP/PT 2+ and equal bilaterally.  Respiratory:  Respirations regular and unlabored, clear to auscultation bilaterally. GI: Soft, nontender, nondistended, BS + x 4. MS: no deformity or atrophy. Skin: warm and dry, no rash. Neuro:  Strength and sensation are intact. Psych: Normal affect.  Accessory Clinical Findings    ECG personally reviewed by me today - *** - no acute changes.   Lab Results  Component Value Date   WBC 6.4 05/30/2018   HGB 12.0 (L) 05/30/2018   HCT 38.2 (L) 05/30/2018   MCV 89.9 05/30/2018   PLT 162 05/30/2018   Lab Results  Component Value Date   CREATININE 1.15 06/27/2022   BUN 15 06/27/2022   NA 140 06/27/2022   K 4.1 06/27/2022   CL 102 06/27/2022   CO2 27 06/27/2022   Lab Results  Component Value Date   ALT 18 05/29/2018   AST 17 05/29/2018   ALKPHOS 85 05/29/2018   BILITOT 0.8 05/29/2018   Lab Results  Component Value Date   CHOL 138 11/11/2017   HDL 36 (L) 11/11/2017   LDLCALC 88 11/11/2017   TRIG 72 11/11/2017   CHOLHDL 3.8 11/11/2017    Lab Results  Component Value Date   HGBA1C 6.1 (H) 05/29/2018    Assessment & Plan    1.  ***  No BP recorded.  {Refresh Note OR Click here to enter BP  :1}***   Lenna Sciara, NP 08/13/2022, 4:16 AM

## 2022-08-14 ENCOUNTER — Other Ambulatory Visit: Payer: Self-pay | Admitting: Physician Assistant

## 2022-08-14 ENCOUNTER — Other Ambulatory Visit: Payer: Self-pay | Admitting: Family Medicine

## 2022-08-14 ENCOUNTER — Encounter: Payer: Self-pay | Admitting: Pathology

## 2022-08-14 DIAGNOSIS — R3915 Urgency of urination: Secondary | ICD-10-CM

## 2022-08-28 ENCOUNTER — Ambulatory Visit: Payer: Medicare HMO | Admitting: Urology

## 2022-08-28 ENCOUNTER — Encounter: Payer: Self-pay | Admitting: Urology

## 2022-08-28 VITALS — BP 125/76 | HR 80 | Ht 70.0 in | Wt 227.0 lb

## 2022-08-28 DIAGNOSIS — N3942 Incontinence without sensory awareness: Secondary | ICD-10-CM | POA: Diagnosis not present

## 2022-08-28 DIAGNOSIS — Z87440 Personal history of urinary (tract) infections: Secondary | ICD-10-CM | POA: Diagnosis not present

## 2022-08-28 LAB — MICROSCOPIC EXAMINATION
Cast Type: NONE SEEN
Casts: NONE SEEN /lpf
Crystal Type: NONE SEEN
Crystals: NONE SEEN
Mucus, UA: NONE SEEN
RBC, Urine: NONE SEEN /hpf (ref 0–2)
Trichomonas, UA: NONE SEEN
Yeast, UA: NONE SEEN

## 2022-08-28 LAB — URINALYSIS, ROUTINE W REFLEX MICROSCOPIC
Bilirubin, UA: NEGATIVE
Glucose, UA: NEGATIVE
Ketones, UA: NEGATIVE
Nitrite, UA: NEGATIVE
Protein,UA: NEGATIVE
RBC, UA: NEGATIVE
Specific Gravity, UA: 1.015 (ref 1.005–1.030)
Urobilinogen, Ur: 0.2 mg/dL (ref 0.2–1.0)
pH, UA: 6.5 (ref 5.0–7.5)

## 2022-08-28 MED ORDER — MIRABEGRON ER 25 MG PO TB24: 25.0000 mg | ORAL_TABLET | Freq: Every day | ORAL | 11 refills | Status: DC

## 2022-08-28 NOTE — Progress Notes (Signed)
Assessment: 1. History of UTI   2. Urinary incontinence without sensory awareness     Plan: Continue Myrbetriq 25 mg daily.  Samples and rx given. No evidence of UTI today Recommend obtaining urine culture for any UTI symptoms prior to therapy Return to office in 3 months  Chief Complaint:  Chief Complaint  Patient presents with   Recurrent UTI   Urinary Incontinence    History of Present Illness:  [name] is a 89 y.o. male who is seen for further evaluation of UTI's and LUTS.  At his initial visit in 2/24, he reported a history of frequent UTIs, occurring 6 times per year.  His last infection was approximately 3 weeks prior to his visit.  He typically has hallucinations associated with the UTIs.  No fevers, chills, flank pain, gross hematuria, or dysuria.  These have apparently all been treated by his PCP.   Urine culture result from 11/22: >100 K Klebsiella. Urine culture from 11/23:  10-49K Morganella.  Treated with Cefdinir.  He has baseline symptoms of urinary frequency, voiding every hour, nocturia 3-4 times, urge incontinence and unaware incontinence.  He wears a diaper day and night.  He changes the diaper 4 times per day.  No dysuria or gross hematuria.  No history of kidney stones.  No recent imaging. IPSS = 9.  PVR = 50 ml.  He was given a trial of Myrbetriq 25 mg daily at his visit in 2/24.  He returns today for follow-up.  He noted significant improvement in his urinary symptoms with the Myrbetriq.  He had decreased frequency, nocturia, and incontinence.  No side effects from the medication. Symptoms have gradually returned since running out of the samples.  No dysuria or gross hematuria. IPSS = 8 today.  Portions of the above documentation were copied from a prior visit for review purposes only.   Past Medical History:  Past Medical History:  Diagnosis Date   Arthritis    "all over" (11/12/2017)   CAD (coronary artery disease) 11/11/2017   S/p IL  STEMI 6/19:  S/p DES to pLCx and DES x 2 to OM2 // staged PCI 6/19:  DES x 2 to RCA // residual CAD at cath 6/19:  mLAD 80, dLAD 90, D1 95 tx medically // EF 40-45 by echo   Chronic combined systolic and diastolic CHF (congestive heart failure) ([place])    ischemic CM // s/p MI in 11/2017 >> Echo 6/19:  Moderate concentric LVH, EF [id number], grade 1 diastolic dysfunction, inferior, inferolateral, apical inferior akinesis    Chronic lower back pain    CKD (chronic kidney disease), stage III (HCC)    Constipation    takes an OTC med as needed   Diabetic nephropathy (HCC)    Gastric ulcer 1970s   Glaucoma, both eyes    uses eye drops daily   Gout    takes Allopurinol daily   History of colon polyps    History of ST elevation myocardial infarction (STEMI) 11/10/2017   PCI to LCx and staged PCI to RCA   History of stress test    a. 09/2014 MV: EF 53%, no ischemia/infarct.   Hyperlipidemia    takes Pravastatin daily   Hypertension    takes Amlodipine and Losartan daily   Insomnia    uses OTC sleep aide   Joint pain    Joint swelling    right knee    Legally blind    Macular degeneration    Morbid  obesity ([place])    Nocturia    Peripheral edema    Pleurisy 20+yrs ago   Pneumonia    "I think once when I was a kid" (11/12/2017)   Sleep apnea    Stroke ([place])    "I've had them; don't know anything about them"; denies residual on 11/12/2017)   Syncope    a. 09/2014 in setting of orthostasis; b. 10/2014 Event monitor: 19 beat run of asymp NSVT; c. 01/2015 Echo: EF 60-65%, no rwma.   Type II diabetes mellitus (HCC)    takes Metformin daily   Urinary frequency    takes Flomax daily    Past Surgical History:  Past Surgical History:  Procedure Laterality Date   BACK SURGERY     CATARACT EXTRACTION W/ INTRAOCULAR LENS  IMPLANT, BILATERAL Bilateral    COLONOSCOPY     CORONARY ANGIOGRAPHY N/A 11/12/2017   Procedure: CORONARY ANGIOGRAPHY;  Surgeon: Troy Sine, MD;  Location: [facility];   Service: Cardiovascular;  Laterality: N/A;   CORONARY STENT INTERVENTION N/A 11/12/2017   Procedure: CORONARY STENT INTERVENTION;  Surgeon: Troy Sine, MD;  Location: [place];  Service: Cardiovascular;  Laterality: N/A;   CORONARY/GRAFT ACUTE MI REVASCULARIZATION N/A 11/10/2017   Procedure: Coronary/Graft Acute MI Revascularization;  Surgeon: Troy Sine, MD;  Location: [facility];  Service: Cardiovascular;  Laterality: N/A;   EYE SURGERY     GLAUCOMA SURGERY Bilateral    INSERTION / PLACEMENT / REVISION NEUROSTIMULATOR  2000s   KNEE ARTHROSCOPY Right 1990s   LEFT HEART CATH AND CORONARY ANGIOGRAPHY N/A 11/10/2017   Procedure: LEFT HEART CATH AND CORONARY ANGIOGRAPHY;  Surgeon: Troy Sine, MD;  Location: [facility];  Service: Cardiovascular;  Laterality: N/A;   MINI SHUNT INSERTION  09/26/2011   Procedure: INSERTION OF MINI SHUNT;  Surgeon: Marylynn Pearson, MD;  Location: [facility];  Service: Ophthalmology;  Laterality: Right;  Insertion of Ahmed shunt   POSTERIOR FUSION LUMBAR SPINE  1990s   REVERSE SHOULDER ARTHROPLASTY Right 09/21/2013   Procedure: RIGHT  SHOULDER ARTHROPLASTY REVERSE ;  Surgeon: Augustin Schooling, MD;  Location: [place];  Service: Orthopedics;  Laterality: Right;   SHOULDER INJECTION Left 09/21/2013   Procedure: SHOULDER INJECTION;  Surgeon: Augustin Schooling, MD;  Location: [place];  Service: Orthopedics;  Laterality: Left;    Allergies:  No Known Allergies  Family History:  Family History  Problem Relation Age of Onset   Cancer Mother    Alzheimer's disease Maternal Aunt    Anesthesia problems Neg Hx     Social History:  Social History   Tobacco Use   Smoking status: Former    Packs/day: 2    Types: Cigarettes   Smokeless tobacco: Never   Tobacco comments:    11/12/2017 "nothing since the 1980s"  Vaping Use   Vaping Use: Never used  Substance Use Topics   Alcohol use: Not Currently    Comment: 11/12/2017 "nothing since the 1980s"    Drug use: Never    ROS: Constitutional:  Negative for fever, chills, weight loss CV: Negative for chest pain, previous MI, hypertension Respiratory:  Negative for shortness of breath, wheezing, sleep apnea, frequent cough GI:  Negative for nausea, vomiting, bloody stool, GERD  Physical exam: BP 125/76   Pulse 80   Ht 5\' 10"  (1.778 m)   Wt 227 lb (103 kg)   BMI 32.57 kg/m  GENERAL APPEARANCE:  Well appearing, well developed, well  nourished, NAD HEENT:  Atraumatic, normocephalic, oropharynx clear NECK:  Supple without lymphadenopathy or thyromegaly ABDOMEN:  Soft, non-tender, no masses EXTREMITIES:  Moves all extremities well, without clubbing, cyanosis, or edema NEUROLOGIC:  Alert and oriented x 3, in wheelchair, CN II-XII grossly intact MENTAL STATUS:  appropriate BACK:  Non-tender to palpation, No CVAT SKIN:  Warm, dry, and intact   Results: U/A: 0-5 WBC, few bacteria

## 2022-08-29 ENCOUNTER — Ambulatory Visit: Payer: Medicare HMO | Admitting: Podiatry

## 2022-09-05 ENCOUNTER — Ambulatory Visit
Admission: RE | Admit: 2022-09-05 | Discharge: 2022-09-05 | Disposition: A | Discharge status: Home / Self Care | Payer: Medicare HMO | Source: Ambulatory Visit | Attending: Family Medicine | Admitting: Family Medicine

## 2022-09-05 DIAGNOSIS — R3915 Urgency of urination: Secondary | ICD-10-CM

## 2022-10-25 ENCOUNTER — Telehealth: Payer: Self-pay

## 2022-10-25 NOTE — Telephone Encounter (Signed)
(  3:05 pm) PC SW scheduled an initial [facility] visit for 10/30/22 @ 12pm.

## 2022-10-30 ENCOUNTER — Other Ambulatory Visit: Payer: Self-pay

## 2022-10-30 DIAGNOSIS — Z515 Encounter for palliative care: Secondary | ICD-10-CM

## 2022-10-30 NOTE — Progress Notes (Signed)
COMMUNITY PALLIATIVE CARE SW NOTE  PATIENT NAME: [name] DOB: [birth year] MRN: [id number]  PRIMARY CARE PROVIDER: Ellyn Hack, MD  RESPONSIBLE PARTY:  Acct ID - Guarantor Home Phone Work Phone Relationship Acct Type  [id number] [name], [name]* [id number]  Self P/F     [address], [place], [place] [address]   Initial [facility] Visit /Clinical Social Work  History of Current Illnesses: Hx of UTI, urinary incontinence, Chronic combined Systolic & diastolic CHF, SOB  Person (s) encountered: Patient, patient's daughter/PCG-Jorenda, LCSW-M.Tatem Holsonback  Purpose of the Visit: Initial F2F visit  Assessment: LCSW completed a review and assessment of patient's family/social, mental health and medical history, allergies, medications, and health (functional) status, including patient self-reporting and a review of relevant consultative reports was completed today as part of a comprehensive evaluation by [facility] Clinical Social Work services.  Summary of Encounter: Initial face-to-face visit. Patient and PCG was educated on the program, to include role in his care, visit frequency and they were provided contact information. Consent to services were signed by patient's daughter. Patient is legally blind. He report that he has auditory and visual hallucinations when he is experiencing UTI's. Patient report pain to his right shoulder and knee, he does not take any pain medication. He report having neuropathy in his hands and feet that feel that pins.He also feels some numbness. He was taking Gabapentin, but is no longer taking that medication The fatty part of his hands, near his pinky finger swells and cause intermittent pain. He has a standing walker, that he report he has no problem getting up to.  He has difficulty with balance. He has had several falls, two in the last six months. He would like to receive physical therapy requested. His appetite is  good and he is eating 3 meals a day. He receives mobile meals. His friend-Mike takes him to medical appointments and stays with him at night. He reports not sleeping well at night stating "my mind is running 5 miles per hour". He states he is often awake all night. He wears depends as he is incontinent. He requested assistance with obtaining pull ups and also pads inserts. He report no swallowing issues.   Patient was born in [place], [place]. Patient lived in [place], [place] and [place]. Patient graduated high school. He also took home courses for electronics. No [employer]. 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 He is a widowed twice. He has 6 children. He is of [employer] faith [facility]). He is a DNR. His daughter has POA/HCPOA. Patient has [employer] and [employer].   Social Detriments of Health Nutrition: Patient is eating three meals per day an is receiving mobile meals; No food insecurities. Transportation: Patient has a friend that provides transportation to appointments. Housing: Patient lives in a small apartment. No housing issues Financial Support:No financial concerns noted Social Isolation: Patient has supportive friends and family. Cultural Competence: Patient is of [employer] faith  Patient Goals: He desire to walk again independent of the walker. He would like the neuropathy to stop causing pain or discomfort.  Social Work Interventions Provided: Initial visit with patient to assess his needs and comfort, supportive presence, obtained information for psychosocial assessment; education regarding services, obtained consent to service; provided 2 signed DNR as patient requested  Collaboration/Coordination of Care: SW to obtained donated incontinent supplies for patient  Plan/Follow-up: SW to provide supportive visits with patient to provide education, resources and  interventions that align with patient's goals of care.    Social History   Tobacco Use   Smoking status: Former    Packs/day: 2    Types: Cigarettes   Smokeless tobacco: Never   Tobacco comments:    11/12/2017 "nothing since the 1980s"  Substance Use Topics   Alcohol use: Not Currently    Comment: 11/12/2017 "nothing since the 1980s"   CODE STATUS: DNR ADVANCED DIRECTIVES:Yes MOST FORM COMPLETE: No HOSPICE EDUCATION PROVIDED: No  PPS: 50% Risk Score:Low Pain Score: 4 Next Appointment: 11/13/22 @ 11:30 am  [employer], LCSW

## 2022-11-13 ENCOUNTER — Other Ambulatory Visit: Payer: Self-pay

## 2022-11-13 DIAGNOSIS — Z515 Encounter for palliative care: Secondary | ICD-10-CM

## 2022-11-14 NOTE — Progress Notes (Signed)
COMMUNITY PALLIATIVE CARE SW NOTE  PATIENT NAME: [name] DOB: 1930/06/18 MRN: [id number]  PRIMARY CARE PROVIDER: Ellyn Hack, MD  RESPONSIBLE PARTY:  Acct ID - Guarantor Home Phone Work Phone Relationship Acct Type  [id number] DAYSON, ABOUD* [phone]  Self P/F     [address], [place], [place] [address]   [facility] Encounter-Clinical Social Work   History of Current Illnesses: Hx of UTI, urinary incontinence, Chronic combined Systolic & diastolic CHF, SOB   Person (s) encountered: Patient's daughter/PCG-Jorenda   Purpose of the Visit: Telephonic encounter   Assessment: LCSW completed a review and assessment of patient's family/social, mental health and medical history, allergies, medications, and health (functional) status, including patient self-reporting and a review of relevant consultative reports was completed today as part of a comprehensive evaluation by [facility] Clinical Social Work services.   Summary of Encounter: Patient's daughter report she feels patient  may have a UTI as he has been having auditory and visual hallucinations for over three days. She report that she has reached out to his doctor to request a urinalysis to further assess and/or call in an antibiotic. SW endorsed this as [facility] did not have to ability to do urinalysis.  Patient continues to have right shoulder and knee pain intermittently and he tolerates this without pain medication. He continues to have neuropathy and numbness in his hands and feet.  He continues to have intermittent pain in his hands. His ambulatory status remains unchanged as he has a standing walker to ambulate, but has difficulty with balance. He remains a high fall risk and has had several falls in the past. His appetite remains good and he is eating  at least 3 meals a day to include mobile meals. Patient continues to have difficulty sleeping at night.    Social Detriments of  Health Nutrition: Patient is eating three meals per day an is receiving mobile meals; No food insecurities. Transportation: Patient has a friend that provides transportation to appointments. Housing: Patient lives in a small apartment. No housing issues Financial Support:No financial concerns noted Social Isolation: Patient has supportive friends and family. Cultural Competence: Patient is of [employer] faith   Patient Goals: He desire to walk again independent of the walker. He would like the neuropathy to stop causing pain and  discomfort.   Social Work Interventions Provided: Assessment of  his needs and comfort, supportive presence and counseling.   Collaboration/Coordination of Care: SW to donate incontinent supplies when available.   Plan/Follow-up: SW to provide supportive visits with patient to provide education, resources and interventions that align with patient's goals of care.  Social History   Tobacco Use   Smoking status: Former    Packs/day: 2    Types: Cigarettes   Smokeless tobacco: Never   Tobacco comments:    11/12/2017 "nothing since the 1980s"  Substance Use Topics   Alcohol use: Not Currently    Comment: 11/12/2017 "nothing since the 1980s"    CODE STATUS: DNR ADVANCED DIRECTIVES: Yes MOST FORM COMPLETE: No HOSPICE EDUCATION PROVIDED: No  PPS: 50% Risk Score: Medium Pain Score: 0 Next appointment: 11/28/22 @11 :30 am  [employer], [place]

## 2022-11-23 ENCOUNTER — Ambulatory Visit: Payer: Medicare HMO | Admitting: Urology

## 2022-11-23 NOTE — Progress Notes (Deleted)
Assessment: 1. History of UTI   2. Urinary incontinence without sensory awareness     Plan: Continue Myrbetriq 25 mg daily.  Samples and rx given. No evidence of UTI today Recommend obtaining urine culture for any UTI symptoms prior to therapy Return to office in 3 months  Chief Complaint:  No chief complaint on file.   History of Present Illness:  ADAN BAEHR is a 89 y.o. male who is seen for further evaluation of UTI's and LUTS.  At his initial visit in 2/24, he reported a history of frequent UTIs, occurring 6 times per year.  His last infection was approximately 3 weeks prior to his visit.  He typically has hallucinations associated with the UTIs.  No fevers, chills, flank pain, gross hematuria, or dysuria.  These have apparently all been treated by his PCP.   Urine culture result from 11/22: >100 K Klebsiella. Urine culture from 11/23:  10-49K Morganella.  Treated with Cefdinir.  He has baseline symptoms of urinary frequency, voiding every hour, nocturia 3-4 times, urge incontinence and unaware incontinence.  He wears a diaper day and night.  He changes the diaper 4 times per day.  No dysuria or gross hematuria.  No history of kidney stones.  No recent imaging. IPSS = 9.  PVR = 50 ml.  He was given a trial of Myrbetriq 25 mg daily at his visit in 2/24.  At his visit in March 2024, he noted significant improvement in his urinary symptoms with the Myrbetriq.  He had decreased frequency, nocturia, and incontinence.  No side effects from the medication. His symptoms  gradually returned since running out of the samples.  No dysuria or gross hematuria. IPSS = 8. He continued on Myrbetriq 25 mg daily.  Portions of the above documentation were copied from a prior visit for review purposes only.   Past Medical History:  Past Medical History:  Diagnosis Date   Arthritis    "all over" (11/12/2017)   CAD (coronary artery disease) 11/11/2017   S/p IL STEMI 6/19:  S/p DES to  pLCx and DES x 2 to OM2 // staged PCI 6/19:  DES x 2 to RCA // residual CAD at cath 6/19:  mLAD 80, dLAD 90, D1 95 tx medically // EF 40-45 by echo   Chronic combined systolic and diastolic CHF (congestive heart failure) (HCC)    ischemic CM // s/p MI in 11/2017 >> Echo 6/19:  Moderate concentric LVH, EF 40-45, grade 1 diastolic dysfunction, inferior, inferolateral, apical inferior akinesis    Chronic lower back pain    CKD (chronic kidney disease), stage III (HCC)    Constipation    takes an OTC med as needed   Diabetic nephropathy (HCC)    Gastric ulcer 1970s   Glaucoma, both eyes    uses eye drops daily   Gout    takes Allopurinol daily   History of colon polyps    History of ST elevation myocardial infarction (STEMI) 11/10/2017   PCI to LCx and staged PCI to RCA   History of stress test    a. 09/2014 MV: EF 53%, no ischemia/infarct.   Hyperlipidemia    takes Pravastatin daily   Hypertension    takes Amlodipine and Losartan daily   Insomnia    uses OTC sleep aide   Joint pain    Joint swelling    right knee    Legally blind    Macular degeneration    Morbid obesity (HCC)  Nocturia    Peripheral edema    Pleurisy 20+yrs ago   Pneumonia    "I think once when I was a kid" (11/12/2017)   Sleep apnea    Stroke (HCC)    "I've had them; don't know anything about them"; denies residual on 11/12/2017)   Syncope    a. 09/2014 in setting of orthostasis; b. 10/2014 Event monitor: 19 beat run of asymp NSVT; c. 01/2015 Echo: EF 60-65%, no rwma.   Type II diabetes mellitus (HCC)    takes Metformin daily   Urinary frequency    takes Flomax daily    Past Surgical History:  Past Surgical History:  Procedure Laterality Date   BACK SURGERY     CATARACT EXTRACTION W/ INTRAOCULAR LENS  IMPLANT, BILATERAL Bilateral    COLONOSCOPY     CORONARY ANGIOGRAPHY N/A 11/12/2017   Procedure: CORONARY ANGIOGRAPHY;  Surgeon: Lennette Bihari, MD;  Location: [facility];  Service: Cardiovascular;   Laterality: N/A;   CORONARY STENT INTERVENTION N/A 11/12/2017   Procedure: CORONARY STENT INTERVENTION;  Surgeon: Lennette Bihari, MD;  Location: [facility];  Service: Cardiovascular;  Laterality: N/A;   CORONARY/GRAFT ACUTE MI REVASCULARIZATION N/A 11/10/2017   Procedure: Coronary/Graft Acute MI Revascularization;  Surgeon: Lennette Bihari, MD;  Location: [facility];  Service: Cardiovascular;  Laterality: N/A;   EYE SURGERY     GLAUCOMA SURGERY Bilateral    INSERTION / PLACEMENT / REVISION NEUROSTIMULATOR  2000s   KNEE ARTHROSCOPY Right 1990s   LEFT HEART CATH AND CORONARY ANGIOGRAPHY N/A 11/10/2017   Procedure: LEFT HEART CATH AND CORONARY ANGIOGRAPHY;  Surgeon: Lennette Bihari, MD;  Location: [facility];  Service: Cardiovascular;  Laterality: N/A;   MINI SHUNT INSERTION  09/26/2011   Procedure: INSERTION OF MINI SHUNT;  Surgeon: Chalmers Guest, MD;  Location: [facility] OR;  Service: Ophthalmology;  Laterality: Right;  Insertion of Ahmed shunt   POSTERIOR FUSION LUMBAR SPINE  1990s   REVERSE SHOULDER ARTHROPLASTY Right 09/21/2013   Procedure: RIGHT  SHOULDER ARTHROPLASTY REVERSE ;  Surgeon: [name], MD;  Location[facility] OR;  Service: Orthopedics;  Laterality: Right;   SHOULDER INJECTION Left 09/21/2013   Procedure: SHOULDER INJECTION;  Surgeon: [name], MD;  Location: [facility] OR;  Service: Orthopedics;  Laterality: Left;    Allergies:  No Known Allergies  Family History:  Family History  Problem Relation Age of Onset   Cancer Mother    Alzheimer's disease Maternal Aunt    Anesthesia problems Neg Hx     Social History:  Social History   Tobacco Use   Smoking status: Former    Packs/day: 2    Types: Cigarettes   Smokeless tobacco: Never   Tobacco comments:    11/12/2017 "nothing since the 1980s"  Vaping Use   Vaping Use: Never used  Substance Use Topics   Alcohol use: Not Currently    Comment: 11/12/2017 "nothing since the 1980s"   Drug use: Never     ROS: Constitutional:  Negative for fever, chills, weight loss CV: Negative for chest pain, previous MI, hypertension Respiratory:  Negative for shortness of breath, wheezing, sleep apnea, frequent cough GI:  Negative for nausea, vomiting, bloody stool, GERD  Physical exam: There were no vitals taken for this visit. GENERAL APPEARANCE:  Well appearing, well developed, well nourished, NAD HEENT:  Atraumatic, normocephalic, oropharynx clear NECK:  Supple without lymphadenopathy or thyromegaly ABDOMEN:  Soft, non-tender, no masses EXTREMITIES:  Moves all extremities well, without clubbing, cyanosis, or edema NEUROLOGIC:  Alert and oriented x 3, normal gait, CN II-XII grossly intact MENTAL STATUS:  appropriate BACK:  Non-tender to palpation, No CVAT SKIN:  Warm, dry, and intact   Results: U/A:

## 2023-01-04 ENCOUNTER — Encounter (HOSPITAL_COMMUNITY): Payer: Self-pay

## 2023-01-04 ENCOUNTER — Emergency Department (HOSPITAL_COMMUNITY)
Admission: EM | Admit: 2023-01-04 | Discharge: 2023-01-04 | Disposition: A | Discharge status: Home / Self Care | Payer: Medicare HMO | Attending: Emergency Medicine | Admitting: Emergency Medicine

## 2023-01-04 ENCOUNTER — Emergency Department (HOSPITAL_COMMUNITY): Payer: Medicare HMO

## 2023-01-04 ENCOUNTER — Other Ambulatory Visit: Payer: Self-pay

## 2023-01-04 DIAGNOSIS — I509 Heart failure, unspecified: Secondary | ICD-10-CM | POA: Diagnosis not present

## 2023-01-04 DIAGNOSIS — I110 Hypertensive heart disease with heart failure: Secondary | ICD-10-CM | POA: Diagnosis not present

## 2023-01-04 DIAGNOSIS — J810 Acute pulmonary edema: Secondary | ICD-10-CM | POA: Diagnosis not present

## 2023-01-04 DIAGNOSIS — I440 Atrioventricular block, first degree: Secondary | ICD-10-CM | POA: Diagnosis not present

## 2023-01-04 DIAGNOSIS — I2510 Atherosclerotic heart disease of native coronary artery without angina pectoris: Secondary | ICD-10-CM | POA: Diagnosis not present

## 2023-01-04 DIAGNOSIS — R0602 Shortness of breath: Secondary | ICD-10-CM | POA: Diagnosis present

## 2023-01-04 DIAGNOSIS — E119 Type 2 diabetes mellitus without complications: Secondary | ICD-10-CM | POA: Diagnosis not present

## 2023-01-04 DIAGNOSIS — Z7982 Long term (current) use of aspirin: Secondary | ICD-10-CM | POA: Diagnosis not present

## 2023-01-04 DIAGNOSIS — Z794 Long term (current) use of insulin: Secondary | ICD-10-CM | POA: Diagnosis not present

## 2023-01-04 DIAGNOSIS — Z79899 Other long term (current) drug therapy: Secondary | ICD-10-CM | POA: Diagnosis not present

## 2023-01-04 LAB — BASIC METABOLIC PANEL
Anion gap: 8 (ref 5–15)
BUN: 16 mg/dL (ref 8–23)
CO2: 27 mmol/L (ref 22–32)
Calcium: 9.2 mg/dL (ref 8.9–10.3)
Chloride: 104 mmol/L (ref 98–111)
Creatinine, Ser: 1.24 mg/dL (ref 0.61–1.24)
GFR, Estimated: 56 mL/min — ABNORMAL LOW (ref >60)
Glucose, Bld: 130 mg/dL — ABNORMAL HIGH (ref 70–99)
Potassium: 3.6 mmol/L (ref 3.5–5.1)
Sodium: 139 mmol/L (ref 135–145)

## 2023-01-04 LAB — CBC
HCT: 41.9 % (ref 39.0–52.0)
Hemoglobin: 13.5 g/dL (ref 13.0–17.0)
MCH: 28.5 pg (ref 26.0–34.0)
MCHC: 32.2 g/dL (ref 30.0–36.0)
MCV: 88.6 fL (ref 80.0–100.0)
Platelets: 177 10*3/uL (ref 150–400)
RBC: 4.73 MIL/uL (ref 4.22–5.81)
RDW: 13.7 % (ref 11.5–15.5)
WBC: 5.2 10*3/uL (ref 4.0–10.5)
nRBC: 0 % (ref 0.0–0.2)

## 2023-01-04 LAB — TROPONIN I (HIGH SENSITIVITY)
Troponin I (High Sensitivity): 9 ng/L (ref <18)
Troponin I (High Sensitivity): 10 ng/L (ref <18)

## 2023-01-04 LAB — BRAIN NATRIURETIC PEPTIDE: B Natriuretic Peptide: 20.8 pg/mL (ref 0.0–100.0)

## 2023-01-04 MED ORDER — FUROSEMIDE 10 MG/ML IJ SOLN
40.0000 mg | Freq: Once | INTRAMUSCULAR | Status: AC
  Administered 2023-01-04: 40 mg via INTRAVENOUS
  Filled 2023-01-04: qty 4

## 2023-01-04 NOTE — Discharge Instructions (Signed)
You were seen in the emergency department for your shortness of breath.  Your x-ray showed that you had a little bit of extra fluid on your lungs but you had normal oxygen levels here and no signs of stress on your heart.  We gave you a dose of Lasix through the IV and you should continue to take your home Lasix as prescribed.  You can follow-up with your primary doctor and your cardiologist to have your symptoms rechecked.  You should return to the emergency department for significantly worsening shortness of breath, severe chest pain or any other new or concerning symptoms.

## 2023-01-04 NOTE — ED Provider Notes (Signed)
[facility] EMERGENCY DEPARTMENT AT [facility] Provider Note   CSN: [id number] Arrival date & time: 01/04/23  7829     History  Chief Complaint  Patient presents with   Abnormal ECG    [name] is a 89 y.o. male.  Patient is an 89 year old male with a PMH of CHF, DM, HTN, CAD who presented to the emergency department from his primary care doctor's office with an abnormal EKG. He states he went to his appointment with his caretaker Auburn Bilberry who I spoke to and reports they went to PCP for regular follow up appointment. Had EKG and was told it was abnormal and recommended to come to the emergency department. Patient reports some shortness of breath that is chronic for him. Denies chest pain, LE swelling, fever or cough. States he has been feeling in his normal state of health.   Dr. Sherryll Burger - [phone]. I spoke with the staff at [facility] where the patient saw Dr. Sherryll Burger. Was complaining of increased shortness of breath. Unable to visualize p-waves on EKG with widening of QRS interval. Unclear if this was a new change on EKG but recommended to come the ED for further evaluation.  The history is provided by the patient and a caregiver (PCP).       Home Medications Prior to Admission medications   Medication Sig Start Date End Date Taking? Authorizing Provider  acetaminophen (TYLENOL) 325 MG tablet Take 2 tablets (650 mg total) by mouth every 6 (six) hours. 02/12/18   Elgergawy, Leana Roe, MD  amLODipine (NORVASC) 5 MG tablet Take 1 tablet (5 mg total) by mouth daily. 09/11/19   Croitoru, Mihai, MD  aspirin EC 81 MG EC tablet Take 1 tablet (81 mg total) by mouth daily. 11/19/17   Barrett, Joline Salt, PA-C  atorvastatin (LIPITOR) 80 MG tablet Take 1 tablet (80 mg total) by mouth daily. 05/16/20   Croitoru, Mihai, MD  BELSOMRA 10 MG TABS Take 1 tablet by mouth at bedtime as needed. 01/09/22   [provider]  benzonatate (TESSALON) 100 MG capsule Take 100 mg  by mouth 3 (three) times daily as needed. 08/14/22   [provider]  furosemide (LASIX) 20 MG tablet Take 20 mg by mouth daily. 06/04/22   [provider]  furosemide (LASIX) 40 MG tablet TAKE ONE TABLET BY MOUTH ONCE DAILY 08/14/22   Croitoru, Mihai, MD  insulin aspart (NOVOLOG) 100 UNIT/ML injection Inject 0-9 Units into the skin 3 (three) times daily with meals. 02/12/18   Elgergawy, Leana Roe, MD  Menthol-Camphor 16-11 % CREA Apply 1 application topically as needed (knee pain).    [provider]  metoprolol succinate (TOPROL-XL) 25 MG 24 hr tablet TAKE 1 TABLET BY MOUTH EVERY DAY 08/14/19   Croitoru, Mihai, MD  mirabegron ER (MYRBETRIQ) 25 MG TB24 tablet Take 1 tablet (25 mg total) by mouth daily. 08/28/22   Stoneking, Danford Bad., MD  nitroGLYCERIN (NITROSTAT) 0.4 MG SL tablet Dissolve 1 tab under tongue as needed for chest pain. May repeat every 5 minutes x 2 more doses. If no relief call 9-1-1. 12/28/20   Croitoru, Rachelle Hora, MD  prednisoLONE acetate (PRED FORTE) 1 % ophthalmic suspension Place 1 drop into the right eye at bedtime. 11/29/17   Medina-Vargas, Monina C, NP  PSYLLIUM PO Take 1 packet by mouth daily.    [provider]  sertraline (ZOLOFT) 50 MG tablet Take 1 tablet (50 mg total) by mouth at bedtime.  11/29/17   Medina-Vargas, Monina C, NP  timolol (TIMOPTIC) 0.5 % ophthalmic solution Place 1 drop into both eyes daily. 06/04/22   [provider]  TRAVATAN Z 0.004 % SOLN ophthalmic solution Place 1 drop into the left eye at bedtime. 11/29/17   Medina-Vargas, Monina C, NP      Allergies    Patient has no known allergies.    Review of Systems   Review of Systems  Physical Exam Updated Vital Signs BP 133/88   Pulse 92   Temp 98.8 F (37.1 C) (Oral)   Resp 15   Ht 5\' 10"  (1.778 m)   Wt 103 kg   SpO2 93%   BMI 32.57 kg/m  Physical Exam Vitals and nursing note reviewed.  Constitutional:      General: He is not in acute distress.     Appearance: Normal appearance.  HENT:     Head: Normocephalic and atraumatic.     Nose: Nose normal.     Mouth/Throat:     Mouth: Mucous membranes are moist.     Pharynx: Oropharynx is clear.  Eyes:     Extraocular Movements: Extraocular movements intact.  Cardiovascular:     Rate and Rhythm: Normal rate and regular rhythm.     Heart sounds: Normal heart sounds.  Pulmonary:     Effort: Pulmonary effort is normal.     Breath sounds: Normal breath sounds.  Abdominal:     General: Abdomen is flat.     Palpations: Abdomen is soft.     Tenderness: There is no abdominal tenderness.  Musculoskeletal:        General: Normal range of motion.     Cervical back: Normal range of motion.  Skin:    General: Skin is warm and dry.  Neurological:     General: No focal deficit present.     Mental Status: He is alert and oriented to person, place, and time.  Psychiatric:        Mood and Affect: Mood normal.        Behavior: Behavior normal.     ED Results / Procedures / Treatments   Labs (all labs ordered are listed, but only abnormal results are displayed) Labs Reviewed  BASIC METABOLIC PANEL - Abnormal; Notable for the following components:      Result Value   Glucose, Bld 130 (*)    GFR, Estimated 56 (*)    All other components within normal limits  CBC  BRAIN NATRIURETIC PEPTIDE  TROPONIN I (HIGH SENSITIVITY)  TROPONIN I (HIGH SENSITIVITY)    EKG EKG Interpretation Date/Time:  Friday January 04 2023 09:36:38 EDT Ventricular Rate:  84 PR Interval:  282 QRS Duration:  144 QT Interval:  412 QTC Calculation: 486 R Axis:   -68  Text Interpretation: Sinus rhythm with 1st degree A-V block Right bundle branch block Left anterior fascicular block, Bifascicular block, Septal infarct , age undetermined Possible Lateral infarct , age undetermined Abnormal ECG No significant change since last tracing Confirmed by Elayne Snare ([id number]) on 01/04/2023 12:13:28 PM  Radiology DG Chest 2  View  Result Date: 01/04/2023 CLINICAL DATA:  Abnormal EKG EXAM: CHEST - 2 VIEW COMPARISON:  X-ray 04/30/2022 FINDINGS: Underinflation. Basilar atelectasis. No pneumothorax or effusion. Enlarged cardiopericardial silhouette with a calcified aorta. Diffuse interstitial changes. Films are under penetrated. Neurostimulator device in place along the midthoracic spine. Right shoulder reverse arthroplasty. Degenerative changes seen of the left shoulder and spine. IMPRESSION: Underinflation with basilar atelectasis. Diffuse interstitial  changes. Acute process is possible. Recommend follow-up. Enlarged heart with calcified and tortuous aorta. Right shoulder reverse arthroplasty. Neurostimulator along the spine Electronically Signed   By: Karen Kays M.D.   On: 01/04/2023 11:15    Procedures Procedures    Medications Ordered in ED Medications  furosemide (LASIX) injection 40 mg (40 mg Intravenous Given 01/04/23 1337)    ED Course/ Medical Decision Making/ A&P Clinical Course as of 01/04/23 1344  Fri Jan 04, 2023  1323 With increased edema on CXR and worsening SOB will order IV lasix. [VK]  1334 BNP normal, Patient will be stable for discharge after lasix with outpatient primary care and cardiology follow up. [VK]    Clinical Course User Index [VK] Rexford Maus, DO                                 Medical Decision Making This patient presents to the ED with chief complaint(s) of abnormal EKG with pertinent past medical history of CHF, CAD, HTN, DM which further complicates the presenting complaint. The complaint involves an extensive differential diagnosis and also carries with it a high risk of complications and morbidity.    The differential diagnosis includes ACS, arrhythmia, electrolyte abnormality, anemia  Additional history obtained: Additional history obtained from PCP, care taker Records reviewed outpatient cardiology records  ED Course and Reassessment: On patient's arrival to  the emergency department he was hemodynamically stable in no acute distress.  EKG performed here showed first-degree AV block with bifascicular block which is similar compared to prior EKG.  He had labs including troponin and chest x-ray and will additionally have BNP to evaluate for cause of his shortness of breath and will be closely reassessed.  Independent labs interpretation:  The following labs were independently interpreted: within normal range  Independent visualization of imaging: - I independently visualized the following imaging with scope of interpretation limited to determining acute life threatening conditions related to emergency care: CXR, which revealed increased interstitial edema  Consultation: - Consulted or discussed management/test interpretation w/ external professional: N/A  Consideration for admission or further workup: Patient has no emergent conditions requiring admission or further work-up at this time and is stable for discharge home with primary care and cardiology follow-up  Social Determinants of health: N/A    Amount and/or Complexity of Data Reviewed Labs: ordered. Radiology: ordered.  Risk Prescription drug management.          Final Clinical Impression(s) / ED Diagnoses Final diagnoses:  Acute pulmonary edema (HCC)  First degree heart block    Rx / DC Orders ED Discharge Orders     None         Rexford Maus, DO 01/04/23 1344

## 2023-01-04 NOTE — ED Notes (Addendum)
I spoke with [name] son on the phone He advised me that he could NOT come up here and sit with his father and "what do we need to know". He stated that he wasn't coming up here and if we needed anymore information we needed to call his PCP.

## 2023-01-04 NOTE — ED Triage Notes (Signed)
Pt presents d/t abnormal EKG.  Pt denies complaints.

## 2023-01-28 ENCOUNTER — Encounter: Payer: Self-pay | Admitting: Podiatry

## 2023-01-28 ENCOUNTER — Ambulatory Visit: Payer: Medicare HMO | Admitting: Podiatry

## 2023-01-28 DIAGNOSIS — E119 Type 2 diabetes mellitus without complications: Secondary | ICD-10-CM

## 2023-01-28 DIAGNOSIS — B351 Tinea unguium: Secondary | ICD-10-CM | POA: Diagnosis not present

## 2023-01-28 DIAGNOSIS — M79675 Pain in left toe(s): Secondary | ICD-10-CM | POA: Diagnosis not present

## 2023-01-28 DIAGNOSIS — M79674 Pain in right toe(s): Secondary | ICD-10-CM | POA: Diagnosis not present

## 2023-01-28 DIAGNOSIS — E118 Type 2 diabetes mellitus with unspecified complications: Secondary | ICD-10-CM

## 2023-01-28 NOTE — Progress Notes (Signed)
  Subjective:  Patient ID: [name], male    DOB: [birth year],  MRN: [id number]  Chief Complaint  Patient presents with   Diabetes    "Do his toenails."    89 y.o. male presents with the above complaint. History confirmed with patient.  Here with his son as well.  Says his diabetes is well controlled.  They missed their last appointment would like to recent establish routine regular footcare  Objective:  Physical Exam: warm, good capillary refill, no trophic changes or ulcerative lesions, normal DP and PT pulses, normal monofilament exam, and normal sensory exam. Left Foot: dystrophic yellowed discolored nail plates with subungual debris Right Foot: dystrophic yellowed discolored nail plates with subungual debris  Assessment:   1. Pain due to onychomycosis of toenails of both feet   2. Type 2 diabetes mellitus with complication, without long-term current use of insulin (HCC)   3. Encounter for diabetic foot exam [facility])      Plan:  Patient was evaluated and treated and all questions answered.  Patient educated on diabetes. Discussed proper diabetic foot care and discussed risks and complications of disease. Educated patient in depth on reasons to return to the office immediately should he/she discover anything concerning or new on the feet. All questions answered. Discussed proper shoes as well.  Annual diabetic risk assessment was performed today  Discussed the etiology and treatment options for the condition in detail with the patient. Educated patient on the topical and oral treatment options for mycotic nails. Recommended debridement of the nails today. Sharp and mechanical debridement performed of all painful and mycotic nails today. Nails debrided in length and thickness using a nail nipper to level of comfort. Discussed treatment options including appropriate shoe gear. Follow up as needed for painful nails.    Return in about 3 months (around 04/30/2023) for at risk  diabetic foot care.

## 2023-04-30 ENCOUNTER — Ambulatory Visit: Payer: Medicare HMO | Admitting: Podiatry

## 2023-05-07 ENCOUNTER — Other Ambulatory Visit: Payer: Self-pay

## 2023-05-07 DIAGNOSIS — E210 Primary hyperparathyroidism: Secondary | ICD-10-CM

## 2023-05-08 ENCOUNTER — Other Ambulatory Visit: Payer: Medicare HMO

## 2023-05-13 ENCOUNTER — Ambulatory Visit: Payer: Medicare HMO | Admitting: "Endocrinology

## 2023-05-14 ENCOUNTER — Ambulatory Visit: Payer: Medicare HMO | Admitting: Podiatry

## 2023-09-19 ENCOUNTER — Other Ambulatory Visit: Payer: Self-pay | Admitting: Cardiovascular Disease

## 2023-12-20 ENCOUNTER — Encounter: Payer: Self-pay | Admitting: Advanced Practice Midwife

## 2023-12-26 ENCOUNTER — Encounter (HOSPITAL_COMMUNITY): Payer: Self-pay

## 2023-12-26 ENCOUNTER — Emergency Department (HOSPITAL_COMMUNITY)

## 2023-12-26 ENCOUNTER — Emergency Department (HOSPITAL_COMMUNITY)
Admission: EM | Admit: 2023-12-26 | Discharge: 2023-12-26 | Disposition: A | Discharge status: Home / Self Care | Attending: Emergency Medicine | Admitting: Emergency Medicine

## 2023-12-26 ENCOUNTER — Other Ambulatory Visit: Payer: Self-pay

## 2023-12-26 DIAGNOSIS — N3001 Acute cystitis with hematuria: Secondary | ICD-10-CM | POA: Diagnosis not present

## 2023-12-26 DIAGNOSIS — E860 Dehydration: Secondary | ICD-10-CM | POA: Diagnosis not present

## 2023-12-26 DIAGNOSIS — I5040 Unspecified combined systolic (congestive) and diastolic (congestive) heart failure: Secondary | ICD-10-CM | POA: Diagnosis not present

## 2023-12-26 DIAGNOSIS — Z794 Long term (current) use of insulin: Secondary | ICD-10-CM | POA: Diagnosis not present

## 2023-12-26 DIAGNOSIS — I2510 Atherosclerotic heart disease of native coronary artery without angina pectoris: Secondary | ICD-10-CM | POA: Insufficient documentation

## 2023-12-26 DIAGNOSIS — R197 Diarrhea, unspecified: Secondary | ICD-10-CM | POA: Insufficient documentation

## 2023-12-26 DIAGNOSIS — Z7982 Long term (current) use of aspirin: Secondary | ICD-10-CM | POA: Diagnosis not present

## 2023-12-26 DIAGNOSIS — R1084 Generalized abdominal pain: Secondary | ICD-10-CM | POA: Diagnosis not present

## 2023-12-26 DIAGNOSIS — E119 Type 2 diabetes mellitus without complications: Secondary | ICD-10-CM | POA: Diagnosis not present

## 2023-12-26 LAB — CBC WITH DIFFERENTIAL/PLATELET
Abs Immature Granulocytes: 0.03 K/uL (ref 0.00–0.07)
Basophils Absolute: 0 K/uL (ref 0.0–0.1)
Basophils Relative: 0 %
Eosinophils Absolute: 0.2 K/uL (ref 0.0–0.5)
Eosinophils Relative: 4 %
HCT: 38.3 % — ABNORMAL LOW (ref 39.0–52.0)
Hemoglobin: 12.5 g/dL — ABNORMAL LOW (ref 13.0–17.0)
Immature Granulocytes: 1 %
Lymphocytes Relative: 16 %
Lymphs Abs: 0.9 K/uL (ref 0.7–4.0)
MCH: 29.6 pg (ref 26.0–34.0)
MCHC: 32.6 g/dL (ref 30.0–36.0)
MCV: 90.8 fL (ref 80.0–100.0)
Monocytes Absolute: 0.5 K/uL (ref 0.1–1.0)
Monocytes Relative: 9 %
Neutro Abs: 3.9 K/uL (ref 1.7–7.7)
Neutrophils Relative %: 70 %
Platelets: 161 K/uL (ref 150–400)
RBC: 4.22 MIL/uL (ref 4.22–5.81)
RDW: 13.4 % (ref 11.5–15.5)
WBC: 5.7 K/uL (ref 4.0–10.5)
nRBC: 0 % (ref 0.0–0.2)

## 2023-12-26 LAB — COMPREHENSIVE METABOLIC PANEL WITH GFR
ALT: 14 U/L (ref 0–44)
AST: 19 U/L (ref 15–41)
Albumin: 3 g/dL — ABNORMAL LOW (ref 3.5–5.0)
Alkaline Phosphatase: 70 U/L (ref 38–126)
Anion gap: 9 (ref 5–15)
BUN: 16 mg/dL (ref 8–23)
CO2: 26 mmol/L (ref 22–32)
Calcium: 8.8 mg/dL — ABNORMAL LOW (ref 8.9–10.3)
Chloride: 103 mmol/L (ref 98–111)
Creatinine, Ser: 1.22 mg/dL (ref 0.61–1.24)
GFR, Estimated: 56 mL/min — ABNORMAL LOW (ref >60)
Glucose, Bld: 172 mg/dL — ABNORMAL HIGH (ref 70–99)
Potassium: 3.8 mmol/L (ref 3.5–5.1)
Sodium: 138 mmol/L (ref 135–145)
Total Bilirubin: 0.6 mg/dL (ref 0.0–1.2)
Total Protein: 6.5 g/dL (ref 6.5–8.1)

## 2023-12-26 LAB — URINALYSIS, ROUTINE W REFLEX MICROSCOPIC
Bilirubin Urine: NEGATIVE
Glucose, UA: NEGATIVE mg/dL
Ketones, ur: NEGATIVE mg/dL
Nitrite: NEGATIVE
Protein, ur: NEGATIVE mg/dL
Specific Gravity, Urine: 1.016 (ref 1.005–1.030)
WBC, UA: >50 WBC/hpf (ref 0–5)
pH: 7 (ref 5.0–8.0)

## 2023-12-26 LAB — LIPASE, BLOOD: Lipase: 41 U/L (ref 11–51)

## 2023-12-26 MED ORDER — CEPHALEXIN 500 MG PO CAPS
500.0000 mg | ORAL_CAPSULE | Freq: Four times a day (QID) | ORAL | 0 refills | Status: DC
Start: 2023-12-26 — End: 2024-04-07

## 2023-12-26 MED ORDER — CEPHALEXIN 250 MG PO CAPS
500.0000 mg | ORAL_CAPSULE | Freq: Once | ORAL | Status: AC
Start: 2023-12-26 — End: 2023-12-26
  Administered 2023-12-26: 500 mg via ORAL
  Filled 2023-12-26: qty 2

## 2023-12-26 MED ORDER — IOHEXOL 350 MG/ML SOLN
75.0000 mL | Freq: Once | INTRAVENOUS | Status: AC | PRN
  Administered 2023-12-26: 75 mL via INTRAVENOUS

## 2023-12-26 NOTE — ED Notes (Signed)
 Spoke to the daughter, daughter agreed to picking up the pt for transportation home.

## 2023-12-26 NOTE — ED Provider Notes (Signed)
 [place] EMERGENCY DEPARTMENT AT [facility] Provider Note   CSN: [id number] Arrival date & time: 12/26/23  1824     Patient presents with: Diarrhea   [name] is a 89 y.o. male who presents to the ED today with a 1 day history of diarrhea as well as weakness.  Awoke this morning with multiple loose stools, denies having any hematochezia however states he is visually impaired at baseline, unable to accurately describe his stools.  It is noted this patient has previous diagnoses of type 2 diabetes, previous coronary artery disease, combined systolic and diastolic heart failure, and recurrent syncope.  Last oral intake was this morning, has not been able to tolerate any oral intake, solid or liquid, since first thing this morning.  States that he has loose stools with any oral intake beyond what he ate this morning for breakfast.  Further states that he has generalized abdominal discomfort.    Diarrhea Associated symptoms: abdominal pain        Prior to Admission medications   Medication Sig Start Date End Date Taking? Authorizing Provider  acetaminophen  (TYLENOL ) 325 MG tablet Take 2 tablets (650 mg total) by mouth every 6 (six) hours. 02/12/18   Elgergawy, Brayton RAMAN, MD  amLODipine  (NORVASC ) 5 MG tablet Take 1 tablet (5 mg total) by mouth daily. 09/11/19   Croitoru, Mihai, MD  aspirin  EC 81 MG EC tablet Take 1 tablet (81 mg total) by mouth daily. 11/19/17   Barrett, Shona MATSU, PA-C  atorvastatin  (LIPITOR ) 80 MG tablet Take 1 tablet (80 mg total) by mouth daily. 05/16/20   Croitoru, Mihai, MD  BELSOMRA 10 MG TABS Take 1 tablet by mouth at bedtime as needed. 01/09/22   [provider]  benzonatate (TESSALON) 100 MG capsule Take 100 mg by mouth 3 (three) times daily as needed. 08/14/22   [provider]  furosemide  (LASIX ) 20 MG tablet Take 20 mg by mouth daily. 06/04/22   [provider]  furosemide  (LASIX ) 40 MG tablet TAKE ONE TABLET BY MOUTH ONCE DAILY  08/14/22   Croitoru, Jerel, MD  insulin  aspart (NOVOLOG ) 100 UNIT/ML injection Inject 0-9 Units into the skin 3 (three) times daily with meals. 02/12/18   Elgergawy, Brayton RAMAN, MD  Menthol -Camphor 16-11 % CREA Apply 1 application topically as needed (knee pain).    [provider]  metoprolol  succinate (TOPROL -XL) 25 MG 24 hr tablet TAKE 1 TABLET BY MOUTH EVERY DAY 08/14/19   Croitoru, Mihai, MD  mirabegron  ER (MYRBETRIQ ) 25 MG TB24 tablet Take 1 tablet (25 mg total) by mouth daily. 08/28/22   Stoneking, Adine PARAS., MD  nitroGLYCERIN  (NITROSTAT ) 0.4 MG SL tablet Dissolve 1 tab under tongue as needed for chest pain. May repeat every 5 minutes x 2 more doses. If no relief call 9-1-1. 12/28/20   Croitoru, Jerel, MD  prednisoLONE  acetate (PRED FORTE ) 1 % ophthalmic suspension Place 1 drop into the right eye at bedtime. 11/29/17   Medina-Vargas, Monina C, NP  PSYLLIUM PO Take 1 packet by mouth daily.    [provider]  sertraline  (ZOLOFT ) 50 MG tablet Take 1 tablet (50 mg total) by mouth at bedtime. 11/29/17   Medina-Vargas, Monina C, NP  timolol  (TIMOPTIC ) 0.5 % ophthalmic solution Place 1 drop into both eyes daily. 06/04/22   [provider]  TRAVATAN  Z 0.004 % SOLN ophthalmic solution Place 1 drop into the left eye at bedtime. 11/29/17   Medina-Vargas, Monina C, NP    Allergies:  Patient has no known allergies.    Review of Systems  Gastrointestinal:  Positive for abdominal pain and diarrhea.  All other systems reviewed and are negative.   Updated Vital Signs BP (!) 140/80   Pulse 66   Temp 98.3 F (36.8 C) (Oral)   Resp 15   Ht 5' 10 (1.778 m)   Wt 83.9 kg   SpO2 90%   BMI 26.54 kg/m   Physical Exam Vitals and nursing note reviewed.  Constitutional:      General: He is not in acute distress.    Appearance: Normal appearance.  HENT:     Head: Normocephalic and atraumatic.     Mouth/Throat:     Mouth: Mucous membranes are moist.     Pharynx: Oropharynx is clear.   Eyes:     General: Lids are normal.     Extraocular Movements: Extraocular movements intact.     Conjunctiva/sclera: Conjunctivae normal.     Pupils: Pupils are equal, round, and reactive to light.     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  Cardiovascular:     Rate and Rhythm: Normal rate and regular rhythm.     Pulses: Normal pulses.     Heart sounds: Normal heart sounds. No murmur heard.    No friction rub. No gallop.  Pulmonary:     Effort: Pulmonary effort is normal.     Breath sounds: Normal breath sounds.  Abdominal:     General: Abdomen is flat. Bowel sounds are normal.     Palpations: Abdomen is soft.     Tenderness: There is generalized abdominal tenderness.     Comments: Endorses mild generalized tenderness.  Musculoskeletal:        General: Normal range of motion.     Cervical back: Normal range of motion and neck supple.     Right lower leg: No edema.     Left lower leg: No edema.  Skin:    General: Skin is warm and dry.     Capillary Refill: Capillary refill takes less than 2 seconds.  Neurological:     General: No focal deficit present.     Mental Status: He is alert. Mental status is at baseline.  Psychiatric:        Mood and Affect: Mood normal.     (all labs ordered are listed, but only abnormal results are displayed) Labs Reviewed  COMPREHENSIVE METABOLIC PANEL WITH GFR - Abnormal; Notable for the following components:      Result Value   Glucose, Bld 172 (*)    Calcium  8.8 (*)    Albumin 3.0 (*)    GFR, Estimated 56 (*)    All other components within normal limits  CBC WITH DIFFERENTIAL/PLATELET - Abnormal; Notable for the following components:   Hemoglobin 12.5 (*)    HCT 38.3 (*)    All other components within normal limits  C DIFFICILE QUICK SCREEN W PCR REFLEX    LIPASE, BLOOD  URINALYSIS, ROUTINE W REFLEX MICROSCOPIC     EKG: None  Radiology: CT ABDOMEN PELVIS W CONTRAST Result Date: 12/26/2023 EXAM: CT ABDOMEN AND PELVIS WITH CONTRAST 12/26/2023 08:38:00 PM TECHNIQUE: CT of the abdomen and pelvis was performed with the administration of intravenous contrast (75mL iohexol  (OMNIPAQUE ) 350 MG/ML injection). Multiplanar reformatted images are provided for review. Automated exposure control, iterative reconstruction, and/or weight based adjustment of the mA/kV was utilized to reduce  the radiation dose to as low as reasonably achievable. COMPARISON: None available. CLINICAL HISTORY: Abdominal pain, acute, nonlocalized. FINDINGS: LOWER CHEST: Lingular and bilateral lower lobe patchy opacities, likely atelectasis. LIVER: Subcentimeter right hepatic cyst (image 16), benign. GALLBLADDER AND BILE DUCTS: Gallbladder is unremarkable. No biliary ductal dilatation. SPLEEN: No acute abnormality. PANCREAS: No acute abnormality. ADRENAL GLANDS: No acute abnormality. KIDNEYS, URETERS AND BLADDER: Bilateral simple renal cysts, measuring up to 5.2 cm in the left upper kidney (image 35), benign (Bosniak 1). No follow up is recommended. No stones in the kidneys or ureters. No hydronephrosis. No perinephric or periureteral stranding. Mildly thick-walled bladder, although underdistended, suggesting chronic bladder outlet obstruction. GI AND BOWEL: Mild left colonic diverticulosis, without evidence of diverticulitis. Normal appendix (image 46). Stomach demonstrates no acute abnormality. There is no bowel obstruction. No bowel wall thickening. PERITONEUM AND RETROPERITONEUM: No ascites. No free air. VASCULATURE: Atherosclerotic calcifications of the abdominal aorta and branch vessels, although patent. LYMPH NODES: No lymphadenopathy. REPRODUCTIVE ORGANS: Prostatomegaly, with enlargement of the central gland indenting the base of the bladder, suggesting BPH. BONES AND SOFT TISSUES: Thoracic spine stimulator. Degenerative changes of the visualized  thoracolumbar spine. Mild superior endplate compression fracture deformity at L1 (sagittal image 31), age indeterminate. Mild superior endplate changes at L2, chronic. No acute osseous abnormality. No focal soft tissue abnormality. IMPRESSION: 1. Mild superior endplate compression fracture at L1, age indeterminate. Correlate for point tenderness. 2. Otherwise, no acute findings in the abdomen or pelvis. 3. Prostatomegaly, suggesting BPH. Suspected chronic bladder outlet obstruction Electronically signed by: Pinkie Pebbles MD 12/26/2023 08:49 PM EDT RP Workstation: [id number]     Procedures   Medications Ordered in the ED  iohexol  (OMNIPAQUE ) 350 MG/ML injection 75 mL (75 mLs Intravenous Contrast Given 12/26/23 2035)    Clinical Course as of 12/26/23 2139  Thu Dec 26, 2023  2137 GFR, Estimated(!): 56 Noted, this is stable to previous finding 1 year prior. [JG]    Clinical Course User Index [JG] Myriam Dorn BROCKS, PA                                 Medical Decision Making Amount and/or Complexity of Data Reviewed Labs: ordered. Decision-making details documented in ED Course. Radiology: ordered.  Risk Prescription drug management.   Medical Decision Making:   [name] is a 89 y.o. male who presented to the ED today with diarrhea and weakness detailed above.    Handoff received from EMS.  Complete initial physical exam performed, notably the patient  was alert and oriented and in no apparent distress.  Physical exam as noted with only notable finding of generalized mild abdominal tenderness..    Reviewed and confirmed nursing documentation for past medical history, family history, social history.    Initial Assessment:   With the patient's presentation of abdominal pain and weakness, most likely diagnosis is infectious colitis, bowel obstruction.  Initial Plan:  Obtain CT imaging of the abdomen to evaluate for acute intra-abdominal pathology. Screening labs including  CBC and Metabolic panel to evaluate for infectious or metabolic etiology of disease.  Urinalysis with reflex culture ordered to evaluate for UTI or relevant urologic/nephrologic pathology.  Objective evaluation as below reviewed   Initial Study Results:   Laboratory  All laboratory results reviewed without evidence of clinically relevant pathology.   Exceptions include:  GFR is reduced to 56 was a stable compared to readings from 1 year prior urinary  results show large leukocytes, bacteria, and hemoglobin presents.  Radiology:  All images reviewed independently. Agree with radiology report at this time.   CT ABDOMEN PELVIS W CONTRAST Result Date: 12/26/2023 EXAM: CT ABDOMEN AND PELVIS WITH CONTRAST 12/26/2023 08:38:00 PM TECHNIQUE: CT of the abdomen and pelvis was performed with the administration of intravenous contrast (75mL iohexol  (OMNIPAQUE ) 350 MG/ML injection). Multiplanar reformatted images are provided for review. Automated exposure control, iterative reconstruction, and/or weight based adjustment of the mA/kV was utilized to reduce the radiation dose to as low as reasonably achievable. COMPARISON: None available. CLINICAL HISTORY: Abdominal pain, acute, nonlocalized. FINDINGS: LOWER CHEST: Lingular and bilateral lower lobe patchy opacities, likely atelectasis. LIVER: Subcentimeter right hepatic cyst (image 16), benign. GALLBLADDER AND BILE DUCTS: Gallbladder is unremarkable. No biliary ductal dilatation. SPLEEN: No acute abnormality. PANCREAS: No acute abnormality. ADRENAL GLANDS: No acute abnormality. KIDNEYS, URETERS AND BLADDER: Bilateral simple renal cysts, measuring up to 5.2 cm in the left upper kidney (image 35), benign (Bosniak 1). No follow up is recommended. No stones in the kidneys or ureters. No hydronephrosis. No perinephric or periureteral stranding. Mildly thick-walled bladder, although underdistended, suggesting chronic bladder outlet obstruction. GI AND BOWEL: Mild left colonic  diverticulosis, without evidence of diverticulitis. Normal appendix (image 46). Stomach demonstrates no acute abnormality. There is no bowel obstruction. No bowel wall thickening. PERITONEUM AND RETROPERITONEUM: No ascites. No free air. VASCULATURE: Atherosclerotic calcifications of the abdominal aorta and branch vessels, although patent. LYMPH NODES: No lymphadenopathy. REPRODUCTIVE ORGANS: Prostatomegaly, with enlargement of the central gland indenting the base of the bladder, suggesting BPH. BONES AND SOFT TISSUES: Thoracic spine stimulator. Degenerative changes of the visualized thoracolumbar spine. Mild superior endplate compression fracture deformity at L1 (sagittal image 44), age indeterminate. Mild superior endplate changes at L2, chronic. No acute osseous abnormality. No focal soft tissue abnormality. IMPRESSION: 1. Mild superior endplate compression fracture at L1, age indeterminate. Correlate for point tenderness. 2. Otherwise, no acute findings in the abdomen or pelvis. 3. Prostatomegaly, suggesting BPH. Suspected chronic bladder outlet obstruction Electronically signed by: Pinkie Pebbles MD 12/26/2023 08:49 PM EDT RP Workstation: [id number]     Reassessment and Plan:   On reassessment of the patient, he does not have any spinal midline tenderness, nor does he have any paraspinal tenderness in the mentioned area related to the L1 compression fracture.  He does have a recent history of falls however has not had any back pain or any other neuropathy or weakness associated.  Weakness that he is experiencing today is secondary to the chronic diarrhea that he has had over the last 24 hours.  Review of the CT findings shows that there is no acute findings in the abdomen or pelvis.  This makes likely diagnosis of gastroenteritis, plan to discharge with increased oral hydration, follow-up with primary care in the next 1 to 2 weeks for monitoring of progression.   Urine does show positive for leukocytes,  bacteria, hemoglobinuria.  As such we will begin treatment outpatient for urinary tract infection.  Urine culture to be sent off.     Final diagnoses:  None    ED Discharge Orders     None          Myriam Dorn BROCKS, [place] 12/26/23 2207    Doretha Folks, MD 12/27/23 [phone]

## 2023-12-26 NOTE — ED Triage Notes (Incomplete)
 Pt coming from home arrived by [facility], pt c/o dizziness and diarrhea since this a.m. hx dementia. Reports at baseline cognitively

## 2023-12-26 NOTE — ED Notes (Signed)
 Patient transported to CT

## 2023-12-30 LAB — URINE CULTURE: Culture: 40000 — AB

## 2023-12-31 ENCOUNTER — Telehealth (HOSPITAL_BASED_OUTPATIENT_CLINIC_OR_DEPARTMENT_OTHER): Payer: Self-pay | Admitting: *Deleted

## 2023-12-31 NOTE — Telephone Encounter (Signed)
 Post ED Visit - Positive Culture Follow-up  Culture report reviewed by antimicrobial stewardship pharmacist: [facility] [x]  Leonor Bash Pharm.D. []  Venetia Gully, Pharm.D., BCPS AQ-ID []  [name] Crews, Pharm.D., BCPS []  Almarie Lunger, [address].D., BCPS []  Blades, [address].D., BCPS, AAHIVP []  Rosaline Bihari, Pharm.D., BCPS, AAHIVP []  Vernell Meier, PharmD, BCPS []  Latanya Hint, PharmD, BCPS []  Donald Medley, PharmD, BCPS []  Rocky Bold, PharmD []  Dorothyann Alert, PharmD, BCPS []  Morene Babe, PharmD  [facility] []  Rosaline Edison, PharmD []  Romona Bliss, PharmD []  Dolphus Roller, PharmD []  Veva Seip, Rph []  Vernell Daunt) Leonce, PharmD []  Eva Allis, PharmD []  Rosaline Millet, PharmD []  Iantha Batch, PharmD []  Arvin Gauss, PharmD []  Wanda Hasting, PharmD []  Ronal Rav, PharmD []  Rocky Slade, PharmD []  Bard Jeans, PharmD   Positive urine culture Treated with Cephalexin , organism sensitive to the same and no further patient follow-up is required at this time.  [name] 12/31/2023, 10:05 AM

## 2024-01-13 ENCOUNTER — Other Ambulatory Visit: Payer: Self-pay | Admitting: Cardiovascular Disease

## 2024-04-06 ENCOUNTER — Emergency Department (HOSPITAL_COMMUNITY)

## 2024-04-06 ENCOUNTER — Other Ambulatory Visit: Payer: Self-pay

## 2024-04-06 ENCOUNTER — Inpatient Hospital Stay (HOSPITAL_COMMUNITY)
Admission: EM | Admit: 2024-04-06 | Discharge: 2024-04-16 | DRG: 956 | Disposition: A | Discharge status: Skilled Nursing Facility | Attending: Internal Medicine | Admitting: Internal Medicine

## 2024-04-06 ENCOUNTER — Inpatient Hospital Stay (HOSPITAL_COMMUNITY)

## 2024-04-06 DIAGNOSIS — E860 Dehydration: Secondary | ICD-10-CM | POA: Diagnosis present

## 2024-04-06 DIAGNOSIS — Z6827 Body mass index (BMI) 27.0-27.9, adult: Secondary | ICD-10-CM

## 2024-04-06 DIAGNOSIS — H548 Legal blindness, as defined in USA: Secondary | ICD-10-CM | POA: Diagnosis present

## 2024-04-06 DIAGNOSIS — I959 Hypotension, unspecified: Secondary | ICD-10-CM

## 2024-04-06 DIAGNOSIS — E8729 Other acidosis: Secondary | ICD-10-CM | POA: Diagnosis not present

## 2024-04-06 DIAGNOSIS — Z955 Presence of coronary angioplasty implant and graft: Secondary | ICD-10-CM | POA: Diagnosis not present

## 2024-04-06 DIAGNOSIS — S065XAA Traumatic subdural hemorrhage with loss of consciousness status unknown, initial encounter: Secondary | ICD-10-CM | POA: Diagnosis present

## 2024-04-06 DIAGNOSIS — Z7982 Long term (current) use of aspirin: Secondary | ICD-10-CM | POA: Diagnosis not present

## 2024-04-06 DIAGNOSIS — Z7401 Bed confinement status: Secondary | ICD-10-CM | POA: Diagnosis not present

## 2024-04-06 DIAGNOSIS — Z8673 Personal history of transient ischemic attack (TIA), and cerebral infarction without residual deficits: Secondary | ICD-10-CM

## 2024-04-06 DIAGNOSIS — Z820 Family history of epilepsy and other diseases of the nervous system: Secondary | ICD-10-CM

## 2024-04-06 DIAGNOSIS — S72002A Fracture of unspecified part of neck of left femur, initial encounter for closed fracture: Secondary | ICD-10-CM | POA: Diagnosis not present

## 2024-04-06 DIAGNOSIS — I255 Ischemic cardiomyopathy: Secondary | ICD-10-CM | POA: Diagnosis present

## 2024-04-06 DIAGNOSIS — E1122 Type 2 diabetes mellitus with diabetic chronic kidney disease: Secondary | ICD-10-CM | POA: Diagnosis present

## 2024-04-06 DIAGNOSIS — Z9842 Cataract extraction status, left eye: Secondary | ICD-10-CM

## 2024-04-06 DIAGNOSIS — W1830XA Fall on same level, unspecified, initial encounter: Secondary | ICD-10-CM | POA: Diagnosis present

## 2024-04-06 DIAGNOSIS — R0902 Hypoxemia: Secondary | ICD-10-CM | POA: Diagnosis present

## 2024-04-06 DIAGNOSIS — E872 Acidosis, unspecified: Secondary | ICD-10-CM | POA: Diagnosis present

## 2024-04-06 DIAGNOSIS — E669 Obesity, unspecified: Secondary | ICD-10-CM | POA: Diagnosis present

## 2024-04-06 DIAGNOSIS — M109 Gout, unspecified: Secondary | ICD-10-CM | POA: Diagnosis present

## 2024-04-06 DIAGNOSIS — R571 Hypovolemic shock: Secondary | ICD-10-CM | POA: Diagnosis present

## 2024-04-06 DIAGNOSIS — S72012A Unspecified intracapsular fracture of left femur, initial encounter for closed fracture: Secondary | ICD-10-CM | POA: Diagnosis present

## 2024-04-06 DIAGNOSIS — N390 Urinary tract infection, site not specified: Secondary | ICD-10-CM | POA: Diagnosis present

## 2024-04-06 DIAGNOSIS — N179 Acute kidney failure, unspecified: Secondary | ICD-10-CM | POA: Diagnosis present

## 2024-04-06 DIAGNOSIS — N1831 Chronic kidney disease, stage 3a: Secondary | ICD-10-CM | POA: Diagnosis present

## 2024-04-06 DIAGNOSIS — I9589 Other hypotension: Secondary | ICD-10-CM | POA: Diagnosis present

## 2024-04-06 DIAGNOSIS — E7849 Other hyperlipidemia: Secondary | ICD-10-CM | POA: Diagnosis present

## 2024-04-06 DIAGNOSIS — Z96611 Presence of right artificial shoulder joint: Secondary | ICD-10-CM | POA: Diagnosis present

## 2024-04-06 DIAGNOSIS — I130 Hypertensive heart and chronic kidney disease with heart failure and stage 1 through stage 4 chronic kidney disease, or unspecified chronic kidney disease: Secondary | ICD-10-CM | POA: Diagnosis present

## 2024-04-06 DIAGNOSIS — Z794 Long term (current) use of insulin: Secondary | ICD-10-CM

## 2024-04-06 DIAGNOSIS — Z860100 Personal history of colon polyps, unspecified: Secondary | ICD-10-CM

## 2024-04-06 DIAGNOSIS — I252 Old myocardial infarction: Secondary | ICD-10-CM

## 2024-04-06 DIAGNOSIS — I2510 Atherosclerotic heart disease of native coronary artery without angina pectoris: Secondary | ICD-10-CM | POA: Diagnosis present

## 2024-04-06 DIAGNOSIS — F32A Depression, unspecified: Secondary | ICD-10-CM | POA: Diagnosis present

## 2024-04-06 DIAGNOSIS — I5022 Chronic systolic (congestive) heart failure: Secondary | ICD-10-CM | POA: Diagnosis not present

## 2024-04-06 DIAGNOSIS — Z8711 Personal history of peptic ulcer disease: Secondary | ICD-10-CM

## 2024-04-06 DIAGNOSIS — E785 Hyperlipidemia, unspecified: Secondary | ICD-10-CM | POA: Diagnosis not present

## 2024-04-06 DIAGNOSIS — R40241 Glasgow coma scale score 13-15, unspecified time: Secondary | ICD-10-CM | POA: Diagnosis present

## 2024-04-06 DIAGNOSIS — Z604 Social exclusion and rejection: Secondary | ICD-10-CM | POA: Diagnosis present

## 2024-04-06 DIAGNOSIS — I509 Heart failure, unspecified: Secondary | ICD-10-CM | POA: Diagnosis not present

## 2024-04-06 DIAGNOSIS — W19XXXA Unspecified fall, initial encounter: Secondary | ICD-10-CM | POA: Diagnosis not present

## 2024-04-06 DIAGNOSIS — I10 Essential (primary) hypertension: Secondary | ICD-10-CM | POA: Diagnosis not present

## 2024-04-06 DIAGNOSIS — Z961 Presence of intraocular lens: Secondary | ICD-10-CM | POA: Diagnosis present

## 2024-04-06 DIAGNOSIS — Z01810 Encounter for preprocedural cardiovascular examination: Secondary | ICD-10-CM | POA: Diagnosis not present

## 2024-04-06 DIAGNOSIS — F17210 Nicotine dependence, cigarettes, uncomplicated: Secondary | ICD-10-CM | POA: Diagnosis present

## 2024-04-06 DIAGNOSIS — E119 Type 2 diabetes mellitus without complications: Secondary | ICD-10-CM

## 2024-04-06 DIAGNOSIS — Z79899 Other long term (current) drug therapy: Secondary | ICD-10-CM

## 2024-04-06 DIAGNOSIS — I5042 Chronic combined systolic (congestive) and diastolic (congestive) heart failure: Secondary | ICD-10-CM | POA: Diagnosis present

## 2024-04-06 DIAGNOSIS — Z87440 Personal history of urinary (tract) infections: Secondary | ICD-10-CM

## 2024-04-06 DIAGNOSIS — E1169 Type 2 diabetes mellitus with other specified complication: Secondary | ICD-10-CM | POA: Diagnosis present

## 2024-04-06 DIAGNOSIS — Z9841 Cataract extraction status, right eye: Secondary | ICD-10-CM

## 2024-04-06 DIAGNOSIS — I110 Hypertensive heart disease with heart failure: Secondary | ICD-10-CM | POA: Diagnosis not present

## 2024-04-06 DIAGNOSIS — Z59868 Other specified financial insecurity: Secondary | ICD-10-CM

## 2024-04-06 DIAGNOSIS — I453 Trifascicular block: Secondary | ICD-10-CM | POA: Diagnosis not present

## 2024-04-06 DIAGNOSIS — M810 Age-related osteoporosis without current pathological fracture: Secondary | ICD-10-CM | POA: Diagnosis present

## 2024-04-06 DIAGNOSIS — I5021 Acute systolic (congestive) heart failure: Secondary | ICD-10-CM | POA: Diagnosis not present

## 2024-04-06 DIAGNOSIS — R32 Unspecified urinary incontinence: Secondary | ICD-10-CM | POA: Diagnosis present

## 2024-04-06 LAB — CBC WITH DIFFERENTIAL/PLATELET
Abs Immature Granulocytes: 0.12 K/uL — ABNORMAL HIGH (ref 0.00–0.07)
Basophils Absolute: 0 K/uL (ref 0.0–0.1)
Basophils Relative: 1 %
Eosinophils Absolute: 0.3 K/uL (ref 0.0–0.5)
Eosinophils Relative: 4 %
HCT: 41.4 % (ref 39.0–52.0)
Hemoglobin: 13.5 g/dL (ref 13.0–17.0)
Immature Granulocytes: 2 %
Lymphocytes Relative: 16 %
Lymphs Abs: 1.1 K/uL (ref 0.7–4.0)
MCH: 29.1 pg (ref 26.0–34.0)
MCHC: 32.6 g/dL (ref 30.0–36.0)
MCV: 89.2 fL (ref 80.0–100.0)
Monocytes Absolute: 0.6 K/uL (ref 0.1–1.0)
Monocytes Relative: 8 %
Neutro Abs: 4.8 K/uL (ref 1.7–7.7)
Neutrophils Relative %: 69 %
Platelets: 203 K/uL (ref 150–400)
RBC: 4.64 MIL/uL (ref 4.22–5.81)
RDW: 13.6 % (ref 11.5–15.5)
WBC: 6.9 K/uL (ref 4.0–10.5)
nRBC: 0 % (ref 0.0–0.2)

## 2024-04-06 LAB — TROPONIN I (HIGH SENSITIVITY)
Troponin I (High Sensitivity): 9 ng/L (ref <18)
Troponin I (High Sensitivity): 11 ng/L (ref <18)

## 2024-04-06 LAB — COMPREHENSIVE METABOLIC PANEL WITH GFR
ALT: 25 U/L (ref 0–44)
AST: 27 U/L (ref 15–41)
Albumin: 3.3 g/dL — ABNORMAL LOW (ref 3.5–5.0)
Alkaline Phosphatase: 71 U/L (ref 38–126)
Anion gap: 10 (ref 5–15)
BUN: 20 mg/dL (ref 8–23)
CO2: 21 mmol/L — ABNORMAL LOW (ref 22–32)
Calcium: 8.8 mg/dL — ABNORMAL LOW (ref 8.9–10.3)
Chloride: 107 mmol/L (ref 98–111)
Creatinine, Ser: 1.63 mg/dL — ABNORMAL HIGH (ref 0.61–1.24)
GFR, Estimated: 40 mL/min — ABNORMAL LOW (ref >60)
Glucose, Bld: 122 mg/dL — ABNORMAL HIGH (ref 70–99)
Potassium: 3.9 mmol/L (ref 3.5–5.1)
Sodium: 138 mmol/L (ref 135–145)
Total Bilirubin: 0.9 mg/dL (ref 0.0–1.2)
Total Protein: 7 g/dL (ref 6.5–8.1)

## 2024-04-06 LAB — URINALYSIS, W/ REFLEX TO CULTURE (INFECTION SUSPECTED)
Bilirubin Urine: NEGATIVE
Glucose, UA: NEGATIVE mg/dL
Ketones, ur: NEGATIVE mg/dL
Leukocytes,Ua: NEGATIVE
Nitrite: NEGATIVE
Protein, ur: NEGATIVE mg/dL
Specific Gravity, Urine: 1.028 (ref 1.005–1.030)
pH: 5 (ref 5.0–8.0)

## 2024-04-06 LAB — I-STAT CG4 LACTIC ACID, ED
Lactic Acid, Venous: 3 mmol/L (ref 0.5–1.9)
Lactic Acid, Venous: 3.1 mmol/L (ref 0.5–1.9)
Lactic Acid, Venous: 1.2 mmol/L (ref 0.5–1.9)

## 2024-04-06 LAB — CBG MONITORING, ED: Glucose-Capillary: 118 mg/dL — ABNORMAL HIGH (ref 70–99)

## 2024-04-06 LAB — PROTIME-INR
INR: 1.1 (ref 0.8–1.2)
Prothrombin Time: 14.3 s (ref 11.4–15.2)

## 2024-04-06 LAB — BRAIN NATRIURETIC PEPTIDE: B Natriuretic Peptide: 33.6 pg/mL (ref 0.0–100.0)

## 2024-04-06 LAB — APTT: aPTT: 24 s (ref 24–36)

## 2024-04-06 MED ORDER — PIPERACILLIN-TAZOBACTAM 3.375 G IVPB 30 MIN
3.3750 g | Freq: Once | INTRAVENOUS | Status: AC
  Administered 2024-04-06: 3.375 g via INTRAVENOUS
  Filled 2024-04-06: qty 50

## 2024-04-06 MED ORDER — INSULIN ASPART 100 UNIT/ML IJ SOLN
0.0000 [IU] | Freq: Three times a day (TID) | INTRAMUSCULAR | Status: DC
  Administered 2024-04-07: 2 [IU] via SUBCUTANEOUS
  Administered 2024-04-08: 3 [IU] via SUBCUTANEOUS
  Administered 2024-04-09: 2 [IU] via SUBCUTANEOUS
  Administered 2024-04-10 – 2024-04-11 (×3): 3 [IU] via SUBCUTANEOUS
  Administered 2024-04-12 (×3): 2 [IU] via SUBCUTANEOUS
  Administered 2024-04-13: 3 [IU] via SUBCUTANEOUS
  Administered 2024-04-13: 2 [IU] via SUBCUTANEOUS
  Administered 2024-04-14: 3 [IU] via SUBCUTANEOUS
  Administered 2024-04-14 – 2024-04-15 (×2): 2 [IU] via SUBCUTANEOUS
  Filled 2024-04-06 (×4): qty 3
  Filled 2024-04-06: qty 2
  Filled 2024-04-06: qty 3
  Filled 2024-04-06 (×2): qty 2
  Filled 2024-04-06: qty 3
  Filled 2024-04-06: qty 2

## 2024-04-06 MED ORDER — POLYETHYLENE GLYCOL 3350 17 G PO PACK
17.0000 g | PACK | Freq: Every day | ORAL | Status: DC | PRN
Start: 2024-04-06 — End: 2024-04-16
  Administered 2024-04-14 – 2024-04-15 (×2): 17 g via ORAL
  Filled 2024-04-06 (×2): qty 1

## 2024-04-06 MED ORDER — NOREPINEPHRINE 4 MG/250ML-% IV SOLN
0.0000 ug/min | INTRAVENOUS | Status: DC
  Administered 2024-04-06: 8 ug/min via INTRAVENOUS
  Filled 2024-04-06: qty 250

## 2024-04-06 MED ORDER — ATORVASTATIN CALCIUM 80 MG PO TABS
80.0000 mg | ORAL_TABLET | Freq: Every day | ORAL | Status: DC
  Administered 2024-04-06 – 2024-04-16 (×11): 80 mg via ORAL
  Filled 2024-04-06: qty 1
  Filled 2024-04-06: qty 2
  Filled 2024-04-06 (×9): qty 1

## 2024-04-06 MED ORDER — SERTRALINE HCL 50 MG PO TABS
50.0000 mg | ORAL_TABLET | Freq: Every day | ORAL | Status: DC
  Administered 2024-04-06 – 2024-04-15 (×10): 50 mg via ORAL
  Filled 2024-04-06 (×10): qty 1

## 2024-04-06 MED ORDER — ASPIRIN 81 MG PO TBEC
81.0000 mg | DELAYED_RELEASE_TABLET | Freq: Every day | ORAL | Status: DC
  Administered 2024-04-06 – 2024-04-07 (×2): 81 mg via ORAL
  Filled 2024-04-06 (×2): qty 1

## 2024-04-06 MED ORDER — CHLORHEXIDINE GLUCONATE CLOTH 2 % EX PADS
6.0000 | MEDICATED_PAD | Freq: Every day | CUTANEOUS | Status: DC
  Administered 2024-04-08 – 2024-04-15 (×7): 6 via TOPICAL

## 2024-04-06 MED ORDER — METHOCARBAMOL 500 MG PO TABS
500.0000 mg | ORAL_TABLET | Freq: Once | ORAL | Status: AC
  Administered 2024-04-06: 500 mg via ORAL
  Filled 2024-04-06: qty 1

## 2024-04-06 MED ORDER — ACETAMINOPHEN 325 MG PO TABS
650.0000 mg | ORAL_TABLET | Freq: Four times a day (QID) | ORAL | Status: DC | PRN
  Administered 2024-04-09 – 2024-04-12 (×4): 650 mg via ORAL
  Filled 2024-04-06 (×4): qty 2

## 2024-04-06 MED ORDER — VANCOMYCIN HCL 2000 MG/400ML IV SOLN
2000.0000 mg | Freq: Once | INTRAVENOUS | Status: AC
  Administered 2024-04-06: 2000 mg via INTRAVENOUS
  Filled 2024-04-06: qty 400

## 2024-04-06 MED ORDER — LACTATED RINGERS IV BOLUS
1000.0000 mL | Freq: Once | INTRAVENOUS | Status: AC
  Administered 2024-04-06: 1000 mL via INTRAVENOUS

## 2024-04-06 MED ORDER — SODIUM CHLORIDE 0.9 % IV BOLUS (SEPSIS)
500.0000 mL | Freq: Once | INTRAVENOUS | Status: AC
  Administered 2024-04-06: 500 mL via INTRAVENOUS

## 2024-04-06 MED ORDER — IOHEXOL 350 MG/ML SOLN
75.0000 mL | Freq: Once | INTRAVENOUS | Status: AC | PRN
  Administered 2024-04-06: 75 mL via INTRAVENOUS

## 2024-04-06 MED ORDER — SODIUM CHLORIDE 0.9 % IV SOLN
2.0000 g | INTRAVENOUS | Status: DC
  Administered 2024-04-06: 2 g via INTRAVENOUS
  Filled 2024-04-06: qty 20

## 2024-04-06 NOTE — H&P (Signed)
 NAME:  [name], MRN:  [id number], DOB:  [birth year], LOS: 0 ADMISSION DATE:  04/06/2024, CONSULTATION DATE:  04/06/24 REFERRING MD:  Dr. Lenor, CHIEF COMPLAINT:  fall   History of Present Illness:   89 yoM with PMH as below, significant for CAD, combined diastolic and systolic HF, CKD stage III, HTN, HLD, and DM.    Seen earlier this am at PCP pt states for ongoing left knee pain and shoulder pain reportedly diagnosed with UTI.  Went home and sustained a fall when trying to put clothes and leaned forward too far.  Not on antiplatelets or blood thinners.  Denies LOC or hitting head, c/o of left shoulder, hip and rib pain, found hypoxic on RA at 85% per PTAR placed on 2L.   Last took his BP meds this am.  Denies any fever, chills, CP, N/V, abd pain, urinary symptoms.  States has chronic SOB.  Pt states he has a full time caregiver.  Called and spoke with caregiver, [name].  Reports pt has had frequent UTI's recently and he knows this cause he will stop eating, has urinary incontinence, gets confused and hallucinates.  [place] he stopped eating last Thursday, has been hallucinating, reaching out for things not there, and just started to eat and take his medications this am.  He took him to his PCP to be checked out.  Did fall when they got home but otherwise, last fall was 3-4 months ago.  Also states pt will not be honest about symptoms as not to have to stay in hospital.  In ER, afebrile, but hypotensive 64/48, alert and oriented x 4.  BP not improved after NS 500 ml bolus and therefore started on peripheral NE.  Labs noted for Lactic 3> 3.1, trop 9> 11, BNP 33, sCr 1.63, albumin 3.3, glucose 122, normal CBC.  EKG no acute changes.  Given concern for sepsis/ UTI, cultures sent and empirically started on vanc, ceftriaxone , and zosyn .  CTH showing 2mm left frontal SDH without mass effect, CT chest/ abd/ pelvis and CT cervical without evidence of acute traumatic injury.  Currently pt having intermittent  pain in his left upper back/ posterior rib area worse with movement or deep breath.  Currently down to 2mcg NE.  Foley recently placed with UA neg for leuks or nitrates.  EDP consulting NSGY, PCCM called for admit.    Pertinent  Medical History   Past Medical History:  Diagnosis Date   Arthritis    all over (11/12/2017)   CAD (coronary artery disease) 11/11/2017   S/p IL STEMI 6/19:  S/p DES to pLCx and DES x 2 to OM2 // staged PCI 6/19:  DES x 2 to RCA // residual CAD at cath 6/19:  mLAD 80, dLAD 90, D1 95 tx medically // EF 40-45 by echo   Chronic combined systolic and diastolic CHF (congestive heart failure) (HCC)    ischemic CM // s/p MI in 11/2017 >> Echo 6/19:  Moderate concentric LVH, EF 40-45, grade 1 diastolic dysfunction, inferior, inferolateral, apical inferior akinesis    Chronic lower back pain    CKD (chronic kidney disease), stage III (HCC)    Constipation    takes an OTC med as needed   Diabetic nephropathy (HCC)    Gastric ulcer 1970s   Glaucoma, both eyes    uses eye drops daily   Gout    takes Allopurinol  daily   History of colon polyps    History of ST elevation myocardial infarction (  STEMI) 11/10/2017   PCI to LCx and staged PCI to RCA   History of stress test    a. 09/2014 MV: EF 53%, no ischemia/infarct.   Hyperlipidemia    takes Pravastatin  daily   Hypertension    takes Amlodipine  and Losartan  daily   Insomnia    uses OTC sleep aide   Joint pain    Joint swelling    right knee    Legally blind    Macular degeneration    Morbid obesity (HCC)    Nocturia    Peripheral edema    Pleurisy 20+yrs ago   Pneumonia    I think once when I was a kid (11/12/2017)   Sleep apnea    Stroke (HCC)    I've had them; don't know anything about them; denies residual on 11/12/2017)   Syncope    a. 09/2014 in setting of orthostasis; b. 10/2014 Event monitor: 19 beat run of asymp NSVT; c. 01/2015 Echo: EF 60-65%, no rwma.   Type II diabetes mellitus (HCC)    takes  Metformin  daily   Urinary frequency    takes Flomax  daily   Significant Hospital Events: Including procedures, antibiotic start and stop dates in addition to other pertinent events   11/3 admit  Interim History / Subjective:  After receiving 1.5 L of IV fluid, patient's blood pressure improved with MAP in 90s, remain off vasopressor support, continue to complain of left shoulder pain  Objective    Blood pressure 106/78, pulse 83, temperature 97.6 F (36.4 C), temperature source Oral, resp. rate 17, height 5' 10 (1.778 m), SpO2 90%.       No intake or output data in the 24 hours ending 04/06/24 1418 There were no vitals filed for this visit.  Examination: General: Chronically ill-appearing elderly obese male, lying on the bed HEENT: [place]/AT, eyes anicteric.  moist mucus membranes Neuro: Alert, awake following commands Chest: Coarse breath sounds, no wheezes or rhonchi Heart: Regular rate and rhythm, no murmurs or gallops Abdomen: Soft, nontender, nondistended, bowel sounds present Musculoskeletal: Slight abrasion noted on left shoulder  Labs and images reviewed  Patient Lines/Drains/Airways Status     Active Line/Drains/Airways     Name Placement date Placement time Site Days   Peripheral IV 04/06/24 20 G Anterior;Distal;Left;Upper Arm 04/06/24  1153  Arm  less than 1   Peripheral IV 04/06/24 20 G Anterior;Right Hand 04/06/24  1139  Hand  less than 1   Urethral Catheter Mariel RN Latex 16 Fr. 04/06/24  1447  Latex  less than 1         Resolved problem list   Assessment and Plan  Hypotension, likely due to dehydration Lactic acidosis  Patient has not been eating or drinking well, he is on antihypertensive as well as Lasix  No clear source of infection Lactate was elevated to 3, repeat 1 is pending UA is negative for UTI, CT abdomen chest and pelvis is negative for acute findings Received 1.5 L of IV fluid with improvement in blood pressure, now off vasopressors and  MAP is in 90s  Status post mechanical fall Minimal subacute left frontal SDH CT cspine neg Appreciate neurosurgery consult Recommend repeating head CT in 8 hours No need for Keppra Patient remained asymptomatic  AKI on CKD stage IIIa Patient's baseline serum creatinine is between 1.1-1.2 Currently serum creatinine is 1.63 likely due to dehydration Repeat BMP in the morning He received 1.5 L of IV fluid, will hold off further IV fluid, encourage  oral fluid intake  Diabetes type 2 Continue sliding scale insulin  with CBG goal 140-180 Follow-up A1c  Chronic combined systolic and diastolic heart failure Patient's last echocardiogram was in 2019 showing EF 40 to 45% with grade 1 diastolic dysfunction Hold GDMT for now considering he presented with hypotension in the setting of not eating and drinking well Resume GDMT very slowly I do not think getting another echocardiogram will change any management at this point  Labs   CBC: Recent Labs  Lab 04/06/24 1130  WBC 6.9  NEUTROABS 4.8  HGB 13.5  HCT 41.4  MCV 89.2  PLT 203    Basic Metabolic Panel: Recent Labs  Lab 04/06/24 1130  NA 138  K 3.9  CL 107  CO2 21*  GLUCOSE 122*  BUN 20  CREATININE 1.63*  CALCIUM  8.8*   GFR: CrCl cannot be calculated (Unknown ideal weight.). Recent Labs  Lab 04/06/24 1130 04/06/24 1148  WBC 6.9  --   LATICACIDVEN  --  3.0*    Liver Function Tests: Recent Labs  Lab 04/06/24 1130  AST 27  ALT 25  ALKPHOS 71  BILITOT 0.9  PROT 7.0  ALBUMIN 3.3*   No results for input(s): LIPASE, AMYLASE in the last 168 hours. No results for input(s): AMMONIA in the last 168 hours.  ABG    Component Value Date/Time   PHART 7.355 09/07/2014 0136   PCO2ART 43.4 09/07/2014 0136   PO2ART 66.0 (L) 09/07/2014 0136   HCO3 24.3 (H) 09/07/2014 0136   TCO2 22 11/10/2017 1111   ACIDBASEDEF 1.0 09/07/2014 0136   O2SAT 92.0 09/07/2014 0136     Coagulation Profile: Recent Labs  Lab  04/06/24 1215  INR 1.1    Cardiac Enzymes: No results for input(s): CKTOTAL, CKMB, CKMBINDEX, TROPONINI in the last 168 hours.  HbA1C: Hgb A1c MFr Bld  Date/Time Value Ref Range Status  05/29/2018 03:43 PM 6.1 (H) 4.8 - 5.6 % Final    Comment:    (NOTE) Pre diabetes:          5.7%-6.4% Diabetes:              >6.4% Glycemic control for   <7.0% adults with diabetes   09/07/2014 01:29 AM 6.3 (H) 4.8 - 5.6 % Final    Comment:    (NOTE)         Pre-diabetes: 5.7 - 6.4         Diabetes: >6.4         Glycemic control for adults with diabetes: <7.0     CBG: Recent Labs  Lab 04/06/24 1119  GLUCAP 118*    Review of Systems:   12 point review of system is significant for complaint mentioned in HPI, rest is negative  Past Medical History:  He,  has a past medical history of Arthritis, CAD (coronary artery disease) (11/11/2017), Chronic combined systolic and diastolic CHF (congestive heart failure) (HCC), Chronic lower back pain, CKD (chronic kidney disease), stage III (HCC), Constipation, Diabetic nephropathy (HCC), Gastric ulcer (1970s), Glaucoma, both eyes, Gout, History of colon polyps, History of ST elevation myocardial infarction (STEMI) (11/10/2017), History of stress test, Hyperlipidemia, Hypertension, Insomnia, Joint pain, Joint swelling, Legally blind, Macular degeneration, Morbid obesity (HCC), Nocturia, Peripheral edema, Pleurisy (20+yrs ago), Pneumonia, Sleep apnea, Stroke (HCC), Syncope, Type II diabetes mellitus (HCC), and Urinary frequency.   Surgical History:   Past Surgical History:  Procedure Laterality Date   BACK SURGERY     CATARACT EXTRACTION W/ INTRAOCULAR LENS  IMPLANT, BILATERAL Bilateral    COLONOSCOPY     CORONARY ANGIOGRAPHY N/A 11/12/2017   Procedure: CORONARY ANGIOGRAPHY;  Surgeon: Burnard Debby LABOR, MD;  Location: [facility];  Service: Cardiovascular;  Laterality: N/A;   CORONARY STENT INTERVENTION N/A 11/12/2017   Procedure: CORONARY  STENT INTERVENTION;  Surgeon: Burnard Debby LABOR, MD;  Location: [facility];  Service: Cardiovascular;  Laterality: N/A;   CORONARY/GRAFT ACUTE MI REVASCULARIZATION N/A 11/10/2017   Procedure: Coronary/Graft Acute MI Revascularization;  Surgeon: Burnard Debby LABOR, MD;  Location: [facility];  Service: Cardiovascular;  Laterality: N/A;   EYE SURGERY     GLAUCOMA SURGERY Bilateral    INSERTION / PLACEMENT / REVISION NEUROSTIMULATOR  2000s   KNEE ARTHROSCOPY Right 1990s   LEFT HEART CATH AND CORONARY ANGIOGRAPHY N/A 11/10/2017   Procedure: LEFT HEART CATH AND CORONARY ANGIOGRAPHY;  Surgeon: Burnard Debby LABOR, MD;  Location: [facility];  Service: Cardiovascular;  Laterality: N/A;   MINI SHUNT INSERTION  09/26/2011   Procedure: INSERTION OF MINI SHUNT;  Surgeon: Gaither Quan, MD;  Location: [facility] OR;  Service: Ophthalmology;  Laterality: Right;  Insertion of Ahmed shunt   POSTERIOR FUSION LUMBAR SPINE  1990s   REVERSE SHOULDER ARTHROPLASTY Right 09/21/2013   Procedure: RIGHT  SHOULDER ARTHROPLASTY REVERSE ;  Surgeon: Elspeth JONELLE Her, MD;  Location: [facility] OR;  Service: Orthopedics;  Laterality: Right;   SHOULDER INJECTION Left 09/21/2013   Procedure: SHOULDER INJECTION;  Surgeon: Elspeth JONELLE Her, MD;  Location[facility] OR;  Service: Orthopedics;  Laterality: Left;     Social History:   reports that he has quit smoking. His smoking use included cigarettes. He has never used smokeless tobacco. He reports that he does not currently use alcohol . He reports that he does not use drugs.   Family History:  His family history includes Alzheimer's disease in his maternal aunt; Cancer in his mother. There is no history of Anesthesia problems.   Allergies No Known Allergies   Home Medications  Prior to Admission medications   Medication Sig Start Date End Date Taking? Authorizing Provider  acetaminophen  (TYLENOL ) 325 MG tablet Take 2 tablets (650 mg total) by mouth every 6 (six) hours. 02/12/18   Elgergawy,  Brayton RAMAN, MD  amLODipine  (NORVASC ) 5 MG tablet Take 1 tablet (5 mg total) by mouth daily. 09/11/19   Croitoru, Mihai, MD  aspirin  EC 81 MG EC tablet Take 1 tablet (81 mg total) by mouth daily. 11/19/17   Barrett, Shona MATSU, PA-C  atorvastatin  (LIPITOR ) 80 MG tablet Take 1 tablet (80 mg total) by mouth daily. 05/16/20   Croitoru, Mihai, MD  BELSOMRA 10 MG TABS Take 1 tablet by mouth at bedtime as needed. 01/09/22   [provider]  benzonatate (TESSALON) 100 MG capsule Take 100 mg by mouth 3 (three) times daily as needed. 08/14/22   [provider]  cephALEXin  (KEFLEX ) 500 MG capsule Take 1 capsule (500 mg total) by mouth 4 (four) times daily. 12/26/23   Myriam Dorn BROCKS, PA  furosemide  (LASIX ) 20 MG tablet Take 20 mg by mouth daily. 06/04/22   [provider]  furosemide  (LASIX ) 40 MG tablet TAKE ONE TABLET BY MOUTH ONCE DAILY 08/14/22   Croitoru, Jerel, MD  insulin  aspart (NOVOLOG ) 100 UNIT/ML injection Inject 0-9 Units into the skin 3 (three) times daily with meals. 02/12/18   Elgergawy, Brayton RAMAN, MD  Menthol -Camphor 16-11 % CREA Apply 1 application topically as needed (knee pain).  [provider]  metoprolol  succinate (TOPROL -XL) 25 MG 24 hr tablet TAKE 1 TABLET BY MOUTH EVERY DAY 08/14/19   Croitoru, Mihai, MD  mirabegron  ER (MYRBETRIQ ) 25 MG TB24 tablet Take 1 tablet (25 mg total) by mouth daily. 08/28/22   Stoneking, Adine PARAS., MD  nitroGLYCERIN  (NITROSTAT ) 0.4 MG SL tablet Dissolve 1 tab under tongue as needed for chest pain. May repeat every 5 minutes x 2 more doses. If no relief call 9-1-1. 12/28/20   Croitoru, Jerel, MD  prednisoLONE  acetate (PRED FORTE ) 1 % ophthalmic suspension Place 1 drop into the right eye at bedtime. 11/29/17   Medina-Vargas, Monina C, NP  PSYLLIUM PO Take 1 packet by mouth daily.    [provider]  sertraline  (ZOLOFT ) 50 MG tablet Take 1 tablet (50 mg total) by mouth at bedtime. 11/29/17   Medina-Vargas, Monina C, NP  timolol   (TIMOPTIC ) 0.5 % ophthalmic solution Place 1 drop into both eyes daily. 06/04/22   [provider]  TRAVATAN  Z 0.004 % SOLN ophthalmic solution Place 1 drop into the left eye at bedtime. 11/29/17   Medina-Vargas, Monina C, NP       Valinda Novas, MD [facility] See Amion for pager If no response to pager, please call [phone] until 7pm After 7pm, Please call E-link [phone]

## 2024-04-06 NOTE — ED Triage Notes (Signed)
 Pt bib PTAR coming from home. Pt went to doctor's office this morning and when family member tried to help patient get clothes on when he got home, he fell on floor. No LOC/head injury. Pt c/o left sided shoulder, hip, and ribcage pain. Pt room air SpO2 was 85%, PTAR put pt on 2L.   EMS VS: 108/60 76 HR 16 RR 95% 2L 126 cbg

## 2024-04-06 NOTE — ED Notes (Signed)
 EDP notified of patient on max ordered dose of levophed.

## 2024-04-06 NOTE — ED Notes (Signed)
 EDP made notified of low blood pressure and maps.

## 2024-04-06 NOTE — ED Provider Notes (Signed)
 [place] [facility] Provider Note   CSN: [id number] Arrival date & time: 04/06/24  1053     Patient presents with: [facility] [name] is a 89 y.o. male with past medical history of heart failure with reduced ejection fraction, CAD with previous STEMI, stage III kidney disease and type 2 diabetes mellitus presenting to the emergency department after a fall.  Per EMS patient was seen as a doctors this morning where he was diagnosed with a urinary tract infection.  At home, his caregiver was helping him get close on when he fell.  No LOC.  Patient complains of left  rib and hip pain.  Patient was hypoxic to 85% on room air with EMS and placed on 2 L.  On arrival patient was found to be hypotensive with blood pressure of 64/48.  Patient GCS 15, awake, alert, and oriented.    Fall Pertinent negatives include no abdominal pain.       Prior to Admission medications   Medication Sig Start Date End Date Taking? Authorizing Provider  acetaminophen  (TYLENOL ) 325 MG tablet Take 2 tablets (650 mg total) by mouth every 6 (six) hours. 02/12/18   Elgergawy, Brayton RAMAN, MD  amLODipine  (NORVASC ) 5 MG tablet Take 1 tablet (5 mg total) by mouth daily. 09/11/19   Croitoru, Mihai, MD  aspirin  EC 81 MG EC tablet Take 1 tablet (81 mg total) by mouth daily. 11/19/17   Barrett, Shona MATSU, PA-C  atorvastatin  (LIPITOR ) 80 MG tablet Take 1 tablet (80 mg total) by mouth daily. 05/16/20   Croitoru, Mihai, MD  BELSOMRA 10 MG TABS Take 1 tablet by mouth at bedtime as needed. 01/09/22   [provider]  benzonatate (TESSALON) 100 MG capsule Take 100 mg by mouth 3 (three) times daily as needed. 08/14/22   [provider]  cephALEXin  (KEFLEX ) 500 MG capsule Take 1 capsule (500 mg total) by mouth 4 (four) times daily. 12/26/23   Myriam Dorn BROCKS, PA  furosemide  (LASIX ) 20 MG tablet Take 20 mg by mouth daily. 06/04/22   [provider]  furosemide  (LASIX ) 40 MG  tablet TAKE ONE TABLET BY MOUTH ONCE DAILY 08/14/22   Croitoru, Jerel, MD  insulin  aspart (NOVOLOG ) 100 UNIT/ML injection Inject 0-9 Units into the skin 3 (three) times daily with meals. 02/12/18   Elgergawy, Brayton RAMAN, MD  Menthol -Camphor 16-11 % CREA Apply 1 application topically as needed (knee pain).    [provider]  metoprolol  succinate (TOPROL -XL) 25 MG 24 hr tablet TAKE 1 TABLET BY MOUTH EVERY DAY 08/14/19   Croitoru, Mihai, MD  mirabegron  ER (MYRBETRIQ ) 25 MG TB24 tablet Take 1 tablet (25 mg total) by mouth daily. 08/28/22   Stoneking, Adine PARAS., MD  nitroGLYCERIN  (NITROSTAT ) 0.4 MG SL tablet Dissolve 1 tab under tongue as needed for chest pain. May repeat every 5 minutes x 2 more doses. If no relief call 9-1-1. 12/28/20   Croitoru, Jerel, MD  prednisoLONE  acetate (PRED FORTE ) 1 % ophthalmic suspension Place 1 drop into the right eye at bedtime. 11/29/17   Medina-Vargas, Monina C, NP  PSYLLIUM PO Take 1 packet by mouth daily.    [provider]  sertraline  (ZOLOFT ) 50 MG tablet Take 1 tablet (50 mg total) by mouth at bedtime. 11/29/17   Medina-Vargas, Monina C, NP  timolol  (TIMOPTIC ) 0.5 % ophthalmic solution Place 1 drop into both eyes daily. 06/04/22   [provider]  TRAVATAN  Z 0.004 % SOLN  ophthalmic solution Place 1 drop into the left eye at bedtime. 11/29/17   Medina-Vargas, Monina C, NP    Allergies: Patient has no known allergies.    Review of Systems  Gastrointestinal:  Negative for abdominal pain.  Musculoskeletal:        L sided rib pain    Updated Vital Signs BP 111/80   Pulse 88   Temp 98.1 F (36.7 C) (Oral)   Resp 19   Ht 5' 10 (1.778 m)   SpO2 95%   BMI 26.54 kg/m   Physical Exam Vitals and nursing note reviewed.  Constitutional:      General: He is not in acute distress.    Appearance: He is well-developed.  HENT:     Head: Normocephalic and atraumatic.  Eyes:     Extraocular Movements: Extraocular movements intact.     Comments:  Right eye with chronic opacification, left eye PERRL  Cardiovascular:     Rate and Rhythm: Normal rate and regular rhythm.     Heart sounds: No murmur heard. Pulmonary:     Effort: Pulmonary effort is normal. No respiratory distress.     Breath sounds: Rhonchi present.  Chest:     Chest wall: Tenderness (Left side rib pain) present.  Abdominal:     Palpations: Abdomen is soft.     Tenderness: There is no abdominal tenderness.  Musculoskeletal:        General: No signs of injury.     Cervical back: Neck supple.  Skin:    General: Skin is warm and dry.  Neurological:     General: No focal deficit present.     Mental Status: He is alert and oriented to person, place, and time.     (all labs ordered are listed, but only abnormal results are displayed) Labs Reviewed  COMPREHENSIVE METABOLIC PANEL WITH GFR - Abnormal; Notable for the following components:      Result Value   CO2 21 (*)    Glucose, Bld 122 (*)    Creatinine, Ser 1.63 (*)    Calcium  8.8 (*)    Albumin 3.3 (*)    GFR, Estimated 40 (*)    All other components within normal limits  CBC WITH DIFFERENTIAL/PLATELET - Abnormal; Notable for the following components:   Abs Immature Granulocytes 0.12 (*)    All other components within normal limits  I-STAT CG4 LACTIC ACID, ED - Abnormal; Notable for the following components:   Lactic Acid, Venous 3.0 (*)    All other components within normal limits  CBG MONITORING, ED - Abnormal; Notable for the following components:   Glucose-Capillary 118 (*)    All other components within normal limits  I-STAT CG4 LACTIC ACID, ED - Abnormal; Notable for the following components:   Lactic Acid, Venous 3.1 (*)    All other components within normal limits  CULTURE, BLOOD (ROUTINE X 2)  CULTURE, BLOOD (ROUTINE X 2)  URINE CULTURE  BRAIN NATRIURETIC PEPTIDE  PROTIME-INR  APTT  URINALYSIS, W/ REFLEX TO CULTURE (INFECTION SUSPECTED)  LACTIC ACID, PLASMA  I-STAT CG4 LACTIC ACID, ED   I-STAT CG4 LACTIC ACID, ED  TROPONIN I (HIGH SENSITIVITY)  TROPONIN I (HIGH SENSITIVITY)    EKG: EKG Interpretation Date/Time:  Monday April 06 2024 12:01:38 EST Ventricular Rate:  70 PR Interval:  302 QRS Duration:  155 QT Interval:  457 QTC Calculation: 494 R Axis:   -73  Text Interpretation: Sinus rhythm Prolonged PR interval RBBB and LAFB Probable left ventricular  hypertrophy since last tracing no significant change Confirmed by Lenor Hollering [phone]) on 04/06/2024 12:19:43 PM  Radiology: CT CHEST ABDOMEN PELVIS W CONTRAST Result Date: 04/06/2024 EXAM: CT CHEST, ABDOMEN AND PELVIS WITH CONTRAST 04/06/2024 01:43:40 PM TECHNIQUE: CT of the chest, abdomen and pelvis was performed with the administration of 75 mL of iohexol  (OMNIPAQUE ) 350 MG/ML injection. Multiplanar reformatted images are provided for review. Automated exposure control, iterative reconstruction, and/or weight based adjustment of the mA/kV was utilized to reduce the radiation dose to as low as reasonably achievable. COMPARISON: None available. CLINICAL HISTORY: Polytrauma, blunt. History he fell on floor. No LOC/head injury. Pt c/o left sided shoulder, hip, and ribcage pain. FINDINGS: CHEST: MEDIASTINUM AND LYMPH NODES: Heart and pericardium are unremarkable. The central airways are clear. No mediastinal, hilar or axillary lymphadenopathy. No aortic injury. LUNGS AND PLEURA: Suboptimal degradation of the thoracic imaging somewhat limits rib evaluation but no acute fracture identified. No pneumothorax or pulmonary contusion. No pleural effusion. ABDOMEN AND PELVIS: LIVER: The liver is unremarkable. GALLBLADDER AND BILE DUCTS: Gallbladder is unremarkable. No biliary ductal dilatation. SPLEEN: No acute abnormality. PANCREAS: No acute abnormality. ADRENAL GLANDS: No acute abnormality. KIDNEYS, URETERS AND BLADDER: Large simple fluid attenuation cyst of the left kidney on post-contrast imaging measures 5.3 cm. Similar smaller  fluid density cysts in the right kidney measures 2.7 cm. Per consensus, no follow-up is needed for simple Bosniak type 1 and 2 renal cysts, unless the patient has a malignancy history or risk factors. No stones in the kidneys or ureters. No hydronephrosis. No perinephric or periureteral stranding. Urinary bladder is unremarkable. GI AND BOWEL: Stomach demonstrates no acute abnormality. There is no bowel obstruction. REPRODUCTIVE ORGANS: No acute abnormality. PERITONEUM AND RETROPERITONEUM: No ascites. No free air. VASCULATURE: Aorta is normal in caliber. ABDOMINAL AND PELVIS LYMPH NODES: No lymphadenopathy. REPRODUCTIVE ORGANS: No acute abnormality. BONES AND SOFT TISSUES: Suboptimal degradation of the thoracic imaging somewhat limits rib evaluation but no acute fracture identified. Pakind fracture identified. Spinal cord stimulator device noted in the mid thoracic spine. Degenerative changes of the lumbar spine most severe at L1-L2. No pelvic fracture or hip fracture. No focal soft tissue abnormality. IMPRESSION: 1. No acute traumatic injury identified in the chest, abdomen, or pelvis. 2. Suboptimal thoracic imaging limits rib evaluation; no acute rib fracture identified. Electronically signed by: Norleen Boxer MD 04/06/2024 02:52 PM EST RP Workstation: [id number]   CT Head Wo Contrast Result Date: 04/06/2024 EXAM: CT HEAD WITHOUT CONTRAST 04/06/2024 01:43:40 PM TECHNIQUE: CT of the head was performed without the administration of intravenous contrast. Automated exposure control, iterative reconstruction, and/or weight based adjustment of the mA/kV was utilized to reduce the radiation dose to as low as reasonably achievable. COMPARISON: CT head 09/20/2021. CLINICAL HISTORY: Head trauma, minor (Age >= 65y). FINDINGS: BRAIN AND VENTRICLES: A new thin hyperdense subdural hematoma over the left lateral frontal convexity measures up to 2 mm in thickness without associated mass effect. No acute infarct, mass, or  hydrocephalus is evident. There is moderate cerebral atrophy. Patchy cerebral white matter hypodensities are stable to mildly increased in nonspecific but compatible with mild chronic small vessel ischemic disease. Chronic lacunar infarcts are noted in the basal ganglia. Calcified atherosclerosis at the skull base. ORBITS: Bilateral cataract extraction. SINUSES: No acute abnormality. SOFT TISSUES AND SKULL: No acute soft tissue abnormality. No skull fracture. Emergent findings were communicated to Dr. Sharlet by telephone on 04/06/2024 at 2:39 pm. IMPRESSION: 1. Thin subdural hematoma over the left frontal convexity without mass effect.  2. Mild chronic small vessel ischemic disease. Electronically signed by: Dasie Hamburg MD 04/06/2024 02:46 PM EST RP Workstation: [id number]   CT Cervical Spine Wo Contrast Result Date: 04/06/2024 EXAM: CT CERVICAL SPINE WITHOUT CONTRAST 04/06/2024 01:43:40 PM TECHNIQUE: CT of the cervical spine was performed without the administration of intravenous contrast. Multiplanar reformatted images are provided for review. Automated exposure control, iterative reconstruction, and/or weight based adjustment of the mA/kV was utilized to reduce the radiation dose to as low as reasonably achievable. COMPARISON: CT cervical spine 09/20/2021 CLINICAL HISTORY: FINDINGS: CERVICAL SPINE: BONES AND ALIGNMENT: Chronic straightening of the normal cervical lordosis and trace anterolisthesis of C4 on C5. No acute fracture or suspicious lesion. Unchanged chronic fracture or developmental incomplete fusion of the posterior C1 ring on the right. DEGENERATIVE CHANGES: Widespread advanced cervical disc degeneration. Asymmetrically advanced facet arthrosis on the left at C2-C3 and on the right at C4-C5. No evidence of high grade spinal canal stenosis. SOFT TISSUES: No prevertebral soft tissue swelling. IMPRESSION: 1. No acute cervical spine fracture or traumatic malalignment. Electronically signed by: Dasie Hamburg MD 04/06/2024 02:34 PM EST RP Workstation: [id number]   DG Chest Port 1 View if patient is in a treatment room. Result Date: 04/06/2024 EXAM: 1 VIEW(S) XRAY OF THE CHEST 04/06/2024 11:41:00 AM COMPARISON: 01/04/2023 CLINICAL HISTORY: Suspected Sepsis FINDINGS: LUNGS AND PLEURA: Low lung volumes. Diffuse interstitial opacities, similar to previous exam which is favored to represent chronic interstitial lung disease. No pulmonary edema. No pleural effusion. No pneumothorax. HEART AND MEDIASTINUM: Spinal cord stimulator noted. No acute abnormality of the cardiac and mediastinal silhouettes. BONES AND SOFT TISSUES: Right shoulder arthroplasty noted. No acute osseous abnormality. IMPRESSION: 1. No acute cardiopulmonary process identified. Electronically signed by: Waddell Calk MD 04/06/2024 01:15 PM EST RP Workstation: [id number]     Procedures   Medications Ordered in the ED  cefTRIAXone  (ROCEPHIN ) 2 g in sodium chloride  0.9 % 100 mL IVPB (0 g Intravenous Stopped 04/06/24 1231)  norepinephrine (LEVOPHED) 4mg  in (0.016 mg/mL) premix infusion (1 mcg/min Intravenous Rate/Dose Change 04/06/24 1515)  vancomycin  (VANCOREADY) IVPB 2000 mg/400 mL (2,000 mg Intravenous New Bag/Given 04/06/24 1415)  piperacillin -tazobactam (ZOSYN ) IVPB 3.375 g (has no administration in time range)  lactated ringers  bolus 1,000 mL (has no administration in time range)  acetaminophen  (TYLENOL ) tablet 650 mg (has no administration in time range)  methocarbamol (ROBAXIN) tablet 500 mg (has no administration in time range)  sodium chloride  0.9 % bolus 500 mL (0 mLs Intravenous Stopped 04/06/24 1247)  iohexol  (OMNIPAQUE ) 350 MG/ML injection 75 mL (75 mLs Intravenous Contrast Given 04/06/24 1337)    Clinical Course as of 04/06/24 1519  Mon Apr 06, 2024  1507 W. Complicated PMHx includig hfref. Fall, -thiners. Dx with UTI this am. Undifferentiated shock given persistent hypotension --> now on levo.  SDH 2mm no shift.  Critial care to admit him. NSGY consulted for SDH.  [SA]    Clinical Course User Index [SA] Billy Pal, MD                                 Medical Decision Making Patient is a 89 year old male brought in by EMS after a ground-level fall.  Patient is not on blood thinners.  Patient was reportedly seen at his primary care physician and diagnosed with a UTI this morning.  Patient was found to be hypotensive on arrival.  On exam patient has left-sided rib tenderness, point-of-care  echo reveals EF of approximately 30% with plethoric IVC.  Patient remained GCS 15 and was mentating appropriately.  Neuroexam without focal neurodeficits.  Differential diagnosis includes but limited to sepsis, cardiogenic shock, ICH, C-spine injury, fracture, intra-abdominal injury  Given patient's infection, history of heart failure, and recent trauma, initial differential is broad.  Given his reported recent urinary tract infection, sepsis  workup was initiated.  Because of his significant heart failure, patient given 500 cc IV fluid resuscitation about improvement in his blood pressure.  Levophed was initiated.  Initially patient given Rocephin  for treatment of UTI however because of his persistent hypotension, antibiotics were broadened to vancomycin  and Zosyn .  Patient's blood pressure improved after initiation of Levophed up to 10 mics per minute  Labs with elevated lactic acid of 3.4, no leukocytosis or significant anemia.  No significant electrolyte abnormalities, creatinine elevated from baseline consistent with AKI.  Urine studies are pending.  Chest x-ray with interstitial opacity consistent with previous likely chronic interstitial lung disease, no acute consolidation or pneumothorax  Because patient did experience ground-level fall, CT head, C-spine, chest abdomen pelvis were ordered.  CT head revealed 2 mm subdural hematoma, and neurosurgery was consulted with recommendations pending.  CT C-spine and chest  abdomen pelvis without evidence  of traumatic injury.  Because of patient's hypotension requiring vasopressor support, critical care team was consulted early in patient's course.  Patient requires admission to the ICU for persistent hypotension on Levophed.  Please see the admitting team's note for the remainder of patient's care.    Amount and/or Complexity of Data Reviewed External Data Reviewed: notes. Labs: ordered. Radiology: ordered and independent interpretation performed. ECG/medicine tests: ordered and independent interpretation performed.  Risk Prescription drug management. Decision regarding hospitalization.        Final diagnoses:  Fall, initial encounter  Hypotension, unspecified hypotension type  SDH (subdural hematoma) [facility])    ED Discharge Orders     None          Sharlet Dowdy, MD 04/06/24 1519    Lenor Hollering, MD 04/07/24 [phone]

## 2024-04-06 NOTE — ED Notes (Signed)
 ED Provider at bedside.

## 2024-04-06 NOTE — Consult Note (Cosign Needed Addendum)
 Providing Compassionate, Quality Care - Together  Neurosurgery Consult  Referring physician: EDP Reason for referral: SDH   History of Present Illness: Pt w/ PMH significant for DM, HLD. Obesity, HTN, STEMI, CKD, CHF presented to [facility] ED following GLF. SDH noted on workup, NSGY consulted. Pt denies current HA. Not currently anticoagulated.   Physical Exam:  BP (!) 85/67   Pulse 84   Temp 98.1 F (36.7 C) (Oral)   Resp 17   Ht 5' 10 (1.778 m)   SpO2 94%   BMI 26.54 kg/m   CT CHEST ABDOMEN PELVIS W CONTRAST Result Date: 04/06/2024 EXAM: CT CHEST, ABDOMEN AND PELVIS WITH CONTRAST 04/06/2024 01:43:40 PM TECHNIQUE: CT of the chest, abdomen and pelvis was performed with the administration of 75 mL of iohexol  (OMNIPAQUE ) 350 MG/ML injection. Multiplanar reformatted images are provided for review. Automated exposure control, iterative reconstruction, and/or weight based adjustment of the mA/kV was utilized to reduce the radiation dose to as low as reasonably achievable. COMPARISON: None available. CLINICAL HISTORY: Polytrauma, blunt. History he fell on floor. No LOC/head injury. Pt c/o left sided shoulder, hip, and ribcage pain. FINDINGS: CHEST: MEDIASTINUM AND LYMPH NODES: Heart and pericardium are unremarkable. The central airways are clear. No mediastinal, hilar or axillary lymphadenopathy. No aortic injury. LUNGS AND PLEURA: Suboptimal degradation of the thoracic imaging somewhat limits rib evaluation but no acute fracture identified. No pneumothorax or pulmonary contusion. No pleural effusion. ABDOMEN AND PELVIS: LIVER: The liver is unremarkable. GALLBLADDER AND BILE DUCTS: Gallbladder is unremarkable. No biliary ductal dilatation. SPLEEN: No acute abnormality. PANCREAS: No acute abnormality. ADRENAL GLANDS: No acute abnormality. KIDNEYS, URETERS AND BLADDER: Large simple fluid attenuation cyst of the left kidney on post-contrast imaging measures 5.3 cm. Similar smaller fluid density cysts in  the right kidney measures 2.7 cm. Per consensus, no follow-up is needed for simple Bosniak type 1 and 2 renal cysts, unless the patient has a malignancy history or risk factors. No stones in the kidneys or ureters. No hydronephrosis. No perinephric or periureteral stranding. Urinary bladder is unremarkable. GI AND BOWEL: Stomach demonstrates no acute abnormality. There is no bowel obstruction. REPRODUCTIVE ORGANS: No acute abnormality. PERITONEUM AND RETROPERITONEUM: No ascites. No free air. VASCULATURE: Aorta is normal in caliber. ABDOMINAL AND PELVIS LYMPH NODES: No lymphadenopathy. REPRODUCTIVE ORGANS: No acute abnormality. BONES AND SOFT TISSUES: Suboptimal degradation of the thoracic imaging somewhat limits rib evaluation but no acute fracture identified. Pakind fracture identified. Spinal cord stimulator device noted in the mid thoracic spine. Degenerative changes of the lumbar spine most severe at L1-L2. No pelvic fracture or hip fracture. No focal soft tissue abnormality. IMPRESSION: 1. No acute traumatic injury identified in the chest, abdomen, or pelvis. 2. Suboptimal thoracic imaging limits rib evaluation; no acute rib fracture identified. Electronically signed by: Norleen Boxer MD 04/06/2024 02:52 PM EST RP Workstation: [id number]   CT Head Wo Contrast Result Date: 04/06/2024 EXAM: CT HEAD WITHOUT CONTRAST 04/06/2024 01:43:40 PM TECHNIQUE: CT of the head was performed without the administration of intravenous contrast. Automated exposure control, iterative reconstruction, and/or weight based adjustment of the mA/kV was utilized to reduce the radiation dose to as low as reasonably achievable. COMPARISON: CT head 09/20/2021. CLINICAL HISTORY: Head trauma, minor (Age >= 65y). FINDINGS: BRAIN AND VENTRICLES: A new thin hyperdense subdural hematoma over the left lateral frontal convexity measures up to 2 mm in thickness without associated mass effect. No acute infarct, mass, or hydrocephalus is evident.  There is moderate cerebral atrophy. Patchy cerebral white matter  hypodensities are stable to mildly increased in nonspecific but compatible with mild chronic small vessel ischemic disease. Chronic lacunar infarcts are noted in the basal ganglia. Calcified atherosclerosis at the skull base. ORBITS: Bilateral cataract extraction. SINUSES: No acute abnormality. SOFT TISSUES AND SKULL: No acute soft tissue abnormality. No skull fracture. Emergent findings were communicated to Dr. Sharlet by telephone on 04/06/2024 at 2:39 pm. IMPRESSION: 1. Thin subdural hematoma over the left frontal convexity without mass effect. 2. Mild chronic small vessel ischemic disease. Electronically signed by: Dasie Hamburg MD 04/06/2024 02:46 PM EST RP Workstation: [id number]   CT Cervical Spine Wo Contrast Result Date: 04/06/2024 EXAM: CT CERVICAL SPINE WITHOUT CONTRAST 04/06/2024 01:43:40 PM TECHNIQUE: CT of the cervical spine was performed without the administration of intravenous contrast. Multiplanar reformatted images are provided for review. Automated exposure control, iterative reconstruction, and/or weight based adjustment of the mA/kV was utilized to reduce the radiation dose to as low as reasonably achievable. COMPARISON: CT cervical spine 09/20/2021 CLINICAL HISTORY: FINDINGS: CERVICAL SPINE: BONES AND ALIGNMENT: Chronic straightening of the normal cervical lordosis and trace anterolisthesis of C4 on C5. No acute fracture or suspicious lesion. Unchanged chronic fracture or developmental incomplete fusion of the posterior C1 ring on the right. DEGENERATIVE CHANGES: Widespread advanced cervical disc degeneration. Asymmetrically advanced facet arthrosis on the left at C2-C3 and on the right at C4-C5. No evidence of high grade spinal canal stenosis. SOFT TISSUES: No prevertebral soft tissue swelling. IMPRESSION: 1. No acute cervical spine fracture or traumatic malalignment. Electronically signed by: Dasie Hamburg MD 04/06/2024 02:34 PM  EST RP Workstation: [id number]   DG Chest Port 1 View if patient is in a treatment room. Result Date: 04/06/2024 EXAM: 1 VIEW(S) XRAY OF THE CHEST 04/06/2024 11:41:00 AM COMPARISON: 01/04/2023 CLINICAL HISTORY: Suspected Sepsis FINDINGS: LUNGS AND PLEURA: Low lung volumes. Diffuse interstitial opacities, similar to previous exam which is favored to represent chronic interstitial lung disease. No pulmonary edema. No pleural effusion. No pneumothorax. HEART AND MEDIASTINUM: Spinal cord stimulator noted. No acute abnormality of the cardiac and mediastinal silhouettes. BONES AND SOFT TISSUES: Right shoulder arthroplasty noted. No acute osseous abnormality. IMPRESSION: 1. No acute cardiopulmonary process identified. Electronically signed by: Waddell Calk MD 04/06/2024 01:15 PM EST RP Workstation: [id number]   Awake, alert, oriented Speech fluent, appropriate CN grossly intact MAES Strength/sensation grossly intact   Impression/Assessment:  This is a 89yo male with small L SDH on CTH w/o MLS or mass effect  Plan:  -Repeat CTH 8H interval -Defer decision to give AEDs to primary team. Ok to hold given no seizure activity form NSGY standpoint.   Abrey Bradway CAYLIN [place]atham [place], PA-C

## 2024-04-06 NOTE — ED Notes (Signed)
 NP at [facility]. Levo recently titrated down from 2 to 1. BP remains strong. VSS. Pt resting sleeping. Arousable to voice. Alert, NAD, calm, interactive. Skin cool and dry.

## 2024-04-07 ENCOUNTER — Encounter (HOSPITAL_COMMUNITY): Payer: Self-pay | Admitting: Internal Medicine

## 2024-04-07 DIAGNOSIS — N1831 Chronic kidney disease, stage 3a: Secondary | ICD-10-CM

## 2024-04-07 DIAGNOSIS — I2510 Atherosclerotic heart disease of native coronary artery without angina pectoris: Secondary | ICD-10-CM | POA: Diagnosis not present

## 2024-04-07 DIAGNOSIS — E860 Dehydration: Secondary | ICD-10-CM

## 2024-04-07 DIAGNOSIS — I10 Essential (primary) hypertension: Secondary | ICD-10-CM

## 2024-04-07 DIAGNOSIS — S065XAA Traumatic subdural hemorrhage with loss of consciousness status unknown, initial encounter: Secondary | ICD-10-CM

## 2024-04-07 DIAGNOSIS — N390 Urinary tract infection, site not specified: Secondary | ICD-10-CM | POA: Diagnosis present

## 2024-04-07 DIAGNOSIS — E785 Hyperlipidemia, unspecified: Secondary | ICD-10-CM

## 2024-04-07 DIAGNOSIS — E669 Obesity, unspecified: Secondary | ICD-10-CM

## 2024-04-07 DIAGNOSIS — F32A Depression, unspecified: Secondary | ICD-10-CM

## 2024-04-07 DIAGNOSIS — E1169 Type 2 diabetes mellitus with other specified complication: Secondary | ICD-10-CM

## 2024-04-07 LAB — RENAL FUNCTION PANEL
Albumin: 3 g/dL — ABNORMAL LOW (ref 3.5–5.0)
Anion gap: 11 (ref 5–15)
BUN: 18 mg/dL (ref 8–23)
CO2: 23 mmol/L (ref 22–32)
Calcium: 8.4 mg/dL — ABNORMAL LOW (ref 8.9–10.3)
Chloride: 102 mmol/L (ref 98–111)
Creatinine, Ser: 1.53 mg/dL — ABNORMAL HIGH (ref 0.61–1.24)
GFR, Estimated: 43 mL/min — ABNORMAL LOW (ref >60)
Glucose, Bld: 122 mg/dL — ABNORMAL HIGH (ref 70–99)
Phosphorus: 3.7 mg/dL (ref 2.5–4.6)
Potassium: 4.3 mmol/L (ref 3.5–5.1)
Sodium: 136 mmol/L (ref 135–145)

## 2024-04-07 LAB — GLUCOSE, CAPILLARY
Glucose-Capillary: 132 mg/dL — ABNORMAL HIGH (ref 70–99)
Glucose-Capillary: 136 mg/dL — ABNORMAL HIGH (ref 70–99)

## 2024-04-07 LAB — CBG MONITORING, ED
Glucose-Capillary: 121 mg/dL — ABNORMAL HIGH (ref 70–99)
Glucose-Capillary: 114 mg/dL — ABNORMAL HIGH (ref 70–99)
Glucose-Capillary: 105 mg/dL — ABNORMAL HIGH (ref 70–99)

## 2024-04-07 LAB — CBC
HCT: 39.2 % (ref 39.0–52.0)
Hemoglobin: 12.7 g/dL — ABNORMAL LOW (ref 13.0–17.0)
MCH: 29 pg (ref 26.0–34.0)
MCHC: 32.4 g/dL (ref 30.0–36.0)
MCV: 89.5 fL (ref 80.0–100.0)
Platelets: 156 K/uL (ref 150–400)
RBC: 4.38 MIL/uL (ref 4.22–5.81)
RDW: 13.6 % (ref 11.5–15.5)
WBC: 8.5 K/uL (ref 4.0–10.5)
nRBC: 0 % (ref 0.0–0.2)

## 2024-04-07 LAB — MRSA NEXT GEN BY PCR, NASAL: MRSA by PCR Next Gen: NOT DETECTED

## 2024-04-07 LAB — URINE CULTURE: Culture: NO GROWTH

## 2024-04-07 LAB — HEMOGLOBIN A1C
Hgb A1c MFr Bld: 5.6 % (ref 4.8–5.6)
Mean Plasma Glucose: 114.02 mg/dL

## 2024-04-07 MED ORDER — OXYCODONE HCL 5 MG PO TABS
5.0000 mg | ORAL_TABLET | ORAL | Status: DC | PRN
  Administered 2024-04-07 – 2024-04-16 (×11): 5 mg via ORAL
  Filled 2024-04-07 (×12): qty 1

## 2024-04-07 MED ORDER — SODIUM CHLORIDE 0.9 % IV SOLN: INTRAVENOUS | Status: DC

## 2024-04-07 MED ORDER — DEXTROSE-SODIUM CHLORIDE 5-0.9 % IV SOLN: INTRAVENOUS | Status: DC

## 2024-04-07 MED ORDER — CYCLOBENZAPRINE HCL 10 MG PO TABS
5.0000 mg | ORAL_TABLET | Freq: Three times a day (TID) | ORAL | Status: DC | PRN
  Administered 2024-04-15: 5 mg via ORAL
  Filled 2024-04-07: qty 1

## 2024-04-07 MED ORDER — SODIUM CHLORIDE 0.9 % IV SOLN
1.0000 g | INTRAVENOUS | Status: DC
  Administered 2024-04-07 – 2024-04-08 (×2): 1 g via INTRAVENOUS
  Filled 2024-04-07 (×2): qty 10

## 2024-04-07 NOTE — Assessment & Plan Note (Signed)
 No signs of acute attack

## 2024-04-07 NOTE — Assessment & Plan Note (Signed)
 Continue glucose cover and monitoring with insulin  sliding scale Advance diet  Continue with statin therapy

## 2024-04-07 NOTE — Hospital Course (Addendum)
 [name] was admitted to the hospital with the working diagnosis of hypotension   89 yo male with the past medical history of coronary artery disease, heart failure, CKD and T2DM who presented after a mechanical fall. On the day of hospitalization he was diagnosed with urinary tract infection by his primary care provider. Apparently at home he had not being eating well, very poor oral intake, and having hallucinations. At home sustained a mechanical fall while trying to put is clothes, leaned forward to far. No head trauma, landing over his left side.  EMS was called, he was found with blood pressure 108/60, he was placed on 2 L/min per [place] with 02 saturation 95%, then transported to the ED.  On his initial physical examination in the ED, his blood pressure was 64/48, placed on peripheral norepinephrine with blood pressure 111/80, HR 83, RR 17 and 02 saturation 85% on room air  Lungs with no wheezing or rhonchi, heart with S1 and S2 present and regular with no gallops, abdomen with no distention, no lower extremity edema.   Na 138, K 3.9 Cl 107 bicarbonate 21 glucose 122 bun 20 cr 1,63  AST 27 ALT 25  High sensitive troponin 9  Lactic acid 3,0  Wbc 6.9 hgb 13.5 plt 203  Urine analysis SG 1,028, protein negative, hgb negative, leukocytes negative Wbc 0-5 and rbc 0-5   CT cervical spine with no acute fracture or traumatic malalignment.  CT chest abdomen and pelvis with no acute traumatic injury identified in the chest, abdomen or pelvis.  No acute rib fracture identified.  CT head with thin subdural hematoma over the left frontal convexity without mass effect.  Mild chronic small vessel ischemic disease.

## 2024-04-07 NOTE — ED Notes (Signed)
 Transport arrived as EMT-P was attempting to recheck temp and cbg.

## 2024-04-07 NOTE — TOC CAGE-AID Note (Signed)
 Transition of Care [facility]) - CAGE-AID Screening   Patient Details  Name: [name] MRN: [id number] Date of Birth: 04-18-1931  [facility]) CM/SW Contact:    LEBRON ROCKIE ORN, RN Phone Number: [phone] 04/07/2024, 5:03 PM   Clinical Narrative: Pt currently in the hospital after having a fall yesterday while at home. Pt is not currently on blood thinners. Pt did sustain a SDH and was admitted to medicine for other medical problems. Pt denies alcohol  or drug use. Screening complete.   CAGE-AID Screening:    Have You Ever Felt You Ought to Cut Down on Your Drinking or Drug Use?: No Have People Annoyed You By Critizing Your Drinking Or Drug Use?: No Have You Felt Bad Or Guilty About Your Drinking Or Drug Use?: No Have You Ever Had a Drink or Used Drugs First Thing In The Morning to Steady Your Nerves or to Get Rid of a Hangover?: No CAGE-AID Score: 0  Substance Abuse Education Offered: No

## 2024-04-07 NOTE — Assessment & Plan Note (Addendum)
 Continue to hold on antihypertensive medications, at home on amlodipine   Continue blood pressure monitoring

## 2024-04-07 NOTE — Assessment & Plan Note (Signed)
 Old cultures positive for klebsiella sensitive to ceftriaxone  Continue antibiotic therapy with IV ceftrtiaxone. Follow up on cultures.  Keep foley for now.

## 2024-04-07 NOTE — Assessment & Plan Note (Addendum)
 Repeat CT head today with unchanged thin 2 mm thick subdural hematoma along the left cerebral convexity, No mass effect Conservative non operative management per neurosurgery

## 2024-04-07 NOTE — Assessment & Plan Note (Addendum)
 Renal function with serum cr at 1,53 with K at 4.3 and serum bicarbonate at 22 Na 136   Plan to continue isotonic saline at 100 ml per hr Follow up renal function in am Avoid hypotension and nephrotoxic medications

## 2024-04-07 NOTE — Assessment & Plan Note (Signed)
Continue with sertraline  

## 2024-04-07 NOTE — Assessment & Plan Note (Signed)
 Pending BMI calculation

## 2024-04-07 NOTE — Progress Notes (Signed)
 Progress Note   Patient: [name] FMW:[id number] DOB: 09-11-1930 DOA: 04/06/2024     1 DOS: the patient was seen and examined on 04/07/2024   Brief hospital course: Mr. Wakefield was admitted to the hospital with the working diagnosis of hypotension   89 yo male with the past medical history of coronary artery disease, heart failure, CKD and T2DM who presented after a mechanical fall. On the day of hospitalization he was diagnosed with urinary tract infection by his primary care provider. Apparently at home he had not being eating well, very poor oral intake, and having hallucinations. At home sustained a mechanical fall while trying to put is clothes, leaned forward to far. No head trauma, landing over his left side.  EMS was called, he was found with blood pressure 108/60, he was placed on 2 L/min per [place] with 02 saturation 95%, then transported to the ED.  On his initial physical examination in the ED, his blood pressure was 64/48, placed on peripheral norepinephrine with blood pressure 111/80, HR 83, RR 17 and 02 saturation 85% on room air  Lungs with no wheezing or rhonchi, heart with S1 and S2 present and regular with no gallops, abdomen with no distention, no lower extremity edema.   Na 138, K 3.9 Cl 107 bicarbonate 21 glucose 122 bun 20 cr 1,63  AST 27 ALT 25  High sensitive troponin 9  Lactic acid 3,0  Wbc 6.9 hgb 13.5 plt 203  Urine analysis SG 1,028, protein negative, hgb negative, leukocytes negative Wbc 0-5 and rbc 0-5   CT cervical spine with no acute fracture or traumatic malalignment.  CT chest abdomen and pelvis with no acute traumatic injury identified in the chest, abdomen or pelvis.  No acute rib fracture identified.  CT head with thin subdural hematoma over the left frontal convexity without mass effect.  Mild chronic small vessel ischemic disease.   Assessment and Plan: * Dehydration Severe hypotension, requiring vasopressors, no clinical sepsis.  Today is  off vasopressors.   Plan to continue IV fluids with isotonic saline at 100 ml per hr Follow up with echocardiogram.  Hold on antihypertensive medications.   HTN (hypertension), benign Continue to hold on antihypertensive medications, at home on amlodipine   Continue blood pressure monitoring   CAD (coronary artery disease) No chest pain, no acute coronary syndrome  Hold on aspirin  in the setting of small subdural hematoma   Chronic kidney disease, stage 3a (HCC) Renal function with serum cr at 1,53 with K at 4.3 and serum bicarbonate at 22 Na 136   Plan to continue isotonic saline at 100 ml per hr Follow up renal function in am Avoid hypotension and nephrotoxic medications   UTI (urinary tract infection) Old cultures positive for klebsiella sensitive to ceftriaxone  Continue antibiotic therapy with IV ceftrtiaxone. Follow up on cultures.  Keep foley for now.   Gout No signs of acute attack   Type 2 diabetes mellitus with hyperlipidemia (HCC) Continue glucose cover and monitoring with insulin  sliding scale Advance diet  Continue with statin therapy   Subdural hematoma (HCC) Repeat CT head today with unchanged thin 2 mm thick subdural hematoma along the left cerebral convexity, No mass effect Conservative non operative management per neurosurgery   Depression Continue with sertraline    Obesity (BMI 30-39.9) Pending BMI calculation       Subjective: Patient complains of left sided pain, no chest pain and no dyspnea, a foley catheter is in place   Physical Exam: Vitals:  04/07/24 1130 04/07/24 1145 04/07/24 1200 04/07/24 1330  BP: 115/72 118/72 109/65 125/75  Pulse: 88 87 87 88  Resp: 17 16 12  (!) 21  Temp:   99.6 F (37.6 C)   TempSrc:   Oral   SpO2: 98% 100% 99% 99%  Height:       Neurology awake and alert, deconditioned and ill looking appearing ENT with mild pallor with no icterus Cardiovascular with S1 and S2 present and regular with no gallops, rubs  or murmurs Respiratory with no rales or wheezing, poor inspiratory effort Abdomen with no distention, soft and non tender No lower extremity edema  Data Reviewed:    Family Communication: no family at the bedside   Disposition: Status is: Inpatient Remains inpatient appropriate because: IV fluids and IV antibiotics   Planned Discharge Destination: Home     Author: Elidia Toribio Furnace, MD 04/07/2024 3:04 PM  For on call review [email or web address].

## 2024-04-07 NOTE — ED Notes (Addendum)
 SABRA

## 2024-04-07 NOTE — Assessment & Plan Note (Signed)
 Severe hypotension, requiring vasopressors, no clinical sepsis.  Today is off vasopressors.   Plan to continue IV fluids with isotonic saline at 100 ml per hr Follow up with echocardiogram.  Hold on antihypertensive medications.

## 2024-04-07 NOTE — Assessment & Plan Note (Addendum)
 No chest pain, no acute coronary syndrome  Hold on aspirin  in the setting of small subdural hematoma

## 2024-04-07 NOTE — Progress Notes (Signed)
 Patient arrived to room 3W16. VS stable, patient oriented to room, call bell in reach.

## 2024-04-08 ENCOUNTER — Inpatient Hospital Stay (HOSPITAL_COMMUNITY)

## 2024-04-08 DIAGNOSIS — N1831 Chronic kidney disease, stage 3a: Secondary | ICD-10-CM | POA: Diagnosis not present

## 2024-04-08 DIAGNOSIS — I453 Trifascicular block: Secondary | ICD-10-CM

## 2024-04-08 DIAGNOSIS — Z01810 Encounter for preprocedural cardiovascular examination: Secondary | ICD-10-CM

## 2024-04-08 DIAGNOSIS — I959 Hypotension, unspecified: Secondary | ICD-10-CM | POA: Diagnosis not present

## 2024-04-08 DIAGNOSIS — E860 Dehydration: Secondary | ICD-10-CM | POA: Diagnosis not present

## 2024-04-08 DIAGNOSIS — S065XAA Traumatic subdural hemorrhage with loss of consciousness status unknown, initial encounter: Secondary | ICD-10-CM | POA: Diagnosis not present

## 2024-04-08 DIAGNOSIS — I5022 Chronic systolic (congestive) heart failure: Secondary | ICD-10-CM

## 2024-04-08 DIAGNOSIS — I2510 Atherosclerotic heart disease of native coronary artery without angina pectoris: Secondary | ICD-10-CM | POA: Diagnosis not present

## 2024-04-08 LAB — BASIC METABOLIC PANEL WITH GFR
Anion gap: 12 (ref 5–15)
BUN: 13 mg/dL (ref 8–23)
CO2: 21 mmol/L — ABNORMAL LOW (ref 22–32)
Calcium: 8.5 mg/dL — ABNORMAL LOW (ref 8.9–10.3)
Chloride: 105 mmol/L (ref 98–111)
Creatinine, Ser: 1.17 mg/dL (ref 0.61–1.24)
GFR, Estimated: 59 mL/min — ABNORMAL LOW (ref >60)
Glucose, Bld: 118 mg/dL — ABNORMAL HIGH (ref 70–99)
Potassium: 3.6 mmol/L (ref 3.5–5.1)
Sodium: 138 mmol/L (ref 135–145)

## 2024-04-08 LAB — CBC
HCT: 38.9 % — ABNORMAL LOW (ref 39.0–52.0)
Hemoglobin: 12.7 g/dL — ABNORMAL LOW (ref 13.0–17.0)
MCH: 29.1 pg (ref 26.0–34.0)
MCHC: 32.6 g/dL (ref 30.0–36.0)
MCV: 89 fL (ref 80.0–100.0)
Platelets: 126 K/uL — ABNORMAL LOW (ref 150–400)
RBC: 4.37 MIL/uL (ref 4.22–5.81)
RDW: 13.9 % (ref 11.5–15.5)
WBC: 7.6 K/uL (ref 4.0–10.5)
nRBC: 0 % (ref 0.0–0.2)

## 2024-04-08 LAB — GLUCOSE, CAPILLARY
Glucose-Capillary: 116 mg/dL — ABNORMAL HIGH (ref 70–99)
Glucose-Capillary: 119 mg/dL — ABNORMAL HIGH (ref 70–99)
Glucose-Capillary: 151 mg/dL — ABNORMAL HIGH (ref 70–99)
Glucose-Capillary: 140 mg/dL — ABNORMAL HIGH (ref 70–99)

## 2024-04-08 LAB — MAGNESIUM: Magnesium: 1.8 mg/dL (ref 1.7–2.4)

## 2024-04-08 LAB — BRAIN NATRIURETIC PEPTIDE: B Natriuretic Peptide: 125.7 pg/mL — ABNORMAL HIGH (ref 0.0–100.0)

## 2024-04-08 MED ORDER — METOPROLOL SUCCINATE ER 25 MG PO TB24
25.0000 mg | ORAL_TABLET | Freq: Every day | ORAL | Status: DC
  Administered 2024-04-08 – 2024-04-16 (×9): 25 mg via ORAL
  Filled 2024-04-08 (×9): qty 1

## 2024-04-08 MED ORDER — FENTANYL CITRATE (PF) 50 MCG/ML IJ SOSY
25.0000 ug | PREFILLED_SYRINGE | INTRAMUSCULAR | Status: DC | PRN
  Administered 2024-04-08: 25 ug via INTRAVENOUS
  Filled 2024-04-08: qty 1

## 2024-04-08 MED ORDER — ENSURE PLUS HIGH PROTEIN PO LIQD
237.0000 mL | Freq: Two times a day (BID) | ORAL | Status: DC
  Administered 2024-04-09 – 2024-04-16 (×13): 237 mL via ORAL

## 2024-04-08 MED ORDER — ORAL CARE MOUTH RINSE: 15.0000 mL | OROMUCOSAL | Status: DC | PRN

## 2024-04-08 NOTE — Consult Note (Signed)
 CARDIOLOGY CONSULT NOTE       Patient ID: [name] MRN: [id number] DOB/AGE: Jan 27, 1931 89 y.o.  Admit date: 04/06/2024 Referring Physician: Drusilla Primary Physician: Maree Leni Edyth DELENA, MD Primary Cardiologist: Croitoru Reason for Consultation: Preoperative   Principal Problem:   Dehydration Active Problems:   Type 2 diabetes mellitus with hyperlipidemia (HCC)   Obesity (BMI 30-39.9)   Gout   HTN (hypertension), benign   Chronic kidney disease, stage 3a (HCC)   CAD (coronary artery disease)   UTI (urinary tract infection)   Subdural hematoma (HCC)   Depression   HPI:  89 y.o. admitted to hospital with fall He has had frequent UTI's and hallucinates with poor PO intake. Son indicates he is either in the bed all day or at most goes 5 steps to recliner and stays there. Hypotensive / dehydrated in ER with saline bolus and peripheral NE. Lactic acid 3.1 Started on Vanc, ceftriaxone  and zosyn . He has a chronic appearing left frontal subdural hematoma. Cr up to 1.63 from azotemia He has extensive history of CAD and ischemic DCM with EF on last echo 2019 of 40-45% Has stents in proximal/mid RCA last intervention 2019 and prior complex stenting of the mid LCX and OM branches. He has not had any recent admission for angina or CHF. Xray suggested subcapital left femoral neck fracture f/u CT pending. Pain control seems ok   ROS All other systems reviewed and negative except as noted above  Past Medical History:  Diagnosis Date   Arthritis    all over (11/12/2017)   CAD (coronary artery disease) 11/11/2017   S/p IL STEMI 6/19:  S/p DES to pLCx and DES x 2 to OM2 // staged PCI 6/19:  DES x 2 to RCA // residual CAD at cath 6/19:  mLAD 80, dLAD 90, D1 95 tx medically // EF 40-45 by echo   Chronic combined systolic and diastolic CHF (congestive heart failure) (HCC)    ischemic CM // s/p MI in 11/2017 >> Echo 6/19:  Moderate concentric LVH, EF 40-45, grade 1 diastolic dysfunction,  inferior, inferolateral, apical inferior akinesis    Chronic lower back pain    CKD (chronic kidney disease), stage III (HCC)    Constipation    takes an OTC med as needed   Diabetic nephropathy (HCC)    Gastric ulcer 1970s   Glaucoma, both eyes    uses eye drops daily   Gout    takes Allopurinol  daily   History of colon polyps    History of ST elevation myocardial infarction (STEMI) 11/10/2017   PCI to LCx and staged PCI to RCA   History of stress test    a. 09/2014 MV: EF 53%, no ischemia/infarct.   Hyperlipidemia    takes Pravastatin  daily   Hypertension    takes Amlodipine  and Losartan  daily   Insomnia    uses OTC sleep aide   Joint pain    Joint swelling    right knee    Legally blind    Macular degeneration    Morbid obesity (HCC)    Nocturia    Peripheral edema    Pleurisy 20+yrs ago   Pneumonia    I think once when I was a kid (11/12/2017)   Sleep apnea    Stroke (HCC)    I've had them; don't know anything about them; denies residual on 11/12/2017)   Syncope    a. 09/2014 in setting of orthostasis; b. 10/2014 Event monitor: 19 beat  run of asymp NSVT; c. 01/2015 Echo: EF 60-65%, no rwma.   Type II diabetes mellitus (HCC)    takes Metformin  daily   Urinary frequency    takes Flomax  daily    Family History  Problem Relation Age of Onset   Cancer Mother    Alzheimer's disease Maternal Aunt    Anesthesia problems Neg Hx     Social History   Socioeconomic History   Marital status: Widowed    Spouse name: Not on file   Number of children: Not on file   Years of education: Not on file   Highest education level: Not on file  Occupational History   Not on file  Tobacco Use   Smoking status: Former    Current packs/day: 2.00    Types: Cigarettes   Smokeless tobacco: Never   Tobacco comments:    11/12/2017 nothing since the 1980s  Vaping Use   Vaping status: Never Used  Substance and Sexual Activity   Alcohol  use: Not Currently    Comment: 11/12/2017  nothing since the 1980s   Drug use: Never   Sexual activity: Not Currently  Other Topics Concern   Not on file  Social History Narrative   Legally blind.   Social Drivers of Health   Financial Resource Strain: Medium Risk (02/05/2018)   Overall Financial Resource Strain (CARDIA)    Difficulty of Paying Living Expenses: Somewhat hard  Food Insecurity: No Food Insecurity (04/07/2024)   Hunger Vital Sign    Worried About Running Out of Food in the Last Year: Never true    Ran Out of Food in the Last Year: Never true  Transportation Needs: No Transportation Needs (04/07/2024)   PRAPARE - Administrator, Civil Service (Medical): No    Lack of Transportation (Non-Medical): No  Physical Activity: Unknown (02/06/2018)   Exercise Vital Sign    Days of Exercise per Week: Patient declined    Minutes of Exercise per Session: Patient declined  Stress: No Stress Concern Present (02/06/2018)   [employer] of Occupational Health - Occupational Stress Questionnaire    Feeling of Stress : Only a little  Social Connections: Socially Isolated (04/07/2024)   Social Connection and Isolation Panel    Frequency of Communication with Friends and Family: Once a week    Frequency of Social Gatherings with Friends and Family: Once a week    Attends Religious Services: Never    Database Administrator or Organizations: No    Attends Banker Meetings: Never    Marital Status: Widowed  Intimate Partner Violence: Not At Risk (04/07/2024)   Humiliation, Afraid, Rape, and Kick questionnaire    Fear of Current or Ex-Partner: No    Emotionally Abused: No    Physically Abused: No    Sexually Abused: No    Past Surgical History:  Procedure Laterality Date   BACK SURGERY     CATARACT EXTRACTION W/ INTRAOCULAR LENS  IMPLANT, BILATERAL Bilateral    COLONOSCOPY     CORONARY ANGIOGRAPHY N/A 11/12/2017   Procedure: CORONARY ANGIOGRAPHY;  Surgeon: Burnard Debby LABOR, MD;  Location: [facility];  Service: Cardiovascular;  Laterality: N/A;   CORONARY STENT INTERVENTION N/A 11/12/2017   Procedure: CORONARY STENT INTERVENTION;  Surgeon: Burnard Debby LABOR, MD;  Location: [facility];  Service: Cardiovascular;  Laterality: N/A;   CORONARY/GRAFT ACUTE MI REVASCULARIZATION N/A 11/10/2017   Procedure: Coronary/Graft Acute MI Revascularization;  Surgeon: Burnard Debby LABOR, MD;  Location: [facility];  Service: Cardiovascular;  Laterality: N/A;   EYE SURGERY     GLAUCOMA SURGERY Bilateral    INSERTION / PLACEMENT / REVISION NEUROSTIMULATOR  2000s   KNEE ARTHROSCOPY Right 1990s   LEFT HEART CATH AND CORONARY ANGIOGRAPHY N/A 11/10/2017   Procedure: LEFT HEART CATH AND CORONARY ANGIOGRAPHY;  Surgeon: Burnard Debby LABOR, MD;  Location: [facility];  Service: Cardiovascular;  Laterality: N/A;   MINI SHUNT INSERTION  09/26/2011   Procedure: INSERTION OF MINI SHUNT;  Surgeon: Gaither Quan, MD;  Location: [facility] OR;  Service: Ophthalmology;  Laterality: Right;  Insertion of Ahmed shunt   POSTERIOR FUSION LUMBAR SPINE  1990s   REVERSE SHOULDER ARTHROPLASTY Right 09/21/2013   Procedure: RIGHT  SHOULDER ARTHROPLASTY REVERSE ;  Surgeon: Elspeth JONELLE Her, MD;  Location: [facility] OR;  Service: Orthopedics;  Laterality: Right;   SHOULDER INJECTION Left 09/21/2013   Procedure: SHOULDER INJECTION;  Surgeon: Elspeth JONELLE Her, MD;  Location: [facility] OR;  Service: Orthopedics;  Laterality: Left;      Current Facility-Administered Medications:    acetaminophen  (TYLENOL ) tablet 650 mg, 650 mg, Oral, Q6H PRN, Antonetta Moccasin B, NP   atorvastatin  (LIPITOR ) tablet 80 mg, 80 mg, Oral, Daily, Chand, Valinda, MD, 80 mg at 04/08/24 1102   Chlorhexidine  Gluconate Cloth 2 % PADS 6 each, 6 each, Topical, Daily, Antonetta Moccasin B, NP, 6 each at 04/08/24 0930   cyclobenzaprine (FLEXERIL) tablet 5 mg, 5 mg, Oral, TID PRN, Arrien, Mauricio Daniel, MD   NOREEN ON 04/09/2024] feeding supplement (ENSURE PLUS HIGH PROTEIN) liquid 237 mL,  237 mL, Oral, BID BM, Drusilla, Sabas RAMAN, MD   fentaNYL  (SUBLIMAZE ) injection 25 mcg, 25 mcg, Intravenous, Q2H PRN, Howerter, Justin B, DO, 25 mcg at 04/08/24 0059   insulin  aspart (novoLOG ) injection 0-15 Units, 0-15 Units, Subcutaneous, TID WC, Drusilla Sabas RAMAN, MD, 3 Units at 04/08/24 1706   metoprolol  succinate (TOPROL -XL) 24 hr tablet 25 mg, 25 mg, Oral, Daily, Drusilla, Gagan S, MD, 25 mg at 04/08/24 1110   Oral care mouth rinse, 15 mL, Mouth Rinse, PRN, Arrien, Elidia Sieving, MD   oxyCODONE  (Oxy IR/ROXICODONE ) immediate release tablet 5 mg, 5 mg, Oral, Q4H PRN, Arrien, Mauricio Daniel, MD, 5 mg at 04/08/24 1623   polyethylene glycol (MIRALAX / GLYCOLAX) packet 17 g, 17 g, Oral, Daily PRN, Antonetta Moccasin B, NP   sertraline  (ZOLOFT ) tablet 50 mg, 50 mg, Oral, QHS, Chand, Valinda, MD, 50 mg at 04/07/24 2140  atorvastatin   80 mg Oral Daily   Chlorhexidine  Gluconate Cloth  6 each Topical Daily   [START ON 04/09/2024] feeding supplement  237 mL Oral BID BM   insulin  aspart  0-15 Units Subcutaneous TID WC   metoprolol  succinate  25 mg Oral Daily   sertraline   50 mg Oral QHS     Physical Exam: Blood pressure 127/87, pulse (!) 103, temperature 98.9 F (37.2 C), temperature source Oral, resp. rate 18, height 5' 10 (1.778 m), weight 86.2 kg, SpO2 95%.    Elderly black male Very hard of hearing Not very verbal Decreased BS base No murmur Abdomen benign Plus one LE edema  Labs:   Lab Results  Component Value Date   WBC 7.6 04/08/2024   HGB 12.7 (L) 04/08/2024   HCT 38.9 (L) 04/08/2024   MCV 89.0 04/08/2024   PLT 126 (L) 04/08/2024    Recent Labs  Lab 04/06/24 1130 04/07/24 0120 04/08/24 0536  NA 138   < > 138  K 3.9   < > 3.6  CL 107   < > 105  CO2 21*   < > 21*  BUN 20   < > 13  CREATININE 1.63*   < > 1.17  CALCIUM  8.8*   < > 8.5*  PROT 7.0  --   --   BILITOT 0.9  --   --   ALKPHOS 71  --   --   ALT 25  --   --   AST 27  --   --   GLUCOSE 122*   < > 118*   < > = values in  this interval not displayed.   Lab Results  Component Value Date   TROPONINI <0.03 12/09/2017    Lab Results  Component Value Date   CHOL 138 11/11/2017   CHOL 135 12/23/2012   Lab Results  Component Value Date   HDL 36 (L) 11/11/2017   HDL 48 12/23/2012   Lab Results  Component Value Date   LDLCALC 88 11/11/2017   LDLCALC 77 12/23/2012   Lab Results  Component Value Date   TRIG 72 11/11/2017   TRIG 51 12/23/2012   Lab Results  Component Value Date   CHOLHDL 3.8 11/11/2017   CHOLHDL 2.8 12/23/2012   No results found for: LDLDIRECT    Radiology: [facility] Chest Port 1 View Result Date: 04/08/2024 CLINICAL DATA:  Sinus tachycardia.  Fall.  Back pain. EXAM: PORTABLE CHEST 1 VIEW COMPARISON:  04/06/2024 FINDINGS: Lungs are hypoinflated with elevation of the right hemidiaphragm. Patient is rotated to the right. There is worsening bilateral perihilar/bibasilar opacification likely interstitial edema, although infection is possible. No definite effusion. No pneumothorax. Borderline stable cardiomegaly. No acute fracture. IMPRESSION: 1. Worsening bilateral perihilar/bibasilar opacification likely interstitial edema, although infection is possible. 2. Borderline stable cardiomegaly. Electronically Signed   By: Toribio Agreste M.D.   On: 04/08/2024 08:51   DG HIP PORT UNILAT WITH PELVIS 1V LEFT Result Date: 04/08/2024 CLINICAL DATA:  Fall with left hip pain. EXAM: DG HIP (WITH OR WITHOUT PELVIS) 1V PORT LEFT COMPARISON:  02/08/2018 FINDINGS: Exam demonstrates diffuse decreased bone mineralization. Exam demonstrates mild symmetric osteoarthritic change of the hips. Irregularity over the subcapital region of the left femoral neck with lateral cortical step-off likely representing a fracture, although distinct lucent fracture line not visualized. Less likely, these findings may be due to a collar osteophyte. Mild irregularity over the right inferior pubic ramus likely due to old fracture.  Degenerative changes of the spine and sacroiliac joints. IMPRESSION: 1. Findings suggesting a subcapital left femoral neck fracture. Less likely, these findings may be due to a collar osteophyte. CT recommended for further evaluation. 2. Mild symmetric osteoarthritic change of the hips. Electronically Signed   By: Toribio Agreste M.D.   On: 04/08/2024 08:47   CT Head Wo Contrast Result Date: 04/06/2024 EXAM: CT HEAD WITHOUT CONTRAST 04/06/2024 11:14:10 PM TECHNIQUE: CT of the head was performed without the administration of intravenous contrast. Automated exposure control, iterative reconstruction, and/or weight based adjustment of the mA/kV was utilized to reduce the radiation dose to as low as reasonably achievable. COMPARISON: CT head earlier today. CLINICAL HISTORY: Repeat, 2mm SDH FINDINGS: BRAIN AND VENTRICLES: Unchanged thin 2 mm thick subdural hematoma along left cerebral convexity. No mass effect. No evidence of acute infarct. No hydrocephalus. No extra-axial collection. No mass effect or midline shift. Cerebral atrophy. Remote lacunar infarcts in left basal ganglia. Unchanged white matter hypodensities compatible with mild chronic microvascular ischemic change.  ORBITS: No acute abnormality. SINUSES: No acute abnormality. SOFT TISSUES AND SKULL: No acute soft tissue abnormality. No skull fracture. IMPRESSION: 1. Unchanged thin 2 mm thick subdural hematoma along the left cerebral convexity. No mass effect. Electronically signed by: Gilmore Molt MD 04/06/2024 11:27 PM EST RP Workstation: [id number]   CT CHEST ABDOMEN PELVIS W CONTRAST Result Date: 04/06/2024 EXAM: CT CHEST, ABDOMEN AND PELVIS WITH CONTRAST 04/06/2024 01:43:40 PM TECHNIQUE: CT of the chest, abdomen and pelvis was performed with the administration of 75 mL of iohexol  (OMNIPAQUE ) 350 MG/ML injection. Multiplanar reformatted images are provided for review. Automated exposure control, iterative reconstruction, and/or weight based adjustment  of the mA/kV was utilized to reduce the radiation dose to as low as reasonably achievable. COMPARISON: None available. CLINICAL HISTORY: Polytrauma, blunt. History he fell on floor. No LOC/head injury. Pt c/o left sided shoulder, hip, and ribcage pain. FINDINGS: CHEST: MEDIASTINUM AND LYMPH NODES: Heart and pericardium are unremarkable. The central airways are clear. No mediastinal, hilar or axillary lymphadenopathy. No aortic injury. LUNGS AND PLEURA: Suboptimal degradation of the thoracic imaging somewhat limits rib evaluation but no acute fracture identified. No pneumothorax or pulmonary contusion. No pleural effusion. ABDOMEN AND PELVIS: LIVER: The liver is unremarkable. GALLBLADDER AND BILE DUCTS: Gallbladder is unremarkable. No biliary ductal dilatation. SPLEEN: No acute abnormality. PANCREAS: No acute abnormality. ADRENAL GLANDS: No acute abnormality. KIDNEYS, URETERS AND BLADDER: Large simple fluid attenuation cyst of the left kidney on post-contrast imaging measures 5.3 cm. Similar smaller fluid density cysts in the right kidney measures 2.7 cm. Per consensus, no follow-up is needed for simple Bosniak type 1 and 2 renal cysts, unless the patient has a malignancy history or risk factors. No stones in the kidneys or ureters. No hydronephrosis. No perinephric or periureteral stranding. Urinary bladder is unremarkable. GI AND BOWEL: Stomach demonstrates no acute abnormality. There is no bowel obstruction. REPRODUCTIVE ORGANS: No acute abnormality. PERITONEUM AND RETROPERITONEUM: No ascites. No free air. VASCULATURE: Aorta is normal in caliber. ABDOMINAL AND PELVIS LYMPH NODES: No lymphadenopathy. REPRODUCTIVE ORGANS: No acute abnormality. BONES AND SOFT TISSUES: Suboptimal degradation of the thoracic imaging somewhat limits rib evaluation but no acute fracture identified. Pakind fracture identified. Spinal cord stimulator device noted in the mid thoracic spine. Degenerative changes of the lumbar spine most  severe at L1-L2. No pelvic fracture or hip fracture. No focal soft tissue abnormality. IMPRESSION: 1. No acute traumatic injury identified in the chest, abdomen, or pelvis. 2. Suboptimal thoracic imaging limits rib evaluation; no acute rib fracture identified. Electronically signed by: Norleen Boxer MD 04/06/2024 02:52 PM EST RP Workstation: [id number]   CT Head Wo Contrast Result Date: 04/06/2024 EXAM: CT HEAD WITHOUT CONTRAST 04/06/2024 01:43:40 PM TECHNIQUE: CT of the head was performed without the administration of intravenous contrast. Automated exposure control, iterative reconstruction, and/or weight based adjustment of the mA/kV was utilized to reduce the radiation dose to as low as reasonably achievable. COMPARISON: CT head 09/20/2021. CLINICAL HISTORY: Head trauma, minor (Age >= 65y). FINDINGS: BRAIN AND VENTRICLES: A new thin hyperdense subdural hematoma over the left lateral frontal convexity measures up to 2 mm in thickness without associated mass effect. No acute infarct, mass, or hydrocephalus is evident. There is moderate cerebral atrophy. Patchy cerebral white matter hypodensities are stable to mildly increased in nonspecific but compatible with mild chronic small vessel ischemic disease. Chronic lacunar infarcts are noted in the basal ganglia. Calcified atherosclerosis at the skull base. ORBITS: Bilateral cataract extraction. SINUSES: No acute abnormality. SOFT TISSUES AND SKULL: No  acute soft tissue abnormality. No skull fracture. Emergent findings were communicated to Dr. Sharlet by telephone on 04/06/2024 at 2:39 pm. IMPRESSION: 1. Thin subdural hematoma over the left frontal convexity without mass effect. 2. Mild chronic small vessel ischemic disease. Electronically signed by: Dasie Hamburg MD 04/06/2024 02:46 PM EST RP Workstation: [id number]   CT Cervical Spine Wo Contrast Result Date: 04/06/2024 EXAM: CT CERVICAL SPINE WITHOUT CONTRAST 04/06/2024 01:43:40 PM TECHNIQUE: CT of the cervical  spine was performed without the administration of intravenous contrast. Multiplanar reformatted images are provided for review. Automated exposure control, iterative reconstruction, and/or weight based adjustment of the mA/kV was utilized to reduce the radiation dose to as low as reasonably achievable. COMPARISON: CT cervical spine 09/20/2021 CLINICAL HISTORY: FINDINGS: CERVICAL SPINE: BONES AND ALIGNMENT: Chronic straightening of the normal cervical lordosis and trace anterolisthesis of C4 on C5. No acute fracture or suspicious lesion. Unchanged chronic fracture or developmental incomplete fusion of the posterior C1 ring on the right. DEGENERATIVE CHANGES: Widespread advanced cervical disc degeneration. Asymmetrically advanced facet arthrosis on the left at C2-C3 and on the right at C4-C5. No evidence of high grade spinal canal stenosis. SOFT TISSUES: No prevertebral soft tissue swelling. IMPRESSION: 1. No acute cervical spine fracture or traumatic malalignment. Electronically signed by: Dasie Hamburg MD 04/06/2024 02:34 PM EST RP Workstation: [id number]   DG Chest Port 1 View if patient is in a treatment room. Result Date: 04/06/2024 EXAM: 1 VIEW(S) XRAY OF THE CHEST 04/06/2024 11:41:00 AM COMPARISON: 01/04/2023 CLINICAL HISTORY: Suspected Sepsis FINDINGS: LUNGS AND PLEURA: Low lung volumes. Diffuse interstitial opacities, similar to previous exam which is favored to represent chronic interstitial lung disease. No pulmonary edema. No pleural effusion. No pneumothorax. HEART AND MEDIASTINUM: Spinal cord stimulator noted. No acute abnormality of the cardiac and mediastinal silhouettes. BONES AND SOFT TISSUES: Right shoulder arthroplasty noted. No acute osseous abnormality. IMPRESSION: 1. No acute cardiopulmonary process identified. Electronically signed by: Waddell Calk MD 04/06/2024 01:15 PM EST RP Workstation: [id number]    EKG: SR trifasicular block with first degree RBBB LAFB   ASSESSMENT AND PLAN:    Patient is not a candidate for orthopedic surgery should CT show fracture as opposed to osteophyte. He is 26, horrible functional status, complex CAD with multiple prior stents, tri fasicular block, ischemic cardio myopathy. Admitted with azotemia and elevated lactic acid. Spoke with son at bedside and daughter Derinda who is LICENSED CONVEYANCER. She agrees with no surgery and also with DNR status. Hold diuretics, resume beta blocker ASA 81 mg. Update TTE for EF since last evaluated in 2019.    SignedBETHA Maude Emmer 04/08/2024, 6:21 PM

## 2024-04-08 NOTE — Progress Notes (Signed)
 [facility] night cross cover note:   I was notified by the patient's RN of the patient's increased heart rate relative to dayshift.   In the setting of sinus rhythm, heart rates tonight noted to be in the 90s to low 100s, which, per chart review, appears to be relative to heart rates in the 70s to 90s throughout dayshift.  Patient's RN conveys that the patient is currently asymptomatic, and notes improvement in pain control following initiation of prn IV fentanyl .  Additional vital signs at this time are notable for afebrile; most recent blood pressure 145/93, and oxygen saturation of 98% on 2 L nasal cannula, upon which he was during dayshift.   Per brief chart review, patient was hospitalized with small subdural hematoma, hypotension, acute kidney injury after fall as an outpatient.  Blood pressures improved following brief period of time which she was receiving pressors on day of admission.  Renal function improving, but not yet at baseline per most recent BMP, with this morning's BMP currently pending.  He has been receiving normal saline at 100 cc/h.   Additional medical history notable for chronic systolic/diastolic heart failure, with most recent echocardiogram in June 2019 showing LVEF 40 to 45% as well as grade 1 diastolic dysfunction.  He also has a history of essential hypertension, for which he is on amlodipine  as an outpatient.  Does not appear to be on any AV nodal blocking agents at home.  In the setting of increasing heart rates while receiving IV fluids in the context of a history of chronic systolic/diastolic heart failure, will hold ivf's for now and check updated cxr as well as BNP. His existing morning labs, which include BMP, magnesium level, and CBC, are all pending at this time.  Clinically, relative tachycardia on the basis of infection appears less likely at this time. He remains afebrile. Will follow for result of cxr as well as this AM's cbc.      Eva Pore,  DO Hospitalist

## 2024-04-08 NOTE — Plan of Care (Signed)
 C/O generalized pain and pain to left side from fall at home.  [place] he does not fall often then later admitted that he falls often at home and said he may need more help at home.  [place] he is glad to be in the hospital to get the help he needs.    Problem: Fluid Volume: Goal: Hemodynamic stability will improve Outcome: Progressing   Problem: Clinical Measurements: Goal: Diagnostic test results will improve Outcome: Progressing   Problem: Respiratory: Goal: Ability to maintain adequate ventilation will improve Outcome: Progressing   Problem: Coping: Goal: Ability to adjust to condition or change in health will improve Outcome: Progressing   Problem: Metabolic: Goal: Ability to maintain appropriate glucose levels will improve Outcome: Progressing   Problem: Skin Integrity: Goal: Risk for impaired skin integrity will decrease Outcome: Progressing   Problem: Activity: Goal: Risk for activity intolerance will decrease Outcome: Progressing   Problem: Pain Managment: Goal: General experience of comfort will improve and/or be controlled Outcome: Progressing

## 2024-04-08 NOTE — Progress Notes (Signed)
 OT Cancellation Note  Patient Details Name: [name] MRN: [id number] DOB: [birth year]   Cancelled Treatment:    Reason Eval/Treat Not Completed: Medical issues which prohibited therapy. Per chart, new findings suggesting a subcapital L femoral neck fx. Pt pending CT for further evaluation. Will hold and initiate OT eval once plan is determined and WB restrictions are provided.   Mackenzey Crownover D., MSOT, OTR/L Acute Rehabilitation Services [phone] Secure Chat Preferred   Ysenia Filice 04/08/2024, 2:10 PM

## 2024-04-08 NOTE — Progress Notes (Signed)
 [facility] night cross cover note:   I was notified by the patient's RN that this patient is complaining of left lower extremity pain, including left hip pain that started after his fall onto his left side as an outpatient.  RN conveys no dedicated left hip imaging thus far.  Patient notes that the left hip and low back discomfort are refractory to his existing order for as needed oxycodone , and that he has not yet due for his next dose of prn oxycodone .  I subsequently added fentanyl  25 mcg IV every 2 hours as needed for refractory pain, and also ordered a plain film of the left hip to further evaluate the above.      Eva Pore, DO Hospitalist

## 2024-04-08 NOTE — Progress Notes (Signed)
 [facility]  PROGRESS NOTE  MICHAEAL Duffy FMW:[id number] DOB: July 21, 1930 DOA: 04/06/2024 PCP: Maree Leni Edyth DELENA, MD   Brief HPI:   89 yo male with the past medical history of coronary artery disease, heart failure, CKD and T2DM who presented after a mechanical fall. On the day of hospitalization he was diagnosed with urinary tract infection by his primary care provider. Apparently at home he had not being eating well, very poor oral intake, and having hallucinations. At home sustained a mechanical fall while trying to put is clothes, leaned forward to far. No head trauma, landing over his left side.  EMS was called, he was found with blood pressure 108/60, he was placed on 2 L/min per [place] with 02 saturation 95%, then transported to the ED.  On his initial physical examination in the ED, his blood pressure was 64/48, placed on peripheral norepinephrine with blood pressure 111/80, HR 83, RR 17 and 02 saturation 85% on room air    CT cervical spine with no acute fracture or traumatic malalignment.  CT chest abdomen and pelvis with no acute traumatic injury identified in the chest, abdomen or pelvis.  No acute rib fracture identified.  CT head with thin subdural hematoma over the left frontal convexity without mass effect.  Mild chronic small vessel ischemic disease.     Assessment/Plan:    Left hip pain/subcapital left femoral neck fracture -Patient complained of left hip pain last night, x-ray of hip with pelvis on left done this morning showed findings suggesting subcapital left femoral neck fracture - CT hip recommended - Will consult orthopedics, Dr. Edna will see patient today - Will obtain CT of left hip  Preop evaluation/history of CAD/chronic combined systolic and diastolic CHF - Patient has significant history of CAD, multivessel disease, STEMI in 2019, treated with DES x 3 to proximal left circumflex, proximal OMI and distal OMI with PCI to mid distal RCA with DES x  2 - Also has combined systolic and diastolic heart failure with last echo from 2019 showed EF of 40 to 45%; patient's BNP obtained today was 125 - Does not appear to be in acute heart failure -Revised cardiac risk index score of 3; 10% risk of major cardiac event -Will consult cardiology for further risk stratification  Subdural hematoma -Repeat CT head with unchanged ten 2 mm thick subdural hematoma along the left cerebral convexity, no mass effect - Conservative management per neurosurgery,  Hypotension - Patient had severe hypotension requiring vasopressors; likely in setting of dehydration - No clinical signs of sepsis - He is now off vasopressors - Echocardiogram has been ordered  Hypertension - Patient blood pressure is now elevated - Also found to have sinus tachycardia - He was taking metoprolol  at home, will restart metoprolol  25 mg daily  CKD stage IIIa - Creatinine has improved with IV fluids -Creatinine is 1.17  UTI - Started on Rocephin  - Urine culture from 04/06/2024 showed no growth - Will discontinue Rocephin   Diabetes mellitus type 2 - Continue sliding scale insulin  NovoLog  - CBG well-controlled    DVT prophylaxis: SCDs  Medications     atorvastatin   80 mg Oral Daily   Chlorhexidine  Gluconate Cloth  6 each Topical Daily   insulin  aspart  0-15 Units Subcutaneous TID WC   sertraline   50 mg Oral QHS     Data Reviewed:   CBG:  Recent Labs  Lab 04/07/24 0805 04/07/24 1203 04/07/24 1652 04/07/24 2130 04/08/24 0617  GLUCAP 114* 105* 132* 136*  116*    SpO2: 99 % O2 Flow Rate (L/min): 2 L/min    Vitals:   04/07/24 2337 04/08/24 0426 04/08/24 0559 04/08/24 0812  BP: (!) 145/93 (!) 146/101 (!) 138/99 (!) 143/81  Pulse: 99 (!) 108 (!) 102 (!) 101  Resp:    16  Temp: 98.3 F (36.8 C) 98.3 F (36.8 C)  98.6 F (37 C)  TempSrc: Oral Oral  Oral  SpO2: 98% 99% 100% 99%  Weight:      Height:          Data Reviewed:  Basic Metabolic  Panel: Recent Labs  Lab 04/06/24 1130 04/07/24 0120 04/08/24 0536  NA 138 136 138  K 3.9 4.3 3.6  CL 107 102 105  CO2 21* 23 21*  GLUCOSE 122* 122* 118*  BUN 20 18 13   CREATININE 1.63* 1.53* 1.17  CALCIUM  8.8* 8.4* 8.5*  MG  --   --  1.8  PHOS  --  3.7  --     CBC: Recent Labs  Lab 04/06/24 1130 04/07/24 0120 04/08/24 0156  WBC 6.9 8.5 7.6  NEUTROABS 4.8  --   --   HGB 13.5 12.7* 12.7*  HCT 41.4 39.2 38.9*  MCV 89.2 89.5 89.0  PLT 203 156 126*    LFT Recent Labs  Lab 04/06/24 1130 04/07/24 0120  AST 27  --   ALT 25  --   ALKPHOS 71  --   BILITOT 0.9  --   PROT 7.0  --   ALBUMIN 3.3* 3.0*     Antibiotics: Anti-infectives (From admission, onward)    Start     Dose/Rate Route Frequency Ordered Stop   04/07/24 1215  cefTRIAXone  (ROCEPHIN ) 1 g in sodium chloride  0.9 % 100 mL IVPB        1 g 200 mL/hr over 30 Minutes Intravenous Every 24 hours 04/07/24 1205     04/06/24 1300  vancomycin  (VANCOREADY) IVPB 2000 mg/400 mL        2,000 mg 200 mL/hr over 120 Minutes Intravenous  Once 04/06/24 1250 04/06/24 1645   04/06/24 1300  piperacillin -tazobactam (ZOSYN ) IVPB 3.375 g        3.375 g 100 mL/hr over 30 Minutes Intravenous  Once 04/06/24 1250 04/06/24 1612   04/06/24 1145  cefTRIAXone  (ROCEPHIN ) 2 g in sodium chloride  0.9 % 100 mL IVPB  Status:  Discontinued        2 g 200 mL/hr over 30 Minutes Intravenous Every 24 hours 04/06/24 1137 04/06/24 1745        CONSULTS neurosurgery  Code Status: Full code  Family Communication: No family at bedside     Subjective   Denies chest pain or shortness of breath.  Complains of left hip pain   Objective    Physical Examination:   Appears in no acute distress S1-S2, regular Abdomen is soft, nontender. Extremities no edema   Status is: Inpatient:             [name] Brod   [facility] If 7PM-7AM, please contact night-coverage at [email or web address], Office  [phone]   04/08/2024,  10:03 AM  LOS: 2 days

## 2024-04-08 NOTE — Plan of Care (Signed)
  Problem: Fluid Volume: Goal: Hemodynamic stability will improve Outcome: Progressing   Problem: Respiratory: Goal: Ability to maintain adequate ventilation will improve Outcome: Progressing   Problem: Coping: Goal: Ability to adjust to condition or change in health will improve Outcome: Progressing   Problem: Skin Integrity: Goal: Risk for impaired skin integrity will decrease Outcome: Progressing   Problem: Clinical Measurements: Goal: Will remain free from infection Outcome: Progressing   Problem: Pain Managment: Goal: General experience of comfort will improve and/or be controlled Outcome: Progressing

## 2024-04-08 NOTE — Progress Notes (Signed)
 PT Cancellation Note  Patient Details Name: [name] MRN: [id number] DOB: [birth year]   Cancelled Treatment:    Reason Eval/Treat Not Completed: Medical issues which prohibited therapy (Acute PT to hold due to new findings suggesting a subcapital L femoral neck fx. Pt pending CT for further evaluation. Will continue to follow and attempt eval as medically appropriate.)  Kate ORN, PT, DPT Secure Chat Preferred  Rehab Office [phone]  Kate BRAVO [name] 04/08/2024, 12:50 PM

## 2024-04-08 NOTE — TOC Initial Note (Signed)
 Transition of Care [facility]) - Initial/Assessment Note    Patient Details  Name: [name] MRN: [id number] Date of Birth: 1930/10/24  [facility] CM/SW Contact:    Landry DELENA Senters, RN Phone Number: 04/08/2024, 1:04 PM  Clinical Narrative:                  Patient is from home with caregiver, Ozell. Patient gave case management permission to call Ozell. Ozell states he is with patient most of the time. Caregiver provides transportation and manages medications.   Awaiting therapy evals.   CM will follow.    Expected Discharge Plan:  (TBD) Barriers to Discharge: Continued Medical Work up   Patient Goals and CMS Choice            Expected Discharge Plan and Services       Living arrangements for the past 2 months: Single Family Home                                      Prior Living Arrangements/Services Living arrangements for the past 2 months: Single Family Home Lives with:: Self, Other (Comment) (caregiver) Patient language and need for interpreter reviewed:: Yes Do you feel safe going back to the place where you live?: Yes      Need for Family Participation in Patient Care: Yes (Comment) Care giver support system in place?: Yes (comment) Current home services: DME (walker, shower seat, wheelchair) Criminal Activity/Legal Involvement Pertinent to Current Situation/Hospitalization: No - Comment as needed  Activities of Daily Living   ADL Screening (condition at time of admission) Independently performs ADLs?: Yes (appropriate for developmental age) Is the patient deaf or have difficulty hearing?: No Does the patient have difficulty seeing, even when wearing glasses/contacts?: Yes (blind in right eye,left eye sees shadows) Does the patient have difficulty concentrating, remembering, or making decisions?: No  Permission Sought/Granted   Permission granted to share information with : Yes, Verbal Permission Granted  Share Information  with NAME: Ozell Barman           Emotional Assessment Appearance:: Developmentally appropriate Attitude/Demeanor/Rapport: Engaged Affect (typically observed): Calm Orientation: : Oriented to Self, Oriented to Place, Oriented to  Time, Oriented to Situation Alcohol  / Substance Use: Not Applicable Psych Involvement: No (comment)  Admission diagnosis:  Dehydration [E86.0] SDH (subdural hematoma) (HCC) [S06.5XAA] Hypotension [I95.9] Fall, initial encounter [W19.XXXA] Hypotension, unspecified hypotension type [I95.9] Patient Active Problem List   Diagnosis Date Noted   UTI (urinary tract infection) 04/07/2024   Subdural hematoma (HCC) 04/07/2024   Depression 04/07/2024   Dehydration 04/06/2024   History of UTI 07/30/2022   Urinary incontinence without sensory awareness 07/30/2022   Acute kidney injury 05/29/2018   Pelvic fracture (HCC) 02/10/2018   Recurrent syncope 01/23/2018   Diarrhea    Chest pain 12/08/2017   Ischemic cardiomyopathy 11/28/2017   Neurocognitive deficits 11/19/2017   Bilious vomiting    Ileus, unspecified (HCC) 11/13/2017   Sleep apnea 11/13/2017   Obesity 11/13/2017   Chronic kidney disease, stage 3a (HCC) 11/11/2017   Chronic combined systolic and diastolic CHF (congestive heart failure) (HCC) 11/11/2017   CAD (coronary artery disease) 11/11/2017   Acute ST elevation myocardial infarction (STEMI) of inferolateral wall (HCC) 11/10/2017   ST elevation myocardial infarction (STEMI) (HCC)    AKI (acute kidney injury) 01/22/2015   Lactic acidosis 01/22/2015   Syncope and collapse    HTN (  hypertension), benign    Encephalopathy 09/07/2014   Osteoarthritis of shoulder 09/21/2013   Gout 12/23/2012   Hypotension 12/21/2012   Type 2 diabetes mellitus with hyperlipidemia (HCC) 12/21/2012   Hyperlipidemia    Obesity (BMI 30-39.9)    PCP:  Maree Leni Edyth DELENA, MD Pharmacy:   [facility] [phone] - [place], [place] - [address] AT [facility] [address] [place] [address] Phone: [phone] Fax: [phone]     Social Drivers of Health (SDOH) Social History: SDOH Screenings   Food Insecurity: No Food Insecurity (04/07/2024)  Housing: Low Risk  (04/07/2024)  Transportation Needs: No Transportation Needs (04/07/2024)  Utilities: Not At Risk (04/07/2024)  Financial Resource Strain: Medium Risk (02/05/2018)  Physical Activity: Unknown (02/06/2018)  Social Connections: Socially Isolated (04/07/2024)  Stress: No Stress Concern Present (02/06/2018)  Tobacco Use: Medium Risk (04/07/2024)   SDOH Interventions:     Readmission Risk Interventions     No data to display

## 2024-04-09 ENCOUNTER — Inpatient Hospital Stay (HOSPITAL_COMMUNITY)

## 2024-04-09 DIAGNOSIS — I5021 Acute systolic (congestive) heart failure: Secondary | ICD-10-CM | POA: Diagnosis not present

## 2024-04-09 DIAGNOSIS — E860 Dehydration: Secondary | ICD-10-CM | POA: Diagnosis not present

## 2024-04-09 DIAGNOSIS — S065XAA Traumatic subdural hemorrhage with loss of consciousness status unknown, initial encounter: Secondary | ICD-10-CM | POA: Diagnosis not present

## 2024-04-09 DIAGNOSIS — I10 Essential (primary) hypertension: Secondary | ICD-10-CM | POA: Diagnosis not present

## 2024-04-09 DIAGNOSIS — N1831 Chronic kidney disease, stage 3a: Secondary | ICD-10-CM | POA: Diagnosis not present

## 2024-04-09 LAB — ECHOCARDIOGRAM COMPLETE
AV Mean grad: 7 mmHg
AV Peak grad: 10.8 mmHg
Ao pk vel: 1.64 m/s
Area-P 1/2: 4.52 cm2
Height: 70 in
S' Lateral: 2.5 cm
Weight: 3040 [oz_av]

## 2024-04-09 LAB — GLUCOSE, CAPILLARY
Glucose-Capillary: 106 mg/dL — ABNORMAL HIGH (ref 70–99)
Glucose-Capillary: 148 mg/dL — ABNORMAL HIGH (ref 70–99)
Glucose-Capillary: 98 mg/dL (ref 70–99)
Glucose-Capillary: 105 mg/dL — ABNORMAL HIGH (ref 70–99)

## 2024-04-09 NOTE — H&P (View-Only) (Signed)
 ORTHOPAEDIC CONSULTATION  REQUESTING PHYSICIAN: Drusilla Sabas RAMAN, MD  Chief Complaint: left hip pain  HPI: [name] is a 89 y.o. male who complains of  left hip pain.  He was previously ambulatory with a walker.  He had a fall a couple of days ago.  He was brought to the emergency room and found to be dehydrated per tensive.  He has been resuscitated over the last couple of days.  He has been taken off vasopressors.  Once he came to he was complaining more of left hip pain.  X-rays done yesterday were concerning for possible femoral neck fracture.  Past Medical History:  Diagnosis Date   Arthritis    all over (11/12/2017)   CAD (coronary artery disease) 11/11/2017   S/p IL STEMI 6/19:  S/p DES to pLCx and DES x 2 to OM2 // staged PCI 6/19:  DES x 2 to RCA // residual CAD at cath 6/19:  mLAD 80, dLAD 90, D1 95 tx medically // EF 40-45 by echo   Chronic combined systolic and diastolic CHF (congestive heart failure) (HCC)    ischemic CM // s/p MI in 11/2017 >> Echo 6/19:  Moderate concentric LVH, EF 40-45, grade 1 diastolic dysfunction, inferior, inferolateral, apical inferior akinesis    Chronic lower back pain    CKD (chronic kidney disease), stage III (HCC)    Constipation    takes an OTC med as needed   Diabetic nephropathy (HCC)    Gastric ulcer 1970s   Glaucoma, both eyes    uses eye drops daily   Gout    takes Allopurinol  daily   History of colon polyps    History of ST elevation myocardial infarction (STEMI) 11/10/2017   PCI to LCx and staged PCI to RCA   History of stress test    a. 09/2014 MV: EF 53%, no ischemia/infarct.   Hyperlipidemia    takes Pravastatin  daily   Hypertension    takes Amlodipine  and Losartan  daily   Insomnia    uses OTC sleep aide   Joint pain    Joint swelling    right knee    Legally blind    Macular degeneration    Morbid obesity (HCC)    Nocturia    Peripheral edema    Pleurisy 20+yrs ago   Pneumonia    I think once when I was a  kid (11/12/2017)   Sleep apnea    Stroke (HCC)    I've had them; don't know anything about them; denies residual on 11/12/2017)   Syncope    a. 09/2014 in setting of orthostasis; b. 10/2014 Event monitor: 19 beat run of asymp NSVT; c. 01/2015 Echo: EF 60-65%, no rwma.   Type II diabetes mellitus (HCC)    takes Metformin  daily   Urinary frequency    takes Flomax  daily   Past Surgical History:  Procedure Laterality Date   BACK SURGERY     CATARACT EXTRACTION W/ INTRAOCULAR LENS  IMPLANT, BILATERAL Bilateral    COLONOSCOPY     CORONARY ANGIOGRAPHY N/A 11/12/2017   Procedure: CORONARY ANGIOGRAPHY;  Surgeon: Burnard Debby LABOR, MD;  Location: [facility];  Service: Cardiovascular;  Laterality: N/A;   CORONARY STENT INTERVENTION N/A 11/12/2017   Procedure: CORONARY STENT INTERVENTION;  Surgeon: Burnard Debby LABOR, MD;  Location: [facility];  Service: Cardiovascular;  Laterality: N/A;   CORONARY/GRAFT ACUTE MI REVASCULARIZATION N/A 11/10/2017   Procedure: Coronary/Graft Acute MI Revascularization;  Surgeon: Burnard Debby  A, MD;  Location: [facility];  Service: Cardiovascular;  Laterality: N/A;   EYE SURGERY     GLAUCOMA SURGERY Bilateral    INSERTION / PLACEMENT / REVISION NEUROSTIMULATOR  2000s   KNEE ARTHROSCOPY Right 1990s   LEFT HEART CATH AND CORONARY ANGIOGRAPHY N/A 11/10/2017   Procedure: LEFT HEART CATH AND CORONARY ANGIOGRAPHY;  Surgeon: Burnard Debby LABOR, MD;  Location: [facility];  Service: Cardiovascular;  Laterality: N/A;   MINI SHUNT INSERTION  09/26/2011   Procedure: INSERTION OF MINI SHUNT;  Surgeon: Gaither Quan, MD;  Location: [facility] OR;  Service: Ophthalmology;  Laterality: Right;  Insertion of Ahmed shunt   POSTERIOR FUSION LUMBAR SPINE  1990s   REVERSE SHOULDER ARTHROPLASTY Right 09/21/2013   Procedure: RIGHT  SHOULDER ARTHROPLASTY REVERSE ;  Surgeon: Elspeth JONELLE Her, MD;  Location: [facility] OR;  Service: Orthopedics;  Laterality: Right;   SHOULDER INJECTION Left  09/21/2013   Procedure: SHOULDER INJECTION;  Surgeon: Elspeth JONELLE Her, MD;  Location: [facility] OR;  Service: Orthopedics;  Laterality: Left;   Social History   Socioeconomic History   Marital status: Widowed    Spouse name: Not on file   Number of children: Not on file   Years of education: Not on file   Highest education level: Not on file  Occupational History   Not on file  Tobacco Use   Smoking status: Former    Current packs/day: 2.00    Types: Cigarettes   Smokeless tobacco: Never   Tobacco comments:    11/12/2017 nothing since the 1980s  Vaping Use   Vaping status: Never Used  Substance and Sexual Activity   Alcohol  use: Not Currently    Comment: 11/12/2017 nothing since the 1980s   Drug use: Never   Sexual activity: Not Currently  Other Topics Concern   Not on file  Social History Narrative   Legally blind.   Social Drivers of Health   Financial Resource Strain: Medium Risk (02/05/2018)   Overall Financial Resource Strain (CARDIA)    Difficulty of Paying Living Expenses: Somewhat hard  Food Insecurity: No Food Insecurity (04/07/2024)   Hunger Vital Sign    Worried About Running Out of Food in the Last Year: Never true    Ran Out of Food in the Last Year: Never true  Transportation Needs: No Transportation Needs (04/07/2024)   PRAPARE - Administrator, Civil Service (Medical): No    Lack of Transportation (Non-Medical): No  Physical Activity: Unknown (02/06/2018)   Exercise Vital Sign    Days of Exercise per Week: Patient declined    Minutes of Exercise per Session: Patient declined  Stress: No Stress Concern Present (02/06/2018)   [employer] of Occupational Health - Occupational Stress Questionnaire    Feeling of Stress : Only a little  Social Connections: Socially Isolated (04/07/2024)   Social Connection and Isolation Panel    Frequency of Communication with Friends and Family: Once a week    Frequency of Social Gatherings with Friends and Family:  Once a week    Attends Religious Services: Never    Database Administrator or Organizations: No    Attends Banker Meetings: Never    Marital Status: Widowed   Family History  Problem Relation Age of Onset   Cancer Mother    Alzheimer's disease Maternal Aunt    Anesthesia problems Neg Hx    No Known Allergies   Positive ROS: All other systems have been  reviewed and were otherwise negative with the exception of those mentioned in the HPI and as above.  Physical Exam: General: Alert, no acute distress Cardiovascular: No pedal edema Respiratory: No cyanosis, no use of accessory musculature Skin: No lesions in the area of chief complaint Neurologic: Sensation intact distally  MUSCULOSKELETAL:  LLE No traumatic wounds, ecchymosis, or rash  Nontender  groin pain with log roll  No knee or ankle effusion  Knee stable to varus/ valgus stress  Sens DPN, SPN, TN intact  Motor EHL, ext, flex 5/5  No significant edema   IMAGING: X-rays and CT scan left hip reviewed demonstrate a minimally displaced valgus impacted femoral neck fracture  Assessment: Principal Problem:   Dehydration Active Problems:   Type 2 diabetes mellitus with hyperlipidemia (HCC)   Obesity (BMI 30-39.9)   Gout   HTN (hypertension), benign   Chronic kidney disease, stage 3a (HCC)   CAD (coronary artery disease)   UTI (urinary tract infection)   Subdural hematoma (HCC)   Depression  Left valgus impacted femoral neck fracture  Plan: X-rays and CT scan were reviewed and discussed with the patient's daughter over the phone.  He has a mildly valgus displaced femoral neck fracture.  He was seen by cardiology yesterday who felt given his multiple comorbidities he would be a poor candidate for any type of surgical intervention.  I discussed with the daughter that cannulated screw fixation is an approximately 20-minute surgery with the goal is to stabilize the fracture for pain control and mobility.   With the current fracture I do not think he would be safe to mobilize and any mobility would put him at risk for displacement of the fracture, thus leaving him bedbound.  Will reach out to my anesthesia colleagues to review his chart and then again to cardiology regarding possibility of going through a cannulated hip pinning for this fracture.  Patient's daughter expresses understanding and agreement with the plan.    TORIBIO DELENA HIGASHI, MD  Contact information:   [id number] 7am-5pm epic message Dr. Higashi, or call office for patient follow up: [phone] After hours and holidays please check [email or web address] for group call information for [facility]

## 2024-04-09 NOTE — Consult Note (Signed)
 ORTHOPAEDIC CONSULTATION  REQUESTING PHYSICIAN: Drusilla Sabas RAMAN, MD  Chief Complaint: left hip pain  HPI: [name] is a 89 y.o. male who complains of  left hip pain.  He was previously ambulatory with a walker.  He had a fall a couple of days ago.  He was brought to the emergency room and found to be dehydrated per tensive.  He has been resuscitated over the last couple of days.  He has been taken off vasopressors.  Once he came to he was complaining more of left hip pain.  X-rays done yesterday were concerning for possible femoral neck fracture.  Past Medical History:  Diagnosis Date   Arthritis    all over (11/12/2017)   CAD (coronary artery disease) 11/11/2017   S/p IL STEMI 6/19:  S/p DES to pLCx and DES x 2 to OM2 // staged PCI 6/19:  DES x 2 to RCA // residual CAD at cath 6/19:  mLAD 80, dLAD 90, D1 95 tx medically // EF 40-45 by echo   Chronic combined systolic and diastolic CHF (congestive heart failure) (HCC)    ischemic CM // s/p MI in 11/2017 >> Echo 6/19:  Moderate concentric LVH, EF 40-45, grade 1 diastolic dysfunction, inferior, inferolateral, apical inferior akinesis    Chronic lower back pain    CKD (chronic kidney disease), stage III (HCC)    Constipation    takes an OTC med as needed   Diabetic nephropathy (HCC)    Gastric ulcer 1970s   Glaucoma, both eyes    uses eye drops daily   Gout    takes Allopurinol  daily   History of colon polyps    History of ST elevation myocardial infarction (STEMI) 11/10/2017   PCI to LCx and staged PCI to RCA   History of stress test    a. 09/2014 MV: EF 53%, no ischemia/infarct.   Hyperlipidemia    takes Pravastatin  daily   Hypertension    takes Amlodipine  and Losartan  daily   Insomnia    uses OTC sleep aide   Joint pain    Joint swelling    right knee    Legally blind    Macular degeneration    Morbid obesity (HCC)    Nocturia    Peripheral edema    Pleurisy 20+yrs ago   Pneumonia    I think once when I was a  kid (11/12/2017)   Sleep apnea    Stroke (HCC)    I've had them; don't know anything about them; denies residual on 11/12/2017)   Syncope    a. 09/2014 in setting of orthostasis; b. 10/2014 Event monitor: 19 beat run of asymp NSVT; c. 01/2015 Echo: EF 60-65%, no rwma.   Type II diabetes mellitus (HCC)    takes Metformin  daily   Urinary frequency    takes Flomax  daily   Past Surgical History:  Procedure Laterality Date   BACK SURGERY     CATARACT EXTRACTION W/ INTRAOCULAR LENS  IMPLANT, BILATERAL Bilateral    COLONOSCOPY     CORONARY ANGIOGRAPHY N/A 11/12/2017   Procedure: CORONARY ANGIOGRAPHY;  Surgeon: Burnard Debby LABOR, MD;  Location: [facility];  Service: Cardiovascular;  Laterality: N/A;   CORONARY STENT INTERVENTION N/A 11/12/2017   Procedure: CORONARY STENT INTERVENTION;  Surgeon: Burnard Debby LABOR, MD;  Location: [facility];  Service: Cardiovascular;  Laterality: N/A;   CORONARY/GRAFT ACUTE MI REVASCULARIZATION N/A 11/10/2017   Procedure: Coronary/Graft Acute MI Revascularization;  Surgeon: Burnard Debby  A, MD;  Location: [facility];  Service: Cardiovascular;  Laterality: N/A;   EYE SURGERY     GLAUCOMA SURGERY Bilateral    INSERTION / PLACEMENT / REVISION NEUROSTIMULATOR  2000s   KNEE ARTHROSCOPY Right 1990s   LEFT HEART CATH AND CORONARY ANGIOGRAPHY N/A 11/10/2017   Procedure: LEFT HEART CATH AND CORONARY ANGIOGRAPHY;  Surgeon: Burnard Debby LABOR, MD;  Location: [facility];  Service: Cardiovascular;  Laterality: N/A;   MINI SHUNT INSERTION  09/26/2011   Procedure: INSERTION OF MINI SHUNT;  Surgeon: Gaither Quan, MD;  Location: [facility] OR;  Service: Ophthalmology;  Laterality: Right;  Insertion of Ahmed shunt   POSTERIOR FUSION LUMBAR SPINE  1990s   REVERSE SHOULDER ARTHROPLASTY Right 09/21/2013   Procedure: RIGHT  SHOULDER ARTHROPLASTY REVERSE ;  Surgeon: Elspeth JONELLE Her, MD;  Location: [facility] OR;  Service: Orthopedics;  Laterality: Right;   SHOULDER INJECTION Left  09/21/2013   Procedure: SHOULDER INJECTION;  Surgeon: Elspeth JONELLE Her, MD;  Location: [facility] OR;  Service: Orthopedics;  Laterality: Left;   Social History   Socioeconomic History   Marital status: Widowed    Spouse name: Not on file   Number of children: Not on file   Years of education: Not on file   Highest education level: Not on file  Occupational History   Not on file  Tobacco Use   Smoking status: Former    Current packs/day: 2.00    Types: Cigarettes   Smokeless tobacco: Never   Tobacco comments:    11/12/2017 nothing since the 1980s  Vaping Use   Vaping status: Never Used  Substance and Sexual Activity   Alcohol  use: Not Currently    Comment: 11/12/2017 nothing since the 1980s   Drug use: Never   Sexual activity: Not Currently  Other Topics Concern   Not on file  Social History Narrative   Legally blind.   Social Drivers of Health   Financial Resource Strain: Medium Risk (02/05/2018)   Overall Financial Resource Strain (CARDIA)    Difficulty of Paying Living Expenses: Somewhat hard  Food Insecurity: No Food Insecurity (04/07/2024)   Hunger Vital Sign    Worried About Running Out of Food in the Last Year: Never true    Ran Out of Food in the Last Year: Never true  Transportation Needs: No Transportation Needs (04/07/2024)   PRAPARE - Administrator, Civil Service (Medical): No    Lack of Transportation (Non-Medical): No  Physical Activity: Unknown (02/06/2018)   Exercise Vital Sign    Days of Exercise per Week: Patient declined    Minutes of Exercise per Session: Patient declined  Stress: No Stress Concern Present (02/06/2018)   [employer] of Occupational Health - Occupational Stress Questionnaire    Feeling of Stress : Only a little  Social Connections: Socially Isolated (04/07/2024)   Social Connection and Isolation Panel    Frequency of Communication with Friends and Family: Once a week    Frequency of Social Gatherings with Friends and Family:  Once a week    Attends Religious Services: Never    Database Administrator or Organizations: No    Attends Banker Meetings: Never    Marital Status: Widowed   Family History  Problem Relation Age of Onset   Cancer Mother    Alzheimer's disease Maternal Aunt    Anesthesia problems Neg Hx    No Known Allergies   Positive ROS: All other systems have been  reviewed and were otherwise negative with the exception of those mentioned in the HPI and as above.  Physical Exam: General: Alert, no acute distress Cardiovascular: No pedal edema Respiratory: No cyanosis, no use of accessory musculature Skin: No lesions in the area of chief complaint Neurologic: Sensation intact distally  MUSCULOSKELETAL:  LLE No traumatic wounds, ecchymosis, or rash  Nontender  groin pain with log roll  No knee or ankle effusion  Knee stable to varus/ valgus stress  Sens DPN, SPN, TN intact  Motor EHL, ext, flex 5/5  No significant edema   IMAGING: X-rays and CT scan left hip reviewed demonstrate a minimally displaced valgus impacted femoral neck fracture  Assessment: Principal Problem:   Dehydration Active Problems:   Type 2 diabetes mellitus with hyperlipidemia (HCC)   Obesity (BMI 30-39.9)   Gout   HTN (hypertension), benign   Chronic kidney disease, stage 3a (HCC)   CAD (coronary artery disease)   UTI (urinary tract infection)   Subdural hematoma (HCC)   Depression  Left valgus impacted femoral neck fracture  Plan: X-rays and CT scan were reviewed and discussed with the patient's daughter over the phone.  He has a mildly valgus displaced femoral neck fracture.  He was seen by cardiology yesterday who felt given his multiple comorbidities he would be a poor candidate for any type of surgical intervention.  I discussed with the daughter that cannulated screw fixation is an approximately 20-minute surgery with the goal is to stabilize the fracture for pain control and mobility.   With the current fracture I do not think he would be safe to mobilize and any mobility would put him at risk for displacement of the fracture, thus leaving him bedbound.  Will reach out to my anesthesia colleagues to review his chart and then again to cardiology regarding possibility of going through a cannulated hip pinning for this fracture.  Patient's daughter expresses understanding and agreement with the plan.    Ricky DELENA HIGASHI, MD  Contact information:   Tzzxijbd 7am-5pm epic message Dr. Higashi, or call office for patient follow up: [phone] After hours and holidays please check [email or web address] for group call information for [facility]

## 2024-04-09 NOTE — Progress Notes (Signed)
 OT Cancellation Note  Patient Details Name: SYNCERE EBLE MRN: [id number] DOB: 07-06-1930   Cancelled Treatment:    Reason Eval/Treat Not Completed: Medical issues which prohibited therapy (Ortho recommending surgical repair of L hip fx prior to mobilization. Awaiting further determination to see if pt is a surgical candidate. Will continue efforts as able/as appropriate.)   Sammy Douthitt D., MSOT, OTR/L Acute Rehabilitation Services [phone] Secure Chat Preferred  Tulip Meharg 04/09/2024, 1:41 PM

## 2024-04-09 NOTE — Plan of Care (Signed)
 [name] he is happy to find out that he has a broken hip because he can get it fixed and go back to his normal life at home.  [name] he was wondering because he knew he could not walk with his walker like he was doing around the house previously.  Problem: Fluid Volume: Goal: Hemodynamic stability will improve Outcome: Progressing   Problem: Clinical Measurements: Goal: Diagnostic test results will improve Outcome: Progressing   Problem: Respiratory: Goal: Ability to maintain adequate ventilation will improve Outcome: Progressing   Problem: Coping: Goal: Ability to adjust to condition or change in health will improve Outcome: Progressing   Problem: Nutritional: Goal: Maintenance of adequate nutrition will improve Outcome: Progressing   Problem: Skin Integrity: Goal: Risk for impaired skin integrity will decrease Outcome: Progressing   Problem: Tissue Perfusion: Goal: Adequacy of tissue perfusion will improve Outcome: Progressing   Problem: Clinical Measurements: Goal: Ability to maintain clinical measurements within normal limits will improve Outcome: Progressing   Problem: Activity: Goal: Risk for activity intolerance will decrease Outcome: Progressing   Problem: Coping: Goal: Level of anxiety will decrease Outcome: Progressing

## 2024-04-09 NOTE — Progress Notes (Signed)
 [facility]  PROGRESS NOTE  QUANTRELL SPLITT FMW:[id number] DOB: [birth year] DOA: 04/06/2024 PCP: Maree Leni Edyth DELENA, MD   Brief HPI:   89 yo male with the past medical history of coronary artery disease, heart failure, CKD and T2DM who presented after a mechanical fall. On the day of hospitalization he was diagnosed with urinary tract infection by his primary care provider. Apparently at home he had not being eating well, very poor oral intake, and having hallucinations. At home sustained a mechanical fall while trying to put is clothes, leaned forward to far. No head trauma, landing over his left side.  EMS was called, he was found with blood pressure 108/60, he was placed on 2 L/min per [place] with 02 saturation 95%, then transported to the ED.  On his initial physical examination in the ED, his blood pressure was 64/48, placed on peripheral norepinephrine with blood pressure 111/80, HR 83, RR 17 and 02 saturation 85% on room air    CT cervical spine with no acute fracture or traumatic malalignment.  CT chest abdomen and pelvis with no acute traumatic injury identified in the chest, abdomen or pelvis.  No acute rib fracture identified.  CT head with thin subdural hematoma over the left frontal convexity without mass effect.  Mild chronic small vessel ischemic disease.     Assessment/Plan:    Left hip pain/subcapital left femoral neck fracture -Patient complained of left hip pain last night, x-ray of hip with pelvis on left done this morning showed findings suggesting subcapital left femoral neck fracture - CT hip obtained showed impacted subcapital fracture of the left femoral head- neck junction -Cardiology was consulted for preop evaluation, cardiology did not recommend surgery given multiple comorbidities and significant cardiac history -Orthopedic surgery consulted, Dr. Edna recommends cannulated screw fixation which is 20 minutes surgery, goal to stabilize the fracture for pain  control and mobility - Family has decided to go ahead with the procedure   Preop evaluation/history of CAD/chronic combined systolic and diastolic CHF - Patient has significant history of CAD, multivessel disease, STEMI in 2019, treated with DES x 3 to proximal left circumflex, proximal OMI and distal OMI with PCI to mid distal RCA with DES x 2 - Also has combined systolic and diastolic heart failure with last echo from 2019 showed EF of 40 to 45%; patient's BNP obtained today was 125 - Does not appear to be in acute heart failure -Revised cardiac risk index score of 3; 10% risk of major cardiac event -Cardiology was consulted; deemed patient not a candidate for surgery given significant cardiac history and multiple comorbidities - Patient however will undergo procedure as above  Subdural hematoma -Repeat CT head with unchanged ten 2 mm thick subdural hematoma along the left cerebral convexity, no mass effect - Conservative management per neurosurgery,  Hypotension - Patient had severe hypotension requiring vasopressors; likely in setting of dehydration - No clinical signs of sepsis - He is now off vasopressors - Echocardiogram has been ordered; will follow the result  Hypertension - Patient blood pressure is now elevated - Also found to have sinus tachycardia - He was taking metoprolol  at home, will restart metoprolol  25 mg daily  CKD stage IIIa - Creatinine has improved with IV fluids -Creatinine is 1.17  UTI - Started on Rocephin  - Urine culture from 04/06/2024 showed no growth - Rocephin  has been discontinued  Diabetes mellitus type 2 - Continue sliding scale insulin  NovoLog  - CBG well-controlled    DVT prophylaxis: SCDs  Medications     atorvastatin   80 mg Oral Daily   Chlorhexidine  Gluconate Cloth  6 each Topical Daily   feeding supplement  237 mL Oral BID BM   insulin  aspart  0-15 Units Subcutaneous TID WC   metoprolol  succinate  25 mg Oral Daily   sertraline    50 mg Oral QHS     Data Reviewed:   CBG:  Recent Labs  Lab 04/08/24 0617 04/08/24 1156 04/08/24 1701 04/08/24 2138 04/09/24 0626  GLUCAP 116* 119* 151* 140* 106*    SpO2: 96 % O2 Flow Rate (L/min): 2 L/min    Vitals:   04/09/24 0019 04/09/24 0329 04/09/24 0814 04/09/24 1025  BP: 122/87 111/71 119/76 106/61  Pulse: 94 80 99 94  Resp: 19 18 18    Temp: 98 F (36.7 C) 98.7 F (37.1 C) 98.6 F (37 C)   TempSrc:  Oral Oral   SpO2: 96% 92% 97% 96%  Weight:      Height:          Data Reviewed:  Basic Metabolic Panel: Recent Labs  Lab 04/06/24 1130 04/07/24 0120 04/08/24 0536  NA 138 136 138  K 3.9 4.3 3.6  CL 107 102 105  CO2 21* 23 21*  GLUCOSE 122* 122* 118*  BUN 20 18 13   CREATININE 1.63* 1.53* 1.17  CALCIUM  8.8* 8.4* 8.5*  MG  --   --  1.8  PHOS  --  3.7  --     CBC: Recent Labs  Lab 04/06/24 1130 04/07/24 0120 04/08/24 0156  WBC 6.9 8.5 7.6  NEUTROABS 4.8  --   --   HGB 13.5 12.7* 12.7*  HCT 41.4 39.2 38.9*  MCV 89.2 89.5 89.0  PLT 203 156 126*    LFT Recent Labs  Lab 04/06/24 1130 04/07/24 0120  AST 27  --   ALT 25  --   ALKPHOS 71  --   BILITOT 0.9  --   PROT 7.0  --   ALBUMIN 3.3* 3.0*     Antibiotics: Anti-infectives (From admission, onward)    Start     Dose/Rate Route Frequency Ordered Stop   04/07/24 1215  cefTRIAXone  (ROCEPHIN ) 1 g in sodium chloride  0.9 % 100 mL IVPB  Status:  Discontinued        1 g 200 mL/hr over 30 Minutes Intravenous Every 24 hours 04/07/24 1205 04/08/24 1651   04/06/24 1300  vancomycin  (VANCOREADY) IVPB 2000 mg/400 mL        2,000 mg 200 mL/hr over 120 Minutes Intravenous  Once 04/06/24 1250 04/06/24 1645   04/06/24 1300  piperacillin -tazobactam (ZOSYN ) IVPB 3.375 g        3.375 g 100 mL/hr over 30 Minutes Intravenous  Once 04/06/24 1250 04/06/24 1612   04/06/24 1145  cefTRIAXone  (ROCEPHIN ) 2 g in sodium chloride  0.9 % 100 mL IVPB  Status:  Discontinued        2 g 200 mL/hr over 30 Minutes  Intravenous Every 24 hours 04/06/24 1137 04/06/24 1745        CONSULTS neurosurgery  Code Status: Full code  Family Communication: No family at bedside     Subjective   Appreciate cardiology and orthopedics input.  Cardiology deemed patient is not stable for orthopedic surgery   Objective    Physical Examination:   Appears in no acute distress S1-S2, regular, no murmur auscultated Lungs clear to auscultation bilaterally Abdomen is soft, nontender Extremities tenderness in left hip region   Status is:  Inpatient:             [name]   [facility] If 7PM-7AM, please contact night-coverage at [email or web address], Office  [phone]   04/09/2024, 10:39 AM  LOS: 3 days

## 2024-04-09 NOTE — Progress Notes (Signed)
 Had a moment of confusion and had removed O2 and was desating in the mid 80.  Had pulled out his IV's.  [place] he was told to take them out because he was going home.  IV was replaced and he was reminded that he is not going home yet and to leave the O2 on.  He is satting back at 98% and no longer confused at this time.  IV was wrapped and he is back to his baseline.

## 2024-04-09 NOTE — Progress Notes (Signed)
 PT Cancellation Note  Patient Details Name: [name] MRN: [id number] DOB: [birth year]   Cancelled Treatment:    Reason Eval/Treat Not Completed: Medical issues which prohibited therapy. Ortho recommending surgical repair of L hip fx prior to mobilization. Awaiting further determination to see if pt is a surgical candidate. PT to follow.   [name] 04/09/2024, 11:54 AM

## 2024-04-09 NOTE — Progress Notes (Signed)
 Discussed the patient's case today with Dr. Delford and Dr. Drusilla.  I felt that given his good response to resuscitation although significant medical comorbidities a cannulated hip pinning will allow for pain control, mobility, and better quality of life as opposed to nonoperative treatment which will essentially leave him bedbound and in pain.  Dr. Delford felt that this type of small procedure would be reasonable; albeit, he does have multiple concerning comorbidities and will be high risk for surgery regardless.  I explained all this to the patient's daughter who expressed understanding and agreement with the plan.  Patient is tentatively scheduled for surgery tomorrow afternoon.  He appears to have had an echo today with results pending.

## 2024-04-10 DIAGNOSIS — N1831 Chronic kidney disease, stage 3a: Secondary | ICD-10-CM | POA: Diagnosis not present

## 2024-04-10 DIAGNOSIS — I10 Essential (primary) hypertension: Secondary | ICD-10-CM | POA: Diagnosis not present

## 2024-04-10 DIAGNOSIS — S065XAA Traumatic subdural hemorrhage with loss of consciousness status unknown, initial encounter: Secondary | ICD-10-CM | POA: Diagnosis not present

## 2024-04-10 DIAGNOSIS — E860 Dehydration: Secondary | ICD-10-CM | POA: Diagnosis not present

## 2024-04-10 LAB — GLUCOSE, CAPILLARY
Glucose-Capillary: 92 mg/dL (ref 70–99)
Glucose-Capillary: 95 mg/dL (ref 70–99)
Glucose-Capillary: 180 mg/dL — ABNORMAL HIGH (ref 70–99)
Glucose-Capillary: 127 mg/dL — ABNORMAL HIGH (ref 70–99)

## 2024-04-10 LAB — SURGICAL PCR SCREEN
MRSA, PCR: NEGATIVE
Staphylococcus aureus: NEGATIVE

## 2024-04-10 MED ORDER — MUPIROCIN 2 % EX OINT: 1.0000 | TOPICAL_OINTMENT | Freq: Two times a day (BID) | CUTANEOUS | Status: DC

## 2024-04-10 NOTE — Progress Notes (Signed)
 [facility]  PROGRESS NOTE  [name] FMW:[id number] DOB: 08-May-1931 DOA: 04/06/2024 PCP: Maree Leni Edyth DELENA, MD   Brief HPI:   89 yo male with the past medical history of coronary artery disease, heart failure, CKD and T2DM who presented after a mechanical fall. On the day of hospitalization he was diagnosed with urinary tract infection by his primary care provider. Apparently at home he had not being eating well, very poor oral intake, and having hallucinations. At home sustained a mechanical fall while trying to put is clothes, leaned forward to far. No head trauma, landing over his left side.  EMS was called, he was found with blood pressure 108/60, he was placed on 2 L/min per [place] with 02 saturation 95%, then transported to the ED.  On his initial physical examination in the ED, his blood pressure was 64/48, placed on peripheral norepinephrine with blood pressure 111/80, HR 83, RR 17 and 02 saturation 85% on room air    CT cervical spine with no acute fracture or traumatic malalignment.  CT chest abdomen and pelvis with no acute traumatic injury identified in the chest, abdomen or pelvis.  No acute rib fracture identified.  CT head with thin subdural hematoma over the left frontal convexity without mass effect.  Mild chronic small vessel ischemic disease.     Assessment/Plan:    Left hip pain/subcapital left femoral neck fracture -Patient complained of left hip pain last night, x-ray of hip with pelvis on left done this morning showed findings suggesting subcapital left femoral neck fracture - CT hip obtained showed impacted subcapital fracture of the left femoral head- neck junction -Cardiology was consulted for preop evaluation, cardiology did not recommend surgery given multiple comorbidities and significant cardiac history -Orthopedic surgery consulted, Dr. Edna recommends cannulated screw fixation which is 20 minutes surgery, goal to stabilize the fracture for pain  control and mobility - Family has decided to go ahead with the procedure - Plan for surgery today   Preop evaluation/history of CAD/chronic combined systolic and diastolic CHF - Patient has significant history of CAD, multivessel disease, STEMI in 2019, treated with DES x 3 to proximal left circumflex, proximal OMI and distal OMI with PCI to mid distal RCA with DES x 2 - Also has combined systolic and diastolic heart failure with last echo from 2019 showed EF of 40 to 45%; patient's BNP obtained today was 125 - Does not appear to be in acute heart failure -Revised cardiac risk index score of 3; 10% risk of major cardiac event -Cardiology was consulted; deemed patient not a candidate for surgery given significant cardiac history and multiple comorbidities - Patient however will undergo procedure as above  Subdural hematoma -Repeat CT head with unchanged ten 2 mm thick subdural hematoma along the left cerebral convexity, no mass effect - Conservative management per neurosurgery,  Hypotension - Patient had severe hypotension requiring vasopressors; likely in setting of dehydration - No clinical signs of sepsis - He is now off vasopressors - Echocardiogram has been ordered; will follow the result  Hypertension - Patient blood pressure is now elevated - Also found to have sinus tachycardia - He was taking metoprolol  at home,  - Started back on  metoprolol  25 mg daily  CKD stage IIIa - Creatinine has improved with IV fluids -Creatinine is 1.17  UTI - Started on Rocephin  - Urine culture from 04/06/2024 showed no growth - Rocephin  has been discontinued  Diabetes mellitus type 2 - Continue sliding scale insulin  NovoLog  -  CBG well-controlled    DVT prophylaxis: SCDs  Medications     atorvastatin   80 mg Oral Daily   Chlorhexidine  Gluconate Cloth  6 each Topical Daily   feeding supplement  237 mL Oral BID BM   insulin  aspart  0-15 Units Subcutaneous TID WC   metoprolol   succinate  25 mg Oral Daily   sertraline   50 mg Oral QHS     Data Reviewed:   CBG:  Recent Labs  Lab 04/09/24 0626 04/09/24 1304 04/09/24 1706 04/09/24 2117 04/10/24 0613  GLUCAP 106* 148* 98 105* 92    SpO2: 98 % O2 Flow Rate (L/min): 3 L/min    Vitals:   04/09/24 2344 04/10/24 0358 04/10/24 0600 04/10/24 0741  BP: 122/71 120/80  116/75  Pulse: 75 77  85  Resp: 16 16  20   Temp: 98.8 F (37.1 C) 98.7 F (37.1 C)  98.3 F (36.8 C)  TempSrc: Axillary Axillary  Oral  SpO2: 94% 99%  98%  Weight:   85.7 kg   Height:          Data Reviewed:  Basic Metabolic Panel: Recent Labs  Lab 04/06/24 1130 04/07/24 0120 04/08/24 0536  NA 138 136 138  K 3.9 4.3 3.6  CL 107 102 105  CO2 21* 23 21*  GLUCOSE 122* 122* 118*  BUN 20 18 13   CREATININE 1.63* 1.53* 1.17  CALCIUM  8.8* 8.4* 8.5*  MG  --   --  1.8  PHOS  --  3.7  --     CBC: Recent Labs  Lab 04/06/24 1130 04/07/24 0120 04/08/24 0156  WBC 6.9 8.5 7.6  NEUTROABS 4.8  --   --   HGB 13.5 12.7* 12.7*  HCT 41.4 39.2 38.9*  MCV 89.2 89.5 89.0  PLT 203 156 126*    LFT Recent Labs  Lab 04/06/24 1130 04/07/24 0120  AST 27  --   ALT 25  --   ALKPHOS 71  --   BILITOT 0.9  --   PROT 7.0  --   ALBUMIN 3.3* 3.0*     Antibiotics: Anti-infectives (From admission, onward)    Start     Dose/Rate Route Frequency Ordered Stop   04/07/24 1215  cefTRIAXone  (ROCEPHIN ) 1 g in sodium chloride  0.9 % 100 mL IVPB  Status:  Discontinued        1 g 200 mL/hr over 30 Minutes Intravenous Every 24 hours 04/07/24 1205 04/08/24 1651   04/06/24 1300  vancomycin  (VANCOREADY) IVPB 2000 mg/400 mL        2,000 mg 200 mL/hr over 120 Minutes Intravenous  Once 04/06/24 1250 04/06/24 1645   04/06/24 1300  piperacillin -tazobactam (ZOSYN ) IVPB 3.375 g        3.375 g 100 mL/hr over 30 Minutes Intravenous  Once 04/06/24 1250 04/06/24 1612   04/06/24 1145  cefTRIAXone  (ROCEPHIN ) 2 g in sodium chloride  0.9 % 100 mL IVPB  Status:   Discontinued        2 g 200 mL/hr over 30 Minutes Intravenous Every 24 hours 04/06/24 1137 04/06/24 1745        CONSULTS neurosurgery  Code Status: Full code  Family Communication: No family at bedside     Subjective   Denies any complaints.  Agrees for hip surgery.   Objective    Physical Examination:  Appears in no acute distress S1-S2, regular, no murmur auscultated Abdomen is soft, nontender, no organomegaly Extremities no edema  Status is: Inpatient:  [name]   [facility] If 7PM-7AM, please contact night-coverage at [email or web address], Office  [phone]   04/10/2024, 9:57 AM  LOS: 4 days

## 2024-04-10 NOTE — Plan of Care (Signed)
  Problem: Respiratory: Goal: Ability to maintain adequate ventilation will improve Outcome: Progressing   Problem: Clinical Measurements: Goal: Diagnostic test results will improve Outcome: Progressing Goal: Signs and symptoms of infection will decrease Outcome: Progressing   Problem: Health Behavior/Discharge Planning: Goal: Ability to identify and utilize available resources and services will improve Outcome: Progressing Goal: Ability to manage health-related needs will improve Outcome: Progressing

## 2024-04-10 NOTE — Care Management Important Message (Signed)
 Important Message  Patient Details  Name: [name] MRN: [id number] Date of Birth: 05/25/1931   Important Message Given:  Yes - Medicare IM     Claretta Deed 04/10/2024, 3:04 PM

## 2024-04-11 ENCOUNTER — Encounter (HOSPITAL_COMMUNITY)
Admission: EM | Disposition: A | Discharge status: Skilled Nursing Facility | Payer: Self-pay | Source: Home / Self Care | Attending: Family Medicine

## 2024-04-11 ENCOUNTER — Encounter (HOSPITAL_COMMUNITY): Payer: Self-pay | Admitting: Internal Medicine

## 2024-04-11 ENCOUNTER — Inpatient Hospital Stay (HOSPITAL_COMMUNITY)

## 2024-04-11 ENCOUNTER — Inpatient Hospital Stay (HOSPITAL_COMMUNITY): Payer: Self-pay | Admitting: Anesthesiology

## 2024-04-11 DIAGNOSIS — N1831 Chronic kidney disease, stage 3a: Secondary | ICD-10-CM | POA: Diagnosis not present

## 2024-04-11 DIAGNOSIS — E860 Dehydration: Secondary | ICD-10-CM | POA: Diagnosis not present

## 2024-04-11 DIAGNOSIS — S065XAA Traumatic subdural hemorrhage with loss of consciousness status unknown, initial encounter: Secondary | ICD-10-CM | POA: Diagnosis not present

## 2024-04-11 DIAGNOSIS — I509 Heart failure, unspecified: Secondary | ICD-10-CM | POA: Diagnosis not present

## 2024-04-11 DIAGNOSIS — S72002A Fracture of unspecified part of neck of left femur, initial encounter for closed fracture: Secondary | ICD-10-CM | POA: Diagnosis not present

## 2024-04-11 DIAGNOSIS — I110 Hypertensive heart disease with heart failure: Secondary | ICD-10-CM | POA: Diagnosis not present

## 2024-04-11 DIAGNOSIS — I10 Essential (primary) hypertension: Secondary | ICD-10-CM | POA: Diagnosis not present

## 2024-04-11 DIAGNOSIS — I2510 Atherosclerotic heart disease of native coronary artery without angina pectoris: Secondary | ICD-10-CM

## 2024-04-11 HISTORY — PX: HIP PINNING,CANNULATED: SHX1758

## 2024-04-11 LAB — GLUCOSE, CAPILLARY
Glucose-Capillary: 114 mg/dL — ABNORMAL HIGH (ref 70–99)
Glucose-Capillary: 137 mg/dL — ABNORMAL HIGH (ref 70–99)
Glucose-Capillary: 166 mg/dL — ABNORMAL HIGH (ref 70–99)
Glucose-Capillary: 189 mg/dL — ABNORMAL HIGH (ref 70–99)
Glucose-Capillary: 194 mg/dL — ABNORMAL HIGH (ref 70–99)

## 2024-04-11 LAB — CULTURE, BLOOD (ROUTINE X 2)
Culture: NO GROWTH
Culture: NO GROWTH
Special Requests: ADEQUATE
Special Requests: ADEQUATE

## 2024-04-11 SURGERY — FIXATION, FEMUR, NECK, PERCUTANEOUS, USING SCREW
Anesthesia: General | Site: Hip | Laterality: Left

## 2024-04-11 MED ORDER — CHLORHEXIDINE GLUCONATE 0.12 % MT SOLN
15.0000 mL | Freq: Once | OROMUCOSAL | Status: AC
  Administered 2024-04-11: 15 mL via OROMUCOSAL

## 2024-04-11 MED ORDER — CHLORHEXIDINE GLUCONATE 4 % EX SOLN
60.0000 mL | Freq: Once | CUTANEOUS | Status: DC
  Administered 2024-04-11: 4 via TOPICAL

## 2024-04-11 MED ORDER — PHENYLEPHRINE 80 MCG/ML (10ML) SYRINGE FOR IV PUSH (FOR BLOOD PRESSURE SUPPORT)
PREFILLED_SYRINGE | INTRAVENOUS | Status: AC
  Filled 2024-04-11: qty 10

## 2024-04-11 MED ORDER — CEFAZOLIN SODIUM-DEXTROSE 2-4 GM/100ML-% IV SOLN
2.0000 g | INTRAVENOUS | Status: AC
  Administered 2024-04-11: 2 g via INTRAVENOUS

## 2024-04-11 MED ORDER — FENTANYL CITRATE (PF) 100 MCG/2ML IJ SOLN
INTRAMUSCULAR | Status: AC
  Filled 2024-04-11: qty 2

## 2024-04-11 MED ORDER — PHENYLEPHRINE HCL-NACL 20-0.9 MG/250ML-% IV SOLN
INTRAVENOUS | Status: DC | PRN
  Administered 2024-04-11: 40 ug/min via INTRAVENOUS

## 2024-04-11 MED ORDER — ACETAMINOPHEN 10 MG/ML IV SOLN
1000.0000 mg | Freq: Once | INTRAVENOUS | Status: DC | PRN
  Administered 2024-04-11: 1000 mg via INTRAVENOUS

## 2024-04-11 MED ORDER — PROPOFOL 10 MG/ML IV BOLUS
INTRAVENOUS | Status: DC | PRN
  Administered 2024-04-11: 60 mg via INTRAVENOUS
  Administered 2024-04-11: 30 mg via INTRAVENOUS

## 2024-04-11 MED ORDER — TRANEXAMIC ACID-NACL 1000-0.7 MG/100ML-% IV SOLN
INTRAVENOUS | Status: AC
  Filled 2024-04-11: qty 100

## 2024-04-11 MED ORDER — FENTANYL CITRATE (PF) 100 MCG/2ML IJ SOLN
25.0000 ug | INTRAMUSCULAR | Status: DC | PRN
  Administered 2024-04-11: 25 ug via INTRAVENOUS
  Administered 2024-04-11: 50 ug via INTRAVENOUS
  Administered 2024-04-11: 25 ug via INTRAVENOUS

## 2024-04-11 MED ORDER — LIDOCAINE 2% (20 MG/ML) 5 ML SYRINGE
INTRAMUSCULAR | Status: DC | PRN
  Administered 2024-04-11: 40 mg via INTRAVENOUS

## 2024-04-11 MED ORDER — ROCURONIUM BROMIDE 10 MG/ML (PF) SYRINGE
PREFILLED_SYRINGE | INTRAVENOUS | Status: AC
  Filled 2024-04-11: qty 10

## 2024-04-11 MED ORDER — LACTATED RINGERS IV SOLN: INTRAVENOUS | Status: DC

## 2024-04-11 MED ORDER — INSULIN ASPART 100 UNIT/ML IJ SOLN: 0.0000 [IU] | INTRAMUSCULAR | Status: DC | PRN

## 2024-04-11 MED ORDER — VASOPRESSIN 20 UNIT/ML IV SOLN
INTRAVENOUS | Status: AC
  Filled 2024-04-11: qty 1

## 2024-04-11 MED ORDER — PROPOFOL 10 MG/ML IV BOLUS
INTRAVENOUS | Status: AC
  Filled 2024-04-11: qty 20

## 2024-04-11 MED ORDER — ROCURONIUM BROMIDE 10 MG/ML (PF) SYRINGE
PREFILLED_SYRINGE | INTRAVENOUS | Status: DC | PRN
  Administered 2024-04-11: 70 mg via INTRAVENOUS

## 2024-04-11 MED ORDER — DEXAMETHASONE SOD PHOSPHATE PF 10 MG/ML IJ SOLN
INTRAMUSCULAR | Status: DC | PRN
  Administered 2024-04-11: 5 mg via INTRAVENOUS

## 2024-04-11 MED ORDER — ONDANSETRON HCL 4 MG/2ML IJ SOLN
INTRAMUSCULAR | Status: DC | PRN
  Administered 2024-04-11: 4 mg via INTRAVENOUS

## 2024-04-11 MED ORDER — EPHEDRINE 5 MG/ML INJ
INTRAVENOUS | Status: AC
  Filled 2024-04-11: qty 5

## 2024-04-11 MED ORDER — POVIDONE-IODINE 10 % EX SWAB
2.0000 | Freq: Once | CUTANEOUS | Status: AC
  Administered 2024-04-11: 2 via TOPICAL

## 2024-04-11 MED ORDER — EPHEDRINE SULFATE (PRESSORS) 25 MG/5ML IV SOSY
PREFILLED_SYRINGE | INTRAVENOUS | Status: DC | PRN
  Administered 2024-04-11 (×2): 10 mg via INTRAVENOUS

## 2024-04-11 MED ORDER — LIDOCAINE 2% (20 MG/ML) 5 ML SYRINGE
INTRAMUSCULAR | Status: AC
  Filled 2024-04-11: qty 5

## 2024-04-11 MED ORDER — ONDANSETRON HCL 4 MG/2ML IJ SOLN
INTRAMUSCULAR | Status: AC
  Filled 2024-04-11: qty 2

## 2024-04-11 MED ORDER — PHENYLEPHRINE 80 MCG/ML (10ML) SYRINGE FOR IV PUSH (FOR BLOOD PRESSURE SUPPORT)
PREFILLED_SYRINGE | INTRAVENOUS | Status: DC | PRN
  Administered 2024-04-11: 80 ug via INTRAVENOUS
  Administered 2024-04-11: 160 ug via INTRAVENOUS
  Administered 2024-04-11: 240 ug via INTRAVENOUS

## 2024-04-11 MED ORDER — CHLORHEXIDINE GLUCONATE 0.12 % MT SOLN
OROMUCOSAL | Status: AC
  Filled 2024-04-11: qty 15

## 2024-04-11 MED ORDER — 0.9 % SODIUM CHLORIDE (POUR BTL) OPTIME
TOPICAL | Status: DC | PRN
  Administered 2024-04-11: 1000 mL

## 2024-04-11 MED ORDER — ACETAMINOPHEN 10 MG/ML IV SOLN
INTRAVENOUS | Status: AC
  Filled 2024-04-11: qty 100

## 2024-04-11 MED ORDER — CEFAZOLIN SODIUM-DEXTROSE 2-4 GM/100ML-% IV SOLN
2.0000 g | Freq: Three times a day (TID) | INTRAVENOUS | Status: AC
  Administered 2024-04-11 – 2024-04-12 (×2): 2 g via INTRAVENOUS
  Filled 2024-04-11 (×2): qty 100

## 2024-04-11 MED ORDER — ALBUMIN HUMAN 5 % IV SOLN
INTRAVENOUS | Status: DC | PRN
Start: 2024-04-11 — End: 2024-04-11

## 2024-04-11 MED ORDER — ORAL CARE MOUTH RINSE: 15.0000 mL | Freq: Once | OROMUCOSAL | Status: AC

## 2024-04-11 MED ORDER — CEFAZOLIN SODIUM-DEXTROSE 2-4 GM/100ML-% IV SOLN
INTRAVENOUS | Status: AC
Start: 2024-04-11 — End: 2024-04-11
  Filled 2024-04-11: qty 100

## 2024-04-11 MED ORDER — TRANEXAMIC ACID-NACL 1000-0.7 MG/100ML-% IV SOLN
1000.0000 mg | INTRAVENOUS | Status: AC
  Administered 2024-04-11: 1000 mg via INTRAVENOUS

## 2024-04-11 MED ORDER — SUGAMMADEX SODIUM 200 MG/2ML IV SOLN
INTRAVENOUS | Status: DC | PRN
  Administered 2024-04-11: 200 mg via INTRAVENOUS
  Administered 2024-04-11 (×2): 100 mg via INTRAVENOUS

## 2024-04-11 SURGICAL SUPPLY — 38 items
BAG COUNTER SPONGE SURGICOUNT (BAG) ×2 IMPLANT
BIT DRILL 4.8X200 CANN (BIT) IMPLANT
BRUSH SCRUB EZ PLAIN DRY (MISCELLANEOUS) ×2 IMPLANT
CHLORAPREP W/TINT 26 (MISCELLANEOUS) ×2 IMPLANT
CLSR STERI-STRIP ANTIMIC 1/2X4 (GAUZE/BANDAGES/DRESSINGS) IMPLANT
COVER SURGICAL LIGHT HANDLE (MISCELLANEOUS) ×2 IMPLANT
DERMABOND ADVANCED .7 DNX12 (GAUZE/BANDAGES/DRESSINGS) IMPLANT
DRAPE C-ARM 42X72 X-RAY (DRAPES) ×2 IMPLANT
DRAPE C-ARMOR (DRAPES) ×2 IMPLANT
DRAPE STERI IOBAN 125X83 (DRAPES) ×2 IMPLANT
DRAPE U-SHAPE 47X51 STRL (DRAPES) ×4 IMPLANT
DRESSING MEPILEX FLEX 4X4 (GAUZE/BANDAGES/DRESSINGS) IMPLANT
DRSG TEGADERM 4X4.75 (GAUZE/BANDAGES/DRESSINGS) ×2 IMPLANT
ELECT REM PT RETURN 15FT ADLT (MISCELLANEOUS) ×2 IMPLANT
GAUZE SPONGE 4X4 12PLY STRL (GAUZE/BANDAGES/DRESSINGS) ×2 IMPLANT
GLOVE BIO SURGEON STRL SZ 6.5 (GLOVE) ×6 IMPLANT
GLOVE BIOGEL PI IND STRL 6.5 (GLOVE) ×2 IMPLANT
GLOVE BIOGEL PI IND STRL 8 (GLOVE) ×2 IMPLANT
GLOVE SURG ORTHO LTX SZ8 (GLOVE) ×4 IMPLANT
GOWN STRL REUS W/ TWL LRG LVL3 (GOWN DISPOSABLE) ×2 IMPLANT
GOWN STRL REUS W/ TWL XL LVL3 (GOWN DISPOSABLE) ×4 IMPLANT
KIT BASIN OR (CUSTOM PROCEDURE TRAY) ×2 IMPLANT
KIT TURNOVER KIT B (KITS) ×2 IMPLANT
MANIFOLD NEPTUNE II (INSTRUMENTS) ×2 IMPLANT
PACK GENERAL/GYN (CUSTOM PROCEDURE TRAY) ×2 IMPLANT
PAD ARMBOARD POSITIONER FOAM (MISCELLANEOUS) ×4 IMPLANT
PIN GUIDE DRILL TIP 2.8X300 (DRILL) IMPLANT
SCREW 16MM THREAD 6.5X85MM (Screw) IMPLANT
SCREW CANN 6.5X90 16MM THD (Screw) IMPLANT
SCREW CANN THRD 6.5X80 (Screw) IMPLANT
SCREW CANNULATED 6.5X95 HIP (Screw) IMPLANT
SOLN 0.9% NACL POUR BTL 1000ML (IV SOLUTION) ×2 IMPLANT
SOLN STERILE WATER BTL 1000 ML (IV SOLUTION) ×2 IMPLANT
SUT MNCRL AB 3-0 PS2 18 (SUTURE) ×2 IMPLANT
SUT VIC AB 0 CT1 27XBRD ANBCTR (SUTURE) ×2 IMPLANT
SUT VIC AB 2-0 CT2 27 (SUTURE) ×2 IMPLANT
TOWEL GREEN STERILE FF (TOWEL DISPOSABLE) ×6 IMPLANT
WASHER FLAT 6.5MM (Washer) IMPLANT

## 2024-04-11 NOTE — Anesthesia Preprocedure Evaluation (Addendum)
 Anesthesia Evaluation  Patient identified by MRN, date of birth, ID band Patient awake    Reviewed: Allergy & Precautions, NPO status , Patient's Chart, lab work & pertinent test results  History of Anesthesia Complications Negative for: history of anesthetic complications  Airway Mallampati: III       Dental  (+) Edentulous Upper, Edentulous Lower, Dental Advisory Given   Pulmonary former smoker   breath sounds clear to auscultation       Cardiovascular hypertension, + CAD, + Past MI and +CHF   Rhythm:Regular  1. Left ventricular ejection fraction, by estimation, is 60 to 65%. The  left ventricle has normal function. The left ventricle has no regional  wall motion abnormalities. Left ventricular diastolic parameters are  indeterminate.   2. Right ventricular systolic function is normal. The right ventricular  size is mildly enlarged.   3. The mitral valve is abnormal. Trivial mitral valve regurgitation. No  evidence of mitral stenosis. Moderate mitral annular calcification.   4. The aortic valve is tricuspid. There is moderate calcification of the  aortic valve. Aortic valve regurgitation is not visualized. Mild aortic  valve stenosis. Aortic valve mean gradient measures 7.0 mmHg.    1st Diag lesion is 95% stenosed.  Mid LAD lesion is 70% stenosed.  Dist LAD lesion is 85% stenosed.  Previously placed 2nd Mrg stent (unknown type) is widely patent.  Previously placed Ost 2nd Mrg to 2nd Mrg stent (unknown type) is widely patent.  Previously placed Prox Cx to Mid Cx stent (unknown type) is widely patent.  Dist RCA lesion is 80% stenosed.  Post intervention, there is a 0% residual stenosis.  Prox RCA to Mid RCA lesion is 30% stenosed.  Mid RCA lesion is 95% stenosed.  A stent was successfully placed.  Post intervention, there is a 0% residual stenosis.  Post intervention, there is a 0% residual stenosis.  A stent  was successfully placed.    Neuro/Psych  PSYCHIATRIC DISORDERS  Depression    Subdural hematoma (HCC) CVA    GI/Hepatic Neg liver ROS, PUD,,,  Endo/Other  diabetes    Renal/GU CRFRenal disease     Musculoskeletal  (+) Arthritis ,    Abdominal   Peds  Hematology  (+) Blood dyscrasia, anemia Lab Results      Component                Value               Date                      WBC                      7.6                 04/08/2024                HGB                      12.7 (L)            04/08/2024                HCT                      38.9 (L)            04/08/2024  MCV                      89.0                04/08/2024                PLT                      126 (L)             04/08/2024              Anesthesia Other Findings   Reproductive/Obstetrics                              Anesthesia Physical Anesthesia Plan  ASA: 4  Anesthesia Plan: General   Post-op Pain Management: Minimal or no pain anticipated   Induction: Intravenous  PONV Risk Score and Plan: 2 and Ondansetron   Airway Management Planned: Oral ETT  Additional Equipment: None  Intra-op Plan:   Post-operative Plan: Extubation in OR  Informed Consent: I have reviewed the patients History and Physical, chart, labs and discussed the procedure including the risks, benefits and alternatives for the proposed anesthesia with the patient or authorized representative who has indicated his/her understanding and acceptance.     Dental advisory given  Plan Discussed with: CRNA  Anesthesia Plan Comments:          Anesthesia Quick Evaluation

## 2024-04-11 NOTE — Progress Notes (Signed)
 [facility]  PROGRESS NOTE  [name] FMW:[id number] DOB: [birth year] DOA: 04/06/2024 PCP: Maree Leni Edyth DELENA, MD   Brief HPI:   89 yo male with the past medical history of coronary artery disease, heart failure, CKD and T2DM who presented after a mechanical fall. On the day of hospitalization he was diagnosed with urinary tract infection by his primary care provider. Apparently at home he had not being eating well, very poor oral intake, and having hallucinations. At home sustained a mechanical fall while trying to put is clothes, leaned forward to far. No head trauma, landing over his left side.  EMS was called, he was found with blood pressure 108/60, he was placed on 2 L/min per [place] with 02 saturation 95%, then transported to the ED.  On his initial physical examination in the ED, his blood pressure was 64/48, placed on peripheral norepinephrine with blood pressure 111/80, HR 83, RR 17 and 02 saturation 85% on room air    CT cervical spine with no acute fracture or traumatic malalignment.  CT chest abdomen and pelvis with no acute traumatic injury identified in the chest, abdomen or pelvis.  No acute rib fracture identified.  CT head with thin subdural hematoma over the left frontal convexity without mass effect.  Mild chronic small vessel ischemic disease.     Assessment/Plan:    Left hip pain/subcapital left femoral neck fracture -Patient complained of left hip pain last night, x-ray of hip with pelvis on left done this morning showed findings suggesting subcapital left femoral neck fracture - CT hip obtained showed impacted subcapital fracture of the left femoral head- neck junction -Cardiology was consulted for preop evaluation, cardiology did not recommend surgery given multiple comorbidities and significant cardiac history -Orthopedic surgery consulted, Dr. Edna recommends cannulated screw fixation which is 20 minutes surgery, goal to stabilize the fracture for pain  control and mobility - Family has decided to go ahead with the procedure - Underwent fixation of femoral neck fracture using screw today   Preop evaluation/history of CAD/chronic combined systolic and diastolic CHF - Patient has significant history of CAD, multivessel disease, STEMI in 2019, treated with DES x 3 to proximal left circumflex, proximal OMI and distal OMI with PCI to mid distal RCA with DES x 2 - Also has combined systolic and diastolic heart failure with last echo from 2019 showed EF of 40 to 45%; patient's BNP obtained today was 125 - Does not appear to be in acute heart failure -Revised cardiac risk index score of 3; 10% risk of major cardiac event -Cardiology was consulted; deemed patient not a candidate for surgery given significant cardiac history and multiple comorbidities - Patient however will undergo procedure as above  Subdural hematoma -Repeat CT head with unchanged ten 2 mm thick subdural hematoma along the left cerebral convexity, no mass effect - Conservative management per neurosurgery,  Hypotension - Patient had severe hypotension requiring vasopressors; likely in setting of dehydration - No clinical signs of sepsis - He is now off vasopressors - Echocardiogram has been ordered; will follow the result  Hypertension - Patient blood pressure is now elevated - Also found to have sinus tachycardia - He was taking metoprolol  at home,  - Started back on  metoprolol  25 mg daily  CKD stage IIIa - Creatinine has improved with IV fluids -Creatinine is 1.17  UTI - Started on Rocephin  - Urine culture from 04/06/2024 showed no growth - Rocephin  has been discontinued  Diabetes mellitus type 2 -  Continue sliding scale insulin  NovoLog  - CBG well-controlled    DVT prophylaxis: SCDs  Medications     atorvastatin   80 mg Oral Daily   Chlorhexidine  Gluconate Cloth  6 each Topical Daily   feeding supplement  237 mL Oral BID BM   insulin  aspart  0-15 Units  Subcutaneous TID WC   metoprolol  succinate  25 mg Oral Daily   sertraline   50 mg Oral QHS     Data Reviewed:   CBG:  Recent Labs  Lab 04/10/24 1119 04/10/24 1648 04/10/24 2139 04/11/24 0622 04/11/24 0917  GLUCAP 95 180* 127* 114* 137*    SpO2: 92 % O2 Flow Rate (L/min): 2 L/min    Vitals:   04/11/24 0521 04/11/24 0732 04/11/24 0915 04/11/24 0930  BP:  115/68 117/78 112/81  Pulse:  72 86 84  Resp:  14 13 15   Temp:  98.3 F (36.8 C) (!) 97.5 F (36.4 C)   TempSrc:  Oral    SpO2:   98% 92%  Weight: 84.6 kg     Height:          Data Reviewed:  Basic Metabolic Panel: Recent Labs  Lab 04/06/24 1130 04/07/24 0120 04/08/24 0536  NA 138 136 138  K 3.9 4.3 3.6  CL 107 102 105  CO2 21* 23 21*  GLUCOSE 122* 122* 118*  BUN 20 18 13   CREATININE 1.63* 1.53* 1.17  CALCIUM  8.8* 8.4* 8.5*  MG  --   --  1.8  PHOS  --  3.7  --     CBC: Recent Labs  Lab 04/06/24 1130 04/07/24 0120 04/08/24 0156  WBC 6.9 8.5 7.6  NEUTROABS 4.8  --   --   HGB 13.5 12.7* 12.7*  HCT 41.4 39.2 38.9*  MCV 89.2 89.5 89.0  PLT 203 156 126*    LFT Recent Labs  Lab 04/06/24 1130 04/07/24 0120  AST 27  --   ALT 25  --   ALKPHOS 71  --   BILITOT 0.9  --   PROT 7.0  --   ALBUMIN 3.3* 3.0*     Antibiotics: Anti-infectives (From admission, onward)    Start     Dose/Rate Route Frequency Ordered Stop   04/11/24 0715  ceFAZolin  (ANCEF ) IVPB 2g/100 mL premix        2 g 200 mL/hr over 30 Minutes Intravenous On call to O.R. 04/11/24 9287 04/11/24 0823   04/07/24 1215  cefTRIAXone  (ROCEPHIN ) 1 g in sodium chloride  0.9 % 100 mL IVPB  Status:  Discontinued        1 g 200 mL/hr over 30 Minutes Intravenous Every 24 hours 04/07/24 1205 04/08/24 1651   04/06/24 1300  vancomycin  (VANCOREADY) IVPB 2000 mg/400 mL        2,000 mg 200 mL/hr over 120 Minutes Intravenous  Once 04/06/24 1250 04/06/24 1645   04/06/24 1300  piperacillin -tazobactam (ZOSYN ) IVPB 3.375 g        3.375 g 100  mL/hr over 30 Minutes Intravenous  Once 04/06/24 1250 04/06/24 1612   04/06/24 1145  cefTRIAXone  (ROCEPHIN ) 2 g in sodium chloride  0.9 % 100 mL IVPB  Status:  Discontinued        2 g 200 mL/hr over 30 Minutes Intravenous Every 24 hours 04/06/24 1137 04/06/24 1745        CONSULTS neurosurgery  Code Status: Full code  Family Communication: No family at bedside     Subjective   Patient seen and examined, underwent surgery  this morning.  Pain well-controlled   Objective    Physical Examination:  Appears in no acute distress Abdomen is soft, nontender Extremities, mild tenderness in left hip  Status is: Inpatient:             [name]   [facility] If 7PM-7AM, please contact night-coverage at [email or web address], Office  [phone]   04/11/2024, 10:12 AM  LOS: 5 days

## 2024-04-11 NOTE — Interval H&P Note (Signed)
 The patient has been re-examined, and the chart reviewed, and there have been no interval changes to the documented history and physical.    Plan for L hip perc screw fixation of femoral neck fracture  The operative side was examined and the patient was confirmed to have sensation to DPN, SPN, TN intact, Motor EHL, ext, flex 5/5, and DP 2+, PT 2+, No significant edema.   The risks, benefits, and alternatives have been discussed at length with patient and his son Ozell this morning, and the patient is willing to proceed.  Left hip marked. Consent has been signed.

## 2024-04-11 NOTE — Progress Notes (Signed)
 PT Cancellation Note  Patient Details Name: [name] MRN: [id number] DOB: 05-Jun-1930   Cancelled Treatment:    Reason Eval/Treat Not Completed: Patient at procedure or test/unavailable;Other (comment) pt to OR in AM, and when PT checked in PM, pt with family present and would like PT to attempt tomorrow. Will continue to follow and evaluate as able tomorrow.   Izetta Call, PT, DPT   Acute Rehabilitation Department Office [phone] Secure Chat Communication Preferred   [name] 04/11/2024, 3:50 PM

## 2024-04-11 NOTE — Op Note (Signed)
 04/06/2024 - 04/11/2024  9:08 AM  PATIENT:  [name]    PRE-OPERATIVE DIAGNOSIS:  Left Hip Fracture  POST-OPERATIVE DIAGNOSIS:  Same  PROCEDURE:  FIXATION, FEMUR, NECK, PERCUTANEOUS, USING SCREW  SURGEON:  Jeslyn Amsler A Chaise Mahabir, MD  PHYSICIAN ASSISTANT: none  ANESTHESIA:   General  ESTIMATED BLOOD LOSS: 25cc.  PREOPERATIVE INDICATIONS:  [name] is a  89 y.o. male who fell and was found to have a diagnosis of Left Hip Fracture who elected for surgical management.    The risks benefits and alternatives were discussed with the patient preoperatively including but not limited to the risks of infection, bleeding, nerve injury, cardiopulmonary complications, blood clots, malunion, nonunion, avascular necrosis, the need for revision surgery, the potential for conversion to hemiarthroplasty, among others, and the patient was willing to proceed.  OPERATIVE IMPLANTS: Implant Name Type Inv. Item Serial No. Manufacturer Lot No. LRB No. Used Action  SCREW CANNULATED 6.5X95 HIP - [id number] Screw SCREW CANNULATED 6.5X95 HIP  ZIMMER [employer]ON(ORTH,TRAU,BIO,SG)  Left 1 Implanted  WASHER FLAT 6.5MM - [id number] Washer WASHER FLAT 6.5MM  [employer]MER [employer]ON(ORTH,TRAU,BIO,SG)  Left 2 Implanted  SCREW CANN THRD 6.5X80 - [id number] Screw SCREW CANN THRD 6.5X80  ZIMMER [employer]ON(ORTH,TRAU,BIO,SG)  Left 1 Implanted  SCREW CANN 6.5X90 THD - [id number] Screw SCREW CANN 6.5X90 THD  [employer]IMMER [employer]ON(ORTH,TRAU,BIO,SG)  Left 1 Implanted    OPERATIVE FINDINGS: Clinical osteoporosis with weak bone, proximal femur  OPERATIVE PROCEDURE: The patient was brought to the operating room and placed in supine position. IV antibiotics were given. General anesthesia administered. Foley was also given. The patient was placed on the fracture table. The operative extremity was positioned, without any significant reduction maneuver and was prepped and draped in usual sterile fashion.  Time out was performed.  Small  incision was made distal to the greater trochanter, and 3 guidewires were introduced Into an inverted triangle configuration. The lengths were measured. The reduction was slightly valgus, and near-anatomic. I opened the cortex with a cannulated drill, and then placed the screws into position. Satisfactory fixation was achieved.  The wounds were irrigated copiously, and repaired with Vicryl, monocryl, dermabond and mepilex. There no complications and the patient tolerated the procedure well.  Post op recs: WB: WBAT LLE Abx: ancef  x23 hours post op Imaging: PACU xrays Dressing: keep intact until follow up, change PRN if soiled or saturated. DVT prophylaxis: hold given hx of subdural Follow up: 2 weeks after surgery for a wound check with Dr. Edna at [facility].  Address: [address], [place], [place] [address]  Office Phone: [phone]   Toribio Edna, MD

## 2024-04-11 NOTE — Transfer of Care (Signed)
 Immediate Anesthesia Transfer of Care Note  Patient: [name]  Procedure(s) Performed: FIXATION, FEMUR, NECK, PERCUTANEOUS, USING SCREW (Left: Hip)  Patient Location: PACU  Anesthesia Type:General  Level of Consciousness: drowsy  Airway & Oxygen Therapy: Patient Spontanous Breathing and Patient connected to nasal cannula oxygen  Post-op Assessment: Report given to RN and Post -op Vital signs reviewed and stable  Post vital signs: Reviewed and stable  Last Vitals:  Vitals Value Taken Time  BP 117/78 04/11/24 09:15  Temp 36.4 C 04/11/24 09:15  Pulse 82 04/11/24 09:20  Resp 15 04/11/24 09:20  SpO2 95 % 04/11/24 09:20  Vitals shown include unfiled device data.  Last Pain:  Vitals:   04/11/24 0732  TempSrc: Oral  PainSc: 0-No pain      Patients Stated Pain Goal: 2 (04/09/24 1600)  Complications: No notable events documented.

## 2024-04-11 NOTE — Progress Notes (Signed)
 OT Cancellation Note  Patient Details Name: [name] MRN: [id number] DOB: 1931/01/30   Cancelled Treatment:    Reason Eval/Treat Not Completed: Patient at procedure or test/ unavailable. Pt off the floor for surgery, will continue efforts.   Tatsuya Okray D., MSOT, OTR/L Acute Rehabilitation Services [phone] Secure Chat Preferred  [name] 04/11/2024, 7:11 AM

## 2024-04-11 NOTE — Progress Notes (Signed)
 Patient off the floor for surgery.

## 2024-04-11 NOTE — Discharge Instructions (Signed)
 Orthopedic Discharge Instructions  Diet: As you were doing prior to hospitalization   Shower:  May shower but keep the wounds dry, use an occlusive plastic wrap, NO SOAKING IN TUB.  If the bandage gets wet, change with a clean dry gauze.  If you have a splint on, leave the splint in place and keep the splint dry with a plastic bag.  Dressing:  You may change your dressing 3-5 days after surgery, unless you have a splint.  If the dressing remains clean and dry it can also be left on until follow up. If you change the dressing replace with clean gauze and tape or ace wrap. If you have a splint, then just leave the splint in place and we will change your bandages during your first follow-up appointment.  If water gets in the splint or the splint gets saturated please call the clinic and we can see you to change your splint.  If you had hand or foot surgery, we will plan to remove your stitches in about 2 weeks in the office.  For all other surgeries, there are sticky tapes (steri-strips) on your wounds and all the stitches are absorbable.  Leave the steri-strips in place when changing your dressings, they will peel off with time, usually 2-3 weeks.  Activity:  Increase activity slowly as tolerated, but follow the weight bearing instructions below.  The rules on driving is that you can not be taking narcotics while you drive, and you must feel in control of the vehicle.    Weight Bearing:   Weight bearing as tolerated.    To prevent constipation: you may use a stool softener such as -  Colace (over the counter) 100 mg by mouth twice a day  Drink plenty of fluids (prune juice may be helpful) and high fiber foods Miralax (over the counter) for constipation as needed.    Itching:  If you experience itching with your medications, try taking only a single pain pill, or even half a pain pill at a time.  You may take up to 10 pain pills per day, and you can also use benadryl over the counter for itching or  also to help with sleep.   Precautions:  If you experience chest pain or shortness of breath - call 911 immediately for transfer to the hospital emergency department!!   Call office [phone]) for the following: Temperature greater than 101F Persistent nausea and vomiting Severe uncontrolled pain Redness, tenderness, or signs of infection (pain, swelling, redness, odor or green/yellow discharge around the site) Difficulty breathing, headache or visual disturbances Hives Persistent dizziness or light-headedness Extreme fatigue Any other questions or concerns you may have after discharge  In an emergency, call 911 or go to an Emergency Department at a nearby hospital  Follow- Up Appointment:  Please call for an appointment to be seen approximately 2-3 week after surgery in [facility] with your surgeon Dr. Toribio Higashi - [phone] Address: [address], [place], [place] [address]

## 2024-04-11 NOTE — Anesthesia Procedure Notes (Signed)
 Procedure Name: Intubation Date/Time: 04/11/2024 7:52 AM  Performed by: Roddie Grate, CRNAPre-anesthesia Checklist: Patient identified, Emergency Drugs available, Suction available, Patient being monitored and Timeout performed Patient Re-evaluated:Patient Re-evaluated prior to induction Oxygen Delivery Method: Circle system utilized Preoxygenation: Pre-oxygenation with 100% oxygen Induction Type: IV induction Ventilation: Mask ventilation without difficulty and Oral airway inserted - appropriate to patient size Laryngoscope Size: Mac and 4 Grade View: Grade I Tube type: Oral Tube size: 7.5 mm Number of attempts: 1 Airway Equipment and Method: Stylet Placement Confirmation: ETT inserted through vocal cords under direct vision, positive ETCO2 and breath sounds checked- equal and bilateral Secured at: 23 cm Tube secured with: Tape Dental Injury: Teeth and Oropharynx as per pre-operative assessment  Comments: Smooth IV Induction. Eyes taped. Easy mask w/ OA. DL x 1 with grade 1 view. Atraumatically placed, teeth and lip remain intact as pre-op. Secured with tape. Bilateral breath sounds +/=, EtCO2 +, Adequate TV.

## 2024-04-12 DIAGNOSIS — N1831 Chronic kidney disease, stage 3a: Secondary | ICD-10-CM | POA: Diagnosis not present

## 2024-04-12 DIAGNOSIS — E860 Dehydration: Secondary | ICD-10-CM | POA: Diagnosis not present

## 2024-04-12 DIAGNOSIS — S065XAA Traumatic subdural hemorrhage with loss of consciousness status unknown, initial encounter: Secondary | ICD-10-CM | POA: Diagnosis not present

## 2024-04-12 DIAGNOSIS — I10 Essential (primary) hypertension: Secondary | ICD-10-CM | POA: Diagnosis not present

## 2024-04-12 LAB — GLUCOSE, CAPILLARY
Glucose-Capillary: 132 mg/dL — ABNORMAL HIGH (ref 70–99)
Glucose-Capillary: 136 mg/dL — ABNORMAL HIGH (ref 70–99)
Glucose-Capillary: 136 mg/dL — ABNORMAL HIGH (ref 70–99)
Glucose-Capillary: 159 mg/dL — ABNORMAL HIGH (ref 70–99)

## 2024-04-12 LAB — TROPONIN I (HIGH SENSITIVITY)
Troponin I (High Sensitivity): 12 ng/L (ref <18)
Troponin I (High Sensitivity): 12 ng/L (ref <18)

## 2024-04-12 MED ORDER — BENZOCAINE 10 % MT GEL
Freq: Three times a day (TID) | OROMUCOSAL | Status: DC | PRN
Start: 2024-04-12 — End: 2024-04-16
  Filled 2024-04-12: qty 9

## 2024-04-12 MED ORDER — SODIUM CHLORIDE 0.9 % IV BOLUS
250.0000 mL | Freq: Once | INTRAVENOUS | Status: AC
  Administered 2024-04-12: 250 mL via INTRAVENOUS

## 2024-04-12 MED ORDER — MORPHINE SULFATE (PF) 2 MG/ML IV SOLN
2.0000 mg | INTRAVENOUS | Status: DC | PRN
  Administered 2024-04-12: 2 mg via INTRAVENOUS
  Filled 2024-04-12: qty 1

## 2024-04-12 MED ORDER — NITROGLYCERIN 0.4 MG SL SUBL
0.4000 mg | SUBLINGUAL_TABLET | SUBLINGUAL | Status: DC | PRN
  Administered 2024-04-12: 0.4 mg via SUBLINGUAL
  Filled 2024-04-12: qty 1

## 2024-04-12 NOTE — Evaluation (Addendum)
 Occupational Therapy Evaluation Patient Details Name: [name] MRN: [id number] DOB: 1930-08-01 Today's Date: 04/12/2024   History of Present Illness   The pt is a 89 y.o. male who presented to [facility]/06/[facility] after falling. Pt with 2mm L frontal SDH w/o mass effect, conservative management. Also with hypotension likely 2/2 dehydration, UTI, and L subcapital femoral neck fx s/p percutaneous screw fixation 11/8. PMHx: CAD, combined diastolic and systolic HF, CKD stage III, HTN, HLD, and DM, gout.     Clinical Impressions Pt received seated EOB with PT staff present upon OT arrival to room. Pt presenting with flat affect, but pleasant & participatory. He is a poor and inconsistent historian - stating his caregiver lives with him full time whom assists with bathing/dressing, then stating that his caregiver works and is at the house approx. 50% of the time. Pt endorsing some pain in LLE but did not appear to greatly impact his performance today. Functionally, he required total A for LB ADLs and setup-min A for seated UB ADLs. Mod A +2 to stand and step pivot to chair via RW. Needs step-by-step cues to safely approach chair 2/2 low vision and cog deficits. ? Artificial R eye. SpO2 desatted to 87% on 3L, titrated to 4L with slow improvement in O2 sats with seated rest. Otherwise, VSS.  Pt is currently functioning below baseline and would benefit from ongoing acute OT services to progress towards safe discharge and to facilitate return to prior level of function. Current recommendation is post-acute rehab (< 3 hours/day). If caregiver confirms can provide 24/7 assist, could return home with HHOT.     If plan is discharge home, recommend the following:   Two people to help with walking and/or transfers;A lot of help with bathing/dressing/bathroom;Assistance with cooking/housework;Direct supervision/assist for medications management;Direct supervision/assist for financial management;Assist for  transportation;Help with stairs or ramp for entrance;Supervision due to cognitive status     Functional Status Assessment   Patient has had a recent decline in their functional status and demonstrates the ability to make significant improvements in function in a reasonable and predictable amount of time.     Equipment Recommendations   Other (comment) (defer to next level of care)     Recommendations for Other Services         Precautions/Restrictions   Precautions Precautions: Fall Recall of Precautions/Restrictions: Intact Restrictions Weight Bearing Restrictions Per Provider Order: Yes LLE Weight Bearing Per Provider Order: Weight bearing as tolerated     Mobility Bed Mobility               General bed mobility comments: pt received seated EOB with PT present upon OT arrival    Transfers Overall transfer level: Needs assistance   Transfers: Sit to/from Stand, Bed to chair/wheelchair/BSC Sit to Stand: Mod assist, +2 physical assistance, +2 safety/equipment     Step pivot transfers: Mod assist, +2 physical assistance, +2 safety/equipment     General transfer comment: Stood from bed height with cues for hand placement and powering up, needed mod A +2 with several multimodal cues to safely approach chair with RW 2/2 poor vision and impaired cognition      Balance Overall balance assessment: Needs assistance Sitting-balance support: Single extremity supported, No upper extremity supported, Feet supported Sitting balance-Leahy Scale: Fair Sitting balance - Comments: seated EOB, occasional CGA for safety   Standing balance support: Bilateral upper extremity supported, During functional activity, Reliant on assistive device for balance Standing balance-Leahy Scale: Poor Standing balance comment: needs  mod A +2 with reliance on RW in stance                           ADL either performed or assessed with clinical judgement   ADL Overall ADL's  : Needs assistance/impaired Eating/Feeding: Minimal assistance;Bed level   Grooming: Set up;Cueing for sequencing;Sitting;Wash/dry face           Upper Body Dressing : Minimal assistance;Sitting   Lower Body Dressing: Total assistance       Toileting- Clothing Manipulation and Hygiene: Total assistance Toileting - Clothing Manipulation Details (indicate cue type and reason): foley intact     Functional mobility during ADLs: Moderate assistance;+2 for physical assistance;+2 for safety/equipment       Vision Ability to See in Adequate Light: 3 Highly impaired Patient Visual Report:  (pt unable to report 2/2 cognition) Vision Assessment?: Vision impaired- to be further tested in functional context Additional Comments: observed ? artificial lens in R eye, unable to detect visual stim with L eye occluded     Perception         Praxis         Pertinent Vitals/Pain Pain Assessment Pain Assessment: Faces Faces Pain Scale: Hurts little more Pain Location: LLE Pain Descriptors / Indicators: Discomfort, Grimacing, Moaning Pain Intervention(s): Monitored during session, Premedicated before session, Repositioned, Limited activity within patient's tolerance     Extremity/Trunk Assessment Upper Extremity Assessment Upper Extremity Assessment: Generalized weakness   Lower Extremity Assessment Lower Extremity Assessment: Defer to PT evaluation       Communication Communication Communication: Impaired Factors Affecting Communication: Hearing impaired (no hearing aides present during OT eval)   Cognition Arousal: Alert Behavior During Therapy: Flat affect (but participatory) Cognition: Cognition impaired   Orientation impairments: Situation (pt asking so I didn't have surgery?, re-oriented appropriately) Awareness: Intellectual awareness impaired Memory impairment (select all impairments): Short-term memory, Working memory Attention impairment (select first level of  impairment): Focused attention Executive functioning impairment (select all impairments): Initiation, Organization, Sequencing, Reasoning, Problem solving OT - Cognition Comments: pt requires step-by-step verbal, visual and cog cues for all task execution                 Following commands: Impaired Following commands impaired: Follows one step commands inconsistently, Follows one step commands with increased time     Cueing  General Comments   Cueing Techniques: Verbal cues;Tactile cues  SpO2 desatted to approx 87% on 3L, titrated to 4L with slow improvement in O2 sats to > 92% following seated rest   Exercises     Shoulder Instructions      Home Living Family/patient expects to be discharged to:: Private residence Living Arrangements: Other (Comment);Alone Available Help at Discharge:  (caregiver) Type of Home: House Home Access: Level entry     Home Layout: One level     Bathroom Shower/Tub: Producer, Television/film/video: Handicapped height     Home Equipment: Grab bars - toilet;Grab bars - tub/shower;Rollator (4 wheels);Wheelchair - manual;Shower seat;Hospital bed   Additional Comments: pt is a poor historian, providing inconsistencies during social hx/PLOF      Prior Functioning/Environment Prior Level of Function : Needs assist;History of Falls (last six months)  Cognitive Assist : Mobility (cognitive);ADLs (cognitive) Mobility (Cognitive): Step by step cues ADLs (Cognitive): Step by step cues Physical Assist : ADLs (physical)   ADLs (physical): Bathing;Dressing;IADLs Mobility Comments: walks with a rollator at baseline, reports several falls recently due to  R knee buckling ADLs Comments: aide assists with bathing/dressing (pt is a poor and unreliable historian)    OT Problem List: Decreased strength;Decreased range of motion;Decreased activity tolerance;Impaired balance (sitting and/or standing);Impaired vision/perception;Decreased  cognition;Decreased safety awareness;Cardiopulmonary status limiting activity;Pain   OT Treatment/Interventions: Self-care/ADL training;Therapeutic exercise;Energy conservation;Therapeutic activities;Cognitive remediation/compensation;Patient/family education;Balance training      OT Goals(Current goals can be found in the care plan section)   Acute Rehab OT Goals Patient Stated Goal: pt did not state goal OT Goal Formulation: With patient Time For Goal Achievement: 04/26/24 Potential to Achieve Goals: Good   OT Frequency:  Min 2X/week    Co-evaluation              AM-PAC OT 6 Clicks Daily Activity     Outcome Measure Help from another person eating meals?: A Little Help from another person taking care of personal grooming?: A Little Help from another person toileting, which includes using toliet, bedpan, or urinal?: Total Help from another person bathing (including washing, rinsing, drying)?: A Lot Help from another person to put on and taking off regular upper body clothing?: A Little Help from another person to put on and taking off regular lower body clothing?: Total 6 Click Score: 13   End of Session Equipment Utilized During Treatment: Gait belt;Rolling walker (2 wheels) Nurse Communication: Mobility status  Activity Tolerance: Patient tolerated treatment well Patient left: in chair;with call bell/phone within reach (chair alarm pad in place, no alarm box available - RN ok and aware)  OT Visit Diagnosis: Unsteadiness on feet (R26.81);History of falling (Z91.81);Low vision, both eyes (H54.2);Other symptoms and signs involving cognitive function;Pain Pain - Right/Left: Left Pain - part of body: Leg                Time: 9149-9087 OT Time Calculation (min): 22 min Charges:  OT General Charges $OT Visit: 1 Visit OT Evaluation $OT Eval Moderate Complexity: 1 Mod  Rikki BIRCH., MSOT, OTR/L Acute Rehabilitation Services [phone] Secure Chat Preferred  Rikki Milch 04/12/2024, 11:10 AM

## 2024-04-12 NOTE — Plan of Care (Signed)
  Problem: Clinical Measurements: Goal: Diagnostic test results will improve Outcome: Progressing Goal: Signs and symptoms of infection will decrease Outcome: Progressing   Problem: Health Behavior/Discharge Planning: Goal: Ability to identify and utilize available resources and services will improve Outcome: Progressing Goal: Ability to manage health-related needs will improve Outcome: Progressing   Problem: Clinical Measurements: Goal: Ability to maintain clinical measurements within normal limits will improve Outcome: Progressing Goal: Will remain free from infection Outcome: Progressing Goal: Diagnostic test results will improve Outcome: Progressing Goal: Respiratory complications will improve Outcome: Progressing Goal: Cardiovascular complication will be avoided Outcome: Progressing

## 2024-04-12 NOTE — Progress Notes (Signed)
 Patient endorsed sharp chest pain. Vitals & EKG completed. Notified overnight MD. IV morphine  given x1, sublingual nitroglycerin  given x1, & 250cc fluid bolus given.  Troponin drawn (12ng/l - WNL) and patient then endorsed chest pain just about gone, now I am fine.  Updated Dr. Lawence - If pain persists give the PO oxy and no more nitroglycerin .

## 2024-04-12 NOTE — Evaluation (Addendum)
 Physical Therapy Evaluation Patient Details Name: [name] MRN: [id number] DOB: 03-20-1931 Today's Date: 04/12/2024  History of Present Illness  The pt is a 89 y.o. male who presented to [facility] 04/06/24 after falling. Pt with 2mm L frontal SDH w/o mass effect, conservative management. Also with hypotension likely 2/2 dehydration, UTI, and L subcapital femoral neck fx s/p percutaneous screw fixation 11/8. PMHx: CAD, combined diastolic and systolic HF, CKD stage III, HTN, HLD, and DM, gout.   Clinical Impression  Pt in bed upon arrival of PT, agreeable to evaluation at this time. Prior to admission the pt reports he was ambulating and transferring independently with use of a rollator at home, but that he has a live-in caretaker who can assist with ADLs and mobility as needed. However, pt is unreliable historian with no family present, inconsistent with information within session. Pt required modA to complete bed mobility, and up to modA of 2 to complete sit-stand transfers with use of RW due to deficits in strength, coordination, sequencing, power, and stability. Pt with limited anterior wt shift and maintains posterior lean at times with static stance. Pt benefits from max sequential cues for all movements due to low vision and coordination deficits, will benefit from continued skilled PT acutely and post-acute inpatient therapies <3hours/day to facilitate improved mobility and safety with transfers prior to return home with caregiver assist.   SpO2 99% on 3L at rest, low of 85% after sit-stand transfer, >90% on 4L    If plan is discharge home, recommend the following: Two people to help with walking and/or transfers;Two people to help with bathing/dressing/bathroom;Direct supervision/assist for medications management;Direct supervision/assist for financial management;Assist for transportation;Assistance with feeding;Assistance with cooking/housework;Help with stairs or ramp for entrance;Supervision  due to cognitive status   Can travel by private vehicle   No    Equipment Recommendations None recommended by PT  Recommendations for Other Services       Functional Status Assessment Patient has had a recent decline in their functional status and demonstrates the ability to make significant improvements in function in a reasonable and predictable amount of time.     Precautions / Restrictions Precautions Precautions: Fall Recall of Precautions/Restrictions: Intact Restrictions Weight Bearing Restrictions Per Provider Order: Yes LLE Weight Bearing Per Provider Order: Weight bearing as tolerated      Mobility  Bed Mobility Overal bed mobility: Needs Assistance Bed Mobility: Supine to Sit     Supine to sit: Mod assist, Used rails     General bed mobility comments: modA to manage LE movement to EOB and modA to elevate trunk    Transfers Overall transfer level: Needs assistance Equipment used: Rolling walker (2 wheels) Transfers: Sit to/from Stand, Bed to chair/wheelchair/BSC Sit to Stand: Mod assist, +2 physical assistance, +2 safety/equipment   Step pivot transfers: Mod assist, +2 physical assistance, +2 safety/equipment       General transfer comment: Stood from bed height with cues for hand placement and powering up,poor anterior wt shift and limited power in LE to rise. Needed mod A +2 with several multimodal cues to safely approach chair with RW 2/2 poor vision and impaired cognition    Ambulation/Gait Ambulation/Gait assistance: Mod assist, +2 physical assistance, +2 safety/equipment Gait Distance (Feet): 3 Feet Assistive device: Rolling walker (2 wheels) Gait Pattern/deviations: Step-to pattern, Decreased stride length, Decreased stance time - left, Decreased weight shift to left, Shuffle Gait velocity: decreased     General Gait Details: max cues for directing due to impaired coordination  and low vision. no overt buckling but poor wt tolerance on LLE. SpO2  to 86% on 4L     Balance Overall balance assessment: Needs assistance Sitting-balance support: Single extremity supported, No upper extremity supported, Feet supported Sitting balance-Leahy Scale: Fair Sitting balance - Comments: seated EOB, occasional CGA for safety   Standing balance support: Bilateral upper extremity supported, During functional activity, Reliant on assistive device for balance Standing balance-Leahy Scale: Poor Standing balance comment: needs mod A +2 with reliance on RW in stance                             Pertinent Vitals/Pain Pain Assessment Pain Assessment: Faces Pain Score: 4  Faces Pain Scale: Hurts little more Pain Location: LLE Pain Descriptors / Indicators: Discomfort, Grimacing, Moaning Pain Intervention(s): Limited activity within patient's tolerance, Monitored during session, Repositioned, Premedicated before session    Home Living Family/patient expects to be discharged to:: Private residence Living Arrangements: Other (Comment);Alone (caregiver) Available Help at Discharge: Personal care attendant;Available 24 hours/day Type of Home: House Home Access: Level entry       Home Layout: One level Home Equipment: Grab bars - toilet;Grab bars - tub/shower;Rollator (4 wheels);Wheelchair - manual;Shower seat;Hospital bed Additional Comments: pt is a poor historian, providing inconsistencies during social hx/PLOF    Prior Function Prior Level of Function : Needs assist;History of Falls (last six months)  Cognitive Assist : Mobility (cognitive);ADLs (cognitive) Mobility (Cognitive): Step by step cues ADLs (Cognitive): Step by step cues Physical Assist : ADLs (physical)   ADLs (physical): Bathing;Dressing;IADLs Mobility Comments: walks with rollator, can stand by himself but reports mostly in bed or recliner other than trips to bathroom, one other fall ADLs Comments: aide assists with bathing and dressing, patient feeds himself, aide  does IADLs     Extremity/Trunk Assessment   Upper Extremity Assessment Upper Extremity Assessment: Defer to OT evaluation;Generalized weakness    Lower Extremity Assessment Lower Extremity Assessment: Generalized weakness;LLE deficits/detail;RLE deficits/detail RLE Deficits / Details: Hip flexion 3+/5, knee extension 4/5, knee flexion 4-/5, ankle DF 4-/5. RLE Sensation: WNL LLE Deficits / Details: Hip flexion 3/5 (pain limited), knee extension 4-/5, knee flexion 4/5, ankle DF 4-/5. LLE: Unable to fully assess due to pain LLE Sensation: WNL    Cervical / Trunk Assessment Cervical / Trunk Assessment: Kyphotic  Communication   Communication Communication: Impaired Factors Affecting Communication: Hearing impaired (no hearing aides present during OT eval)    Cognition Arousal: Alert Behavior During Therapy: Flat affect, WFL for tasks assessed/performed   PT - Cognitive impairments: No family/caregiver present to determine baseline, Orientation, Awareness, Memory, Initiation, Sequencing, Problem solving, Safety/Judgement   Orientation impairments: Time                   PT - Cognition Comments: pt stating it was near thanksgiving then guesing month as January. oriented to year. with repeated questioning about PLOF, pt giving different information, needed max cues for sequencing all tasks Following commands: Impaired Following commands impaired: Follows one step commands inconsistently, Follows one step commands with increased time     Cueing Cueing Techniques: Verbal cues, Tactile cues     General Comments General comments (skin integrity, edema, etc.): SpO2 desatted to approx 85% on 3L, titrated to 4L with slow improvement in O2 sats to > 92% following seated rest    Exercises     Assessment/Plan    PT Assessment Patient needs continued PT services  PT Problem List Decreased strength;Decreased range of motion;Decreased activity tolerance;Decreased  balance;Decreased mobility;Decreased coordination;Decreased cognition;Decreased safety awareness;Decreased knowledge of precautions;Pain       PT Treatment Interventions DME instruction;Gait training;Stair training;Functional mobility training;Therapeutic activities;Therapeutic exercise;Balance training;Cognitive remediation;Neuromuscular re-education;Patient/family education    PT Goals (Current goals can be found in the Care Plan section)  Acute Rehab PT Goals Patient Stated Goal: to return home PT Goal Formulation: With patient Time For Goal Achievement: 04/26/24 Potential to Achieve Goals: Fair    Frequency Min 2X/week        AM-PAC PT 6 Clicks Mobility  Outcome Measure Help needed turning from your back to your side while in a flat bed without using bedrails?: A Lot Help needed moving from lying on your back to sitting on the side of a flat bed without using bedrails?: A Lot Help needed moving to and from a bed to a chair (including a wheelchair)?: Total Help needed standing up from a chair using your arms (e.g., wheelchair or bedside chair)?: Total Help needed to walk in hospital room?: Total Help needed climbing 3-5 steps with a railing? : Total 6 Click Score: 8    End of Session Equipment Utilized During Treatment: Gait belt;Oxygen Activity Tolerance: Patient limited by fatigue Patient left: in chair;with call bell/phone within reach (no chair alarm equipment in room or extra on unit, RN aware and okay without alarm) Nurse Communication: Mobility status PT Visit Diagnosis: Unsteadiness on feet (R26.81);Other abnormalities of gait and mobility (R26.89);Muscle weakness (generalized) (M62.81);Pain Pain - Right/Left: Left Pain - part of body: Hip    Time: 0829-0912 PT Time Calculation (min) (ACUTE ONLY): 43 min   Charges:   PT Evaluation $PT Eval Moderate Complexity: 1 Mod PT Treatments $Therapeutic Activity: 8-22 mins PT General Charges $$ ACUTE PT VISIT: 1  Visit         Izetta Call, PT, DPT   Acute Rehabilitation Department Office [phone] Secure Chat Communication Preferred  [name] 04/12/2024, 12:37 PM

## 2024-04-12 NOTE — Progress Notes (Addendum)
 [facility]  PROGRESS NOTE  RAJAT STAVER FMW:[id number] DOB: 01-25-1931 DOA: 04/06/2024 PCP: Maree Leni Edyth DELENA, MD   Brief HPI:   89 yo male with the past medical history of coronary artery disease, heart failure, CKD and T2DM who presented after a mechanical fall. On the day of hospitalization he was diagnosed with urinary tract infection by his primary care provider. Apparently at home he had not being eating well, very poor oral intake, and having hallucinations. At home sustained a mechanical fall while trying to put is clothes, leaned forward to far. No head trauma, landing over his left side.  EMS was called, he was found with blood pressure 108/60, he was placed on 2 L/min per [place] with 02 saturation 95%, then transported to the ED.  On his initial physical examination in the ED, his blood pressure was 64/48, placed on peripheral norepinephrine with blood pressure 111/80, HR 83, RR 17 and 02 saturation 85% on room air    CT cervical spine with no acute fracture or traumatic malalignment.  CT chest abdomen and pelvis with no acute traumatic injury identified in the chest, abdomen or pelvis.  No acute rib fracture identified.  CT head with thin subdural hematoma over the left frontal convexity without mass effect.  Mild chronic small vessel ischemic disease.     Assessment/Plan:    Left hip pain/subcapital left femoral neck fracture -Patient complained of left hip pain last night, x-ray of hip with pelvis on left done this morning showed findings suggesting subcapital left femoral neck fracture - CT hip obtained showed impacted subcapital fracture of the left femoral head- neck junction -Cardiology was consulted for preop evaluation, cardiology did not recommend surgery given multiple comorbidities and significant cardiac history -Orthopedic surgery consulted, Dr. Edna recommends cannulated screw fixation which is 20 minutes surgery, goal to stabilize the fracture for pain  control and mobility - Family has decided to go ahead with the procedure - Underwent fixation of femoral neck fracture using screw  - Orthopedics following   Preop evaluation/history of CAD/chronic combined systolic and diastolic CHF - Patient has significant history of CAD, multivessel disease, STEMI in 2019, treated with DES x 3 to proximal left circumflex, proximal OMI and distal OMI with PCI to mid distal RCA with DES x 2 - Also has combined systolic and diastolic heart failure with last echo from 2019 showed EF of 40 to 45%; patient's BNP obtained today was 125 - Does not appear to be in acute heart failure -Revised cardiac risk index score of 3; 10% risk of major cardiac event -Cardiology was consulted; deemed patient not a candidate for surgery given significant cardiac history and multiple comorbidities - After discussion with family, they agreed for procedure  Subdural hematoma -Repeat CT head with unchanged ten 2 mm thick subdural hematoma along the left cerebral convexity, no mass effect - Conservative management per neurosurgery,  Hypotension - Patient had severe hypotension requiring vasopressors; likely in setting of dehydration - No clinical signs of sepsis - He is now off vasopressors - Echocardiogram has been ordered; will follow the result  Hypertension - Patient blood pressure is now elevated - Also found to have sinus tachycardia - He was taking metoprolol  at home,  - Started back on  metoprolol  25 mg daily  CKD stage IIIa - Creatinine has improved with IV fluids -Creatinine is 1.17  UTI - Started on Rocephin  - Urine culture from 04/06/2024 showed no growth - Rocephin  has been discontinued  Diabetes  mellitus type 2 - Continue sliding scale insulin  NovoLog  - CBG well-controlled    DVT prophylaxis: SCDs  Medications     atorvastatin   80 mg Oral Daily   Chlorhexidine  Gluconate Cloth  6 each Topical Daily   feeding supplement  237 mL Oral BID BM    insulin  aspart  0-15 Units Subcutaneous TID WC   metoprolol  succinate  25 mg Oral Daily   sertraline   50 mg Oral QHS     Data Reviewed:   CBG:  Recent Labs  Lab 04/11/24 0917 04/11/24 1139 04/11/24 1600 04/11/24 2133 04/12/24 0603  GLUCAP 137* 166* 189* 194* 132*    SpO2: 100 % O2 Flow Rate (L/min): 2 L/min    Vitals:   04/11/24 2005 04/11/24 2352 04/12/24 0420 04/12/24 0719  BP: 102/70 111/70 122/75 (!) 140/80  Pulse: 88 81 78 73  Resp: 18 17 17 19   Temp: 98.3 F (36.8 C) 98.1 F (36.7 C) 98.5 F (36.9 C) 98.1 F (36.7 C)  TempSrc: Oral Oral Oral Oral  SpO2: 93% 99% 99% 100%  Weight:      Height:          Data Reviewed:  Basic Metabolic Panel: Recent Labs  Lab 04/06/24 1130 04/07/24 0120 04/08/24 0536  NA 138 136 138  K 3.9 4.3 3.6  CL 107 102 105  CO2 21* 23 21*  GLUCOSE 122* 122* 118*  BUN 20 18 13   CREATININE 1.63* 1.53* 1.17  CALCIUM  8.8* 8.4* 8.5*  MG  --   --  1.8  PHOS  --  3.7  --     CBC: Recent Labs  Lab 04/06/24 1130 04/07/24 0120 04/08/24 0156  WBC 6.9 8.5 7.6  NEUTROABS 4.8  --   --   HGB 13.5 12.7* 12.7*  HCT 41.4 39.2 38.9*  MCV 89.2 89.5 89.0  PLT 203 156 126*    LFT Recent Labs  Lab 04/06/24 1130 04/07/24 0120  AST 27  --   ALT 25  --   ALKPHOS 71  --   BILITOT 0.9  --   PROT 7.0  --   ALBUMIN 3.3* 3.0*     Antibiotics: Anti-infectives (From admission, onward)    Start     Dose/Rate Route Frequency Ordered Stop   04/11/24 2200  ceFAZolin  (ANCEF ) IVPB 2g/100 mL premix        2 g 200 mL/hr over 30 Minutes Intravenous Every 8 hours 04/11/24 1707 04/12/24 0930   04/11/24 0715  ceFAZolin  (ANCEF ) IVPB 2g/100 mL premix        2 g 200 mL/hr over 30 Minutes Intravenous On call to O.R. 04/11/24 9287 04/11/24 0823   04/07/24 1215  cefTRIAXone  (ROCEPHIN ) 1 g in sodium chloride  0.9 % 100 mL IVPB  Status:  Discontinued        1 g 200 mL/hr over 30 Minutes Intravenous Every 24 hours 04/07/24 1205 04/08/24 1651    04/06/24 1300  vancomycin  (VANCOREADY) IVPB 2000 mg/400 mL        2,000 mg 200 mL/hr over 120 Minutes Intravenous  Once 04/06/24 1250 04/06/24 1645   04/06/24 1300  piperacillin -tazobactam (ZOSYN ) IVPB 3.375 g        3.375 g 100 mL/hr over 30 Minutes Intravenous  Once 04/06/24 1250 04/06/24 1612   04/06/24 1145  cefTRIAXone  (ROCEPHIN ) 2 g in sodium chloride  0.9 % 100 mL IVPB  Status:  Discontinued        2 g 200 mL/hr over 30 Minutes  Intravenous Every 24 hours 04/06/24 1137 04/06/24 1745        CONSULTS neurosurgery  Code Status: Full code  Family Communication: No family at bedside     Subjective   Patient seen and examined, denies pain.  Pain well-controlled after surgery.   Objective    Physical Examination:  Appears in no acute distress S1-S2, regular, no murmur auscultated Lungs are clear to auscultation bilaterally Abdomen is soft, nontender, no organomegaly  Status is: Inpatient:             [name]   [facility] If 7PM-7AM, please contact night-coverage at [email or web address], Office  [phone]   04/12/2024, 11:41 AM  LOS: 6 days

## 2024-04-13 ENCOUNTER — Encounter (HOSPITAL_COMMUNITY): Payer: Self-pay | Admitting: Orthopedic Surgery

## 2024-04-13 DIAGNOSIS — E860 Dehydration: Secondary | ICD-10-CM | POA: Diagnosis not present

## 2024-04-13 DIAGNOSIS — N1831 Chronic kidney disease, stage 3a: Secondary | ICD-10-CM | POA: Diagnosis not present

## 2024-04-13 DIAGNOSIS — S065XAA Traumatic subdural hemorrhage with loss of consciousness status unknown, initial encounter: Secondary | ICD-10-CM | POA: Diagnosis not present

## 2024-04-13 DIAGNOSIS — I10 Essential (primary) hypertension: Secondary | ICD-10-CM | POA: Diagnosis not present

## 2024-04-13 LAB — GLUCOSE, CAPILLARY
Glucose-Capillary: 130 mg/dL — ABNORMAL HIGH (ref 70–99)
Glucose-Capillary: 173 mg/dL — ABNORMAL HIGH (ref 70–99)
Glucose-Capillary: 91 mg/dL (ref 70–99)
Glucose-Capillary: 112 mg/dL — ABNORMAL HIGH (ref 70–99)

## 2024-04-13 NOTE — Progress Notes (Signed)
     Subjective:  Patient reports pain as mild.  Worked well with physical therapy yesterday able to stand and take 3 steps.  Lying comfortably in bed this morning.  No complaints.  Encouraged him to continue to work hard with physical therapy on strengthening and mobility.   Yesterday's total administered Morphine  [place] Equivalents: 6   Objective:   VITALS:   Vitals:   04/12/24 2239 04/13/24 0040 04/13/24 0354 04/13/24 0600  BP: 104/62 139/87 127/82   Pulse:  91 84   Resp: 14 14    Temp:  98.4 F (36.9 C) 98 F (36.7 C)   TempSrc:  Oral Oral   SpO2: 98% 90% 100%   Weight:    87.1 kg  Height:        Sensation intact distally Intact pulses distally Dorsiflexion/Plantar flexion intact Incision: dressing C/D/I Compartment soft    Lab Results  Component Value Date   WBC 7.6 04/08/2024   HGB 12.7 (L) 04/08/2024   HCT 38.9 (L) 04/08/2024   MCV 89.0 04/08/2024   PLT 126 (L) 04/08/2024   BMET    Component Value Date/Time   NA 138 04/08/2024 0536   NA 140 06/27/2022 0945   K 3.6 04/08/2024 0536   CL 105 04/08/2024 0536   CO2 21 (L) 04/08/2024 0536   GLUCOSE 118 (H) 04/08/2024 0536   BUN 13 04/08/2024 0536   BUN 15 06/27/2022 0945   CREATININE 1.17 04/08/2024 0536   CALCIUM  8.5 (L) 04/08/2024 0536   EGFR 61 06/27/2022 0945   GFRNONAA 59 (L) 04/08/2024 0536      Xray: cannulated screw fixation left femoral neck frcture, no adverse features  Assessment/Plan: 2 Days Post-Op   Principal Problem:   Dehydration Active Problems:   Type 2 diabetes mellitus with hyperlipidemia (HCC)   Obesity (BMI 30-39.9)   Gout   HTN (hypertension), benign   Chronic kidney disease, stage 3a (HCC)   CAD (coronary artery disease)   UTI (urinary tract infection)   Subdural hematoma (HCC)   Depression  S/p left femoral neck CRPP 04/11/24  Post op recs: WB: WBAT LLE Abx: ancef  x23 hours post op Imaging: PACU xrays Dressing: keep intact until follow up, change PRN if  soiled or saturated. DVT prophylaxis: hold given hx of subdural Follow up: 2 weeks after surgery for a wound check [facility] Dr[facility] Edna at [facility].  Address: [address], [place], [place] [address]  Office Phone: [phone]    [name] 04/13/2024, 6:33 AM   Ricky Edna, MD  Contact information:   [phone] 7am-5pm epic message Dr. Edna, or call office for patient follow up: [phone] After hours and holidays please check [email or web address] for group call information for [facility]

## 2024-04-13 NOTE — Plan of Care (Signed)

## 2024-04-13 NOTE — TOC Progression Note (Signed)
 Transition of Care [facility]) - Progression Note    Patient Details  Name: [name] MRN: [id number] Date of Birth: 1930/10/10  [facility] CM/SW Contact  [name], [place] Phone Number: 04/13/2024, 11:01 AM  Clinical Narrative:   CSW met with patient to discuss recommendation for SNF. Patient in agreement, indicated no preference. CSW asked about updating family, patient said they were all working. CSW completed referral and faxed out, will update with bed offers.     Expected Discharge Plan: Skilled Nursing Facility Barriers to Discharge: Continued Medical Work up, English As A Second Language Teacher               Expected Discharge Plan and Services       Living arrangements for the past 2 months: Single Family Home                                       Social Drivers of Health (SDOH) Interventions SDOH Screenings   Food Insecurity: No Food Insecurity (04/07/2024)  Housing: Low Risk  (04/07/2024)  Transportation Needs: No Transportation Needs (04/07/2024)  Utilities: Not At Risk (04/07/2024)  Financial Resource Strain: Medium Risk (02/05/2018)  Physical Activity: Unknown (02/06/2018)  Social Connections: Socially Isolated (04/07/2024)  Stress: No Stress Concern Present (02/06/2018)  Tobacco Use: Medium Risk (04/11/2024)    Readmission Risk Interventions     No data to display

## 2024-04-13 NOTE — Plan of Care (Signed)
  Problem: Clinical Measurements: Goal: Diagnostic test results will improve Outcome: Progressing Goal: Signs and symptoms of infection will decrease Outcome: Progressing   Problem: Respiratory: Goal: Ability to maintain adequate ventilation will improve Outcome: Progressing   Problem: Education: Goal: Ability to describe self-care measures that may prevent or decrease complications (Diabetes Survival Skills Education) will improve Outcome: Progressing Goal: Individualized Educational Video(s) Outcome: Progressing

## 2024-04-13 NOTE — Progress Notes (Signed)
 [facility]  PROGRESS NOTE  [name] FMW:[id number] DOB: [birth year] DOA: 04/06/2024 PCP: Maree Leni Edyth DELENA, MD   Brief HPI:   89 yo male with the past medical history of coronary artery disease, heart failure, CKD and T2DM who presented after a mechanical fall. On the day of hospitalization he was diagnosed with urinary tract infection by his primary care provider. Apparently at home he had not being eating well, very poor oral intake, and having hallucinations. At home sustained a mechanical fall while trying to put is clothes, leaned forward to far. No head trauma, landing over his left side.  EMS was called, he was found with blood pressure 108/60, he was placed on 2 L/min per [place] with 02 saturation 95%, then transported to the ED.  On his initial physical examination in the ED, his blood pressure was 64/48, placed on peripheral norepinephrine with blood pressure 111/80, HR 83, RR 17 and 02 saturation 85% on room air    CT cervical spine with no acute fracture or traumatic malalignment.  CT chest abdomen and pelvis with no acute traumatic injury identified in the chest, abdomen or pelvis.  No acute rib fracture identified.  CT head with thin subdural hematoma over the left frontal convexity without mass effect.  Mild chronic small vessel ischemic disease.     Assessment/Plan:    Left hip pain/subcapital left femoral neck fracture -Patient complained of left hip pain last night, x-ray of hip with pelvis on left done this morning showed findings suggesting subcapital left femoral neck fracture - CT hip obtained showed impacted subcapital fracture of the left femoral head- neck junction -Cardiology was consulted for preop evaluation, cardiology did not recommend surgery given multiple comorbidities and significant cardiac history -Orthopedic surgery consulted, Dr. Edna recommends cannulated screw fixation which is 20 minutes surgery, goal to stabilize the fracture for pain  control and mobility - Family has decided to go ahead with the procedure - Underwent fixation of femoral neck fracture using screw  - Orthopedics following - Plan to go to skilled nursing facility for rehab - No DVT prophylaxis ordered due to SDH   Preop evaluation/history of CAD/chronic combined systolic and diastolic CHF - Patient has significant history of CAD, multivessel disease, STEMI in 2019, treated with DES x 3 to proximal left circumflex, proximal OMI and distal OMI with PCI to mid distal RCA with DES x 2 - Also has combined systolic and diastolic heart failure with last echo from 2019 showed EF of 40 to 45%; patient's BNP obtained today was 125 - Does not appear to be in acute heart failure -Revised cardiac risk index score of 3; 10% risk of major cardiac event -Cardiology was consulted; deemed patient not a candidate for surgery given significant cardiac history and multiple comorbidities - After discussion with family, they agreed for procedure - Echocardiogram from 04/09/2024 showed EF of 60 to 65% no regional wall motion abnormalities  Subdural hematoma -Repeat CT head with unchanged ten 2 mm thick subdural hematoma along the left cerebral convexity, no mass effect - Conservative management per neurosurgery,  Hypotension - Patient had severe hypotension requiring vasopressors; likely in setting of dehydration - No clinical signs of sepsis - He is now off vasopressors   Hypertension - Patient blood pressure is now elevated - Also found to have sinus tachycardia - He was taking metoprolol  at home,  - Started back on  metoprolol  25 mg daily  CKD stage IIIa - Creatinine has improved with IV  fluids -Creatinine is 1.17  UTI - Started on Rocephin  - Urine culture from 04/06/2024 showed no growth - Rocephin  has been discontinued  Diabetes mellitus type 2 - Continue sliding scale insulin  NovoLog  - CBG well-controlled    DVT prophylaxis: SCDs  Medications      atorvastatin   80 mg Oral Daily   Chlorhexidine  Gluconate Cloth  6 each Topical Daily   feeding supplement  237 mL Oral BID BM   insulin  aspart  0-15 Units Subcutaneous TID WC   metoprolol  succinate  25 mg Oral Daily   sertraline   50 mg Oral QHS     Data Reviewed:   CBG:  Recent Labs  Lab 04/12/24 0603 04/12/24 1152 04/12/24 1530 04/12/24 2224 04/13/24 0803  GLUCAP 132* 136* 136* 159* 130*    SpO2: 98 % O2 Flow Rate (L/min): 2 L/min    Vitals:   04/13/24 0040 04/13/24 0354 04/13/24 0600 04/13/24 0730  BP: 139/87 127/82  117/76  Pulse: 91 84  87  Resp: 14   16  Temp: 98.4 F (36.9 C) 98 F (36.7 C)  98 F (36.7 C)  TempSrc: Oral Oral    SpO2: 90% 100%  98%  Weight:   87.1 kg   Height:          Data Reviewed:  Basic Metabolic Panel: Recent Labs  Lab 04/06/24 1130 04/07/24 0120 04/08/24 0536  NA 138 136 138  K 3.9 4.3 3.6  CL 107 102 105  CO2 21* 23 21*  GLUCOSE 122* 122* 118*  BUN 20 18 13   CREATININE 1.63* 1.53* 1.17  CALCIUM  8.8* 8.4* 8.5*  MG  --   --  1.8  PHOS  --  3.7  --     CBC: Recent Labs  Lab 04/06/24 1130 04/07/24 0120 04/08/24 0156  WBC 6.9 8.5 7.6  NEUTROABS 4.8  --   --   HGB 13.5 12.7* 12.7*  HCT 41.4 39.2 38.9*  MCV 89.2 89.5 89.0  PLT 203 156 126*    LFT Recent Labs  Lab 04/06/24 1130 04/07/24 0120  AST 27  --   ALT 25  --   ALKPHOS 71  --   BILITOT 0.9  --   PROT 7.0  --   ALBUMIN 3.3* 3.0*     Antibiotics: Anti-infectives (From admission, onward)    Start     Dose/Rate Route Frequency Ordered Stop   04/11/24 2200  ceFAZolin  (ANCEF ) IVPB 2g/100 mL premix        2 g 200 mL/hr over 30 Minutes Intravenous Every 8 hours 04/11/24 1707 04/12/24 0930   04/11/24 0715  ceFAZolin  (ANCEF ) IVPB 2g/100 mL premix        2 g 200 mL/hr over 30 Minutes Intravenous On call to O.R. 04/11/24 9287 04/11/24 0823   04/07/24 1215  cefTRIAXone  (ROCEPHIN ) 1 g in sodium chloride  0.9 % 100 mL IVPB  Status:  Discontinued        1  g 200 mL/hr over 30 Minutes Intravenous Every 24 hours 04/07/24 1205 04/08/24 1651   04/06/24 1300  vancomycin  (VANCOREADY) IVPB 2000 mg/400 mL        2,000 mg 200 mL/hr over 120 Minutes Intravenous  Once 04/06/24 1250 04/06/24 1645   04/06/24 1300  piperacillin -tazobactam (ZOSYN ) IVPB 3.375 g        3.375 g 100 mL/hr over 30 Minutes Intravenous  Once 04/06/24 1250 04/06/24 1612   04/06/24 1145  cefTRIAXone  (ROCEPHIN ) 2 g in sodium chloride  0.9 %  100 mL IVPB  Status:  Discontinued        2 g 200 mL/hr over 30 Minutes Intravenous Every 24 hours 04/06/24 1137 04/06/24 1745        CONSULTS neurosurgery, orthopedics  Code Status: Full code  Family Communication: No family at bedside     Subjective   Pain is well-controlled   Objective    Physical Examination:  Appears in no acute distress S1-S2, regular Abdomen is soft, nontender Extremities no edema  Status is: Inpatient:             [name]   [facility] If 7PM-7AM, please contact night-coverage at [email or web address], Office  [phone]   04/13/2024, 8:51 AM  LOS: 7 days

## 2024-04-13 NOTE — NC FL2 (Signed)
 [place]  MEDICAID FL2 LEVEL OF CARE FORM     IDENTIFICATION  Patient Name: [name] Birthdate: [birth year] Sex: male Admission Date (Current Location): 04/06/2024  [facility] and [place] Number:  Producer, Television/film/video and Address:  [facility], [address] [place], [place] [address]      Provider Number: [id number]  Attending Physician Name and Address:  Drusilla Sabas RAMAN, MD  Relative Name and Phone Number:       Current Level of Care: Hospital Recommended Level of Care: Skilled Nursing Facility Prior Approval Number:    Date Approved/Denied:   PASRR Number: [id number]  Discharge Plan: SNF    Current Diagnoses: Patient Active Problem List   Diagnosis Date Noted   UTI (urinary tract infection) 04/07/2024   Subdural hematoma (HCC) 04/07/2024   Depression 04/07/2024   Dehydration 04/06/2024   History of UTI 07/30/2022   Urinary incontinence without sensory awareness 07/30/2022   Acute kidney injury 05/29/2018   Pelvic fracture (HCC) 02/10/2018   Recurrent syncope 01/23/2018   Diarrhea    Chest pain 12/08/2017   Ischemic cardiomyopathy 11/28/2017   Neurocognitive deficits 11/19/2017   Bilious vomiting    Ileus, unspecified (HCC) 11/13/2017   Sleep apnea 11/13/2017   Obesity 11/13/2017   Chronic kidney disease, stage 3a (HCC) 11/11/2017   Chronic combined systolic and diastolic CHF (congestive heart failure) (HCC) 11/11/2017   CAD (coronary artery disease) 11/11/2017   Acute ST elevation myocardial infarction (STEMI) of inferolateral wall (HCC) 11/10/2017   ST elevation myocardial infarction (STEMI) (HCC)    AKI (acute kidney injury) 01/22/2015   Lactic acidosis 01/22/2015   Syncope and collapse    HTN (hypertension), benign    Encephalopathy 09/07/2014   Osteoarthritis of shoulder 09/21/2013   Gout 12/23/2012   Hypotension 12/21/2012   Type 2 diabetes mellitus with hyperlipidemia (HCC) 12/21/2012   Hyperlipidemia    Obesity  (BMI 30-39.9)     Orientation RESPIRATION BLADDER Height & Weight     Self, Time, Situation, Place  O2 ([place] 2L) Indwelling catheter Weight: 192 lb (87.1 kg) Height:  5' 10 (177.8 cm)  BEHAVIORAL SYMPTOMS/MOOD NEUROLOGICAL BOWEL NUTRITION STATUS      Continent Diet (regular)  AMBULATORY STATUS COMMUNICATION OF NEEDS Skin   Extensive Assist Verbally Surgical wounds, Other (Comment) (closed left hip, foam dressing: lift every shift to assess and change PRN; irritant contact dermititis, right buttocks: foam dressing, lift every shift to assess and change PRN)                       Personal Care Assistance Level of Assistance  Bathing, Feeding, Dressing Bathing Assistance: Maximum assistance Feeding assistance: Limited assistance Dressing Assistance: Maximum assistance     Functional Limitations Info  Sight, Hearing Sight Info: Impaired (blind right eye) Hearing Info: Impaired (hard of hearing)      SPECIAL CARE FACTORS FREQUENCY  PT (By licensed PT), OT (By licensed OT)     PT Frequency: 5x/wk OT Frequency: 5x/wk            Contractures Contractures Info: Not present    Additional Factors Info  Code Status, Allergies, Psychotropic, Insulin  Sliding Scale Code Status Info: Full Allergies Info: NKA Psychotropic Info: Zoloft  50mg  daily at bed Insulin  Sliding Scale Info: see DC summary       Current Medications (04/13/2024):  This is the current hospital active medication list Current Facility-Administered Medications  Medication Dose Route Frequency Provider Last Rate  Last Admin   acetaminophen  (TYLENOL ) tablet 650 mg  650 mg Oral Q6H PRN Edna Toribio LABOR, MD   650 mg at 04/12/24 0156   atorvastatin  (LIPITOR ) tablet 80 mg  80 mg Oral Daily Edna Toribio LABOR, MD   80 mg at 04/12/24 1036   benzocaine (ORAJEL) 10 % mucosal gel   Mouth/Throat TID PRN Drusilla Sabas RAMAN, MD       Chlorhexidine  Gluconate Cloth 2 % PADS 6 each  6 each Topical Daily Edna Toribio LABOR,  MD   6 each at 04/12/24 1038   cyclobenzaprine (FLEXERIL) tablet 5 mg  5 mg Oral TID PRN Edna Toribio LABOR, MD       feeding supplement (ENSURE PLUS HIGH PROTEIN) liquid 237 mL  237 mL Oral BID BM Edna Toribio LABOR, MD   237 mL at 04/12/24 1622   fentaNYL  (SUBLIMAZE ) injection 25 mcg  25 mcg Intravenous Q2H PRN Edna Toribio LABOR, MD   25 mcg at 04/08/24 0059   insulin  aspart (novoLOG ) injection 0-15 Units  0-15 Units Subcutaneous TID WC Edna Toribio LABOR, MD   2 Units at 04/12/24 1623   metoprolol  succinate (TOPROL -XL) 24 hr tablet 25 mg  25 mg Oral Daily Edna Toribio LABOR, MD   25 mg at 04/12/24 1036   morphine  (PF) 2 MG/ML injection 2 mg  2 mg Intravenous Q2H PRN Mansy, Jan A, MD   2 mg at 04/12/24 2010   nitroGLYCERIN  (NITROSTAT ) SL tablet 0.4 mg  0.4 mg Sublingual Q5 min PRN Mansy, Jan A, MD   0.4 mg at 04/12/24 2052   Oral care mouth rinse  15 mL Mouth Rinse PRN Edna Toribio LABOR, MD       oxyCODONE  (Oxy IR/ROXICODONE ) immediate release tablet 5 mg  5 mg Oral Q4H PRN Edna Toribio LABOR, MD   5 mg at 04/10/24 1354   polyethylene glycol (MIRALAX / GLYCOLAX) packet 17 g  17 g Oral Daily PRN Edna Toribio LABOR, MD       sertraline  (ZOLOFT ) tablet 50 mg  50 mg Oral QHS Edna Toribio LABOR, MD   50 mg at 04/12/24 2215     Discharge Medications: Please see discharge summary for a list of discharge medications.  Relevant Imaging Results:  Relevant Lab Results:   Additional Information SS#: [id number]  [name], [place]

## 2024-04-13 NOTE — TOC Progression Note (Signed)
 Transition of Care [facility]) - Progression Note    Patient Details  Name: [name] MRN: [id number] Date of Birth: March 11, 1931  Transition of Care [facility]) CM/SW Contact  [name] [place], [place] Phone Number: 04/13/2024, 1:47 PM  Clinical Narrative:     CSW met with pt bedside and explained list of SNF bed offers. Left  list with medicare star ratings bedside. Pt's son is supposed to be visiting this evening. Pt to review list with his son to decide on choice. ICM to follow up for SNF choice.   Expected Discharge Plan: Skilled Nursing Facility Barriers to Discharge: Continued Medical Work up, English As A Second Language Teacher               Expected Discharge Plan and Services       Living arrangements for the past 2 months: Single Family Home                                       Social Drivers of Health (SDOH) Interventions SDOH Screenings   Food Insecurity: No Food Insecurity (04/07/2024)  Housing: Low Risk  (04/07/2024)  Transportation Needs: No Transportation Needs (04/07/2024)  Utilities: Not At Risk (04/07/2024)  Financial Resource Strain: Medium Risk (02/05/2018)  Physical Activity: Unknown (02/06/2018)  Social Connections: Socially Isolated (04/07/2024)  Stress: No Stress Concern Present (02/06/2018)  Tobacco Use: Medium Risk (04/11/2024)    Readmission Risk Interventions     No data to display

## 2024-04-14 DIAGNOSIS — E860 Dehydration: Secondary | ICD-10-CM | POA: Diagnosis not present

## 2024-04-14 DIAGNOSIS — I10 Essential (primary) hypertension: Secondary | ICD-10-CM | POA: Diagnosis not present

## 2024-04-14 DIAGNOSIS — S065XAA Traumatic subdural hemorrhage with loss of consciousness status unknown, initial encounter: Secondary | ICD-10-CM | POA: Diagnosis not present

## 2024-04-14 DIAGNOSIS — N1831 Chronic kidney disease, stage 3a: Secondary | ICD-10-CM | POA: Diagnosis not present

## 2024-04-14 LAB — GLUCOSE, CAPILLARY
Glucose-Capillary: 95 mg/dL (ref 70–99)
Glucose-Capillary: 156 mg/dL — ABNORMAL HIGH (ref 70–99)
Glucose-Capillary: 140 mg/dL — ABNORMAL HIGH (ref 70–99)
Glucose-Capillary: 132 mg/dL — ABNORMAL HIGH (ref 70–99)

## 2024-04-14 NOTE — TOC Progression Note (Signed)
 Transition of Care [facility]) - Progression Note    Patient Details  Name: [name] MRN: [id number] Date of Birth: 1930-06-06  Transition of Care [facility]) CM/SW Contact  [name], [place] Phone Number: 04/14/2024, 2:20 PM  Clinical Narrative:   CSW met with patient to discuss SNF offers. Patient said he was not able to review the options with his son yet, but gave permission to call and talk to his son, Ozell. CSW spoke with Ozell, he will be coming to see patient today. CSW explained SNF offers in the patient's room, and Ozell will review and discuss with the patient. CSW to follow.  UPDATE: CSW met with patient's son, Ozell, when he arrived to the hospital. Ozell reviewed bed offers and chose [place]. CSW attempted to reach [facility] to confirm bed availability, awaiting response. CSW to request insurance authorization once bed is confirmed. CSW to follow.    Expected Discharge Plan: Skilled Nursing Facility Barriers to Discharge: Continued Medical Work up, English As A Second Language Teacher               Expected Discharge Plan and Services       Living arrangements for the past 2 months: Single Family Home                                       Social Drivers of Health (SDOH) Interventions SDOH Screenings   Food Insecurity: No Food Insecurity (04/07/2024)  Housing: Low Risk  (04/07/2024)  Transportation Needs: No Transportation Needs (04/07/2024)  Utilities: Not At Risk (04/07/2024)  Financial Resource Strain: Medium Risk (02/05/2018)  Physical Activity: Unknown (02/06/2018)  Social Connections: Socially Isolated (04/07/2024)  Stress: No Stress Concern Present (02/06/2018)  Tobacco Use: Medium Risk (04/11/2024)    Readmission Risk Interventions     No data to display

## 2024-04-14 NOTE — Progress Notes (Signed)
 Physical Therapy Treatment Patient Details Name: [name] MRN: [id number] DOB: [birth year] Today's Date: 04/14/2024   History of Present Illness The pt is a 89 y.o. male who presented to [facility]06[facility]24 after falling. Pt with 2mm L frontal SDH w/o mass effect, conservative management. Also with hypotension likely 2/2 dehydration, UTI, and L subcapital femoral neck fx s/p percutaneous screw fixation 11/8. PMHx: CAD, combined diastolic and systolic HF, CKD stage III, HTN, HLD, and DM, gout.    PT Comments  Pt received in supine, Pt agreeable to OOB mobility with PTA. Pt needing mod A for bed mobility with verbal cues for sequencing, max A +2 to stand from EOB pt able to walk 6 feet before sitting in recliner. Increased O2 from 2L to 3L to maintain SpO2 in the 90s. BP dropped from last time taken, next session plan to take orthostatics. Pt DOE 3/4. Pt expressed pain and was given and ice pack for L hip, chair alarm on, RN notified. Pt would benefit from continued PT to increase LE strength and endurance with exertional activities as well as pain management.   If plan is discharge home, recommend the following: Two people to help with walking and/or transfers;Two people to help with bathing/dressing/bathroom;Direct supervision/assist for medications management;Direct supervision/assist for financial management;Assist for transportation;Assistance with feeding;Assistance with cooking/housework;Help with stairs or ramp for entrance;Supervision due to cognitive status   Can travel by private vehicle     No  Equipment Recommendations  None recommended by PT    Recommendations for Other Services       Precautions / Restrictions Precautions Precautions: Fall Restrictions LLE Weight Bearing Per Provider Order: Weight bearing as tolerated     Mobility  Bed Mobility Overal bed mobility: Needs Assistance Bed Mobility: Supine to Sit     Supine to sit: Mod assist, HOB elevated, Used rails  (Required lots of verbal cues for sequencing)     General bed mobility comments: Mod A for sequencing and physical asssit    Transfers Overall transfer level: Needs assistance Equipment used: Rolling walker (2 wheels) Transfers: Sit to/from Stand, Bed to chair/wheelchair/BSC Sit to Stand: From elevated surface, +2 physical assistance, +2 safety/equipment, Max assist   Step pivot transfers: +2 physical assistance, +2 safety/equipment, Mod assist       General transfer comment: Pt stood EOB with max A +2 and walked 6 ft to chair needing mod A +2 assit needed multimodal cues for sequencing.    Ambulation/Gait Ambulation/Gait assistance: +2 physical assistance, +2 safety/equipment, Mod assist Gait Distance (Feet): 6 Feet Assistive device: Rolling walker (2 wheels) Gait Pattern/deviations: Step-to pattern, Narrow base of support, Decreased weight shift to left, Decreased stride length, Decreased stance time - left, Trunk flexed Gait velocity: decreased     General Gait Details: Pt required mod A +2 for gait, required multimodal cues for sequencing and posture. DOE 3/4.   Stairs             Wheelchair Mobility     Tilt Bed    Modified Rankin (Stroke Patients Only)       Balance Overall balance assessment: Needs assistance Sitting-balance support: Single extremity supported, Feet supported Sitting balance-Leahy Scale: Fair Sitting balance - Comments: Seated EOB, CGA safety   Standing balance support: Bilateral upper extremity supported, During functional activity, Reliant on assistive device for balance Standing balance-Leahy Scale: Poor Standing balance comment: needs mod A +2 with reliance on RW in stance  Communication Communication Communication: Impaired Factors Affecting Communication: Hearing impaired  Cognition Arousal: Alert Behavior During Therapy: Flat affect, WFL for tasks assessed/performed   PT - Cognitive  impairments: No family/caregiver present to determine baseline, Orientation, Awareness, Memory, Initiation, Sequencing, Problem solving, Safety/Judgement                         Following commands: Impaired Following commands impaired: Follows one step commands inconsistently, Follows one step commands with increased time    Cueing Cueing Techniques: Verbal cues, Tactile cues, Visual cues  Exercises General Exercises - Lower Extremity Long Arc Quad: AROM, 5 reps, Right, Left    General Comments General comments (skin integrity, edema, etc.): Pt SpO2 83-86 on 2L O2, increased to 3L and SpO2 was between 92-97%. Pt BP taken in sitting after transfer pt felt clamy, 109/71 (83) HR 92.      Pertinent Vitals/Pain Pain Assessment Pain Assessment: PAINAD Faces Pain Scale: Hurts even more Breathing: occasional labored breathing, short period of hyperventilation Negative Vocalization: occasional moan/groan, low speech, negative/disapproving quality Facial Expression: facial grimacing Body Language: tense, distressed pacing, fidgeting Consolability: distracted or reassured by voice/touch PAINAD Score: 6 Pain Location: LLE Pain Descriptors / Indicators: Discomfort, Grimacing, Moaning Pain Intervention(s): Limited activity within patient's tolerance, Monitored during session, Premedicated before session, Ice applied    Home Living                          Prior Function            PT Goals (current goals can now be found in the care plan section) Acute Rehab PT Goals Patient Stated Goal: to return home PT Goal Formulation: With patient Time For Goal Achievement: 04/26/24 Progress towards PT goals: Progressing toward goals    Frequency    Min 2X/week      PT Plan      Co-evaluation              AM-PAC PT 6 Clicks Mobility   Outcome Measure  Help needed turning from your back to your side while in a flat bed without using bedrails?: A Lot Help  needed moving from lying on your back to sitting on the side of a flat bed without using bedrails?: A Lot Help needed moving to and from a bed to a chair (including a wheelchair)?: A Lot Help needed standing up from a chair using your arms (e.g., wheelchair or bedside chair)?: Total Help needed to walk in hospital room?: A Lot Help needed climbing 3-5 steps with a railing? : Total 6 Click Score: 10    End of Session Equipment Utilized During Treatment: Gait belt;Oxygen Activity Tolerance: Patient limited by fatigue;Patient limited by pain Patient left: in chair;with call bell/phone within reach;with chair alarm set Nurse Communication: Mobility status (O2 on 3L due to desating into 80s with 2L) PT Visit Diagnosis: Unsteadiness on feet (R26.81);Other abnormalities of gait and mobility (R26.89);Muscle weakness (generalized) (M62.81);Pain Pain - Right/Left: Left Pain - part of body: Hip     Time: 8365-8294 PT Time Calculation (min) (ACUTE ONLY): 31 min  Charges:    $Gait Training: 8-22 mins $Therapeutic Activity: 8-22 mins PT General Charges $$ ACUTE PT VISIT: 1 Visit                     Johnnie Gaynelle JACQUE Johnnie Kemya Shed 04/14/2024, 5:47 PM

## 2024-04-14 NOTE — Progress Notes (Signed)
 [facility]  PROGRESS NOTE  [name] FMW:[id number] DOB: February 03, 1931 DOA: 04/06/2024 PCP: Maree Leni Edyth DELENA, MD   Brief HPI:   89 yo male with the past medical history of coronary artery disease, heart failure, CKD and T2DM who presented after a mechanical fall. On the day of hospitalization he was diagnosed with urinary tract infection by his primary care provider. Apparently at home he had not being eating well, very poor oral intake, and having hallucinations. At home sustained a mechanical fall while trying to put is clothes, leaned forward to far. No head trauma, landing over his left side.  EMS was called, he was found with blood pressure 108/60, he was placed on 2 L/min per [place] with 02 saturation 95%, then transported to the ED.  On his initial physical examination in the ED, his blood pressure was 64/48, placed on peripheral norepinephrine with blood pressure 111/80, HR 83, RR 17 and 02 saturation 85% on room air    CT cervical spine with no acute fracture or traumatic malalignment.  CT chest abdomen and pelvis with no acute traumatic injury identified in the chest, abdomen or pelvis.  No acute rib fracture identified.  CT head with thin subdural hematoma over the left frontal convexity without mass effect.  Mild chronic small vessel ischemic disease.     Assessment/Plan:    Left hip pain/subcapital left femoral neck fracture -Patient complained of left hip pain in the hospital, x-ray of hip with pelvis on left showed findings suggesting subcapital left femoral neck fracture - CT hip obtained showed impacted subcapital fracture of the left femoral head- neck junction -Orthopedics was consulted and recommended surgery -Cardiology was consulted for preop evaluation, cardiology did not recommend surgery given multiple comorbidities and significant cardiac history -Orthopedic surgery Dr. Edna recommended cannulated screw fixation which is 20 minutes surgery, goal to  stabilize the fracture for pain control and mobility - Family  decided to go ahead with the procedure - Underwent fixation of femoral neck fracture using screw  - Orthopedics following - Plan to go to skilled nursing facility for rehab - No DVT prophylaxis ordered due to SDH   Preop evaluation/history of CAD/chronic combined systolic and diastolic CHF - Patient has significant history of CAD, multivessel disease, STEMI in 2019, treated with DES x 3 to proximal left circumflex, proximal OMI and distal OMI with PCI to mid distal RCA with DES x 2 - Also has combined systolic and diastolic heart failure with last echo from 2019 showed EF of 40 to 45%; patient's BNP obtained today was 125 -Revised cardiac risk index score of 3; 10% risk of major cardiac event -Cardiology was consulted; deemed patient not a candidate for surgery given significant cardiac history and multiple comorbidities - After discussion with family, they agreed for procedure - Echocardiogram from 04/09/2024 showed EF of 60 to 65% no regional wall motion abnormalities  Subdural hematoma -Repeat CT head with unchanged ten 2 mm thick subdural hematoma along the left cerebral convexity, no mass effect - Conservative management per neurosurgery,  Hypotension - Patient had severe hypotension requiring vasopressors; likely in setting of dehydration - No clinical signs of sepsis -  off vasopressors   Hypertension - Patient blood pressure is now elevated - Also found to have sinus tachycardia - He was taking metoprolol  at home,  - Started back on  metoprolol  25 mg daily  CKD stage IIIa - Creatinine has improved with IV fluids -Creatinine is 1.17  UTI - Started on  Rocephin  - Urine culture from 04/06/2024 showed no growth - Rocephin  has been discontinued  Diabetes mellitus type 2 - Continue sliding scale insulin  NovoLog  - CBG well-controlled    DVT prophylaxis: SCDs  Medications     atorvastatin   80 mg Oral Daily    Chlorhexidine  Gluconate Cloth  6 each Topical Daily   feeding supplement  237 mL Oral BID BM   insulin  aspart  0-15 Units Subcutaneous TID WC   metoprolol  succinate  25 mg Oral Daily   sertraline   50 mg Oral QHS     Data Reviewed:   CBG:  Recent Labs  Lab 04/13/24 1127 04/13/24 1607 04/13/24 2130 04/14/24 0608 04/14/24 1106  GLUCAP 173* 91 112* 95 156*    SpO2: 100 % O2 Flow Rate (L/min): 2 L/min    Vitals:   04/14/24 0025 04/14/24 0416 04/14/24 0742 04/14/24 1110  BP: 118/78 120/78 126/78 123/72  Pulse: 81 76 84 97  Resp: 18 18 18 18   Temp: 98.2 F (36.8 C) 97.9 F (36.6 C) 98.8 F (37.1 C) 98.5 F (36.9 C)  TempSrc:   Oral Oral  SpO2: 100% 95% 100% 100%  Weight:      Height:          Data Reviewed:  Basic Metabolic Panel: Recent Labs  Lab 04/08/24 0536  NA 138  K 3.6  CL 105  CO2 21*  GLUCOSE 118*  BUN 13  CREATININE 1.17  CALCIUM  8.5*  MG 1.8    CBC: Recent Labs  Lab 04/08/24 0156  WBC 7.6  HGB 12.7*  HCT 38.9*  MCV 89.0  PLT 126*    LFT No results for input(s): AST, ALT, ALKPHOS, BILITOT, PROT, ALBUMIN in the last 168 hours.    Antibiotics: Anti-infectives (From admission, onward)    Start     Dose/Rate Route Frequency Ordered Stop   04/11/24 2200  ceFAZolin  (ANCEF ) IVPB 2g/100 mL premix        2 g 200 mL/hr over 30 Minutes Intravenous Every 8 hours 04/11/24 1707 04/12/24 0930   04/11/24 0715  ceFAZolin  (ANCEF ) IVPB 2g/100 mL premix        2 g 200 mL/hr over 30 Minutes Intravenous On call to O.R. 04/11/24 0712 04/11/24 0823   04/07/24 1215  cefTRIAXone  (ROCEPHIN ) 1 g in sodium chloride  0.9 % 100 mL IVPB  Status:  Discontinued        1 g 200 mL/hr over 30 Minutes Intravenous Every 24 hours 04/07/24 1205 04/08/24 1651   04/06/24 1300  vancomycin  (VANCOREADY) IVPB 2000 mg/400 mL        2,000 mg 200 mL/hr over 120 Minutes Intravenous  Once 04/06/24 1250 04/06/24 1645   04/06/24 1300  piperacillin -tazobactam  (ZOSYN ) IVPB 3.375 g        3.375 g 100 mL/hr over 30 Minutes Intravenous  Once 04/06/24 1250 04/06/24 1612   04/06/24 1145  cefTRIAXone  (ROCEPHIN ) 2 g in sodium chloride  0.9 % 100 mL IVPB  Status:  Discontinued        2 g 200 mL/hr over 30 Minutes Intravenous Every 24 hours 04/06/24 1137 04/06/24 1745        CONSULTS neurosurgery, orthopedics  Code Status: Full code  Family Communication: No family at bedside     Subjective   Denies any complaints.  Pain well-controlled   Objective    Physical Examination:  General-appears in no acute distress Heart-S1-S2, regular, no murmur auscultated Lungs-clear to auscultation bilaterally, no wheezing or crackles auscultated  Abdomen-soft, nontender, no organomegaly Extremities-no edema in the lower extremities Neuro-alert, oriented x3, no focal deficit noted  Status is: Inpatient:             [name]   [facility] If 7PM-7AM, please contact night-coverage at [email or web address], Office  [phone]   04/14/2024, 12:42 PM  LOS: 8 days

## 2024-04-15 DIAGNOSIS — E860 Dehydration: Secondary | ICD-10-CM | POA: Diagnosis not present

## 2024-04-15 LAB — GLUCOSE, CAPILLARY
Glucose-Capillary: 90 mg/dL (ref 70–99)
Glucose-Capillary: 147 mg/dL — ABNORMAL HIGH (ref 70–99)
Glucose-Capillary: 120 mg/dL — ABNORMAL HIGH (ref 70–99)
Glucose-Capillary: 178 mg/dL — ABNORMAL HIGH (ref 70–99)

## 2024-04-15 MED ORDER — ENSURE PLUS HIGH PROTEIN PO LIQD: 237.0000 mL | Freq: Two times a day (BID) | ORAL | Status: AC

## 2024-04-15 MED ORDER — POLYETHYLENE GLYCOL 3350 17 G PO PACK: 17.0000 g | PACK | Freq: Every day | ORAL | Status: AC | PRN

## 2024-04-15 MED ORDER — BENZOCAINE 10 % MT GEL: Freq: Three times a day (TID) | OROMUCOSAL | Status: AC | PRN

## 2024-04-15 MED ORDER — OXYCODONE HCL 5 MG PO TABS: 5.0000 mg | ORAL_TABLET | ORAL | 0 refills | Status: AC | PRN

## 2024-04-15 NOTE — Progress Notes (Addendum)
 Patient going to [place], rm [phone]. Report given to [facility], [facility].   Per, Dr. Pokhrel:discontinue foley. Primary RN made aware.  Per [employer]: Patient can still be discharged to [facility] if he voids before midnight. MD, primary RN, and social worker made aware.   Pt's son, Ozell updated on plan.

## 2024-04-15 NOTE — Discharge Summary (Addendum)
 Physician Discharge Summary  [name] FMW:[id number] DOB: 10-20-1930 DOA: 04/06/2024  PCP: Maree Leni Edyth DELENA, MD  Admit date: 04/06/2024 Discharge date: 04/16/2024  Admitted From: Home  Discharge disposition: Skilled nursing facility   Recommendations for Outpatient Follow-Up:   Follow up with your primary care provider in one week.  Check CBC, BMP, magnesium in the next visit   Discharge Diagnosis:   Principal Problem:   Dehydration Active Problems:   HTN (hypertension), benign   CAD (coronary artery disease)   Chronic kidney disease, stage 3a (HCC)   UTI (urinary tract infection)   Gout   Type 2 diabetes mellitus with hyperlipidemia (HCC)   Subdural hematoma (HCC)   Depression   Obesity (BMI 30-39.9)    Discharge Condition: Improved.  Diet recommendation:   Regular.  Wound care: None.  Code status: Full.   History of Present Illness:   89 yo male with past medical history of coronary artery disease, heart failure, CKD and type 2 diabetes presented to the hospital after a mechanical fall. On the day of hospitalization, he was diagnosed with urinary tract infection by his primary care provider. Apparently at home he had not being eating well, very poor oral intake, and having hallucinations. At home sustained a mechanical fall while trying to put is clothes, leaned forward to far. EMS was called, he was found with blood pressure 108/60, he was placed on 2 L/min per [place] with 02 saturation 95%, then transported to the ED. in the ED initial blood pressure was 64/48 and was put on norepinephrine. CT cervical spine without any fracture. CT chest abdomen pelvis did not show any acute findings. CT head however showed thin subdural hematoma over the left frontal convexity. Patient was then admitted hospital for further evaluation and treatment.    Hospital Course:   Following conditions were addressed during hospitalization as listed below,  Left hip  pain/subcapital left femoral neck fracture x-ray of hip with pelvis on left showed findings suggesting subcapital left femoral neck fracture.  CT hip showed impacted subcapital fracture.  Orthopedic was consulted.  Patient underwent cardiac evaluation by cardiology who did not recommend surgery.  Due to high cardiac risk.  Review of 2D echocardiogram showed LV ejection fraction of 60 to 65% with no regional wall motion abnormality.  Orthopedic surgery recommended cannulated screw fixation to stabilize the fracture for pain control and mobility and patient underwent fixation of the neck fracture using the screw with orthopedics.  Plan is skilled nursing facility placement at this time.  No plan for DVT prophylaxis due to SDH.  Hold off with aspirin    Subdural hematoma Repeat CT head scan without any changes.  Neurosurgery saw the patient and recommended conservative treatment.    Hypotension Patient initially initially had severe hypotension requiring vasopressors; likely in setting of dehydration.  Now resolved.  Off vasopressors.  No clinical signs of sepsis.   Hypertension Controlled on metoprolol  from home.   CKD stage IIIa Received IV fluids.  Creatinine latest at 1.1.  No recent labs   Urinary tract infection Urine culture from 04/06/2024 was negative.  Initially was on Rocephin  which has been discontinued   Diabetes mellitus type 2 Continue sliding scale insulin , Accu-Cheks, diabetic diet.  Latest POC glucose of 91  Disposition.  At this time, patient is stable for disposition to skilled nursing facility  Medical Consultants:   Orthopedic  Procedures:    Percutaneous screw fixation of the left femoral neck fracture on 04/11/2024  Subjective:   Today, patient was seen and examined at bedside.  Was able to urinate on his own.  Status post Foley catheter removal.  Discharge Exam:   Vitals:   04/16/24 0006 04/16/24 0448  BP: 120/78 127/83  Pulse: 76 77  Resp: 17 17  Temp:  (!) 97.5 F (36.4 C) (!) 97.5 F (36.4 C)  SpO2: 90% 100%   Vitals:   04/15/24 2123 04/16/24 0006 04/16/24 0448 04/16/24 0500  BP: 106/70 120/78 127/83   Pulse: 85 76 77   Resp: 17 17 17    Temp: 100.3 F (37.9 C) (!) 97.5 F (36.4 C) (!) 97.5 F (36.4 C)   TempSrc: Oral Oral Oral   SpO2: 100% 90% 100%   Weight:    87.5 kg  Height:       Body mass index is 27.68 kg/m.  General: Alert awake, not in obvious distress, on nasal cannula, elderly male, HENT: pupils equally reacting to light,  No scleral pallor or icterus noted. Oral mucosa is moist.  Chest:  Clear breath sounds.  Diminished breath sounds bilaterally. No crackles or wheezes.  CVS: S1 &S2 heard. No murmur.  Regular rate and rhythm. Abdomen: Soft, nontender, nondistended.  Bowel sounds are heard.   Extremities: No cyanosis, clubbing, left hip area with dressing Psych: Alert, awake and oriented, normal mood CNS:  No cranial nerve deficits.  Moves all extremities. Skin: Warm and dry.  No rashes noted.  The results of significant diagnostics from this hospitalization (including imaging, microbiology, ancillary and laboratory) are listed below for reference.     Diagnostic Studies:   CT Head Wo Contrast Result Date: 04/06/2024 EXAM: CT HEAD WITHOUT CONTRAST 04/06/2024 11:14:10 PM TECHNIQUE: CT of the head was performed without the administration of intravenous contrast. Automated exposure control, iterative reconstruction, and/or weight based adjustment of the mA/kV was utilized to reduce the radiation dose to as low as reasonably achievable. COMPARISON: CT head earlier today. CLINICAL HISTORY: Repeat, 2mm SDH FINDINGS: BRAIN AND VENTRICLES: Unchanged thin 2 mm thick subdural hematoma along left cerebral convexity. No mass effect. No evidence of acute infarct. No hydrocephalus. No extra-axial collection. No mass effect or midline shift. Cerebral atrophy. Remote lacunar infarcts in left basal ganglia. Unchanged white matter  hypodensities compatible with mild chronic microvascular ischemic change. ORBITS: No acute abnormality. SINUSES: No acute abnormality. SOFT TISSUES AND SKULL: No acute soft tissue abnormality. No skull fracture. IMPRESSION: 1. Unchanged thin 2 mm thick subdural hematoma along the left cerebral convexity. No mass effect. Electronically signed by: Gilmore Molt MD 04/06/2024 11:27 PM EST RP Workstation: [id number]   CT CHEST ABDOMEN PELVIS W CONTRAST Result Date: 04/06/2024 EXAM: CT CHEST, ABDOMEN AND PELVIS WITH CONTRAST 04/06/2024 01:43:40 PM TECHNIQUE: CT of the chest, abdomen and pelvis was performed with the administration of 75 mL of iohexol  (OMNIPAQUE ) 350 MG/ML injection. Multiplanar reformatted images are provided for review. Automated exposure control, iterative reconstruction, and/or weight based adjustment of the mA/kV was utilized to reduce the radiation dose to as low as reasonably achievable. COMPARISON: None available. CLINICAL HISTORY: Polytrauma, blunt. History he fell on floor. No LOC/head injury. Pt c/o left sided shoulder, hip, and ribcage pain. FINDINGS: CHEST: MEDIASTINUM AND LYMPH NODES: Heart and pericardium are unremarkable. The central airways are clear. No mediastinal, hilar or axillary lymphadenopathy. No aortic injury. LUNGS AND PLEURA: Suboptimal degradation of the thoracic imaging somewhat limits rib evaluation but no acute fracture identified. No pneumothorax or pulmonary contusion. No pleural effusion. ABDOMEN AND PELVIS: LIVER:  The liver is unremarkable. GALLBLADDER AND BILE DUCTS: Gallbladder is unremarkable. No biliary ductal dilatation. SPLEEN: No acute abnormality. PANCREAS: No acute abnormality. ADRENAL GLANDS: No acute abnormality. KIDNEYS, URETERS AND BLADDER: Large simple fluid attenuation cyst of the left kidney on post-contrast imaging measures 5.3 cm. Similar smaller fluid density cysts in the right kidney measures 2.7 cm. Per consensus, no follow-up is needed for  simple Bosniak type 1 and 2 renal cysts, unless the patient has a malignancy history or risk factors. No stones in the kidneys or ureters. No hydronephrosis. No perinephric or periureteral stranding. Urinary bladder is unremarkable. GI AND BOWEL: Stomach demonstrates no acute abnormality. There is no bowel obstruction. REPRODUCTIVE ORGANS: No acute abnormality. PERITONEUM AND RETROPERITONEUM: No ascites. No free air. VASCULATURE: Aorta is normal in caliber. ABDOMINAL AND PELVIS LYMPH NODES: No lymphadenopathy. REPRODUCTIVE ORGANS: No acute abnormality. BONES AND SOFT TISSUES: Suboptimal degradation of the thoracic imaging somewhat limits rib evaluation but no acute fracture identified. Pakind fracture identified. Spinal cord stimulator device noted in the mid thoracic spine. Degenerative changes of the lumbar spine most severe at L1-L2. No pelvic fracture or hip fracture. No focal soft tissue abnormality. IMPRESSION: 1. No acute traumatic injury identified in the chest, abdomen, or pelvis. 2. Suboptimal thoracic imaging limits rib evaluation; no acute rib fracture identified. Electronically signed by: Norleen Boxer MD 04/06/2024 02:52 PM EST RP Workstation: [id number]   CT Head Wo Contrast Result Date: 04/06/2024 EXAM: CT HEAD WITHOUT CONTRAST 04/06/2024 01:43:40 PM TECHNIQUE: CT of the head was performed without the administration of intravenous contrast. Automated exposure control, iterative reconstruction, and/or weight based adjustment of the mA/kV was utilized to reduce the radiation dose to as low as reasonably achievable. COMPARISON: CT head 09/20/2021. CLINICAL HISTORY: Head trauma, minor (Age >= 65y). FINDINGS: BRAIN AND VENTRICLES: A new thin hyperdense subdural hematoma over the left lateral frontal convexity measures up to 2 mm in thickness without associated mass effect. No acute infarct, mass, or hydrocephalus is evident. There is moderate cerebral atrophy. Patchy cerebral white matter hypodensities  are stable to mildly increased in nonspecific but compatible with mild chronic small vessel ischemic disease. Chronic lacunar infarcts are noted in the basal ganglia. Calcified atherosclerosis at the skull base. ORBITS: Bilateral cataract extraction. SINUSES: No acute abnormality. SOFT TISSUES AND SKULL: No acute soft tissue abnormality. No skull fracture. Emergent findings were communicated to Dr. Sharlet by telephone on 04/06/2024 at 2:39 pm. IMPRESSION: 1. Thin subdural hematoma over the left frontal convexity without mass effect. 2. Mild chronic small vessel ischemic disease. Electronically signed by: Dasie Hamburg MD 04/06/2024 02:46 PM EST RP Workstation: [id number]   CT Cervical Spine Wo Contrast Result Date: 04/06/2024 EXAM: CT CERVICAL SPINE WITHOUT CONTRAST 04/06/2024 01:43:40 PM TECHNIQUE: CT of the cervical spine was performed without the administration of intravenous contrast. Multiplanar reformatted images are provided for review. Automated exposure control, iterative reconstruction, and/or weight based adjustment of the mA/kV was utilized to reduce the radiation dose to as low as reasonably achievable. COMPARISON: CT cervical spine 09/20/2021 CLINICAL HISTORY: FINDINGS: CERVICAL SPINE: BONES AND ALIGNMENT: Chronic straightening of the normal cervical lordosis and trace anterolisthesis of C4 on C5. No acute fracture or suspicious lesion. Unchanged chronic fracture or developmental incomplete fusion of the posterior C1 ring on the right. DEGENERATIVE CHANGES: Widespread advanced cervical disc degeneration. Asymmetrically advanced facet arthrosis on the left at C2-C3 and on the right at C4-C5. No evidence of high grade spinal canal stenosis. SOFT TISSUES: No prevertebral soft tissue swelling. IMPRESSION:  1. No acute cervical spine fracture or traumatic malalignment. Electronically signed by: Dasie Hamburg MD 04/06/2024 02:34 PM EST RP Workstation: [id number]   DG Chest Port 1 View if patient is in a  treatment room. Result Date: 04/06/2024 EXAM: 1 VIEW(S) XRAY OF THE CHEST 04/06/2024 11:41:00 AM COMPARISON: 01/04/2023 CLINICAL HISTORY: Suspected Sepsis FINDINGS: LUNGS AND PLEURA: Low lung volumes. Diffuse interstitial opacities, similar to previous exam which is favored to represent chronic interstitial lung disease. No pulmonary edema. No pleural effusion. No pneumothorax. HEART AND MEDIASTINUM: Spinal cord stimulator noted. No acute abnormality of the cardiac and mediastinal silhouettes. BONES AND SOFT TISSUES: Right shoulder arthroplasty noted. No acute osseous abnormality. IMPRESSION: 1. No acute cardiopulmonary process identified. Electronically signed by: Waddell Calk MD 04/06/2024 01:15 PM EST RP Workstation: [id number]     Labs:   Basic Metabolic Panel: Recent Labs  Lab 04/16/24 0450  NA 138  K 4.7  CL 99  CO2 28  GLUCOSE 108*  BUN 21  CREATININE 1.12  CALCIUM  8.7*  MG 1.8   GFR Estimated Creatinine Clearance: 44.4 mL/min (by C-G formula based on SCr of 1.12 mg/dL). Liver Function Tests: No results for input(s): AST, ALT, ALKPHOS, BILITOT, PROT, ALBUMIN in the last 168 hours. No results for input(s): LIPASE, AMYLASE in the last 168 hours. No results for input(s): AMMONIA in the last 168 hours. Coagulation profile No results for input(s): INR, PROTIME in the last 168 hours.  CBC: Recent Labs  Lab 04/16/24 0450  WBC 6.9  HGB 9.6*  HCT 29.8*  MCV 89.8  PLT 172   Cardiac Enzymes: No results for input(s): CKTOTAL, CKMB, CKMBINDEX, TROPONINI in the last 168 hours. BNP: Invalid input(s): POCBNP CBG: Recent Labs  Lab 04/15/24 0802 04/15/24 1129 04/15/24 1618 04/15/24 2123 04/16/24 0631  GLUCAP 90 147* 120* 178* 91   D-Dimer No results for input(s): DDIMER in the last 72 hours. Hgb A1c No results for input(s): HGBA1C in the last 72 hours. Lipid Profile No results for input(s): CHOL, HDL, LDLCALC, TRIG,  CHOLHDL, LDLDIRECT in the last 72 hours. Thyroid  function studies No results for input(s): TSH, T4TOTAL, T3FREE, THYROIDAB in the last 72 hours.  Invalid input(s): FREET3 Anemia work up No results for input(s): VITAMINB12, FOLATE, FERRITIN, TIBC, IRON, RETICCTPCT in the last 72 hours. Microbiology Recent Results (from the past 240 hours)  Culture, blood (Routine x 2)     Status: None   Collection Time: 04/06/24 11:30 AM   Specimen: BLOOD RIGHT HAND  Result Value Ref Range Status   Specimen Description BLOOD RIGHT HAND  Final   Special Requests   Final    BOTTLES DRAWN AEROBIC AND ANAEROBIC Blood Culture adequate volume   Culture   Final    NO GROWTH 5 DAYS Performed at [facility], [address], [place], [place] [address]    Report Status 04/11/2024 FINAL  Final  Culture, blood (Routine x 2)     Status: None   Collection Time: 04/06/24 11:55 AM   Specimen: BLOOD  Result Value Ref Range Status   Specimen Description BLOOD LEFT ANTECUBITAL  Final   Special Requests   Final    BOTTLES DRAWN AEROBIC AND ANAEROBIC Blood Culture adequate volume   Culture   Final    NO GROWTH 5 DAYS Performed at [facility], [address] [place], [place] [address]    Report Status 04/11/2024 FINAL  Final  Urine Culture (for pregnant, neutropenic or urologic patients or patients with  an indwelling urinary catheter)     Status: None   Collection Time: 04/06/24  2:50 PM   Specimen: Urine, Catheterized  Result Value Ref Range Status   Specimen Description URINE, CATHETERIZED  Final   Special Requests NONE  Final   Culture   Final    NO GROWTH Performed at [facility], [address], [place], [place] [address]    Report Status 04/07/2024 FINAL  Final  MRSA Next Gen by PCR, Nasal     Status: None   Collection Time: 04/06/24  3:28 PM   Specimen: Nasal Mucosa; Nasal Swab  Result Value Ref Range Status   MRSA by PCR Next Gen NOT DETECTED NOT  DETECTED Final    Comment: (NOTE) The GeneXpert MRSA Assay (FDA approved for NASAL specimens only), is one component of a comprehensive MRSA colonization surveillance program. It is not intended to diagnose MRSA infection nor to guide or monitor treatment for MRSA infections. Test performance is not FDA approved in patients less than 65 years old. Performed at [facility], [address], [place], [place] [address]   Surgical PCR screen     Status: None   Collection Time: 04/10/24  2:02 AM   Specimen: Nasal Mucosa; Nasal Swab  Result Value Ref Range Status   MRSA, PCR NEGATIVE NEGATIVE Final   Staphylococcus aureus NEGATIVE NEGATIVE Final    Comment: (NOTE) The Xpert SA Assay (FDA approved for NASAL specimens in patients 17 years of age and older), is one component of a comprehensive surveillance program. It is not intended to diagnose infection nor to guide or monitor treatment. Performed at [facility], [address], [place], [place] [address]      Discharge Instructions:   Discharge Instructions     Call MD for:  redness, tenderness, or signs of infection (pain, swelling, redness, odor or green/yellow discharge around incision site)   Complete by: As directed    Call MD for:  severe uncontrolled pain   Complete by: As directed    Call MD for:  temperature >100.4   Complete by: As directed    Diet general   Complete by: As directed    Discharge instructions   Complete by: As directed    Follow-up with your primary care provider at the skilled nursing facility in 1 week.  Follow-up with orthopedics as outpatient as scheduled by the clinic.  Check blood work at that time.   Increase activity slowly   Complete by: As directed    No wound care   Complete by: As directed       Allergies as of 04/16/2024   No Known Allergies      Medication List     STOP taking these medications    aspirin  EC 81 MG tablet   Metamucil Plus Calcium  Caps        TAKE these medications    acetaminophen  500 MG tablet Commonly known as: TYLENOL  Take 1,000 mg by mouth every 6 (six) hours as needed for mild pain (pain score 1-3) or headache.   amLODipine  5 MG tablet Commonly known as: NORVASC  Take 1 tablet (5 mg total) by mouth daily.   atorvastatin  80 MG tablet Commonly known as: LIPITOR  Take 80 mg by mouth at bedtime.   benzocaine 10 % mucosal gel Commonly known as: ORAJEL Use as directed in the mouth or throat 3 (three) times daily as needed for mouth pain.   diclofenac  Sodium 1 %  Gel Commonly known as: VOLTAREN  Apply 2-4 g topically 2 (two) times daily as needed (Shoulder and joint pain).   docusate sodium 100 MG capsule Commonly known as: COLACE Take 100 mg by mouth 2 (two) times daily.   feeding supplement Liqd Take 237 mLs by mouth 2 (two) times daily between meals.   furosemide  20 MG tablet Commonly known as: LASIX  Take 20 mg by mouth daily.   iVIZIA Dry Eyes 0.5 % Soln Generic drug: Povidone (PF) Place 1-2 drops into both eyes every 8 (eight) hours as needed (dry eyes/eye irritations.).   loperamide  2 MG capsule Commonly known as: IMODIUM  Take 2-4 mg by mouth as needed for diarrhea or loose stools.   Menthol -Camphor 16-11 % Crea Apply 1 application  topically daily as needed (knee pain).   metoprolol  succinate 25 MG 24 hr tablet Commonly known as: TOPROL -XL TAKE 1 TABLET BY MOUTH EVERY DAY   nitroGLYCERIN  0.4 MG SL tablet Commonly known as: NITROSTAT  Dissolve 1 tab under tongue as needed for chest pain. May repeat every 5 minutes x 2 more doses. If no relief call 9-1-1.   oxyCODONE  5 MG immediate release tablet Commonly known as: Oxy IR/ROXICODONE  Take 1 tablet (5 mg total) by mouth every 4 (four) hours as needed for severe pain (pain score 7-10).   polyethylene glycol 17 g packet Commonly known as: MIRALAX / GLYCOLAX Take 17 g by mouth daily as needed for moderate constipation.   sertraline  50 MG  tablet Commonly known as: ZOLOFT  Take 1 tablet (50 mg total) by mouth at bedtime. What changed: how much to take   trimethoprim  100 MG tablet Commonly known as: TRIMPEX  Take 100 mg by mouth daily.        Follow-up Information     Edna Toribio LABOR, MD Follow up in 2 week(s).   Specialty: Orthopedic Surgery Contact information: [address] [place] [address] [phone]                  Time coordinating discharge: 39 minutes  Signed:  Chozen Latulippe  [facility] 04/16/2024, 9:47 AM

## 2024-04-15 NOTE — TOC Progression Note (Signed)
 Transition of Care [facility]) - Progression Note    Patient Details  Name: [name] MRN: [id number] Date of Birth: 1931-05-16  [facility] CM/SW Contact  [name], [place] Phone Number: 04/15/2024, 11:29 AM  Clinical Narrative:   CSW received update from [facility] that bed is available for patient. CSW contacted CMA to request insurance authorization. Awaiting approval. CSW to follow.    Expected Discharge Plan: Skilled Nursing Facility Barriers to Discharge: Continued Medical Work up, English As A Second Language Teacher               Expected Discharge Plan and Services       Living arrangements for the past 2 months: Single Family Home                                       Social Drivers of Health (SDOH) Interventions SDOH Screenings   Food Insecurity: No Food Insecurity (04/07/2024)  Housing: Low Risk  (04/07/2024)  Transportation Needs: No Transportation Needs (04/07/2024)  Utilities: Not At Risk (04/07/2024)  Financial Resource Strain: Medium Risk (02/05/2018)  Physical Activity: Unknown (02/06/2018)  Social Connections: Socially Isolated (04/07/2024)  Stress: No Stress Concern Present (02/06/2018)  Tobacco Use: Medium Risk (04/11/2024)    Readmission Risk Interventions     No data to display

## 2024-04-15 NOTE — TOC Transition Note (Addendum)
 Transition of Care [facility]) - Discharge Note   Patient Details  Name: [name] MRN: [id number] Date of Birth: 1931/02/06  [facility] CM/SW Contact:  [name], LCSW Phone Number: 04/15/2024, 4:31 PM   Clinical Narrative:   Patient received insurance approval to admit to SNF, and [facility] can accept today. CSW updated MD, patient is stable for discharge. Discharge information sent to [facility], and son, [name], notified, he is in agreement. Transport arranged with [facility] for next available.  Nurse to call report to [phone], Room 223.   UPDATE: CSW contacted by RN that patient's foley needs DC and needs void trial. CSW coordinated with [facility] on the latest that patient could be accepted. Per [facility], as long as patient would arrive before midnight, he can still come today. CSW updated medical team, and contacted [facility] for patient to be placed on Will Call. RN to call [facility] to update on whether patient has voided and if he is ready for pickup. Number for [facility] is [phone]. RN has updated patient's son on barrier to discharge. CSW to follow.    Final next level of care: Skilled Nursing Facility Barriers to Discharge: Barriers Resolved   Patient Goals and CMS Choice            Discharge Placement              Patient chooses bed at: [facility] Patient to be transferred to facility by: [facility] Name of family member notified: [name] Patient and family notified of of transfer: 04/15/24  Discharge Plan and Services Additional resources added to the After Visit Summary for                                       Social Drivers of Health (SDOH) Interventions SDOH Screenings   Food Insecurity: No Food Insecurity (04/07/2024)  Housing: Low Risk  (04/07/2024)  Transportation Needs: No Transportation Needs (04/07/2024)  Utilities: Not At Risk (04/07/2024)  Financial Resource Strain: Medium Risk (02/05/2018)  Physical  Activity: Unknown (02/06/2018)  Social Connections: Socially Isolated (04/07/2024)  Stress: No Stress Concern Present (02/06/2018)  Tobacco Use: Medium Risk (04/11/2024)     Readmission Risk Interventions     No data to display

## 2024-04-15 NOTE — Progress Notes (Signed)
 Occupational Therapy Treatment Patient Details Name: [name] MRN: [id number] DOB: [birth year] Today's Date: 04/15/2024   History of present illness The pt is a 89 y.o. male who presented to [facility] 04/06/[facility] after falling. Pt with 2mm L frontal SDH w/o mass effect, conservative management. Also with hypotension likely 2/2 dehydration, UTI, and L subcapital femoral neck fx s/p percutaneous screw fixation 11/8. PMHx: CAD, combined diastolic and systolic HF, CKD stage III, HTN, HLD, and DM, gout.   OT comments  Pt received in supine, agreeable for OT visit. Grossly oriented x4, participatory. Functionally, pt needed mod A for most mobility and transfers today. Tolerated sitting EOB ~10' for various grooming tasks with CGA for safety/stability. Stood to [employer] with mod A with incr time for powering up, able to take few lateral steps towards HOB, benefits from seated rest break 2/2 pain. Pt is progressing well towards functional goals, will continue to follow.      If plan is discharge home, recommend the following:  Two people to help with walking and/or transfers;A lot of help with bathing/dressing/bathroom;Assistance with cooking/housework;Direct supervision/assist for medications management;Direct supervision/assist for financial management;Assist for transportation;Help with stairs or ramp for entrance;Supervision due to cognitive status   Equipment Recommendations  Other (comment) (defer)    Recommendations for Other Services      Precautions / Restrictions Precautions Precautions: Fall Recall of Precautions/Restrictions: Impaired Precaution/Restrictions Comments: low vision at baseline Restrictions Weight Bearing Restrictions Per Provider Order: Yes LLE Weight Bearing Per Provider Order: Weight bearing as tolerated       Mobility Bed Mobility Overal bed mobility: Needs Assistance Bed Mobility: Supine to Sit, Sit to Supine     Supine to sit: Mod assist, HOB elevated, Used  rails Sit to supine: Max assist, HOB elevated, Used rails   General bed mobility comments: assist for trunk elevation & BLE mgmt    Transfers Overall transfer level: Needs assistance Equipment used: Rolling walker (2 wheels) Transfers: Sit to/from Stand Sit to Stand: Mod assist, From elevated surface           General transfer comment: Stood with VC for hand placement, slow to come to stance; able to take few side steps towards [facility] with RW and assist for AD mgmt.     Balance Overall balance assessment: Needs assistance Sitting-balance support: Single extremity supported, Feet supported Sitting balance-Leahy Scale: Fair Sitting balance - Comments: seated EOB, CGA for safety/stability   Standing balance support: Bilateral upper extremity supported, During functional activity, Reliant on assistive device for balance Standing balance-Leahy Scale: Poor Standing balance comment: mod A to sustain in stance, reliant on BUE support via RW                           ADL either performed or assessed with clinical judgement   ADL Overall ADL's : Needs assistance/impaired     Grooming: Contact guard assist;Sitting;Wash/dry face;Oral care Grooming Details (indicate cue type and reason): seated EOB, cues to locate grooming items 2/2 low vision (baseline)             Lower Body Dressing: Total assistance               Functional mobility during ADLs: Moderate assistance;Rolling walker (2 wheels)      Extremity/Trunk Assessment              Vision       Perception     Praxis     Communication  Communication Communication: Impaired Factors Affecting Communication: Hearing impaired   Cognition Arousal: Alert Behavior During Therapy: Flat affect, WFL for tasks assessed/performed               OT - Cognition Comments: pt HoH and following 1-step commands with mild delay suspect 2/2 hearing impairment                 Following commands:  Impaired Following commands impaired: Follows one step commands inconsistently, Follows one step commands with increased time      Cueing   Cueing Techniques: Verbal cues, Tactile cues  Exercises      Shoulder Instructions       General Comments no family present, overall tolerated session well    Pertinent Vitals/ Pain       Pain Assessment Pain Assessment: Faces Faces Pain Scale: Hurts a little bit Pain Location: LLE Pain Descriptors / Indicators: Discomfort, Grimacing Pain Intervention(s): Limited activity within patient's tolerance, Monitored during session, Repositioned, Premedicated before session, Patient requesting pain meds-RN notified  Home Living                                          Prior Functioning/Environment              Frequency  Min 2X/week        Progress Toward Goals  OT Goals(current goals can now be found in the care plan section)  Progress towards OT goals: Progressing toward goals     Plan      Co-evaluation                 AM-PAC OT 6 Clicks Daily Activity     Outcome Measure   Help from another person eating meals?: A Little Help from another person taking care of personal grooming?: A Little Help from another person toileting, which includes using toliet, bedpan, or urinal?: Total Help from another person bathing (including washing, rinsing, drying)?: A Lot Help from another person to put on and taking off regular upper body clothing?: A Little Help from another person to put on and taking off regular lower body clothing?: Total 6 Click Score: 13    End of Session Equipment Utilized During Treatment: Gait belt;Rolling walker (2 wheels)  OT Visit Diagnosis: Unsteadiness on feet (R26.81);History of falling (Z91.81);Low vision, both eyes (H54.2);Other symptoms and signs involving cognitive function;Pain Pain - Right/Left: Left Pain - part of body: Leg   Activity Tolerance Patient tolerated  treatment well   Patient Left in bed;with call bell/phone within reach;with bed alarm set   Nurse Communication Mobility status        Time: 8878-8855 OT Time Calculation (min): 23 min  Charges: OT General Charges $OT Visit: 1 Visit OT Treatments $Self Care/Home Management : 23-37 mins  Imara Standiford M. [place], OTR/L [facility] Acute Rehabilitation Services [phone] Secure Chat Preferred  Zeven Kocak 04/15/2024, 4:01 PM

## 2024-04-15 NOTE — Progress Notes (Signed)
 PROGRESS NOTE  [name] FMW:[id number] DOB: March 29, 1931 DOA: 04/06/2024 PCP: Maree Leni Edyth DELENA, MD   LOS: 9 days   Brief narrative:  89 yo male with past medical history of coronary artery disease, heart failure, CKD and type 2 diabetes presented to the hospital after a mechanical fall. On the day of hospitalization, he was diagnosed with urinary tract infection by his primary care provider. Apparently at home he had not being eating well, very poor oral intake, and having hallucinations. At home sustained a mechanical fall while trying to put is clothes, leaned forward to far.  EMS was called, he was found with blood pressure 108/60, he was placed on 2 L/min per [place] with 02 saturation 95%, then transported to the ED. in the ED initial blood pressure was 64/48 and was put on norepinephrine.  CT cervical spine without any fracture.  CT chest abdomen pelvis did not show any acute findings.  CT head however showed thin subdural hematoma over the left frontal convexity.  Patient was then admitted hospital for further evaluation and treatment.  Assessment/Plan: Principal Problem:   Dehydration Active Problems:   HTN (hypertension), benign   CAD (coronary artery disease)   Chronic kidney disease, stage 3a (HCC)   UTI (urinary tract infection)   Gout   Type 2 diabetes mellitus with hyperlipidemia (HCC)   Subdural hematoma (HCC)   Depression   Obesity (BMI 30-39.9)  Left hip pain/subcapital left femoral neck fracture x-ray of hip with pelvis on left showed findings suggesting subcapital left femoral neck fracture.  CT hip showed impacted subcapital fracture.  Orthopedic was consulted.  Patient underwent cardiac evaluation by cardiology who did not recommend surgery.  Due to high cardiac risk.  Review of 2D echocardiogram showed LV ejection fraction of 60 to 65% with no regional wall motion abnormality.  Orthopedic surgery recommended cannulated screw fixation to stabilize the fracture for  pain control and mobility and patient underwent fixation of the neck fracture using the screw with orthopedics.  Plan is skilled nursing facility placement at this time.  No plan for DVT prophylaxis due to SDH   Subdural hematoma Repeat CT head scan without any changes.  Neurosurgery saw the patient and recommended conservative treatment.    Hypotension Patient initially initially had severe hypotension requiring vasopressors; likely in setting of dehydration.  Now resolved.  Off vasopressors.  No clinical signs of sepsis.   Hypertension Controlled on metoprolol  from home.   CKD stage IIIa Received IV fluids.  Creatinine latest at 1.1.  No recent labs  Urinary tract infection Urine culture from 04/06/2024 was negative.  Initially was on Rocephin  which has been discontinued   Diabetes mellitus type 2 Continue sliding scale insulin , Accu-Cheks, diabetic diet.  Latest POC glucose of 90  DVT prophylaxis: SCDs Start: 04/06/24 1528   Disposition: Plan for skilled nursing facility placement.  Medically stable for disposition.  Status is: Inpatient Remains inpatient appropriate because: Need for rehabilitation    Code Status:     Code Status: Full Code  Family Communication: None at bedside  Consultants: Orthopedics  Procedures: Percutaneous screw fixation of the left femoral neck fracture on 04/11/2024  Anti-infectives:  None   Subjective: Today, patient was seen and examined at bedside.  Patient denies interval complaints.  Denies overt pain nausea vomiting shortness of breath.  States that he has been eating okay has had bowel movement.  Denies any shortness of breath or cough.  Objective: Vitals:   04/15/24 0343 04/15/24  0739  BP: 120/63 124/75  Pulse: 78 84  Resp: 18 19  Temp: 98.8 F (37.1 C) 98.7 F (37.1 C)  SpO2: 98% 96%    Intake/Output Summary (Last 24 hours) at 04/15/2024 1105 Last data filed at 04/15/2024 0950 Gross per 24 hour  Intake 480 ml  Output  1125 ml  Net -645 ml   Filed Weights   04/10/24 0600 04/11/24 0521 04/13/24 0600  Weight: 85.7 kg 84.6 kg 87.1 kg   Body mass index is 27.55 kg/m.   Physical Exam: GENERAL: Patient is alert awake and oriented. Not in obvious distress.  On nasal cannula oxygen, elderly male deconditioned and weak. HENT: No scleral pallor or icterus. Pupils equally reactive to light. Oral mucosa is moist NECK: is supple, no gross swelling noted. CHEST: Clear to auscultation. No crackles or wheezes.   CVS: S1 and S2 heard, no murmur. Regular rate and rhythm.  ABDOMEN: Soft, non-tender, bowel sounds are present. EXTREMITIES: No edema.  Left hip with area of dressing. CNS: Cranial nerves are intact. No focal motor deficits. SKIN: warm and dry without rashes.  Data Review: I have personally reviewed the following laboratory data and studies,  CBC: No results for input(s): WBC, NEUTROABS, HGB, HCT, MCV, PLT in the last 168 hours. Basic Metabolic Panel: No results for input(s): NA, K, CL, CO2, GLUCOSE, BUN, CREATININE, CALCIUM , MG, PHOS in the last 168 hours. Liver Function Tests: No results for input(s): AST, ALT, ALKPHOS, BILITOT, PROT, ALBUMIN in the last 168 hours. No results for input(s): LIPASE, AMYLASE in the last 168 hours. No results for input(s): AMMONIA in the last 168 hours. Cardiac Enzymes: No results for input(s): CKTOTAL, CKMB, CKMBINDEX, TROPONINI in the last 168 hours. BNP (last 3 results) Recent Labs    04/06/24 1130 04/08/24 0300  BNP 33.6 125.7*    ProBNP (last 3 results) No results for input(s): PROBNP in the last 8760 hours.  CBG: Recent Labs  Lab 04/14/24 0608 04/14/24 1106 04/14/24 1633 04/14/24 1947 04/15/24 0802  GLUCAP 95 156* 140* 132* 90   Recent Results (from the past 240 hours)  Culture, blood (Routine x 2)     Status: None   Collection Time: 04/06/24 11:30 AM   Specimen: BLOOD RIGHT HAND   Result Value Ref Range Status   Specimen Description BLOOD RIGHT HAND  Final   Special Requests   Final    BOTTLES DRAWN AEROBIC AND ANAEROBIC Blood Culture adequate volume   Culture   Final    NO GROWTH 5 DAYS Performed at [facility], [address] [place], [place] [address]    Report Status 04/11/2024 FINAL  Final  Culture, blood (Routine x 2)     Status: None   Collection Time: 04/06/24 11:55 AM   Specimen: BLOOD  Result Value Ref Range Status   Specimen Description BLOOD LEFT ANTECUBITAL  Final   Special Requests   Final    BOTTLES DRAWN AEROBIC AND ANAEROBIC Blood Culture adequate volume   Culture   Final    NO GROWTH 5 DAYS Performed at [facility], [address], [place], [place] [address]    Report Status 04/11/2024 FINAL  Final  Urine Culture (for pregnant, neutropenic or urologic patients or patients with an indwelling urinary catheter)     Status: None   Collection Time: 04/06/24  2:50 PM   Specimen: Urine, Catheterized  Result Value Ref Range Status   Specimen Description URINE, CATHETERIZED  Final  Special Requests NONE  Final   Culture   Final    NO GROWTH Performed at [facility], [address], [place], [place] [address]    Report Status 04/07/2024 FINAL  Final  MRSA Next Gen by PCR, Nasal     Status: None   Collection Time: 04/06/24  3:28 PM   Specimen: Nasal Mucosa; Nasal Swab  Result Value Ref Range Status   MRSA by PCR Next Gen NOT DETECTED NOT DETECTED Final    Comment: (NOTE) The GeneXpert MRSA Assay (FDA approved for NASAL specimens only), is one component of a comprehensive MRSA colonization surveillance program. It is not intended to diagnose MRSA infection nor to guide or monitor treatment for MRSA infections. Test performance is not FDA approved in patients less than 88 years old. Performed at [facility], [address], [place], [place] [address]   Surgical PCR screen     Status: None   Collection Time:  04/10/24  2:02 AM   Specimen: Nasal Mucosa; Nasal Swab  Result Value Ref Range Status   MRSA, PCR NEGATIVE NEGATIVE Final   Staphylococcus aureus NEGATIVE NEGATIVE Final    Comment: (NOTE) The Xpert SA Assay (FDA approved for NASAL specimens in patients 94 years of age and older), is one component of a comprehensive surveillance program. It is not intended to diagnose infection nor to guide or monitor treatment. Performed at [facility], [address], [place], [place] [address]      Studies: No results found.    Chelci Winterm[place], MD  [facility] 04/15/2024  If 7PM-7AM, please contact night-coverage

## 2024-04-16 LAB — CBC
HCT: 29.8 % — ABNORMAL LOW (ref 39.0–52.0)
Hemoglobin: 9.6 g/dL — ABNORMAL LOW (ref 13.0–17.0)
MCH: 28.9 pg (ref 26.0–34.0)
MCHC: 32.2 g/dL (ref 30.0–36.0)
MCV: 89.8 fL (ref 80.0–100.0)
Platelets: 172 K/uL (ref 150–400)
RBC: 3.32 MIL/uL — ABNORMAL LOW (ref 4.22–5.81)
RDW: 13.9 % (ref 11.5–15.5)
WBC: 6.9 K/uL (ref 4.0–10.5)
nRBC: 0 % (ref 0.0–0.2)

## 2024-04-16 LAB — BASIC METABOLIC PANEL WITH GFR
Anion gap: 11 (ref 5–15)
BUN: 21 mg/dL (ref 8–23)
CO2: 28 mmol/L (ref 22–32)
Calcium: 8.7 mg/dL — ABNORMAL LOW (ref 8.9–10.3)
Chloride: 99 mmol/L (ref 98–111)
Creatinine, Ser: 1.12 mg/dL (ref 0.61–1.24)
GFR, Estimated: >60 mL/min (ref >60)
Glucose, Bld: 108 mg/dL — ABNORMAL HIGH (ref 70–99)
Potassium: 4.7 mmol/L (ref 3.5–5.1)
Sodium: 138 mmol/L (ref 135–145)

## 2024-04-16 LAB — MAGNESIUM: Magnesium: 1.8 mg/dL (ref 1.7–2.4)

## 2024-04-16 LAB — GLUCOSE, CAPILLARY: Glucose-Capillary: 91 mg/dL (ref 70–99)

## 2024-04-16 NOTE — Progress Notes (Incomplete)
   04/16/24 0000  Intake (mL)  P.O. 240 mL  Urine Measurement/Characteristics  Bladder Scan Volume (mL) 17 mL   Pt has not yet voided. Bladder scan completed. Pt denies having an urge to urinate. Dr. Charlton was made aware.

## 2024-04-16 NOTE — TOC Transition Note (Signed)
 Transition of Care [facility]) - Discharge Note   Patient Details  Name: [name] MRN: [id number] Date of Birth: [birth year]  [facility] CM/SW Contact:  [name], LCSW Phone Number: 04/16/2024, 10:01 AM   Clinical Narrative:   CSW updated by RN that patient has voided. CSW confirmed with [facility] that they are still ready for patient to admit. Updated DC summary sent to [facility]. Transport arranged with [facility] for next available. CSW updated patient's son, [name], he is in agreement. Nurse previously called report yesterday, [facility] does not need report called again. No further inpatient care management needs.    Final next level of care: Skilled Nursing Facility Barriers to Discharge: Barriers Resolved   Patient Goals and CMS Choice            Discharge Placement              Patient chooses bed at: [facility] Patient to be transferred to facility by: [facility] Name of family member notified: [name] Patient and family notified of of transfer: 04/16/24  Discharge Plan and Services Additional resources added to the After Visit Summary for                                       Social Drivers of Health (SDOH) Interventions SDOH Screenings   Food Insecurity: No Food Insecurity (04/07/2024)  Housing: Low Risk  (04/07/2024)  Transportation Needs: No Transportation Needs (04/07/2024)  Utilities: Not At Risk (04/07/2024)  Financial Resource Strain: Medium Risk (02/05/2018)  Physical Activity: Unknown (02/06/2018)  Social Connections: Socially Isolated (04/07/2024)  Stress: No Stress Concern Present (02/06/2018)  Tobacco Use: Medium Risk (04/11/2024)     Readmission Risk Interventions     No data to display

## 2024-04-16 NOTE — Progress Notes (Signed)
 Patient discharged to [facility] via [facility].  IV discontinued.  Patient is at baseline  alert and oriented X3.  Oxygen 2L via nasal cannula en route to [place].

## 2024-04-21 NOTE — Anesthesia Postprocedure Evaluation (Signed)
 Anesthesia Post Note  Patient: [name]  Procedure(s) Performed: FIXATION, FEMUR, NECK, PERCUTANEOUS, USING SCREW (Left: Hip)     Patient location during evaluation: PACU Anesthesia Type: General Level of consciousness: patient cooperative Pain management: pain level controlled Vital Signs Assessment: post-procedure vital signs reviewed and stable Respiratory status: spontaneous breathing, nonlabored ventilation and respiratory function stable Cardiovascular status: blood pressure returned to baseline and stable Postop Assessment: no apparent nausea or vomiting Anesthetic complications: no   No notable events documented.                Guhan Bruington
